# Patient Record
Sex: Male | Born: 1935 | Race: White | Hispanic: No | Smoking: Former smoker
Health system: Southern US, Community
[De-identification: ages and names within clinical notes are randomized; demographics above are authoritative.]

## PROBLEM LIST (undated history)

## (undated) DIAGNOSIS — G4733 Obstructive sleep apnea (adult) (pediatric): Secondary | ICD-10-CM

## (undated) DIAGNOSIS — I350 Nonrheumatic aortic (valve) stenosis: Secondary | ICD-10-CM

## (undated) DIAGNOSIS — R42 Dizziness and giddiness: Secondary | ICD-10-CM

## (undated) DIAGNOSIS — R5383 Other fatigue: Secondary | ICD-10-CM

## (undated) DIAGNOSIS — J309 Allergic rhinitis, unspecified: Secondary | ICD-10-CM

## (undated) DIAGNOSIS — Z9981 Dependence on supplemental oxygen: Secondary | ICD-10-CM

## (undated) DIAGNOSIS — K625 Hemorrhage of anus and rectum: Secondary | ICD-10-CM

## (undated) DIAGNOSIS — J449 Chronic obstructive pulmonary disease, unspecified: Secondary | ICD-10-CM

## (undated) DIAGNOSIS — E119 Type 2 diabetes mellitus without complications: Secondary | ICD-10-CM

## (undated) DIAGNOSIS — E1140 Type 2 diabetes mellitus with diabetic neuropathy, unspecified: Secondary | ICD-10-CM

## (undated) DIAGNOSIS — F419 Anxiety disorder, unspecified: Secondary | ICD-10-CM

## (undated) DIAGNOSIS — I509 Heart failure, unspecified: Secondary | ICD-10-CM

## (undated) DIAGNOSIS — C61 Malignant neoplasm of prostate: Secondary | ICD-10-CM

## (undated) DIAGNOSIS — I10 Essential (primary) hypertension: Secondary | ICD-10-CM

## (undated) DIAGNOSIS — G4700 Insomnia, unspecified: Secondary | ICD-10-CM

## (undated) DIAGNOSIS — E785 Hyperlipidemia, unspecified: Secondary | ICD-10-CM

## (undated) DIAGNOSIS — I4891 Unspecified atrial fibrillation: Secondary | ICD-10-CM

## (undated) DIAGNOSIS — F319 Bipolar disorder, unspecified: Secondary | ICD-10-CM

## (undated) DIAGNOSIS — M109 Gout, unspecified: Secondary | ICD-10-CM

## (undated) DIAGNOSIS — D649 Anemia, unspecified: Secondary | ICD-10-CM

## (undated) DIAGNOSIS — E669 Obesity, unspecified: Secondary | ICD-10-CM

## (undated) DIAGNOSIS — C182 Malignant neoplasm of ascending colon: Secondary | ICD-10-CM

## (undated) HISTORY — DX: Obesity, unspecified: E66.9

## (undated) HISTORY — DX: Dizziness and giddiness: R42

## (undated) HISTORY — PX: PROSTATE BIOPSY: SHX241

## (undated) HISTORY — PX: KNEE SURGERY: SHX244

## (undated) HISTORY — DX: Nonrheumatic aortic (valve) stenosis: I35.0

## (undated) HISTORY — DX: Type 2 diabetes mellitus without complications: E11.9

## (undated) HISTORY — DX: Essential (primary) hypertension: I10

## (undated) HISTORY — DX: Hyperlipidemia, unspecified: E78.5

## (undated) HISTORY — PX: CARDIOVERSION: SHX1299

## (undated) HISTORY — DX: Chronic obstructive pulmonary disease, unspecified: J44.9

## (undated) HISTORY — DX: Bipolar disorder, unspecified: F31.9

## (undated) HISTORY — DX: Obstructive sleep apnea (adult) (pediatric): G47.33

## (undated) HISTORY — DX: Anxiety disorder, unspecified: F41.9

## (undated) HISTORY — DX: Insomnia, unspecified: G47.00

## (undated) HISTORY — DX: Type 2 diabetes mellitus with diabetic neuropathy, unspecified: E11.40

## (undated) HISTORY — DX: Unspecified atrial fibrillation: I48.91

## (undated) HISTORY — DX: Allergic rhinitis, unspecified: J30.9

## (undated) HISTORY — PX: SHOULDER SURGERY: SHX246

---

## 1967-02-02 HISTORY — PX: OTHER SURGICAL HISTORY: SHX169

## 1989-02-01 DIAGNOSIS — I4891 Unspecified atrial fibrillation: Secondary | ICD-10-CM

## 1989-02-01 HISTORY — DX: Unspecified atrial fibrillation: I48.91

## 1996-02-02 HISTORY — PX: MAZE: SHX5063

## 1999-08-14 ENCOUNTER — Encounter: Payer: Self-pay | Admitting: Specialist

## 1999-08-17 ENCOUNTER — Encounter (INDEPENDENT_AMBULATORY_CARE_PROVIDER_SITE_OTHER): Payer: Self-pay | Admitting: Specialist

## 1999-08-17 ENCOUNTER — Observation Stay (HOSPITAL_COMMUNITY): Admission: RE | Admit: 1999-08-17 | Discharge: 1999-08-18 | Payer: Self-pay | Admitting: Specialist

## 2003-05-22 ENCOUNTER — Encounter: Admission: RE | Admit: 2003-05-22 | Discharge: 2003-05-22 | Payer: Self-pay | Admitting: Internal Medicine

## 2003-08-28 ENCOUNTER — Ambulatory Visit (HOSPITAL_BASED_OUTPATIENT_CLINIC_OR_DEPARTMENT_OTHER): Admission: RE | Admit: 2003-08-28 | Discharge: 2003-08-28 | Payer: Self-pay | Admitting: Internal Medicine

## 2005-07-21 ENCOUNTER — Encounter: Admission: RE | Admit: 2005-07-21 | Discharge: 2005-07-21 | Payer: Self-pay | Admitting: Internal Medicine

## 2006-02-18 ENCOUNTER — Ambulatory Visit: Payer: Self-pay | Admitting: Internal Medicine

## 2006-09-20 ENCOUNTER — Ambulatory Visit: Payer: Self-pay | Admitting: Internal Medicine

## 2006-10-07 ENCOUNTER — Ambulatory Visit: Payer: Self-pay | Admitting: Internal Medicine

## 2007-03-13 DIAGNOSIS — I482 Chronic atrial fibrillation, unspecified: Secondary | ICD-10-CM | POA: Insufficient documentation

## 2007-03-13 DIAGNOSIS — G4733 Obstructive sleep apnea (adult) (pediatric): Secondary | ICD-10-CM

## 2007-03-13 DIAGNOSIS — J449 Chronic obstructive pulmonary disease, unspecified: Secondary | ICD-10-CM

## 2007-03-13 DIAGNOSIS — E119 Type 2 diabetes mellitus without complications: Secondary | ICD-10-CM | POA: Insufficient documentation

## 2007-03-15 ENCOUNTER — Encounter: Payer: Self-pay | Admitting: Internal Medicine

## 2007-09-14 ENCOUNTER — Encounter: Payer: Self-pay | Admitting: Internal Medicine

## 2009-01-02 ENCOUNTER — Telehealth (INDEPENDENT_AMBULATORY_CARE_PROVIDER_SITE_OTHER): Payer: Self-pay | Admitting: *Deleted

## 2009-01-08 ENCOUNTER — Ambulatory Visit: Payer: Self-pay | Admitting: Internal Medicine

## 2009-01-08 DIAGNOSIS — G4730 Sleep apnea, unspecified: Secondary | ICD-10-CM | POA: Insufficient documentation

## 2009-01-16 ENCOUNTER — Encounter: Payer: Self-pay | Admitting: Internal Medicine

## 2009-01-21 ENCOUNTER — Encounter: Payer: Self-pay | Admitting: Internal Medicine

## 2009-01-21 ENCOUNTER — Ambulatory Visit (HOSPITAL_BASED_OUTPATIENT_CLINIC_OR_DEPARTMENT_OTHER): Admission: RE | Admit: 2009-01-21 | Discharge: 2009-01-21 | Payer: Self-pay | Admitting: Internal Medicine

## 2009-02-01 ENCOUNTER — Ambulatory Visit: Payer: Self-pay | Admitting: Internal Medicine

## 2009-06-23 ENCOUNTER — Telehealth (INDEPENDENT_AMBULATORY_CARE_PROVIDER_SITE_OTHER): Payer: Self-pay | Admitting: *Deleted

## 2009-06-24 ENCOUNTER — Ambulatory Visit: Payer: Self-pay | Admitting: Internal Medicine

## 2009-06-24 DIAGNOSIS — J309 Allergic rhinitis, unspecified: Secondary | ICD-10-CM | POA: Insufficient documentation

## 2009-08-12 ENCOUNTER — Telehealth: Payer: Self-pay | Admitting: Internal Medicine

## 2009-11-07 ENCOUNTER — Encounter: Admission: RE | Admit: 2009-11-07 | Discharge: 2009-11-07 | Payer: Self-pay | Admitting: Internal Medicine

## 2010-02-01 DIAGNOSIS — I350 Nonrheumatic aortic (valve) stenosis: Secondary | ICD-10-CM

## 2010-02-01 HISTORY — DX: Nonrheumatic aortic (valve) stenosis: I35.0

## 2010-02-22 ENCOUNTER — Encounter: Payer: Self-pay | Admitting: Internal Medicine

## 2010-03-03 NOTE — Progress Notes (Signed)
Summary: can't wait for appt  Phone Note Call from Patient Call back at [phone]   Caller: Spouse- Lerry Liner Reason for Call: Talk to Nurse Summary of Call: pt dizzy, complains alot about the pollen, nausea.  Has had complete heart w/u.  Pt's wife doesn't feel he can wait until appt tomorrow. Initial call taken by: Eugene Gavia,  Jun 23, 2009 12:03 PM  Follow-up for Phone Call        [facility] x 1 Zackery B[facility]  Jun 23, 2009 12:19 PM   Pt seen today.Reynaldo Minium CMA  Jun 24, 2009 2:42 PM

## 2010-03-03 NOTE — Progress Notes (Signed)
Summary: Please order new BiPAP machine  Phone Note Other Incoming   Summary of Call: Need relacement script BIPAP 18/12, which is where he is still comfortable. Initial call taken by: Waymon Budge MD,  August 12, 2009 7:25 PM  Follow-up for Phone Call        Order p/u by Mayra Reel for [facility] for replacement bipap 18/12. Pt is aware. Rhonda Cobb  August 15, 2009 5:23 PM     New/Updated Medications: * BIPAP 18 / 12 ADVANCED

## 2010-03-03 NOTE — Assessment & Plan Note (Signed)
Summary: ALLERGY PROBLEM/ MBW   CC:  Allergies-cough-brown in color; sneezing, wheezing, SOB, nausea, and ? vertigo(dizzy)..  History of Present Illness: January 08, 2009- OSA, Chronic bronchitis, rhinitis He gets chest congestion- worse in Spring. This Fall he has been congested, coughing. No relief from zpak then amoxacillin. Yellow brown mucus. Hard to cough it out. Using mucinex DM. Prednisone overstimulates. Not using any inhalers. Long talk about Wife's illness. OSA- BiPAP18/14. Advanced. he needs a new sleep study because of weight gain and to document diagnosis and pressure recommendation. Had flu vax. Had a single pneumovax years ago  Jun 24, 2009- OSA, Chronic bronchitis, rhinitis..............................Marland Kitchenwife here 1 month head congestion, nasal congestion, wheeze and sneeze, brownish sputum and some episodic vertigo. Sore throat without fever. Anterior chest pain- he saw Dr Ty Hilts for cardiology with negative workup.-> "chest wall pain". We reiewed his sleep study from last December- confirmed severe OSA 102/hr, with titration to I21/ E 12. He is comfortable still with BiPAP I 18/ E 12. He uses it every night and sleeps 10 hours. His wife confirms no snore through.  Skin itches without rash.  Preventive Screening-Counseling & Management  Alcohol-Tobacco     Smoking Status: quit > 6 months  Current Medications (verified): 1)  Metformin Hcl 500 Mg  Tabs (Metformin Hcl) 2)  Glyburide 5 Mg  Tabs (Glyburide) .... Ck Dosing With Pt 3)  Diovan 160 Mg  Tabs (Valsartan) .... Take 1 By Mouth Once Daily 4)  Lasix 40 Mg  Tabs (Furosemide) .... Take 1 By Mouth Once Daily 5)  K-Dur 6)  Adult Aspirin Low Strength 81 Mg  Tbdp (Aspirin) .... Take 1 By Mouth Once Daily 7)  Multivitamins   Tabs (Multiple Vitamin) .... Take 1 By Mouth Once Daily 8)  Qvar 80 Mcg/act  Aers (Beclomethasone Dipropionate) .Marland Kitchen.. 1 Puff Two Times A Day 9)  Bi Pap [place] .... Set On 18/ 14 10)  Ambien .... As  Needed Ck Dosing With Pt 11)  Lexapro 10 Mg Tabs (Escitalopram Oxalate) .... Take 1 By Mouth Once Daily 12)  Lovastatin 20 Mg Tabs (Lovastatin) .... 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  Risk Factors: Smoking Status: quit > 6 months (06/24/2009)  Past Medical History: COPD- FEV1/FVC 0.68 08/15/96 Obstructive Sleep Apnea- Severe- 01/22/09-AHI 102.8/hr Hx A Fib/ multiple cardioversions, Maze procedure Diabetes, Type 2 Hx Bipolar/ Hosp/ ECT  Past Surgical History: Dental extraction Maze procedure for control of atrial fib Right knee surgery Right shoulder  Social History: Married AA x 33 years 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  The patient denies shortness of breath with activity, shortness of breath at rest, productive cough, non-productive cough, coughing up blood, chest pain, irregular heartbeats, acid heartburn, indigestion, loss of appetite, weight change, abdominal pain, difficulty swallowing, sore throat, tooth/dental problems, and headaches.    Vital Signs:  Patient profile:   77 year old male Weight:      275 pounds O2 Sat:      96 % on Room air Pulse rate:   74 / minute BP sitting:   144 / 82  (left arm) Cuff  size:   large  Vitals Entered By: Reynaldo Minium CMA (Jun 24, 2009 1:57 PM)  O2 Flow:  Room air  Physical Exam  Additional Exam:  General: A/Ox3; pleasant and cooperative, NAD, obese  SKIN: no rash, lesions NODES: no lymphadenopathy HEENT: [place]/AT, EOM- WNL, Conjuctivae- clear, PERRLA, TM-WNL, Nose- clear, Throat- clear and wnl, Mallampati IV, dentures NECK: Supple w/ fair ROM, JVD- none, normal carotid  impulses w/o bruits Thyroid- normal to palpation CHEST: No wheeze HEART: RRR, no m/g/r heard ABDOMEN: Soft and nl; ZOX:WRUE, nl pulses, no edema  NEURO: Grossly intact to observation, fidgety      Impression & Recommendations:  Problem # 1:  ALLERGIC RHINITIS (ICD-477.9)  We discusseed his diabetes and decided to accept the sugar surge of a depo shot to get good control of rhinitis now. We also discussed available antihistamines.  Problem # 2:  SLEEP APNEA (ICD-780.57) We reviewed his last sleep study demonstrating his severe sleep apnea. He is very compliant and well controlled with bipap. we will leave the pressure where it is.  Other Orders: Est. Patient Level IV (45409) Admin of Therapeutic Inj  intramuscular or subcutaneous (81191) Depo- Medrol 80mg  (J1040) Nebulizer Tx (47829)  Patient Instructions: 1)  Please schedule a follow-up appointment in 1 year. 2)  neb neo nasal 3)  depo 80 4)  continue BiPAP at 18/14 5)  Consider otc antihistamines 6)  Claritin/ loratadine 7)  Allegra/ fexofenadine      Medication Administration  Injection # 1:    Medication: Depo- Medrol 80mg     Diagnosis: ALLERGIC RHINITIS (ICD-477.9)    Route: SQ    Site: LUOQ gluteus    Exp Date: 12/2011    Lot #: [id number]    Mfr: Pharmacia    Patient tolerated injection without complications    Given by: Reynaldo Minium CMA (Jun 24, 2009 2:42 PM)  Medication # 1:    Medication: EMR miscellaneous medications    Diagnosis: ALLERGIC RHINITIS (ICD-477.9)    Dose: 3drops    Route: intranasal    Exp Date: 11/2010    Lot #: [id number]    Mfr: bayer    Comments: neo-synephrine    Patient tolerated medication without complications    Given by: Reynaldo Minium CMA (Jun 24, 2009 2:43 PM)  Orders Added: 1)  Est. Patient Level IV [08657] 2)  Admin of Therapeutic Inj  intramuscular or subcutaneous [96372] 3)  Depo- Medrol 80mg  [J1040] 4)  Nebulizer Tx [84696]

## 2010-06-16 NOTE — Assessment & Plan Note (Signed)
[facility]                             PULMONARY OFFICE NOTE   CORNELLIUS, Joel [name]                       MRN:          [id number]  DATE:09/20/2006                            DOB:          1932/10/07    PROBLEM:  1. Obstructive sleep apnea.  2. Chronic bronchitis.  3. Diabetes.   HISTORY:  He feels he and his wife have been trading a congestion cough  back and forth. He had been treated by Dr.  Donette Larry. Sputum was scant  yellow with no fever. Nebulized albuterol helps. Mucinex maybe does not.  He quit walking and complains today mainly of nasal congestion without  pain, blood or purulent discharge currently. He says Dr.  Donette Larry did a  chest x-ray-fine. He denies any sense of heartburn or reflux.   MEDICATIONS:  1. Metformin 500 mg.  2. Glyburide.  3. Diovan 160.  4. Lasix 40 mg.  5. Potassium.  6. Aspirin 81 mg.  7. He uses Bi-PAP inspiratory 18 and expiratory 14.   DRUG INTOLERANCES:  No medication allergy.   OBJECTIVE:  Weight 282 pounds, blood pressure 128/58, pulse 103.  Room  air saturation 94%. This is an obese man with regular heart sounds and  raspy cough, but no wheeze and no edema. No adenopathy.   IMPRESSION:  Rhinitis and tracheal bronchitis with history of  gastroesophageal reflux disease.   PLAN:  1. Reflux precautions.  2. Sample of Veramyst one or two sprays each nostril once daily.  3. Qvar 80 two puffs b.i.d. rinse mouth.  4. Prilosec over-the-counter.  5. Schedule return in three weeks or one year. Come earlier p.r.n.    Joel D. Maple Hudson, MD, Tonny Bollman, FACP  Electronically Signed   CDY/MedQ  DD: 09/21/2006  DT: 09/22/2006  Job #: [id number]   cc:   Georgann Housekeeper, MD

## 2010-06-16 NOTE — Assessment & Plan Note (Signed)
[facility]                             PULMONARY OFFICE NOTE   NAME:STUTTSNihal, Marzella                       MRN:          [id number]  DATE:10/07/2006                            DOB:          [birth year]    PROBLEMS:  1. Obstructive sleep apnea.  2. Chronic bronchitis.  3. Diabetes.  4. History of atrial fibrillation.   HISTORY:  He says that he is depressed, is tired, and has no energy, but  he insists that he is comfortable with BiPAP at 18/14 CWP. He had a  cough bronchitis syndrome which he says is slowly breaking up, and his  nebulized albuterol does help. Bedtime is around 11. He is up around  5:30am. Ambien helps. It worries him some that he feels he needs it  every night giving a past history of ethanol and diazepam dependency,  and a past history of bipolar disorder. We discussed this and we  discussed alternatives. He says that he wakes tired with no energy. Naps  are not especially refreshing.   MEDICATIONS:  1. Metformin 500 mg.  2. Glyburide.  3. Diovan 160 mg.  4. Lasix 40 mg.  5. Potassium.  6. Multivitamin.  7. Aspirin 81 mg.  8. BiPAP 18/14.  9. Home nebulizer with albuterol.  10.Generic Ambien.  11.Veramyst once each nostril daily.  12.Qvar 80 1 puff b.i.d.   Some days he will use his albuterol as much as three times daily. He  wants a rescue inhaler and we discussed this.   OBJECTIVE:  Weight 283 pounds, pulse 70, room air saturation 95%. He is  quite obese. Loose cough without dullness or rhonchi. Heart sounds are  regular without murmur. There is no edema. No tremor. No pressure marks  on his face from the mask.   IMPRESSION:  Depression/fatigue. Otherwise, I do not get clues that his  sleep apnea control is inadequate. This may be residual sleepiness or  all related to depression. We have discussed sleep hygiene again and  realistic expectations emphasizing the importance of follow up with Dr.  Donette Larry.   PLAN:  1. He will continue his respiratory medications.  2. He is given albuterol rescue inhaler 2 puffs q.i.d. p.r.n.  3. Try Provigil 200 mg samples and prescription 1 daily as discussed      very carefully with him, p.r.n.  4. Schedule return 3 months, earlier p.r.n.     Clinton D. Maple Hudson, MD, Tonny Bollman, FACP  Electronically Signed    CDY/MedQ  DD: 10/08/2006  DT: 10/09/2006  Job #: [id number]   cc:   Georgann Housekeeper, MD

## 2010-06-17 ENCOUNTER — Encounter: Payer: Self-pay | Admitting: Internal Medicine

## 2010-06-19 NOTE — H&P (Signed)
[facility]  Patient:    Joel [name], Joel [name]                         MRN: [id number] Adm. Date:  08/17/99 Attending:  Philips J. Montez Morita, M.D. CC:         Lum Babe, M.D.                         History and Physical  DATE OF BIRTH:  10/25/1932  CHIEF COMPLAINT:  "Pain in my right shoulder."  HISTORY OF PRESENT ILLNESS:  This 77 year old white male is seen by [place] for continuing problems concerning his right shoulder.  The patient was to have had  surgery done in June of this year; however, he had to be cancelled, due to the act that he had atrial fibrillation.  A [place] procedure was done at [facility] for the fibrillation, and he has done quite well since then.  He is now scheduled for a distal clavicle resection of the right shoulder.  The patient has failed ith conservative measures, including injections to this area.  He has nearly constant persistent pain.  Dr. Lum Babe has cleared him for surgery.  PAST MEDICAL HISTORY:  Other than the thoracotomy, with myocardial ablation for  atrial fibrillation, the patient has had no previous surgeries.  Sleep apnea.  CURRENT MEDICATIONS: 1. Celebrex. 2. He does have sleep apnea and uses a CPAP machine.  ALLERGIES:  No known drug allergies.  SOCIAL HISTORY:  He neither smokes nor drinks.  FAMILY HISTORY:  Noncontributory.  REVIEW OF SYSTEMS:  CNS:  No seizure disorder, paralysis, numbness, or double vision.  RESPIRATORY:  No productive cough, no hemoptysis, no shortness of breath. CARDIOVASCULAR:  No chest pain, no angina, no orthopnea.  He does have sleep apnea. GASTROINTESTINAL:  No nausea, vomiting, melena, or bloody stools. GENITOURINARY: No discharge, no dysuria, no hematuria.  MUSCULOSKELETAL:  Primarily in the history of present illness, in the right shoulder, with pain in range of motion, specifically on extension.  PHYSICAL EXAMINATION:  GENERAL:  Alert, cooperative,  friendly, obese 77 year old white male.  VITAL SIGNS:  Blood pressure 140/80, pulse 72, respirations 12.  HEENT:  Normocephalic.  Pupils equal, round, reactive to light and accommodation. Oropharynx clear.  CHEST:  Clear to auscultation.  No rhonchi or rales.  HEART:  A regular rate and rhythm.  No murmurs are heard.  ABDOMEN:  Soft, nontender, obese.  Liver and spleen not felt.  GENITOURINARY/RECTAL:  Not done, not pertinent to the present illness.  EXTREMITIES:  The patient has good neurovascular function of the right upper extremity.  Tenderness to palpation over the [facility] joint.  He constantly moves the  right shoulder, to relieve the [facility] arthrosis discomfort.  ADMISSION DIAGNOSES: 1. Right shoulder acromioclavicular joint arthritis. 2. Sleep apnea. 3. History of a thoracotomy for atrial fibrillation ablation.  PLAN:  The patient will undergo a right distal clavicle resection.  We decided o do him as an inpatient, due to his sleep apnea and his cardiac history.  He has  been cleared by Dr. Demetrius Revel. DD:  08/14/99 TD:  08/14/99 Job: [id number]

## 2010-06-19 NOTE — Assessment & Plan Note (Signed)
[facility]                             PULMONARY OFFICE NOTE   KRISTINA, BERTONE                       MRN:          [id number]  DATE:02/18/2006                            DOB:          [birth year]    PULMONARY/SLEEP MEDICINE FOLLOWUP   PROBLEMS:  1. Obstructive sleep apnea.  2. Chronic bronchitis.  3. Diabetes.   HISTORY:  Mr. Abruzzo is a patient from the [facility] practice, coming now to establish here for continuity followup  of his sleep apnea.  He was originally diagnosed in 1993 with an apnea  hypopnea index of 28 per hour and for most of the years since has been  dependent on BiPAP, which has been at an inspiratory pressure of 18,  expiratory pressure of 14.  Home care company is [facility].  He says that this  pressure works well and he uses it every night.  He had not had adequate  control with CPAP.  In the past month, he has had congestion of nose and  chest with watering eyes.  He took some leftover amoxicillin, then took  10 days of Avelox for a diagnosis of bronchitis and pneumonia.  Antibiotics ended 5 days ago.  He was given Phenergan with codeine, but  he states he is a drug addict and does not want to take anything with  codeine, so he is using Delsym.  He denies fever.  His wife has not been  acutely ill.  He thinks the trigger was exposure on a sustained basis to  second hand smoke just before Christmas.  He describes pushing himself  hard at a gymnasium where he lifts weights, and he got too tired out  after that exposure.  He also walks at 1 to 2 miles a day.  He has not  had chest pain or purulent sputum, and what he coughs up is white.   MEDICATIONS:  1. Metformin 500 mg.  2. Glyburide 1/2 tablet.  3. Diovan 160 mg.  4. Lasix 40 mg.  5. Potassium.  6. Multivitamins.  7. Aspirin 81 mg.  8. BiPAP 18/14.   No medication allergy.   OBJECTIVE:  Weight 264 pounds, BP 148/74, pulse regular 79, room  air  saturation 96%.  HEART:  Sounds are regular.  There is no murmur.  No neck vein  distension and no edema.  He has a congested wheezy cough without dullness.   IMPRESSION:  1. Stable control of sleep apnea with BiPAP at 18/14 ([facility]).  2. Acute bronchitis.   PLAN:  1. He is given a nebulizer treatment here, Xopenex 1.25 mg.  2. Depo-Medrol 80 mg IM.  3. Prednisone 8 day taper from 40 mg.  4. A careful discussion of steroid side effects especially in the      context of his diabetes.  5. Chest x-ray.  6. Schedule return in 3 weeks, earlier p.r.n.     Clinton D. Maple Hudson, MD, Tonny Bollman, FACP  Electronically Signed    CDY/MedQ  DD: 02/20/2006  DT: 02/20/2006  Job #: [id number]   cc:  Georgann Housekeeper, MD

## 2010-06-19 NOTE — Procedures (Signed)
NAME:  Joel [name], Joel [name]              ACCOUNT NO.:  [id number]   MEDICAL RECORD NO.:  [id number]          PATIENT TYPE:  OUT   LOCATION:  SLEEP CENTER                 FACILITY:  [facility]   PHYSICIAN:  Joel [name], M.D. DATE OF BIRTH:  [birth year]   DATE OF ADMISSION:  08/28/2003  DATE OF DISCHARGE:  08/28/2003                              NOCTURNAL POLYSOMNOGRAM   REFERRING PHYSICIAN:  Clinton D. Maple [name], M.D.   INDICATION FOR STUDY/HISTORY:  Hypersomnia with sleep apnea.  BiPAP  titration requested to confirm best setting.  He has been using inspiratory  pressure of 7, expiratory pressure of 5 following initial diagnostic NPSG on  March 12, 1991 when RDI was 28/hr.   EPWORTH SLEEPINESS SCORE:  10/24   BODY MASS INDEX:  32   WEIGHT:  260 pounds   MEDICATIONS INCLUDE:  Diovan, K-Dur, glucosamine, furosemide, Ambien, and  multivitamins.   SLEEP ARCHITECTURE:  Sleep onset was at 10:10 p.m.  Total sleep time 437  minutes for a sleep efficiency of 93%.  Stage I was 5%, Stage II 80%, Stages  III and IV were absent, REM was 15% of total sleep time.  Latency to sleep  onset 8 minutes, latency to REM 86 minutes, awake after sleep onset 24  minutes, arousal index 10.6/hr.   RESPIRATORY DATA:  Titration was started with inspiratory pressure of 7,  expiratory pressure of 5 obtaining an RDI at 4.3/hr with maximum RDI during  the study of 75.8/hr and eventual control at pressure of 18/14 with an RDI  of 5.7/hr which was said to give good toleration and control using a large  full face ResMed mask with heated humidifier.   OXYGEN DATA:  Baseline room air oxygen saturation while awake was 94%.  Oxygen saturation was well controlled with a nadir of 80% before full BiPAP  control and a mean oxygen saturation through the study between 93 and 94%.  Snoring was eliminated.   CARDIAC DATA:  Normal sinus rhythm with occasional PVC.   MOVEMENT/PARASOMNIA:  Very frequent body jerks, 644 recorded,  but only 1 of  these was associated with sleep disturbance.   IMPRESSION/RECOMMENDATION:  Obstructive sleep apnea/hypopnea syndrome with  control using bilevel positive airway pressure inspiratory pressure  18/expiratory pressure 14, Respiratory Disturbance Index 5.7/hr which  eliminated snoring, controlled oxygenation and was well tolerated.                                   ______________________________                                Joel [name], M.D.                                Diplomate, American Board of Sleep Medicine    CDY/MEDQ  D:  09/01/2003 13:16:09  T:  09/02/2003 08:22:45  Job:  [id number]

## 2010-06-19 NOTE — Op Note (Signed)
[facility]  Patient:    KHYRIE, MASI                         MRN: [id number] Proc. Date: 08/17/99 Attending:  Philips J. Montez Morita, M.D.                           Operative Report  PREOPERATIVE DIAGNOSIS:  Right distal clavicle arthritis.  POSTOPERATIVE DIAGNOSIS:  Right distal clavicle arthritis.  OPERATIVE PROCEDURE:  Distal clavicle excision.  SURGEON:  Philips J. Montez Morita, M.D.  PROCEDURE:  After suitable general anesthesia, he was positioned in the shoulder frame and prepped and draped routine.  A linear incision was made over the distal end of the clavicle and the soft tissue was freed up anteriorly and posteriorly into the [facility] joint and using a saw, a half inch of the distal clavicle was excised.  Bone wax was used on the ends and a Gelfoam pack soaked in 0.5% Marcaine was placed in the interval and then the sutures reapproximating the periosteum and muscle of #1 PCV, ______ material in the subcu and a Vicryl 4-0 subcuticular closure with Steri-Strips.  Wound was quite dry at the end.  A Bovie was used throughout.  He was put in a sling and goes to recovery in good condition. DD:  08/17/99 TD:  08/17/99 Job: [id number]

## 2010-06-23 ENCOUNTER — Ambulatory Visit: Payer: Self-pay | Admitting: Internal Medicine

## 2010-07-10 ENCOUNTER — Telehealth: Payer: Self-pay | Admitting: Internal Medicine

## 2010-07-10 NOTE — Telephone Encounter (Signed)
Called spoke with patient who states that bipap had stopped working last night and when he went to [facility] today and got a new bipap but was told he needs an order for this.  Pt is also overdue for follow up with CDY - appt scheduled 6.11.12 @ 1430.  Pt will discuss this further with CDY at appt.

## 2010-07-13 ENCOUNTER — Encounter: Payer: Self-pay | Admitting: Internal Medicine

## 2010-07-13 ENCOUNTER — Ambulatory Visit (INDEPENDENT_AMBULATORY_CARE_PROVIDER_SITE_OTHER): Payer: Medicare Other | Admitting: Internal Medicine

## 2010-07-13 VITALS — BP 144/82 | HR 95 | Ht 73.0 in | Wt 269.8 lb

## 2010-07-13 DIAGNOSIS — G4733 Obstructive sleep apnea (adult) (pediatric): Secondary | ICD-10-CM

## 2010-07-13 DIAGNOSIS — J42 Unspecified chronic bronchitis: Secondary | ICD-10-CM

## 2010-07-13 NOTE — Patient Instructions (Signed)
Continue CPAP at 18/14  Consider trying Biotene - otc- as a way to keep your mouth less dry

## 2010-07-13 NOTE — Progress Notes (Signed)
  Subjective:    Patient ID: MAALIK PINN, male    DOB: 1932-05-22, 77 y.o.   MRN: [id number]  HPI 07/13/10- OSA Last here 06/24/09 His BiPAP machine had done well till it failed. Advanced BIPAP 18/14. We had gotten an update sleep study in 2010 to qualfy for new machine then. Now needs to document face- to - face. He is tolerating new machine well. Feels well rested. Not snoring through now.  Very dry mouth. Uses nasal saline with eucalyptus drops for dry nose.  Review of Systems Constitutional:   No weight loss, night sweats,  Fevers, chills, fatigue, lassitude. HEENT:   No headaches,  Difficulty swallowing,  Tooth/dental problems,  Sore throat,                No sneezing, itching, ear ache, nasal congestion, post nasal drip,   CV:  No chest pain,  Orthopnea, PND, swelling in lower extremities, anasarca, dizziness, palpitations  GI  No heartburn, indigestion, abdominal pain, nausea, vomiting, diarrhea, change in bowel habits, loss of appetite  Resp: No shortness of breath with exertion or at rest.  No excess mucus, no productive cough,  No non-productive cough,  No coughing up of blood.  No change in color of mucus.  No wheezing.   Skin: no rash or lesions.  GU: no dysuria, change in color of urine, no urgency or frequency.  No flank pain.  MS:  No joint pain or swelling.  No decreased range of motion.  No back pain.  Psych:  No change in mood or affect. No depression or anxiety.  No memory loss.      Objective:   Physical Exam General- Alert, Oriented, Affect-appropriate, Distress- none acute   obese  Skin- rash-none, lesions- none, excoriation- none  Lymphadenopathy- none  Head- atraumatic  Eyes- Gross vision intact, PERRLA, conjunctivae clear, secretions  Ears- Hearing, canals, Tm - normal  Nose- Clear, No- Septal dev, mucus, polyps, erosion, perforation   Throat- Mallampati III,        mucosa extremely dry,        drainage- none, tonsils- atrophic   dentures  Neck-  flexible , trachea midline, no stridor , thyroid nl, carotid no bruit  Chest - symmetrical excursion , unlabored     Heart/CV- RRR , no murmur , no gallop  , no rub, nl s1 s2                     - JVD- none , edema- none, stasis changes- none, varices- none     Lung- clear to P&A, wheeze- none, cough- none , dullness-none, rub- none     Chest wall-  Abd- tender-no, distended-no, bowel sounds-present, HSM- no  Br/ Gen/ Rectal- Not done, not indicated  Extrem- cyanosis- none, clubbing, none, atrophy- none, strength- nl  Neuro- grossly intact to observation         Assessment & Plan:

## 2010-07-13 NOTE — Assessment & Plan Note (Signed)
Good compliance and control with BiPAP 18/14 He didn't tolerate CPAP with high pressures

## 2010-07-17 ENCOUNTER — Encounter: Payer: Self-pay | Admitting: Internal Medicine

## 2010-07-17 NOTE — Assessment & Plan Note (Signed)
Well controlled at present

## 2010-09-14 ENCOUNTER — Inpatient Hospital Stay (HOSPITAL_COMMUNITY)
Admission: EM | Admit: 2010-09-14 | Discharge: 2010-09-16 | DRG: 313 | Disposition: A | Discharge status: Home / Self Care | Payer: Medicare Other | Attending: Internal Medicine | Admitting: Internal Medicine

## 2010-09-14 ENCOUNTER — Emergency Department (HOSPITAL_COMMUNITY): Payer: Medicare Other

## 2010-09-14 DIAGNOSIS — E7800 Pure hypercholesterolemia, unspecified: Secondary | ICD-10-CM | POA: Diagnosis present

## 2010-09-14 DIAGNOSIS — I4891 Unspecified atrial fibrillation: Secondary | ICD-10-CM | POA: Diagnosis present

## 2010-09-14 DIAGNOSIS — R0789 Other chest pain: Principal | ICD-10-CM | POA: Diagnosis present

## 2010-09-14 DIAGNOSIS — Z7901 Long term (current) use of anticoagulants: Secondary | ICD-10-CM

## 2010-09-14 DIAGNOSIS — J449 Chronic obstructive pulmonary disease, unspecified: Secondary | ICD-10-CM | POA: Diagnosis present

## 2010-09-14 DIAGNOSIS — I10 Essential (primary) hypertension: Secondary | ICD-10-CM | POA: Diagnosis present

## 2010-09-14 DIAGNOSIS — I70219 Atherosclerosis of native arteries of extremities with intermittent claudication, unspecified extremity: Secondary | ICD-10-CM | POA: Diagnosis present

## 2010-09-14 DIAGNOSIS — Z7982 Long term (current) use of aspirin: Secondary | ICD-10-CM

## 2010-09-14 DIAGNOSIS — Z96659 Presence of unspecified artificial knee joint: Secondary | ICD-10-CM

## 2010-09-14 DIAGNOSIS — F319 Bipolar disorder, unspecified: Secondary | ICD-10-CM | POA: Diagnosis present

## 2010-09-14 DIAGNOSIS — Z87891 Personal history of nicotine dependence: Secondary | ICD-10-CM

## 2010-09-14 DIAGNOSIS — E119 Type 2 diabetes mellitus without complications: Secondary | ICD-10-CM | POA: Diagnosis present

## 2010-09-14 DIAGNOSIS — J4489 Other specified chronic obstructive pulmonary disease: Secondary | ICD-10-CM | POA: Diagnosis present

## 2010-09-14 DIAGNOSIS — G4733 Obstructive sleep apnea (adult) (pediatric): Secondary | ICD-10-CM | POA: Diagnosis present

## 2010-09-14 LAB — CBC
HCT: 39.2 % (ref 39.0–52.0)
MCH: 30.4 pg (ref 26.0–34.0)
MCV: 87.5 fL (ref 78.0–100.0)
RBC: 4.48 MIL/uL (ref 4.22–5.81)
RDW: 13.3 % (ref 11.5–15.5)
WBC: 7.3 10*3/uL (ref 4.0–10.5)

## 2010-09-14 LAB — DIFFERENTIAL
Eosinophils Absolute: 0.2 10*3/uL (ref 0.0–0.7)
Eosinophils Relative: 3 % (ref 0–5)
Lymphocytes Relative: 30 % (ref 12–46)
Lymphs Abs: 2.2 10*3/uL (ref 0.7–4.0)
Monocytes Relative: 11 % (ref 3–12)

## 2010-09-14 LAB — GLUCOSE, CAPILLARY
Glucose-Capillary: 75 mg/dL (ref 70–99)
Glucose-Capillary: 84 mg/dL (ref 70–99)

## 2010-09-14 LAB — BASIC METABOLIC PANEL
BUN: 20 mg/dL (ref 6–23)
CO2: 26 mEq/L (ref 19–32)
Calcium: 9.3 mg/dL (ref 8.4–10.5)
Chloride: 105 mEq/L (ref 96–112)
Creatinine, Ser: 0.94 mg/dL (ref 0.50–1.35)

## 2010-09-14 LAB — TROPONIN I: Troponin I: <0.3 ng/mL (ref <0.30)

## 2010-09-15 ENCOUNTER — Inpatient Hospital Stay (HOSPITAL_COMMUNITY): Payer: Medicare Other

## 2010-09-15 LAB — GLUCOSE, CAPILLARY
Glucose-Capillary: 201 mg/dL — ABNORMAL HIGH (ref 70–99)
Glucose-Capillary: 120 mg/dL — ABNORMAL HIGH (ref 70–99)

## 2010-09-15 LAB — TSH: TSH: 3.299 u[IU]/mL (ref 0.350–4.500)

## 2010-09-15 LAB — COMPREHENSIVE METABOLIC PANEL
ALT: 21 U/L (ref 0–53)
Albumin: 3.6 g/dL (ref 3.5–5.2)
Alkaline Phosphatase: 60 U/L (ref 39–117)
Chloride: 104 mEq/L (ref 96–112)
Glucose, Bld: 155 mg/dL — ABNORMAL HIGH (ref 70–99)
Potassium: 3.5 mEq/L (ref 3.5–5.1)
Sodium: 138 mEq/L (ref 135–145)
Total Bilirubin: 0.5 mg/dL (ref 0.3–1.2)
Total Protein: 7.1 g/dL (ref 6.0–8.3)

## 2010-09-15 LAB — HEMOGLOBIN A1C
Hgb A1c MFr Bld: 8.4 % — ABNORMAL HIGH (ref <5.7)
Mean Plasma Glucose: 194 mg/dL — ABNORMAL HIGH (ref <117)

## 2010-09-15 LAB — CBC
HCT: 37.9 % — ABNORMAL LOW (ref 39.0–52.0)
Hemoglobin: 12.9 g/dL — ABNORMAL LOW (ref 13.0–17.0)
MCHC: 34 g/dL (ref 30.0–36.0)
RDW: 13.3 % (ref 11.5–15.5)
WBC: 7.1 10*3/uL (ref 4.0–10.5)

## 2010-09-15 LAB — LIPID PANEL
HDL: 38 mg/dL — ABNORMAL LOW (ref >39)
LDL Cholesterol: 67 mg/dL (ref 0–99)

## 2010-09-15 LAB — APTT: aPTT: 34 seconds (ref 24–37)

## 2010-09-15 LAB — CARDIAC PANEL(CRET KIN+CKTOT+MB+TROPI): Relative Index: INVALID (ref 0.0–2.5)

## 2010-09-15 LAB — PROTIME-INR: Prothrombin Time: 14.3 seconds (ref 11.6–15.2)

## 2010-09-15 LAB — PRO B NATRIURETIC PEPTIDE: Pro B Natriuretic peptide (BNP): 589.1 pg/mL — ABNORMAL HIGH (ref 0–450)

## 2010-09-15 MED ORDER — TECHNETIUM TC 99M TETROFOSMIN IV KIT
10.0000 | PACK | Freq: Once | INTRAVENOUS | Status: AC | PRN
  Administered 2010-09-15: 10 via INTRAVENOUS

## 2010-09-15 MED ORDER — TECHNETIUM TC 99M TETROFOSMIN IV KIT
30.0000 | PACK | Freq: Once | INTRAVENOUS | Status: AC | PRN
  Administered 2010-09-15: 30 via INTRAVENOUS

## 2010-09-16 LAB — PROTIME-INR: INR: 1.16 (ref 0.00–1.49)

## 2010-09-20 NOTE — Discharge Summary (Signed)
NAMESAJAD, GLANDER                ACCOUNT NO.:  [id number]  MEDICAL RECORD NO.:  [id number]  LOCATION:  2004                         FACILITY:  [facility]  PHYSICIAN:  Georgann Housekeeper, MD      DATE OF BIRTH:  21-Feb-1932  DATE OF ADMISSION:  09/14/2010 DATE OF DISCHARGE:  09/16/2010                              DISCHARGE SUMMARY   DISCHARGE DIAGNOSES: 1. Chest pain, rule out myocardial infarction. 2. New onset atrial fibrillation. 3. Previous history of atrial fibrillation with maze procedures many     years ago. 4. History of hypertension. 5. History of diabetes. 6. History of allergies and reactive airway. 7. History of depression.  MEDICATIONS ON DISCHARGE:  Metoprolol 12.5 mg b.i.d. (half of 25 mg tablet), Coumadin 5 mg used as directed with adjustments with INR checks, allopurinol 300 mg daily, Ambien 10 mg half tablet at bedtime, aspirin 81 mg daily, Diovan 160 mg daily, furosemide 40 mg 1 daily, glyburide half tablet 5 mg twice a day, Lexapro 10 mg daily, lovastatin 20 mg daily, metformin 500 mg 1 tablet twice a day, potassium 20 mEq daily Qvar 80 mcg 2 puffs twice a day, stool softener once daily.  FOLLOWUP:  Instructions for PT/INR checked on September 18, 2010, at the [facility] Coumadin clinic and dose adjusted.  After that Coumadin teaching done at the hospital.  The followup appointments with Coumadin Clinic at [facility] on September 18, 2010.  Follow with Dr. Verdis Prime, Cardiology. Followup with me at the office in 1 week.  LABORATORY DATA:  As far as laboratory data is, hemoglobin 12.9, white count 7.1, and platelet count of 158,000.  Chemistries sodium 138, potassium 3.5, and creatinine 1.0.  LFTs normal.  Cardiac markers x3 negative.  Cholesterol 137, LDL of 67.  Thyroid was 3.2.  A1c was 8.4. BNP was 589, mildly elevated.  2D echocardiogram done, results pending. Chest x-ray negative.  Nuclear cardiac stress test negative.  EF of 47%. INR 1.16.  HOSPITAL COURSE:  This is a  77 year old male admitted with atypical chest discomfort symptoms without shortness of breath. 1. Cardiac:  The patient was admitted to telemetry, ruled out for MI     based on the cardiac markers negative.  On telemetry, he was found     to be in atrial fibrillation rate controlled.  The patient had     prior history of maze procedure many years ago for correction of     atrial fibrillation, has been a normal sinus.  He is thought to be     again recurrence and new-onset.  Cardiology was consulted.  The     patient had a nuclear stress test which was negative.  He had a 2D     echocardiogram.  The patient was started on Lovenox subcutaneously     and Coumadin for anticoagulation.  He will switch to Coumadin and     follow up with the Coumadin Clinic to be maintained INR between 2     and 3.  The patient remained in atrial fibrillation rate controlled     without any problems.  Beta-blocker low dose was added.  He will     follow  up with Cardiology as an outpatient.  2.  Hypertension.     Continue on his blood pressure medications.  Diovan, furosemide,     and potassium. 2. Diabetes.  The patient initially had his medication held and the     sliding scale.  The patient will resume his oral glycemic     medications upon discharge including glyburide and metformin.  A1c     was mildly elevated.  We will followup as an outpatient. 3. Depression.  Continue on Lexapro. 4. History of sleep apnea.  He does wear CPAP machine, which we will     continue.  DISCHARGE INSTRUCTIONS:  The patient will followup as mentioned above. I will see him back in 1 week and also will be followed up at the Coumadin Clinic with [facility] for INR check and to get in the range with Coumadin.  Discharge planning time taken over 30 minutes.     Georgann Housekeeper, MD     KH/MEDQ  D:  09/16/2010  T:  09/16/2010  Job:  [id number]  Electronically Signed by Georgann Housekeeper MD on 09/20/2010 11:03:43 PM

## 2010-10-02 NOTE — Consult Note (Signed)
Joel [name], Joel [name]                ACCOUNT NO.:  [id number]  MEDICAL RECORD NO.:  [id number]  LOCATION:  2016                         FACILITY:  [facility]  PHYSICIAN:  Lyn Records, M.D.   DATE OF BIRTH:  1932/03/14  DATE OF CONSULTATION:  09/15/2010 DATE OF DISCHARGE:                                CONSULTATION   REASON FOR CONSULTATION:  Atypical chest pain.  CONCLUSIONS: 1. Atypical chest pain, not felt to represent myocardial ischemia.     Prior heart catheterization in 1997 negative for significant     obstructive disease. 2. History of atrial fibrillation and currently in atrial fibrillation     of unknown duration.     a.     The patient underwent a transthoracic surgical maze      procedure in 2001 at [facility].     b.     Last EKG documented normal sinus rhythm greater than a year      ago. 3. Hypertension. 4. Diabetes mellitus. 5. Bipolar disorder. 6. Chronic obstructive pulmonary disease. 7. Obstructive sleep apnea.     a.     Continuous positive airway pressure.  RECOMMENDATIONS: 1. Lexiscan nuclear perfusion study to rule out evidence of myocardial     ischemia. 2. Two-D Doppler echocardiogram to assess LV function and left atrial     size. 3. IV heparin or subcu Lovenox and transition to Coumadin, Xarelto or     Pradaxa as the patient has a CHADS score greater than 2 based on     hypertension, diabetes, and his age. 4. Serial cardiac markers to rule out myocardial infarction.  COMMENTS:  The patient awakened this morning with relatively local left parasternal discomfort.  It persisted most of the day.  He became concerned and called his primary physician at around 3 p.m.  He could not be worked in and was asked to go to the [employer].  From there, he was sent to the [facility] Emergency Room.  The discomfort is not associated with shortness of breath.  There is no pleuritic component.  The patient denies palpitations and has not had  syncope.  He believes he has maintained normal sinus rhythm since the surgical maze procedure in 2001.  He has been previously seen by Dr. Corliss Marcus, but has not been seen by Cardiology in greater than 9 months.  He denies syncope.  He denies palpitations.  ALLERGIES:  None.  MEDICATIONS:  Metformin, glyburide, Diovan, Lasix, K-Dur, aspirin, multiple vitamins, BiPAP, QVAR, Ambien, Lexapro, and lovastatin.  SOCIAL HISTORY:  Former smoker.  Quit in 1989.  PREVIOUS SURGERIES: 1. Maze procedure in 2001 for atrial fibrillation. 2. Right knee surgery, total replacement. 3. Right shoulder surgery.  OBJECTIVE:  GENERAL:  On exam, the patient is in no acute distress.  He is obese. VITAL SIGNS:  Blood pressure is 150/60, heart rate is 90.  Monitor reveals atrial fibrillation with controlled rate. LUNGS:  Clear. CARDIAC:  No obvious murmur, rub, or click. EXTREMITIES:  No edema.  There is no cyanosis. ABDOMEN:  Soft, although somewhat protuberant. NECK:  No significant JVD and there are no carotid bruits. NEURO:  Grossly intact.  EKG demonstrates atrial fibrillation with controlled ventricular rate at 88 beats per minute with nonspecific ST abnormality noted.  Chest x-ray reveals cardiomegaly and minimal scarring in the right lung periphery.  LABORATORY DATA:  Hemoglobin of 13.6, INR of 1.09, potassium of 3.6.  CK- MB and troponin are normal.  BNP is 589.  LDL cholesterol 67.  DISCUSSION:  The patient presents with atypical chest pain.  He is diabetic and hypertensive.  Cardiac catheterization greater than 15 years ago did not demonstrate any evidence of significant obstructive coronary artery disease prior to his maze procedure.  Despite that information and his atypical presentation, coronary artery disease needs to be excluded due to his risk factors.  This will be done with a pharmacologic nuclear study.  Additionally, the patient is in atrial fibrillation of unknown duration.   His CHADS score is at least 3.  He should be on antithrombotic therapy.  We will initiate Coumadin therapy per pharmacy protocol.     Lyn Records, M.D.     HWS/MEDQ  D:  09/15/2010  T:  09/15/2010  Job:  [id number]  Electronically Signed by Verdis Prime M.D. on 10/02/2010 04:21:54 PM

## 2010-10-08 NOTE — H&P (Signed)
NAMEDEMITRI, Joel [name]                ACCOUNT NO.:  [id number]  MEDICAL RECORD NO.:  [id number]  LOCATION:  [facility]                         FACILITY:  [facility]  PHYSICIAN:  Richarda Overlie, MD       DATE OF BIRTH:  [birth year]  DATE OF ADMISSION:  09/14/2010 DATE OF DISCHARGE:                             HISTORY & PHYSICAL   PRIMARY CARE PHYSICIAN:  Dr. Mikle Bosworth.  SUBJECTIVE:  This is a 77 year old male who presents to the ER with a chief complaint of chest discomfort.  The patient states he has not been feeling well for the last 2 weeks.  He has had intermittent chest pain today.  He started having left arm pain with radiation into his chest. The patient's pain lasted at least a couple of hours.  He could not identify any exacerbating or relieving factors.  He also complained of some dizziness associated with the symptoms.  He denies any episodes of syncope or near syncope.  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  He denies any fever, chills, rigors, or cough.  He denies any history of heartburn, dyspepsia.  Denies any history of nausea, vomiting, abdominal pain, blood in stool, black tarry stool.  The patient has otherwise been feeling well.  He denies any recent evaluation for coronary artery disease.  He states that he does have severe peripheral vascular disease and has claudication in his left lower extremity.  PAST MEDICAL HISTORY:  History of chronic bronchitis, chronic rhinitis, obstructive sleep apnea, diabetes type 2, bipolar disorder, COPD, history of atrial fibrillation, multiple cardioversions with maze procedure.  PAST SURGICAL HISTORY:  History of dental extraction, maze procedure for control of atrial fibrillation, right knee surgery, right shoulder surgery.  SOCIAL HISTORY:  Married.  The patient is a former smoker, quit in 1980s.  REVIEW OF SYSTEMS:  A 14-point  review of systems was done as documented in the HPI.  ALLERGIES:  No known drug allergies.  HOME MEDICATIONS:  Metformin, glyburide, Diovan, Lasix, K-Dur, aspirin, multivitamin, QVAR, BiPAP, Ambien, Lexapro, lovastatin.  FAMILY HISTORY:  Negative for diabetes or hypertension.  PHYSICAL EXAMINATION:  VITAL SIGNS: Blood pressure 162/61, pulse of 87, respirations 16, temperature 97.8. GENERAL:  Comfortable, currently in no acute cardiopulmonary distress. HEENT:  Pupils equal and reactive.  Extraocular movements intact. NECK:  Supple.  No JVD. LUNGS:  Clear to auscultation bilaterally.  No wheezes, crackles, or rhonchi. CARDIOVASCULAR:  Regular rate and rhythm.  No murmurs, rubs, or gallops. ABDOMEN:  Obese, soft, nontender, nondistended. EXTREMITIES:  Without cyanosis, clubbing, or edema. NEUROLOGIC:  Cranial nerves II-XII grossly intact. PSYCHIATRIC:  Appropriate mood and affect.  LABS:  CBC; WBC 7.3, hemoglobin 13.6, hematocrit 39.2, and a platelet count of 154.  BMET; sodium 141, potassium 3.6, chloride 105, bicarb 26, glucose 77, BUN 20, creatinine 0.94, calcium 9.3, troponin less than 0.3.  Chest x-ray shows cardiomegaly, minimal scarring at the right peripheral lung base.  ASSESSMENT AND PLAN: 1. Chest pain, likely reflecting unstable angina given multiple risk     factors with typical symptoms such as  increasing in intensity and     duration of the course of the last 2 weeks. 2. History of severe obstructive sleep apnea, currently on BiPAP. 3. Hypertension. 4. Type 2 diabetes. 5. Morbid obesity. 6. Prior history of nicotine dependence. 7. History of atrial fibrillation, currently not on anticoagulation. 8. History of bipolar disorder.  PLAN:  The patient will be admitted to the telemetry floor.  We will cycle cardiac enzymes to rule out an acute coronary syndrome given his multiple risk factors and history of atrial fibrillation.  We will start him on Lovenox  weight-based.  We will continue him on beta-blocker as well as full-dose aspirin, continue him on statin.  Dr. Verdis Prime has already been notified from [facility] and he will evaluate the patient to see if the patient needs any significant intervention such as a cardiac cath.  During this hospitalization, we will obtain an EKG in the morning, a 2D echo, and also assessing for fasting lipid panel, hemoglobin A1c and a BNP.  The patient does have history of peripheral vascular disease and therefore, we will continue him on a full-dose aspirin.  He does have history of claudication and therefore an ABI has been ordered to reassess his circulation in the left lower extremity. He is a full code.     Richarda Overlie, MD     NA/MEDQ  D:  09/14/2010  T:  09/15/2010  Job:  [id number]  cc:   Dr. Mikle Bosworth  Electronically Signed by Richarda Overlie MD on 10/08/2010 07:07:38 AM

## 2011-02-05 ENCOUNTER — Telehealth: Payer: Self-pay | Admitting: Internal Medicine

## 2011-02-05 MED ORDER — AZITHROMYCIN 250 MG PO TABS: ORAL_TABLET | ORAL | Status: AC

## 2011-02-05 MED ORDER — OSELTAMIVIR PHOSPHATE 75 MG PO CAPS: 75.0000 mg | ORAL_CAPSULE | Freq: Two times a day (BID) | ORAL | Status: AC

## 2011-02-05 NOTE — Telephone Encounter (Signed)
Per Cy-okay to give Tamiflu 75mg  #10 take 1 po bid no refills and Zpak #1 take as directed no refills. I left a message that we have sent things to his pharmacy on file. If any questions or concerns then call the office on Monday. Sent to pharmacy.

## 2011-02-05 NOTE — Telephone Encounter (Signed)
I spoke with pt and he c/o cough w/ green to brown phlem, chest congestion, nasal congestion, PND, chills, body aches, chest tihgtness x couple days. Pt denies any fever, nausea, vomiting, sore throat. He has been taking mucinex and tylenol. He wants something called in for him. Please advise Dr. Maple Hudson, thanks  No Known Allergies   [facility]

## 2011-02-12 DIAGNOSIS — Z7901 Long term (current) use of anticoagulants: Secondary | ICD-10-CM | POA: Diagnosis not present

## 2011-02-12 DIAGNOSIS — I4891 Unspecified atrial fibrillation: Secondary | ICD-10-CM | POA: Diagnosis not present

## 2011-02-23 ENCOUNTER — Telehealth: Payer: Self-pay | Admitting: Internal Medicine

## 2011-02-23 MED ORDER — AMOXICILLIN-POT CLAVULANATE 875-125 MG PO TABS: 1.0000 | ORAL_TABLET | Freq: Two times a day (BID) | ORAL | Status: AC

## 2011-02-23 NOTE — Telephone Encounter (Signed)
I spoke with pt and he c/o sinus congestion, sinus pressure, terrible dry cough, wheezing, low grade fever 99.5-99.9, vertigo, body aches, PND x Sunday night. Denies any chills, sweats, nausea, vomiting. Pt states he took a zpak and tamiflu 02/05/11 and was feeling better. Pt states he is taking mucinex OTC. Pt is requesting an apt or have something called in. Please advise Dr. Maple Hudson, thanks  No Known Allergies

## 2011-02-23 NOTE — Telephone Encounter (Signed)
Pt returned call from triage. Joel [name] °

## 2011-02-23 NOTE — Telephone Encounter (Signed)
I spoke with pt and is aware of cdy recs. He voiced his understanding of the directions and rx has been sent. Nothing further was needed

## 2011-02-23 NOTE — Telephone Encounter (Signed)
lmomtcb x1 for pt 

## 2011-02-23 NOTE — Telephone Encounter (Signed)
Per CY-okay to give Augmentin  875 mg #14 take 1 po bid with 1 refill and Mucinex OTC.

## 2011-03-10 DIAGNOSIS — Z7901 Long term (current) use of anticoagulants: Secondary | ICD-10-CM | POA: Diagnosis not present

## 2011-03-10 DIAGNOSIS — I4891 Unspecified atrial fibrillation: Secondary | ICD-10-CM | POA: Diagnosis not present

## 2011-04-21 DIAGNOSIS — Z7901 Long term (current) use of anticoagulants: Secondary | ICD-10-CM | POA: Diagnosis not present

## 2011-04-21 DIAGNOSIS — I4891 Unspecified atrial fibrillation: Secondary | ICD-10-CM | POA: Diagnosis not present

## 2011-05-13 DIAGNOSIS — E782 Mixed hyperlipidemia: Secondary | ICD-10-CM | POA: Diagnosis not present

## 2011-05-13 DIAGNOSIS — G609 Hereditary and idiopathic neuropathy, unspecified: Secondary | ICD-10-CM | POA: Diagnosis not present

## 2011-05-13 DIAGNOSIS — N182 Chronic kidney disease, stage 2 (mild): Secondary | ICD-10-CM | POA: Diagnosis not present

## 2011-05-13 DIAGNOSIS — I10 Essential (primary) hypertension: Secondary | ICD-10-CM | POA: Diagnosis not present

## 2011-05-13 DIAGNOSIS — R42 Dizziness and giddiness: Secondary | ICD-10-CM | POA: Diagnosis not present

## 2011-05-13 DIAGNOSIS — E1149 Type 2 diabetes mellitus with other diabetic neurological complication: Secondary | ICD-10-CM | POA: Diagnosis not present

## 2011-05-20 ENCOUNTER — Encounter: Payer: Medicare Other | Admitting: Physical Therapy

## 2011-05-21 ENCOUNTER — Encounter: Payer: Medicare Other | Admitting: Rehabilitative and Restorative Service Providers"

## 2011-06-09 DIAGNOSIS — Z7901 Long term (current) use of anticoagulants: Secondary | ICD-10-CM | POA: Diagnosis not present

## 2011-06-09 DIAGNOSIS — I4891 Unspecified atrial fibrillation: Secondary | ICD-10-CM | POA: Diagnosis not present

## 2011-07-22 DIAGNOSIS — Z7901 Long term (current) use of anticoagulants: Secondary | ICD-10-CM | POA: Diagnosis not present

## 2011-07-22 DIAGNOSIS — I4891 Unspecified atrial fibrillation: Secondary | ICD-10-CM | POA: Diagnosis not present

## 2011-08-18 DIAGNOSIS — I4891 Unspecified atrial fibrillation: Secondary | ICD-10-CM | POA: Diagnosis not present

## 2011-08-18 DIAGNOSIS — Z7901 Long term (current) use of anticoagulants: Secondary | ICD-10-CM | POA: Diagnosis not present

## 2011-09-30 DIAGNOSIS — Z7901 Long term (current) use of anticoagulants: Secondary | ICD-10-CM | POA: Diagnosis not present

## 2011-09-30 DIAGNOSIS — I4891 Unspecified atrial fibrillation: Secondary | ICD-10-CM | POA: Diagnosis not present

## 2011-10-12 DIAGNOSIS — E785 Hyperlipidemia, unspecified: Secondary | ICD-10-CM | POA: Diagnosis not present

## 2011-10-12 DIAGNOSIS — Z7901 Long term (current) use of anticoagulants: Secondary | ICD-10-CM | POA: Diagnosis not present

## 2011-10-12 DIAGNOSIS — I10 Essential (primary) hypertension: Secondary | ICD-10-CM | POA: Diagnosis not present

## 2011-10-12 DIAGNOSIS — I4891 Unspecified atrial fibrillation: Secondary | ICD-10-CM | POA: Diagnosis not present

## 2011-10-12 DIAGNOSIS — I359 Nonrheumatic aortic valve disorder, unspecified: Secondary | ICD-10-CM | POA: Diagnosis not present

## 2011-11-15 DIAGNOSIS — Z23 Encounter for immunization: Secondary | ICD-10-CM | POA: Diagnosis not present

## 2011-11-15 DIAGNOSIS — I10 Essential (primary) hypertension: Secondary | ICD-10-CM | POA: Diagnosis not present

## 2011-11-15 DIAGNOSIS — N529 Male erectile dysfunction, unspecified: Secondary | ICD-10-CM | POA: Diagnosis not present

## 2011-11-15 DIAGNOSIS — E782 Mixed hyperlipidemia: Secondary | ICD-10-CM | POA: Diagnosis not present

## 2011-11-15 DIAGNOSIS — E1149 Type 2 diabetes mellitus with other diabetic neurological complication: Secondary | ICD-10-CM | POA: Diagnosis not present

## 2011-11-15 DIAGNOSIS — E669 Obesity, unspecified: Secondary | ICD-10-CM | POA: Diagnosis not present

## 2011-11-15 DIAGNOSIS — G609 Hereditary and idiopathic neuropathy, unspecified: Secondary | ICD-10-CM | POA: Diagnosis not present

## 2011-11-15 DIAGNOSIS — I4891 Unspecified atrial fibrillation: Secondary | ICD-10-CM | POA: Diagnosis not present

## 2011-11-15 DIAGNOSIS — G4730 Sleep apnea, unspecified: Secondary | ICD-10-CM | POA: Diagnosis not present

## 2011-11-16 DIAGNOSIS — Z7901 Long term (current) use of anticoagulants: Secondary | ICD-10-CM | POA: Diagnosis not present

## 2011-11-16 DIAGNOSIS — I4891 Unspecified atrial fibrillation: Secondary | ICD-10-CM | POA: Diagnosis not present

## 2011-11-19 DIAGNOSIS — E291 Testicular hypofunction: Secondary | ICD-10-CM | POA: Diagnosis not present

## 2011-11-26 DIAGNOSIS — N529 Male erectile dysfunction, unspecified: Secondary | ICD-10-CM | POA: Diagnosis not present

## 2011-12-03 DIAGNOSIS — E291 Testicular hypofunction: Secondary | ICD-10-CM | POA: Diagnosis not present

## 2011-12-06 DIAGNOSIS — Z961 Presence of intraocular lens: Secondary | ICD-10-CM | POA: Diagnosis not present

## 2011-12-06 DIAGNOSIS — E119 Type 2 diabetes mellitus without complications: Secondary | ICD-10-CM | POA: Diagnosis not present

## 2011-12-24 DIAGNOSIS — E291 Testicular hypofunction: Secondary | ICD-10-CM | POA: Diagnosis not present

## 2012-02-18 DIAGNOSIS — Z7901 Long term (current) use of anticoagulants: Secondary | ICD-10-CM | POA: Diagnosis not present

## 2012-02-18 DIAGNOSIS — I4891 Unspecified atrial fibrillation: Secondary | ICD-10-CM | POA: Diagnosis not present

## 2012-04-04 DIAGNOSIS — Z7901 Long term (current) use of anticoagulants: Secondary | ICD-10-CM | POA: Diagnosis not present

## 2012-04-04 DIAGNOSIS — I4891 Unspecified atrial fibrillation: Secondary | ICD-10-CM | POA: Diagnosis not present

## 2012-05-15 DIAGNOSIS — G609 Hereditary and idiopathic neuropathy, unspecified: Secondary | ICD-10-CM | POA: Diagnosis not present

## 2012-05-15 DIAGNOSIS — F329 Major depressive disorder, single episode, unspecified: Secondary | ICD-10-CM | POA: Diagnosis not present

## 2012-05-15 DIAGNOSIS — I10 Essential (primary) hypertension: Secondary | ICD-10-CM | POA: Diagnosis not present

## 2012-05-15 DIAGNOSIS — Z1331 Encounter for screening for depression: Secondary | ICD-10-CM | POA: Diagnosis not present

## 2012-05-15 DIAGNOSIS — I4891 Unspecified atrial fibrillation: Secondary | ICD-10-CM | POA: Diagnosis not present

## 2012-05-15 DIAGNOSIS — E291 Testicular hypofunction: Secondary | ICD-10-CM | POA: Diagnosis not present

## 2012-05-15 DIAGNOSIS — E782 Mixed hyperlipidemia: Secondary | ICD-10-CM | POA: Diagnosis not present

## 2012-05-15 DIAGNOSIS — E1149 Type 2 diabetes mellitus with other diabetic neurological complication: Secondary | ICD-10-CM | POA: Diagnosis not present

## 2012-05-22 DIAGNOSIS — Z7901 Long term (current) use of anticoagulants: Secondary | ICD-10-CM | POA: Diagnosis not present

## 2012-05-22 DIAGNOSIS — I4891 Unspecified atrial fibrillation: Secondary | ICD-10-CM | POA: Diagnosis not present

## 2012-06-28 DIAGNOSIS — E291 Testicular hypofunction: Secondary | ICD-10-CM | POA: Diagnosis not present

## 2012-06-28 DIAGNOSIS — M109 Gout, unspecified: Secondary | ICD-10-CM | POA: Diagnosis not present

## 2012-07-12 DIAGNOSIS — E291 Testicular hypofunction: Secondary | ICD-10-CM | POA: Diagnosis not present

## 2012-07-26 DIAGNOSIS — E291 Testicular hypofunction: Secondary | ICD-10-CM | POA: Diagnosis not present

## 2012-08-09 DIAGNOSIS — Z7901 Long term (current) use of anticoagulants: Secondary | ICD-10-CM | POA: Diagnosis not present

## 2012-08-09 DIAGNOSIS — I4891 Unspecified atrial fibrillation: Secondary | ICD-10-CM | POA: Diagnosis not present

## 2012-08-09 DIAGNOSIS — E291 Testicular hypofunction: Secondary | ICD-10-CM | POA: Diagnosis not present

## 2012-08-24 DIAGNOSIS — N529 Male erectile dysfunction, unspecified: Secondary | ICD-10-CM | POA: Diagnosis not present

## 2012-09-04 ENCOUNTER — Telehealth: Payer: Self-pay | Admitting: Internal Medicine

## 2012-09-04 NOTE — Telephone Encounter (Signed)
Pt aware that he needs to make an appt to come in and discuss changes in CPAP/BiPAP Pt would like to discuss making changes to machine and possibly adding O2. Not seen since 2012 and feels he needs either additional pressure or O2 at night. Pt can be reached [phone]. Pt states we can leave a detailed message on machine---pt states that appt can be made and message can be left on machine telling him when to come in.   There are a lot of blocked spots on CY schedule--need to know where patient can be fit in. Please advise Dr Maple Hudson, thanks.

## 2012-09-04 NOTE — Telephone Encounter (Signed)
LMOMTCBX 1 --pt has not been seen since 2012 and told to f/u in 6 months. No pending appt either

## 2012-09-04 NOTE — Telephone Encounter (Signed)
Returning call can be reached at [phone].Joel [name]

## 2012-09-05 NOTE — Telephone Encounter (Signed)
ATC patient, no answer LMOMTCB 

## 2012-09-05 NOTE — Telephone Encounter (Signed)
Per CY-have patient make appointment in next open slot.

## 2012-09-06 NOTE — Telephone Encounter (Signed)
Called, spoke with pt.  We have scheduled him for a f/u on Sep 07, 2012 at 1:45 pm.  Pt aware.

## 2012-09-07 ENCOUNTER — Encounter: Payer: Self-pay | Admitting: Internal Medicine

## 2012-09-07 ENCOUNTER — Ambulatory Visit (INDEPENDENT_AMBULATORY_CARE_PROVIDER_SITE_OTHER): Payer: Medicare Other | Admitting: Internal Medicine

## 2012-09-07 VITALS — BP 124/76 | HR 77 | Ht 73.0 in | Wt 276.0 lb

## 2012-09-07 DIAGNOSIS — G4733 Obstructive sleep apnea (adult) (pediatric): Secondary | ICD-10-CM

## 2012-09-07 DIAGNOSIS — E662 Morbid (severe) obesity with alveolar hypoventilation: Secondary | ICD-10-CM

## 2012-09-07 DIAGNOSIS — N529 Male erectile dysfunction, unspecified: Secondary | ICD-10-CM | POA: Diagnosis not present

## 2012-09-07 NOTE — Progress Notes (Signed)
  Subjective:    Patient ID: Joel [name], male    DOB: [birth year], 79 y.o.   MRN: [id number]  HPI 07/13/10- OSA Last here 06/24/09 His BiPAP machine had done well till it failed. Advanced BIPAP 18/14. We had gotten an update sleep study in 2010 to qualfy for new machine then. Now needs to document face- to - face. He is tolerating new machine well. Feels well rested. Not snoring through now.  Very dry mouth. Uses nasal saline with eucalyptus drops for dry nose.   09/07/12- 79 yoM former smoker followed for OSA, chronic bronchitis, allergic rhinitis, complicated by DM, AFib Advanced BIPAP 18/14.    Friend here NPSG 01/21/09- Severe OSA- AHI 102.8/ hr  Weight 280 lbs.. Sleeping 10-12 hours a day and waking unrefreshed. Felt better wearing oxygen, last time he was in hospital. He asks that while home oxygen. Easier dyspnea on exertion carrying groceries.  ROS-see HPI Constitutional:   No-   weight loss, night sweats, fevers, chills, fatigue, lassitude. HEENT:   No-  headaches, difficulty swallowing, tooth/dental problems, sore throat,       No-  sneezing, itching, ear ache, nasal congestion, post nasal drip,  CV:  No-   chest pain, orthopnea, PND, swelling in lower extremities, anasarca,  dizziness, palpitations Resp: +  shortness of breath with exertion or at rest.              No-   productive cough,  No non-productive cough,  No- coughing up of blood.              No-   change in color of mucus.  No- wheezing.   Skin: No-   rash or lesions. GI:  No-   heartburn, indigestion, abdominal pain, nausea, vomiting,  GU: . MS:  No-   joint pain or swelling.  . Neuro-     nothing unusual Psych:  No- change in mood or affect. No depression or anxiety.  No memory loss.    Objective:  OBJ- Physical Exam General- Alert, Oriented, Affect-appropriate, Distress- none acute Skin- rash-none, lesions- none, excoriation- none Lymphadenopathy- none Head- atraumatic            Eyes- Gross vision intact,  PERRLA, conjunctivae and secretions clear            Ears- Hearing, canals-normal            Nose- Clear, no-Septal dev, mucus, polyps, erosion, perforation             Throat- Mallampati II , mucosa clear , drainage- none, tonsils- atrophic Neck- flexible , trachea midline, no stridor , thyroid nl, carotid no bruit Chest - symmetrical excursion , unlabored           Heart/CV- RRR , 2/6 AS murmur , no gallop  , no rub, nl s1 s2                           - JVD- none , edema+1 L>R, stasis changes- none, varices- none           Lung- clear to P&A, wheeze- none, cough- none , dullness-none, rub- none           Chest wall-  Abd- + market abdominal obesity Br/ Gen/ Rectal- Not done, not indicated Extrem- cyanosis- none, clubbing, none, atrophy- none, strength- nl Neuro- grossly intact to observation  Assessment & Plan:

## 2012-09-07 NOTE — Patient Instructions (Addendum)
Order- Advanced ONOX while wearing BIPAP on room air      Dx OSA, obesity-hypoventilation   Be sure to keep your appointment with Dr Katrinka Blazing to check your heart

## 2012-09-16 DIAGNOSIS — G4733 Obstructive sleep apnea (adult) (pediatric): Secondary | ICD-10-CM | POA: Diagnosis not present

## 2012-09-19 DIAGNOSIS — E662 Morbid (severe) obesity with alveolar hypoventilation: Secondary | ICD-10-CM | POA: Insufficient documentation

## 2012-09-19 NOTE — Assessment & Plan Note (Signed)
Not told that he is snoring, but waking unrefreshed.  Plan- overnight oximetry on BiPAP with room air

## 2012-09-19 NOTE — Assessment & Plan Note (Signed)
Obesity hypoventilation would explain hypoxia during sleep

## 2012-09-21 DIAGNOSIS — E291 Testicular hypofunction: Secondary | ICD-10-CM | POA: Diagnosis not present

## 2012-09-26 ENCOUNTER — Other Ambulatory Visit: Payer: Self-pay | Admitting: Internal Medicine

## 2012-09-26 DIAGNOSIS — E662 Morbid (severe) obesity with alveolar hypoventilation: Secondary | ICD-10-CM

## 2012-09-28 DIAGNOSIS — E291 Testicular hypofunction: Secondary | ICD-10-CM | POA: Diagnosis not present

## 2012-10-11 DIAGNOSIS — I4891 Unspecified atrial fibrillation: Secondary | ICD-10-CM | POA: Diagnosis not present

## 2012-10-11 DIAGNOSIS — R0609 Other forms of dyspnea: Secondary | ICD-10-CM | POA: Diagnosis not present

## 2012-10-11 DIAGNOSIS — Z7901 Long term (current) use of anticoagulants: Secondary | ICD-10-CM | POA: Diagnosis not present

## 2012-10-11 DIAGNOSIS — I10 Essential (primary) hypertension: Secondary | ICD-10-CM | POA: Diagnosis not present

## 2012-10-11 DIAGNOSIS — F319 Bipolar disorder, unspecified: Secondary | ICD-10-CM | POA: Diagnosis not present

## 2012-10-11 DIAGNOSIS — I359 Nonrheumatic aortic valve disorder, unspecified: Secondary | ICD-10-CM | POA: Diagnosis not present

## 2012-10-27 ENCOUNTER — Encounter: Payer: Self-pay | Admitting: Internal Medicine

## 2012-11-07 ENCOUNTER — Ambulatory Visit (INDEPENDENT_AMBULATORY_CARE_PROVIDER_SITE_OTHER): Payer: Medicare Other | Admitting: Internal Medicine

## 2012-11-07 ENCOUNTER — Ambulatory Visit: Payer: Medicare Other | Admitting: Internal Medicine

## 2012-11-07 ENCOUNTER — Encounter: Payer: Self-pay | Admitting: Internal Medicine

## 2012-11-07 VITALS — BP 122/76 | HR 78 | Ht 73.0 in | Wt 271.6 lb

## 2012-11-07 DIAGNOSIS — R0609 Other forms of dyspnea: Secondary | ICD-10-CM | POA: Diagnosis not present

## 2012-11-07 DIAGNOSIS — Z23 Encounter for immunization: Secondary | ICD-10-CM | POA: Diagnosis not present

## 2012-11-07 DIAGNOSIS — E662 Morbid (severe) obesity with alveolar hypoventilation: Secondary | ICD-10-CM

## 2012-11-07 DIAGNOSIS — I4891 Unspecified atrial fibrillation: Secondary | ICD-10-CM | POA: Diagnosis not present

## 2012-11-07 DIAGNOSIS — R0600 Dyspnea, unspecified: Secondary | ICD-10-CM

## 2012-11-07 DIAGNOSIS — G4733 Obstructive sleep apnea (adult) (pediatric): Secondary | ICD-10-CM

## 2012-11-07 DIAGNOSIS — E291 Testicular hypofunction: Secondary | ICD-10-CM | POA: Diagnosis not present

## 2012-11-07 NOTE — Progress Notes (Signed)
Subjective:    Patient ID: Joel [name], male    DOB: 04-Jun-1932, 79 y.o.   MRN: [id number]  HPI 07/13/10- OSA Last here 06/24/09 His BiPAP machine had done well till it failed. Advanced BIPAP 18/14. We had gotten an update sleep study in 2010 to qualfy for new machine then. Now needs to document face- to - face. He is tolerating new machine well. Feels well rested. Not snoring through now.  Very dry mouth. Uses nasal saline with eucalyptus drops for dry nose.   09/07/12- 79 yoM former smoker followed for OSA, chronic bronchitis, allergic rhinitis, complicated by DM, AFib Advanced BIPAP 18/14.    Friend here NPSG 01/21/09- Severe OSA- AHI 102.8/ hr  Weight 280 lbs.. Sleeping 10-12 hours a day and waking unrefreshed. Felt better wearing oxygen, last time he was in hospital. He asks that while home oxygen. Easier dyspnea on exertion carrying groceries.  11/07/12- 79 yoM former smoker followed for OSA, chronic bronchitis, allergic rhinitis, complicated by DM, AFib Advanced BIPAP 18/14.    Friend here FOLLOWS FOR: Wears BiPAP 18/14 O2 2L/ Advanced every night for about 10-11 hours; pressure working well for patient;  Pt states he still wakes up tired(has gotten worse since 09-2012 visit) Have a cardiology workup by Dr. Katrinka Blazing, pending echo. Had chest x-ray and EKG at [facility], never told he was anemic. Dyspnea and some nausea lasted 2 or 3 minutes after walking test that office which really stressed in. Resolved with rest. No pain or palpitation. Oxygen saturation was 95% at heart rate 99 at the end of the walk. Saturations 95-96% range at home using his wife's oximeter.  ROS-see HPI Constitutional:   No-   weight loss, night sweats, fevers, chills, fatigue, lassitude. HEENT:   No-  headaches, difficulty swallowing, tooth/dental problems, sore throat,       No-  sneezing, itching, ear ache, nasal congestion, post nasal drip,  CV:  No-   chest pain, orthopnea, PND, swelling in lower extremities,  anasarca,  dizziness, palpitations Resp: +  shortness of breath with exertion or at rest.              No-   productive cough,  No non-productive cough,  No- coughing up of blood.              No-   change in color of mucus.  No- wheezing.   Skin: No-   rash or lesions. GI:  No-   heartburn, indigestion, abdominal pain, nausea, vomiting,  GU: . MS:  No-   joint pain or swelling.  . Neuro-     nothing unusual Psych:  No- change in mood or affect. No depression or anxiety.  No memory loss.    Objective:  OBJ- Physical Exam General- Alert, Oriented, Affect-appropriate, Distress- none acute Skin- rash-none, lesions- none, excoriation- none Lymphadenopathy- none Head- atraumatic            Eyes- Gross vision intact, PERRLA, conjunctivae and secretions clear            Ears- Hearing, canals-normal            Nose- Clear, no-Septal dev, mucus, polyps, erosion, perforation             Throat- Mallampati II , mucosa clear , drainage- none, tonsils- atrophic, +dentures Neck- flexible , trachea midline, no stridor , thyroid nl, carotid no bruit Chest - symmetrical excursion , unlabored           Heart/CV- RRR ,  2/6 AS murmur , no gallop  , no rub, nl s1 s2                           - JVD- none , edema+trace L>R, stasis changes- none, varices- none           Lung- clear to P&A, wheeze- none, cough- none , dullness-none, rub- none           Chest wall-  Abd- + market abdominal obesity Br/ Gen/ Rectal- Not done, not indicated Extrem- cyanosis- none, clubbing, none, atrophy- none, strength- nl Neuro- grossly intact to observation  Assessment & Plan:

## 2012-11-07 NOTE — Patient Instructions (Addendum)
Flu vax  Schedule PFT      Dx Dyspnea  Ask Dr Donette Larry if you are anemic- low red blood count could add to shortness of breath

## 2012-11-17 ENCOUNTER — Other Ambulatory Visit (HOSPITAL_COMMUNITY): Payer: Self-pay | Admitting: Interventional Cardiology

## 2012-11-17 DIAGNOSIS — I4891 Unspecified atrial fibrillation: Secondary | ICD-10-CM

## 2012-11-18 NOTE — Assessment & Plan Note (Signed)
He states he is unable to lose weight. Significant obesity with hypoventilation and deconditioning are substantial parts of his dyspnea with exertion

## 2012-11-18 NOTE — Assessment & Plan Note (Signed)
Good compliance and control 

## 2012-11-18 NOTE — Assessment & Plan Note (Signed)
Rhythm very regular on exam today

## 2012-11-21 ENCOUNTER — Ambulatory Visit (INDEPENDENT_AMBULATORY_CARE_PROVIDER_SITE_OTHER): Payer: Medicare Other | Admitting: Pharmacist

## 2012-11-21 ENCOUNTER — Ambulatory Visit (HOSPITAL_COMMUNITY): Payer: Medicare Other | Attending: Cardiology

## 2012-11-21 DIAGNOSIS — E669 Obesity, unspecified: Secondary | ICD-10-CM | POA: Diagnosis not present

## 2012-11-21 DIAGNOSIS — I079 Rheumatic tricuspid valve disease, unspecified: Secondary | ICD-10-CM | POA: Insufficient documentation

## 2012-11-21 DIAGNOSIS — Z8673 Personal history of transient ischemic attack (TIA), and cerebral infarction without residual deficits: Secondary | ICD-10-CM | POA: Diagnosis not present

## 2012-11-21 DIAGNOSIS — I080 Rheumatic disorders of both mitral and aortic valves: Secondary | ICD-10-CM | POA: Diagnosis not present

## 2012-11-21 DIAGNOSIS — R0609 Other forms of dyspnea: Secondary | ICD-10-CM | POA: Insufficient documentation

## 2012-11-21 DIAGNOSIS — I4891 Unspecified atrial fibrillation: Secondary | ICD-10-CM

## 2012-11-21 DIAGNOSIS — I10 Essential (primary) hypertension: Secondary | ICD-10-CM | POA: Insufficient documentation

## 2012-11-21 DIAGNOSIS — E119 Type 2 diabetes mellitus without complications: Secondary | ICD-10-CM | POA: Insufficient documentation

## 2012-11-21 DIAGNOSIS — R0989 Other specified symptoms and signs involving the circulatory and respiratory systems: Secondary | ICD-10-CM | POA: Insufficient documentation

## 2012-11-21 DIAGNOSIS — E785 Hyperlipidemia, unspecified: Secondary | ICD-10-CM | POA: Insufficient documentation

## 2012-11-21 LAB — POCT INR: INR: 3

## 2012-11-21 NOTE — Progress Notes (Signed)
Echocardiogram performed.  

## 2012-11-24 ENCOUNTER — Telehealth: Payer: Self-pay

## 2012-11-24 NOTE — Telephone Encounter (Signed)
Message copied by Jarvis Newcomer on Fri Nov 24, 2012  3:12 PM ------      Message from: Verdis Prime      Created: Wed Nov 22, 2012  8:13 AM       Will discuss at the next OV. When is it? If none, please schedule. ------

## 2012-11-28 DIAGNOSIS — G4733 Obstructive sleep apnea (adult) (pediatric): Secondary | ICD-10-CM | POA: Diagnosis not present

## 2012-11-28 DIAGNOSIS — G609 Hereditary and idiopathic neuropathy, unspecified: Secondary | ICD-10-CM | POA: Diagnosis not present

## 2012-11-28 DIAGNOSIS — I10 Essential (primary) hypertension: Secondary | ICD-10-CM | POA: Diagnosis not present

## 2012-11-28 DIAGNOSIS — I359 Nonrheumatic aortic valve disorder, unspecified: Secondary | ICD-10-CM | POA: Diagnosis not present

## 2012-11-28 DIAGNOSIS — E291 Testicular hypofunction: Secondary | ICD-10-CM | POA: Diagnosis not present

## 2012-11-28 DIAGNOSIS — F329 Major depressive disorder, single episode, unspecified: Secondary | ICD-10-CM | POA: Diagnosis not present

## 2012-11-28 DIAGNOSIS — E1149 Type 2 diabetes mellitus with other diabetic neurological complication: Secondary | ICD-10-CM | POA: Diagnosis not present

## 2012-11-28 DIAGNOSIS — E782 Mixed hyperlipidemia: Secondary | ICD-10-CM | POA: Diagnosis not present

## 2012-11-29 ENCOUNTER — Encounter: Payer: Self-pay | Admitting: Interventional Cardiology

## 2012-11-29 ENCOUNTER — Ambulatory Visit (INDEPENDENT_AMBULATORY_CARE_PROVIDER_SITE_OTHER): Payer: Medicare Other | Admitting: Internal Medicine

## 2012-11-29 DIAGNOSIS — R0609 Other forms of dyspnea: Secondary | ICD-10-CM

## 2012-11-29 DIAGNOSIS — R0600 Dyspnea, unspecified: Secondary | ICD-10-CM

## 2012-11-29 NOTE — Progress Notes (Signed)
PFT done today. 

## 2012-11-30 ENCOUNTER — Encounter: Payer: Self-pay | Admitting: Interventional Cardiology

## 2012-12-01 ENCOUNTER — Telehealth: Payer: Self-pay | Admitting: Interventional Cardiology

## 2012-12-01 ENCOUNTER — Ambulatory Visit: Payer: Medicare Other | Admitting: Interventional Cardiology

## 2012-12-01 DIAGNOSIS — I4891 Unspecified atrial fibrillation: Secondary | ICD-10-CM

## 2012-12-01 NOTE — Telephone Encounter (Signed)
The aortic valve is moderately stenotic(obstructed). Requires no current action. We should repeat in 1 year. Will see hime at the next OV

## 2012-12-01 NOTE — Telephone Encounter (Signed)
pt aware of echo results.The aortic valve is moderately stenotic(obstructed). Requires no current action. We should repeat in 1 year. Will see hime at the next OV 04/2013.pt verbalized understanding

## 2012-12-01 NOTE — Telephone Encounter (Signed)
New Problem:  Pt states he overslept and will not be able to make his appt that was scheduled for this morning at 11:30.... Pt states he would like the nurse to give him a call back with his recent test results. Please advise

## 2012-12-20 DIAGNOSIS — E291 Testicular hypofunction: Secondary | ICD-10-CM | POA: Diagnosis not present

## 2013-01-12 DIAGNOSIS — E291 Testicular hypofunction: Secondary | ICD-10-CM | POA: Diagnosis not present

## 2013-01-16 ENCOUNTER — Other Ambulatory Visit: Payer: Self-pay | Admitting: Gastroenterology

## 2013-01-16 DIAGNOSIS — E291 Testicular hypofunction: Secondary | ICD-10-CM | POA: Diagnosis not present

## 2013-01-18 ENCOUNTER — Encounter (HOSPITAL_COMMUNITY): Payer: Self-pay | Admitting: Pharmacy Technician

## 2013-01-22 ENCOUNTER — Encounter (HOSPITAL_COMMUNITY): Payer: Self-pay | Admitting: *Deleted

## 2013-01-23 ENCOUNTER — Other Ambulatory Visit: Payer: Self-pay

## 2013-01-23 ENCOUNTER — Encounter (HOSPITAL_COMMUNITY): Payer: Self-pay | Admitting: *Deleted

## 2013-01-23 ENCOUNTER — Ambulatory Visit (HOSPITAL_COMMUNITY)
Admission: RE | Admit: 2013-01-23 | Discharge: 2013-01-23 | Disposition: A | Discharge status: Home / Self Care | Payer: Medicare Other | Source: Ambulatory Visit | Attending: Gastroenterology | Admitting: Gastroenterology

## 2013-01-23 ENCOUNTER — Encounter (HOSPITAL_COMMUNITY): Payer: Medicare Other | Admitting: Anesthesiology

## 2013-01-23 ENCOUNTER — Encounter (HOSPITAL_COMMUNITY)
Admission: RE | Disposition: A | Discharge status: Home / Self Care | Payer: Self-pay | Source: Ambulatory Visit | Attending: Gastroenterology

## 2013-01-23 ENCOUNTER — Ambulatory Visit (HOSPITAL_COMMUNITY): Payer: Medicare Other | Admitting: Anesthesiology

## 2013-01-23 DIAGNOSIS — Z860100 Personal history of colon polyps, unspecified: Secondary | ICD-10-CM | POA: Insufficient documentation

## 2013-01-23 DIAGNOSIS — K5730 Diverticulosis of large intestine without perforation or abscess without bleeding: Secondary | ICD-10-CM | POA: Diagnosis not present

## 2013-01-23 DIAGNOSIS — E1142 Type 2 diabetes mellitus with diabetic polyneuropathy: Secondary | ICD-10-CM | POA: Diagnosis not present

## 2013-01-23 DIAGNOSIS — I4891 Unspecified atrial fibrillation: Secondary | ICD-10-CM | POA: Insufficient documentation

## 2013-01-23 DIAGNOSIS — E1149 Type 2 diabetes mellitus with other diabetic neurological complication: Secondary | ICD-10-CM | POA: Insufficient documentation

## 2013-01-23 DIAGNOSIS — D649 Anemia, unspecified: Secondary | ICD-10-CM | POA: Diagnosis not present

## 2013-01-23 DIAGNOSIS — G4733 Obstructive sleep apnea (adult) (pediatric): Secondary | ICD-10-CM | POA: Insufficient documentation

## 2013-01-23 DIAGNOSIS — I10 Essential (primary) hypertension: Secondary | ICD-10-CM | POA: Diagnosis not present

## 2013-01-23 DIAGNOSIS — E7800 Pure hypercholesterolemia, unspecified: Secondary | ICD-10-CM | POA: Insufficient documentation

## 2013-01-23 DIAGNOSIS — Z7901 Long term (current) use of anticoagulants: Secondary | ICD-10-CM | POA: Insufficient documentation

## 2013-01-23 DIAGNOSIS — D509 Iron deficiency anemia, unspecified: Secondary | ICD-10-CM | POA: Insufficient documentation

## 2013-01-23 DIAGNOSIS — E669 Obesity, unspecified: Secondary | ICD-10-CM | POA: Diagnosis not present

## 2013-01-23 DIAGNOSIS — D126 Benign neoplasm of colon, unspecified: Secondary | ICD-10-CM | POA: Diagnosis not present

## 2013-01-23 HISTORY — PX: ESOPHAGOGASTRODUODENOSCOPY (EGD) WITH PROPOFOL: SHX5813

## 2013-01-23 HISTORY — DX: Anemia, unspecified: D64.9

## 2013-01-23 HISTORY — DX: Gout, unspecified: M10.9

## 2013-01-23 HISTORY — PX: COLONOSCOPY WITH PROPOFOL: SHX5780

## 2013-01-23 HISTORY — DX: Dependence on supplemental oxygen: Z99.81

## 2013-01-23 HISTORY — DX: Other fatigue: R53.83

## 2013-01-23 LAB — POCT I-STAT 4, (NA,K, GLUC, HGB,HCT)
Glucose, Bld: 93 mg/dL (ref 70–99)
HCT: 32 % — ABNORMAL LOW (ref 39.0–52.0)

## 2013-01-23 SURGERY — COLONOSCOPY WITH PROPOFOL
Anesthesia: Monitor Anesthesia Care

## 2013-01-23 MED ORDER — ONDANSETRON HCL 4 MG/2ML IJ SOLN
INTRAMUSCULAR | Status: AC
  Filled 2013-01-23: qty 2

## 2013-01-23 MED ORDER — ONDANSETRON HCL 4 MG/2ML IJ SOLN
INTRAMUSCULAR | Status: DC | PRN
  Administered 2013-01-23: 4 mg via INTRAVENOUS

## 2013-01-23 MED ORDER — PROMETHAZINE HCL 25 MG/ML IJ SOLN: 6.2500 mg | INTRAMUSCULAR | Status: DC | PRN

## 2013-01-23 MED ORDER — LIDOCAINE HCL (CARDIAC) 20 MG/ML IV SOLN
INTRAVENOUS | Status: AC
  Filled 2013-01-23: qty 5

## 2013-01-23 MED ORDER — PROPOFOL INFUSION 10 MG/ML OPTIME
INTRAVENOUS | Status: DC | PRN
  Administered 2013-01-23: 160 ug/kg/min via INTRAVENOUS

## 2013-01-23 MED ORDER — LIDOCAINE HCL (CARDIAC) 20 MG/ML IV SOLN
INTRAVENOUS | Status: DC | PRN
  Administered 2013-01-23: 100 mg via INTRAVENOUS

## 2013-01-23 MED ORDER — SODIUM CHLORIDE 0.9 % IV SOLN: INTRAVENOUS | Status: DC

## 2013-01-23 MED ORDER — LACTATED RINGERS IV SOLN
INTRAVENOUS | Status: DC
  Administered 2013-01-23: 1000 mL via INTRAVENOUS

## 2013-01-23 MED ORDER — PROPOFOL 10 MG/ML IV BOLUS
INTRAVENOUS | Status: AC
  Filled 2013-01-23: qty 20

## 2013-01-23 MED ORDER — KETAMINE HCL 10 MG/ML IJ SOLN
INTRAMUSCULAR | Status: DC | PRN
  Administered 2013-01-23 (×2): 10 mg via INTRAVENOUS

## 2013-01-23 MED ORDER — BUTAMBEN-TETRACAINE-BENZOCAINE 2-2-14 % EX AERO
INHALATION_SPRAY | CUTANEOUS | Status: DC | PRN
  Administered 2013-01-23: 2 via TOPICAL

## 2013-01-23 SURGICAL SUPPLY — 25 items

## 2013-01-23 NOTE — H&P (Signed)
  Problem: Unexplained microcytic anemia (hemoglobin 9.7 g)  History: The patient is a 79 year old male born 11/29/1932. The patient has chronic atrial fibrillation and takes Coumadin on a daily basis.  The patient has developed unexplained microcytic anemia associated with frequent nosebleeds but unassociated with gastrointestinal bleeding, hematuria, or hemoptysis. On 09/27/2005, the patient underwent a colonoscopy with removal of a diminutive adenomatous colon polyp.  The patient is scheduled to undergo a diagnostic esophagogastroduodenoscopy and colonoscopy today. He stopped taking Coumadin 5 days prior to today's exam.  Past medical history: Chronic atrial fibrillation. Chronic Coumadin anticoagulation. Hypertension. Type 2 diabetes mellitus. Hypercholesterolemia. Obstructive sleep apnea syndrome. Chronic anxiety. Chronic insomnia. Allergic rhinitis. Osteoarthritis. Obesity. Gout. Diabetic peripheral neuropathy. Personal history of adenomatous colon polyp removed colonoscopically. Bipolar disorder. Mild-moderate aortic stenosis. MAZE procedure. Right shoulder surgery. Bilateral cataract surgery. Knee surgery.  Allergies: Novocaine cause depression. Antihistamines caused depression. Beta blockers caused depression  Exam: The patient is alert and lying comfortably on the endoscopy stretcher. Cardiac exam reveals a regular rhythm. Lungs are clear to auscultation. Abdomen is quite protuberant but nontender to palpation.  Plan: Proceed with diagnostic esophagogastroduodenoscopy and colonoscopy to evaluate unexplained microcytic anemia.

## 2013-01-23 NOTE — Anesthesia Postprocedure Evaluation (Signed)
Anesthesia Post Note  Patient: Joel [name]  Procedure(s) Performed: Procedure(s) (LRB): COLONOSCOPY WITH PROPOFOL (N/A) ESOPHAGOGASTRODUODENOSCOPY (EGD) WITH PROPOFOL (N/A)  Anesthesia type: General  Patient location: PACU  Post pain: Pain level controlled  Post assessment: Post-op Vital signs reviewed  Last Vitals:  Filed Vitals:   01/23/13 1238  BP: 149/99  Pulse: 64  Temp: 36.8 C  Resp: 21    Post vital signs: Reviewed  Level of consciousness: sedated  Complications: No apparent anesthesia complications

## 2013-01-23 NOTE — Op Note (Signed)
Problems: Unexplained microcytic anemia on Coumadin. Frequent nosebleeds.  Endoscopist: Danise Edge  Premedication: Propofol administered by anesthesia  Procedure: Diagnostic esophagogastroduodenoscopy The patient was placed in the left lateral decubitus position. The Pentax gastroscope was passed through the posterior hypopharynx into the proximal esophagus without difficulty. The hypopharynx, larynx, and vocal cords appeared normal.  Esophagoscopy: The proximal, mid, and lower segments of the esophageal mucosa appear normal. The squamocolumnar junction is slightly irregular and noted at 45 cm from the incisor teeth.  Gastroscopy: Retroflex view of the gastric cardia and fundus was normal. The gastric body, antrum, and pylorus appeared normal.  Duodenoscopy: The duodenal bulb and descending duodenum appeared normal.  Assessment: Normal esophagogastroduodenoscopy  Procedure: Diagnostic colonoscopy Anal inspection and digital rectal exam were normal. The Pentax pediatric colonoscope was introduced into the rectum and easily advanced to the cecum. A normal-appearing appendiceal orifice and ileocecal valve were identified. Colonic preparation for the exam today was good.  Rectum. Normal. Retroflexed view of the distal rectum normal.  Sigmoid colon and descending colon. Extensive left colonic diverticulosis  Splenic flexure. Normal  Transverse colon. Normal  Hepatic flexure. Normal  Ascending colon. Normal  Cecum and ileocecal valve. From the distal cecum a 6 mm sessile polyp was removed with the hot snare. The post-polypectomy mucosal defect was closed with an Endo Clip  Assessment:  #1. Extensive left colonic diverticulosis  #2. From the distal cecum a 6 mm sessile polyp was removed with the hot snare; the post-polypectomy mucosal defect was closed with an Endo Clip  #3. No signs of chronic gastrointestinal bleeding

## 2013-01-23 NOTE — Anesthesia Preprocedure Evaluation (Signed)
Anesthesia Evaluation  Patient identified by MRN, date of birth, ID band Patient awake    Reviewed: Allergy & Precautions, H&P , NPO status , Patient's Chart, lab work & pertinent test results  Airway       Dental   Pulmonary shortness of breath and Long-Term Oxygen Therapy, sleep apnea and Continuous Positive Airway Pressure Ventilation , COPDformer smoker,          Cardiovascular + dysrhythmias Atrial Fibrillation     Neuro/Psych Bipolar Disorder negative neurological ROS     GI/Hepatic negative GI ROS, Neg liver ROS,   Endo/Other  diabetes, Type 2, Oral Hypoglycemic AgentsMorbid obesity  Renal/GU negative Renal ROS  negative genitourinary   Musculoskeletal negative musculoskeletal ROS (+)   Abdominal   Peds  Hematology  (+) anemia ,   Anesthesia Other Findings   Reproductive/Obstetrics                           Anesthesia Physical Anesthesia Plan  ASA: III  Anesthesia Plan: MAC   Post-op Pain Management:    Induction: Intravenous  Airway Management Planned: Nasal Cannula  Additional Equipment:   Intra-op Plan:   Post-operative Plan:   Informed Consent: I have reviewed the patients History and Physical, chart, labs and discussed the procedure including the risks, benefits and alternatives for the proposed anesthesia with the patient or authorized representative who has indicated his/her understanding and acceptance.   Dental advisory given  Plan Discussed with: CRNA  Anesthesia Plan Comments:         Anesthesia Quick Evaluation

## 2013-01-23 NOTE — Transfer of Care (Signed)
Immediate Anesthesia Transfer of Care Note  Patient: Joel [name]  Procedure(s) Performed: Procedure(s) (LRB): COLONOSCOPY WITH PROPOFOL (N/A) ESOPHAGOGASTRODUODENOSCOPY (EGD) WITH PROPOFOL (N/A)  Patient Location: PACU  Anesthesia Type: MAC  Level of Consciousness: sedated, patient cooperative and responds to stimulation  Airway & Oxygen Therapy: Patient Spontanous Breathing and Patient connected to face mask oxgen  Post-op Assessment: Report given to PACU RN and Post -op Vital signs reviewed and stable  Post vital signs: Reviewed and stable  Complications: No apparent anesthesia complications

## 2013-01-24 ENCOUNTER — Encounter (HOSPITAL_COMMUNITY): Payer: Self-pay | Admitting: Gastroenterology

## 2013-03-06 ENCOUNTER — Ambulatory Visit (INDEPENDENT_AMBULATORY_CARE_PROVIDER_SITE_OTHER): Payer: Medicare Other | Admitting: Pharmacist

## 2013-03-06 DIAGNOSIS — I4891 Unspecified atrial fibrillation: Secondary | ICD-10-CM | POA: Diagnosis not present

## 2013-03-06 LAB — POCT INR: INR: 2.1

## 2013-03-13 ENCOUNTER — Ambulatory Visit (INDEPENDENT_AMBULATORY_CARE_PROVIDER_SITE_OTHER): Payer: Medicare Other | Admitting: Internal Medicine

## 2013-03-13 ENCOUNTER — Encounter: Payer: Self-pay | Admitting: Internal Medicine

## 2013-03-13 ENCOUNTER — Encounter (INDEPENDENT_AMBULATORY_CARE_PROVIDER_SITE_OTHER): Payer: Self-pay

## 2013-03-13 VITALS — BP 130/72 | HR 66 | Ht 73.0 in | Wt 265.8 lb

## 2013-03-13 DIAGNOSIS — G4733 Obstructive sleep apnea (adult) (pediatric): Secondary | ICD-10-CM

## 2013-03-13 DIAGNOSIS — D509 Iron deficiency anemia, unspecified: Secondary | ICD-10-CM | POA: Diagnosis not present

## 2013-03-13 DIAGNOSIS — R0609 Other forms of dyspnea: Secondary | ICD-10-CM | POA: Diagnosis not present

## 2013-03-13 DIAGNOSIS — R0989 Other specified symptoms and signs involving the circulatory and respiratory systems: Secondary | ICD-10-CM

## 2013-03-13 DIAGNOSIS — E662 Morbid (severe) obesity with alveolar hypoventilation: Secondary | ICD-10-CM | POA: Diagnosis not present

## 2013-03-13 NOTE — Patient Instructions (Signed)
Weight, anemia, mild obstructive airways disease and the tight aortic heart valve all contribute to your shortness of breath with exertion.  We can continue BIPAP with oxygen as before  Sample Anoro inhaler 1 puff, once daily

## 2013-03-13 NOTE — Progress Notes (Signed)
Subjective:    Patient ID: Joel [name], male    DOB: Sep 28, 1932, 80 y.o.   MRN: [id number]  HPI 07/13/10- OSA Last here 06/24/09 His BiPAP machine had done well till it failed. Advanced BIPAP 18/14. We had gotten an update sleep study in 2010 to qualfy for new machine then. Now needs to document face- to - face. He is tolerating new machine well. Feels well rested. Not snoring through now.  Very dry mouth. Uses nasal saline with eucalyptus drops for dry nose.   09/07/12- 79 yoM former smoker followed for OSA, chronic bronchitis, allergic rhinitis, complicated by DM, AFib Advanced BIPAP 18/14.    Friend here NPSG 01/21/09- Severe OSA- AHI 102.8/ hr  Weight 280 lbs.. Sleeping 10-12 hours a day and waking unrefreshed. Felt better wearing oxygen, last time he was in hospital. He asks that while home oxygen. Easier dyspnea on exertion carrying groceries.  11/07/12- 79 yoM former smoker followed for OSA, chronic bronchitis, allergic rhinitis, complicated by DM, AFib Advanced BIPAP 18/14.    Friend here FOLLOWS FOR: Wears BiPAP 18/14 O2 2L/ Advanced every night for about 10-11 hours; pressure working well for patient;  Pt states he still wakes up tired(has gotten worse since 09-2012 visit) Have a cardiology workup by Dr. Tamala Julian, pending echo. Had chest x-[facility] and E[facility] at [facility], never told he was anemic. Dyspnea and some nausea lasted 2 or 3 minutes after walking test that office which really stressed him. Resolved with rest. No pain or palpitation. Oxygen saturation was 95% at heart rate 99 at the end of the walk. Saturations 95-96% range at home using his wife's oximeter.  03/13/13- 80 yoM former smoker followed for OSA, chronic bronchitis, allergic rhinitis, complicated by DM, AFib, anemia, heart murmur FOLLOWS FOR: Wears BiPAP 18/14 w O2 2L/ Advanced every night for about 8-9 hours; pressure works well for patient. Says these really help.   Dr. Tamala Julian has seen for heart murmur. Dr Deforest Hoyles has seen for  anemia treated with iron. Endoscopy negative. Echo 11/21/12- mild PHTN, Mild -mod AS, nl EF PFT 11/29/2012-mild obstructive airways disease with response to bronchodilator, FVC 3.45/77%, FEV1 2.43/75%, FEV1/FVC 0.70, FEF 25-75% 1.98/87%. TLC 81%, DLCO 61%.  ROS-see HPI Constitutional:   No-   weight loss, night sweats, fevers, chills, fatigue, lassitude. HEENT:   No-  headaches, difficulty swallowing, tooth/dental problems, sore throat,       No-  sneezing, itching, ear ache, nasal congestion, post nasal drip,  CV:  No-   chest pain, orthopnea, PND, swelling in lower extremities, anasarca,  dizziness, palpitations Resp: +  shortness of breath with exertion or at rest.              No-   productive cough,  No non-productive cough,  No- coughing up of blood.              No-   change in color of mucus.  No- wheezing.   Skin: No-   rash or lesions. GI:  No-   heartburn, indigestion, abdominal pain, nausea, vomiting,  GU: . MS:  No-   joint pain or swelling.  . Neuro-     nothing unusual Psych:  No- change in mood or affect. No depression or anxiety.  No memory loss.    Objective:  OBJ- Physical Exam General- Alert, Oriented, Affect-appropriate, Distress- none acute Skin- rash-none, lesions- none, excoriation- none Lymphadenopathy- none Head- atraumatic            Eyes- Gross  vision intact, PERRLA, conjunctivae and secretions clear            Ears- Hearing, canals-normal            Nose- Clear, no-Septal dev, mucus, polyps, erosion, perforation             Throat- Mallampati II , mucosa clear , drainage- none, tonsils- atrophic, +dentures Neck- flexible , trachea midline, no stridor , thyroid nl, carotid no bruit Chest - symmetrical excursion , unlabored           Heart/CV- RRR , 2/6 AS murmur , no gallop  , no rub, nl s1 s2                           - JVD- none , edema+trace L>R, stasis changes- none, varices- none           Lung- clear to P&A, wheeze- none, cough- none , dullness-none,  rub- none           Chest wall-  Abd- + market abdominal obesity Br/ Gen/ Rectal- Not done, not indicated Extrem- cyanosis- none, clubbing, none, atrophy- none, strength- nl Neuro- grossly intact to observation  Assessment & Plan:

## 2013-03-14 ENCOUNTER — Encounter: Payer: Self-pay | Admitting: Internal Medicine

## 2013-03-15 DIAGNOSIS — D649 Anemia, unspecified: Secondary | ICD-10-CM | POA: Diagnosis not present

## 2013-03-15 DIAGNOSIS — N529 Male erectile dysfunction, unspecified: Secondary | ICD-10-CM | POA: Diagnosis not present

## 2013-04-04 DIAGNOSIS — D509 Iron deficiency anemia, unspecified: Secondary | ICD-10-CM | POA: Insufficient documentation

## 2013-04-04 NOTE — Assessment & Plan Note (Signed)
BiPAP plus oxygen significantly helps. Surprisingly normal lung volumes when he makes the effort, despite his abdominal obesity.

## 2013-04-04 NOTE — Assessment & Plan Note (Signed)
Good compliance and control combining BiPAP with oxygen

## 2013-04-04 NOTE — Assessment & Plan Note (Signed)
Dyspnea reflects mild obstructive airways disease, mild valvular heart disease, significant obesity with deconditioning, mild anemia. There is a small reactive component Plan-try sample of Anoro

## 2013-04-17 ENCOUNTER — Encounter: Payer: Self-pay | Admitting: Interventional Cardiology

## 2013-04-17 ENCOUNTER — Ambulatory Visit (INDEPENDENT_AMBULATORY_CARE_PROVIDER_SITE_OTHER): Payer: Medicare Other | Admitting: *Deleted

## 2013-04-17 ENCOUNTER — Ambulatory Visit (INDEPENDENT_AMBULATORY_CARE_PROVIDER_SITE_OTHER): Payer: Medicare Other | Admitting: Interventional Cardiology

## 2013-04-17 VITALS — BP 152/71 | HR 61 | Ht 72.0 in | Wt 269.0 lb

## 2013-04-17 DIAGNOSIS — I359 Nonrheumatic aortic valve disorder, unspecified: Secondary | ICD-10-CM | POA: Diagnosis not present

## 2013-04-17 DIAGNOSIS — Z7901 Long term (current) use of anticoagulants: Secondary | ICD-10-CM

## 2013-04-17 DIAGNOSIS — Z5181 Encounter for therapeutic drug level monitoring: Secondary | ICD-10-CM | POA: Diagnosis not present

## 2013-04-17 DIAGNOSIS — I350 Nonrheumatic aortic (valve) stenosis: Secondary | ICD-10-CM | POA: Insufficient documentation

## 2013-04-17 DIAGNOSIS — I4891 Unspecified atrial fibrillation: Secondary | ICD-10-CM | POA: Diagnosis not present

## 2013-04-17 LAB — POCT INR: INR: 3.2

## 2013-04-17 NOTE — Patient Instructions (Signed)
Your physician recommends that you continue on your current medications as directed. Please refer to the Current Medication list given to you today.  Your physician wants you to follow-up in: 1 year. You will receive a reminder letter in the mail two months in advance. If you don't receive a letter, please call our office to schedule the follow-up appointment.  

## 2013-04-17 NOTE — Progress Notes (Signed)
Patient ID: Joel [name], male   DOB: 11-08-1932, 80 y.o.   MRN: [id number]    [address], [place]  [address] Phone: [phone] Fax:  [phone]  Date:  04/17/2013   ID:  Joel [name], DOB September 23, 1932, MRN [id number]  PCP:  Wenda Low, MD   ASSESSMENT:  1. Chronic atrial fibrillation, with controlled rate 2. Chronic Coumadin therapy 3. Moderate aortic stenosis  PLAN:  1. No change in current therapy 2. Clinical followup in one year 3. Continue anticoagulation 4. Call if lower extremity edema, orthopnea, or chest pain.   SUBJECTIVE: Joel [name] is a 80 y.o. male who is now wearing chronic O2 therapy. He has been evaluated and found to have a significant lung problem. His been minimal lower extremity edema. He denies chest pain. He has not had syncope. No blood is been noted in his urine although he was severely anemic a while back and Coumadin had to be held for a while. He is now back on Coumadin therapy to   Wt Readings from Last 3 Encounters:  04/17/13 269 lb (122.018 kg)  03/13/13 265 lb 12.8 oz (120.566 kg)  01/23/13 263 lb (119.296 kg)     Past Medical History  Diagnosis Date  . COPD (chronic obstructive pulmonary disease)   . Atrial fibrillation   . Type 2 diabetes mellitus   . Bipolar affective disorder   . Vertigo   . Anemia   . Gout   . Fatigue     last year or so  . OSA (obstructive sleep apnea)     severe, uses bipap 16 1/2 by 12 or 13 setting  . On home oxygen therapy 2 1/2 liters with bipap at night    Current Outpatient Prescriptions  Medication Sig Dispense Refill  . allopurinol (ZYLOPRIM) 100 MG tablet Take 100 mg by mouth daily.       . citalopram (CELEXA) 40 MG tablet Take 40 mg by mouth every morning.      . diltiazem (DILACOR XR) 120 MG 24 hr capsule Take 120 mg by mouth at bedtime.      . furosemide (LASIX) 40 MG tablet Take 20 mg by mouth daily.       Marland Kitchen glyBURIDE (DIABETA) 5 MG tablet Take 10 mg by mouth  2 (two) times daily with a meal.       . iron polysaccharides (NIFEREX) 150 MG capsule Take 150 mg by mouth 2 (two) times daily.      Marland Kitchen lovastatin (MEVACOR) 20 MG tablet Take 20 mg by mouth every morning.       . metFORMIN (GLUCOPHAGE) 500 MG tablet Take 1,000 mg by mouth 2 (two) times daily with a meal.       . potassium chloride SA (K-DUR,KLOR-CON) 20 MEQ tablet Take 20 mEq by mouth daily.      Marland Kitchen testosterone cypionate (DEPOTESTOTERONE CYPIONATE) 100 MG/ML injection Inject 100 mg into the muscle every 8 (eight) weeks. For IM use only      . valsartan (DIOVAN) 160 MG tablet Take 160 mg by mouth every morning.       . warfarin (COUMADIN) 5 MG tablet Take 5 mg by mouth every evening. every day except Wednesday 7.5 mg      . zolpidem (AMBIEN) 10 MG tablet Take 5 mg by mouth at bedtime as needed for sleep.        No current facility-administered medications for this visit.  Allergies:    Allergies  Allergen Reactions  . Antihistamines, Loratadine-Type     depression  . Meclizine     depression  . Other     Beta blockers-Novacaine depression    Social History:  The patient  reports that he quit smoking about 36 years ago. His smoking use included Cigarettes. He has a 50 pack-year smoking history. He has never used smokeless tobacco. He reports that he does not drink alcohol or use illicit drugs.   ROS:  Please see the history of present illness.   Appetite is good   All other systems reviewed and negative.   OBJECTIVE: VS:  BP 152/71  Pulse 61  Ht 6' (1.829 m)  Wt 269 lb (122.018 kg)  BMI 36.48 kg/m2 Well nourished, well developed, in no acute distress, elderly and appears older than stated age 29: normal Neck: JVD flat. Carotid bruit 2+  Cardiac:  normal S1, S2; RRR; no murmur Lungs:  clear to auscultation bilaterally, no wheezing, rhonchi or rales Abd: soft, nontender, no hepatomegaly Ext: Edema trace bilateral ankle. Pulses 2+ and symmetric Skin: warm and dry Neuro:  CNs  2-12 intact, no focal abnormalities noted  EKG:  Sinus bradycardia, left anterior hemiblock, no change from prior     Past Medical History  h/o Afib onset 1991, with multiple DCCV, then s/p Maze Procedure, 1998, no symptoms;recurrent Afib, hosp in 7/12. on coumadin. echo, cardiac sytress test in hosp ok.repeat Atrial fibrillaiton 09/14/10   Hypertension   Diabetes type 2   Dyslipidemia   OSA; BIPAP at night, Dr Huel Cote   Insomnia   ED   Allergic Rhinitis   OA   Obesity   Gout   Diabetic neuropathy of the feet   Colon polyp, colon 2007   Depression, remote history bipolar disorder   BPV- MRI- 2011 vertebral insuff   remote h/o of ETOH   peripheral neuropathy- combination of diabetes, and ETOH in past   Aortic stenosis, mild to mod 2012   Elevated PSA- seen urology    Signed, Illene Labrador III, MD 04/17/2013 4:06 PM

## 2013-04-19 ENCOUNTER — Encounter: Payer: Self-pay | Admitting: Interventional Cardiology

## 2013-05-08 ENCOUNTER — Ambulatory Visit (INDEPENDENT_AMBULATORY_CARE_PROVIDER_SITE_OTHER): Payer: Medicare Other | Admitting: Pharmacist Clinician (PhC)/ Clinical Pharmacy Specialist

## 2013-05-08 DIAGNOSIS — I4891 Unspecified atrial fibrillation: Secondary | ICD-10-CM | POA: Diagnosis not present

## 2013-05-08 LAB — POCT INR: INR: 3.2

## 2013-05-15 ENCOUNTER — Telehealth: Payer: Self-pay | Admitting: Internal Medicine

## 2013-05-15 MED ORDER — PREDNISONE 20 MG PO TABS: 20.0000 mg | ORAL_TABLET | Freq: Every day | ORAL | Status: DC

## 2013-05-15 NOTE — Telephone Encounter (Signed)
Offer prednisone 20 mg, # 5, 1 daily. Ok to use antihistamine also.

## 2013-05-15 NOTE — Telephone Encounter (Signed)
Spoke with pt and he c/o: low grade fever, sob,cough with green mucus, wheezing and tightness in chest since Thursday. Pt has upcoming appointment this Thursday 05/17/13. He is requesting that send rx to pharm to help with symptoms.  CY please advise, thanks

## 2013-05-15 NOTE — Telephone Encounter (Signed)
Pt is aware of CY's recs. Rx has been sent in. Nothing further was needed. 

## 2013-05-17 ENCOUNTER — Ambulatory Visit: Payer: Medicare Other | Admitting: Internal Medicine

## 2013-05-22 ENCOUNTER — Telehealth: Payer: Self-pay | Admitting: Internal Medicine

## 2013-05-22 MED ORDER — PREDNISONE 10 MG PO TABS: ORAL_TABLET | ORAL | Status: DC

## 2013-05-22 NOTE — Telephone Encounter (Signed)
Spoke with pt. Reports he is still coughing. When he gets mucus up it is clear, wheezing, chest tx. He is still taking mucinex but no cough syrup. Please advise Dr. Annamaria Boots thanks  Allergies  Allergen Reactions  . Antihistamines, Loratadine-Type     depression  . Meclizine     depression  . Other     Beta blockers-Novacaine depression     Current Outpatient Prescriptions on File Prior to Visit  Medication Sig Dispense Refill  . allopurinol (ZYLOPRIM) 100 MG tablet Take 100 mg by mouth daily.       . citalopram (CELEXA) 40 MG tablet Take 40 mg by mouth every morning.      . diltiazem (DILACOR XR) 120 MG 24 hr capsule Take 120 mg by mouth at bedtime.      . furosemide (LASIX) 40 MG tablet Take 20 mg by mouth daily.       Marland Kitchen glyBURIDE (DIABETA) 5 MG tablet Take 10 mg by mouth 2 (two) times daily with a meal.       . iron polysaccharides (NIFEREX) 150 MG capsule Take 150 mg by mouth 2 (two) times daily.      Marland Kitchen lovastatin (MEVACOR) 20 MG tablet Take 20 mg by mouth every morning.       . metFORMIN (GLUCOPHAGE) 500 MG tablet Take 1,000 mg by mouth 2 (two) times daily with a meal.       . potassium chloride SA (K-DUR,KLOR-CON) 20 MEQ tablet Take 20 mEq by mouth daily.      . predniSONE (DELTASONE) 20 MG tablet Take 1 tablet (20 mg total) by mouth daily with breakfast.  5 tablet  0  . testosterone cypionate (DEPOTESTOTERONE CYPIONATE) 100 MG/ML injection Inject 100 mg into the muscle every 8 (eight) weeks. For IM use only      . valsartan (DIOVAN) 160 MG tablet Take 160 mg by mouth every morning.       . warfarin (COUMADIN) 5 MG tablet Take 5 mg by mouth every evening. every day except Wednesday 7.5 mg      . zolpidem (AMBIEN) 10 MG tablet Take 5 mg by mouth at bedtime as needed for sleep.        No current facility-administered medications on file prior to visit.

## 2013-05-22 NOTE — Telephone Encounter (Signed)
Offer prometh codeine cough syrup, 200 ml, 1 tsp every 8 hours as needed for cough           Prednisone 10 mg. # 20, 4 X 2 DAYS, 3 X 2 DAYS, 2 X 2 DAYS, 1 X 2 DAYS

## 2013-05-22 NOTE — Telephone Encounter (Signed)
Called spoke with pt. He would like the prednisone called in. If he is not feeling better then he will call back cough syrup. Nothing further needed and RX called in.

## 2013-05-23 ENCOUNTER — Telehealth: Payer: Self-pay | Admitting: Internal Medicine

## 2013-05-23 DIAGNOSIS — J449 Chronic obstructive pulmonary disease, unspecified: Secondary | ICD-10-CM

## 2013-05-23 NOTE — Telephone Encounter (Signed)
Called and spoke with pt and he stated that he has been using the oxygen with his cpap at night only.  He stated that this spring has been really bad.  He stated that last week he was given pred 20 mg  1 daily x 5 days and he stated this helped.  Yesterday we sent in a pred taper in and he stated that this has helped more.  He stated that he is coughing ---when this started he was coughing up green sputum but now this has cleared to all white sputum.  Pt stated that when he has these coughing spells he takes an oxygen tank to the truck and puts this on 1.5 liters and it seems to help him.  He wanted to know if this was ok to do or does CY have any further recs for the pt.  He is aware that we will call him tomorrow.  CY please advise. Thanks  Last ov--03/13/13 Next ov--07/10/13  Allergies  Allergen Reactions  . Antihistamines, Loratadine-Type     depression  . Meclizine     depression  . Other     Beta blockers-Novacaine depression     Current Outpatient Prescriptions on File Prior to Visit  Medication Sig Dispense Refill  . allopurinol (ZYLOPRIM) 100 MG tablet Take 100 mg by mouth daily.       . citalopram (CELEXA) 40 MG tablet Take 40 mg by mouth every morning.      . diltiazem (DILACOR XR) 120 MG 24 hr capsule Take 120 mg by mouth at bedtime.      . furosemide (LASIX) 40 MG tablet Take 20 mg by mouth daily.       Marland Kitchen glyBURIDE (DIABETA) 5 MG tablet Take 10 mg by mouth 2 (two) times daily with a meal.       . iron polysaccharides (NIFEREX) 150 MG capsule Take 150 mg by mouth 2 (two) times daily.      Marland Kitchen lovastatin (MEVACOR) 20 MG tablet Take 20 mg by mouth every morning.       . metFORMIN (GLUCOPHAGE) 500 MG tablet Take 1,000 mg by mouth 2 (two) times daily with a meal.       . potassium chloride SA (K-DUR,KLOR-CON) 20 MEQ tablet Take 20 mEq by mouth daily.      . predniSONE (DELTASONE) 10 MG tablet Take 4 tabs daily x 2 days, 3 tabs daily x 2 days, 2 tabs daily x 2 days, 1 tab daily x 2 days  then stop  20 tablet  0  . predniSONE (DELTASONE) 20 MG tablet Take 1 tablet (20 mg total) by mouth daily with breakfast.  5 tablet  0  . testosterone cypionate (DEPOTESTOTERONE CYPIONATE) 100 MG/ML injection Inject 100 mg into the muscle every 8 (eight) weeks. For IM use only      . valsartan (DIOVAN) 160 MG tablet Take 160 mg by mouth every morning.       . warfarin (COUMADIN) 5 MG tablet Take 5 mg by mouth every evening. every day except Wednesday 7.5 mg      . zolpidem (AMBIEN) 10 MG tablet Take 5 mg by mouth at bedtime as needed for sleep.        No current facility-administered medications on file prior to visit.

## 2013-05-24 NOTE — Telephone Encounter (Signed)
He has O2 for sleep, and can use his standby tank occasionally, but I don't think we have qualified him for portable O2, which would be much easier for him.  Please see if we can get him in for a 6 minute walk    Dx COPD

## 2013-05-24 NOTE — Telephone Encounter (Signed)
lmomtcb x1 for pt 

## 2013-05-25 NOTE — Telephone Encounter (Signed)
Pt aware of recs per CDY Pt scheduled for 6MWT 05-29-13 at 3pm Order placed.

## 2013-05-29 ENCOUNTER — Ambulatory Visit (INDEPENDENT_AMBULATORY_CARE_PROVIDER_SITE_OTHER): Payer: Medicare Other | Admitting: Internal Medicine

## 2013-05-29 DIAGNOSIS — J449 Chronic obstructive pulmonary disease, unspecified: Secondary | ICD-10-CM

## 2013-05-29 DIAGNOSIS — R0609 Other forms of dyspnea: Principal | ICD-10-CM

## 2013-05-30 DIAGNOSIS — Z125 Encounter for screening for malignant neoplasm of prostate: Secondary | ICD-10-CM | POA: Diagnosis not present

## 2013-05-30 DIAGNOSIS — E782 Mixed hyperlipidemia: Secondary | ICD-10-CM | POA: Diagnosis not present

## 2013-05-30 DIAGNOSIS — N182 Chronic kidney disease, stage 2 (mild): Secondary | ICD-10-CM | POA: Diagnosis not present

## 2013-05-30 DIAGNOSIS — G609 Hereditary and idiopathic neuropathy, unspecified: Secondary | ICD-10-CM | POA: Diagnosis not present

## 2013-05-30 DIAGNOSIS — I4891 Unspecified atrial fibrillation: Secondary | ICD-10-CM | POA: Diagnosis not present

## 2013-05-30 DIAGNOSIS — Z1331 Encounter for screening for depression: Secondary | ICD-10-CM | POA: Diagnosis not present

## 2013-05-30 DIAGNOSIS — E1149 Type 2 diabetes mellitus with other diabetic neurological complication: Secondary | ICD-10-CM | POA: Diagnosis not present

## 2013-05-30 DIAGNOSIS — Z0000 Encounter for general adult medical examination without abnormal findings: Secondary | ICD-10-CM | POA: Diagnosis not present

## 2013-05-30 DIAGNOSIS — I10 Essential (primary) hypertension: Secondary | ICD-10-CM | POA: Diagnosis not present

## 2013-06-04 DIAGNOSIS — E291 Testicular hypofunction: Secondary | ICD-10-CM | POA: Diagnosis not present

## 2013-06-04 DIAGNOSIS — R972 Elevated prostate specific antigen [PSA]: Secondary | ICD-10-CM | POA: Diagnosis not present

## 2013-06-04 DIAGNOSIS — N400 Enlarged prostate without lower urinary tract symptoms: Secondary | ICD-10-CM | POA: Diagnosis not present

## 2013-06-08 ENCOUNTER — Encounter: Payer: Self-pay | Admitting: *Deleted

## 2013-06-11 ENCOUNTER — Ambulatory Visit: Payer: No Typology Code available for payment source | Admitting: Neurology

## 2013-06-25 NOTE — Progress Notes (Signed)
Documentation for 6 minute walk test 

## 2013-07-09 ENCOUNTER — Ambulatory Visit (INDEPENDENT_AMBULATORY_CARE_PROVIDER_SITE_OTHER): Payer: Medicare Other | Admitting: Pharmacist

## 2013-07-09 DIAGNOSIS — I4891 Unspecified atrial fibrillation: Secondary | ICD-10-CM

## 2013-07-09 LAB — POCT INR: INR: 2.3

## 2013-07-10 ENCOUNTER — Ambulatory Visit (INDEPENDENT_AMBULATORY_CARE_PROVIDER_SITE_OTHER): Payer: Medicare Other | Admitting: Internal Medicine

## 2013-07-10 ENCOUNTER — Encounter: Payer: Self-pay | Admitting: Internal Medicine

## 2013-07-10 VITALS — BP 136/58 | HR 64 | Ht 72.5 in | Wt 265.0 lb

## 2013-07-10 DIAGNOSIS — G4733 Obstructive sleep apnea (adult) (pediatric): Secondary | ICD-10-CM | POA: Diagnosis not present

## 2013-07-10 DIAGNOSIS — E662 Morbid (severe) obesity with alveolar hypoventilation: Secondary | ICD-10-CM

## 2013-07-10 MED ORDER — TEMAZEPAM 15 MG PO CAPS: ORAL_CAPSULE | ORAL | Status: DC

## 2013-07-10 NOTE — Progress Notes (Signed)
Subjective:    Patient ID: Joel [name], male    DOB: 1932-03-07, 80 y.o.   MRN: [id number]  HPI 07/13/10- OSA Last here 06/24/09 His BiPAP machine had done well till it failed. Advanced BIPAP 18/14. We had gotten an update sleep study in 2010 to qualfy for new machine then. Now needs to document face- to - face. He is tolerating new machine well. Feels well rested. Not snoring through now.  Very dry mouth. Uses nasal saline with eucalyptus drops for dry nose.   09/07/12- 79 yoM former smoker followed for OSA, chronic bronchitis, allergic rhinitis, complicated by DM, AFib Advanced BIPAP 18/14.    Friend here NPSG 01/21/09- Severe OSA- AHI 102.8/ hr  Weight 280 lbs.. Sleeping 10-12 hours a day and waking unrefreshed. Felt better wearing oxygen, last time he was in hospital. He asks that while home oxygen. Easier dyspnea on exertion carrying groceries.  11/07/12- 79 yoM former smoker followed for OSA, chronic bronchitis, allergic rhinitis, complicated by DM, AFib Advanced BIPAP 18/14.    Friend here FOLLOWS FOR: Wears BiPAP 18/14 O2 2L/ Advanced every night for about 10-11 hours; pressure working well for patient;  Pt states he still wakes up tired(has gotten worse since 09-2012 visit) Have a cardiology workup by Dr. Tamala Julian, pending echo. Had chest x-ray and EKG at [facility], never told he was anemic. Dyspnea and some nausea lasted 2 or 3 minutes after walking test that office which really stressed him. Resolved with rest. No pain or palpitation. Oxygen saturation was 95% at heart rate 99 at the end of the walk. Saturations 95-96% range at home using his wife's oximeter.  03/13/13- 80 yoM former smoker followed for OSA, chronic bronchitis, allergic rhinitis, complicated by DM, AFib, anemia, heart murmur FOLLOWS FOR: Wears BiPAP 18/14 w O2 2L/ Advanced every night for about 8-9 hours; pressure works well for patient. Says these really help.   Dr. Tamala Julian has seen for heart murmur. Dr Deforest Hoyles has seen for  anemia treated with iron. Endoscopy negative. Echo 11/21/12- mild PHTN, Mild -mod AS, nl EF PFT 11/29/2012-mild obstructive airways disease with response to bronchodilator, FVC 3.45/77%, FEV1 2.43/75%, FEV1/FVC 0.70, FEF 25-75% 1.98/87%. TLC 81%, DLCO 61%.  07/10/13- 32 yoM former smoker followed for OSA, chronic bronchitis, allergic rhinitis, complicated by DM, AFib, anemia, heart murmur FOLLOWS FOR: Pt states he is wearing BiPAP 18/14 w O2 2L/ Advanced nightly for 8-9 hours/night.  Pt states he is not sleeping well d/t wife being in nursing facility and stress, c/o daytime fatigue. Pt denies issues with machine, pressure and mask. Pt fell asleep during work-up.  Using one half zolpidem tablet which isn't quite enough, but a whole tablet causes nightmares  ROS-see HPI Constitutional:   No-   weight loss, night sweats, fevers, chills, fatigue, lassitude. HEENT:   No-  headaches, difficulty swallowing, tooth/dental problems, sore throat,       No-  sneezing, itching, ear ache, nasal congestion, post nasal drip,  CV:  No-   chest pain, orthopnea, PND, swelling in lower extremities, anasarca,  dizziness, palpitations Resp: +  shortness of breath with exertion or at rest.              No-   productive cough,  No non-productive cough,  No- coughing up of blood.              No-   change in color of mucus.  No- wheezing.   Skin: No-   rash or lesions. GI:  No-   heartburn, indigestion, abdominal pain, nausea, vomiting,  GU: . MS:  No-   joint pain or swelling.  . Neuro-     nothing unusual Psych:  No- change in mood or affect. No depression or anxiety.  No memory loss.    Objective:  OBJ- Physical Exam General- Alert, Oriented, Affect-appropriate, Distress- none acute, obese Skin- rash-none, lesions- none, excoriation- none Lymphadenopathy- none Head- atraumatic            Eyes- Gross vision intact, PERRLA, conjunctivae and secretions clear            Ears- Hearing, canals-normal             Nose- Clear, no-Septal dev, mucus, polyps, erosion, perforation             Throat- Mallampati II , mucosa clear , drainage- none, tonsils- atrophic, +dentures Neck- flexible , trachea midline, no stridor , thyroid nl, carotid no bruit Chest - symmetrical excursion , unlabored           Heart/CV- RRR , 2/6 AS murmur , no gallop  , no rub, nl s1 s2                           - JVD- none , edema+1 L>R, stasis changes- none, varices- none           Lung- clear to P&A, wheeze- none, cough- none , dullness-none, rub- none           Chest wall-  Abd- + marked abdominal obesity Br/ Gen/ Rectal- Not done, not indicated Extrem- cyanosis- none, clubbing, none, atrophy- none, strength- nl Neuro- grossly intact to observation  Assessment & Plan:

## 2013-07-10 NOTE — Patient Instructions (Signed)
Stop zolpidem/ [employer] to try temazepam for sleep printed  I hope for the best for you and your wife. Please call if we can help

## 2013-07-24 DIAGNOSIS — H01009 Unspecified blepharitis unspecified eye, unspecified eyelid: Secondary | ICD-10-CM | POA: Diagnosis not present

## 2013-07-24 DIAGNOSIS — H43819 Vitreous degeneration, unspecified eye: Secondary | ICD-10-CM | POA: Diagnosis not present

## 2013-07-24 DIAGNOSIS — E11319 Type 2 diabetes mellitus with unspecified diabetic retinopathy without macular edema: Secondary | ICD-10-CM | POA: Diagnosis not present

## 2013-07-24 DIAGNOSIS — Z961 Presence of intraocular lens: Secondary | ICD-10-CM | POA: Diagnosis not present

## 2013-08-04 ENCOUNTER — Emergency Department (HOSPITAL_COMMUNITY): Payer: Medicare Other

## 2013-08-04 ENCOUNTER — Encounter (HOSPITAL_COMMUNITY): Payer: Self-pay | Admitting: Emergency Medicine

## 2013-08-04 ENCOUNTER — Observation Stay (HOSPITAL_COMMUNITY)
Admission: EM | Admit: 2013-08-04 | Discharge: 2013-08-05 | Disposition: A | Discharge status: Home / Self Care | Payer: Medicare Other | Attending: Internal Medicine | Admitting: Internal Medicine

## 2013-08-04 DIAGNOSIS — J449 Chronic obstructive pulmonary disease, unspecified: Secondary | ICD-10-CM | POA: Diagnosis not present

## 2013-08-04 DIAGNOSIS — R0609 Other forms of dyspnea: Secondary | ICD-10-CM

## 2013-08-04 DIAGNOSIS — F411 Generalized anxiety disorder: Secondary | ICD-10-CM | POA: Diagnosis not present

## 2013-08-04 DIAGNOSIS — M109 Gout, unspecified: Secondary | ICD-10-CM | POA: Diagnosis not present

## 2013-08-04 DIAGNOSIS — Z888 Allergy status to other drugs, medicaments and biological substances status: Secondary | ICD-10-CM | POA: Insufficient documentation

## 2013-08-04 DIAGNOSIS — Z79899 Other long term (current) drug therapy: Secondary | ICD-10-CM | POA: Diagnosis not present

## 2013-08-04 DIAGNOSIS — I10 Essential (primary) hypertension: Secondary | ICD-10-CM | POA: Diagnosis not present

## 2013-08-04 DIAGNOSIS — J984 Other disorders of lung: Secondary | ICD-10-CM | POA: Diagnosis not present

## 2013-08-04 DIAGNOSIS — Z87891 Personal history of nicotine dependence: Secondary | ICD-10-CM | POA: Diagnosis not present

## 2013-08-04 DIAGNOSIS — G4733 Obstructive sleep apnea (adult) (pediatric): Secondary | ICD-10-CM | POA: Diagnosis not present

## 2013-08-04 DIAGNOSIS — F319 Bipolar disorder, unspecified: Secondary | ICD-10-CM | POA: Insufficient documentation

## 2013-08-04 DIAGNOSIS — IMO0002 Reserved for concepts with insufficient information to code with codable children: Secondary | ICD-10-CM | POA: Insufficient documentation

## 2013-08-04 DIAGNOSIS — E785 Hyperlipidemia, unspecified: Secondary | ICD-10-CM | POA: Insufficient documentation

## 2013-08-04 DIAGNOSIS — I480 Paroxysmal atrial fibrillation: Secondary | ICD-10-CM

## 2013-08-04 DIAGNOSIS — E662 Morbid (severe) obesity with alveolar hypoventilation: Secondary | ICD-10-CM | POA: Diagnosis not present

## 2013-08-04 DIAGNOSIS — R42 Dizziness and giddiness: Secondary | ICD-10-CM | POA: Diagnosis not present

## 2013-08-04 DIAGNOSIS — Z862 Personal history of diseases of the blood and blood-forming organs and certain disorders involving the immune mechanism: Secondary | ICD-10-CM | POA: Diagnosis not present

## 2013-08-04 DIAGNOSIS — Z7901 Long term (current) use of anticoagulants: Secondary | ICD-10-CM | POA: Insufficient documentation

## 2013-08-04 DIAGNOSIS — E669 Obesity, unspecified: Secondary | ICD-10-CM | POA: Insufficient documentation

## 2013-08-04 DIAGNOSIS — D509 Iron deficiency anemia, unspecified: Secondary | ICD-10-CM | POA: Diagnosis present

## 2013-08-04 DIAGNOSIS — R110 Nausea: Secondary | ICD-10-CM | POA: Diagnosis not present

## 2013-08-04 DIAGNOSIS — E1149 Type 2 diabetes mellitus with other diabetic neurological complication: Secondary | ICD-10-CM | POA: Diagnosis not present

## 2013-08-04 DIAGNOSIS — Z5181 Encounter for therapeutic drug level monitoring: Secondary | ICD-10-CM

## 2013-08-04 DIAGNOSIS — I359 Nonrheumatic aortic valve disorder, unspecified: Secondary | ICD-10-CM | POA: Insufficient documentation

## 2013-08-04 DIAGNOSIS — I4891 Unspecified atrial fibrillation: Secondary | ICD-10-CM

## 2013-08-04 DIAGNOSIS — R079 Chest pain, unspecified: Secondary | ICD-10-CM | POA: Diagnosis not present

## 2013-08-04 DIAGNOSIS — Z9981 Dependence on supplemental oxygen: Secondary | ICD-10-CM | POA: Diagnosis not present

## 2013-08-04 DIAGNOSIS — E1142 Type 2 diabetes mellitus with diabetic polyneuropathy: Secondary | ICD-10-CM | POA: Diagnosis not present

## 2013-08-04 DIAGNOSIS — I350 Nonrheumatic aortic (valve) stenosis: Secondary | ICD-10-CM

## 2013-08-04 DIAGNOSIS — I209 Angina pectoris, unspecified: Secondary | ICD-10-CM

## 2013-08-04 DIAGNOSIS — E119 Type 2 diabetes mellitus without complications: Secondary | ICD-10-CM

## 2013-08-04 DIAGNOSIS — I482 Chronic atrial fibrillation, unspecified: Secondary | ICD-10-CM | POA: Diagnosis present

## 2013-08-04 DIAGNOSIS — J4489 Other specified chronic obstructive pulmonary disease: Secondary | ICD-10-CM | POA: Insufficient documentation

## 2013-08-04 DIAGNOSIS — R0989 Other specified symptoms and signs involving the circulatory and respiratory systems: Secondary | ICD-10-CM | POA: Insufficient documentation

## 2013-08-04 LAB — CBC
HCT: 33.6 % — ABNORMAL LOW (ref 39.0–52.0)
HEMOGLOBIN: 10.5 g/dL — AB (ref 13.0–17.0)
MCH: 27.1 pg (ref 26.0–34.0)
MCHC: 31.3 g/dL (ref 30.0–36.0)
MCV: 86.8 fL (ref 78.0–100.0)
Platelets: 171 10*3/uL (ref 150–400)
RBC: 3.87 MIL/uL — ABNORMAL LOW (ref 4.22–5.81)
RDW: 15.4 % (ref 11.5–15.5)
WBC: 5.7 10*3/uL (ref 4.0–10.5)

## 2013-08-04 LAB — BASIC METABOLIC PANEL
Anion gap: 15 (ref 5–15)
BUN: 16 mg/dL (ref 6–23)
CO2: 24 mEq/L (ref 19–32)
Calcium: 9 mg/dL (ref 8.4–10.5)
Chloride: 101 mEq/L (ref 96–112)
Creatinine, Ser: 0.91 mg/dL (ref 0.50–1.35)
GFR calc Af Amer: >90 mL/min (ref >90)
GFR calc non Af Amer: 79 mL/min — ABNORMAL LOW (ref >90)
GLUCOSE: 201 mg/dL — AB (ref 70–99)
Potassium: 4 mEq/L (ref 3.7–5.3)
SODIUM: 140 meq/L (ref 137–147)

## 2013-08-04 LAB — PROTIME-INR
INR: 2.33 — AB (ref 0.00–1.49)
Prothrombin Time: 25.6 seconds — ABNORMAL HIGH (ref 11.6–15.2)

## 2013-08-04 LAB — TROPONIN I: Troponin I: <0.3 ng/mL (ref <0.30)

## 2013-08-04 LAB — I-STAT TROPONIN, ED: Troponin i, poc: 0.01 ng/mL (ref 0.00–0.08)

## 2013-08-04 LAB — PRO B NATRIURETIC PEPTIDE: Pro B Natriuretic peptide (BNP): 386.4 pg/mL (ref 0–450)

## 2013-08-04 LAB — GLUCOSE, CAPILLARY: Glucose-Capillary: 87 mg/dL (ref 70–99)

## 2013-08-04 MED ORDER — IRBESARTAN 75 MG PO TABS
75.0000 mg | ORAL_TABLET | Freq: Every day | ORAL | Status: DC
Start: 2013-08-05 — End: 2013-08-06
  Administered 2013-08-05: 75 mg via ORAL
  Filled 2013-08-04: qty 1

## 2013-08-04 MED ORDER — WARFARIN SODIUM 5 MG PO TABS
5.0000 mg | ORAL_TABLET | Freq: Every day | ORAL | Status: AC
  Administered 2013-08-04: 5 mg via ORAL
  Filled 2013-08-04 (×2): qty 1

## 2013-08-04 MED ORDER — DILTIAZEM HCL ER 120 MG PO CP24
120.0000 mg | ORAL_CAPSULE | Freq: Every day | ORAL | Status: DC
  Administered 2013-08-04: 120 mg via ORAL
  Filled 2013-08-04 (×2): qty 1

## 2013-08-04 MED ORDER — ASPIRIN 81 MG PO CHEW
81.0000 mg | CHEWABLE_TABLET | Freq: Every day | ORAL | Status: DC
  Administered 2013-08-04 – 2013-08-05 (×2): 81 mg via ORAL
  Filled 2013-08-04 (×2): qty 1

## 2013-08-04 MED ORDER — DOCUSATE SODIUM 100 MG PO CAPS
200.0000 mg | ORAL_CAPSULE | Freq: Two times a day (BID) | ORAL | Status: DC
  Administered 2013-08-04 – 2013-08-05 (×2): 200 mg via ORAL
  Filled 2013-08-04 (×4): qty 2

## 2013-08-04 MED ORDER — GLYBURIDE 5 MG PO TABS
5.0000 mg | ORAL_TABLET | Freq: Two times a day (BID) | ORAL | Status: DC
  Administered 2013-08-04 – 2013-08-05 (×2): 5 mg via ORAL
  Filled 2013-08-04 (×4): qty 1

## 2013-08-04 MED ORDER — ZOLPIDEM TARTRATE 5 MG PO TABS
5.0000 mg | ORAL_TABLET | Freq: Every evening | ORAL | Status: DC | PRN
  Administered 2013-08-04: 5 mg via ORAL
  Filled 2013-08-04: qty 1

## 2013-08-04 MED ORDER — ACETAMINOPHEN 325 MG PO TABS: 650.0000 mg | ORAL_TABLET | ORAL | Status: DC | PRN

## 2013-08-04 MED ORDER — NITROGLYCERIN 0.4 MG SL SUBL: 0.4000 mg | SUBLINGUAL_TABLET | SUBLINGUAL | Status: DC | PRN

## 2013-08-04 MED ORDER — INSULIN ASPART 100 UNIT/ML ~~LOC~~ SOLN: 0.0000 [IU] | Freq: Every day | SUBCUTANEOUS | Status: DC

## 2013-08-04 MED ORDER — ALLOPURINOL 100 MG PO TABS
100.0000 mg | ORAL_TABLET | Freq: Every day | ORAL | Status: DC
  Administered 2013-08-05: 100 mg via ORAL
  Filled 2013-08-04 (×2): qty 1

## 2013-08-04 MED ORDER — CITALOPRAM HYDROBROMIDE 40 MG PO TABS
40.0000 mg | ORAL_TABLET | Freq: Every morning | ORAL | Status: DC
  Administered 2013-08-05: 40 mg via ORAL
  Filled 2013-08-04: qty 1

## 2013-08-04 MED ORDER — SIMVASTATIN 20 MG PO TABS
20.0000 mg | ORAL_TABLET | Freq: Every day | ORAL | Status: DC
  Filled 2013-08-04 (×2): qty 1

## 2013-08-04 MED ORDER — FUROSEMIDE 20 MG PO TABS
20.0000 mg | ORAL_TABLET | Freq: Every day | ORAL | Status: DC
  Administered 2013-08-05: 20 mg via ORAL
  Filled 2013-08-04: qty 1

## 2013-08-04 MED ORDER — NITROGLYCERIN 2 % TD OINT
0.5000 [in_us] | TOPICAL_OINTMENT | Freq: Four times a day (QID) | TRANSDERMAL | Status: DC
  Administered 2013-08-04: 0.5 [in_us] via TOPICAL
  Filled 2013-08-04: qty 1
  Filled 2013-08-04: qty 30

## 2013-08-04 MED ORDER — GI COCKTAIL ~~LOC~~: 30.0000 mL | Freq: Four times a day (QID) | ORAL | Status: DC | PRN

## 2013-08-04 MED ORDER — WARFARIN - PHARMACIST DOSING INPATIENT: Freq: Every day | Status: DC

## 2013-08-04 MED ORDER — ONDANSETRON HCL 4 MG/2ML IJ SOLN: 4.0000 mg | Freq: Four times a day (QID) | INTRAMUSCULAR | Status: DC | PRN

## 2013-08-04 MED ORDER — INSULIN ASPART 100 UNIT/ML ~~LOC~~ SOLN
0.0000 [IU] | Freq: Three times a day (TID) | SUBCUTANEOUS | Status: DC
  Administered 2013-08-05: 2 [IU] via SUBCUTANEOUS

## 2013-08-04 MED ORDER — NITROGLYCERIN 0.4 MG SL SUBL
0.4000 mg | SUBLINGUAL_TABLET | Freq: Once | SUBLINGUAL | Status: AC
  Administered 2013-08-04: 0.4 mg via SUBLINGUAL
  Filled 2013-08-04: qty 1

## 2013-08-04 NOTE — Consult Note (Signed)
Reason for Consult:chest pressure  Referring Physician: Dr. [name] is an 80 y.o. male.   HPI: The patient is a very pleasant 80 yo man who is followed by Dr. Tamala [name]. He has a long h/o atrial fibrillation and underwent a [facility] MAZE procedure in 1998. He has chronic COPD and wears oxygen and has sleep apnea and wears a CPAP mask at night. His wife died less than 2 weeks ago. He has had substernal chest pain. His symptoms have come on and off over the last 3 days. Symptoms are typical. He has associated sob.  PMH: Past Medical History  Diagnosis Date  . COPD (chronic obstructive pulmonary disease)   . Atrial fibrillation 1991    with multiple DCCV  . Type 2 diabetes mellitus   . Bipolar affective disorder   . Vertigo   . Anemia   . Gout   . Fatigue     last year or so  . OSA (obstructive sleep apnea)     severe, uses bipap 16 1/2 by 12 or 13 setting  . On home oxygen therapy 2 1/2 liters with bipap at night  . Hypertension   . Dyslipidemia   . Anxiety   . Insomnia   . Allergic rhinitis   . Obesity   . Diabetic neuropathy     in feet  . Aortic stenosis 2012    mild to mod     PSHX: Past Surgical History  Procedure Laterality Date  . Maze  1998  . Knee surgery  1970's    rt  . Shoulder surgery  7-8 yrs ago    rt  . Cardioversion  yrs ago  . Electro shock  1969    for depression  . Colonoscopy with propofol N/A 01/23/2013    Procedure: COLONOSCOPY WITH PROPOFOL;  Surgeon: Joel Fair, MD;  Location: [facility];  Service: Endoscopy;  Laterality: N/A;  . Esophagogastroduodenoscopy (egd) with propofol N/A 01/23/2013    Procedure: ESOPHAGOGASTRODUODENOSCOPY (EGD) WITH PROPOFOL;  Surgeon: Joel Fair, MD;  Location: [facility];  Service: Endoscopy;  Laterality: N/A;    FAMHX: Family History  Problem Relation Age of Onset  . Tuberculosis Maternal Grandmother     Social History:  reports that he quit smoking about 37 years ago. His smoking use  included Cigarettes. He has a 50 pack-year smoking history. He has never used smokeless tobacco. He reports that he does not drink alcohol or use illicit drugs.  Allergies:  Allergies  Allergen Reactions  . Antihistamines, Loratadine-Type     depression  . Meclizine     depression  . Other     Beta blockers-Novacaine depression    Medications: reviewed  Dg Chest 2 View  08/04/2013   CLINICAL DATA:  Chest pressure. Dizziness. Nausea. Current history of diabetes, hypertension and COPD.  EXAM: CHEST  2 VIEW  COMPARISON:  Portable chest x-ray 09/14/2010. Two-view chest x-ray 05/29/2009, 07/21/2005.  FINDINGS: AP semi-erect and lateral images were obtained. Prior sternotomy. Cardiac silhouette mildly to moderately enlarged but stable, allowing for differences in technique. Thoracic aorta atherosclerotic, unchanged. Hilar and mediastinal contours otherwise unremarkable. Stable hyperinflation. Prominent bronchovascular markings diffusely and mild central peribronchial thickening, unchanged. Stable linear scarring in the lower lobes. Lungs otherwise clear. No localized airspace consolidation. No pleural effusions. No pneumothorax. Normal pulmonary vascularity. Degenerative changes involving the thoracic spine. No significant interval change.  IMPRESSION: Stable cardiomegaly. COPD/emphysema. Stable scarring in the lower lobes. No acute cardiopulmonary disease.  Electronically Signed   By: Joel [name] M.D.   On: 08/04/2013 13:42    ROS  As stated in the HPI and negative for all other systems.  Physical Exam  Vitals:Blood pressure 126/73, pulse 69, temperature 98 F (36.7 C), temperature source Oral, resp. rate 20, height 6\' 1"  (1.854 m), weight 255 lb (115.667 kg), SpO2 95.00%.  Well appearing morbidly obese, NAD HEENT: Unremarkable Neck:  No JVD, no thyromegally Back:  No CVA tenderness Lungs:  Clear with no Wheezes and reduced breath sounds. HEART:  Regular rate rhythm, no murmurs, no  rubs, no clicks Abd:  obese, positive bowel sounds, no organomegally, no rebound, no guarding Ext:  2 plus pulses, no edema, no cyanosis, no clubbing Skin:  No rashes no nodules Neuro:  CN II through XII intact, motor grossly intact  ECG - probably fine atrial fib with a CVR, no acute STT changes.  Assessment/Plan: 1. Chest pain concerning for [place] 2. Atrial fib 3. Obesity. 4. HTN 5. COPD Rec: will admit for observation. Will plan a stress test to rule out ischemia.  Joel Overlie TaylorMD 08/04/2013, 4:38 PM

## 2013-08-04 NOTE — ED Notes (Signed)
Pt. Stated, I noticed a little bit of pain yesterday and it just keeps getting worse.  I've also had some dizziness and nausea.

## 2013-08-04 NOTE — Progress Notes (Signed)
Patient placed on Bipap for HS using patients settings from home.  16/12, 2 L 02 bleed in with FFM.  Patient is familiar with equipment and procedure.

## 2013-08-04 NOTE — ED Notes (Signed)
Md Zammit at bedside.

## 2013-08-04 NOTE — ED Provider Notes (Signed)
CSN: [id number]     Arrival date & time 08/04/13  1234 History   First MD Initiated Contact with Patient 08/04/13 1254     Chief Complaint  Patient presents with  . Chest Pain  . Dizziness  . Nausea     (Consider location/radiation/quality/duration/timing/severity/associated sxs/prior Treatment) Patient is a 80 y.o. male presenting with chest pain and dizziness. The history is provided by the patient (the pt complains of chest pain today).  Chest Pain Pain location:  L chest Pain quality: aching   Pain radiates to:  Does not radiate Pain radiates to the back: no   Pain severity:  Mild Onset quality:  Sudden Timing:  Intermittent Progression:  Waxing and waning Chronicity:  New Context: not breathing   Associated symptoms: dizziness   Associated symptoms: no abdominal pain, no back pain, no cough, no fatigue and no headache   Dizziness Associated symptoms: chest pain   Associated symptoms: no diarrhea and no headaches     Past Medical History  Diagnosis Date  . COPD (chronic obstructive pulmonary disease)   . Atrial fibrillation 1991    with multiple DCCV  . Type 2 diabetes mellitus   . Bipolar affective disorder   . Vertigo   . Anemia   . Gout   . Fatigue     last year or so  . OSA (obstructive sleep apnea)     severe, uses bipap 16 1/2 by 12 or 13 setting  . On home oxygen therapy 2 1/2 liters with bipap at night  . Hypertension   . Dyslipidemia   . Anxiety   . Insomnia   . Allergic rhinitis   . Obesity   . Diabetic neuropathy     in feet  . Aortic stenosis 2012    mild to mod    Past Surgical History  Procedure Laterality Date  . Maze  1998  . Knee surgery  1970's    rt  . Shoulder surgery  7-8 yrs ago    rt  . Cardioversion  yrs ago  . Electro shock  1969    for depression  . Colonoscopy with propofol N/A 01/23/2013    Procedure: COLONOSCOPY WITH PROPOFOL;  Surgeon: Garlan Fair, MD;  Location: [facility];  Service: Endoscopy;  Laterality:  N/A;  . Esophagogastroduodenoscopy (egd) with propofol N/A 01/23/2013    Procedure: ESOPHAGOGASTRODUODENOSCOPY (EGD) WITH PROPOFOL;  Surgeon: Garlan Fair, MD;  Location: [facility];  Service: Endoscopy;  Laterality: N/A;   Family History  Problem Relation Age of Onset  . Tuberculosis Maternal Grandmother    History  Substance Use Topics  . Smoking status: Former Smoker -- 2.00 packs/day for 25 years    Types: Cigarettes    Quit date: 07/08/1976  . Smokeless tobacco: Never Used  . Alcohol Use: No     Comment: in AA since 1977, no alcohol since 1977    Review of Systems  Constitutional: Negative for appetite change and fatigue.  HENT: Negative for congestion, ear discharge and sinus pressure.   Eyes: Negative for discharge.  Respiratory: Negative for cough.   Cardiovascular: Positive for chest pain.  Gastrointestinal: Negative for abdominal pain and diarrhea.  Genitourinary: Negative for frequency and hematuria.  Musculoskeletal: Negative for back pain.  Skin: Negative for rash.  Neurological: Positive for dizziness. Negative for seizures and headaches.  Psychiatric/Behavioral: Negative for hallucinations.      Allergies  Antihistamines, loratadine-type; Meclizine; and Other  Home Medications   Prior to Admission  medications   Medication Sig Start Date End Date Taking? Authorizing Provider  Acetaminophen (ARTHRITIS PAIN RELIEF PO) Take 2 tablets by mouth daily.   Yes Historical Provider, MD  allopurinol (ZYLOPRIM) 100 MG tablet Take 100 mg by mouth daily.    Yes Historical Provider, MD  citalopram (CELEXA) 40 MG tablet Take 40 mg by mouth every morning.   Yes Historical Provider, MD  Dextromethorphan-Guaifenesin (MUCINEX DM) 30-600 MG TB12 Take 1 tablet by mouth 2 (two) times daily.   Yes Historical Provider, MD  diltiazem (DILACOR XR) 120 MG 24 hr capsule Take 120 mg by mouth at bedtime.   Yes Historical Provider, MD  fluticasone (FLONASE) 50 MCG/ACT nasal spray  Place 1 spray into both nostrils 2 (two) times daily.   Yes Historical Provider, MD  furosemide (LASIX) 40 MG tablet Take 20 mg by mouth daily.    Yes Historical Provider, MD  glyBURIDE (DIABETA) 5 MG tablet Take 10 mg by mouth 2 (two) times daily with a meal.    Yes Historical Provider, MD  iron polysaccharides (NIFEREX) 150 MG capsule Take 150 mg by mouth 2 (two) times daily.   Yes Historical Provider, MD  lovastatin (MEVACOR) 20 MG tablet Take 20 mg by mouth every morning.    Yes Historical Provider, MD  metFORMIN (GLUCOPHAGE) 500 MG tablet Take 1,000 mg by mouth 2 (two) times daily with a meal.    Yes Historical Provider, MD  OVER THE COUNTER MEDICATION Take 1 tablet by mouth 2 (two) times daily. "Eye Vitamin"   Yes Historical Provider, MD  OVER THE COUNTER MEDICATION Place 1 drop into both eyes 3 (three) times daily. For dry eyes   Yes Historical Provider, MD  potassium chloride SA (K-DUR,KLOR-CON) 20 MEQ tablet Take 20 mEq by mouth daily.   Yes Historical Provider, MD  valsartan (DIOVAN) 160 MG tablet Take 160 mg by mouth every morning.    Yes Historical Provider, MD  warfarin (COUMADIN) 5 MG tablet Take 5 mg by mouth every evening.    Yes Historical Provider, MD  zolpidem (AMBIEN) 10 MG tablet Take 10 mg by mouth at bedtime as needed for sleep.   Yes Historical Provider, MD   BP 124/62  Pulse 66  Temp(Src) 98 F (36.7 C) (Oral)  Resp 20  Ht 6\' 1"  (1.854 m)  Wt 255 lb (115.667 kg)  BMI 33.65 kg/m2  SpO2 97% Physical Exam  Constitutional: He is oriented to person, place, and time. He appears well-developed.  HENT:  Head: Normocephalic.  Eyes: Conjunctivae and EOM are normal. No scleral icterus.  Neck: Neck supple. No thyromegaly present.  Cardiovascular: Normal rate and regular rhythm.  Exam reveals no gallop and no friction rub.   No murmur heard. Pulmonary/Chest: No stridor. He has no wheezes. He has no rales. He exhibits no tenderness.  Abdominal: He exhibits no distension.  There is no tenderness. There is no rebound.  Musculoskeletal: Normal range of motion. He exhibits no edema.  Lymphadenopathy:    He has no cervical adenopathy.  Neurological: He is oriented to person, place, and time. He exhibits normal muscle tone. Coordination normal.  Skin: No rash noted. No erythema.  Psychiatric: He has a normal mood and affect. His behavior is normal.    ED Course  Procedures (including critical care time) Labs Review Labs Reviewed  CBC - Abnormal; Notable for the following:    RBC 3.87 (*)    Hemoglobin 10.5 (*)    HCT 33.6 (*)  All other components within normal limits  BASIC METABOLIC PANEL - Abnormal; Notable for the following:    Glucose, Bld 201 (*)    GFR calc non Af Amer 79 (*)    All other components within normal limits  PROTIME-INR - Abnormal; Notable for the following:    Prothrombin Time 25.6 (*)    INR 2.33 (*)    All other components within normal limits  PRO B NATRIURETIC PEPTIDE  TROPONIN I  TROPONIN I  TROPONIN I  I-STAT TROPOININ, ED    Imaging Review Dg Chest 2 View  08/04/2013   CLINICAL DATA:  Chest pressure. Dizziness. Nausea. Current history of diabetes, hypertension and COPD.  EXAM: CHEST  2 VIEW  COMPARISON:  Portable chest x-ray 09/14/2010. Two-view chest x-ray 05/29/2009, 07/21/2005.  FINDINGS: AP semi-erect and lateral images were obtained. Prior sternotomy. Cardiac silhouette mildly to moderately enlarged but stable, allowing for differences in technique. Thoracic aorta atherosclerotic, unchanged. Hilar and mediastinal contours otherwise unremarkable. Stable hyperinflation. Prominent bronchovascular markings diffusely and mild central peribronchial thickening, unchanged. Stable linear scarring in the lower lobes. Lungs otherwise clear. No localized airspace consolidation. No pleural effusions. No pneumothorax. Normal pulmonary vascularity. Degenerative changes involving the thoracic spine. No significant interval change.   IMPRESSION: Stable cardiomegaly. COPD/emphysema. Stable scarring in the lower lobes. No acute cardiopulmonary disease.   Electronically Signed   By: Evangeline Dakin M.D.   On: 08/04/2013 13:42     EKG Interpretation None      MDM   Final diagnoses:  Chest pain, unspecified chest pain type  Atrial fibrillation, unspecified  Moderate aortic stenosis  Obesity hypoventilation syndrome  Obstructive sleep apnea (adult) (pediatric)  Type II or unspecified type diabetes mellitus without mention of complication, not stated as uncontrolled    Admit to triad,  Card to consult    Maudry Diego, MD 08/04/13 1615

## 2013-08-04 NOTE — ED Notes (Signed)
Contacted lab about wait time on BNP

## 2013-08-04 NOTE — H&P (Addendum)
Patient Demographics  Joel [name], is a 80 y.o. male  MRN: [id number]   DOB - 03-Mar-1932  Admit Date - 08/04/2013  Outpatient Primary MD for the patient is Wenda Low, MD  Cardiologist Dr. Tamala Julian   With History of -  Past Medical History  Diagnosis Date  . COPD (chronic obstructive pulmonary disease)   . Atrial fibrillation 1991    with multiple DCCV  . Type 2 diabetes mellitus   . Bipolar affective disorder   . Vertigo   . Anemia   . Gout   . Fatigue     last year or so  . OSA (obstructive sleep apnea)     severe, uses bipap 16 1/2 by 12 or 13 setting  . On home oxygen therapy 2 1/2 liters with bipap at night  . Hypertension   . Dyslipidemia   . Anxiety   . Insomnia   . Allergic rhinitis   . Obesity   . Diabetic neuropathy     in feet  . Aortic stenosis 2012    mild to mod       Past Surgical History  Procedure Laterality Date  . Maze  1998  . Knee surgery  1970's    rt  . Shoulder surgery  7-8 yrs ago    rt  . Cardioversion  yrs ago  . Electro shock  1969    for depression  . Colonoscopy with propofol N/A 01/23/2013    Procedure: COLONOSCOPY WITH PROPOFOL;  Surgeon: Garlan Fair, MD;  Location: [facility];  Service: Endoscopy;  Laterality: N/A;  . Esophagogastroduodenoscopy (egd) with propofol N/A 01/23/2013    Procedure: ESOPHAGOGASTRODUODENOSCOPY (EGD) WITH PROPOFOL;  Surgeon: Garlan Fair, MD;  Location: [facility];  Service: Endoscopy;  Laterality: N/A;    in for   Chief Complaint  Patient presents with  . Chest Pain  . Dizziness  . Nausea     HPI  Joel [name]  is a 80 y.o. male,  history of CAD status post CABG several years ago, history of paroxysmal atrial fibrillation status post failed Maze procedure on Coumadin, type 2 diabetes mellitus, hypertension,  dyslipidemia, moderate aortic stenosis, gout, bipolar disorder, obstructive sleep apnea uses BiPAP at night, chronic diastolic CHF on home oxygen, who presents to the hospital with one day history of intermittent substernal pressure-like sensation which at times radiates to his left arm, associated with nausea-shortness of breath and sweating episodes, pain started at yesterday, the interval between each episode has been decreasing gradually, intensity of pain is increasing, patient got worried that this could be another heart attack and presented to the ER.     In the ER initial EKG, blood work, chest x-ray and troponin were nonacute. He became chest pain-free after receiving sublingual nitroglycerin. Cardiology was consulted and hospitalist team was requested to admit the patient.    Review of Systems    In addition to the HPI above,   No  Fever-chills, No Headache, No changes with Vision or hearing, No problems swallowing food or Liquids, No Chest pain currently chest pain-free, Cough or Shortness of Breath, No Abdominal pain, No Nausea or Vommitting, Bowel movements are regular, No Blood in stool or Urine, No dysuria, No new skin rashes or bruises, No new joints pains-aches,  No new weakness, tingling, numbness in any extremity, No recent weight gain or loss, No polyuria, polydypsia or polyphagia, No significant Mental Stressors however says his wife passed away a few days ago   A full 10 point Review of Systems was done, except as stated above, all other Review of Systems were negative.   Social History History  Substance Use Topics  . Smoking status: Former Smoker -- 2.00 packs/day for 25 years    Types: Cigarettes    Quit date: 07/08/1976  . Smokeless tobacco: Never Used  . Alcohol Use: No     Comment: in AA since 1977, no alcohol since 1977      Family History Family History  Problem Relation Age of Onset  . Tuberculosis Maternal Grandmother      Prior to  Admission medications   Medication Sig Start Date End Date Taking? Authorizing Provider  Acetaminophen (ARTHRITIS PAIN RELIEF PO) Take 2 tablets by mouth daily.   Yes Historical Provider, MD  allopurinol (ZYLOPRIM) 100 MG tablet Take 100 mg by mouth daily.    Yes Historical Provider, MD  citalopram (CELEXA) 40 MG tablet Take 40 mg by mouth every morning.   Yes Historical Provider, MD  Dextromethorphan-Guaifenesin (MUCINEX DM) 30-600 MG TB12 Take 1 tablet by mouth 2 (two) times daily.   Yes Historical Provider, MD  diltiazem (DILACOR XR) 120 MG 24 hr capsule Take 120 mg by mouth at bedtime.   Yes Historical Provider, MD  fluticasone (FLONASE) 50 MCG/ACT nasal spray Place 1 spray into both nostrils 2 (two) times daily.   Yes Historical Provider, MD  furosemide (LASIX) 40 MG tablet Take 20 mg by mouth daily.    Yes Historical Provider, MD  glyBURIDE (DIABETA) 5 MG tablet Take 10 mg by mouth 2 (two) times daily with a meal.    Yes Historical Provider, MD  iron polysaccharides (NIFEREX) 150 MG capsule Take 150 mg by mouth 2 (two) times daily.   Yes Historical Provider, MD  lovastatin (MEVACOR) 20 MG tablet Take 20 mg by mouth every morning.    Yes Historical Provider, MD  metFORMIN (GLUCOPHAGE) 500 MG tablet Take 1,000 mg by mouth 2 (two) times daily with a meal.    Yes Historical Provider, MD  OVER THE COUNTER MEDICATION Take 1 tablet by mouth 2 (two) times daily. "Eye Vitamin"   Yes Historical Provider, MD  OVER THE COUNTER MEDICATION Place 1 drop into both eyes 3 (three) times daily. For dry eyes   Yes Historical Provider, MD  potassium chloride SA (K-DUR,KLOR-CON) 20 MEQ tablet Take 20 mEq by mouth daily.   Yes Historical Provider, MD  valsartan (DIOVAN) 160 MG tablet Take 160 mg by mouth every morning.    Yes Historical Provider, MD  warfarin (COUMADIN) 5 MG tablet Take 5 mg by mouth every evening.    Yes Historical Provider, MD  zolpidem (AMBIEN) 10 MG tablet Take 10 mg by mouth at bedtime as  needed for sleep.   Yes Historical Provider, MD    Allergies  Allergen Reactions  . Antihistamines, Loratadine-Type     depression  . Meclizine     depression  . Other  Beta blockers-Novacaine depression    Physical Exam  Vitals  Blood pressure 124/62, pulse 66, temperature 98 F (36.7 C), temperature source Oral, resp. rate 20, height 6\' 1"  (1.854 m), weight 115.667 kg (255 lb), SpO2 97.00%.   1. General elderly obese white male lying in bed in NAD,     2. Normal affect and insight, Not Suicidal or Homicidal, Awake Alert, Oriented X 3.  3. No F.N deficits, ALL C.Nerves Intact, Strength 5/5 all 4 extremities, Sensation intact all 4 extremities, Plantars down going.  4. Ears and Eyes appear Normal, Conjunctivae clear, PERRLA. Moist Oral Mucosa.  5. Supple Neck, No JVD, No cervical lymphadenopathy appriciated, No Carotid Bruits.  6. Symmetrical Chest wall movement, Good air movement bilaterally, CTAB.  7. RRR, No Gallops, Rubs or Murmurs, No Parasternal Heave.  8. Positive Bowel Sounds, Abdomen Soft, No tenderness, No organomegaly appriciated,No rebound -guarding or rigidity.  9.  No Cyanosis, Normal Skin Turgor, No Skin Rash or Bruise. Trace bipedal edema, left leg slightly more swollen than right  10. Good muscle tone,  joints appear normal , no effusions, Normal ROM.  11. No Palpable Lymph Nodes in Neck or Axillae     Data Review  CBC  Recent Labs Lab 08/04/13 1254  WBC 5.7  HGB 10.5*  HCT 33.6*  PLT 171  MCV 86.8  MCH 27.1  MCHC 31.3  RDW 15.4   ------------------------------------------------------------------------------------------------------------------  Chemistries   Recent Labs Lab 08/04/13 1254  NA 140  K 4.0  CL 101  CO2 24  GLUCOSE 201*  BUN 16  CREATININE 0.91  CALCIUM 9.0   ------------------------------------------------------------------------------------------------------------------ estimated creatinine clearance is  89.1 ml/min (by C-G formula based on Cr of 0.91). ------------------------------------------------------------------------------------------------------------------ No results found for this basename: TSH, T4TOTAL, FREET3, T3FREE, THYROIDAB,  in the last 72 hours   Coagulation profile  Recent Labs Lab 08/04/13 1322  INR 2.33*   ------------------------------------------------------------------------------------------------------------------- No results found for this basename: DDIMER,  in the last 72 hours -------------------------------------------------------------------------------------------------------------------  Cardiac Enzymes No results found for this basename: CK, CKMB, TROPONINI, MYOGLOBIN,  in the last 168 hours ------------------------------------------------------------------------------------------------------------------ No components found with this basename: POCBNP,    ---------------------------------------------------------------------------------------------------------------  Urinalysis No results found for this basename: colorurine, appearanceur, labspec, phurine, glucoseu, hgbur, bilirubinur, ketonesur, proteinur, urobilinogen, nitrite, leukocytesur    ----------------------------------------------------------------------------------------------------------------  Imaging results:   Dg Chest 2 View  08/04/2013   CLINICAL DATA:  Chest pressure. Dizziness. Nausea. Current history of diabetes, hypertension and COPD.  EXAM: CHEST  2 VIEW  COMPARISON:  Portable chest x-ray 09/14/2010. Two-view chest x-ray 05/29/2009, 07/21/2005.  FINDINGS: AP semi-erect and lateral images were obtained. Prior sternotomy. Cardiac silhouette mildly to moderately enlarged but stable, allowing for differences in technique. Thoracic aorta atherosclerotic, unchanged. Hilar and mediastinal contours otherwise unremarkable. Stable hyperinflation. Prominent bronchovascular markings  diffusely and mild central peribronchial thickening, unchanged. Stable linear scarring in the lower lobes. Lungs otherwise clear. No localized airspace consolidation. No pleural effusions. No pneumothorax. Normal pulmonary vascularity. Degenerative changes involving the thoracic spine. No significant interval change.  IMPRESSION: Stable cardiomegaly. COPD/emphysema. Stable scarring in the lower lobes. No acute cardiopulmonary disease.   Electronically Signed   By: Evangeline Dakin M.D.   On: 08/04/2013 13:42    My personal review of EKG: Rhythm ? junctional,  no Acute ST changes    Assessment & Plan   1. Chest pain. Concerning for unstable angina - history of CAD, chest pain resolved after sublingual nitroglycerin, he will be kept in 23 hour  observation on telemetry bed, will cycle troponins to rule out MI, we'll apply nitro paste, baby aspirin, sublingual nitroglycerin for breakthrough pain, continue Cardizem, cardiology has been consulted to see the patient shortly. Further testing per cardiology.   2. DM type II. Check A1c, hold Glucophage, got home dose Glucotrol to half dose as he will be n.p.o. after midnight, and sliding scale insulin. Monitor CBGs.   3. Obstructive sleep apnea. Home dose BiPAP to be continued.   4. Hypertension. No acute issues, continue home dose ARB and Cardizem combination. Monitor blood pressure and adjust medications as needed.   5. Dyslipidemia. Home dose statin to be continued.   6. Paroxysmal atrial fibrillation. Currently in sinus on tele, EKG ? junctional, telemetry monitor, Cardizem and Coumadin to be continued. Pharmacy will monitor in our and adjust Coumadin dose.   7. Left more than right leg swelling. Could be disproportionate edema due to mild acute on chronic diastolic CHF, however will check venous duplex to rule out DVT, off note he is already on Coumadin and anticoagulated.   8. Mild acute on chronic diastolic CHF. EF on last echo noted to  be preserved but not mentioned. Fluid and salt restriction, continue home dose Lasix.      DVT Prophylaxis Coumadin  AM Labs Ordered, also please review Full Orders  Family Communication: Admission, patients condition and plan of care including tests being ordered have been discussed with the  who indicates understanding and agree with the plan and Code Status.  Code Status DNR  Likely DC to  Home  Condition Fair  Time spent in minutes : 35    SINGH,PRASHANT K M.D on 08/04/2013 at 4:20 PM  Between 7am to 7pm - Pager - [phone]  After 7pm go to [email or web address] - password [email or web address]  And look for the night coverage person covering me after hours  [facility] Office  [phone]   **Disclaimer: This note may have been dictated with voice recognition software. Similar sounding words can inadvertently be transcribed and this note may contain transcription errors which may not have been corrected upon publication of note.**

## 2013-08-04 NOTE — ED Notes (Signed)
Cardiology at bedside.

## 2013-08-04 NOTE — ED Notes (Signed)
Attempted report x1. 

## 2013-08-04 NOTE — Progress Notes (Signed)
ANTICOAGULATION CONSULT NOTE - Initial Consult  Pharmacy Consult for coumadin  Indication: atrial fibrillation  Allergies  Allergen Reactions  . Antihistamines, Loratadine-Type     depression  . Meclizine     depression  . Other     Beta blockers-Novacaine depression    Patient Measurements: Height: 6\' 1"  (185.4 cm) Weight: 255 lb (115.667 kg) IBW/kg (Calculated) : 79.9  Vital Signs: Temp: 98 F (36.7 C) (07/04 1605) Temp src: Oral (07/04 1605) BP: 124/62 mmHg (07/04 1605) Pulse Rate: 66 (07/04 1605)  Labs:  Recent Labs  08/04/13 1254 08/04/13 1322  HGB 10.5*  --   HCT 33.6*  --   PLT 171  --   LABPROT  --  25.6*  INR  --  2.33*  CREATININE 0.91  --     Estimated Creatinine Clearance: 89.1 ml/min (by C-G formula based on Cr of 0.91).   Medical History: Past Medical History  Diagnosis Date  . COPD (chronic obstructive pulmonary disease)   . Atrial fibrillation 1991    with multiple DCCV  . Type 2 diabetes mellitus   . Bipolar affective disorder   . Vertigo   . Anemia   . Gout   . Fatigue     last year or so  . OSA (obstructive sleep apnea)     severe, uses bipap 16 1/2 by 12 or 13 setting  . On home oxygen therapy 2 1/2 liters with bipap at night  . Hypertension   . Dyslipidemia   . Anxiety   . Insomnia   . Allergic rhinitis   . Obesity   . Diabetic neuropathy     in feet  . Aortic stenosis 2012    mild to mod      Assessment: 80 y.o. male presenting with chest pain and dizziness. Patient is on coumadin at home for afib and pharmacy has been consulted to dose. INR today= 2.33  Home coumadin regimen:  5mg /day (last taken 7/3)   Goal of Therapy:  INR 2-3 Monitor platelets by anticoagulation protocol: Yes   Plan:  -Coumadin 5mg  po daily -Daily PT/INR for now  Hildred Laser, Pharm D 08/04/2013 4:25 PM

## 2013-08-05 ENCOUNTER — Observation Stay (HOSPITAL_COMMUNITY): Payer: Medicare Other

## 2013-08-05 DIAGNOSIS — E662 Morbid (severe) obesity with alveolar hypoventilation: Secondary | ICD-10-CM

## 2013-08-05 DIAGNOSIS — G4733 Obstructive sleep apnea (adult) (pediatric): Secondary | ICD-10-CM

## 2013-08-05 DIAGNOSIS — I359 Nonrheumatic aortic valve disorder, unspecified: Secondary | ICD-10-CM | POA: Diagnosis not present

## 2013-08-05 DIAGNOSIS — M7989 Other specified soft tissue disorders: Secondary | ICD-10-CM | POA: Diagnosis not present

## 2013-08-05 DIAGNOSIS — R079 Chest pain, unspecified: Secondary | ICD-10-CM | POA: Diagnosis not present

## 2013-08-05 DIAGNOSIS — I4891 Unspecified atrial fibrillation: Secondary | ICD-10-CM

## 2013-08-05 DIAGNOSIS — R0602 Shortness of breath: Secondary | ICD-10-CM | POA: Diagnosis not present

## 2013-08-05 DIAGNOSIS — I10 Essential (primary) hypertension: Secondary | ICD-10-CM | POA: Diagnosis not present

## 2013-08-05 DIAGNOSIS — E119 Type 2 diabetes mellitus without complications: Secondary | ICD-10-CM | POA: Diagnosis not present

## 2013-08-05 LAB — GLUCOSE, CAPILLARY
Glucose-Capillary: 178 mg/dL — ABNORMAL HIGH (ref 70–99)
Glucose-Capillary: 52 mg/dL — ABNORMAL LOW (ref 70–99)
Glucose-Capillary: 105 mg/dL — ABNORMAL HIGH (ref 70–99)
Glucose-Capillary: 112 mg/dL — ABNORMAL HIGH (ref 70–99)
Glucose-Capillary: 185 mg/dL — ABNORMAL HIGH (ref 70–99)

## 2013-08-05 LAB — HEMOGLOBIN A1C
HEMOGLOBIN A1C: 6.6 % — AB (ref <5.7)
MEAN PLASMA GLUCOSE: 143 mg/dL — AB (ref <117)

## 2013-08-05 LAB — TROPONIN I: Troponin I: <0.3 ng/mL (ref <0.30)

## 2013-08-05 MED ORDER — DEXTROSE 50 % IV SOLN
25.0000 mL | Freq: Once | INTRAVENOUS | Status: AC | PRN
  Administered 2013-08-05: 25 mL via INTRAVENOUS
  Filled 2013-08-05: qty 50

## 2013-08-05 MED ORDER — REGADENOSON 0.4 MG/5ML IV SOLN
0.4000 mg | Freq: Once | INTRAVENOUS | Status: AC
  Administered 2013-08-05: 0.4 mg via INTRAVENOUS

## 2013-08-05 MED ORDER — TECHNETIUM TC 99M SESTAMIBI - CARDIOLITE
30.0000 | Freq: Once | INTRAVENOUS | Status: AC | PRN
  Administered 2013-08-05: 12:00:00 30 via INTRAVENOUS

## 2013-08-05 MED ORDER — TECHNETIUM TC 99M SESTAMIBI - CARDIOLITE
10.0000 | Freq: Once | INTRAVENOUS | Status: AC | PRN
  Administered 2013-08-05: 10 via INTRAVENOUS

## 2013-08-05 MED ORDER — ATORVASTATIN CALCIUM 10 MG PO TABS
10.0000 mg | ORAL_TABLET | Freq: Every day | ORAL | Status: DC
  Administered 2013-08-05: 10 mg via ORAL
  Filled 2013-08-05: qty 1

## 2013-08-05 MED ORDER — REGADENOSON 0.4 MG/5ML IV SOLN
INTRAVENOUS | Status: AC
  Administered 2013-08-05: 0.4 mg
  Filled 2013-08-05: qty 5

## 2013-08-05 MED ORDER — WARFARIN SODIUM 5 MG PO TABS
5.0000 mg | ORAL_TABLET | Freq: Every day | ORAL | Status: DC
  Filled 2013-08-05: qty 1

## 2013-08-05 MED ORDER — WARFARIN SODIUM 5 MG PO TABS
5.0000 mg | ORAL_TABLET | Freq: Every day | ORAL | Status: DC
  Administered 2013-08-05: 5 mg via ORAL
  Filled 2013-08-05: qty 1

## 2013-08-05 NOTE — Discharge Summary (Signed)
Physician Discharge Summary  NAME:Joel [name]  XTK:[id number]  DOB: 1932/02/23   Admit date: 08/04/2013 Discharge date: 08/05/2013  Discharge Diagnoses:  Principal Problem:   Chest pain - low risk nuclear stress test. Probable atypical chest pain. Recommend continued medical management and discharge home with close follow up with Dr. Tamala Julian or APP in clinic in the next 2 weeks. Dr. Marlou Porch to set up appointment.   Active Problems:   DM w/o Complication Type II   OBSTRUCTIVE SLEEP APNEA   Atrial fibrillation   BRONCHITIS, CHRONIC   Obesity hypoventilation syndrome   Anemia, iron deficiency   Moderate aortic stenosis   Encounter for monitoring coumadin therapy   Discharge Physical Exam:  General Appearance: Alert, cooperative, no distress, appears stated age  Weight change:   Intake/Output Summary (Last 24 hours) at 08/05/13 1945 Last data filed at 08/05/13 1400  Gross per 24 hour  Intake    480 ml  Output      0 ml  Net    480 ml   Filed Vitals:   08/05/13 1141 08/05/13 1143 08/05/13 1145 08/05/13 1430  BP: 161/106 155/84 155/92 137/116  Pulse:    79  Temp:    97.5 F (36.4 C)  TempSrc:    Oral  Resp:    18  Height:      Weight:      SpO2:    99%    Discharge Condition: Improved  Hospital Course: Very pleasant 80 yo man who is followed by Dr. Tamala Julian. He has a long h/o atrial fibrillation and underwent a [facility] MAZE procedure in 1998. He has chronic COPD and wears oxygen and has sleep apnea and wears a CPAP mask at night. His wife died less than 2 weeks ago. He has had substernal chest pain on and off over the last 3 days. Symptoms are typical. He had associated sob. Pt was admitted and cardiac enzymes have been negative. ch theest pain has resolved. Today he had a low risk nuclear stress test. Probable atypical chest pain. Recommend continued medical management and discharge home with close follow up with Dr. Tamala Julian or APP in clinic in the next 2 weeks.   Things to follow  up in the outpatient setting: Call MD for recurrent chest pain or shortness of breath  Consults: Treatment Team:  Wenda Low, MD  Dr. Daneen Schick and Dr. Candee Furbish - [facility], MD  Disposition: 01-Home or Self Care     Medication List    ASK your doctor about these medications       allopurinol 100 MG tablet  Commonly known as:  ZYLOPRIM  Take 100 mg by mouth daily.     ARTHRITIS PAIN RELIEF PO  Take 2 tablets by mouth daily.     citalopram 40 MG tablet  Commonly known as:  CELEXA  Take 40 mg by mouth every morning.     diltiazem 120 MG 24 hr capsule  Commonly known as:  DILACOR XR  Take 120 mg by mouth at bedtime.     fluticasone 50 MCG/ACT nasal spray  Commonly known as:  FLONASE  Place 1 spray into both nostrils 2 (two) times daily.     furosemide 40 MG tablet  Commonly known as:  LASIX  Take 20 mg by mouth daily.     glyBURIDE 5 MG tablet  Commonly known as:  DIABETA  Take 10 mg by mouth 2 (two) times daily with a meal.     iron  polysaccharides 150 MG capsule  Commonly known as:  NIFEREX  Take 150 mg by mouth 2 (two) times daily.     lovastatin 20 MG tablet  Commonly known as:  MEVACOR  Take 20 mg by mouth every morning.     metFORMIN 500 MG tablet  Commonly known as:  GLUCOPHAGE  Take 1,000 mg by mouth 2 (two) times daily with a meal.     MUCINEX DM 30-600 MG Tb12  Take 1 tablet by mouth 2 (two) times daily.     OVER THE COUNTER MEDICATION  Take 1 tablet by mouth 2 (two) times daily. "Eye Vitamin"     OVER THE COUNTER MEDICATION  Place 1 drop into both eyes 3 (three) times daily. For dry eyes     potassium chloride SA 20 MEQ tablet  Commonly known as:  K-DUR,KLOR-CON  Take 20 mEq by mouth daily.     valsartan 160 MG tablet  Commonly known as:  DIOVAN  Take 160 mg by mouth every morning.     warfarin 5 MG tablet  Commonly known as:  COUMADIN  Take 5 mg by mouth every evening.     zolpidem 10 MG tablet  Commonly known as:   AMBIEN  Take 10 mg by mouth at bedtime as needed for sleep.         The results of significant diagnostics from this hospitalization (including imaging, microbiology, ancillary and laboratory) are listed below for reference.    Significant Diagnostic Studies: Dg Chest 2 View  08/04/2013   CLINICAL DATA:  Chest pressure. Dizziness. Nausea. Current history of diabetes, hypertension and COPD.  EXAM: CHEST  2 VIEW  COMPARISON:  Portable chest x-ray 09/14/2010. Two-view chest x-ray 05/29/2009, 07/21/2005.  FINDINGS: AP semi-erect and lateral images were obtained. Prior sternotomy. Cardiac silhouette mildly to moderately enlarged but stable, allowing for differences in technique. Thoracic aorta atherosclerotic, unchanged. Hilar and mediastinal contours otherwise unremarkable. Stable hyperinflation. Prominent bronchovascular markings diffusely and mild central peribronchial thickening, unchanged. Stable linear scarring in the lower lobes. Lungs otherwise clear. No localized airspace consolidation. No pleural effusions. No pneumothorax. Normal pulmonary vascularity. Degenerative changes involving the thoracic spine. No significant interval change.  IMPRESSION: Stable cardiomegaly. COPD/emphysema. Stable scarring in the lower lobes. No acute cardiopulmonary disease.   Electronically Signed   By: Evangeline Dakin M.D.   On: 08/04/2013 13:42   Nm Myocar Multi W/spect W/wall Motion / Ef  08/05/2013   CLINICAL DATA:  80 year old with current history of diabetes, hypertension, COPD and sleep apnea, moderate aortic stenosis by echocardiography, presenting this admission with chest pain.  EXAM: MYOCARDIAL IMAGING WITH SPECT (REST AND PHARMACOLOGIC-STRESS)  GATED LEFT VENTRICULAR WALL MOTION STUDY  LEFT VENTRICULAR EJECTION FRACTION  TECHNIQUE: Standard myocardial SPECT imaging was performed after resting intravenous injection of 10 mCi Tc-73m sestamibi. Subsequently, intravenous infusion of Lexiscan was performed under  the supervision of the Cardiology staff. At peak effect of the drug, 30 mCi Tc-20m sestamibi was injected intravenously and standard myocardial SPECT imaging was performed. Quantitative gated imaging was also performed to evaluate left ventricular wall motion, and estimate left ventricular ejection fraction.  COMPARISON:  None.  FINDINGS: Immediate post regadenoson images demonstrate diminished activity in the apex, inferior wall, and the mid and distal lateral wall. Initial resting images demonstrate reversibility to the apex and the lateral wall. These findings are not confirmed by the computer generated polar map, however. Reversibility was not present on the prior examination.  Gated images demonstrate mild global hypokinesis  without focal wall motion abnormality.  Estimated QGS left ventricular ejection fraction measured 48%, with an end-diastolic volume of 993 ml and an end systolic volume of 72 ml. The estimated QGS ejection fraction measured 47% on the prior examination.  IMPRESSION: 1. Discordant findings, with images demonstrating apical and lateral reversibility, not identified on the computer generated polar map. 2. Mild global hypokinesis without focal wall motion abnormality. 3. Estimated QGS ejection fraction 48%, unchanged since the prior examination in 2012.   Electronically Signed   By: Evangeline Dakin M.D.   On: 08/05/2013 14:54    Microbiology: No results found for this or any previous visit (from the past 240 hour(s)).   Labs: Results for orders placed during the hospital encounter of 08/04/13  CBC      Result Value Ref Range   WBC 5.7  4.0 - 10.5 K/uL   RBC 3.87 (*) 4.22 - 5.81 MIL/uL   Hemoglobin 10.5 (*) 13.0 - 17.0 g/dL   HCT 33.6 (*) 39.0 - 52.0 %   MCV 86.8  78.0 - 100.0 fL   MCH 27.1  26.0 - 34.0 pg   MCHC 31.3  30.0 - 36.0 g/dL   RDW 15.4  11.5 - 15.5 %   Platelets 171  150 - 400 K/uL  BASIC METABOLIC PANEL      Result Value Ref Range   Sodium 140  137 - 147 mEq/L    Potassium 4.0  3.7 - 5.3 mEq/L   Chloride 101  96 - 112 mEq/L   CO2 24  19 - 32 mEq/L   Glucose, Bld 201 (*) 70 - 99 mg/dL   BUN 16  6 - 23 mg/dL   Creatinine, Ser 0.91  0.50 - 1.35 mg/dL   Calcium 9.0  8.4 - 10.5 mg/dL   GFR calc non Af Amer 79 (*) >90 mL/min   GFR calc Af Amer >90  >90 mL/min   Anion gap 15  5 - 15  PRO B NATRIURETIC PEPTIDE      Result Value Ref Range   Pro B Natriuretic peptide (BNP) 386.4  0 - 450 pg/mL  PROTIME-INR      Result Value Ref Range   Prothrombin Time 25.6 (*) 11.6 - 15.2 seconds   INR 2.33 (*) 0.00 - 1.49  TROPONIN I      Result Value Ref Range   Troponin I <0.30  <0.30 ng/mL  TROPONIN I      Result Value Ref Range   Troponin I <0.30  <0.30 ng/mL  TROPONIN I      Result Value Ref Range   Troponin I <0.30  <0.30 ng/mL  GLUCOSE, CAPILLARY      Result Value Ref Range   Glucose-Capillary 87  70 - 99 mg/dL  HEMOGLOBIN A1C      Result Value Ref Range   Hemoglobin A1C 6.6 (*) <5.7 %   Mean Plasma Glucose 143 (*) <117 mg/dL  GLUCOSE, CAPILLARY      Result Value Ref Range   Glucose-Capillary 178 (*) 70 - 99 mg/dL  GLUCOSE, CAPILLARY      Result Value Ref Range   Glucose-Capillary 52 (*) 70 - 99 mg/dL  GLUCOSE, CAPILLARY      Result Value Ref Range   Glucose-Capillary 105 (*) 70 - 99 mg/dL  GLUCOSE, CAPILLARY      Result Value Ref Range   Glucose-Capillary 112 (*) 70 - 99 mg/dL  GLUCOSE, CAPILLARY      Result Value Ref  Range   Glucose-Capillary 185 (*) 70 - 99 mg/dL  I-STAT TROPOININ, ED      Result Value Ref Range   Troponin i, poc 0.01  0.00 - 0.08 ng/mL   Comment 3             Time coordinating discharge:33 minutes  Signed: Henrine Screws, MD 08/05/2013, 7:45 PM

## 2013-08-05 NOTE — Progress Notes (Signed)
VASCULAR LAB PRELIMINARY  PRELIMINARY  PRELIMINARY  PRELIMINARY  Left lower extremity venous Doppler completed.    Preliminary report:  There is no DVT or SVT noted in the left lower extremity.  Terie Lear, RVT 08/05/2013, 8:40 AM

## 2013-08-05 NOTE — Progress Notes (Signed)
Subjective: As per cardiology.  Down getting nuclear stress test currently.  We'll defer to cardiology for further workup  Objective: Weight change:   Intake/Output Summary (Last 24 hours) at 08/05/13 1046 Last data filed at 08/04/13 2100  Gross per 24 hour  Intake    240 ml  Output      0 ml  Net    240 ml   Filed Vitals:   08/04/13 1729 08/04/13 2100 08/04/13 2159 08/05/13 0500  BP: 133/69 130/67  109/45  Pulse: 70 66 72 57  Temp: 98 F (36.7 C) 97.7 F (36.5 C)  98 F (36.7 C)  TempSrc: Oral     Resp: _0 Height: _1  (1.854 m)     Weight: 260 lb 8 oz (118.162 kg)   261 lb (118.389 kg)  SpO2: 97% 98% 96% 99%    Lab Results: Results for orders placed during the hospital encounter of 08/04/13 (from the past 48 hour(s))  CBC     Status: Abnormal   Collection Time    08/04/13 12:54 PM      Result Value Ref Range   WBC 5.7  4.0 - 10.5 K/uL   RBC 3.87 (*) 4.22 - 5.81 MIL/uL   Hemoglobin 10.5 (*) 13.0 - 17.0 g/dL   HCT 33.6 (*) 39.0 - 52.0 %   MCV 86.8  78.0 - 100.0 fL   MCH 27.1  26.0 - 34.0 pg   MCHC 31.3  30.0 - 36.0 g/dL   RDW 15.4  11.5 - 15.5 %   Platelets 171  150 - 400 K/uL  BASIC METABOLIC PANEL     Status: Abnormal   Collection Time    08/04/13 12:54 PM      Result Value Ref Range   Sodium 140  137 - 147 mEq/L   Potassium 4.0  3.7 - 5.3 mEq/L   Chloride 101  96 - 112 mEq/L   CO2 24  19 - 32 mEq/L   Glucose, Bld 201 (*) 70 - 99 mg/dL   BUN 16  6 - 23 mg/dL   Creatinine, Ser 0.91  0.50 - 1.35 mg/dL   Calcium 9.0  8.4 - 10.5 mg/dL   GFR calc non Af Amer 79 (*) >90 mL/min   GFR calc Af Amer >90  >90 mL/min   Comment: (NOTE)     The eGFR has been calculated using the CKD EPI equation.     This calculation has not been validated in all clinical situations.     eGFR's persistently <90 mL/min signify possible Chronic Kidney     Disease.   Anion gap 15  5 - 15  PRO B NATRIURETIC PEPTIDE     Status: None   Collection Time    08/04/13 12:54 PM     Result Value Ref Range   Pro B Natriuretic peptide (BNP) 386.4  0 - 450 pg/mL  PROTIME-INR     Status: Abnormal   Collection Time    08/04/13  1:22 PM      Result Value Ref Range   Prothrombin Time 25.6 (*) 11.6 - 15.2 seconds   INR 2.33 (*) 0.00 - 1.49  I-STAT TROPOININ, ED     Status: None   Collection Time    08/04/13  1:42 PM      Result Value Ref Range   Troponin i, poc 0.01  0.00 - 0.08 ng/mL   Comment 3  Comment: Due to the release kinetics of cTnI,     a negative result within the first hours     of the onset of symptoms does not rule out     myocardial infarction with certainty.     If myocardial infarction is still suspected,     repeat the test at appropriate intervals.  TROPONIN I     Status: None   Collection Time    08/04/13  4:15 PM      Result Value Ref Range   Troponin I <0.30  <0.30 ng/mL   Comment:            Due to the release kinetics of cTnI,     a negative result within the first hours     of the onset of symptoms does not rule out     myocardial infarction with certainty.     If myocardial infarction is still suspected,     repeat the test at appropriate intervals.  GLUCOSE, CAPILLARY     Status: None   Collection Time    08/04/13  5:38 PM      Result Value Ref Range   Glucose-Capillary 87  70 - 99 mg/dL  GLUCOSE, CAPILLARY     Status: Abnormal   Collection Time    08/04/13  8:54 PM      Result Value Ref Range   Glucose-Capillary 178 (*) 70 - 99 mg/dL  TROPONIN I     Status: None   Collection Time    08/04/13 10:00 PM      Result Value Ref Range   Troponin I <0.30  <0.30 ng/mL   Comment:            Due to the release kinetics of cTnI,     a negative result within the first hours     of the onset of symptoms does not rule out     myocardial infarction with certainty.     If myocardial infarction is still suspected,     repeat the test at appropriate intervals.  TROPONIN I     Status: None   Collection Time    08/05/13  3:47 AM       Result Value Ref Range   Troponin I <0.30  <0.30 ng/mL   Comment:            Due to the release kinetics of cTnI,     a negative result within the first hours     of the onset of symptoms does not rule out     myocardial infarction with certainty.     If myocardial infarction is still suspected,     repeat the test at appropriate intervals.  GLUCOSE, CAPILLARY     Status: Abnormal   Collection Time    08/05/13  7:29 AM      Result Value Ref Range   Glucose-Capillary 52 (*) 70 - 99 mg/dL  GLUCOSE, CAPILLARY     Status: Abnormal   Collection Time    08/05/13  8:17 AM      Result Value Ref Range   Glucose-Capillary 105 (*) 70 - 99 mg/dL    Studies/Results: Dg Chest 2 View  08/04/2013   CLINICAL DATA:  Chest pressure. Dizziness. Nausea. Current history of diabetes, hypertension and COPD.  EXAM: CHEST  2 VIEW  COMPARISON:  Portable chest x-ray 09/14/2010. Two-view chest x-ray 05/29/2009, 07/21/2005.  FINDINGS: AP semi-erect and lateral images were obtained. Prior sternotomy. Cardiac silhouette mildly  to moderately enlarged but stable, allowing for differences in technique. Thoracic aorta atherosclerotic, unchanged. Hilar and mediastinal contours otherwise unremarkable. Stable hyperinflation. Prominent bronchovascular markings diffusely and mild central peribronchial thickening, unchanged. Stable linear scarring in the lower lobes. Lungs otherwise clear. No localized airspace consolidation. No pleural effusions. No pneumothorax. Normal pulmonary vascularity. Degenerative changes involving the thoracic spine. No significant interval change.  IMPRESSION: Stable cardiomegaly. COPD/emphysema. Stable scarring in the lower lobes. No acute cardiopulmonary disease.   Electronically Signed   By: Evangeline Dakin M.D.   On: 08/04/2013 13:42   Medications: Scheduled Meds: . allopurinol  100 mg Oral Daily  . aspirin  81 mg Oral Daily  . citalopram  40 mg Oral q morning - 10a  . diltiazem  120 mg  Oral QHS  . docusate sodium  200 mg Oral BID  . furosemide  20 mg Oral Daily  . glyBURIDE  5 mg Oral BID WC  . insulin aspart  0-5 Units Subcutaneous QHS  . insulin aspart  0-9 Units Subcutaneous TID WC  . irbesartan  75 mg Oral Daily  . nitroGLYCERIN  0.5 inch Topical 4 times per day  . simvastatin  20 mg Oral q1800  . warfarin  5 mg Oral q1800  . Warfarin - Pharmacist Dosing Inpatient   Does not apply q1800   Continuous Infusions:  PRN Meds:.acetaminophen, gi cocktail, nitroGLYCERIN, ondansetron (ZOFRAN) IV, technetium sestamibi, zolpidem  Assessment/Plan:  Principal Problem:  Chest pain  Active Problems:  DM w/o Complication Type II  OBSTRUCTIVE SLEEP APNEA  Atrial fibrillation  BRONCHITIS, CHRONIC  Obesity hypoventilation syndrome  Anemia, iron deficiency  Moderate aortic stenosis  Encounter for monitoring coumadin therapy   1. Chest pain-ordering nuclear stress test according to previous plan. Discussed with him this morning. N.p.o.  2. Obesity-encourage weight loss  3. Obstructive sleep apnea-continue with CPAP  4. Moderate aortic stenosis-last echocardiogram 2014 October. Murmur appreciated on exam.  5. Paroxysmal atrial fibrillation-Cox maze procedure 1998  6. Grieving-wife died 2 weeks ago. 7. Type 2 Diabetes Mellitus - blood sugars controlled 8. History of iron deficiency anemia - indices normal and hemoglobin greater than 10 9. anticoagulation therapy - INR therapeutic at 2.33   As per cardiology, disposition pending nuclear stress test.  If low-risk, potential discharge later today. He may need to be a 2 day study however.    LOS: 1 day   Henrine Screws, MD 08/05/2013, 10:46 AM

## 2013-08-05 NOTE — Progress Notes (Signed)
Nuclear Stress personally reviewed and overall felt to be low risk. There is possible inferior/ apical reversible changes but there is certainly diaphragmatic attenuation artifact. Based upon these finding, I would be comfortable with continued medical management and discharge home with close follow up with Dr. Tamala Julian or APP in clinic in the next 2 weeks. I will set up appointment.   I discussed the findings with Mr. Gonzaga and he is amenable to plan.   Also, if chest pain returns or becomes more worrisome, Dr. Tamala Julian can plan on diagnostic cath and hold his coumadin appropriately. Currently he is therapeutic on coumadin.   Discussed with nursing as well. She will contact Dr. Inda Merlin covering Dr. Lysle Rubens if Mr. Sistrunk desires to be discharged tonight.   Please let us know if we can be of further assistance.   Candee Furbish, MD

## 2013-08-05 NOTE — Progress Notes (Signed)
Cardiologist: Dr. Daneen Schick  Subjective:  Improved this AM, no active CP. His chest felt raw yesterday like when you have a cold he stated. He thought that when he had his BiPAP machine on with oxygen he felt better. When he took it off and turned to the side his chest pain returned he stated. No significant shortness of breath.  Objective:  Vital Signs in the last 24 hours: Temp:  [97.6 F (36.4 C)-98 F (36.7 C)] 98 F (36.7 C) (07/05 0500) Pulse Rate:  [57-92] 57 (07/05 0500) Resp:  [16-24] 18 (07/05 0500) BP: (109-140)/(45-82) 109/45 mmHg (07/05 0500) SpO2:  [94 %-99 %] 99 % (07/05 0500) Weight:  [255 lb (115.667 kg)-261 lb (118.389 kg)] 261 lb (118.389 kg) (07/05 0500)  Intake/Output from previous day: 07/04 0701 - 07/05 0700 In: 240 [P.O.:240] Out: -    Physical Exam: General: Well developed, well nourished, in no acute distress. Head:  Normocephalic and atraumatic. Lungs: Clear to auscultation and percussion. Heart: Normal S1 and S2.  2/6 systolic left upper sternal border murmur, rubs or gallops.  Abdomen: soft, non-tender, positive bowel sounds. Obese Extremities: No clubbing or cyanosis. No edema. Neurologic: Alert and oriented x 3.    Lab Results:  Recent Labs  08/04/13 1254  WBC 5.7  HGB 10.5*  PLT 171    Recent Labs  08/04/13 1254  NA 140  K 4.0  CL 101  CO2 24  GLUCOSE 201*  BUN 16  CREATININE 0.91    Recent Labs  08/04/13 2200 08/05/13 0347  TROPONINI <0.30 <0.30   Imaging: Dg Chest 2 View  08/04/2013   CLINICAL DATA:  Chest pressure. Dizziness. Nausea. Current history of diabetes, hypertension and COPD.  EXAM: CHEST  2 VIEW  COMPARISON:  Portable chest x-ray 09/14/2010. Two-view chest x-ray 05/29/2009, 07/21/2005.  FINDINGS: AP semi-erect and lateral images were obtained. Prior sternotomy. Cardiac silhouette mildly to moderately enlarged but stable, allowing for differences in technique. Thoracic aorta atherosclerotic, unchanged. Hilar  and mediastinal contours otherwise unremarkable. Stable hyperinflation. Prominent bronchovascular markings diffusely and mild central peribronchial thickening, unchanged. Stable linear scarring in the lower lobes. Lungs otherwise clear. No localized airspace consolidation. No pleural effusions. No pneumothorax. Normal pulmonary vascularity. Degenerative changes involving the thoracic spine. No significant interval change.  IMPRESSION: Stable cardiomegaly. COPD/emphysema. Stable scarring in the lower lobes. No acute cardiopulmonary disease.   Electronically Signed   By: Evangeline Dakin M.D.   On: 08/04/2013 13:42   Personally viewed.   Telemetry: No adverse arrhythmias Personally viewed.   EKG:  Sinus rhythm with nonspecific ST changes   Cardiac Studies:  11/21/12-normal ejection fraction, moderate LVH, moderate aortic stenosis  Assessment/Plan:  Principal Problem:   Chest pain Active Problems:   DM w/o Complication Type II   OBSTRUCTIVE SLEEP APNEA   Atrial fibrillation   BRONCHITIS, CHRONIC   Obesity hypoventilation syndrome   Anemia, iron deficiency   Moderate aortic stenosis   Encounter for monitoring coumadin therapy  1. Chest pain-ordering nuclear stress test according to previous plan. Discussed with him this morning. N.p.o.  2. Obesity-encourage weight loss  3. Obstructive sleep apnea-continue with CPAP  4. Moderate aortic stenosis-last echocardiogram 2014 October. Murmur appreciated on exam.  5. Paroxysmal atrial fibrillation-Cox maze procedure 1998  6. Grieving-wife died 2 weeks ago.  Disposition pending nuclear stress test. If low-risk, potential discharge later today. He may need to be a 2 day study however.   Jovaun Levene, [place] 08/05/2013, 7:56 AM

## 2013-08-16 ENCOUNTER — Encounter: Payer: Medicare Other | Admitting: Physician Assistant

## 2013-09-03 DIAGNOSIS — M25569 Pain in unspecified knee: Secondary | ICD-10-CM | POA: Diagnosis not present

## 2013-09-03 DIAGNOSIS — R079 Chest pain, unspecified: Secondary | ICD-10-CM | POA: Diagnosis not present

## 2013-09-03 DIAGNOSIS — M549 Dorsalgia, unspecified: Secondary | ICD-10-CM | POA: Diagnosis not present

## 2013-09-07 ENCOUNTER — Encounter: Payer: Medicare Other | Admitting: Physician Assistant

## 2013-09-09 NOTE — Assessment & Plan Note (Signed)
I think that control is good. He is waking up at night because of stress induced insomnia Plan-try temazepam 15 mg, one or 2 at night instead of zolpidem

## 2013-09-09 NOTE — Assessment & Plan Note (Signed)
Based on clinical impression noting his body habitus

## 2013-10-01 ENCOUNTER — Encounter: Payer: Self-pay | Admitting: Physician Assistant

## 2013-10-01 ENCOUNTER — Ambulatory Visit (INDEPENDENT_AMBULATORY_CARE_PROVIDER_SITE_OTHER): Payer: Medicare Other | Admitting: *Deleted

## 2013-10-01 ENCOUNTER — Ambulatory Visit (INDEPENDENT_AMBULATORY_CARE_PROVIDER_SITE_OTHER): Payer: Medicare Other | Admitting: Physician Assistant

## 2013-10-01 VITALS — BP 138/70 | HR 72 | Ht 73.0 in | Wt 268.0 lb

## 2013-10-01 DIAGNOSIS — R079 Chest pain, unspecified: Secondary | ICD-10-CM | POA: Diagnosis not present

## 2013-10-01 DIAGNOSIS — R0602 Shortness of breath: Secondary | ICD-10-CM

## 2013-10-01 DIAGNOSIS — I10 Essential (primary) hypertension: Secondary | ICD-10-CM | POA: Diagnosis not present

## 2013-10-01 DIAGNOSIS — I4891 Unspecified atrial fibrillation: Secondary | ICD-10-CM | POA: Diagnosis not present

## 2013-10-01 DIAGNOSIS — I482 Chronic atrial fibrillation, unspecified: Secondary | ICD-10-CM

## 2013-10-01 DIAGNOSIS — F3289 Other specified depressive episodes: Secondary | ICD-10-CM

## 2013-10-01 DIAGNOSIS — I350 Nonrheumatic aortic (valve) stenosis: Secondary | ICD-10-CM

## 2013-10-01 DIAGNOSIS — I359 Nonrheumatic aortic valve disorder, unspecified: Secondary | ICD-10-CM

## 2013-10-01 DIAGNOSIS — F329 Major depressive disorder, single episode, unspecified: Secondary | ICD-10-CM

## 2013-10-01 DIAGNOSIS — F32A Depression, unspecified: Secondary | ICD-10-CM

## 2013-10-01 LAB — BASIC METABOLIC PANEL
BUN: 14 mg/dL (ref 6–23)
CO2: 28 meq/L (ref 19–32)
CREATININE: 1.1 mg/dL (ref 0.4–1.5)
Calcium: 8.9 mg/dL (ref 8.4–10.5)
Chloride: 103 mEq/L (ref 96–112)
GFR: 66.55 mL/min (ref >60.00)
GLUCOSE: 181 mg/dL — AB (ref 70–99)
Potassium: 3.9 mEq/L (ref 3.5–5.1)
Sodium: 138 mEq/L (ref 135–145)

## 2013-10-01 LAB — POCT INR: INR: 2.2

## 2013-10-01 LAB — BRAIN NATRIURETIC PEPTIDE: Pro B Natriuretic peptide (BNP): 104 pg/mL — ABNORMAL HIGH (ref 0.0–100.0)

## 2013-10-01 NOTE — Progress Notes (Signed)
Cardiology Office Note    Date:  10/01/2013   ID:  Joel [name], DOB 03/19/1932, MRN [id number]  PCP:  Wenda Low, MD  Cardiologist:  Dr. Daneen Schick      History of Present Illness: Joel [name] is a 80 y.o. male chronic AFib (s/p Cox Maze in 1998), COPD, aortic stenosis, OSA, DM2, HTN, HL.  He was admited 08/2013 with chest pain.  CEs remained normal.  Inpatient myoview demonstrated apical and lateral reversibility and EF 48%.  This was reviewed by Dr. Candee Furbish who felt the study was low risk.  There was diaphragmatic attenuation noted as well.  Med Rx was recommended.  He returns for FU.    The patient has significant depression after the death of his wife last month. He has gone to counseling. He starts Hospice counseling later this week. He continues to struggle with dyspnea. He's had significant shortness of breath over the last 1-2 years. It seems to have worsened over the last few months. At first, he told me that he had not had further chest pain since his discharge. He later told me that he has been having chest discomfort since discharge. He notes that the chest discomfort is improved. It is not necessarily brought on by exertion. It is present all the time. He denies orthopnea. He sleeps with BiPAP. His LE edema is somewhat worse. He denies syncope.   Studies:   - Echo (10/14):  Normal LV function, no RWMA, mod LVH, mild to mod AS (mean 15 mmHg), mild MR, mod LAE, PASP 36 mmHg.    Recent Labs/Images: 08/04/2013: Creatinine 0.91; Hemoglobin 10.5*; Potassium 4.0; Pro B Natriuretic peptide (BNP) 386.4   Nm Myocar Multi W/spect W/wall Motion / Ef  08/05/2013    IMPRESSION: 1. Discordant findings, with images demonstrating apical and lateral reversibility, not identified on the computer generated polar map. 2. Mild global hypokinesis without focal wall motion abnormality. 3. Estimated QGS ejection fraction 48%, unchanged since the prior examination in 2012.   Electronically  Signed   By: Evangeline Dakin M.D.   On: 08/05/2013 14:54     Wt Readings from Last 3 Encounters:  08/05/13 261 lb (118.389 kg)  07/10/13 265 lb (120.203 kg)  04/17/13 269 lb (122.018 kg)     Past Medical History  Diagnosis Date  . COPD (chronic obstructive pulmonary disease)   . Atrial fibrillation 1991    with multiple DCCV  . Type 2 diabetes mellitus   . Bipolar affective disorder   . Vertigo   . Anemia   . Gout   . Fatigue     last year or so  . OSA (obstructive sleep apnea)     severe, uses bipap 16 1/2 by 12 or 13 setting  . On home oxygen therapy 2 1/2 liters with bipap at night  . Hypertension   . Dyslipidemia   . Anxiety   . Insomnia   . Allergic rhinitis   . Obesity   . Diabetic neuropathy     in feet  . Aortic stenosis 2012    mild to mod     Current Outpatient Prescriptions  Medication Sig Dispense Refill  . Acetaminophen (ARTHRITIS PAIN RELIEF PO) Take 2 tablets by mouth daily.      Marland Kitchen allopurinol (ZYLOPRIM) 100 MG tablet Take 100 mg by mouth daily.       . citalopram (CELEXA) 40 MG tablet Take 40 mg by mouth every morning.      Marland Kitchen  Dextromethorphan-Guaifenesin (MUCINEX DM) 30-600 MG TB12 Take 1 tablet by mouth 2 (two) times daily.      Marland Kitchen diltiazem (DILACOR XR) 120 MG 24 hr capsule Take 120 mg by mouth at bedtime.      . fluticasone (FLONASE) 50 MCG/ACT nasal spray Place 1 spray into both nostrils 2 (two) times daily.      . furosemide (LASIX) 40 MG tablet Take 20 mg by mouth daily.       Marland Kitchen glyBURIDE (DIABETA) 5 MG tablet Take 10 mg by mouth 2 (two) times daily with a meal.       . iron polysaccharides (NIFEREX) 150 MG capsule Take 150 mg by mouth 2 (two) times daily.      Marland Kitchen lovastatin (MEVACOR) 20 MG tablet Take 20 mg by mouth every morning.       . metFORMIN (GLUCOPHAGE) 500 MG tablet Take 1,000 mg by mouth 2 (two) times daily with a meal.       . OVER THE COUNTER MEDICATION Take 1 tablet by mouth 2 (two) times daily. "Eye Vitamin"      . OVER THE COUNTER  MEDICATION Place 1 drop into both eyes 3 (three) times daily. For dry eyes      . potassium chloride SA (K-DUR,KLOR-CON) 20 MEQ tablet Take 20 mEq by mouth daily.      . valsartan (DIOVAN) 160 MG tablet Take 160 mg by mouth every morning.       . warfarin (COUMADIN) 5 MG tablet Take 5 mg by mouth every evening.       . zolpidem (AMBIEN) 10 MG tablet Take 10 mg by mouth at bedtime as needed for sleep.       No current facility-administered medications for this visit.     Allergies:   Antihistamines, loratadine-type; Meclizine; and Other   Social History:  The patient  reports that he quit smoking about 37 years ago. His smoking use included Cigarettes. He has a 50 pack-year smoking history. He has never used smokeless tobacco. He reports that he does not drink alcohol or use illicit drugs.   Family History:  The patient's family history includes Tuberculosis in his maternal grandmother.   ROS:  Please see the history of present illness.   He denies suicidal ideations.   All other systems reviewed and negative.   PHYSICAL EXAM: VS:  BP 138/70  Pulse 72  Ht 6\' 1"  (1.854 m)  Wt 268 lb (121.564 kg)  BMI 35.37 kg/m2 Well nourished, well developed, in no acute distress HEENT: normal Neck: + JVD Cardiac:  normal S1, S2; RRR; no murmur Lungs:  Decreased breath sounds bilaterally, no wheezing, rhonchi or rales Abd: soft, nontender, no hepatomegaly Ext: 1-2+ tight bilateral LE edema Skin: warm and dry Neuro:  CNs 2-12 intact, no focal abnormalities noted  EKG:  Junctional rhythm with PACs vs AFib, HR 72     ASSESSMENT AND PLAN:  Shortness of breath:  His dyspnea is multifactorial and related to OSA, OHS, COPD.  I suspect he also has diastolic CHF contributing.  He has evidence of volume excess on exam.  I will increase his Lasix for several days.  I will check BMET, BNP today.  If his BNP is significantly elevated, I will continue his increased dose of Lasix longer.    Chest pain,  unspecified chest pain type:  Atypical.  Recent myoview low risk. I suspect his CP is related to volume overload.  Increase Lasix as above.  If chest  symptoms continue, we may need to consider cardiac cath.  I will bring him back on a day Dr. Daneen Schick is here to discuss.   Moderate aortic stenosis:  Consider repeat Echo 11/2013.  Chronic atrial fibrillation:  Continue coumadin.  EKG today suspicious for junctional rhythm with PACs vs AFib.  This is similar to prior tracings.   Unspecified essential hypertension:  Controlled.   Depression:  He noted significant depression to me today.  He stated at one point "I don't care anymore."  He assured me that he does not have suicidal ideations.  I encouraged him to FU with Hospice counseling this week as planned.   Disposition:  FU with Dr. Daneen Schick or me in 2 weeks.    Signed, Versie Starks, MHS 10/01/2013 1:40 PM    [facility] [address], [place], [place]  [address] Phone: [phone]; Fax: [phone]

## 2013-10-01 NOTE — Patient Instructions (Signed)
LAB WORK TODAY; BMET, BNP  REPEAT LAB WORK IN 1 WEEK; BMET  1. TOMORROW 10/02/13 TAKE LASIX 60 MG  2. THEN TAKE LASIX 40 MG FOR 2 DAYS  3. THEN GO BACK TO LASIX 20 MG DAILY  DURING THE INCREASE OF YOUR LASIX YOU WILL NEED TO INCREASE POTASSIUM TO 40 MEQ DAILY FOR 3 DAYS AFTER THE 3 DAYS YOU WILL GO BACK TO POTASSIUM 20 MEQ DAILY  Your physician recommends that you schedule a follow-up appointment in: 2-3 [place], PAC SAME DAY DR. Tamala Julian IS IN THE OFFICE.

## 2013-10-02 ENCOUNTER — Telehealth: Payer: Self-pay | Admitting: *Deleted

## 2013-10-02 DIAGNOSIS — R0609 Other forms of dyspnea: Principal | ICD-10-CM

## 2013-10-02 DIAGNOSIS — I482 Chronic atrial fibrillation, unspecified: Secondary | ICD-10-CM

## 2013-10-02 NOTE — Telephone Encounter (Signed)
pt notified about lab results with verbal understanding. Repeat bmet, bnp 9/9, pt aware.

## 2013-10-10 ENCOUNTER — Other Ambulatory Visit: Payer: Medicare Other

## 2013-10-16 ENCOUNTER — Ambulatory Visit: Payer: Medicare Other | Admitting: Physician Assistant

## 2013-11-09 ENCOUNTER — Ambulatory Visit
Admission: RE | Admit: 2013-11-09 | Discharge: 2013-11-09 | Disposition: A | Discharge status: Home / Self Care | Payer: Medicare Other | Source: Ambulatory Visit | Attending: Nurse Practitioner | Admitting: Nurse Practitioner

## 2013-11-09 ENCOUNTER — Other Ambulatory Visit: Payer: Self-pay | Admitting: Nurse Practitioner

## 2013-11-09 DIAGNOSIS — R600 Localized edema: Secondary | ICD-10-CM | POA: Diagnosis not present

## 2013-11-09 DIAGNOSIS — M1612 Unilateral primary osteoarthritis, left hip: Secondary | ICD-10-CM | POA: Diagnosis not present

## 2013-11-09 DIAGNOSIS — W19XXXA Unspecified fall, initial encounter: Secondary | ICD-10-CM | POA: Diagnosis not present

## 2013-11-09 DIAGNOSIS — M25552 Pain in left hip: Secondary | ICD-10-CM

## 2013-11-09 DIAGNOSIS — S79912A Unspecified injury of left hip, initial encounter: Secondary | ICD-10-CM | POA: Diagnosis not present

## 2013-11-20 DIAGNOSIS — E161 Other hypoglycemia: Secondary | ICD-10-CM | POA: Diagnosis not present

## 2013-11-20 DIAGNOSIS — E11649 Type 2 diabetes mellitus with hypoglycemia without coma: Secondary | ICD-10-CM | POA: Diagnosis not present

## 2013-11-20 DIAGNOSIS — I10 Essential (primary) hypertension: Secondary | ICD-10-CM | POA: Diagnosis not present

## 2013-11-20 DIAGNOSIS — I482 Chronic atrial fibrillation: Secondary | ICD-10-CM | POA: Diagnosis not present

## 2013-11-20 DIAGNOSIS — Z7901 Long term (current) use of anticoagulants: Secondary | ICD-10-CM | POA: Diagnosis not present

## 2013-11-20 DIAGNOSIS — M170 Bilateral primary osteoarthritis of knee: Secondary | ICD-10-CM | POA: Diagnosis not present

## 2013-11-27 DIAGNOSIS — J069 Acute upper respiratory infection, unspecified: Secondary | ICD-10-CM | POA: Diagnosis not present

## 2013-11-27 DIAGNOSIS — D509 Iron deficiency anemia, unspecified: Secondary | ICD-10-CM | POA: Diagnosis not present

## 2013-11-27 DIAGNOSIS — E1142 Type 2 diabetes mellitus with diabetic polyneuropathy: Secondary | ICD-10-CM | POA: Diagnosis not present

## 2013-11-29 DIAGNOSIS — M1711 Unilateral primary osteoarthritis, right knee: Secondary | ICD-10-CM | POA: Diagnosis not present

## 2013-11-29 DIAGNOSIS — M1712 Unilateral primary osteoarthritis, left knee: Secondary | ICD-10-CM | POA: Diagnosis not present

## 2014-01-01 DIAGNOSIS — I482 Chronic atrial fibrillation: Secondary | ICD-10-CM | POA: Diagnosis not present

## 2014-01-01 DIAGNOSIS — M1711 Unilateral primary osteoarthritis, right knee: Secondary | ICD-10-CM | POA: Diagnosis not present

## 2014-01-01 DIAGNOSIS — D509 Iron deficiency anemia, unspecified: Secondary | ICD-10-CM | POA: Diagnosis not present

## 2014-01-01 DIAGNOSIS — M1712 Unilateral primary osteoarthritis, left knee: Secondary | ICD-10-CM | POA: Diagnosis not present

## 2014-01-01 DIAGNOSIS — Z7901 Long term (current) use of anticoagulants: Secondary | ICD-10-CM | POA: Diagnosis not present

## 2014-01-03 DIAGNOSIS — H4311 Vitreous hemorrhage, right eye: Secondary | ICD-10-CM | POA: Diagnosis not present

## 2014-01-04 DIAGNOSIS — H3530 Unspecified macular degeneration: Secondary | ICD-10-CM | POA: Diagnosis not present

## 2014-01-04 DIAGNOSIS — H35362 Drusen (degenerative) of macula, left eye: Secondary | ICD-10-CM | POA: Diagnosis not present

## 2014-01-04 DIAGNOSIS — H4311 Vitreous hemorrhage, right eye: Secondary | ICD-10-CM | POA: Diagnosis not present

## 2014-01-16 DIAGNOSIS — M1711 Unilateral primary osteoarthritis, right knee: Secondary | ICD-10-CM | POA: Diagnosis not present

## 2014-01-16 DIAGNOSIS — M1712 Unilateral primary osteoarthritis, left knee: Secondary | ICD-10-CM | POA: Diagnosis not present

## 2014-01-18 DIAGNOSIS — H4311 Vitreous hemorrhage, right eye: Secondary | ICD-10-CM | POA: Diagnosis not present

## 2014-01-18 DIAGNOSIS — H35362 Drusen (degenerative) of macula, left eye: Secondary | ICD-10-CM | POA: Diagnosis not present

## 2014-01-21 DIAGNOSIS — Z7901 Long term (current) use of anticoagulants: Secondary | ICD-10-CM | POA: Diagnosis not present

## 2014-01-23 DIAGNOSIS — M1711 Unilateral primary osteoarthritis, right knee: Secondary | ICD-10-CM | POA: Diagnosis not present

## 2014-01-23 DIAGNOSIS — M1712 Unilateral primary osteoarthritis, left knee: Secondary | ICD-10-CM | POA: Diagnosis not present

## 2014-01-30 DIAGNOSIS — M1711 Unilateral primary osteoarthritis, right knee: Secondary | ICD-10-CM | POA: Diagnosis not present

## 2014-01-30 DIAGNOSIS — M1712 Unilateral primary osteoarthritis, left knee: Secondary | ICD-10-CM | POA: Diagnosis not present

## 2014-03-29 ENCOUNTER — Other Ambulatory Visit (HOSPITAL_COMMUNITY): Payer: Self-pay | Admitting: Ophthalmology

## 2014-03-29 DIAGNOSIS — H3582 Retinal ischemia: Secondary | ICD-10-CM

## 2014-04-02 ENCOUNTER — Ambulatory Visit (HOSPITAL_COMMUNITY): Payer: PPO | Attending: Internal Medicine

## 2014-04-09 ENCOUNTER — Ambulatory Visit (HOSPITAL_COMMUNITY)
Admission: RE | Admit: 2014-04-09 | Discharge: 2014-04-09 | Disposition: A | Discharge status: Home / Self Care | Payer: PPO | Source: Ambulatory Visit | Attending: Internal Medicine | Admitting: Internal Medicine

## 2014-04-09 DIAGNOSIS — I6523 Occlusion and stenosis of bilateral carotid arteries: Secondary | ICD-10-CM | POA: Insufficient documentation

## 2014-04-09 DIAGNOSIS — H3582 Retinal ischemia: Secondary | ICD-10-CM | POA: Insufficient documentation

## 2014-04-09 NOTE — Progress Notes (Signed)
VASCULAR LAB PRELIMINARY  PRELIMINARY  PRELIMINARY  PRELIMINARY  Doppler Carotid completed.    Preliminary report:  1-39% Carotid stenosis bilaterally.                                  >50% RT ECA stenosis.                                 Antegrade vertebral flow bilaterally.  August Albino, RVT 04/09/2014, 4:37 PM

## 2014-04-15 ENCOUNTER — Telehealth: Payer: Self-pay | Admitting: Interventional Cardiology

## 2014-04-15 ENCOUNTER — Telehealth: Payer: Self-pay | Admitting: Internal Medicine

## 2014-04-15 NOTE — Telephone Encounter (Signed)
New message      Pt sees Dr Tamala Julian.  He want [place] to recommend another cardiologist.  He cannot park as far as he has to park and walk to the building because of bad knees.  He want a referral to someone in another building.  Please call

## 2014-04-15 NOTE — Telephone Encounter (Signed)
lmomtcb x1 

## 2014-04-15 NOTE — Telephone Encounter (Signed)
Pt aware of recs.  Nothing further needed. 

## 2014-04-15 NOTE — Telephone Encounter (Signed)
lmtcb X1 for pt  

## 2014-04-15 NOTE — Telephone Encounter (Signed)
Ok to use the oxygen as needed until office visi. Not more than 2 L/Min

## 2014-04-15 NOTE — Telephone Encounter (Signed)
Patient having breathing problems. Patient uses Oxygen for night only, he has been using it during the day as well.  His oxygen level is 91% at RA.  He says he runs out of breath just walking to the mailbox.  Patient is scheduled to see Dr. Annamaria Boots on Friday, 04/19/14, would like to know if Dr. Annamaria Boots recommends anything to hold him over until he comes in on Friday.  Current Outpatient Prescriptions on File Prior to Visit  Medication Sig Dispense Refill  . Acetaminophen (ARTHRITIS PAIN RELIEF PO) Take 2 tablets by mouth daily.    Marland Kitchen allopurinol (ZYLOPRIM) 100 MG tablet Take 100 mg by mouth daily.     . citalopram (CELEXA) 40 MG tablet Take 40 mg by mouth every morning.    Marland Kitchen Dextromethorphan-Guaifenesin (MUCINEX DM) 30-600 MG TB12 Take 1 tablet by mouth 2 (two) times daily.    Marland Kitchen diltiazem (DILACOR XR) 120 MG 24 hr capsule Take 120 mg by mouth at bedtime.    . fluticasone (FLONASE) 50 MCG/ACT nasal spray Place 1 spray into both nostrils 2 (two) times daily.    . furosemide (LASIX) 40 MG tablet Take 20 mg by mouth daily.     Marland Kitchen glyBURIDE (DIABETA) 5 MG tablet Take 10 mg by mouth 2 (two) times daily with a meal.     . lovastatin (MEVACOR) 20 MG tablet Take 20 mg by mouth every morning.     . metFORMIN (GLUCOPHAGE) 500 MG tablet Take 1,000 mg by mouth 2 (two) times daily with a meal.     . OVER THE COUNTER MEDICATION Take 1 tablet by mouth 2 (two) times daily. "Eye Vitamin"    . OVER THE COUNTER MEDICATION Place 1 drop into both eyes 3 (three) times daily. For dry eyes    . potassium chloride SA (K-DUR,KLOR-CON) 20 MEQ tablet Take 20 mEq by mouth daily.    . valsartan (DIOVAN) 160 MG tablet Take 160 mg by mouth every morning.     . warfarin (COUMADIN) 5 MG tablet Take 5 mg by mouth every evening.     . zolpidem (AMBIEN) 10 MG tablet Take 10 mg by mouth at bedtime as needed for sleep.     No current facility-administered medications on file prior to visit.    Allergies  Allergen Reactions  .  Antihistamines, Loratadine-Type     depression  . Meclizine     depression  . Other     Beta blockers-Novacaine depression

## 2014-04-17 NOTE — Telephone Encounter (Signed)
Returned pt call Pt would prefer to be seen at one of our other locations for convenience Pt would prefer to be switched to the [place] office. Adv him that I will fwd our scheduler a message to call him and set an appointment  To establish with a Cardiologist in that office. Pt thanked me for my assistance FYI sent Dr.Smith

## 2014-04-19 ENCOUNTER — Encounter: Payer: Self-pay | Admitting: Internal Medicine

## 2014-04-19 ENCOUNTER — Ambulatory Visit (INDEPENDENT_AMBULATORY_CARE_PROVIDER_SITE_OTHER)
Admission: RE | Admit: 2014-04-19 | Discharge: 2014-04-19 | Disposition: A | Discharge status: Home / Self Care | Payer: PPO | Source: Ambulatory Visit | Attending: Internal Medicine | Admitting: Internal Medicine

## 2014-04-19 ENCOUNTER — Ambulatory Visit (INDEPENDENT_AMBULATORY_CARE_PROVIDER_SITE_OTHER): Payer: PPO | Admitting: Internal Medicine

## 2014-04-19 VITALS — BP 118/64 | HR 85 | Ht 72.5 in | Wt 268.8 lb

## 2014-04-19 DIAGNOSIS — J410 Simple chronic bronchitis: Secondary | ICD-10-CM

## 2014-04-19 DIAGNOSIS — J42 Unspecified chronic bronchitis: Secondary | ICD-10-CM

## 2014-04-19 DIAGNOSIS — R0609 Other forms of dyspnea: Secondary | ICD-10-CM | POA: Diagnosis not present

## 2014-04-19 DIAGNOSIS — G4733 Obstructive sleep apnea (adult) (pediatric): Secondary | ICD-10-CM

## 2014-04-19 MED ORDER — TIOTROPIUM BROMIDE-OLODATEROL 2.5-2.5 MCG/ACT IN AERS: 2.0000 | INHALATION_SPRAY | Freq: Every day | RESPIRATORY_TRACT | Status: DC

## 2014-04-19 NOTE — Patient Instructions (Addendum)
Order- Walk test room air for oximetry(pt unable to perform test today-will contact Katie to set up date and time once feeling better)             CXR-  Chronic bronchitis, dyspnea on exertion             Office spirometry(pt unable to perform test today-will contact Katie to set up date and time once feeling better)  Sample Stiolto Respimat    2.5       2 puffs, once daily   See if it helps your breathing

## 2014-04-19 NOTE — Progress Notes (Signed)
Subjective:    Patient ID: Joel [name], male    DOB: [birth year], 81 y.o.   MRN: [id number]  HPI 07/13/10- OSA Last here 06/24/09 His BiPAP machine had done well till it failed. Advanced BIPAP 18/14. We had gotten an update sleep study in 2010 to qualfy for new machine then. Now needs to document face- to - face. He is tolerating new machine well. Feels well rested. Not snoring through now.  Very dry mouth. Uses nasal saline with eucalyptus drops for dry nose.   09/07/12- 81 yoM former smoker followed for OSA, chronic bronchitis, allergic rhinitis, complicated by DM, AFib Advanced BIPAP 18/14.    Friend here NPSG 01/21/09- Severe OSA- AHI 102.8/ hr  Weight 280 lbs.. Sleeping 10-12 hours a day and waking unrefreshed. Felt better wearing oxygen, last time he was in hospital. He asks that while home oxygen. Easier dyspnea on exertion carrying groceries.  11/07/12- 81 yoM former smoker followed for OSA, chronic bronchitis, allergic rhinitis, complicated by DM, AFib Advanced BIPAP 18/14.    Friend here FOLLOWS FOR: Wears BiPAP 18/14 O2 2L/ Advanced every night for about 10-11 hours; pressure working well for patient;  Pt states he still wakes up tired(has gotten worse since 09-2012 visit) Have a cardiology workup by Dr. Tamala Julian, pending echo. Had chest x-ray and EKG at [facility], never told he was anemic. Dyspnea and some nausea lasted 2 or 3 minutes after walking test that office which really stressed him. Resolved with rest. No pain or palpitation. Oxygen saturation was 95% at heart rate 99 at the end of the walk. Saturations 95-96% range at home using his wife's oximeter.  03/13/13- 81 yoM former smoker followed for OSA, chronic bronchitis, allergic rhinitis, complicated by DM, AFib, anemia, heart murmur FOLLOWS FOR: Wears BiPAP 18/14 w O2 2L/ Advanced every night for about 8-9 hours; pressure works well for patient. Says these really help.   Dr. Tamala Julian has seen for heart murmur. Dr Deforest Hoyles has seen for  anemia treated with iron. Endoscopy negative. Echo 11/21/12- mild PHTN, Mild -mod AS, nl EF PFT 11/29/2012-mild obstructive airways disease with response to bronchodilator, FVC 3.45/77%, FEV1 2.43/75%, FEV1/FVC 0.70, FEF 25-75% 1.98/87%. TLC 81%, DLCO 61%.  07/10/13- 81 yoM former smoker followed for OSA, chronic bronchitis, allergic rhinitis, complicated by DM, AFib, anemia, heart murmur FOLLOWS FOR: Pt states he is wearing BiPAP 18/14 w O2 2L/ Advanced nightly for 8-9 hours/night.  Pt states he is not sleeping well d/t wife being in nursing facility and stress, c/o daytime fatigue. Pt denies issues with machine, pressure and mask. Pt fell asleep during work-up.  Using one half zolpidem tablet which isn't quite enough, but a whole tablet causes nightmares  04/19/14- 81 yoM former smoker followed for OSA, chronic bronchitis, allergic rhinitis, complicated by DM, AFib, anemia, heart murmur ACUTE VISIT: Increased SOB x several months; getting worse with activity. He is wearing BiPAP 18/14 w O2 2L/ Advanced nightly for 8-9 hours/night He reports very limited walking capability due to arthritis in his knees. His home oximeter recorded saturation 92% or better even when he is short of breath. NM heart study 08/2013-- EF 48% w wall motion abnormalities CXR 08/04/13 IMPRESSION: Stable cardiomegaly. COPD/emphysema. Stable scarring in the lower lobes. No acute cardiopulmonary disease. Electronically Signed  By: Evangeline Dakin M.D.  On: 08/04/2013 13:42  ROS-see HPI Constitutional:   No-   weight loss, night sweats, fevers, chills, fatigue, lassitude. HEENT:   No-  headaches, difficulty swallowing, tooth/dental problems, sore throat,  No-  sneezing, itching, ear ache, nasal congestion, post nasal drip,  CV:  No-   chest pain, orthopnea, PND, swelling in lower extremities, anasarca,  dizziness, palpitations Resp: +  shortness of breath with exertion or at rest.              No-   productive cough,   No non-productive cough,  No- coughing up of blood.              No-   change in color of mucus.  No- wheezing.   Skin: No-   rash or lesions. GI:  No-   heartburn, indigestion, abdominal pain, nausea, vomiting,  GU: . MS:  No-   joint pain or swelling.  . Neuro-     nothing unusual Psych:  No- change in mood or affect. No depression or anxiety.  No memory loss.    Objective:  OBJ- Physical Exam General- Alert, Oriented, Affect-appropriate, Distress- none acute,             +obese Skin- rash-none, lesions- none, excoriation- none Lymphadenopathy- none Head- atraumatic            Eyes- Gross vision intact, PERRLA, conjunctivae and secretions clear            Ears- Hearing, canals-normal            Nose- Clear, no-Septal dev, mucus, polyps, erosion, perforation             Throat- Mallampati II , mucosa clear , drainage- none, tonsils- atrophic, +dentures Neck- flexible , trachea midline, no stridor , thyroid nl, carotid no bruit Chest - symmetrical excursion , unlabored           Heart/CV- RRR , +2/6 AS murmur , no gallop  , no rub, nl s1 s2                           - JVD- none , edema-none, stasis changes- none, varices- none           Lung- clear to P&A, wheeze- none, cough- none , dullness-none, rub- none           Chest wall-  Abd- + marked abdominal obesity Br/ Gen/ Rectal- Not done, not indicated Extrem- cyanosis- none, clubbing, none, atrophy- none, strength- nl Neuro- grossly intact to observation  Assessment & Plan:

## 2014-04-21 NOTE — Assessment & Plan Note (Signed)
We need to reestablish his pulmonary status. Most of his dyspnea seems to be cardiogenic, and  related to deconditioning and also to obesity with hypoventilation Plan-sample Stiolto Respimat inhaler for trial, walk test on room air when he is able, chest x-ray, office spirometry. He will return for these when he can since he doesn't feel that he can manage them today.

## 2014-04-21 NOTE — Assessment & Plan Note (Signed)
Good control and compliance. He continues to use BiPAP with oxygen every night for sleep.

## 2014-04-22 NOTE — Telephone Encounter (Signed)
This would be fine with me. Thanks for the in follow.

## 2014-04-30 NOTE — Progress Notes (Signed)
Quick Note:  LMTCB on mobile number.  ATC home phone. Line rang several times without VM. ______

## 2014-05-01 NOTE — Progress Notes (Signed)
Quick Note:  Spoke with pt and notified of results per Dr. Wert. Pt verbalized understanding and denied any questions.  ______ 

## 2014-05-07 ENCOUNTER — Telehealth: Payer: Self-pay | Admitting: *Deleted

## 2014-05-07 NOTE — Telephone Encounter (Signed)
Left message for pt to return call to coumadin clinic [phone] as he has not been seen since August 31st 2015

## 2014-05-21 ENCOUNTER — Ambulatory Visit (INDEPENDENT_AMBULATORY_CARE_PROVIDER_SITE_OTHER): Payer: PPO | Admitting: Cardiology

## 2014-05-21 ENCOUNTER — Encounter: Payer: Self-pay | Admitting: Cardiology

## 2014-05-21 VITALS — BP 114/68 | HR 75 | Ht 72.0 in | Wt 260.0 lb

## 2014-05-21 DIAGNOSIS — I350 Nonrheumatic aortic (valve) stenosis: Secondary | ICD-10-CM | POA: Diagnosis not present

## 2014-05-21 DIAGNOSIS — I4891 Unspecified atrial fibrillation: Secondary | ICD-10-CM

## 2014-05-21 NOTE — Progress Notes (Signed)
Cardiology Office Note   Date:  05/21/2014   ID:  Joel [name], DOB [birth year], MRN [id number]  PCP:  Wenda Low, MD  Cardiologist:   Minus Breeding, MD   Chief Complaint  Patient presents with  . Atrial Fibrillation      History of Present Illness: Joel [name] is a 81 y.o. male who presents for evaluation of atrial fibrillation and aortic stenosis. He wants to move to this office for convenience. He has long-standing shortness of breath but he also has chronic lung disease. He did have an evaluation in the past 12 months that included a low risk perfusion study with probable artifact. He also had a BNP level was only slightly over 100. It was felt that his breathing difficulties were most likely pulmonary. He's had no acute exacerbation of this but he is chronically dyspneic. He wears oxygen Sedapap at night but he doesn't carry this with him usually. He's not describing PND or orthopnea. He's not describing palpitations, presyncope or syncope. He's actually had a weight loss since his wife died last year. He does have some lower extremity edema. He is very limited by his breathing but mostly by chronic joint pains. Of note he's had fibrillation with a Cox-Maze procedure cardioversions but has been intermittently in atrial fibrillation. Today he appears to be in a junctional rhythm. His last echocardiogram did demonstrate a well preserved ejection fraction with mild to moderate aortic stenosis. This was in October 2014. Stress perfusion study demonstrating apical thinning last year.   Past Medical History  Diagnosis Date  . COPD (chronic obstructive pulmonary disease)   . Atrial fibrillation 1991    with multiple DCCV  . Type 2 diabetes mellitus   . Bipolar affective disorder   . Vertigo   . Anemia   . Gout   . Fatigue     last year or so  . OSA (obstructive sleep apnea)     severe, uses bipap 16 1/2 by 12 or 13 setting  . On home oxygen therapy 2 1/2 liters with bipap  at night  . Hypertension   . Dyslipidemia   . Anxiety   . Insomnia   . Allergic rhinitis   . Obesity   . Diabetic neuropathy     in feet  . Aortic stenosis 2012    mild to mod     Past Surgical History  Procedure Laterality Date  . Maze  1998  . Knee surgery  1970's    rt  . Shoulder surgery  7-8 yrs ago    rt  . Cardioversion  yrs ago  . Electro shock  1969    for depression  . Colonoscopy with propofol N/A 01/23/2013    Procedure: COLONOSCOPY WITH PROPOFOL;  Surgeon: Garlan Fair, MD;  Location: [facility];  Service: Endoscopy;  Laterality: N/A;  . Esophagogastroduodenoscopy (egd) with propofol N/A 01/23/2013    Procedure: ESOPHAGOGASTRODUODENOSCOPY (EGD) WITH PROPOFOL;  Surgeon: Garlan Fair, MD;  Location: [facility];  Service: Endoscopy;  Laterality: N/A;     Current Outpatient Prescriptions  Medication Sig Dispense Refill  . Acetaminophen (ARTHRITIS PAIN RELIEF PO) Take 2 tablets by mouth daily.    Marland Kitchen allopurinol (ZYLOPRIM) 100 MG tablet Take 100 mg by mouth daily.     . citalopram (CELEXA) 40 MG tablet Take 40 mg by mouth every morning.    . Diclofenac Sodium 1.5 % SOLN Apply 1 application topically 2 (two) times daily.  0  .  diltiazem (DILACOR XR) 120 MG 24 hr capsule Take 120 mg by mouth at bedtime.    . fluticasone (FLONASE) 50 MCG/ACT nasal spray Place 1 spray into both nostrils 2 (two) times daily.    . furosemide (LASIX) 40 MG tablet Take 20 mg by mouth daily.     Marland Kitchen glyBURIDE (DIABETA) 5 MG tablet Take 10 mg by mouth 2 (two) times daily with a meal.     . lovastatin (MEVACOR) 20 MG tablet Take 20 mg by mouth every morning.     . metFORMIN (GLUCOPHAGE) 1000 MG tablet Take 1 tablet by mouth daily.  0  . OVER THE COUNTER MEDICATION Take 1 tablet by mouth 2 (two) times daily. "Eye Vitamin"    . OVER THE COUNTER MEDICATION Place 1 drop into both eyes 3 (three) times daily. For dry eyes    . temazepam (RESTORIL) 15 MG capsule Take 1 capsule by mouth at  bedtime as needed.  0  . Tiotropium Bromide-Olodaterol (STIOLTO RESPIMAT) 2.5-2.5 MCG/ACT AERS Inhale 2 puffs into the lungs daily. 1 Inhaler 0  . valsartan (DIOVAN) 160 MG tablet Take 160 mg by mouth every morning.     . warfarin (COUMADIN) 5 MG tablet Take 5 mg by mouth every evening.      No current facility-administered medications for this visit.    Allergies:   Antihistamines, loratadine-type; Meclizine; and Other    ROS:  Please see the history of present illness.   Otherwise, review of systems are positive for none.   All other systems are reviewed and negative.    PHYSICAL EXAM: VS:  BP 114/68 mmHg  Pulse 75  Ht 6' (1.829 m)  Wt 260 lb (117.935 kg)  BMI 35.25 kg/m2 , BMI Body mass index is 35.25 kg/(m^2). GENERAL:   Chronically ill appearing HEENT:  Pupils equal round and reactive, fundi not visualized, oral mucosa unremarkable NECK:  No jugular venous distention, waveform within normal limits, carotid upstroke brisk and symmetric, no bruits, no thyromegaly LYMPHATICS:  No cervical, inguinal adenopathy LUNGS:  Clear to auscultation bilaterally BACK:  No CVA tenderness CHEST:  Unremarkable HEART:  PMI not displaced or sustained,S1 and S2 within normal limits, no S3, no S4, no clicks, no rubs, 3/6 apical systolic murmur radiating out the outflow tract, no diastolic murmurs ABD:  Flat, positive bowel sounds normal in frequency in pitch, no bruits, no rebound, no guarding, no midline pulsatile mass, no hepatomegaly, no splenomegaly, obese EXT:  2 plus pulses upper, diminished DP/PT lower, moderate to severe edema, no cyanosis no clubbing SKIN:  No rashes no nodules, chronic venous stasis changes.  NEURO:  Cranial nerves II through XII grossly intact, motor grossly intact throughout PSYCH:  Cognitively intact, oriented to person place and time    EKG:  EKG is ordered today. The ekg ordered today demonstrates junctional rhythm, no P waves seen, premature ectopic complex, no  acute ST-T wave changes.   Recent Labs: 08/04/2013: Hemoglobin 10.5*; Platelets 171 10/01/2013: BUN 14; Creatinine 1.1; Potassium 3.9; Pro B Natriuretic peptide (BNP) 104.0*; Sodium 138     Wt Readings from Last 3 Encounters:  05/21/14 260 lb (117.935 kg)  04/19/14 268 lb 12.8 oz (121.927 kg)  10/01/13 268 lb (121.564 kg)      Other studies Reviewed: Additional studies/ records that were reviewed today include: previous records and echo, stress test report. Review of the above records demonstrates:  Please see elsewhere in the note.     ASSESSMENT AND PLAN:  SOB:  I think this is multifactorial and related to his obesity and deconditioning and his lung disease. He may have some element of diastolic dysfunction. He does have volume overload by exam. At this point I don't change the meds but have discussed conservative therapy. If this doesn't work he might need higher dose diuretic. We talked about salt and fluid restriction. I gave him instructions on wearing stockings and keeping his feet elevated.  MODERATE AS:  I will repeat this echo in October.  ATRIAL FIB:  I did check a INR today and he'll start getting this checked here. He was given instructions to take 7-1/2 mg of Coumadin for the next 3 days and then go back to his 5 mg per day dose as his INR was slightly low at 1.6. He otherwise tolerates rate control and anticoagulation.  Joel [name] has a CHA2DS2 - VASc score of 4 with a risk of stroke of 4%.  Current medicines are reviewed at length with the patient today.  The patient does not have concerns regarding medicines.  The following changes have been made:  no change  Labs/ tests ordered today include: Echo   Orders Placed This Encounter  Procedures  . EKG 12-Lead  . 2D Echocardiogram without contrast     Disposition:   FU with me in six months.      Signed, Minus Breeding, MD  05/21/2014 5:47 PM    [facility]

## 2014-05-21 NOTE — Patient Instructions (Addendum)
Your physician has requested that you have an echocardiogram. Echocardiography is a painless test that uses sound waves to create images of your heart. It provides your doctor with information about the size and shape of your heart and how well your heart's chambers and valves are working. This procedure takes approximately one hour. There are no restrictions for this procedure. This should be done in October.  Your physician recommends that you schedule a follow-up appointment in October after your echo.   We have given you some information about Elastic Therapy. This should help with your leg swelling along with keeping them elevated.    TAKE 7.5mg  (1.5 tablets) for three days then continue 5mg  (1 tablet) daily. We will need to follow up with a check in 2 weeks.   Low-Sodium Eating Plan Sodium raises blood pressure and causes water to be held in the body. Getting less sodium from food will help lower your blood pressure, reduce any swelling, and protect your heart, liver, and kidneys. We get sodium by adding salt (sodium chloride) to food. Most of our sodium comes from canned, boxed, and frozen foods. Restaurant foods, fast foods, and pizza are also very high in sodium. Even if you take medicine to lower your blood pressure or to reduce fluid in your body, getting less sodium from your food is important. WHAT IS MY PLAN? Most people should limit their sodium intake to 2,300 mg a day. Your health care provider recommends that you limit your sodium intake to __________ a day.  WHAT DO I NEED TO KNOW ABOUT THIS EATING PLAN? For the low-sodium eating plan, you will follow these general guidelines:  Choose foods with a % Daily Value for sodium of less than 5% (as listed on the food label).   Use salt-free seasonings or herbs instead of table salt or sea salt.   Check with your health care provider or pharmacist before using salt substitutes.   Eat fresh foods.  Eat more vegetables and  fruits.  Limit canned vegetables. If you do use them, rinse them well to decrease the sodium.   Limit cheese to 1 oz (28 g) per day.   Eat lower-sodium products, often labeled as "lower sodium" or "no salt added."  Avoid foods that contain monosodium glutamate (MSG). MSG is sometimes added to [place] food and some canned foods.  Check food labels (Nutrition Facts labels) on foods to learn how much sodium is in one serving.  Eat more home-cooked food and less restaurant, buffet, and fast food.  When eating at a restaurant, ask that your food be prepared with less salt or none, if possible.  HOW DO I READ FOOD LABELS FOR SODIUM INFORMATION? The Nutrition Facts label lists the amount of sodium in one serving of the food. If you eat more than one serving, you must multiply the listed amount of sodium by the number of servings. Food labels may also identify foods as:  Sodium free--Less than 5 mg in a serving.  Very low sodium--35 mg or less in a serving.  Low sodium--140 mg or less in a serving.  Light in sodium--50% less sodium in a serving. For example, if a food that usually has 300 mg of sodium is changed to become light in sodium, it will have 150 mg of sodium.  Reduced sodium--25% less sodium in a serving. For example, if a food that usually has 400 mg of sodium is changed to reduced sodium, it will have 300 mg of sodium. WHAT  FOODS CAN I EAT? Grains Low-sodium cereals, including oats, puffed wheat and rice, and shredded wheat cereals. Low-sodium crackers. Unsalted rice and pasta. Lower-sodium bread.  Vegetables Frozen or fresh vegetables. Low-sodium or reduced-sodium canned vegetables. Low-sodium or reduced-sodium tomato sauce and paste. Low-sodium or reduced-sodium tomato and vegetable juices.  Fruits Fresh, frozen, and canned fruit. Fruit juice.  Meat and Other Protein Products Low-sodium canned tuna and salmon. Fresh or frozen meat, poultry, seafood, and fish.  Lamb. Unsalted nuts. Dried beans, peas, and lentils without added salt. Unsalted canned beans. Homemade soups without salt. Eggs.  Dairy Milk. Soy milk. Ricotta cheese. Low-sodium or reduced-sodium cheeses. Yogurt.  Condiments Fresh and dried herbs and spices. Salt-free seasonings. Onion and garlic powders. Low-sodium varieties of mustard and ketchup. Lemon juice.  Fats and Oils Reduced-sodium salad dressings. Unsalted butter.  Other Unsalted popcorn and pretzels.  The items listed above may not be a complete list of recommended foods or beverages. Contact your dietitian for more options. WHAT FOODS ARE NOT RECOMMENDED? Grains Instant hot cereals. Bread stuffing, pancake, and biscuit mixes. Croutons. Seasoned rice or pasta mixes. Noodle soup cups. Boxed or frozen macaroni and cheese. Self-rising flour. Regular salted crackers. Vegetables Regular canned vegetables. Regular canned tomato sauce and paste. Regular tomato and vegetable juices. Frozen vegetables in sauces. Salted french fries. Olives. Angie Fava. Relishes. Sauerkraut. Salsa. Meat and Other Protein Products Salted, canned, smoked, spiced, or pickled meats, seafood, or fish. Bacon, ham, sausage, hot dogs, corned beef, chipped beef, and packaged luncheon meats. Salt pork. Jerky. Pickled herring. Anchovies, regular canned tuna, and sardines. Salted nuts. Dairy Processed cheese and cheese spreads. Cheese curds. Blue cheese and cottage cheese. Buttermilk.  Condiments Onion and garlic salt, seasoned salt, table salt, and sea salt. Canned and packaged gravies. Worcestershire sauce. Tartar sauce. Barbecue sauce. Teriyaki sauce. Soy sauce, including reduced sodium. Steak sauce. Fish sauce. Oyster sauce. Cocktail sauce. Horseradish. Regular ketchup and mustard. Meat flavorings and tenderizers. Bouillon cubes. Hot sauce. Tabasco sauce. Marinades. Taco seasonings. Relishes. Fats and Oils Regular salad dressings. Salted butter.  Margarine. Ghee. Bacon fat.  Other Potato and tortilla chips. Corn chips and puffs. Salted popcorn and pretzels. Canned or dried soups. Pizza. Frozen entrees and pot pies.  The items listed above may not be a complete list of foods and beverages to avoid. Contact your dietitian for more information. Document Released: 07/10/2001 Document Revised: 01/23/2013 Document Reviewed: 11/22/2012 [facility] Patient Information 2015 [place], [place]. This information is not intended to replace advice given to you by your health care provider. Make sure you discuss any questions you have with your health care provider.

## 2014-05-23 ENCOUNTER — Telehealth: Payer: Self-pay | Admitting: Cardiology

## 2014-05-23 NOTE — Telephone Encounter (Signed)
Says she need to give you the dose of Metformin he takes.

## 2014-05-23 NOTE — Telephone Encounter (Signed)
Metformin dose updated on med list based on patient report.

## 2014-05-27 ENCOUNTER — Telehealth (HOSPITAL_COMMUNITY): Payer: Self-pay | Admitting: *Deleted

## 2014-05-31 ENCOUNTER — Other Ambulatory Visit: Payer: Self-pay | Admitting: Cardiovascular Disease

## 2014-06-05 ENCOUNTER — Ambulatory Visit (INDEPENDENT_AMBULATORY_CARE_PROVIDER_SITE_OTHER): Payer: PPO | Admitting: Pharmacist Clinician (PhC)/ Clinical Pharmacy Specialist

## 2014-06-05 DIAGNOSIS — I482 Chronic atrial fibrillation, unspecified: Secondary | ICD-10-CM

## 2014-06-05 DIAGNOSIS — Z7901 Long term (current) use of anticoagulants: Secondary | ICD-10-CM | POA: Diagnosis not present

## 2014-06-05 LAB — POCT INR: INR: 1.9

## 2014-06-06 ENCOUNTER — Other Ambulatory Visit (HOSPITAL_COMMUNITY): Payer: PPO

## 2014-07-03 ENCOUNTER — Ambulatory Visit: Payer: PPO | Admitting: Pharmacist Clinician (PhC)/ Clinical Pharmacy Specialist

## 2014-07-04 ENCOUNTER — Ambulatory Visit: Payer: PPO | Admitting: Pharmacist Clinician (PhC)/ Clinical Pharmacy Specialist

## 2014-07-05 ENCOUNTER — Ambulatory Visit: Payer: PPO | Admitting: Pharmacist Clinician (PhC)/ Clinical Pharmacy Specialist

## 2014-07-09 ENCOUNTER — Other Ambulatory Visit (HOSPITAL_COMMUNITY): Payer: Self-pay | Admitting: *Deleted

## 2014-07-10 ENCOUNTER — Encounter (HOSPITAL_COMMUNITY)
Admission: RE | Admit: 2014-07-10 | Discharge: 2014-07-10 | Disposition: A | Discharge status: Home / Self Care | Payer: PPO | Source: Ambulatory Visit | Attending: Internal Medicine | Admitting: Internal Medicine

## 2014-07-10 DIAGNOSIS — D509 Iron deficiency anemia, unspecified: Secondary | ICD-10-CM | POA: Diagnosis not present

## 2014-07-10 MED ORDER — IRON DEXTRAN 50 MG/ML IJ SOLN
1000.0000 mg | Freq: Once | INTRAMUSCULAR | Status: AC
  Administered 2014-07-10: 1000 mg via INTRAVENOUS
  Filled 2014-07-10: qty 20

## 2014-07-10 MED ORDER — SODIUM CHLORIDE 0.9 % IV SOLN
25.0000 mg | Freq: Once | INTRAVENOUS | Status: AC
  Administered 2014-07-10: 25 mg via INTRAVENOUS
  Filled 2014-07-10: qty 0.5

## 2014-07-10 MED ORDER — ACETAMINOPHEN 500 MG PO TABS: 500.0000 mg | ORAL_TABLET | Freq: Four times a day (QID) | ORAL | Status: DC | PRN

## 2014-07-29 ENCOUNTER — Other Ambulatory Visit: Payer: Self-pay

## 2014-08-19 ENCOUNTER — Ambulatory Visit: Payer: PPO | Admitting: Internal Medicine

## 2014-10-22 ENCOUNTER — Observation Stay (HOSPITAL_COMMUNITY)
Admission: EM | Admit: 2014-10-22 | Discharge: 2014-10-23 | Disposition: A | Discharge status: Home / Self Care | Payer: PPO | Attending: Internal Medicine | Admitting: Internal Medicine

## 2014-10-22 ENCOUNTER — Other Ambulatory Visit: Payer: Self-pay | Admitting: Internal Medicine

## 2014-10-22 ENCOUNTER — Emergency Department (HOSPITAL_COMMUNITY): Payer: PPO

## 2014-10-22 ENCOUNTER — Encounter (HOSPITAL_COMMUNITY): Payer: Self-pay | Admitting: *Deleted

## 2014-10-22 ENCOUNTER — Ambulatory Visit
Admission: RE | Admit: 2014-10-22 | Discharge: 2014-10-22 | Disposition: A | Discharge status: Home / Self Care | Payer: PPO | Source: Ambulatory Visit | Attending: Internal Medicine | Admitting: Internal Medicine

## 2014-10-22 DIAGNOSIS — E669 Obesity, unspecified: Secondary | ICD-10-CM | POA: Insufficient documentation

## 2014-10-22 DIAGNOSIS — G4733 Obstructive sleep apnea (adult) (pediatric): Secondary | ICD-10-CM | POA: Insufficient documentation

## 2014-10-22 DIAGNOSIS — R0602 Shortness of breath: Secondary | ICD-10-CM

## 2014-10-22 DIAGNOSIS — E119 Type 2 diabetes mellitus without complications: Secondary | ICD-10-CM

## 2014-10-22 DIAGNOSIS — I4891 Unspecified atrial fibrillation: Secondary | ICD-10-CM | POA: Diagnosis not present

## 2014-10-22 DIAGNOSIS — I350 Nonrheumatic aortic (valve) stenosis: Secondary | ICD-10-CM | POA: Diagnosis not present

## 2014-10-22 DIAGNOSIS — Z9981 Dependence on supplemental oxygen: Secondary | ICD-10-CM | POA: Insufficient documentation

## 2014-10-22 DIAGNOSIS — E1140 Type 2 diabetes mellitus with diabetic neuropathy, unspecified: Secondary | ICD-10-CM | POA: Insufficient documentation

## 2014-10-22 DIAGNOSIS — I482 Chronic atrial fibrillation, unspecified: Secondary | ICD-10-CM | POA: Diagnosis present

## 2014-10-22 DIAGNOSIS — F419 Anxiety disorder, unspecified: Secondary | ICD-10-CM | POA: Insufficient documentation

## 2014-10-22 DIAGNOSIS — Z7901 Long term (current) use of anticoagulants: Secondary | ICD-10-CM | POA: Diagnosis not present

## 2014-10-22 DIAGNOSIS — R5383 Other fatigue: Secondary | ICD-10-CM | POA: Diagnosis present

## 2014-10-22 DIAGNOSIS — M109 Gout, unspecified: Secondary | ICD-10-CM | POA: Insufficient documentation

## 2014-10-22 DIAGNOSIS — Z87891 Personal history of nicotine dependence: Secondary | ICD-10-CM | POA: Diagnosis not present

## 2014-10-22 DIAGNOSIS — I10 Essential (primary) hypertension: Secondary | ICD-10-CM | POA: Insufficient documentation

## 2014-10-22 DIAGNOSIS — Z6833 Body mass index (BMI) 33.0-33.9, adult: Secondary | ICD-10-CM | POA: Diagnosis not present

## 2014-10-22 DIAGNOSIS — D509 Iron deficiency anemia, unspecified: Secondary | ICD-10-CM | POA: Diagnosis not present

## 2014-10-22 DIAGNOSIS — I517 Cardiomegaly: Secondary | ICD-10-CM | POA: Diagnosis not present

## 2014-10-22 DIAGNOSIS — I2510 Atherosclerotic heart disease of native coronary artery without angina pectoris: Secondary | ICD-10-CM | POA: Diagnosis not present

## 2014-10-22 DIAGNOSIS — D649 Anemia, unspecified: Secondary | ICD-10-CM | POA: Diagnosis present

## 2014-10-22 DIAGNOSIS — E785 Hyperlipidemia, unspecified: Secondary | ICD-10-CM | POA: Diagnosis not present

## 2014-10-22 DIAGNOSIS — J449 Chronic obstructive pulmonary disease, unspecified: Secondary | ICD-10-CM | POA: Diagnosis not present

## 2014-10-22 DIAGNOSIS — Z7951 Long term (current) use of inhaled steroids: Secondary | ICD-10-CM | POA: Insufficient documentation

## 2014-10-22 DIAGNOSIS — Z79899 Other long term (current) drug therapy: Secondary | ICD-10-CM | POA: Insufficient documentation

## 2014-10-22 LAB — URINALYSIS, ROUTINE W REFLEX MICROSCOPIC
Bilirubin Urine: NEGATIVE
GLUCOSE, UA: NEGATIVE mg/dL
HGB URINE DIPSTICK: NEGATIVE
Ketones, ur: NEGATIVE mg/dL
Leukocytes, UA: NEGATIVE
Nitrite: NEGATIVE
PH: 5.5 (ref 5.0–8.0)
PROTEIN: NEGATIVE mg/dL
Specific Gravity, Urine: 1.023 (ref 1.005–1.030)
Urobilinogen, UA: 0.2 mg/dL (ref 0.0–1.0)

## 2014-10-22 LAB — BASIC METABOLIC PANEL
ANION GAP: 7 (ref 5–15)
BUN: 17 mg/dL (ref 6–20)
CALCIUM: 9 mg/dL (ref 8.9–10.3)
CHLORIDE: 105 mmol/L (ref 101–111)
CO2: 27 mmol/L (ref 22–32)
CREATININE: 1.27 mg/dL — AB (ref 0.61–1.24)
GFR calc Af Amer: >60 mL/min (ref >60)
GFR calc non Af Amer: 52 mL/min — ABNORMAL LOW (ref >60)
GLUCOSE: 174 mg/dL — AB (ref 65–99)
Potassium: 4.3 mmol/L (ref 3.5–5.1)
Sodium: 139 mmol/L (ref 135–145)

## 2014-10-22 LAB — CBC
HCT: 26.1 % — ABNORMAL LOW (ref 39.0–52.0)
HEMOGLOBIN: 7.4 g/dL — AB (ref 13.0–17.0)
MCH: 21.7 pg — AB (ref 26.0–34.0)
MCHC: 28.4 g/dL — AB (ref 30.0–36.0)
MCV: 76.5 fL — AB (ref 78.0–100.0)
Platelets: 221 10*3/uL (ref 150–400)
RBC: 3.41 MIL/uL — ABNORMAL LOW (ref 4.22–5.81)
RDW: 17.4 % — ABNORMAL HIGH (ref 11.5–15.5)
WBC: 5.1 10*3/uL (ref 4.0–10.5)

## 2014-10-22 LAB — PROTIME-INR
INR: 1.73 — AB (ref 0.00–1.49)
PROTHROMBIN TIME: 20.2 s — AB (ref 11.6–15.2)

## 2014-10-22 LAB — POC OCCULT BLOOD, ED: Fecal Occult Bld: NEGATIVE

## 2014-10-22 LAB — GLUCOSE, CAPILLARY: Glucose-Capillary: 179 mg/dL — ABNORMAL HIGH (ref 65–99)

## 2014-10-22 LAB — PREPARE RBC (CROSSMATCH)

## 2014-10-22 LAB — ABO/RH: ABO/RH(D): O POS

## 2014-10-22 MED ORDER — TEMAZEPAM 15 MG PO CAPS
15.0000 mg | ORAL_CAPSULE | Freq: Every day | ORAL | Status: DC
  Administered 2014-10-22: 15 mg via ORAL
  Filled 2014-10-22: qty 1

## 2014-10-22 MED ORDER — PRAVASTATIN SODIUM 20 MG PO TABS
20.0000 mg | ORAL_TABLET | Freq: Every day | ORAL | Status: DC
  Administered 2014-10-23: 20 mg via ORAL
  Filled 2014-10-22: qty 1

## 2014-10-22 MED ORDER — FLUTICASONE PROPIONATE 50 MCG/ACT NA SUSP
1.0000 | Freq: Two times a day (BID) | NASAL | Status: DC
  Administered 2014-10-23: 1 via NASAL
  Filled 2014-10-22: qty 16

## 2014-10-22 MED ORDER — DILTIAZEM HCL ER 120 MG PO CP24
120.0000 mg | ORAL_CAPSULE | Freq: Every day | ORAL | Status: DC
  Filled 2014-10-22: qty 1

## 2014-10-22 MED ORDER — POTASSIUM CHLORIDE CRYS ER 20 MEQ PO TBCR
20.0000 meq | EXTENDED_RELEASE_TABLET | Freq: Every day | ORAL | Status: DC
  Administered 2014-10-22 – 2014-10-23 (×2): 20 meq via ORAL
  Filled 2014-10-22 (×2): qty 1

## 2014-10-22 MED ORDER — SODIUM CHLORIDE 0.9 % IV SOLN
Freq: Once | INTRAVENOUS | Status: AC
  Administered 2014-10-23: 12:00:00 via INTRAVENOUS

## 2014-10-22 MED ORDER — FUROSEMIDE 40 MG PO TABS
40.0000 mg | ORAL_TABLET | Freq: Every day | ORAL | Status: DC
  Administered 2014-10-23: 40 mg via ORAL
  Filled 2014-10-22 (×2): qty 1

## 2014-10-22 MED ORDER — ARFORMOTEROL TARTRATE 15 MCG/2ML IN NEBU
15.0000 ug | INHALATION_SOLUTION | Freq: Two times a day (BID) | RESPIRATORY_TRACT | Status: DC
  Administered 2014-10-23: 15 ug via RESPIRATORY_TRACT
  Filled 2014-10-22 (×4): qty 2

## 2014-10-22 MED ORDER — IRBESARTAN 150 MG PO TABS
150.0000 mg | ORAL_TABLET | Freq: Every day | ORAL | Status: DC
  Administered 2014-10-23: 150 mg via ORAL
  Filled 2014-10-22: qty 1

## 2014-10-22 MED ORDER — TIOTROPIUM BROMIDE-OLODATEROL 2.5-2.5 MCG/ACT IN AERS: 2.0000 | INHALATION_SPRAY | Freq: Every day | RESPIRATORY_TRACT | Status: DC

## 2014-10-22 MED ORDER — WARFARIN SODIUM 7.5 MG PO TABS
7.5000 mg | ORAL_TABLET | Freq: Once | ORAL | Status: AC
  Administered 2014-10-22: 7.5 mg via ORAL
  Filled 2014-10-22: qty 1

## 2014-10-22 MED ORDER — WARFARIN - PHARMACIST DOSING INPATIENT: Freq: Every day | Status: DC

## 2014-10-22 MED ORDER — CITALOPRAM HYDROBROMIDE 40 MG PO TABS
40.0000 mg | ORAL_TABLET | Freq: Every morning | ORAL | Status: DC
Start: 2014-10-23 — End: 2014-10-23
  Administered 2014-10-23: 40 mg via ORAL
  Filled 2014-10-22: qty 1

## 2014-10-22 MED ORDER — ALLOPURINOL 100 MG PO TABS
100.0000 mg | ORAL_TABLET | Freq: Every day | ORAL | Status: DC
  Administered 2014-10-23: 100 mg via ORAL
  Filled 2014-10-22: qty 1

## 2014-10-22 MED ORDER — TIOTROPIUM BROMIDE MONOHYDRATE 18 MCG IN CAPS
18.0000 ug | ORAL_CAPSULE | Freq: Every day | RESPIRATORY_TRACT | Status: DC
  Administered 2014-10-23: 18 ug via RESPIRATORY_TRACT
  Filled 2014-10-22: qty 5

## 2014-10-22 MED ORDER — METFORMIN HCL 500 MG PO TABS
1000.0000 mg | ORAL_TABLET | Freq: Two times a day (BID) | ORAL | Status: DC
Start: 2014-10-23 — End: 2014-10-23
  Administered 2014-10-23 (×2): 1000 mg via ORAL
  Filled 2014-10-22 (×3): qty 2

## 2014-10-22 NOTE — ED Notes (Signed)
One unsuccessful iv attempt by this Probation officer.

## 2014-10-22 NOTE — ED Notes (Addendum)
Patient went to see his primary care physician this afternoon due to increased fatigue and shortness of breath. Patient states that after he arrived back home he got a call from his doctor's office stating that his hgb is 7. Patient states that he had to have iron infusions 2 months ago. Patient's caregiver brought him over to emergency per doctor recommendation. Patient also endorses soreness/pressure over his sternum.

## 2014-10-22 NOTE — Progress Notes (Signed)
ANTICOAGULATION CONSULT NOTE - Initial Consult  Pharmacy Consult for Warfarin Indication: atrial fibrillation  Allergies  Allergen Reactions  . Antihistamines, Loratadine-Type     depression  . Meclizine     depression  . Other     Beta blockers-Novacaine depression    Patient Measurements:    Vital Signs: Temp: 97.5 F (36.4 C) (09/20 1833) Temp Source: Oral (09/20 1833) BP: 142/97 mmHg (09/20 2137) Pulse Rate: 72 (09/20 2137)  Labs:  Recent Labs  10/22/14 1902 10/22/14 2051  HGB 7.4*  --   HCT 26.1*  --   PLT 221  --   LABPROT  --  20.2*  INR  --  1.73*  CREATININE 1.27*  --     CrCl cannot be calculated (Unknown ideal weight.).   Medical History: Past Medical History  Diagnosis Date  . COPD (chronic obstructive pulmonary disease)   . Atrial fibrillation 1991    with multiple DCCV  . Type 2 diabetes mellitus   . Bipolar affective disorder   . Vertigo   . Anemia   . Gout   . Fatigue     last year or so  . OSA (obstructive sleep apnea)     severe, uses bipap 16 1/2 by 12 or 13 setting  . On home oxygen therapy 2 1/2 liters with bipap at night  . Hypertension   . Dyslipidemia   . Anxiety   . Insomnia   . Allergic rhinitis   . Obesity   . Diabetic neuropathy     in feet  . Aortic stenosis 2012    mild to mod     Medications:  Scheduled:  . [START ON 10/23/2014] allopurinol  100 mg Oral Daily  . [START ON 10/23/2014] citalopram  40 mg Oral q morning - 10a  . [START ON 10/23/2014] diltiazem  120 mg Oral QHS  . fluticasone  1 spray Each Nare BID  . furosemide  40 mg Oral Daily  . [START ON 10/23/2014] irbesartan  150 mg Oral Daily  . [START ON 10/23/2014] metFORMIN  1,000 mg Oral BID WC  . potassium chloride SA  20 mEq Oral Daily  . [START ON 10/23/2014] pravastatin  20 mg Oral q1800  . temazepam  15 mg Oral QHS  . Tiotropium Bromide-Olodaterol  2 puff Inhalation Daily   Infusions:  . sodium chloride      Assessment:  81 yr male on  warfarin PTA for h/o AFib, and h/o chronic iron deficiency anemia  PTA warfarin regimen = 5mg  daily with last dose taken on 9/19 @ 21:00  INR = 1.73 (9/20)  Patient admitted for anemia  Pharmacy consulted to continue dosing of warfarin upon admission  INR subtherapeutic on home regimen  Goal of Therapy:  INR 2-3   Plan:   Warfarin 7.5mg  po x 1 tonight (1.5 x home dose due to subtherapeutic INR)  Check daily PT/INR  Poindexter, Toribio Harbour, PharmD 10/22/2014,10:08 PM

## 2014-10-22 NOTE — ED Provider Notes (Signed)
The patient is a 81 year old male who has a history of anemia, has had iron transfusions in the past, reports recent dark stools, lightheadedness and significant dyspnea on exertion and is now anemic according to blood work at his office today. Sent here for further evaluation admission and transfusion. On exam the patient is obese, he is in no distress, his lungs are clear except for a mild wheeze occasionally, speaks in full sentences, no significant peripheral edema, no abdominal tenderness. He appears slightly pale and the conjunctiva, his labs are reviewed and show anemia requiring transfusion.  Medical screening examination/treatment/procedure(s) were conducted as a shared visit with non-physician practitioner(s) and myself.  I personally evaluated the patient during the encounter.  Clinical Impression:   Final diagnoses:  Anemia, unspecified anemia type         Noemi Chapel, MD 10/25/14 [phone]

## 2014-10-22 NOTE — ED Provider Notes (Signed)
CSN: [id number]     Arrival date & time 10/22/14  1802 History   First MD Initiated Contact with Patient 10/22/14 1922     Chief Complaint  Patient presents with  . Abnormal Lab  . Shortness of Breath     (Consider location/radiation/quality/duration/timing/severity/associated sxs/prior Treatment) HPI Joel [name] is a 81 y.o. male who comes in for evaluation of fatigue and shortness of breath. Patient states he has had increased fatigue and shortness of breath with minimal exertion over the past week. He reports being seen by his PCP yesterday and was called today about a hemoglobin of 7 and to come to the ED for reevaluation. Patient reports he had to have "to iron infusions 2 months ago for anemia". He reports this has been an ongoing problem for years, but he reports he quit taking his iron supplements because it constipated him. He reports intermittent black stools without any overt bloody stool. Reports central chest pressure onset earlier this afternoon. Son at bedside reports patient was more active today than usual. Patient reports deep respiration will exacerbate his chest discomfort. Denies any headache, vision changes, diaphoresis, nausea or vomiting, urinary symptoms, numbness. No other modifying factors.  Past Medical History  Diagnosis Date  . COPD (chronic obstructive pulmonary disease)   . Atrial fibrillation 1991    with multiple DCCV  . Type 2 diabetes mellitus   . Bipolar affective disorder   . Vertigo   . Anemia   . Gout   . Fatigue     last year or so  . OSA (obstructive sleep apnea)     severe, uses bipap 16 1/2 by 12 or 13 setting  . On home oxygen therapy 2 1/2 liters with bipap at night  . Hypertension   . Dyslipidemia   . Anxiety   . Insomnia   . Allergic rhinitis   . Obesity   . Diabetic neuropathy     in feet  . Aortic stenosis 2012    mild to mod    Past Surgical History  Procedure Laterality Date  . Maze  1998  . Knee surgery  1970's    rt   . Shoulder surgery  7-8 yrs ago    rt  . Cardioversion  yrs ago  . Electro shock  1969    for depression  . Colonoscopy with propofol N/A 01/23/2013    Procedure: COLONOSCOPY WITH PROPOFOL;  Surgeon: Garlan Fair, MD;  Location: [facility];  Service: Endoscopy;  Laterality: N/A;  . Esophagogastroduodenoscopy (egd) with propofol N/A 01/23/2013    Procedure: ESOPHAGOGASTRODUODENOSCOPY (EGD) WITH PROPOFOL;  Surgeon: Garlan Fair, MD;  Location: [facility];  Service: Endoscopy;  Laterality: N/A;   Family History  Problem Relation Age of Onset  . Tuberculosis Maternal Grandmother   . Heart attack Neg Hx    Social History  Substance Use Topics  . Smoking status: Former Smoker -- 2.00 packs/day for 25 years    Types: Cigarettes    Quit date: 07/08/1976  . Smokeless tobacco: Never Used  . Alcohol Use: No     Comment: in AA since 1977, no alcohol since 1977    Review of Systems A 10 point review of systems was completed and was negative except for pertinent positives and negatives as mentioned in the history of present illness     Allergies  Antihistamines, loratadine-type; Meclizine; and Other  Home Medications   Prior to Admission medications   Medication Sig Start Date End Date  Taking? Authorizing Provider  allopurinol (ZYLOPRIM) 100 MG tablet Take 100 mg by mouth daily.    Yes Historical Provider, MD  citalopram (CELEXA) 40 MG tablet Take 40 mg by mouth every morning.   Yes Historical Provider, MD  diltiazem (DILACOR XR) 120 MG 24 hr capsule Take 120 mg by mouth at bedtime.   Yes Historical Provider, MD  fluticasone (FLONASE) 50 MCG/ACT nasal spray Place 1 spray into both nostrils 2 (two) times daily.   Yes Historical Provider, MD  furosemide (LASIX) 40 MG tablet Take 40 mg by mouth daily.   Yes Historical Provider, MD  lovastatin (MEVACOR) 20 MG tablet Take 20 mg by mouth every morning.    Yes Historical Provider, MD  metFORMIN (GLUCOPHAGE) 1000 MG tablet Take  1,000 mg by mouth 2 (two) times daily.  05/17/14  Yes Historical Provider, MD  OVER THE COUNTER MEDICATION Place 1 drop into both eyes 3 (three) times daily. For dry eyes   Yes Historical Provider, MD  potassium chloride SA (K-DUR,KLOR-CON) 20 MEQ tablet Take 1 tablet by mouth daily. 09/10/14  Yes Historical Provider, MD  temazepam (RESTORIL) 15 MG capsule Take 1 capsule by mouth at bedtime.  04/07/14  Yes Historical Provider, MD  Tiotropium Bromide-Olodaterol (STIOLTO RESPIMAT) 2.5-2.5 MCG/ACT AERS Inhale 2 puffs into the lungs daily. 04/19/14  Yes Deneise Lever, MD  valsartan (DIOVAN) 160 MG tablet Take 160 mg by mouth every morning.    Yes Historical Provider, MD  warfarin (COUMADIN) 5 MG tablet Take 5 mg by mouth every evening.    Yes Historical Provider, MD   BP 109/56 mmHg  Pulse 79  Temp(Src) 97.5 F (36.4 C) (Oral)  Resp 23  SpO2 97% Physical Exam  Constitutional: He is oriented to person, place, and time. He appears well-developed and well-nourished.  HENT:  Head: Normocephalic and atraumatic.  Mouth/Throat: Oropharynx is clear and moist.  Eyes: Conjunctivae are normal. Pupils are equal, round, and reactive to light. Right eye exhibits no discharge. Left eye exhibits no discharge. No scleral icterus.  Neck: Neck supple.  Cardiovascular: Normal rate, regular rhythm and normal heart sounds.   Pulmonary/Chest: Effort normal and breath sounds normal. No respiratory distress. He has no wheezes. He has no rales.  Abdominal: Soft. There is no tenderness.  Genitourinary:  Chaperone present for entirety of rectal exam. Soft brown stool on exam glove, no overt or frank blood. No hemorrhoids or fissures.  Musculoskeletal: Normal range of motion. He exhibits no edema or tenderness.  Neurological: He is alert and oriented to person, place, and time.  Cranial Nerves II-XII grossly intact  Skin: Skin is warm and dry. No rash noted.  Psychiatric: He has a normal mood and affect.  Nursing note and  vitals reviewed.   ED Course  Procedures (including critical care time) Labs Review Labs Reviewed  BASIC METABOLIC PANEL - Abnormal; Notable for the following:    Glucose, Bld 174 (*)    Creatinine, Ser 1.27 (*)    GFR calc non Af Amer 52 (*)    All other components within normal limits  CBC - Abnormal; Notable for the following:    RBC 3.41 (*)    Hemoglobin 7.4 (*)    HCT 26.1 (*)    MCV 76.5 (*)    MCH 21.7 (*)    MCHC 28.4 (*)    RDW 17.4 (*)    All other components within normal limits  URINALYSIS, ROUTINE W REFLEX MICROSCOPIC (NOT AT [facility])  PROTIME-INR  POC OCCULT BLOOD, ED  CBG MONITORING, ED  TYPE AND SCREEN    Imaging Review Dg Chest 2 View  10/22/2014   CLINICAL DATA:  Shortness of breath for several days. History of atrial fibrillation.  EXAM: CHEST  2 VIEW  COMPARISON:  04/19/2014.  FINDINGS: Stable enlarged cardiac silhouette and median sternotomy wires. Poor inspiration with mild bibasilar atelectasis and scarring. The pulmonary vasculature is mildly prominent. No pleural fluid. Thoracic spine degenerative changes.  IMPRESSION: 1. Stable cardiomegaly with interval mild pulmonary vascular congestion. 2. Mild bibasilar atelectasis and scarring.   Electronically Signed   By: Claudie Revering M.D.   On: 10/22/2014 15:13   I have personally reviewed and evaluated these images and lab results as part of my medical decision-making.   EKG Interpretation   Date/Time:  Tuesday October 22 2014 18:48:12 EDT Ventricular Rate:  70 PR Interval:    QRS Duration: 103 QT Interval:  430 QTC Calculation: 464 R Axis:   69 Text Interpretation:  Atrial fibrillation Probable anteroseptal infarct,  old Minimal ST depression, anterolateral leads Since last tracing rate  slower Confirmed by MILLER  MD, BRIAN ([id number]) on 10/22/2014 7:28:40 PM     Meds given in ED:  Medications  0.9 %  sodium chloride infusion (not administered)    New Prescriptions   No medications on file    Filed Vitals:   10/22/14 1833  BP: 109/56  Pulse: 79  Temp: 97.5 F (36.4 C)  TempSrc: Oral  Resp: 23  SpO2: 97%   CRITICAL CARE Performed by: Verl Dicker   Total critical care time: 35  Critical care time was exclusive of separately billable procedures and treating other patients.  Critical care was necessary to treat or prevent imminent or life-threatening deterioration.  Critical care was time spent personally by me on the following activities: development of treatment plan with patient and/or surrogate as well as nursing, discussions with consultants, evaluation of patient's response to treatment, examination of patient, obtaining history from patient or surrogate, ordering and performing treatments and interventions, ordering and review of laboratory studies, ordering and review of radiographic studies, pulse oximetry and re-evaluation of patient's condition.  MDM  Patient here with a history of COPD, CAD, diabetes, comes in for evaluation of fatigue and shortness of breath. Patient is referred here by his PCP for recent hemoglobin of 7. Patient had been receiving iron infusions in the past for his anemia, but discontinued his home iron supplement medication himself. Reports He has not taken in over a year due to side effect profile. On arrival, patient is hemodynamically stable, however reports he has been increasingly short of breath over the past week. Patient does have a pale appearance. No evidence of fluid overload. EKG shows A. fib which is known. Chest x-ray shows stable cardiomegaly with mild interstitial vascular congestion. Labs are significant for a hemoglobin of 7.4, negative fecal occult, INR 1.73. Patient will be transfused 2 units PRBC in the ED. Will need admission for further evaluation of anemia. Patient admitted Prior to patient admission, I discussed and reviewed this case with my attending, Dr. Sabra Heck who also saw and evaluated the patient and  agrees with plan for admission. Final diagnoses:  Anemia, unspecified anemia type        Comer Locket, PA-C 10/22/14 [id number]  Noemi Chapel, MD 10/25/14 [phone]

## 2014-10-22 NOTE — H&P (Signed)
[facility] History and Physical  RAYHAN GROLEAU AOZ:[id number] DOB: 1933-01-25 DOA: 10/22/2014  Referring physician: EDP PCP: Wenda Low, MD   Chief Complaint: Fatigue   HPI: Joel [name] is a 81 y.o. male with h/o chronic iron deficiency anemia, stopped taking iron due to constipation.  Patient has increasing fatigue and SOB with minimal exertion over the past 2 weeks.  Seen by PCP yesterday and noted to have HGB of 7 and told to come to ED.  Does report intermittent black stools most recently over the past 2 days without any overt bloody stool.  Review of Systems: Systems reviewed.  As above, otherwise negative  Past Medical History  Diagnosis Date  . COPD (chronic obstructive pulmonary disease)   . Atrial fibrillation 1991    with multiple DCCV  . Type 2 diabetes mellitus   . Bipolar affective disorder   . Vertigo   . Anemia   . Gout   . Fatigue     last year or so  . OSA (obstructive sleep apnea)     severe, uses bipap 16 1/2 by 12 or 13 setting  . On home oxygen therapy 2 1/2 liters with bipap at night  . Hypertension   . Dyslipidemia   . Anxiety   . Insomnia   . Allergic rhinitis   . Obesity   . Diabetic neuropathy     in feet  . Aortic stenosis 2012    mild to mod    Past Surgical History  Procedure Laterality Date  . Maze  1998  . Knee surgery  1970's    rt  . Shoulder surgery  7-8 yrs ago    rt  . Cardioversion  yrs ago  . Electro shock  1969    for depression  . Colonoscopy with propofol N/A 01/23/2013    Procedure: COLONOSCOPY WITH PROPOFOL;  Surgeon: Garlan Fair, MD;  Location: [facility];  Service: Endoscopy;  Laterality: N/A;  . Esophagogastroduodenoscopy (egd) with propofol N/A 01/23/2013    Procedure: ESOPHAGOGASTRODUODENOSCOPY (EGD) WITH PROPOFOL;  Surgeon: Garlan Fair, MD;  Location: [facility];  Service: Endoscopy;  Laterality: N/A;   Social History:  reports that he quit smoking about 38 years ago. His smoking use  included Cigarettes. He has a 50 pack-year smoking history. He has never used smokeless tobacco. He reports that he does not drink alcohol or use illicit drugs.  Allergies  Allergen Reactions  . Antihistamines, Loratadine-Type     depression  . Meclizine     depression  . Other     Beta blockers-Novacaine depression    Family History  Problem Relation Age of Onset  . Tuberculosis Maternal Grandmother   . Heart attack Neg Hx      Prior to Admission medications   Medication Sig Start Date End Date Taking? Authorizing Provider  allopurinol (ZYLOPRIM) 100 MG tablet Take 100 mg by mouth daily.    Yes Historical Provider, MD  citalopram (CELEXA) 40 MG tablet Take 40 mg by mouth every morning.   Yes Historical Provider, MD  diltiazem (DILACOR XR) 120 MG 24 hr capsule Take 120 mg by mouth at bedtime.   Yes Historical Provider, MD  fluticasone (FLONASE) 50 MCG/ACT nasal spray Place 1 spray into both nostrils 2 (two) times daily.   Yes Historical Provider, MD  furosemide (LASIX) 40 MG tablet Take 40 mg by mouth daily.   Yes Historical Provider, MD  lovastatin (MEVACOR) 20 MG tablet Take 20 mg by mouth  every morning.    Yes Historical Provider, MD  metFORMIN (GLUCOPHAGE) 1000 MG tablet Take 1,000 mg by mouth 2 (two) times daily.  05/17/14  Yes Historical Provider, MD  OVER THE COUNTER MEDICATION Place 1 drop into both eyes 3 (three) times daily. For dry eyes   Yes Historical Provider, MD  potassium chloride SA (K-DUR,KLOR-CON) 20 MEQ tablet Take 1 tablet by mouth daily. 09/10/14  Yes Historical Provider, MD  temazepam (RESTORIL) 15 MG capsule Take 1 capsule by mouth at bedtime.  04/07/14  Yes Historical Provider, MD  Tiotropium Bromide-Olodaterol (STIOLTO RESPIMAT) 2.5-2.5 MCG/ACT AERS Inhale 2 puffs into the lungs daily. 04/19/14  Yes Deneise Lever, MD  valsartan (DIOVAN) 160 MG tablet Take 160 mg by mouth every morning.    Yes Historical Provider, MD  warfarin (COUMADIN) 5 MG tablet Take 5 mg by  mouth every evening.    Yes Historical Provider, MD   Physical Exam: Filed Vitals:   10/22/14 2137  BP: 142/97  Pulse: 72  Temp:   Resp: 21    BP 142/97 mmHg  Pulse 72  Temp(Src) 97.5 F (36.4 C) (Oral)  Resp 21  SpO2 96%  General Appearance:    Alert, oriented, no distress, appears stated age  Head:    Normocephalic, atraumatic  Eyes:    PERRL, EOMI, sclera non-icteric        Nose:   Nares without drainage or epistaxis. Mucosa, turbinates normal  Throat:   Moist mucous membranes. Oropharynx without erythema or exudate.  Neck:   Supple. No carotid bruits.  No thyromegaly.  No lymphadenopathy.   Back:     No CVA tenderness, no spinal tenderness  Lungs:     Clear to auscultation bilaterally, without wheezes, rhonchi or rales  Chest wall:    No tenderness to palpitation  Heart:    Regular rate and rhythm without murmurs, gallops, rubs  Abdomen:     Soft, non-tender, nondistended, normal bowel sounds, no organomegaly  Genitalia:    deferred  Rectal:    deferred  Extremities:   No clubbing, cyanosis or edema.  Pulses:   2+ and symmetric all extremities  Skin:   Skin color, texture, turgor normal, no rashes or lesions  Lymph nodes:   Cervical, supraclavicular, and axillary nodes normal  Neurologic:   CNII-XII intact. Normal strength, sensation and reflexes      throughout    Labs on Admission:  Basic Metabolic Panel:  Recent Labs Lab 10/22/14 1902  NA 139  K 4.3  CL 105  CO2 27  GLUCOSE 174*  BUN 17  CREATININE 1.27*  CALCIUM 9.0   Liver Function Tests: No results for input(s): AST, ALT, ALKPHOS, BILITOT, PROT, ALBUMIN in the last 168 hours. No results for input(s): LIPASE, AMYLASE in the last 168 hours. No results for input(s): AMMONIA in the last 168 hours. CBC:  Recent Labs Lab 10/22/14 1902  WBC 5.1  HGB 7.4*  HCT 26.1*  MCV 76.5*  PLT 221   Cardiac Enzymes: No results for input(s): CKTOTAL, CKMB, CKMBINDEX, TROPONINI in the last 168 hours.  BNP  (last 3 results) No results for input(s): PROBNP in the last 8760 hours. CBG: No results for input(s): GLUCAP in the last 168 hours.  Radiological Exams on Admission: Dg Chest 2 View  10/22/2014   CLINICAL DATA:  Shortness of breath for several days. History of atrial fibrillation.  EXAM: CHEST  2 VIEW  COMPARISON:  04/19/2014.  FINDINGS: Stable enlarged cardiac silhouette  and median sternotomy wires. Poor inspiration with mild bibasilar atelectasis and scarring. The pulmonary vasculature is mildly prominent. No pleural fluid. Thoracic spine degenerative changes.  IMPRESSION: 1. Stable cardiomegaly with interval mild pulmonary vascular congestion. 2. Mild bibasilar atelectasis and scarring.   Electronically Signed   By: Claudie Revering M.D.   On: 10/22/2014 15:13    EKG: Independently reviewed.  Assessment/Plan Principal Problem:   Anemia Active Problems:   DM2 (diabetes mellitus, type 2)   Atrial fibrillation   Moderate aortic stenosis   1. Anemia - 1. Treating symptomatic anemia with PRBC transfusion x2 units 2. Repeat CBC in AM 3. Will need to follow up with PCP, needs chronic iron supplementation somehow (either IV or PO with laxatives). 4. guiac negative on exam today. 2. A.Fib - continue rate control and home coumadin 3. DM2 - continue metformin    Code Status: Full Code  Family Communication: Family at bedside Disposition Plan: Admit to inpatient   Time spent: 101 min  GARDNER, JARED M. [facility] Pager [phone]  If 7AM-7PM, please contact the day team taking care of the patient [email or web address] Password [email or web address] 10/22/2014, 9:57 PM

## 2014-10-23 DIAGNOSIS — I480 Paroxysmal atrial fibrillation: Secondary | ICD-10-CM

## 2014-10-23 DIAGNOSIS — D509 Iron deficiency anemia, unspecified: Secondary | ICD-10-CM

## 2014-10-23 DIAGNOSIS — E119 Type 2 diabetes mellitus without complications: Secondary | ICD-10-CM

## 2014-10-23 DIAGNOSIS — I350 Nonrheumatic aortic (valve) stenosis: Secondary | ICD-10-CM

## 2014-10-23 LAB — CBC
HEMATOCRIT: 29.4 % — AB (ref 39.0–52.0)
HEMOGLOBIN: 8.6 g/dL — AB (ref 13.0–17.0)
MCH: 22.6 pg — ABNORMAL LOW (ref 26.0–34.0)
MCHC: 29.3 g/dL — ABNORMAL LOW (ref 30.0–36.0)
MCV: 77.2 fL — AB (ref 78.0–100.0)
PLATELETS: 188 10*3/uL (ref 150–400)
RBC: 3.81 MIL/uL — AB (ref 4.22–5.81)
RDW: 17.8 % — ABNORMAL HIGH (ref 11.5–15.5)
WBC: 5 10*3/uL (ref 4.0–10.5)

## 2014-10-23 LAB — PROTIME-INR
INR: 1.7 — ABNORMAL HIGH (ref 0.00–1.49)
Prothrombin Time: 19.9 seconds — ABNORMAL HIGH (ref 11.6–15.2)

## 2014-10-23 MED ORDER — POLYETHYLENE GLYCOL 3350 17 G PO PACK: 17.0000 g | PACK | Freq: Two times a day (BID) | ORAL | Status: DC

## 2014-10-23 MED ORDER — SODIUM CHLORIDE 0.9 % IV SOLN
250.0000 mg | INTRAVENOUS | Status: DC
  Administered 2014-10-23: 250 mg via INTRAVENOUS
  Filled 2014-10-23: qty 20

## 2014-10-23 MED ORDER — CITALOPRAM HYDROBROMIDE 20 MG PO TABS: 20.0000 mg | ORAL_TABLET | Freq: Every morning | ORAL | Status: DC

## 2014-10-23 MED ORDER — SENNOSIDES-DOCUSATE SODIUM 8.6-50 MG PO TABS: 1.0000 | ORAL_TABLET | Freq: Every day | ORAL | Status: DC

## 2014-10-23 MED ORDER — INFLUENZA VAC SPLIT QUAD 0.5 ML IM SUSY: 0.5000 mL | PREFILLED_SYRINGE | INTRAMUSCULAR | Status: DC

## 2014-10-23 MED ORDER — POLYSACCHARIDE IRON COMPLEX 150 MG PO CAPS: 150.0000 mg | ORAL_CAPSULE | Freq: Two times a day (BID) | ORAL | Status: DC

## 2014-10-23 MED ORDER — WARFARIN SODIUM 7.5 MG PO TABS
7.5000 mg | ORAL_TABLET | Freq: Once | ORAL | Status: AC
  Administered 2014-10-23: 7.5 mg via ORAL
  Filled 2014-10-23: qty 1

## 2014-10-23 NOTE — Discharge Summary (Addendum)
Physician Discharge [place] [id number] DOB: 1932/09/24 DOA: 10/22/2014  PCP: Joel Low, MD  Admit date: 10/22/2014 Discharge date: 10/23/2014  Time spent: 60 minutes  Recommendations for Outpatient Follow-up:  1. Follow-up at Coumadin clinic on Friday, 10/25/2014 for PT/INR check. 2. Follow-up with Joel Low, MD in 1 week. All follow-up patient need a CBC done to follow-up on his H&H.  Discharge Diagnoses:  Principal Problem:   Anemia Active Problems:   DM2 (diabetes mellitus, type 2)   Atrial fibrillation   Moderate aortic stenosis   Discharge Condition: Stable and improved  Diet recommendation: Heart healthy  Filed Weights   10/23/14 0005  Weight: 112.8 kg (248 lb 10.9 oz)    History of present illness:  Per Dr. Ethelene Browns [name] is a 81 y.o. male with h/o chronic iron deficiency anemia, stopped taking iron due to constipation. Patient has had increasing fatigue and SOB with minimal exertion over the past 2 weeks. Seen by PCP one day prior to admission and noted to have HGB of 7 and told to come to ED. Did report intermittent black stools most recently over the past 2 days without any overt bloody stool.   Hospital Course:  #1 symptomatic iron deficiency anemia Patient had presented with fatigue increasing shortness of breath on minimal exertion for 2 weeks prior to admission with some intermittent black stools. Patient was seen in PCPs office hemoglobin noted to be 7 and sent to the ED. Patient was seen in the ED hemoglobin noted to be at 7.4. FOBT was done which was negative. Patient was transfused 1 unit packed red blood cells with appropriate response with hemoglobin coming up to 8.6. Patient was also given some IV iron. Patient improved clinically and was back to baseline by day of discharge. Patient be discharged home on Nu Iron150 mg twice daily in combination with a bowel regimen. Patient is to follow-up with PCP as outpatient. On  follow-up patient need a repeat CBC done.  #2 atrial fibrillation Remained rate controlled during the hospitalization. Patient was maintained on his home regimen of diltiazem. Coumadin was managed by pharmacy during the hospitalization. Patient's INR was subtherapeutic on admission at 1.73 and on day of discharge was 1.70. Patient was given increased doses of Coumadin at 7.5 mg daily on the day of admission and also on day of discharge. Patient be discharged home back on his home regimen. Patient need to follow-up at his Coumadin clinic on Friday, 10/25/2014 for PT/INR check.  #3 diabetes mellitus Remained stable throughout the hospitalization. Patient was maintained on his home regimen of metformin.  The rest of patient's chronic medical issues remained stable throughout the hospitalization and patient be discharged in stable and improved condition.  Procedures: Chest x-ray 10/22/2014 1 unit packed red blood cells 10/23/2014  Consultations:  None  Discharge Exam: Filed Vitals:   10/23/14 1416  BP: 135/61  Pulse: 74  Temp: 98.1 F (36.7 C)  Resp: 18    General: NAD Cardiovascular: RRR Respiratory: CTAB  Discharge Instructions   Discharge Instructions    Diet - [name] sodium heart healthy    Complete by:  As directed      Discharge instructions    Complete by:  As directed   Get your coumadin checked on Friday 10/25/2014. Follow up with Joel Low, MD in 1 week.     Increase activity slowly    Complete by:  As directed           Current Discharge Medication  List    START taking these medications   Details  iron polysaccharides (NIFEREX) 150 MG capsule Take 1 capsule (150 mg total) by mouth 2 (two) times daily. Qty: 60 capsule, Refills: 0    polyethylene glycol (MIRALAX / GLYCOLAX) packet Take 17 g by mouth 2 (two) times daily. Qty: 14 each, Refills: 0    senna-docusate (SENOKOT-S) 8.6-50 MG per tablet Take 1 tablet by mouth at bedtime.      CONTINUE these  medications which have CHANGED   Details  citalopram (CELEXA) 20 MG tablet Take 1 tablet (20 mg total) by mouth every morning. Qty: 30 tablet, Refills: 0      CONTINUE these medications which have NOT CHANGED   Details  allopurinol (ZYLOPRIM) 100 MG tablet Take 100 mg by mouth daily.     diltiazem (DILACOR XR) 120 MG 24 hr capsule Take 120 mg by mouth at bedtime.    fluticasone (FLONASE) 50 MCG/ACT nasal spray Place 1 spray into both nostrils 2 (two) times daily.    furosemide (LASIX) 40 MG tablet Take 40 mg by mouth daily.    lovastatin (MEVACOR) 20 MG tablet Take 20 mg by mouth every morning.     metFORMIN (GLUCOPHAGE) 1000 MG tablet Take 1,000 mg by mouth 2 (two) times daily.  Refills: 0    OVER THE COUNTER MEDICATION Place 1 drop into both eyes 3 (three) times daily. For dry eyes    potassium chloride SA (K-DUR,KLOR-CON) 20 MEQ tablet Take 1 tablet by mouth daily. Refills: 0    temazepam (RESTORIL) 15 MG capsule Take 1 capsule by mouth at bedtime.  Refills: 0    Tiotropium Bromide-Olodaterol (STIOLTO RESPIMAT) 2.5-2.5 MCG/ACT AERS Inhale 2 puffs into the lungs daily. Qty: 1 Inhaler, Refills: 0    valsartan (DIOVAN) 160 MG tablet Take 160 mg by mouth every morning.     warfarin (COUMADIN) 5 MG tablet Take 5 mg by mouth every evening.        Allergies  Allergen Reactions  . Antihistamines, Loratadine-Type     depression  . Meclizine     depression  . Other     Beta blockers-Novacaine depression   Follow-up Information    Follow up with [name],[name], MD. Schedule an appointment as soon as possible for a visit in 1 week.   Specialty:  Internal Medicine   Contact information:   [address] [place] [address] [phone]       Follow up On 10/25/2014.   Why:  coumadin clinic       The results of significant diagnostics from this hospitalization (including imaging, microbiology, ancillary and laboratory) are listed below for reference.     Significant Diagnostic Studies: Dg Chest 2 View  10/22/2014   CLINICAL DATA:  Shortness of breath for several days. History of atrial fibrillation.  EXAM: CHEST  2 VIEW  COMPARISON:  04/19/2014.  FINDINGS: Stable enlarged cardiac silhouette and median sternotomy wires. Poor inspiration with mild bibasilar atelectasis and scarring. The pulmonary vasculature is mildly prominent. No pleural fluid. Thoracic spine degenerative changes.  IMPRESSION: 1. Stable cardiomegaly with interval mild pulmonary vascular congestion. 2. Mild bibasilar atelectasis and scarring.   Electronically Signed   By: Claudie Revering M.D.   On: 10/22/2014 15:13    Microbiology: No results found for this or any previous visit (from the past 240 hour(s)).   Labs: Basic Metabolic Panel:  Recent Labs Lab 10/22/14 1902  NA 139  K 4.3  CL  105  CO2 27  GLUCOSE 174*  BUN 17  CREATININE 1.27*  CALCIUM 9.0   Liver Function Tests: No results for input(s): AST, ALT, ALKPHOS, BILITOT, PROT, ALBUMIN in the last 168 hours. No results for input(s): LIPASE, AMYLASE in the last 168 hours. No results for input(s): AMMONIA in the last 168 hours. CBC:  Recent Labs Lab 10/22/14 1902 10/23/14 0842  WBC 5.1 5.0  HGB 7.4* 8.6*  HCT 26.1* 29.4*  MCV 76.5* 77.2*  PLT 221 188   Cardiac Enzymes: No results for input(s): CKTOTAL, CKMB, CKMBINDEX, TROPONINI in the last 168 hours. BNP: BNP (last 3 results) No results for input(s): BNP in the last 8760 hours.  ProBNP (last 3 results) No results for input(s): PROBNP in the last 8760 hours.  CBG:  Recent Labs Lab 10/22/14 2014  GLUCAP 179*       SignedIrine Seal MD [facility] 10/23/2014, 3:46 PM

## 2014-10-23 NOTE — Progress Notes (Addendum)
ANTICOAGULATION CONSULT NOTE - Follow Up Consult  Pharmacy Consult for Warfarin Indication: atrial fibrillation  Allergies  Allergen Reactions  . Antihistamines, Loratadine-Type     depression  . Meclizine     depression  . Other     Beta blockers-Novacaine depression    Patient Measurements: Height: 6' (182.9 cm) Weight: 248 lb 10.9 oz (112.8 kg) IBW/kg (Calculated) : 77.6   Vital Signs: Temp: 98.1 F (36.7 C) (09/21 0637) Temp Source: Oral (09/21 0637) BP: 132/69 mmHg (09/21 0637) Pulse Rate: 74 (09/21 0637)  Labs:  Recent Labs  10/22/14 1902 10/22/14 2051 10/23/14 0842  HGB 7.4*  --  8.6*  HCT 26.1*  --  29.4*  PLT 221  --  188  LABPROT  --  20.2* 19.9*  INR  --  1.73* 1.70*  CREATININE 1.27*  --   --     Estimated Creatinine Clearance: 61.2 mL/min (by C-G formula based on Cr of 1.27).   Assessment: 29 y/oM with PMH of a-fib and chronic iron deficiency anemia who presented to [facility] ED on 9/20 with increasing fatigue and SOB with minimal exertion over the past 2 weeks. Patient found to have Hgb of 7 at PCP's office day prior to admission and told to come to ED. Patient also reported intermittent black stools most recently over past 2 days without any overt bloody stool. INR subtherapeutic on admission at 1.73. FOBT negative. Pharmacy consulted to manage warfarin therapy while patient in the hospital.  Home warfarin regimen: 5 mg PO daily  Today, 10/23/2014:  Hgb improved today to 8.6 s/p 2 units PRBCs, Pltc WNL  Receiving IV iron  INR decreased to 1.7 today despite boosted dose yesterday  No bleeding issue reported per nursing  Regular diet  Drug interactions: Celexa, Allopurinol (both chronic medications)  Goal of Therapy:  INR 2-3 Monitor platelets by anticoagulation protocol: Yes   Plan:   Repeat Warfarin 7.5 mg PO x 1 today at 1800.  Daily PT/INR.  CBC in AM.  Monitor closely for s/s of bleeding.   Lindell Spar, PharmD, BCPS Pager:  [phone] 10/23/2014 1:28 PM

## 2014-10-23 NOTE — Progress Notes (Signed)
Paged [facility] admissions to notify patient arrived to 1525

## 2014-10-23 NOTE — Progress Notes (Addendum)
MEDICATION RELATED CONSULT NOTE - INITIAL   Pharmacy Consult for IV iron Indication: iron deficiency anemia  Allergies  Allergen Reactions  . Antihistamines, Loratadine-Type     depression  . Meclizine     depression  . Other     Beta blockers-Novacaine depression    Patient Measurements: Height: 6' (182.9 cm) Weight: 248 lb 10.9 oz (112.8 kg) IBW/kg (Calculated) : 77.6  Vital Signs: Temp: 98.1 F (36.7 C) (09/21 0637) Temp Source: Oral (09/21 5621) BP: 132/69 mmHg (09/21 3086) Pulse Rate: 74 (09/21 0637) Intake/Output from previous day: 09/20 0701 - 09/21 0700 In: 940 [P.O.:200; I.V.:60; Blood:680] Out: 600 [Urine:600] Intake/Output from this shift:    Labs:  Recent Labs  10/22/14 1902 10/23/14 0842  WBC 5.1 5.0  HGB 7.4* 8.6*  HCT 26.1* 29.4*  PLT 221 188  CREATININE 1.27*  --    Estimated Creatinine Clearance: 61.2 mL/min (by C-G formula based on Cr of 1.27).   Microbiology: No results found for this or any previous visit (from the past 720 hour(s)).  Medical History: Past Medical History  Diagnosis Date  . COPD (chronic obstructive pulmonary disease)   . Atrial fibrillation 1991    with multiple DCCV  . Type 2 diabetes mellitus   . Bipolar affective disorder   . Vertigo   . Anemia   . Gout   . Fatigue     last year or so  . OSA (obstructive sleep apnea)     severe, uses bipap 16 1/2 by 12 or 13 setting  . On home oxygen therapy 2 1/2 liters with bipap at night  . Hypertension   . Dyslipidemia   . Anxiety   . Insomnia   . Allergic rhinitis   . Obesity   . Diabetic neuropathy     in feet  . Aortic stenosis 2012    mild to mod     Assessment: 81 y/oM with PMH of a-fib and chronic iron deficiency anemia who presented to [facility] ED on 9/20 with increasing fatigue and SOB with minimal exertion over the past 2 weeks. Patient found to have Hgb of 7 at PCP's office day prior to admission and told to come to ED. Patient also reported intermittent  black stools most recently over past 2 days without any overt bloody stool. Patient reports having stopped taking iron supplementation due to constipation. Hgb improved today to 8.6 s/p 2 units PRBCs. Pharmacy consulted to assist with dosing of IV iron for this patient.   Goal of Therapy:  Improvement in iron stores and Hgb  Plan:   Ferric gluconate 250mg  IV every 48 hours up to 4 doses for total of 1000 mg.  Monitor Hgb, clinical course.   Lindell Spar, PharmD, BCPS Pager: [phone] 10/23/2014 1:23 PM

## 2014-10-23 NOTE — Progress Notes (Signed)
Patient's vital signs stable, tolerating regular diet, no signs of active bleeding present.  Patient received blood and iron infusions and tolerated infusions well.  Discharge instructions given to patient.  Patient discharged to home.

## 2014-10-24 LAB — TYPE AND SCREEN
ABO/RH(D): O POS
Antibody Screen: NEGATIVE
Unit division: 0
Unit division: 0

## 2015-02-04 ENCOUNTER — Telehealth: Payer: Self-pay | Admitting: Cardiology

## 2015-02-04 NOTE — Telephone Encounter (Signed)
Received records from [place] for appointment on 03/10/15 with Dr Percival Spanish.  Records given to [facility] (medical records) for Dr Hochrein's schedule on 03/10/15. lp

## 2015-02-26 ENCOUNTER — Encounter: Payer: Self-pay | Admitting: *Deleted

## 2015-02-26 DIAGNOSIS — E291 Testicular hypofunction: Secondary | ICD-10-CM | POA: Diagnosis not present

## 2015-02-26 DIAGNOSIS — Z7901 Long term (current) use of anticoagulants: Secondary | ICD-10-CM | POA: Diagnosis not present

## 2015-03-02 DIAGNOSIS — R0689 Other abnormalities of breathing: Secondary | ICD-10-CM | POA: Diagnosis not present

## 2015-03-02 DIAGNOSIS — G4733 Obstructive sleep apnea (adult) (pediatric): Secondary | ICD-10-CM | POA: Diagnosis not present

## 2015-03-10 ENCOUNTER — Encounter: Payer: PPO | Admitting: Cardiology

## 2015-03-10 NOTE — Progress Notes (Signed)
EError

## 2015-03-11 ENCOUNTER — Encounter: Payer: Self-pay | Admitting: *Deleted

## 2015-03-26 DIAGNOSIS — E291 Testicular hypofunction: Secondary | ICD-10-CM | POA: Diagnosis not present

## 2015-03-26 DIAGNOSIS — Z7901 Long term (current) use of anticoagulants: Secondary | ICD-10-CM | POA: Diagnosis not present

## 2015-04-01 DIAGNOSIS — G4733 Obstructive sleep apnea (adult) (pediatric): Secondary | ICD-10-CM | POA: Diagnosis not present

## 2015-04-01 DIAGNOSIS — R0689 Other abnormalities of breathing: Secondary | ICD-10-CM | POA: Diagnosis not present

## 2015-04-02 DIAGNOSIS — E291 Testicular hypofunction: Secondary | ICD-10-CM | POA: Diagnosis not present

## 2015-04-02 DIAGNOSIS — Z7984 Long term (current) use of oral hypoglycemic drugs: Secondary | ICD-10-CM | POA: Diagnosis not present

## 2015-04-02 DIAGNOSIS — E1165 Type 2 diabetes mellitus with hyperglycemia: Secondary | ICD-10-CM | POA: Diagnosis not present

## 2015-04-07 DIAGNOSIS — G4733 Obstructive sleep apnea (adult) (pediatric): Secondary | ICD-10-CM | POA: Diagnosis not present

## 2015-04-08 DIAGNOSIS — Z0000 Encounter for general adult medical examination without abnormal findings: Secondary | ICD-10-CM | POA: Diagnosis not present

## 2015-04-08 DIAGNOSIS — N5201 Erectile dysfunction due to arterial insufficiency: Secondary | ICD-10-CM | POA: Diagnosis not present

## 2015-04-08 DIAGNOSIS — R972 Elevated prostate specific antigen [PSA]: Secondary | ICD-10-CM | POA: Diagnosis not present

## 2015-04-24 DIAGNOSIS — E291 Testicular hypofunction: Secondary | ICD-10-CM | POA: Diagnosis not present

## 2015-04-24 DIAGNOSIS — Z7901 Long term (current) use of anticoagulants: Secondary | ICD-10-CM | POA: Diagnosis not present

## 2015-05-22 DIAGNOSIS — E291 Testicular hypofunction: Secondary | ICD-10-CM | POA: Diagnosis not present

## 2015-05-22 DIAGNOSIS — Z7901 Long term (current) use of anticoagulants: Secondary | ICD-10-CM | POA: Diagnosis not present

## 2015-06-19 DIAGNOSIS — Z7901 Long term (current) use of anticoagulants: Secondary | ICD-10-CM | POA: Diagnosis not present

## 2015-06-19 DIAGNOSIS — E291 Testicular hypofunction: Secondary | ICD-10-CM | POA: Diagnosis not present

## 2015-06-28 ENCOUNTER — Emergency Department (HOSPITAL_COMMUNITY): Payer: PPO

## 2015-06-28 ENCOUNTER — Encounter (HOSPITAL_COMMUNITY): Payer: Self-pay | Admitting: Emergency Medicine

## 2015-06-28 ENCOUNTER — Inpatient Hospital Stay (HOSPITAL_COMMUNITY)
Admission: EM | Admit: 2015-06-28 | Discharge: 2015-06-30 | DRG: 190 | Disposition: A | Discharge status: Home Health | Payer: PPO | Attending: Internal Medicine | Admitting: Internal Medicine

## 2015-06-28 DIAGNOSIS — E119 Type 2 diabetes mellitus without complications: Secondary | ICD-10-CM

## 2015-06-28 DIAGNOSIS — R0602 Shortness of breath: Secondary | ICD-10-CM | POA: Diagnosis not present

## 2015-06-28 DIAGNOSIS — J9621 Acute and chronic respiratory failure with hypoxia: Secondary | ICD-10-CM | POA: Diagnosis not present

## 2015-06-28 DIAGNOSIS — F419 Anxiety disorder, unspecified: Secondary | ICD-10-CM | POA: Diagnosis present

## 2015-06-28 DIAGNOSIS — D509 Iron deficiency anemia, unspecified: Secondary | ICD-10-CM | POA: Diagnosis not present

## 2015-06-28 DIAGNOSIS — I110 Hypertensive heart disease with heart failure: Secondary | ICD-10-CM | POA: Diagnosis not present

## 2015-06-28 DIAGNOSIS — E662 Morbid (severe) obesity with alveolar hypoventilation: Secondary | ICD-10-CM | POA: Diagnosis present

## 2015-06-28 DIAGNOSIS — I482 Chronic atrial fibrillation, unspecified: Secondary | ICD-10-CM | POA: Diagnosis present

## 2015-06-28 DIAGNOSIS — J449 Chronic obstructive pulmonary disease, unspecified: Secondary | ICD-10-CM | POA: Diagnosis not present

## 2015-06-28 DIAGNOSIS — J441 Chronic obstructive pulmonary disease with (acute) exacerbation: Principal | ICD-10-CM | POA: Diagnosis present

## 2015-06-28 DIAGNOSIS — Z7901 Long term (current) use of anticoagulants: Secondary | ICD-10-CM | POA: Diagnosis not present

## 2015-06-28 DIAGNOSIS — I5033 Acute on chronic diastolic (congestive) heart failure: Secondary | ICD-10-CM | POA: Diagnosis not present

## 2015-06-28 DIAGNOSIS — K7460 Unspecified cirrhosis of liver: Secondary | ICD-10-CM | POA: Diagnosis not present

## 2015-06-28 DIAGNOSIS — Z6832 Body mass index (BMI) 32.0-32.9, adult: Secondary | ICD-10-CM | POA: Diagnosis not present

## 2015-06-28 DIAGNOSIS — F319 Bipolar disorder, unspecified: Secondary | ICD-10-CM | POA: Diagnosis not present

## 2015-06-28 DIAGNOSIS — Z9981 Dependence on supplemental oxygen: Secondary | ICD-10-CM | POA: Diagnosis not present

## 2015-06-28 DIAGNOSIS — E8770 Fluid overload, unspecified: Secondary | ICD-10-CM | POA: Diagnosis not present

## 2015-06-28 DIAGNOSIS — Z7984 Long term (current) use of oral hypoglycemic drugs: Secondary | ICD-10-CM | POA: Diagnosis not present

## 2015-06-28 DIAGNOSIS — R0600 Dyspnea, unspecified: Secondary | ICD-10-CM

## 2015-06-28 DIAGNOSIS — J209 Acute bronchitis, unspecified: Secondary | ICD-10-CM | POA: Diagnosis present

## 2015-06-28 DIAGNOSIS — Z87891 Personal history of nicotine dependence: Secondary | ICD-10-CM

## 2015-06-28 DIAGNOSIS — I350 Nonrheumatic aortic (valve) stenosis: Secondary | ICD-10-CM | POA: Diagnosis not present

## 2015-06-28 DIAGNOSIS — Z888 Allergy status to other drugs, medicaments and biological substances status: Secondary | ICD-10-CM

## 2015-06-28 DIAGNOSIS — E785 Hyperlipidemia, unspecified: Secondary | ICD-10-CM | POA: Diagnosis not present

## 2015-06-28 DIAGNOSIS — I272 Other secondary pulmonary hypertension: Secondary | ICD-10-CM | POA: Diagnosis present

## 2015-06-28 DIAGNOSIS — Z7951 Long term (current) use of inhaled steroids: Secondary | ICD-10-CM | POA: Diagnosis not present

## 2015-06-28 DIAGNOSIS — I5031 Acute diastolic (congestive) heart failure: Secondary | ICD-10-CM | POA: Diagnosis not present

## 2015-06-28 DIAGNOSIS — I509 Heart failure, unspecified: Secondary | ICD-10-CM

## 2015-06-28 DIAGNOSIS — E1140 Type 2 diabetes mellitus with diabetic neuropathy, unspecified: Secondary | ICD-10-CM | POA: Diagnosis not present

## 2015-06-28 DIAGNOSIS — J90 Pleural effusion, not elsewhere classified: Secondary | ICD-10-CM | POA: Diagnosis not present

## 2015-06-28 DIAGNOSIS — J440 Chronic obstructive pulmonary disease with acute lower respiratory infection: Secondary | ICD-10-CM | POA: Diagnosis not present

## 2015-06-28 DIAGNOSIS — R079 Chest pain, unspecified: Secondary | ICD-10-CM | POA: Diagnosis not present

## 2015-06-28 DIAGNOSIS — Z79899 Other long term (current) drug therapy: Secondary | ICD-10-CM | POA: Diagnosis not present

## 2015-06-28 DIAGNOSIS — N179 Acute kidney failure, unspecified: Secondary | ICD-10-CM | POA: Diagnosis not present

## 2015-06-28 DIAGNOSIS — Z66 Do not resuscitate: Secondary | ICD-10-CM | POA: Diagnosis present

## 2015-06-28 DIAGNOSIS — M109 Gout, unspecified: Secondary | ICD-10-CM | POA: Diagnosis present

## 2015-06-28 LAB — BASIC METABOLIC PANEL
ANION GAP: 8 (ref 5–15)
BUN: 21 mg/dL — AB (ref 6–20)
CHLORIDE: 101 mmol/L (ref 101–111)
CO2: 28 mmol/L (ref 22–32)
Calcium: 9.1 mg/dL (ref 8.9–10.3)
Creatinine, Ser: 1.61 mg/dL — ABNORMAL HIGH (ref 0.61–1.24)
GFR calc Af Amer: 45 mL/min — ABNORMAL LOW (ref >60)
GFR calc non Af Amer: 39 mL/min — ABNORMAL LOW (ref >60)
Glucose, Bld: 120 mg/dL — ABNORMAL HIGH (ref 65–99)
POTASSIUM: 4 mmol/L (ref 3.5–5.1)
SODIUM: 137 mmol/L (ref 135–145)

## 2015-06-28 LAB — HEPATIC FUNCTION PANEL
ALBUMIN: 4.2 g/dL (ref 3.5–5.0)
ALK PHOS: 71 U/L (ref 38–126)
ALT: 11 U/L — ABNORMAL LOW (ref 17–63)
AST: 19 U/L (ref 15–41)
Bilirubin, Direct: <0.1 mg/dL — ABNORMAL LOW (ref 0.1–0.5)
TOTAL PROTEIN: 7.8 g/dL (ref 6.5–8.1)
Total Bilirubin: 0.6 mg/dL (ref 0.3–1.2)

## 2015-06-28 LAB — CBC
HEMATOCRIT: 31.4 % — AB (ref 39.0–52.0)
HEMOGLOBIN: 9.9 g/dL — AB (ref 13.0–17.0)
MCH: 28.2 pg (ref 26.0–34.0)
MCHC: 31.5 g/dL (ref 30.0–36.0)
MCV: 89.5 fL (ref 78.0–100.0)
Platelets: 204 10*3/uL (ref 150–400)
RBC: 3.51 MIL/uL — ABNORMAL LOW (ref 4.22–5.81)
RDW: 14.3 % (ref 11.5–15.5)
WBC: 6.1 10*3/uL (ref 4.0–10.5)

## 2015-06-28 LAB — PROTIME-INR
INR: 2.03 — AB (ref 0.00–1.49)
PROTHROMBIN TIME: 22.9 s — AB (ref 11.6–15.2)

## 2015-06-28 LAB — I-STAT TROPONIN, ED: Troponin i, poc: 0.02 ng/mL (ref 0.00–0.08)

## 2015-06-28 LAB — BRAIN NATRIURETIC PEPTIDE: B NATRIURETIC PEPTIDE 5: 57 pg/mL (ref 0.0–100.0)

## 2015-06-28 MED ORDER — LEVOFLOXACIN IN D5W 750 MG/150ML IV SOLN
750.0000 mg | Freq: Once | INTRAVENOUS | Status: AC
  Administered 2015-06-29: 750 mg via INTRAVENOUS
  Filled 2015-06-28: qty 150

## 2015-06-28 MED ORDER — FUROSEMIDE 10 MG/ML IJ SOLN
80.0000 mg | Freq: Once | INTRAMUSCULAR | Status: AC
  Administered 2015-06-28: 80 mg via INTRAVENOUS
  Filled 2015-06-28: qty 8

## 2015-06-28 MED ORDER — IPRATROPIUM-ALBUTEROL 0.5-2.5 (3) MG/3ML IN SOLN
3.0000 mL | Freq: Once | RESPIRATORY_TRACT | Status: AC
  Administered 2015-06-29: 3 mL via RESPIRATORY_TRACT
  Filled 2015-06-28: qty 3

## 2015-06-28 NOTE — ED Provider Notes (Signed)
CSN: [id number]     Arrival date & time 06/28/15  2208 History  By signing my name below, I, [name], attest that this documentation has been prepared under the direction and in the presence of Leo Grosser, MD. Electronically Signed: [name], ED Scribe. 06/28/2015. 11:12 PM.   Chief Complaint  Patient presents with  . Shortness of Breath  . Chest Pain  . Dizziness     The history is provided by the patient. No language interpreter was used.    Joel [name] is a 82 y.o. male with a medical hx of COPD, A-fib, aortic stenosis, who presents to the Emergency Department complaining of SOB onset 3-4 days worsening this afternoon. Pt reports that tonight he began to have nausea and cough. Pt is having associated symptoms of chest soreness due to cough and mild abdominal distention. Pt reports that he had liver issues and informed that he had cirrhosis in 1992 and he stopped drinking following the diagnosis. Pt states that he has been evaluated for his symptoms. He notes that he has tried old Rx cough syrup with codeine with no relief of his symptoms. He denies weight gain, vomiting, and any other symptoms. Pt takes 80 mg lasix once daily.    Past Medical History  Diagnosis Date  . COPD (chronic obstructive pulmonary disease) ([place])   . Atrial fibrillation ([place]) 1991    with multiple DCCV  . Type 2 diabetes mellitus ([place])   . Bipolar affective disorder ([place])   . Vertigo   . Anemia   . Gout   . Fatigue     last year or so  . OSA (obstructive sleep apnea)     severe, uses bipap 16 1/2 by 12 or 13 setting  . On home oxygen therapy 2 1/2 liters with bipap at night  . Hypertension   . Dyslipidemia   . Anxiety   . Insomnia   . Allergic rhinitis   . Obesity   . Diabetic neuropathy ([place])     in feet  . Aortic stenosis 2012    mild to mod    Past Surgical History  Procedure Laterality Date  . Maze  1998  . Knee surgery  1970's    rt  . Shoulder surgery  7-8 yrs ago    rt   . Cardioversion  yrs ago  . Electro shock  1969    for depression  . Colonoscopy with propofol N/A 01/23/2013    Procedure: COLONOSCOPY WITH PROPOFOL;  Surgeon: Joel Fair, MD;  Location: [facility] ENDOSCOPY;  Service: Endoscopy;  Laterality: N/A;  . Esophagogastroduodenoscopy (egd) with propofol N/A 01/23/2013    Procedure: ESOPHAGOGASTRODUODENOSCOPY (EGD) WITH PROPOFOL;  Surgeon: Joel Fair, MD;  Location: [facility] ENDOSCOPY;  Service: Endoscopy;  Laterality: N/A;   Family History  Problem Relation Age of Onset  . Tuberculosis Maternal Grandmother   . Heart attack Neg Hx    Social History  Substance Use Topics  . Smoking status: Former Smoker -- 2.00 packs/day for 25 years    Types: Cigarettes    Quit date: 07/08/1976  . Smokeless tobacco: Never Used  . Alcohol Use: No     Comment: in AA since 1977, no alcohol since 1977    Review of Systems  Constitutional: Negative for unexpected weight change.  Respiratory: Positive for cough and shortness of breath.   Cardiovascular: Positive for chest pain (soreness due to cough).  Gastrointestinal: Positive for nausea and abdominal distention. Negative for vomiting.  Neurological: Positive for dizziness.  All other systems reviewed and are negative.     Allergies  Antihistamines, loratadine-type; Meclizine; and Other  Home Medications   Prior to Admission medications   Medication Sig Start Date End Date Taking? Authorizing Provider  allopurinol (ZYLOPRIM) 100 MG tablet Take 100 mg by mouth daily.     Historical Provider, MD  citalopram (CELEXA) 20 MG tablet Take 1 tablet (20 mg total) by mouth every morning. 10/23/14   Eugenie Filler, MD  diltiazem (DILACOR XR) 120 MG 24 hr capsule Take 120 mg by mouth at bedtime.    Historical Provider, MD  fluticasone (FLONASE) 50 MCG/ACT nasal spray Place 1 spray into both nostrils 2 (two) times daily.    Historical Provider, MD  furosemide (LASIX) 40 MG tablet Take 40 mg by mouth daily.     Historical Provider, MD  iron polysaccharides (NIFEREX) 150 MG capsule Take 1 capsule (150 mg total) by mouth 2 (two) times daily. 10/23/14   Eugenie Filler, MD  lovastatin (MEVACOR) 20 MG tablet Take 20 mg by mouth every morning.     Historical Provider, MD  metFORMIN (GLUCOPHAGE) 1000 MG tablet Take 1,000 mg by mouth 2 (two) times daily.  05/17/14   Historical Provider, MD  OVER THE COUNTER MEDICATION Place 1 drop into both eyes 3 (three) times daily. For dry eyes    Historical Provider, MD  polyethylene glycol (MIRALAX / GLYCOLAX) packet Take 17 g by mouth 2 (two) times daily. 10/23/14   Eugenie Filler, MD  potassium chloride SA (K-DUR,KLOR-CON) 20 MEQ tablet Take 1 tablet by mouth daily. 09/10/14   Historical Provider, MD  senna-docusate (SENOKOT-S) 8.6-50 MG per tablet Take 1 tablet by mouth at bedtime. 10/23/14   Eugenie Filler, MD  temazepam (RESTORIL) 15 MG capsule Take 1 capsule by mouth at bedtime.  04/07/14   Historical Provider, MD  Tiotropium Bromide-Olodaterol (STIOLTO RESPIMAT) 2.5-2.5 MCG/ACT AERS Inhale 2 puffs into the lungs daily. 04/19/14   Deneise Lever, MD  valsartan (DIOVAN) 160 MG tablet Take 160 mg by mouth every morning.     Historical Provider, MD  warfarin (COUMADIN) 5 MG tablet Take 5 mg by mouth every evening.     Historical Provider, MD   BP 145/65 mmHg  Pulse 76  Temp(Src) 98.1 F (36.7 C) (Oral)  Resp 18  SpO2 99% Physical Exam  Constitutional: He is oriented to person, place, and time. He appears well-developed and well-nourished. No distress.  Pt is in good spirits and speaking in complete sentences.   HENT:  Head: Normocephalic and atraumatic.  Eyes: EOM are normal.  Neck: Neck supple.  Cardiovascular: Normal rate, regular rhythm and normal heart sounds.   Pulmonary/Chest: Effort normal. No respiratory distress. He has wheezes. He has rales.  Rales and expiratory wheezing to the mid lung fields.   Abdominal: Soft. He exhibits distension. There is no  tenderness.  Marked abdominal distension.   Musculoskeletal: Normal range of motion.  Neurological: He is alert and oriented to person, place, and time.  Skin: Skin is warm and dry.  Psychiatric: He has a normal mood and affect. His behavior is normal.  Nursing note and vitals reviewed.   ED Course  Procedures (including critical care time)  Emergency Focused Ultrasound Exam Limited Abdominal Ultrasound for Evaluation of Free Fluid  Performed and interpreted by Dr. Laneta Simmers Indication: abdominal pain Multiple views of the abdomen are obtained with a multi frequency probe.  Findings: Free fluid is  not present.  Interpretation: no evidence of free fluid consistent with ascites CPT Code: [phone]  Emergency Focused Ultrasound Exam Limited Ultrasound of the Heart and Pericardium  Performed and interpreted by Dr. Laneta Simmers Indication: shortness of breath Multiple views of the heart, pericardium, and IVC are obtained with a multi frequency probe.  Findings: difficult windows with grossly normal contractility, no anechoic fluid, variable IVC collapse Interpretation: difficult to estimate ejection fraction with habitus and poor windows, no pericardial effusion, no elevation of CVP Images archived electronically.  CPT Code: O9751839   DIAGNOSTIC STUDIES: Oxygen Saturation is 99% on RA, nl by my interpretation.    COORDINATION OF CARE: 11:03 PM Discussed treatment plan with pt at bedside which includes labs, CXR, EKG, bladder scan, and pt agreed to plan.    Labs Review Labs Reviewed  BASIC METABOLIC PANEL - Abnormal; Notable for the following:    Glucose, Bld 120 (*)    BUN 21 (*)    Creatinine, Ser 1.61 (*)    GFR calc non Af Amer 39 (*)    GFR calc Af Amer 45 (*)    All other components within normal limits  CBC - Abnormal; Notable for the following:    RBC 3.51 (*)    Hemoglobin 9.9 (*)    HCT 31.4 (*)    All other components within normal limits  PROTIME-INR - Abnormal; Notable  for the following:    Prothrombin Time 22.9 (*)    INR 2.03 (*)    All other components within normal limits  HEPATIC FUNCTION PANEL - Abnormal; Notable for the following:    ALT 11 (*)    Bilirubin, Direct <0.1 (*)    All other components within normal limits  BRAIN NATRIURETIC PEPTIDE  PROCALCITONIN  I-STAT TROPOININ, ED  I-STAT CG4 LACTIC ACID, ED    Imaging Review Dg Chest 2 View  06/28/2015  CLINICAL DATA:  Chest pain and shortness of breath EXAM: CHEST  2 VIEW COMPARISON:  10/22/2014 chest radiograph. FINDINGS: Sternotomy wires appear aligned and intact. Stable cardiomediastinal silhouette with mild cardiomegaly. No pneumothorax. Trace bilateral pleural effusions. Mild pulmonary edema. IMPRESSION: 1. Mild congestive heart failure. 2. Trace bilateral pleural effusions. Electronically Signed   By: Ilona Sorrel M.D.   On: 06/28/2015 23:20   I have personally reviewed and evaluated these images and lab results as part of my medical decision-making.   EKG Interpretation   Date/Time:  Saturday Jun 28 2015 22:17:40 EDT Ventricular Rate:  80 PR Interval:    QRS Duration: 109 QT Interval:  399 QTC Calculation: 460 R Axis:   61 Text Interpretation:  Atrial fibrillation Minimal ST depression, diffuse  leads No significant change since last tracing Confirmed by Franciso Dierks MD,  Quillian Quince [id number]) on 06/28/2015 10:23:03 PM      MDM   Final diagnoses:  Acute on chronic congestive heart failure, unspecified congestive heart failure type (HCC)  Bilateral pleural effusion  Shortness of breath  Chronic obstructive pulmonary disease with acute exacerbation ([place])    82 y.o. male presents with Some chest tightness, shortness of breath, dizziness and cough which has worsened over the last 3 days. He has noted some weight loss and abdominal distention that has progressively worsened. He appears grossly fluid overloaded and his chest x-ray shows worsening cardiomegaly, bilateral effusions, and  increasing pulmonary edema from prior available exam. He lives alone and has been taking his 80 mg of Lasix daily every morning. He is not hypoxemic but he does have ongoing  progressive symptoms and no recent available echocardiography for comparison to baseline and no obvious underlying cause of heart failure exacerbation. Coumadin is therapeutic and his troponin is negative so I doubt ACS or PE as the cause. He does have some wheezing which is difficult to determine if this is related to heart failure or COPD so he was covered for atypicals with levaquin and provided breathing treatments. His creatinine is slightly higher than normal and in the context of apparent worsening heart failure and declining renal function he will be diuresed and require some monitoring for response to therapy. Hospitalist was consulted for admission and will see the patient in the emergency department.   I personally performed the services described in this documentation, which was scribed in my presence. The recorded information has been reviewed and is accurate.     Leo Grosser, MD 06/29/15 [phone]

## 2015-06-28 NOTE — ED Notes (Signed)
Pt states about 2 days ago he started having CP, SOB, and dizziness. Pt states he has also not been sleeping was given pills by PCP to help sleep but has not worked. Pt c/o nausea.

## 2015-06-29 ENCOUNTER — Inpatient Hospital Stay (HOSPITAL_COMMUNITY): Payer: PPO

## 2015-06-29 ENCOUNTER — Encounter (HOSPITAL_COMMUNITY): Payer: Self-pay | Admitting: Internal Medicine

## 2015-06-29 DIAGNOSIS — J440 Chronic obstructive pulmonary disease with acute lower respiratory infection: Secondary | ICD-10-CM

## 2015-06-29 DIAGNOSIS — I509 Heart failure, unspecified: Secondary | ICD-10-CM | POA: Diagnosis not present

## 2015-06-29 DIAGNOSIS — J209 Acute bronchitis, unspecified: Secondary | ICD-10-CM | POA: Diagnosis present

## 2015-06-29 DIAGNOSIS — I5031 Acute diastolic (congestive) heart failure: Secondary | ICD-10-CM | POA: Diagnosis not present

## 2015-06-29 DIAGNOSIS — E662 Morbid (severe) obesity with alveolar hypoventilation: Secondary | ICD-10-CM | POA: Diagnosis not present

## 2015-06-29 DIAGNOSIS — R0600 Dyspnea, unspecified: Secondary | ICD-10-CM | POA: Diagnosis not present

## 2015-06-29 DIAGNOSIS — Z9981 Dependence on supplemental oxygen: Secondary | ICD-10-CM | POA: Diagnosis not present

## 2015-06-29 DIAGNOSIS — J9621 Acute and chronic respiratory failure with hypoxia: Secondary | ICD-10-CM | POA: Diagnosis not present

## 2015-06-29 DIAGNOSIS — D509 Iron deficiency anemia, unspecified: Secondary | ICD-10-CM | POA: Diagnosis not present

## 2015-06-29 DIAGNOSIS — J441 Chronic obstructive pulmonary disease with (acute) exacerbation: Secondary | ICD-10-CM | POA: Diagnosis not present

## 2015-06-29 DIAGNOSIS — Z87891 Personal history of nicotine dependence: Secondary | ICD-10-CM | POA: Diagnosis not present

## 2015-06-29 DIAGNOSIS — I482 Chronic atrial fibrillation: Secondary | ICD-10-CM

## 2015-06-29 DIAGNOSIS — E785 Hyperlipidemia, unspecified: Secondary | ICD-10-CM | POA: Diagnosis not present

## 2015-06-29 DIAGNOSIS — I110 Hypertensive heart disease with heart failure: Secondary | ICD-10-CM | POA: Diagnosis not present

## 2015-06-29 DIAGNOSIS — Z7984 Long term (current) use of oral hypoglycemic drugs: Secondary | ICD-10-CM | POA: Diagnosis not present

## 2015-06-29 DIAGNOSIS — M109 Gout, unspecified: Secondary | ICD-10-CM | POA: Diagnosis not present

## 2015-06-29 DIAGNOSIS — E8770 Fluid overload, unspecified: Secondary | ICD-10-CM

## 2015-06-29 DIAGNOSIS — F419 Anxiety disorder, unspecified: Secondary | ICD-10-CM | POA: Diagnosis not present

## 2015-06-29 DIAGNOSIS — J90 Pleural effusion, not elsewhere classified: Secondary | ICD-10-CM | POA: Diagnosis not present

## 2015-06-29 DIAGNOSIS — J449 Chronic obstructive pulmonary disease, unspecified: Secondary | ICD-10-CM | POA: Diagnosis not present

## 2015-06-29 DIAGNOSIS — R079 Chest pain, unspecified: Secondary | ICD-10-CM | POA: Diagnosis not present

## 2015-06-29 DIAGNOSIS — I272 Other secondary pulmonary hypertension: Secondary | ICD-10-CM | POA: Diagnosis not present

## 2015-06-29 DIAGNOSIS — K7460 Unspecified cirrhosis of liver: Secondary | ICD-10-CM | POA: Diagnosis not present

## 2015-06-29 DIAGNOSIS — E119 Type 2 diabetes mellitus without complications: Secondary | ICD-10-CM

## 2015-06-29 DIAGNOSIS — N179 Acute kidney failure, unspecified: Secondary | ICD-10-CM | POA: Diagnosis not present

## 2015-06-29 DIAGNOSIS — I5033 Acute on chronic diastolic (congestive) heart failure: Secondary | ICD-10-CM | POA: Diagnosis not present

## 2015-06-29 DIAGNOSIS — Z888 Allergy status to other drugs, medicaments and biological substances status: Secondary | ICD-10-CM | POA: Diagnosis not present

## 2015-06-29 DIAGNOSIS — Z7901 Long term (current) use of anticoagulants: Secondary | ICD-10-CM | POA: Diagnosis not present

## 2015-06-29 DIAGNOSIS — R0602 Shortness of breath: Secondary | ICD-10-CM | POA: Diagnosis not present

## 2015-06-29 DIAGNOSIS — Z6832 Body mass index (BMI) 32.0-32.9, adult: Secondary | ICD-10-CM | POA: Diagnosis not present

## 2015-06-29 DIAGNOSIS — I350 Nonrheumatic aortic (valve) stenosis: Secondary | ICD-10-CM | POA: Diagnosis not present

## 2015-06-29 DIAGNOSIS — E1140 Type 2 diabetes mellitus with diabetic neuropathy, unspecified: Secondary | ICD-10-CM | POA: Diagnosis not present

## 2015-06-29 DIAGNOSIS — Z7951 Long term (current) use of inhaled steroids: Secondary | ICD-10-CM | POA: Diagnosis not present

## 2015-06-29 DIAGNOSIS — F319 Bipolar disorder, unspecified: Secondary | ICD-10-CM | POA: Diagnosis not present

## 2015-06-29 DIAGNOSIS — Z66 Do not resuscitate: Secondary | ICD-10-CM | POA: Diagnosis not present

## 2015-06-29 DIAGNOSIS — Z79899 Other long term (current) drug therapy: Secondary | ICD-10-CM | POA: Diagnosis not present

## 2015-06-29 LAB — ECHOCARDIOGRAM COMPLETE
Height: 73 in
WEIGHTICAEL: 3843.2 [oz_av]

## 2015-06-29 LAB — PROCALCITONIN

## 2015-06-29 LAB — TROPONIN I
TROPONIN I: 0.03 ng/mL (ref <0.031)
Troponin I: 0.04 ng/mL — ABNORMAL HIGH (ref <0.031)
Troponin I: <0.03 ng/mL (ref <0.031)

## 2015-06-29 LAB — LIPID PANEL
Cholesterol: 102 mg/dL (ref 0–200)
HDL: 31 mg/dL — ABNORMAL LOW (ref >40)
LDL CALC: 49 mg/dL (ref 0–99)
Total CHOL/HDL Ratio: 3.3 RATIO
Triglycerides: 108 mg/dL (ref <150)
VLDL: 22 mg/dL (ref 0–40)

## 2015-06-29 LAB — GLUCOSE, CAPILLARY
GLUCOSE-CAPILLARY: 395 mg/dL — AB (ref 65–99)
Glucose-Capillary: 183 mg/dL — ABNORMAL HIGH (ref 65–99)
Glucose-Capillary: 302 mg/dL — ABNORMAL HIGH (ref 65–99)
Glucose-Capillary: 366 mg/dL — ABNORMAL HIGH (ref 65–99)
Glucose-Capillary: 458 mg/dL — ABNORMAL HIGH (ref 65–99)

## 2015-06-29 LAB — I-STAT CG4 LACTIC ACID, ED: Lactic Acid, Venous: 1.23 mmol/L (ref 0.5–2.0)

## 2015-06-29 LAB — IRON AND TIBC
Iron: 25 ug/dL — ABNORMAL LOW (ref 45–182)
SATURATION RATIOS: 6 % — AB (ref 17.9–39.5)
TIBC: 417 ug/dL (ref 250–450)
UIBC: 392 ug/dL

## 2015-06-29 LAB — CREATININE, URINE, RANDOM: CREATININE, URINE: 37.02 mg/dL

## 2015-06-29 LAB — OCCULT BLOOD X 1 CARD TO LAB, STOOL: FECAL OCCULT BLD: POSITIVE — AB

## 2015-06-29 LAB — PROTIME-INR
INR: 2.06 — ABNORMAL HIGH (ref 0.00–1.49)
PROTHROMBIN TIME: 23.1 s — AB (ref 11.6–15.2)

## 2015-06-29 LAB — FERRITIN: Ferritin: 10 ng/mL — ABNORMAL LOW (ref 24–336)

## 2015-06-29 LAB — RETICULOCYTES
RBC.: 3.51 MIL/uL — AB (ref 4.22–5.81)
Retic Count, Absolute: 52.7 10*3/uL (ref 19.0–186.0)
Retic Ct Pct: 1.5 % (ref 0.4–3.1)

## 2015-06-29 LAB — FOLATE: FOLATE: 25 ng/mL (ref >5.9)

## 2015-06-29 LAB — VITAMIN B12: VITAMIN B 12: 125 pg/mL — AB (ref 180–914)

## 2015-06-29 LAB — SODIUM, URINE, RANDOM: SODIUM UR: 89 mmol/L

## 2015-06-29 MED ORDER — GUAIFENESIN ER 600 MG PO TB12
600.0000 mg | ORAL_TABLET | Freq: Two times a day (BID) | ORAL | Status: DC
  Administered 2015-06-29 – 2015-06-30 (×3): 600 mg via ORAL
  Filled 2015-06-29 (×3): qty 1

## 2015-06-29 MED ORDER — LEVALBUTEROL HCL 0.63 MG/3ML IN NEBU
0.6300 mg | INHALATION_SOLUTION | Freq: Four times a day (QID) | RESPIRATORY_TRACT | Status: DC | PRN
Start: 2015-06-29 — End: 2015-06-30

## 2015-06-29 MED ORDER — DOXYCYCLINE HYCLATE 100 MG PO TABS
100.0000 mg | ORAL_TABLET | Freq: Two times a day (BID) | ORAL | Status: DC
  Administered 2015-06-29 (×2): 100 mg via ORAL
  Filled 2015-06-29: qty 1

## 2015-06-29 MED ORDER — INSULIN ASPART 100 UNIT/ML ~~LOC~~ SOLN
20.0000 [IU] | Freq: Once | SUBCUTANEOUS | Status: AC
  Administered 2015-06-29: 20 [IU] via SUBCUTANEOUS

## 2015-06-29 MED ORDER — FUROSEMIDE 10 MG/ML IJ SOLN: 40.0000 mg | Freq: Two times a day (BID) | INTRAMUSCULAR | Status: DC

## 2015-06-29 MED ORDER — TEMAZEPAM 15 MG PO CAPS
15.0000 mg | ORAL_CAPSULE | Freq: Every day | ORAL | Status: DC
  Administered 2015-06-29: 15 mg via ORAL
  Filled 2015-06-29: qty 1

## 2015-06-29 MED ORDER — SODIUM CHLORIDE 0.9 % IV SOLN: 250.0000 mL | INTRAVENOUS | Status: DC | PRN

## 2015-06-29 MED ORDER — INSULIN ASPART 100 UNIT/ML ~~LOC~~ SOLN
0.0000 [IU] | Freq: Three times a day (TID) | SUBCUTANEOUS | Status: DC
  Administered 2015-06-29: 15 [IU] via SUBCUTANEOUS
  Administered 2015-06-29: 3 [IU] via SUBCUTANEOUS

## 2015-06-29 MED ORDER — ZOLPIDEM TARTRATE 5 MG PO TABS: 5.0000 mg | ORAL_TABLET | Freq: Every evening | ORAL | Status: DC | PRN

## 2015-06-29 MED ORDER — ACETAMINOPHEN 325 MG PO TABS: 650.0000 mg | ORAL_TABLET | ORAL | Status: DC | PRN

## 2015-06-29 MED ORDER — INSULIN ASPART 100 UNIT/ML ~~LOC~~ SOLN: 0.0000 [IU] | Freq: Every day | SUBCUTANEOUS | Status: DC

## 2015-06-29 MED ORDER — FLUTICASONE PROPIONATE 50 MCG/ACT NA SUSP
1.0000 | Freq: Two times a day (BID) | NASAL | Status: DC
  Administered 2015-06-29 – 2015-06-30 (×3): 1 via NASAL
  Filled 2015-06-29: qty 16

## 2015-06-29 MED ORDER — IPRATROPIUM BROMIDE 0.02 % IN SOLN: 0.5000 mg | Freq: Four times a day (QID) | RESPIRATORY_TRACT | Status: DC

## 2015-06-29 MED ORDER — SODIUM CHLORIDE 0.9% FLUSH: 3.0000 mL | Freq: Two times a day (BID) | INTRAVENOUS | Status: DC

## 2015-06-29 MED ORDER — SODIUM CHLORIDE 0.9% FLUSH: 3.0000 mL | INTRAVENOUS | Status: DC | PRN

## 2015-06-29 MED ORDER — ALLOPURINOL 100 MG PO TABS
100.0000 mg | ORAL_TABLET | Freq: Every day | ORAL | Status: DC
  Administered 2015-06-29 – 2015-06-30 (×2): 100 mg via ORAL
  Filled 2015-06-29 (×2): qty 1

## 2015-06-29 MED ORDER — DILTIAZEM HCL ER 120 MG PO CP24
120.0000 mg | ORAL_CAPSULE | Freq: Every day | ORAL | Status: DC
  Administered 2015-06-29: 120 mg via ORAL
  Filled 2015-06-29 (×3): qty 1

## 2015-06-29 MED ORDER — INSULIN ASPART 100 UNIT/ML ~~LOC~~ SOLN
0.0000 [IU] | SUBCUTANEOUS | Status: DC
  Administered 2015-06-29: 9 [IU] via SUBCUTANEOUS
  Administered 2015-06-30: 2 [IU] via SUBCUTANEOUS
  Administered 2015-06-30: 3 [IU] via SUBCUTANEOUS
  Administered 2015-06-30: 7 [IU] via SUBCUTANEOUS

## 2015-06-29 MED ORDER — FUROSEMIDE 40 MG PO TABS
80.0000 mg | ORAL_TABLET | Freq: Every day | ORAL | Status: DC
  Administered 2015-06-29: 80 mg via ORAL
  Filled 2015-06-29: qty 2

## 2015-06-29 MED ORDER — METHYLPREDNISOLONE SODIUM SUCC 125 MG IJ SOLR
60.0000 mg | Freq: Every day | INTRAMUSCULAR | Status: DC
  Administered 2015-06-29: 60 mg via INTRAVENOUS
  Filled 2015-06-29: qty 2

## 2015-06-29 MED ORDER — LEVALBUTEROL HCL 0.63 MG/3ML IN NEBU: 0.6300 mg | INHALATION_SOLUTION | RESPIRATORY_TRACT | Status: DC | PRN

## 2015-06-29 MED ORDER — CITALOPRAM HYDROBROMIDE 40 MG PO TABS
40.0000 mg | ORAL_TABLET | Freq: Every morning | ORAL | Status: DC
  Administered 2015-06-29 – 2015-06-30 (×2): 40 mg via ORAL
  Filled 2015-06-29 (×2): qty 1

## 2015-06-29 MED ORDER — MOMETASONE FURO-FORMOTEROL FUM 200-5 MCG/ACT IN AERO
2.0000 | INHALATION_SPRAY | Freq: Two times a day (BID) | RESPIRATORY_TRACT | Status: DC
Start: 2015-06-29 — End: 2015-06-30
  Administered 2015-06-29 – 2015-06-30 (×3): 2 via RESPIRATORY_TRACT
  Filled 2015-06-29: qty 8.8

## 2015-06-29 MED ORDER — ONDANSETRON HCL 4 MG/2ML IJ SOLN: 4.0000 mg | Freq: Four times a day (QID) | INTRAMUSCULAR | Status: DC | PRN

## 2015-06-29 MED ORDER — POLYSACCHARIDE IRON COMPLEX 150 MG PO CAPS
150.0000 mg | ORAL_CAPSULE | Freq: Two times a day (BID) | ORAL | Status: DC
  Administered 2015-06-29 – 2015-06-30 (×3): 150 mg via ORAL
  Filled 2015-06-29 (×4): qty 1

## 2015-06-29 MED ORDER — SENNOSIDES-DOCUSATE SODIUM 8.6-50 MG PO TABS
1.0000 | ORAL_TABLET | Freq: Every day | ORAL | Status: DC
  Administered 2015-06-29: 1 via ORAL
  Filled 2015-06-29 (×2): qty 1

## 2015-06-29 MED ORDER — WARFARIN SODIUM 5 MG PO TABS
5.0000 mg | ORAL_TABLET | Freq: Once | ORAL | Status: AC
  Administered 2015-06-29: 5 mg via ORAL
  Filled 2015-06-29: qty 1

## 2015-06-29 MED ORDER — PRAVASTATIN SODIUM 20 MG PO TABS
20.0000 mg | ORAL_TABLET | Freq: Every day | ORAL | Status: DC
  Administered 2015-06-29: 20 mg via ORAL
  Filled 2015-06-29: qty 1

## 2015-06-29 MED ORDER — WARFARIN - PHARMACIST DOSING INPATIENT: Freq: Every day | Status: DC

## 2015-06-29 MED ORDER — ALPRAZOLAM 0.25 MG PO TABS
0.2500 mg | ORAL_TABLET | Freq: Two times a day (BID) | ORAL | Status: DC | PRN
  Administered 2015-06-29: 0.25 mg via ORAL
  Filled 2015-06-29: qty 1

## 2015-06-29 MED ORDER — IPRATROPIUM BROMIDE 0.02 % IN SOLN
0.5000 mg | Freq: Three times a day (TID) | RESPIRATORY_TRACT | Status: DC
  Administered 2015-06-29 – 2015-06-30 (×3): 0.5 mg via RESPIRATORY_TRACT
  Filled 2015-06-29 (×4): qty 2.5

## 2015-06-29 MED ORDER — LEVALBUTEROL HCL 0.63 MG/3ML IN NEBU
0.6300 mg | INHALATION_SOLUTION | Freq: Three times a day (TID) | RESPIRATORY_TRACT | Status: DC
  Administered 2015-06-29 – 2015-06-30 (×3): 0.63 mg via RESPIRATORY_TRACT
  Filled 2015-06-29 (×4): qty 3

## 2015-06-29 MED ORDER — POLYETHYLENE GLYCOL 3350 17 G PO PACK
17.0000 g | PACK | Freq: Two times a day (BID) | ORAL | Status: DC
  Administered 2015-06-29 – 2015-06-30 (×3): 17 g via ORAL
  Filled 2015-06-29 (×3): qty 1

## 2015-06-29 MED ORDER — POTASSIUM CHLORIDE CRYS ER 20 MEQ PO TBCR: 20.0000 meq | EXTENDED_RELEASE_TABLET | Freq: Every day | ORAL | Status: DC

## 2015-06-29 NOTE — Progress Notes (Signed)
CBG to 458 at 1730. MD paged, ordered 20 u insulin, re-check after 2 hrs. Then, check CBG and give sliding scale q 4 Hr until CBG < 250; then, resume achs orders

## 2015-06-29 NOTE — H&P (Signed)
Joel [name] [id number] DOB: 1932-11-06 DOA: 06/28/2015     PCP: Wenda Low, MD   Outpatient Specialists: Cardiology Hochrein, Pulmonology Young   Patient coming from:   home Lives alone,      Chief Complaint: Dyspnea  HPI: Joel [name] is a 82 y.o. male with medical history significant of DM2, atrial fibrillation status post Maze procedure on chronic anticoagulation, aortic stenosis, mild primary hypertension, chronic bronchitis/COPD,  Obesity hypoventilation syndrome on BiPAP at night  Presented with 3-4day history of shortness of breath lightheadedness and chest pains. He's been having worsening insomnia and was given prescription by his PCP for sleeping pills a haven't helped his been having a lot of nausea. Patient has chronic shortness of breath has been getting worse lately. Today he has been coughing Some and  he has been getting more and more nauseous. Ports chest pain but is more of a soreness after he's been coughing too much. reports his abdomen is distended. He has had increased cough and wheezing, denies sick contacts, no fever.  Reports thick sputum.  Of note patient reported that he used to have history of alcohol abuse but stopped after he was diagnosed with cirrhosis in 1992   Regarding pertinent Chronic problems: Patient has known history of chronic lung disease COPD Has history of atrial fibrillation followed by cardiology put multiple DCCV which has failed and he continued to be intermittently atrial fibrillation he is currently on Coumadin CHA2DS2 - VASc score of 4  Last echogram in 2014 showed pulmonary hypertension but preserved EF as well as aortic stenosis mild to moderate Hx of OSA on BIPAP at night with 2 L of oxygen  IN ER: Afebrile heart rate 71 blood pressure  124/ 53 satting 93% room air Creatinine was noted to be slightly up from baseline of 1.4 up to 1.61 lactic acid 1.2 hemoglobin 9.9 itches stable Chest x-ray was worrisome for mild  congestive heart failure is trace bilateral pleural effusions BNP 57 below prior which was 100, Pro calcitonin level less than 0.1 Given unclear picture patient was given Lasix 80 mg IV doing that and Levaquin in emergency department  Hospitalist was called for admission for dyspnea secondary to CHF versus atypical pneumonia, vs mild COPD exacerbation  Review of Systems:    Pertinent positives include:  shortness of breath at rest, dyspnea on exertion, productive cough, nausea,  Constitutional:  No weight loss, night sweats, Fevers, chills, fatigue, weight loss  HEENT:  No headaches, Difficulty swallowing,Tooth/dental problems,Sore throat,  No sneezing, itching, ear ache, nasal congestion, post nasal drip,  Cardio-vascular:  No chest pain, Orthopnea, PND, anasarca, dizziness, palpitations.no Bilateral lower extremity swelling  GI:  No heartburn, indigestion, abdominal pain,  vomiting, diarrhea, change in bowel habits, loss of appetite, melena, blood in stool, hematemesis Resp:    No excess mucus, no  No non-productive cough, No coughing up of blood.No change in color of mucus.No wheezing. Skin:  no rash or lesions. No jaundice GU:  no dysuria, change in color of urine, no urgency or frequency. No straining to urinate.  No flank pain.  Musculoskeletal:  No joint pain or no joint swelling. No decreased range of motion. No back pain.  Psych:  No change in mood or affect. No depression or anxiety. No memory loss.  Neuro: no localizing neurological complaints, no tingling, no weakness, no double vision, no gait abnormality, no slurred speech, no confusion  As per HPI otherwise 10 point review of systems negative.  Past Medical History: Past Medical History  Diagnosis Date  . COPD (chronic obstructive pulmonary disease) ([place])   . Atrial fibrillation ([place]) 1991    with multiple DCCV  . Type 2 diabetes mellitus ([place])   . Bipolar affective disorder ([place])   . Vertigo   . Anemia   .  Gout   . Fatigue     last year or so  . OSA (obstructive sleep apnea)     severe, uses bipap 16 1/2 by 12 or 13 setting  . On home oxygen therapy 2 1/2 liters with bipap at night  . Hypertension   . Dyslipidemia   . Anxiety   . Insomnia   . Allergic rhinitis   . Obesity   . Diabetic neuropathy ([place])     in feet  . Aortic stenosis 2012    mild to mod    Past Surgical History  Procedure Laterality Date  . Maze  1998  . Knee surgery  1970's    rt  . Shoulder surgery  7-8 yrs ago    rt  . Cardioversion  yrs ago  . Electro shock  1969    for depression  . Colonoscopy with propofol N/A 01/23/2013    Procedure: COLONOSCOPY WITH PROPOFOL;  Surgeon: Garlan Fair, MD;  Location: [facility];  Service: Endoscopy;  Laterality: N/A;  . Esophagogastroduodenoscopy (egd) with propofol N/A 01/23/2013    Procedure: ESOPHAGOGASTRODUODENOSCOPY (EGD) WITH PROPOFOL;  Surgeon: Garlan Fair, MD;  Location: [facility];  Service: Endoscopy;  Laterality: N/A;     Social History:  Ambulatory  independently      reports that he quit smoking about 39 years ago. His smoking use included Cigarettes. He has a 50 pack-year smoking history. He has never used smokeless tobacco. He reports that he does not drink alcohol or use illicit drugs.  Allergies:   Allergies  Allergen Reactions  . Antihistamines, Loratadine-Type     depression  . Meclizine     depression  . Other     Beta blockers-Novacaine depression       Family History:  Family History  Problem Relation Age of Onset  . Tuberculosis Maternal Grandmother   . Heart attack Neg Hx     Medications: Prior to Admission medications   Medication Sig Start Date End Date Taking? Authorizing Provider  allopurinol (ZYLOPRIM) 100 MG tablet Take 100 mg by mouth daily.    Yes Historical Provider, MD  citalopram (CELEXA) 20 MG tablet Take 1 tablet (20 mg total) by mouth every morning. Patient taking differently: Take 40 mg by mouth  every morning.  10/23/14  Yes Eugenie Filler, MD  diltiazem (DILACOR XR) 120 MG 24 hr capsule Take 120 mg by mouth at bedtime.   Yes Historical Provider, MD  fluticasone (FLONASE) 50 MCG/ACT nasal spray Place 1 spray into both nostrils 2 (two) times daily.   Yes Historical Provider, MD  furosemide (LASIX) 40 MG tablet Take 80 mg by mouth daily.    Yes Historical Provider, MD  iron polysaccharides (NIFEREX) 150 MG capsule Take 1 capsule (150 mg total) by mouth 2 (two) times daily. 10/23/14  Yes Eugenie Filler, MD  JANUVIA 100 MG tablet Take 1 tablet by mouth daily. 06/15/15  Yes Historical Provider, MD  lovastatin (MEVACOR) 20 MG tablet Take 20 mg by mouth every morning.    Yes Historical Provider, MD  metFORMIN (GLUCOPHAGE) 1000 MG tablet Take 1,000 mg by mouth 2 (two) times daily.  05/17/14  Yes Historical Provider, MD  polyethylene glycol (MIRALAX / GLYCOLAX) packet Take 17 g by mouth 2 (two) times daily. 10/23/14  Yes Eugenie Filler, MD  potassium chloride SA (K-DUR,KLOR-CON) 20 MEQ tablet Take 1 tablet by mouth daily. 09/10/14  Yes Historical Provider, MD  senna-docusate (SENOKOT-S) 8.6-50 MG per tablet Take 1 tablet by mouth at bedtime. 10/23/14  Yes Eugenie Filler, MD  temazepam (RESTORIL) 15 MG capsule Take 1 capsule by mouth at bedtime.  04/07/14  Yes Historical Provider, MD  valsartan (DIOVAN) 160 MG tablet Take 160 mg by mouth every morning.    Yes Historical Provider, MD  warfarin (COUMADIN) 5 MG tablet Take 5 mg by mouth every evening.    Yes Historical Provider, MD  Tiotropium Bromide-Olodaterol (STIOLTO RESPIMAT) 2.5-2.5 MCG/ACT AERS Inhale 2 puffs into the lungs daily. 04/19/14   Deneise Lever, MD    Physical Exam: Patient Vitals for the past 24 hrs:  BP Temp Temp src Pulse Resp SpO2  06/29/15 0030 (!) 124/53 mmHg - - 71 18 100 %  06/29/15 0025 - - - - - 96 %  06/29/15 0000 122/72 mmHg - - 79 19 93 %  06/28/15 2347 126/66 mmHg - - 76 18 95 %  06/28/15 2230 121/70 mmHg - - 79  24 98 %  06/28/15 2219 145/65 mmHg 98.1 F (36.7 C) Oral 76 18 99 %    1. General:  in No Acute distress 2. Psychological: Alert and   Oriented 3. Head/ENT:   Moist  Mucous Membranes                          Head Non traumatic, neck supple                          Normal  Dentition 4. SKIN:   decreased Skin turgor,  Skin clean Dry and intact no rash 5. Heart: Regular rate and rhythm   Murmur, Rub or gallop 6. Lungs:  some wheezes or crackles   7. Abdomen: Soft, non-tender, Non distended 8. Lower extremities: no clubbing, cyanosis, or edema 9. Neurologically Grossly intact, moving all 4 extremities equally 10. MSK: Normal range of motion   body mass index is unknown because there is no weight on file.  Labs on Admission:   Labs on Admission: I have personally reviewed following labs and imaging studies  CBC:  Recent Labs Lab 06/28/15 2227  WBC 6.1  HGB 9.9*  HCT 31.4*  MCV 89.5  PLT 0000000   Basic Metabolic Panel:  Recent Labs Lab 06/28/15 2227  NA 137  K 4.0  CL 101  CO2 28  GLUCOSE 120*  BUN 21*  CREATININE 1.61*  CALCIUM 9.1   GFR: CrCl cannot be calculated (Unknown ideal weight.). Liver Function Tests:  Recent Labs Lab 06/28/15 2227  AST 19  ALT 11*  ALKPHOS 71  BILITOT 0.6  PROT 7.8  ALBUMIN 4.2   No results for input(s): LIPASE, AMYLASE in the last 168 hours. No results for input(s): AMMONIA in the last 168 hours. Coagulation Profile:  Recent Labs Lab 06/28/15 2227  INR 2.03*   Cardiac Enzymes: No results for input(s): CKTOTAL, CKMB, CKMBINDEX, TROPONINI in the last 168 hours. BNP (last 3 results) No results for input(s): PROBNP in the last 8760 hours. HbA1C: No results for input(s): HGBA1C in the last 72 hours. CBG: No results for input(s): GLUCAP in  the last 168 hours. Lipid Profile: No results for input(s): CHOL, HDL, LDLCALC, TRIG, CHOLHDL, LDLDIRECT in the last 72 hours. Thyroid Function Tests: No results for input(s): TSH,  T4TOTAL, FREET4, T3FREE, THYROIDAB in the last 72 hours. Anemia Panel: No results for input(s): VITAMINB12, FOLATE, FERRITIN, TIBC, IRON, RETICCTPCT in the last 72 hours. Urine analysis:    Component Value Date/Time   COLORURINE YELLOW 10/22/2014 2136   APPEARANCEUR CLEAR 10/22/2014 2136   LABSPEC 1.023 10/22/2014 2136   PHURINE 5.5 10/22/2014 2136   GLUCOSEU NEGATIVE 10/22/2014 2136   HGBUR NEGATIVE 10/22/2014 2136   BILIRUBINUR NEGATIVE 10/22/2014 2136   KETONESUR NEGATIVE 10/22/2014 2136   PROTEINUR NEGATIVE 10/22/2014 2136   UROBILINOGEN 0.2 10/22/2014 2136   NITRITE NEGATIVE 10/22/2014 2136   LEUKOCYTESUR NEGATIVE 10/22/2014 2136   Sepsis Labs: @LABRCNTIP (procalcitonin:4,lacticidven:4) )No results found for this or any previous visit (from the past 240 hour(s)).       UA not obtained  Lab Results  Component Value Date   HGBA1C 6.6* 08/04/2013    CrCl cannot be calculated (Unknown ideal weight.).  BNP (last 3 results) No results for input(s): PROBNP in the last 8760 hours.   ECG REPORT  Independently reviewed Rate: 80  Rhythm: a.fib ST&T Change: No acute ischemic changes   QTC 460  There were no vitals filed for this visit.   Cultures: No results found for: SDES, SPECREQUEST, CULT, REPTSTATUS   Radiological Exams on Admission: Dg Chest 2 View  06/28/2015  CLINICAL DATA:  Chest pain and shortness of breath EXAM: CHEST  2 VIEW COMPARISON:  10/22/2014 chest radiograph. FINDINGS: Sternotomy wires appear aligned and intact. Stable cardiomediastinal silhouette with mild cardiomegaly. No pneumothorax. Trace bilateral pleural effusions. Mild pulmonary edema. IMPRESSION: 1. Mild congestive heart failure. 2. Trace bilateral pleural effusions. Electronically Signed   By: Ilona Sorrel M.D.   On: 06/28/2015 23:20    Chart has been reviewed    Assessment/Plan  82 y.o. male with medical history significant of DM2, atrial fibrillation status post Maze procedure on  chronic anticoagulation, aortic stenosis, mild primary hypertension, chronic bronchitis/COPD,  Obesity hypoventilation syndrome on BiPAP at night being admitted for  dyspnea secondary to CHF versus atypical pneumonia, vs mild COPD exacerbation    Present on Admission: Dyspnea most likely multifactorial combination of COPD exacerbation and some fluid overload atypical infectious process could not be ruled out at this time patient on Coumadin making PE less likely, Given left lower extremity edema is symmetric obtain Dopplers  . COPD (chronic obstructive pulmonary disease) with acute bronchitis ([place]) - - Will initiate Steroid taper, antibiotics, Albuterol PRN, scheduled Atrovent, Dulera and Mucinex. Titrate O2 to saturation >90%. Follow patients respiratory status.  . Pulmonary hypertension ([place]) repeat echogram  . Fluid overload - BUN within normal limits abnormal chest x-ray could be also explained by atypical infection. Will obtain echogram cycle cardiac enzymes, monitor on telemetry pending results may need cardiology consult. We will email and to make sure they know he is here . Anemia, iron deficiency - obtain anemia morning's Hemoccult stool  . Atrial fibrillation ([facility]) CHA2DS2 - VASc score of 4 continue Coumadin and rate control  . Obesity hypoventilation syndrome ([facility]) continue BiPAP   Other plan as per orders. DVT prophylaxis:  coumadin  Code Status:  DNR/DNI   as per patient    Family Communication:   Family not  at  Bedside    Disposition Plan:    To home once workup is complete and patient is  stable   Consults called: emailed cardiology  Admission status:   inpatient        Level of care    tele         I have spent a total of 54 min on this admission    Marlene Beidler 06/29/2015, 2:51 AM    [facility]  Pager [phone]   after 2 AM please page floor coverage PA If 7AM-7PM, please contact the day team taking care of the patient  [email or web address]  Password  [email or web address]

## 2015-06-29 NOTE — Evaluation (Signed)
Physical Therapy Evaluation Patient Details Name: Joel [name] MRN: [id number] DOB: [birth year] Today's Date: 06/29/2015   History of Present Illness  This is a pleasant 82 year old Caucasian male with history of COPD uses home oxygen as needed, moderate AS, obstructive sleep apnea uses nighttime oxygen, iron deficiency anemia, DM type II, A. fib -on Coumadin, who was admitted to the hospital on 06/29/15 for shortness of breath and orthopnea consistent with acute on chronic diastolic CHF, also  Seen by cardiology d/t elevated troponin  Clinical Impression  Pt admitted with above diagnosis. Pt currently with functional limitations due to the deficits listed below (see PT Problem List).  Pt will benefit from skilled PT to increase their independence and safety with mobility to allow discharge to the venue listed below.   Will continue to follow, recommend HHPT vs no f/u PT depending on progress    Follow Up Recommendations Home health PT    Equipment Recommendations  None recommended by PT    Recommendations for Other Services       Precautions / Restrictions Precautions Precautions: Fall Restrictions Weight Bearing Restrictions: No      Mobility  Bed Mobility Overal bed mobility: Needs Assistance Bed Mobility: Supine to Sit     Supine to sit: Min assist     General bed mobility comments: to bring trunk to full upright, incr time, pt states he uses bed post at home to self assist;   Transfers Overall transfer level: Needs assistance Equipment used: Rolling walker (2 wheeled) Transfers: Sit to/from Stand Sit to Stand: Min guard         General transfer comment: cues for safety and hand placement;    Ambulation/Gait Ambulation/Gait assistance: Min guard Ambulation Distance (Feet): 50 Feet Assistive device: Rolling walker (2 wheeled) Gait Pattern/deviations: Step-through pattern;Decreased stride length     General Gait Details: cues for RW distance from self,  posture, breathing; pt mildly dizzy and fatigued after above distance--VSS-O2 sats 100% on RA  Stairs            Wheelchair Mobility    Modified Rankin (Stroke Patients Only)       Balance Overall balance assessment: Needs assistance (pt denies any recent falls)   Sitting balance-Leahy Scale: Good       Standing balance-Leahy Scale: Fair Standing balance comment: briefly able to maintain static standing without UE support                             Pertinent Vitals/Pain Pain Assessment: Faces Faces Pain Scale: Hurts a little bit Pain Location: knees "hurt all the time" Pain Intervention(s): Limited activity within patient's tolerance;Monitored during session    [place] expects to be discharged to:: Private residence Living Arrangements: Alone   Type of Home: House Home Access: Stairs to enter   [employer] of Steps: 1 Home Layout: Multi-level;Able to live on main level with bedroom/bathroom Home Equipment: Gilford Rile - 2 wheels;Cane - single point      Prior Function Level of Independence: Independent;Independent with assistive device(s)         Comments: pt reports he uses canes sometimes (he has them 'everywhere") and also furniture walks     Hand Dominance        Extremity/Trunk Assessment   Upper Extremity Assessment: Defer to OT evaluation           Lower Extremity Assessment: Overall WFL for tasks assessed  Communication   Communication: No difficulties  Cognition Arousal/Alertness: Awake/alert Behavior During Therapy: WFL for tasks assessed/performed Overall Cognitive Status: Within Functional Limits for tasks assessed                      General Comments      Exercises        Assessment/Plan    PT Assessment Patient needs continued PT services  PT Diagnosis Difficulty walking   PT Problem List Decreased mobility;Decreased balance;Decreased activity tolerance  PT  Treatment Interventions DME instruction;Gait training;Functional mobility training;Therapeutic activities;Therapeutic exercise;Patient/family education   PT Goals (Current goals can be found in the Care Plan section) Acute Rehab PT Goals Patient Stated Goal: to get better PT Goal Formulation: With patient Time For Goal Achievement: 07/06/15 Potential to Achieve Goals: Good    Frequency     Barriers to discharge        Co-evaluation               End of Session Equipment Utilized During Treatment: Gait belt Activity Tolerance: Patient tolerated treatment well Patient left: in chair;with call bell/phone within reach;with chair alarm set           Time: 1134-1150 PT Time Calculation (min) (ACUTE ONLY): 16 min   Charges:   PT Evaluation $PT Eval Low Complexity: 1 Procedure     PT G Codes:        Jimma Ortman 22-Jul-2015, 12:22 PM

## 2015-06-29 NOTE — Progress Notes (Signed)
RT placed patient on BIPAP. Patient home setting is an epap of 12 and IPAP of 16.5 cmH2O. Sterile water was added to water chamber for humidification. 2 liters oxygen bleed into tubing. Patient is tolerating well. RT will continue to monitor.

## 2015-06-29 NOTE — Consult Note (Signed)
CONSULTATION NOTE  Reason for Consult: Dyspnea  Requesting Physician: Dr. Candiss Norse  Cardiologist: Dr. Percival Spanish  HPI: This is a 82 y.o. male with a past medical history significant for chronic atrial fibrillation status post Cox maze procedure and the cardioversions in the past. He also has mild to moderate aortic stenosis and chronic lung disease on 2-1/2 L of oxygen with BiPAP at night which she apparently is inconsistently compliant with. His CHADSVASC score is 4 and he is anticoagulated on Coumadin. Last office weight from June 2016 was 260 pounds. He now presents with progressive dyspnea for the past 3-4 days along with lightheadedness and chest pains. ER workup included a BNP of 57 and borderline troponin of 0.02 and 0.04. Chest x-ray shows mild congestive heart failure and trace bilateral pleural effusions. He was given 80 mg of IV Lasix diuresis 1.7 L negative overnight. Creatinine on admission was 1.61. INR was therapeutic at 2.06.  PMHx:  Past Medical History  Diagnosis Date  . COPD (chronic obstructive pulmonary disease) ([place])   . Atrial fibrillation ([place]) 1991    with multiple DCCV  . Type 2 diabetes mellitus ([place])   . Bipolar affective disorder ([place])   . Vertigo   . Anemia   . Gout   . Fatigue     last year or so  . OSA (obstructive sleep apnea)     severe, uses bipap 16 1/2 by 12 or 13 setting  . On home oxygen therapy 2 1/2 liters with bipap at night  . Hypertension   . Dyslipidemia   . Anxiety   . Insomnia   . Allergic rhinitis   . Obesity   . Diabetic neuropathy ([place])     in feet  . Aortic stenosis 2012    mild to mod    Past Surgical History  Procedure Laterality Date  . Maze  1998  . Knee surgery  1970's    rt  . Shoulder surgery  7-8 yrs ago    rt  . Cardioversion  yrs ago  . Electro shock  1969    for depression  . Colonoscopy with propofol N/A 01/23/2013    Procedure: COLONOSCOPY WITH PROPOFOL;  Surgeon: Garlan Fair, MD;  Location: [facility]  ENDOSCOPY;  Service: Endoscopy;  Laterality: N/A;  . Esophagogastroduodenoscopy (egd) with propofol N/A 01/23/2013    Procedure: ESOPHAGOGASTRODUODENOSCOPY (EGD) WITH PROPOFOL;  Surgeon: Garlan Fair, MD;  Location: [facility] ENDOSCOPY;  Service: Endoscopy;  Laterality: N/A;    FAMHx: Family History  Problem Relation Age of Onset  . Tuberculosis Maternal Grandmother   . Heart attack Neg Hx   . Diabetes Neg Hx   . CAD Neg Hx   . Cancer Neg Hx     SOCHx:  reports that he quit smoking about 39 years ago. His smoking use included Cigarettes. He has a 50 pack-year smoking history. He has never used smokeless tobacco. He reports that he does not drink alcohol or use illicit drugs.  ALLERGIES: Allergies  Allergen Reactions  . Antihistamines, Loratadine-Type     depression  . Meclizine     depression  . Other     Beta blockers-Novacaine depression    ROS: Pertinent items noted in HPI and remainder of comprehensive ROS otherwise negative.  HOME MEDICATIONS:   Medication List    ASK your doctor about these medications        allopurinol 100 MG tablet  Commonly known as:  ZYLOPRIM  Take 100 mg by  mouth daily.     citalopram 20 MG tablet  Commonly known as:  CELEXA  Take 1 tablet (20 mg total) by mouth every morning.     diltiazem 120 MG 24 hr capsule  Commonly known as:  DILACOR XR  Take 120 mg by mouth at bedtime.     fluticasone 50 MCG/ACT nasal spray  Commonly known as:  FLONASE  Place 1 spray into both nostrils 2 (two) times daily.     furosemide 40 MG tablet  Commonly known as:  LASIX  Take 80 mg by mouth daily.     iron polysaccharides 150 MG capsule  Commonly known as:  NIFEREX  Take 1 capsule (150 mg total) by mouth 2 (two) times daily.     JANUVIA 100 MG tablet  Generic drug:  sitaGLIPtin  Take 1 tablet by mouth daily.     lovastatin 20 MG tablet  Commonly known as:  MEVACOR  Take 20 mg by mouth every morning.     metFORMIN 1000 MG tablet  Commonly  known as:  GLUCOPHAGE  Take 1,000 mg by mouth 2 (two) times daily.     polyethylene glycol packet  Commonly known as:  MIRALAX / GLYCOLAX  Take 17 g by mouth 2 (two) times daily.     potassium chloride SA 20 MEQ tablet  Commonly known as:  K-DUR,KLOR-CON  Take 1 tablet by mouth daily.     senna-docusate 8.6-50 MG tablet  Commonly known as:  Senokot-S  Take 1 tablet by mouth at bedtime.     temazepam 15 MG capsule  Commonly known as:  RESTORIL  Take 1 capsule by mouth at bedtime.     Tiotropium Bromide-Olodaterol 2.5-2.5 MCG/ACT Aers  Commonly known as:  STIOLTO RESPIMAT  Inhale 2 puffs into the lungs daily.     valsartan 160 MG tablet  Commonly known as:  DIOVAN  Take 160 mg by mouth every morning.     warfarin 5 MG tablet  Commonly known as:  COUMADIN  Take 5 mg by mouth every evening.        HOSPITAL MEDICATIONS: Prior to Admission:  Prescriptions prior to admission  Medication Sig Dispense Refill Last Dose  . allopurinol (ZYLOPRIM) 100 MG tablet Take 100 mg by mouth daily.    06/29/2015 at Unknown time  . citalopram (CELEXA) 20 MG tablet Take 1 tablet (20 mg total) by mouth every morning. (Patient taking differently: Take 40 mg by mouth every morning. ) 30 tablet 0 06/29/2015 at Unknown time  . diltiazem (DILACOR XR) 120 MG 24 hr capsule Take 120 mg by mouth at bedtime.   06/28/2015 at Unknown time  . fluticasone (FLONASE) 50 MCG/ACT nasal spray Place 1 spray into both nostrils 2 (two) times daily.   06/29/2015 at Unknown time  . furosemide (LASIX) 40 MG tablet Take 80 mg by mouth daily.    06/29/2015 at Unknown time  . iron polysaccharides (NIFEREX) 150 MG capsule Take 1 capsule (150 mg total) by mouth 2 (two) times daily. 60 capsule 0 06/29/2015 at Unknown time  . JANUVIA 100 MG tablet Take 1 tablet by mouth daily.  1 06/29/2015 at Unknown time  . lovastatin (MEVACOR) 20 MG tablet Take 20 mg by mouth every morning.    06/28/2015 at Unknown time  . metFORMIN (GLUCOPHAGE)  1000 MG tablet Take 1,000 mg by mouth 2 (two) times daily.   0 06/29/2015 at Unknown time  . polyethylene glycol (MIRALAX / GLYCOLAX) packet Take 17  g by mouth 2 (two) times daily. 14 each 0 06/29/2015 at Unknown time  . potassium chloride SA (K-DUR,KLOR-CON) 20 MEQ tablet Take 1 tablet by mouth daily.  0 06/29/2015 at Unknown time  . senna-docusate (SENOKOT-S) 8.6-50 MG per tablet Take 1 tablet by mouth at bedtime.   06/29/2015 at Unknown time  . temazepam (RESTORIL) 15 MG capsule Take 1 capsule by mouth at bedtime.   0 06/28/2015 at Unknown time  . valsartan (DIOVAN) 160 MG tablet Take 160 mg by mouth every morning.    06/29/2015 at Unknown time  . warfarin (COUMADIN) 5 MG tablet Take 5 mg by mouth every evening.    06/28/2015 at Unknown time  . Tiotropium Bromide-Olodaterol (STIOLTO RESPIMAT) 2.5-2.5 MCG/ACT AERS Inhale 2 puffs into the lungs daily. 1 Inhaler 0 10/21/2014 at Unknown time    VITALS: Blood pressure 137/80, pulse 73, temperature 98.7 F (37.1 C), temperature source Oral, resp. rate 22, height 6' 1"  (1.854 m), weight 240 lb 3.2 oz (108.954 kg), SpO2 100 %.  PHYSICAL EXAM: General appearance: alert and no distress Neck: no carotid bruit and no JVD Lungs: clear to auscultation bilaterally Heart: irregularly irregular rhythm, S1, S2 normal and systolic murmur: late systolic 3/6, crescendo at 2nd left intercostal space Abdomen: soft, non-tender; bowel sounds normal; no masses,  no organomegaly Extremities: extremities normal, atraumatic, no cyanosis or edema Pulses: 2+ and symmetric Skin: Skin color, texture, turgor normal. No rashes or lesions Neurologic: Grossly normal Psych: Pleasant  LABS: Results for orders placed or performed during the hospital encounter of 06/28/15 (from the past 48 hour(s))  Basic metabolic panel     Status: Abnormal   Collection Time: 06/28/15 10:27 PM  Result Value Ref Range   Sodium 137 135 - 145 mmol/L   Potassium 4.0 3.5 - 5.1 mmol/L   Chloride 101  101 - 111 mmol/L   CO2 28 22 - 32 mmol/L   Glucose, Bld 120 (H) 65 - 99 mg/dL   BUN 21 (H) 6 - 20 mg/dL   Creatinine, Ser 1.61 (H) 0.61 - 1.24 mg/dL   Calcium 9.1 8.9 - 10.3 mg/dL   GFR calc non Af Amer 39 (L) >60 mL/min   GFR calc Af Amer 45 (L) >60 mL/min    Comment: (NOTE) The eGFR has been calculated using the CKD EPI equation. This calculation has not been validated in all clinical situations. eGFR's persistently <60 mL/min signify possible Chronic Kidney Disease.    Anion gap 8 5 - 15  CBC     Status: Abnormal   Collection Time: 06/28/15 10:27 PM  Result Value Ref Range   WBC 6.1 4.0 - 10.5 K/uL   RBC 3.51 (L) 4.22 - 5.81 MIL/uL   Hemoglobin 9.9 (L) 13.0 - 17.0 g/dL   HCT 31.4 (L) 39.0 - 52.0 %   MCV 89.5 78.0 - 100.0 fL   MCH 28.2 26.0 - 34.0 pg   MCHC 31.5 30.0 - 36.0 g/dL   RDW 14.3 11.5 - 15.5 %   Platelets 204 150 - 400 K/uL  Protime-INR (order if Patient is taking Coumadin / Warfarin)     Status: Abnormal   Collection Time: 06/28/15 10:27 PM  Result Value Ref Range   Prothrombin Time 22.9 (H) 11.6 - 15.2 seconds   INR 2.03 (H) 0.00 - 1.49  Brain natriuretic peptide     Status: None   Collection Time: 06/28/15 10:27 PM  Result Value Ref Range   B Natriuretic Peptide 57.0 0.0 -  100.0 pg/mL  Hepatic function panel     Status: Abnormal   Collection Time: 06/28/15 10:27 PM  Result Value Ref Range   Total Protein 7.8 6.5 - 8.1 g/dL   Albumin 4.2 3.5 - 5.0 g/dL   AST 19 15 - 41 U/L   ALT 11 (L) 17 - 63 U/L   Alkaline Phosphatase 71 38 - 126 U/L   Total Bilirubin 0.6 0.3 - 1.2 mg/dL   Bilirubin, Direct <0.1 (L) 0.1 - 0.5 mg/dL   Indirect Bilirubin NOT CALCULATED 0.3 - 0.9 mg/dL  I-stat troponin, ED     Status: None   Collection Time: 06/28/15 10:31 PM  Result Value Ref Range   Troponin i, poc 0.02 0.00 - 0.08 ng/mL   Comment 3            Comment: Due to the release kinetics of cTnI, a negative result within the first hours of the onset of symptoms does not  rule out myocardial infarction with certainty. If myocardial infarction is still suspected, repeat the test at appropriate intervals.   Procalcitonin - Baseline     Status: None   Collection Time: 06/29/15 12:48 AM  Result Value Ref Range   Procalcitonin <0.10 ng/mL    Comment:        Interpretation: PCT (Procalcitonin) <= 0.5 ng/mL: Systemic infection (sepsis) is not likely. Local bacterial infection is possible. (NOTE)         ICU PCT Algorithm               Non ICU PCT Algorithm    ----------------------------     ------------------------------         PCT < 0.25 ng/mL                 PCT < 0.1 ng/mL     Stopping of antibiotics            Stopping of antibiotics       strongly encouraged.               strongly encouraged.    ----------------------------     ------------------------------       PCT level decrease by               PCT < 0.25 ng/mL       >= 80% from peak PCT       OR PCT 0.25 - 0.5 ng/mL          Stopping of antibiotics                                             encouraged.     Stopping of antibiotics           encouraged.    ----------------------------     ------------------------------       PCT level decrease by              PCT >= 0.25 ng/mL       < 80% from peak PCT        AND PCT >= 0.5 ng/mL            Continuin g antibiotics  encouraged.       Continuing antibiotics            encouraged.    ----------------------------     ------------------------------     PCT level increase compared          PCT > 0.5 ng/mL         with peak PCT AND          PCT >= 0.5 ng/mL             Escalation of antibiotics                                          strongly encouraged.      Escalation of antibiotics        strongly encouraged.   I-Stat CG4 Lactic Acid, ED     Status: None   Collection Time: 06/29/15 12:57 AM  Result Value Ref Range   Lactic Acid, Venous 1.23 0.5 - 2.0 mmol/L  Troponin I (q 6hr x 3)      Status: Abnormal   Collection Time: 06/29/15 12:57 AM  Result Value Ref Range   Troponin I 0.04 (H) <0.031 ng/mL    Comment:        PERSISTENTLY INCREASED TROPONIN VALUES IN THE RANGE OF 0.04-0.49 ng/mL CAN BE SEEN IN:       -UNSTABLE ANGINA       -CONGESTIVE HEART FAILURE       -MYOCARDITIS       -CHEST TRAUMA       -ARRYHTHMIAS       -LATE PRESENTING MYOCARDIAL INFARCTION       -COPD   CLINICAL FOLLOW-UP RECOMMENDED.   Reticulocytes     Status: Abnormal   Collection Time: 06/29/15  5:34 AM  Result Value Ref Range   Retic Ct Pct 1.5 0.4 - 3.1 %   RBC. 3.51 (L) 4.22 - 5.81 MIL/uL   Retic Count, Manual 52.7 19.0 - 186.0 K/uL  Protime-INR     Status: Abnormal   Collection Time: 06/29/15  5:34 AM  Result Value Ref Range   Prothrombin Time 23.1 (H) 11.6 - 15.2 seconds   INR 2.06 (H) 0.00 - 1.49  Glucose, capillary     Status: Abnormal   Collection Time: 06/29/15  7:39 AM  Result Value Ref Range   Glucose-Capillary 183 (H) 65 - 99 mg/dL    IMAGING: Dg Chest 2 View  06/28/2015  CLINICAL DATA:  Chest pain and shortness of breath EXAM: CHEST  2 VIEW COMPARISON:  10/22/2014 chest radiograph. FINDINGS: Sternotomy wires appear aligned and intact. Stable cardiomediastinal silhouette with mild cardiomegaly. No pneumothorax. Trace bilateral pleural effusions. Mild pulmonary edema. IMPRESSION: 1. Mild congestive heart failure. 2. Trace bilateral pleural effusions. Electronically Signed   By: Ilona Sorrel M.D.   On: 06/28/2015 23:20    HOSPITAL DIAGNOSES: Active Problems:   DM2 (diabetes mellitus, type 2) ([place])   Atrial fibrillation (HCC)   Obesity hypoventilation syndrome (HCC)   Anemia, iron deficiency   COPD (chronic obstructive pulmonary disease) with acute bronchitis (HCC)   Pulmonary hypertension (HCC)   Fluid overload   Acute CHF (HCC)   COPD exacerbation (H[facility])   IMPRESSION: 1. Acute congestive heart failure 2. Probable COPD exacerbation 3. At least moderate to severe  aortic stenosis by exam 4. Rate controlled atrial fibrillation 5. Therapeutic INR on warfarin  RECOMMENDATION:  1. Mr. Abbey presented with progressive dyspnea on exertion. BNP is very low at 57 but could be inaccurate. This is lower than it had been previously. There is a very mild elevation in troponin at 0.04. I would check an additional troponin to rule out acute MI as a cause of his dyspnea. We will get an echocardiogram today. To my ear his aortic stenosis is moderate to possibly severe and seems to progress since his last echocardiogram. He diuresed well with IV Lasix overnight. Breathing is better today. I would restart his home Lasix 80 mg oral today.  Thanks for the consultation.  Time Spent Directly with Patient: 30 minutes  Pixie Casino, MD, [facility] Attending Cardiologist [place] 06/29/2015, 7:59 AM

## 2015-06-29 NOTE — ED Notes (Signed)
Doutova,MD.,Hospitalist at bedside.

## 2015-06-29 NOTE — Progress Notes (Signed)
*  PRELIMINARY RESULTS* Vascular Ultrasound Left lower extremity venous duplex has been completed.  Preliminary findings: No evidence of DVT or baker's cyst.   Landry Mellow, RDMS, RVT  06/29/2015, 2:31 PM

## 2015-06-29 NOTE — Progress Notes (Signed)
*  PRELIMINARY RESULTS* Echocardiogram 2D Echocardiogram has been performed.  [name] 06/29/2015, 9:34 AM

## 2015-06-29 NOTE — Progress Notes (Signed)
ANTICOAGULATION CONSULT NOTE - Initial Consult  Pharmacy Consult for warfarin Indication: atrial fibrillation  Allergies  Allergen Reactions  . Antihistamines, Loratadine-Type     depression  . Meclizine     depression  . Other     Beta blockers-Novacaine depression    Patient Measurements: Height: 6\' 1"  (185.4 cm) Weight: 240 lb 3.2 oz (108.954 kg) IBW/kg (Calculated) : 79.9 Heparin Dosing Weight:   Vital Signs: Temp: 98.7 F (37.1 C) (05/28 0336) Temp Source: Oral (05/28 0336) BP: 137/80 mmHg (05/28 0336) Pulse Rate: 73 (05/28 0336)  Labs:  Recent Labs  06/28/15 2227 06/29/15 0057 06/29/15 0534  HGB 9.9*  --   --   HCT 31.4*  --   --   PLT 204  --   --   LABPROT 22.9*  --  23.1*  INR 2.03*  --  2.06*  CREATININE 1.61*  --   --   TROPONINI  --  0.04*  --     Estimated Creatinine Clearance: 47.4 mL/min (by C-G formula based on Cr of 1.61).   Medical History: Past Medical History  Diagnosis Date  . COPD (chronic obstructive pulmonary disease) ([place])   . Atrial fibrillation ([place]) 1991    with multiple DCCV  . Type 2 diabetes mellitus ([place])   . Bipolar affective disorder ([place])   . Vertigo   . Anemia   . Gout   . Fatigue     last year or so  . OSA (obstructive sleep apnea)     severe, uses bipap 16 1/2 by 12 or 13 setting  . On home oxygen therapy 2 1/2 liters with bipap at night  . Hypertension   . Dyslipidemia   . Anxiety   . Insomnia   . Allergic rhinitis   . Obesity   . Diabetic neuropathy ([place])     in feet  . Aortic stenosis 2012    mild to mod     Medications:  Prescriptions prior to admission  Medication Sig Dispense Refill Last Dose  . allopurinol (ZYLOPRIM) 100 MG tablet Take 100 mg by mouth daily.    06/29/2015 at Unknown time  . citalopram (CELEXA) 20 MG tablet Take 1 tablet (20 mg total) by mouth every morning. (Patient taking differently: Take 40 mg by mouth every morning. ) 30 tablet 0 06/29/2015 at Unknown time  . diltiazem  (DILACOR XR) 120 MG 24 hr capsule Take 120 mg by mouth at bedtime.   06/28/2015 at Unknown time  . fluticasone (FLONASE) 50 MCG/ACT nasal spray Place 1 spray into both nostrils 2 (two) times daily.   06/29/2015 at Unknown time  . furosemide (LASIX) 40 MG tablet Take 80 mg by mouth daily.    06/29/2015 at Unknown time  . iron polysaccharides (NIFEREX) 150 MG capsule Take 1 capsule (150 mg total) by mouth 2 (two) times daily. 60 capsule 0 06/29/2015 at Unknown time  . JANUVIA 100 MG tablet Take 1 tablet by mouth daily.  1 06/29/2015 at Unknown time  . lovastatin (MEVACOR) 20 MG tablet Take 20 mg by mouth every morning.    06/28/2015 at Unknown time  . metFORMIN (GLUCOPHAGE) 1000 MG tablet Take 1,000 mg by mouth 2 (two) times daily.   0 06/29/2015 at Unknown time  . polyethylene glycol (MIRALAX / GLYCOLAX) packet Take 17 g by mouth 2 (two) times daily. 14 each 0 06/29/2015 at Unknown time  . potassium chloride SA (K-DUR,KLOR-CON) 20 MEQ tablet Take 1 tablet by mouth daily.  0 06/29/2015 at Unknown time  . senna-docusate (SENOKOT-S) 8.6-50 MG per tablet Take 1 tablet by mouth at bedtime.   06/29/2015 at Unknown time  . temazepam (RESTORIL) 15 MG capsule Take 1 capsule by mouth at bedtime.   0 06/28/2015 at Unknown time  . valsartan (DIOVAN) 160 MG tablet Take 160 mg by mouth every morning.    06/29/2015 at Unknown time  . warfarin (COUMADIN) 5 MG tablet Take 5 mg by mouth every evening.    06/28/2015 at Unknown time  . Tiotropium Bromide-Olodaterol (STIOLTO RESPIMAT) 2.5-2.5 MCG/ACT AERS Inhale 2 puffs into the lungs daily. 1 Inhaler 0 10/21/2014 at Unknown time   Scheduled:  . allopurinol  100 mg Oral Daily  . citalopram  40 mg Oral q morning - 10a  . diltiazem  120 mg Oral QHS  . doxycycline  100 mg Oral Q12H  . fluticasone  1 spray Each Nare BID  . furosemide  40 mg Intravenous BID  . guaiFENesin  600 mg Oral BID  . insulin aspart  0-15 Units Subcutaneous TID WC  . insulin aspart  0-5 Units Subcutaneous QHS   . ipratropium  0.5 mg Nebulization TID  . iron polysaccharides  150 mg Oral BID  . levalbuterol  0.63 mg Nebulization TID  . methylPREDNISolone (SOLU-MEDROL) injection  60 mg Intravenous Daily  . mometasone-formoterol  2 puff Inhalation BID  . polyethylene glycol  17 g Oral BID  . potassium chloride SA  20 mEq Oral Daily  . pravastatin  20 mg Oral q1800  . senna-docusate  1 tablet Oral QHS  . sodium chloride flush  3 mL Intravenous Q12H  . temazepam  15 mg Oral QHS  . Warfarin - Pharmacist Dosing Inpatient   Does not apply q1800    Assessment: Patient on chronic warfarin for afib.  INR on admit and 5/28 AM at goal (2-3)  Last dose warfarin noted on 5/27.  Goal of Therapy:  INR 2-3    Plan:  Warfarin 5mg  po x1 at 1800 Daily INR  Tyler Deis, Shea Stakes [place] 06/29/2015,6:10 AM

## 2015-06-29 NOTE — Progress Notes (Addendum)
PROGRESS NOTE                                                                                                                                                                                                             Patient Demographics:    Joel [name], is a 82 y.o. male, DOB - 30-Mar-1932, [id number]  Admit date - 06/28/2015   Admitting Physician Toy Baker, MD  Outpatient Primary MD for the patient is Wenda Low, MD  LOS - 0  Chief Complaint  Patient presents with  . Shortness of Breath  . Chest Pain  . Dizziness       Brief Narrative  This is a pleasant 82 year old Caucasian male with history of COPD uses home oxygen as needed, moderate AS, obstructive sleep apnea uses nighttime oxygen, iron deficiency anemia, DM type II, A. fib status post Maze procedure on Coumadin, who was admitted to the hospital on 06/29/15 for shortness of breath and orthopnea consistent with acute on chronic diastolic CHF. Seen by cardiology. Much improved after diuresis with Lasix IV.   Subjective:    [name] today has,  No headache, No chest pain, No abdominal pain - No Nausea, No new weakness tingling or numbness, No Cough - Which improved shortness of breath.   Assessment  & Plan :     1.Acute on chronic hypoxic respiratory failure due to acute on chronic diastolic CHF. Repeat echo pending, on previous echo EF not mentioned.  He definitely had orthopnea, this morning he is much improved after 2 L diuresis from IV Lasix, he is back to baseline and is not requiring oxygen, do not think this is COPD exacerbation. Stop IV steroids and doxycycline. He has no wheezing on exam. Increase activity. Replaced on oral Lasix. Cardiology following. Continue salt and fluid restriction along with daily weights.   2. COPD on home oxygen as needed. At baseline. Continue supportive care. Stop steroids and doxycycline.  3. Chronic  atrial fibrillation status post Maze procedure, [place] vasc 2 score of greater than 4. Rate control will be the goal, continue Cardizem and Coumadin. Cardiology following.  4. Dyslipidemia. Continue home dose statin.  5. Iron deficiency anemia. Continue oral iron supplementation.  6. OSA. Oxygen at night. Home BiPAP if he agrees to use.  7. Gout. Continue allopurinol.   8. History of anxiety. Home medications  continued.   9. Mod AS - cards following, TTE pending.  10. ARF - due to CHF, diuresed, lower lasix to PO per Cards and monitor.  11. DM type II. Sliding scale insulin for now.  Lab Results  Component Value Date   HGBA1C 6.6* 08/04/2013   CBG (last 3)   Recent Labs  06/29/15 0739  GLUCAP 183*       Code Status : DO NOT RESUSCITATE  Family Communication  :  None present  Disposition Plan  :  Stay inpatient  Consults  :  Cards  Procedures  :    TTE  DVT Prophylaxis  :  Coumadin  Lab Results  Component Value Date   INR 2.06* 06/29/2015   INR 2.03* 06/28/2015   INR 1.70* 10/23/2014     Lab Results  Component Value Date   PLT 204 06/28/2015    Inpatient Medications  Scheduled Meds: . allopurinol  100 mg Oral Daily  . citalopram  40 mg Oral q morning - 10a  . diltiazem  120 mg Oral QHS  . doxycycline  100 mg Oral Q12H  . fluticasone  1 spray Each Nare BID  . furosemide  80 mg Oral Daily  . guaiFENesin  600 mg Oral BID  . insulin aspart  0-15 Units Subcutaneous TID WC  . insulin aspart  0-5 Units Subcutaneous QHS  . ipratropium  0.5 mg Nebulization TID  . iron polysaccharides  150 mg Oral BID  . levalbuterol  0.63 mg Nebulization TID  . methylPREDNISolone (SOLU-MEDROL) injection  60 mg Intravenous Daily  . mometasone-formoterol  2 puff Inhalation BID  . polyethylene glycol  17 g Oral BID  . pravastatin  20 mg Oral q1800  . senna-docusate  1 tablet Oral QHS  . sodium chloride flush  3 mL Intravenous Q12H  . temazepam  15 mg Oral QHS  .  warfarin  5 mg Oral ONCE-1800  . Warfarin - Pharmacist Dosing Inpatient   Does not apply q1800   Continuous Infusions:  PRN Meds:.sodium chloride, acetaminophen, ALPRAZolam, levalbuterol, ondansetron (ZOFRAN) IV, sodium chloride flush, zolpidem  Antibiotics  :    Anti-infectives    Start     Dose/Rate Route Frequency Ordered Stop   06/29/15 0315  doxycycline (VIBRA-TABS) tablet 100 mg     100 mg Oral Every 12 hours 06/29/15 0305     06/29/15 0000  levofloxacin (LEVAQUIN) IVPB 750 mg     750 mg 100 mL/hr over 90 Minutes Intravenous  Once 06/28/15 2354 06/29/15 0225         Objective:   Filed Vitals:   06/29/15 0212 06/29/15 0336 06/29/15 0500 06/29/15 0739  BP: 123/57 137/80    Pulse: 78 73    Temp: 98.9 F (37.2 C) 98.7 F (37.1 C)    TempSrc: Oral Oral    Resp: 17 22    Height:  6\' 1"  (1.854 m)    Weight:  108.954 kg (240 lb 3.2 oz)    SpO2: 98% 96% 96% 100%    Wt Readings from Last 3 Encounters:  06/29/15 108.954 kg (240 lb 3.2 oz)  10/23/14 112.8 kg (248 lb 10.9 oz)  05/21/14 117.935 kg (260 lb)     Intake/Output Summary (Last 24 hours) at 06/29/15 1012 Last data filed at 06/29/15 0540  Gross per 24 hour  Intake    630 ml  Output   2350 ml  Net  -1720 ml     Physical Exam  Awake  Alert, Oriented X 3, No new F.N deficits, Normal affect [place].AT,PERRAL Supple Neck,No JVD, No cervical lymphadenopathy appriciated.  Symmetrical Chest wall movement, Good air movement bilaterally, CTAB RRR,No Gallops,Rubs , Aortic systolic murmur, No Parasternal Heave +ve B.Sounds, Abd Soft, No tenderness, No organomegaly appriciated, No rebound - guarding or rigidity. No Cyanosis, Clubbing or edema, No new Rash or bruise      Data Review:    CBC  Recent Labs Lab 06/28/15 2227  WBC 6.1  HGB 9.9*  HCT 31.4*  PLT 204  MCV 89.5  MCH 28.2  MCHC 31.5  RDW 14.3    Chemistries   Recent Labs Lab 06/28/15 2227  NA 137  K 4.0  CL 101  CO2 28  GLUCOSE 120*  BUN  21*  CREATININE 1.61*  CALCIUM 9.1  AST 19  ALT 11*  ALKPHOS 71  BILITOT 0.6   ------------------------------------------------------------------------------------------------------------------ No results for input(s): CHOL, HDL, LDLCALC, TRIG, CHOLHDL, LDLDIRECT in the last 72 hours.  Lab Results  Component Value Date   HGBA1C 6.6* 08/04/2013   ------------------------------------------------------------------------------------------------------------------ No results for input(s): TSH, T4TOTAL, T3FREE, THYROIDAB in the last 72 hours.  Invalid input(s): FREET3 ------------------------------------------------------------------------------------------------------------------  Recent Labs  06/29/15 0534  RETICCTPCT 1.5    Coagulation profile  Recent Labs Lab 06/28/15 2227 06/29/15 0534  INR 2.03* 2.06*    No results for input(s): DDIMER in the last 72 hours.  Cardiac Enzymes  Recent Labs Lab 06/29/15 0057 06/29/15 0740  TROPONINI 0.04* 0.03   ------------------------------------------------------------------------------------------------------------------    Component Value Date/Time   BNP 57.0 06/28/2015 2227    Micro Results No results found for this or any previous visit (from the past 240 hour(s)).  Radiology Reports Dg Chest 2 View  06/28/2015  CLINICAL DATA:  Chest pain and shortness of breath EXAM: CHEST  2 VIEW COMPARISON:  10/22/2014 chest radiograph. FINDINGS: Sternotomy wires appear aligned and intact. Stable cardiomediastinal silhouette with mild cardiomegaly. No pneumothorax. Trace bilateral pleural effusions. Mild pulmonary edema. IMPRESSION: 1. Mild congestive heart failure. 2. Trace bilateral pleural effusions. Electronically Signed   By: Ilona Sorrel M.D.   On: 06/28/2015 23:20    Time Spent in minutes  30   Fayette Gasner K M.D on 06/29/2015 at 10:12 AM  Between 7am to 7pm - Pager - [phone]  After 7pm go to [email or web address] -  password [facility]  [facility] -  Office  [phone]

## 2015-06-30 ENCOUNTER — Inpatient Hospital Stay (HOSPITAL_COMMUNITY): Payer: PPO

## 2015-06-30 DIAGNOSIS — I5031 Acute diastolic (congestive) heart failure: Secondary | ICD-10-CM

## 2015-06-30 DIAGNOSIS — I272 Other secondary pulmonary hypertension: Secondary | ICD-10-CM

## 2015-06-30 DIAGNOSIS — G4733 Obstructive sleep apnea (adult) (pediatric): Secondary | ICD-10-CM

## 2015-06-30 LAB — BASIC METABOLIC PANEL
ANION GAP: 8 (ref 5–15)
BUN: 24 mg/dL — ABNORMAL HIGH (ref 6–20)
CHLORIDE: 103 mmol/L (ref 101–111)
CO2: 28 mmol/L (ref 22–32)
Calcium: 9.1 mg/dL (ref 8.9–10.3)
Creatinine, Ser: 1.29 mg/dL — ABNORMAL HIGH (ref 0.61–1.24)
GFR calc Af Amer: 59 mL/min — ABNORMAL LOW (ref >60)
GFR, EST NON AFRICAN AMERICAN: 51 mL/min — AB (ref >60)
Glucose, Bld: 215 mg/dL — ABNORMAL HIGH (ref 65–99)
POTASSIUM: 4.3 mmol/L (ref 3.5–5.1)
SODIUM: 139 mmol/L (ref 135–145)

## 2015-06-30 LAB — GLUCOSE, CAPILLARY
GLUCOSE-CAPILLARY: 204 mg/dL — AB (ref 65–99)
GLUCOSE-CAPILLARY: 183 mg/dL — AB (ref 65–99)

## 2015-06-30 LAB — PROTIME-INR
INR: 1.72 — AB (ref 0.00–1.49)
PROTHROMBIN TIME: 20.1 s — AB (ref 11.6–15.2)

## 2015-06-30 MED ORDER — FUROSEMIDE 40 MG PO TABS: ORAL_TABLET | ORAL | Status: DC

## 2015-06-30 MED ORDER — FUROSEMIDE 40 MG PO TABS: 80.0000 mg | ORAL_TABLET | Freq: Every day | ORAL | Status: DC

## 2015-06-30 MED ORDER — FUROSEMIDE 10 MG/ML IJ SOLN
80.0000 mg | Freq: Once | INTRAMUSCULAR | Status: AC
  Administered 2015-06-30: 80 mg via INTRAVENOUS
  Filled 2015-06-30: qty 8

## 2015-06-30 NOTE — Discharge Instructions (Signed)
Follow with Primary MD Wenda Low, MD in 7 days   Get CBC, CMP, INR, 2 view Chest X ray checked  by Primary MD next visit.    Activity: As tolerated with Full fall precautions use walker/cane & assistance as needed   Disposition Home    Diet:   Heart Healthy Low Carb. Check your Weight same time everyday, if you gain over 2 pounds, or you develop in leg swelling, experience more shortness of breath or chest pain, call your Primary MD immediately. Follow Cardiac Low Salt Diet and 1.5 lit/day fluid restriction.   On your next visit with your primary care physician please Get Medicines reviewed and adjusted.   Please request your Prim.MD to go over all Hospital Tests and Procedure/Radiological results at the follow up, please get all Hospital records sent to your Prim MD by signing hospital release before you go home.   If you experience worsening of your admission symptoms, develop shortness of breath, life threatening emergency, suicidal or homicidal thoughts you must seek medical attention immediately by calling 911 or calling your MD immediately  if symptoms less severe.  You Must read complete instructions/literature along with all the possible adverse reactions/side effects for all the Medicines you take and that have been prescribed to you. Take any new Medicines after you have completely understood and accpet all the possible adverse reactions/side effects.   Do not drive, operate heavy machinery, perform activities at heights, swimming or participation in water activities or provide baby sitting services if your were admitted for syncope or siezures until you have seen by Primary MD or a Neurologist and advised to do so again.  Do not drive when taking Pain medications.    Do not take more than prescribed Pain, Sleep and Anxiety Medications  Special Instructions: If you have smoked or chewed Tobacco  in the last 2 yrs please stop smoking, stop any regular Alcohol  and or any  Recreational drug use.  Wear Seat belts while driving.   Please note  You were cared for by a hospitalist during your hospital stay. If you have any questions about your discharge medications or the care you received while you were in the hospital after you are discharged, you can call the unit and asked to speak with the hospitalist on call if the hospitalist that took care of you is not available. Once you are discharged, your primary care physician will handle any further medical issues. Please note that NO REFILLS for any discharge medications will be authorized once you are discharged, as it is imperative that you return to your primary care physician (or establish a relationship with a primary care physician if you do not have one) for your aftercare needs so that they can reassess your need for medications and monitor your lab values.

## 2015-06-30 NOTE — Care Management Note (Signed)
Case Management Note  Patient Details  Name: NAZAIRE SLAYBAUGH MRN: [id number] Date of Birth: 07-14-1932  Subjective/Objective:  Attending has put in HHRN/HHPT orders after d/c. Have attempted to contact patient on both tel #, left message on tel#'s, await call back to offer choice of [place] agencies.              Action/Plan:d/c home await call back for choice of [place] agencies.   Expected Discharge Date:                Expected Discharge Plan:  [place]  In-House Referral:     Discharge planning Services  CM Consult  Post Acute Care Choice:    Choice offered to:  Patient  DME Arranged:    DME Agency:     HH Arranged:  RN, PT [place] Agency:     Status of Service:  Completed, signed off  Medicare Important Message Given:    Date Medicare IM Given:    Medicare IM give by:    Date Additional Medicare IM Given:    Additional Medicare Important Message give by:     If discussed at [place] of Stay Meetings, dates discussed:    Additional Comments:  Dessa Phi, RN 06/30/2015, 11:17 AM

## 2015-06-30 NOTE — Progress Notes (Signed)
DAILY PROGRESS NOTE  Subjective:  Breathing better. Diuresed total of 1.5L negative. Creatinine improved from 1.69 to 1.29. Troponins are negative (one enzyme was 0.04, suspect related to CHF). Repeat echo shows normal systolic function with LVEF 55-60% and moderate aortic stenosis. INR low today at 1.72. Remains in rate-controlled a-fib.  Objective:  Temp:  [97.2 F (36.2 C)-97.3 F (36.3 C)] 97.2 F (36.2 C) (05/29 0422) Pulse Rate:  [69-81] 69 (05/29 0422) Resp:  [20-21] 20 (05/29 0422) BP: (111-123)/(48-69) 111/69 mmHg (05/29 0422) SpO2:  [94 %-100 %] 94 % (05/29 0422) Weight:  [242 lb 8.1 oz (110 kg)] 242 lb 8.1 oz (110 kg) (05/29 0422) Weight change: 2 lb 4.9 oz (1.046 kg)  Intake/Output from previous day: 05/28 0701 - 05/29 0700 In: 600 [P.O.:600] Out: 350 [Urine:350]  Intake/Output from this shift:    Medications: Current Facility-Administered Medications  Medication Dose Route Frequency Provider Last Rate Last Dose  . acetaminophen (TYLENOL) tablet 650 mg  650 mg Oral Q4H PRN Toy Baker, MD      . allopurinol (ZYLOPRIM) tablet 100 mg  100 mg Oral Daily Toy Baker, MD   100 mg at 06/29/15 1046  . ALPRAZolam Duanne Moron) tablet 0.25 mg  0.25 mg Oral BID PRN Toy Baker, MD   0.25 mg at 06/29/15 0412  . citalopram (CELEXA) tablet 40 mg  40 mg Oral q morning - 10a Toy Baker, MD   40 mg at 06/29/15 1046  . diltiazem (DILACOR XR) 24 hr capsule 120 mg  120 mg Oral QHS Toy Baker, MD   120 mg at 06/29/15 2233  . fluticasone (FLONASE) 50 MCG/ACT nasal spray 1 spray  1 spray Each Nare BID Toy Baker, MD   1 spray at 06/29/15 2235  . furosemide (LASIX) tablet 80 mg  80 mg Oral Daily Pixie Casino, MD   80 mg at 06/29/15 1045  . guaiFENesin (MUCINEX) 12 hr tablet 600 mg  600 mg Oral BID Toy Baker, MD   600 mg at 06/29/15 2233  . insulin aspart (novoLOG) injection 0-9 Units  0-9 Units Subcutaneous Q4H Hewitt Shorts [place], PA-C    3 Units at 06/30/15 4193  . ipratropium (ATROVENT) nebulizer solution 0.5 mg  0.5 mg Nebulization TID Thurnell Lose, MD   0.5 mg at 06/29/15 1950  . iron polysaccharides (NIFEREX) capsule 150 mg  150 mg Oral BID Toy Baker, MD   150 mg at 06/29/15 2234  . levalbuterol (XOPENEX) nebulizer solution 0.63 mg  0.63 mg Nebulization TID Thurnell Lose, MD   0.63 mg at 06/29/15 1949  . levalbuterol (XOPENEX) nebulizer solution 0.63 mg  0.63 mg Nebulization Q6H PRN Thurnell Lose, MD      . mometasone-formoterol [name]) 200-5 MCG/ACT inhaler 2 puff  2 puff Inhalation BID Toy Baker, MD   2 puff at 06/29/15 1950  . ondansetron (ZOFRAN) injection 4 mg  4 mg Intravenous Q6H PRN Toy Baker, MD      . polyethylene glycol (MIRALAX / GLYCOLAX) packet 17 g  17 g Oral BID Toy Baker, MD   17 g at 06/29/15 2235  . pravastatin (PRAVACHOL) tablet 20 mg  20 mg Oral q1800 Toy Baker, MD   20 mg at 06/29/15 1819  . senna-docusate (Senokot-S) tablet 1 tablet  1 tablet Oral QHS Toy Baker, MD   1 tablet at 06/29/15 0412  . temazepam (RESTORIL) capsule 15 mg  15 mg Oral QHS Toy Baker, MD   15 mg  at 06/29/15 2234  . Warfarin - Pharmacist Dosing Inpatient   Does not apply q1800 Toy Baker, MD      . zolpidem (AMBIEN) tablet 5 mg  5 mg Oral QHS PRN Toy Baker, MD        Physical Exam: General appearance: alert and no distress Lungs: clear to auscultation bilaterally Heart: irregularly irregular rhythm, S1, S2 normal and systolic murmur: late systolic 3/6, crescendo at 2nd left intercostal space Extremities: extremities normal, atraumatic, no cyanosis or edema  Lab Results: Results for orders placed or performed during the hospital encounter of 06/28/15 (from the past 48 hour(s))  Basic metabolic panel     Status: Abnormal   Collection Time: 06/28/15 10:27 PM  Result Value Ref Range   Sodium 137 135 - 145 mmol/L   Potassium 4.0 3.5 - 5.1  mmol/L   Chloride 101 101 - 111 mmol/L   CO2 28 22 - 32 mmol/L   Glucose, Bld 120 (H) 65 - 99 mg/dL   BUN 21 (H) 6 - 20 mg/dL   Creatinine, Ser 1.61 (H) 0.61 - 1.24 mg/dL   Calcium 9.1 8.9 - 10.3 mg/dL   GFR calc non Af Amer 39 (L) >60 mL/min   GFR calc Af Amer 45 (L) >60 mL/min    Comment: (NOTE) The eGFR has been calculated using the CKD EPI equation. This calculation has not been validated in all clinical situations. eGFR's persistently <60 mL/min signify possible Chronic Kidney Disease.    Anion gap 8 5 - 15  CBC     Status: Abnormal   Collection Time: 06/28/15 10:27 PM  Result Value Ref Range   WBC 6.1 4.0 - 10.5 K/uL   RBC 3.51 (L) 4.22 - 5.81 MIL/uL   Hemoglobin 9.9 (L) 13.0 - 17.0 g/dL   HCT 31.4 (L) 39.0 - 52.0 %   MCV 89.5 78.0 - 100.0 fL   MCH 28.2 26.0 - 34.0 pg   MCHC 31.5 30.0 - 36.0 g/dL   RDW 14.3 11.5 - 15.5 %   Platelets 204 150 - 400 K/uL  Protime-INR (order if Patient is taking Coumadin / Warfarin)     Status: Abnormal   Collection Time: 06/28/15 10:27 PM  Result Value Ref Range   Prothrombin Time 22.9 (H) 11.6 - 15.2 seconds   INR 2.03 (H) 0.00 - 1.49  Brain natriuretic peptide     Status: None   Collection Time: 06/28/15 10:27 PM  Result Value Ref Range   B Natriuretic Peptide 57.0 0.0 - 100.0 pg/mL  Hepatic function panel     Status: Abnormal   Collection Time: 06/28/15 10:27 PM  Result Value Ref Range   Total Protein 7.8 6.5 - 8.1 g/dL   Albumin 4.2 3.5 - 5.0 g/dL   AST 19 15 - 41 U/L   ALT 11 (L) 17 - 63 U/L   Alkaline Phosphatase 71 38 - 126 U/L   Total Bilirubin 0.6 0.3 - 1.2 mg/dL   Bilirubin, Direct <0.1 (L) 0.1 - 0.5 mg/dL   Indirect Bilirubin NOT CALCULATED 0.3 - 0.9 mg/dL  I-stat troponin, ED     Status: None   Collection Time: 06/28/15 10:31 PM  Result Value Ref Range   Troponin i, poc 0.02 0.00 - 0.08 ng/mL   Comment 3            Comment: Due to the release kinetics of cTnI, a negative result within the first hours of the onset  of symptoms does not rule  out myocardial infarction with certainty. If myocardial infarction is still suspected, repeat the test at appropriate intervals.   Procalcitonin - Baseline     Status: None   Collection Time: 06/29/15 12:48 AM  Result Value Ref Range   Procalcitonin <0.10 ng/mL    Comment:        Interpretation: PCT (Procalcitonin) <= 0.5 ng/mL: Systemic infection (sepsis) is not likely. Local bacterial infection is possible. (NOTE)         ICU PCT Algorithm               Non ICU PCT Algorithm    ----------------------------     ------------------------------         PCT < 0.25 ng/mL                 PCT < 0.1 ng/mL     Stopping of antibiotics            Stopping of antibiotics       strongly encouraged.               strongly encouraged.    ----------------------------     ------------------------------       PCT level decrease by               PCT < 0.25 ng/mL       >= 80% from peak PCT       OR PCT 0.25 - 0.5 ng/mL          Stopping of antibiotics                                             encouraged.     Stopping of antibiotics           encouraged.    ----------------------------     ------------------------------       PCT level decrease by              PCT >= 0.25 ng/mL       < 80% from peak PCT        AND PCT >= 0.5 ng/mL            Continuin g antibiotics                                              encouraged.       Continuing antibiotics            encouraged.    ----------------------------     ------------------------------     PCT level increase compared          PCT > 0.5 ng/mL         with peak PCT AND          PCT >= 0.5 ng/mL             Escalation of antibiotics                                          strongly encouraged.      Escalation of antibiotics        strongly encouraged.   I-Stat CG4 Lactic Acid, ED  Status: None   Collection Time: 06/29/15 12:57 AM  Result Value Ref Range   Lactic Acid, Venous 1.23 0.5 - 2.0 mmol/L  Troponin I  (q 6hr x 3)     Status: Abnormal   Collection Time: 06/29/15 12:57 AM  Result Value Ref Range   Troponin I 0.04 (H) <0.031 ng/mL    Comment:        PERSISTENTLY INCREASED TROPONIN VALUES IN THE RANGE OF 0.04-0.49 ng/mL CAN BE SEEN IN:       -UNSTABLE ANGINA       -CONGESTIVE HEART FAILURE       -MYOCARDITIS       -CHEST TRAUMA       -ARRYHTHMIAS       -LATE PRESENTING MYOCARDIAL INFARCTION       -COPD   CLINICAL FOLLOW-UP RECOMMENDED.   Creatinine, urine, random     Status: None   Collection Time: 06/29/15  4:11 AM  Result Value Ref Range   Creatinine, Urine 37.02 mg/dL    Comment: Performed at [facility]  Sodium, urine, random     Status: None   Collection Time: 06/29/15  4:11 AM  Result Value Ref Range   Sodium, Ur 89 mmol/L    Comment: Performed at [facility]  Lipid panel     Status: Abnormal   Collection Time: 06/29/15  5:34 AM  Result Value Ref Range   Cholesterol 102 0 - 200 mg/dL   Triglycerides 108 <150 mg/dL   HDL 31 (L) >40 mg/dL   Total CHOL/HDL Ratio 3.3 RATIO   VLDL 22 0 - 40 mg/dL   LDL Cholesterol 49 0 - 99 mg/dL    Comment:        Total Cholesterol/HDL:CHD Risk Coronary Heart Disease Risk Table                     Men   Women  1/2 Average Risk   3.4   3.3  Average Risk       5.0   4.4  2 X Average Risk   9.6   7.1  3 X Average Risk  23.4   11.0        Use the calculated Patient Ratio above and the CHD Risk Table to determine the patient's CHD Risk.        ATP III CLASSIFICATION (LDL):  <100     mg/dL   Optimal  100-129  mg/dL   Near or Above                    Optimal  130-159  mg/dL   Borderline  160-189  mg/dL   High  >190     mg/dL   Very High Performed at [facility]   Vitamin B12     Status: Abnormal   Collection Time: 06/29/15  5:34 AM  Result Value Ref Range   Vitamin B-12 125 (L) 180 - 914 pg/mL    Comment: (NOTE) This assay is not validated for testing neonatal or myeloproliferative syndrome specimens  for Vitamin B12 levels. Performed at [facility]   Folate     Status: None   Collection Time: 06/29/15  5:34 AM  Result Value Ref Range   Folate 25.0 >5.9 ng/mL    Comment: Performed at [facility]  Iron and TIBC     Status: Abnormal   Collection Time: 06/29/15  5:34 AM  Result Value Ref Range  Iron 25 (L) 45 - 182 ug/dL   TIBC 417 250 - 450 ug/dL   Saturation Ratios 6 (L) 17.9 - 39.5 %   UIBC 392 ug/dL    Comment: Performed at [facility]  Ferritin     Status: Abnormal   Collection Time: 06/29/15  5:34 AM  Result Value Ref Range   Ferritin 10 (L) 24 - 336 ng/mL    Comment: Performed at [facility]  Reticulocytes     Status: Abnormal   Collection Time: 06/29/15  5:34 AM  Result Value Ref Range   Retic Ct Pct 1.5 0.4 - 3.1 %   RBC. 3.51 (L) 4.22 - 5.81 MIL/uL   Retic Count, Manual 52.7 19.0 - 186.0 K/uL  Protime-INR     Status: Abnormal   Collection Time: 06/29/15  5:34 AM  Result Value Ref Range   Prothrombin Time 23.1 (H) 11.6 - 15.2 seconds   INR 2.06 (H) 0.00 - 1.49  Glucose, capillary     Status: Abnormal   Collection Time: 06/29/15  7:39 AM  Result Value Ref Range   Glucose-Capillary 183 (H) 65 - 99 mg/dL  Troponin I (q 6hr x 3)     Status: None   Collection Time: 06/29/15  7:40 AM  Result Value Ref Range   Troponin I 0.03 <0.031 ng/mL    Comment:        NO INDICATION OF MYOCARDIAL INJURY.   Occult blood card to lab, stool RN will collect     Status: Abnormal   Collection Time: 06/29/15 11:40 AM  Result Value Ref Range   Fecal Occult Bld POSITIVE (A) NEGATIVE  Glucose, capillary     Status: Abnormal   Collection Time: 06/29/15 12:12 PM  Result Value Ref Range   Glucose-Capillary 395 (H) 65 - 99 mg/dL  Troponin I (q 6hr x 3)     Status: None   Collection Time: 06/29/15  2:00 PM  Result Value Ref Range   Troponin I <0.03 <0.031 ng/mL    Comment:        NO INDICATION OF MYOCARDIAL INJURY.   Glucose, capillary     Status:  Abnormal   Collection Time: 06/29/15  5:28 PM  Result Value Ref Range   Glucose-Capillary 458 (H) 65 - 99 mg/dL  Glucose, capillary     Status: Abnormal   Collection Time: 06/29/15  7:34 PM  Result Value Ref Range   Glucose-Capillary 366 (H) 65 - 99 mg/dL  Glucose, capillary     Status: Abnormal   Collection Time: 06/29/15 11:55 PM  Result Value Ref Range   Glucose-Capillary 302 (H) 65 - 99 mg/dL  Glucose, capillary     Status: Abnormal   Collection Time: 06/30/15  4:17 AM  Result Value Ref Range   Glucose-Capillary 204 (H) 65 - 99 mg/dL  Basic metabolic panel     Status: Abnormal   Collection Time: 06/30/15  4:32 AM  Result Value Ref Range   Sodium 139 135 - 145 mmol/L   Potassium 4.3 3.5 - 5.1 mmol/L   Chloride 103 101 - 111 mmol/L   CO2 28 22 - 32 mmol/L   Glucose, Bld 215 (H) 65 - 99 mg/dL   BUN 24 (H) 6 - 20 mg/dL   Creatinine, Ser 1.29 (H) 0.61 - 1.24 mg/dL   Calcium 9.1 8.9 - 10.3 mg/dL   GFR calc non Af Amer 51 (L) >60 mL/min   GFR calc Af Amer 59 (L) >60  mL/min    Comment: (NOTE) The eGFR has been calculated using the CKD EPI equation. This calculation has not been validated in all clinical situations. eGFR's persistently <60 mL/min signify possible Chronic Kidney Disease.    Anion gap 8 5 - 15  Protime-INR     Status: Abnormal   Collection Time: 06/30/15  4:32 AM  Result Value Ref Range   Prothrombin Time 20.1 (H) 11.6 - 15.2 seconds   INR 1.72 (H) 0.00 - 1.49    Imaging: Dg Chest 2 View  06/28/2015  CLINICAL DATA:  Chest pain and shortness of breath EXAM: CHEST  2 VIEW COMPARISON:  10/22/2014 chest radiograph. FINDINGS: Sternotomy wires appear aligned and intact. Stable cardiomediastinal silhouette with mild cardiomegaly. No pneumothorax. Trace bilateral pleural effusions. Mild pulmonary edema. IMPRESSION: 1. Mild congestive heart failure. 2. Trace bilateral pleural effusions. Electronically Signed   By: Ilona Sorrel M.D.   On: 06/28/2015 23:20     Assessment:  1. Active Problems: 2.   DM2 (diabetes mellitus, type 2) ([place]) 3.   Atrial fibrillation ([place]) 4.   Obesity hypoventilation syndrome ([place]) 5.   Anemia, iron deficiency 6.   COPD (chronic obstructive pulmonary disease) with acute bronchitis ([place]) 7.   Pulmonary hypertension ([place]) 8.   Fluid overload 9.   Acute CHF ([place]) 10.   COPD exacerbation ([place]) 11.   Plan:  1. Appears adequately diuresed on current regimen. Continue PO lasix. Moderate aortic stenosis with preserved systolic function. Remains in rate-controlled a-fib. INR subtherapeutic, will need to increase warfarin dose tonight. No further suggestions at this point. Can follow-up with Dr. Percival Spanish after discharge.  Cardiology will sign-off. Call with questions.  Time Spent Directly with Patient:  15 minutes  Length of Stay:  LOS: 1 day   Pixie Casino, MD, [facility] Attending Cardiologist [place] 06/30/2015, 7:17 AM

## 2015-06-30 NOTE — Discharge Summary (Signed)
Joel [name], is a 82 y.o. male  DOB 1932-12-17  MRN [id number].  Admission date:  06/28/2015  Admitting Physician  Toy Baker, MD  Discharge Date:  06/30/2015   Primary MD  Wenda Low, MD  Recommendations for primary care physician for things to follow:   Monitor weight, BMP, INR and diuretic dose closely.   Admission Diagnosis  Shortness of breath [R06.02] Dyspnea [R06.00] Bilateral pleural effusion [J90] Chronic obstructive pulmonary disease with acute exacerbation (HCC) [J44.1] Acute on chronic congestive heart failure, unspecified congestive heart failure type (HCC) [I50.9]   Discharge Diagnosis  Shortness of breath [R06.02] Dyspnea [R06.00] Bilateral pleural effusion [J90] Chronic obstructive pulmonary disease with acute exacerbation (HCC) [J44.1] Acute on chronic congestive heart failure, unspecified congestive heart failure type (HCC) [I50.9]     Active Problems:   DM2 (diabetes mellitus, type 2) (HCC)   Atrial fibrillation (HCC)   Obesity hypoventilation syndrome (HCC)   Anemia, iron deficiency   COPD (chronic obstructive pulmonary disease) with acute bronchitis (HCC)   Pulmonary hypertension (HCC)   Fluid overload   Acute CHF ([place])   COPD exacerbation (HCC)      Past Medical History  Diagnosis Date  . COPD (chronic obstructive pulmonary disease) ([place])   . Atrial fibrillation ([place]) 1991    with multiple DCCV  . Type 2 diabetes mellitus ([place])   . Bipolar affective disorder ([place])   . Vertigo   . Anemia   . Gout   . Fatigue     last year or so  . OSA (obstructive sleep apnea)     severe, uses bipap 16 1/2 by 12 or 13 setting  . On home oxygen therapy 2 1/2 liters with bipap at night  . Hypertension   . Dyslipidemia   . Anxiety   . Insomnia   . Allergic rhinitis   .  Obesity   . Diabetic neuropathy ([place])     in feet  . Aortic stenosis 2012    mild to mod     Past Surgical History  Procedure Laterality Date  . Maze  1998  . Knee surgery  1970's    rt  . Shoulder surgery  7-8 yrs ago    rt  . Cardioversion  yrs ago  . Electro shock  1969    for depression  . Colonoscopy with propofol N/A 01/23/2013    Procedure: COLONOSCOPY WITH PROPOFOL;  Surgeon: Garlan Fair, MD;  Location: [facility];  Service: Endoscopy;  Laterality: N/A;  . Esophagogastroduodenoscopy (egd) with propofol N/A 01/23/2013    Procedure: ESOPHAGOGASTRODUODENOSCOPY (EGD) WITH PROPOFOL;  Surgeon: Garlan Fair, MD;  Location: [facility];  Service: Endoscopy;  Laterality: N/A;       HPI  from the history and physical done on the day of admission:    This is a pleasant 82 year old Caucasian male with history of COPD uses home oxygen as needed, moderate AS, obstructive sleep apnea uses nighttime oxygen, iron deficiency anemia, DM type II, A. fib status post Maze procedure  on Coumadin, who was admitted to the hospital on 06/29/15 for shortness of breath and orthopnea consistent with acute on chronic diastolic CHF. Seen by cardiology. Much improved after diuresis with Lasix IV.     Hospital Course:    1. Acute on chronic hypoxic respiratory failure due to acute on chronic diastolic CHF. EF 55% to 60% with moderate left ear. With falsely low BNP levels. He was treated with IV Lasix with good diuresis and resolution of his symptoms, he is completely symptom free now, seen by cardiologists morning and cleared for home discharge, he was taking 80 mg of Lasix once a day and will be switched to 80 in the morning and 40 at night. We'll request PCP to monitor his weight, BMP and diuretic dose closely. Outpatient cardiology follow-up in one week is also recommended post discharge.   2. COPD on home oxygen as needed. At baseline. Continue supportive care. Stopped steroids and  doxycycline yesterday.  3. Chronic atrial fibrillation status post Maze procedure, [place] vasc 2 score of greater than 4. Rate control will be the goal, continue Cardizem and Coumadin. Cardiology saw the patient here, continue follow-up with PCP and cardiologist postdischarge.  4. Dyslipidemia. Continue home dose statin.  5. Iron deficiency anemia. Continue oral iron supplementation.  6. OSA. Oxygen at night. Home BiPAP if he agrees to use.  7. Gout. Continue allopurinol.   8. History of anxiety. Home medications continued.   9. Mod AS - cards following, TTE stable.  10. ARF - due to CHF, diuresed, care of resolved, PCP to monitor BMP.  11. DM type II. Resume home regimen upon discharge.    Follow UP  Follow-up Information    Follow up with [name],[name], MD. Schedule an appointment as soon as possible for a visit in 1 week.   Specialty:  Internal Medicine   Why:  and your Heart doctor in 1 week   Contact information:   [address] [place] [address] [phone]        Consults obtained - Cards  Discharge Condition: Stable  Diet and Activity recommendation: See Discharge Instructions below  Discharge Instructions           Discharge Instructions    Discharge instructions    Complete by:  As directed   Follow with Primary MD Wenda Low, MD in 7 days   Get CBC, CMP, INR, 2 view Chest X ray checked  by Primary MD next visit.    Activity: As tolerated with Full fall precautions use walker/cane & assistance as needed   Disposition Home    Diet:   Heart Healthy Low Carb. Check your Weight same time everyday, if you gain over 2 pounds, or you develop in leg swelling, experience more shortness of breath or chest pain, call your Primary MD immediately. Follow Cardiac Low Salt Diet and 1.5 lit/day fluid restriction.   On your next visit with your primary care physician please Get Medicines reviewed and adjusted.   Please request your  Prim.MD to go over all Hospital Tests and Procedure/Radiological results at the follow up, please get all Hospital records sent to your Prim MD by signing hospital release before you go home.   If you experience worsening of your admission symptoms, develop shortness of breath, life threatening emergency, suicidal or homicidal thoughts you must seek medical attention immediately by calling 911 or calling your MD immediately  if symptoms less severe.  You Must read complete instructions/literature along with all the  possible adverse reactions/side effects for all the Medicines you take and that have been prescribed to you. Take any new Medicines after you have completely understood and accpet all the possible adverse reactions/side effects.   Do not drive, operate heavy machinery, perform activities at heights, swimming or participation in water activities or provide baby sitting services if your were admitted for syncope or siezures until you have seen by Primary MD or a Neurologist and advised to do so again.  Do not drive when taking Pain medications.    Do not take more than prescribed Pain, Sleep and Anxiety Medications  Special Instructions: If you have smoked or chewed Tobacco  in the last 2 yrs please stop smoking, stop any regular Alcohol  and or any Recreational drug use.  Wear Seat belts while driving.   Please note  You were cared for by a hospitalist during your hospital stay. If you have any questions about your discharge medications or the care you received while you were in the hospital after you are discharged, you can call the unit and asked to speak with the hospitalist on call if the hospitalist that took care of you is not available. Once you are discharged, your primary care physician will handle any further medical issues. Please note that NO REFILLS for any discharge medications will be authorized once you are discharged, as it is imperative that you return to your primary  care physician (or establish a relationship with a primary care physician if you do not have one) for your aftercare needs so that they can reassess your need for medications and monitor your lab values.     Increase activity slowly    Complete by:  As directed              Discharge Medications       Medication List    TAKE these medications        allopurinol 100 MG tablet  Commonly known as:  ZYLOPRIM  Take 100 mg by mouth daily.     citalopram 20 MG tablet  Commonly known as:  CELEXA  Take 1 tablet (20 mg total) by mouth every morning.     diltiazem 120 MG 24 hr capsule  Commonly known as:  DILACOR XR  Take 120 mg by mouth at bedtime.     fluticasone 50 MCG/ACT nasal spray  Commonly known as:  FLONASE  Place 1 spray into both nostrils 2 (two) times daily.     furosemide 40 MG tablet  Commonly known as:  LASIX  Take 2 pills in the morning and one at night     iron polysaccharides 150 MG capsule  Commonly known as:  NIFEREX  Take 1 capsule (150 mg total) by mouth 2 (two) times daily.     JANUVIA 100 MG tablet  Generic drug:  sitaGLIPtin  Take 1 tablet by mouth daily.     lovastatin 20 MG tablet  Commonly known as:  MEVACOR  Take 20 mg by mouth every morning.     metFORMIN 1000 MG tablet  Commonly known as:  GLUCOPHAGE  Take 1,000 mg by mouth 2 (two) times daily.     polyethylene glycol packet  Commonly known as:  MIRALAX / GLYCOLAX  Take 17 g by mouth 2 (two) times daily.     potassium chloride SA 20 MEQ tablet  Commonly known as:  K-DUR,KLOR-CON  Take 1 tablet by mouth daily.     senna-docusate 8.6-50 MG tablet  Commonly known as:  Senokot-S  Take 1 tablet by mouth at bedtime.     temazepam 15 MG capsule  Commonly known as:  RESTORIL  Take 1 capsule by mouth at bedtime.     Tiotropium Bromide-Olodaterol 2.5-2.5 MCG/ACT Aers  Commonly known as:  STIOLTO RESPIMAT  Inhale 2 puffs into the lungs daily.     valsartan 160 MG tablet  Commonly  known as:  DIOVAN  Take 160 mg by mouth every morning.     warfarin 5 MG tablet  Commonly known as:  COUMADIN  Take 5 mg by mouth every evening.        Major procedures and Radiology Reports - PLEASE review detailed and final reports for all details, in brief -   TTE  Mild to moderate LVH with LVEF 55-60%. Indeterminate diastolic function in the setting of atrial fibrillation. Moderate MAC with mild mitral regurgitation. Moderate calcific aortic stenosis as outlined above. Mildly dilated RV. Trivial tricuspid regurgitation.   Dg Chest 2 View  06/30/2015  CLINICAL DATA:  CT may be helpful for further evaluation. EXAM: CHEST  2 VIEW COMPARISON:  06/28/2015 FINDINGS: Cardiac shadow is mildly enlarged but stable. Postsurgical changes are again seen. The lungs are hyper aerated bilaterally. No sizable effusion is seen. No focal infiltrate is noted. Somewhat rounded density is noted on the lateral projection only. This may represent a small nodule. IMPRESSION: COPD without acute infiltrate. Questionable nodular density on the lateral projection. Electronically Signed   By: Inez Catalina M.D.   On: 06/30/2015 08:21   Dg Chest 2 View  06/28/2015  CLINICAL DATA:  Chest pain and shortness of breath EXAM: CHEST  2 VIEW COMPARISON:  10/22/2014 chest radiograph. FINDINGS: Sternotomy wires appear aligned and intact. Stable cardiomediastinal silhouette with mild cardiomegaly. No pneumothorax. Trace bilateral pleural effusions. Mild pulmonary edema. IMPRESSION: 1. Mild congestive heart failure. 2. Trace bilateral pleural effusions. Electronically Signed   By: Ilona Sorrel M.D.   On: 06/28/2015 23:20    Micro Results      No results found for this or any previous visit (from the past 240 hour(s)).     Today   Subjective    Fabien Renda today has no headache,no chest abdominal pain,no new weakness tingling or numbness, feels much better wants to go home today.    Objective   Blood pressure  111/69, pulse 69, temperature 97.2 F (36.2 C), temperature source Oral, resp. rate 20, height 6\' 1"  (1.854 m), weight 110 kg (242 lb 8.1 oz), SpO2 94 %.   Intake/Output Summary (Last 24 hours) at 06/30/15 0934 Last data filed at 06/30/15 0700  Gross per 24 hour  Intake    600 ml  Output    350 ml  Net    250 ml    Exam Awake Alert, Oriented x 3, No new F.N deficits, Normal affect [place].AT,PERRAL Supple Neck,No JVD, No cervical lymphadenopathy appriciated.  Symmetrical Chest wall movement, Good air movement bilaterally, CTAB RRR,No Gallops,Rubs or new Murmurs, No Parasternal Heave +ve B.Sounds, Abd Soft, Non tender, No organomegaly appriciated, No rebound -guarding or rigidity. No Cyanosis, Clubbing or edema, No new Rash or bruise   Data Review   CBC w Diff:  Lab Results  Component Value Date   WBC 6.1 06/28/2015   HGB 9.9* 06/28/2015   HCT 31.4* 06/28/2015   PLT 204 06/28/2015   LYMPHOPCT 30 09/14/2010   MONOPCT 11 09/14/2010   EOSPCT 3 09/14/2010   BASOPCT 0 09/14/2010  CMP:  Lab Results  Component Value Date   NA 139 06/30/2015   K 4.3 06/30/2015   CL 103 06/30/2015   CO2 28 06/30/2015   BUN 24* 06/30/2015   CREATININE 1.29* 06/30/2015   PROT 7.8 06/28/2015   ALBUMIN 4.2 06/28/2015   BILITOT 0.6 06/28/2015   ALKPHOS 71 06/28/2015   AST 19 06/28/2015   ALT 11* 06/28/2015  .   Total Time in preparing paper work, data evaluation and todays exam - 35 minutes  Thurnell Lose M.D on 06/30/2015 at 9:34 AM  [facility]   Office  [phone]

## 2015-06-30 NOTE — Care Management Note (Signed)
Case Management Note  Patient Details  Name: Joel [name] MRN: [id number] Date of Birth: 1932/03/22  Subjective/Objective:  59 y/ m admitted w/anemia. From home. PT recc HHPT. Already informed nurse of recc. Noted patient was d/c without [place] orders. Checked w/Nurse again-said patient was ready to leave, had called for ride,she states left stable.Contacted attending-he said patient stable to leave.                 Action/Plan:d/c home without [place] orders.   Expected Discharge Date:                Expected Discharge Plan:  [place]  In-House Referral:     Discharge planning Services  CM Consult  Post Acute Care Choice:    Choice offered to:     DME Arranged:    DME Agency:     HH Arranged:    [place] Agency:     Status of Service:  Completed, signed off  Medicare Important Message Given:    Date Medicare IM Given:    Medicare IM give by:    Date Additional Medicare IM Given:    Additional Medicare Important Message give by:     If discussed at [place] of Stay Meetings, dates discussed:    Additional Comments:  Dessa Phi, RN 06/30/2015, 11:07 AM

## 2015-06-30 NOTE — Progress Notes (Signed)
CSW received consult for COPD Gold Protocol, though patient does not meet qualifications (only 1 admission within the past 6 months).   CSW signing off.   Hanford Lust, LCSW [facility] Clinical Social Worker cell #: [phone]    

## 2015-06-30 NOTE — Care Management Note (Signed)
Case Management Note  Patient Details  Name: Joel [name] MRN: [id number] Date of Birth: 1932-08-07  Subjective/Objective:  Reached patient's dtr [name] c#[id number] chosne for [facility] rep aware of HHPT/RN orders, & d/c.                  Action/Plan:d/c home w/HHC   Expected Discharge Date:                 Expected Discharge Plan:  [place]  In-House Referral:     Discharge planning Services  CM Consult  Post Acute Care Choice:    Choice offered to:  Adult Children  DME Arranged:    DME Agency:     HH Arranged:  RN, PT [place] Agency:  [place]  Status of Service:  Completed, signed off  Medicare Important Message Given:    Date Medicare IM Given:    Medicare IM give by:    Date Additional Medicare IM Given:    Additional Medicare Important Message give by:     If discussed at [place] of Stay Meetings, dates discussed:    Additional Comments:  Dessa Phi, RN 06/30/2015, 11:36 AM

## 2015-06-30 NOTE — Progress Notes (Signed)
Patient given discharge, medication, and follow up instructions, verbalized understanding, IV and telemetry box removed, family to transport home

## 2015-07-01 DIAGNOSIS — D509 Iron deficiency anemia, unspecified: Secondary | ICD-10-CM | POA: Diagnosis not present

## 2015-07-01 DIAGNOSIS — E785 Hyperlipidemia, unspecified: Secondary | ICD-10-CM | POA: Diagnosis not present

## 2015-07-01 DIAGNOSIS — I5033 Acute on chronic diastolic (congestive) heart failure: Secondary | ICD-10-CM | POA: Diagnosis not present

## 2015-07-01 DIAGNOSIS — I4891 Unspecified atrial fibrillation: Secondary | ICD-10-CM | POA: Diagnosis not present

## 2015-07-01 DIAGNOSIS — Z7984 Long term (current) use of oral hypoglycemic drugs: Secondary | ICD-10-CM | POA: Diagnosis not present

## 2015-07-01 DIAGNOSIS — G4733 Obstructive sleep apnea (adult) (pediatric): Secondary | ICD-10-CM | POA: Diagnosis not present

## 2015-07-01 DIAGNOSIS — J441 Chronic obstructive pulmonary disease with (acute) exacerbation: Secondary | ICD-10-CM | POA: Diagnosis not present

## 2015-07-01 DIAGNOSIS — E662 Morbid (severe) obesity with alveolar hypoventilation: Secondary | ICD-10-CM | POA: Diagnosis not present

## 2015-07-01 DIAGNOSIS — E1140 Type 2 diabetes mellitus with diabetic neuropathy, unspecified: Secondary | ICD-10-CM | POA: Diagnosis not present

## 2015-07-01 DIAGNOSIS — Z9981 Dependence on supplemental oxygen: Secondary | ICD-10-CM | POA: Diagnosis not present

## 2015-07-01 DIAGNOSIS — Z7951 Long term (current) use of inhaled steroids: Secondary | ICD-10-CM | POA: Diagnosis not present

## 2015-07-01 DIAGNOSIS — F319 Bipolar disorder, unspecified: Secondary | ICD-10-CM | POA: Diagnosis not present

## 2015-07-01 DIAGNOSIS — I272 Other secondary pulmonary hypertension: Secondary | ICD-10-CM | POA: Diagnosis not present

## 2015-07-01 DIAGNOSIS — I110 Hypertensive heart disease with heart failure: Secondary | ICD-10-CM | POA: Diagnosis not present

## 2015-07-01 LAB — HEMOGLOBIN A1C
HEMOGLOBIN A1C: 7.2 % — AB (ref 4.8–5.6)
MEAN PLASMA GLUCOSE: 160 mg/dL

## 2015-07-01 LAB — HEPATITIS PANEL, ACUTE
HCV Ab: <0.1 s/co ratio (ref 0.0–0.9)
HEP A IGM: NEGATIVE
HEP B C IGM: NEGATIVE
Hepatitis B Surface Ag: NEGATIVE

## 2015-07-02 DIAGNOSIS — J189 Pneumonia, unspecified organism: Secondary | ICD-10-CM | POA: Diagnosis not present

## 2015-07-02 DIAGNOSIS — N179 Acute kidney failure, unspecified: Secondary | ICD-10-CM | POA: Diagnosis not present

## 2015-07-02 DIAGNOSIS — I509 Heart failure, unspecified: Secondary | ICD-10-CM | POA: Diagnosis not present

## 2015-07-02 DIAGNOSIS — I482 Chronic atrial fibrillation: Secondary | ICD-10-CM | POA: Diagnosis not present

## 2015-07-02 DIAGNOSIS — J449 Chronic obstructive pulmonary disease, unspecified: Secondary | ICD-10-CM | POA: Diagnosis not present

## 2015-07-09 DIAGNOSIS — E291 Testicular hypofunction: Secondary | ICD-10-CM | POA: Diagnosis not present

## 2015-07-09 DIAGNOSIS — Z7901 Long term (current) use of anticoagulants: Secondary | ICD-10-CM | POA: Diagnosis not present

## 2015-07-14 DIAGNOSIS — G4733 Obstructive sleep apnea (adult) (pediatric): Secondary | ICD-10-CM | POA: Diagnosis not present

## 2015-07-15 DIAGNOSIS — E1140 Type 2 diabetes mellitus with diabetic neuropathy, unspecified: Secondary | ICD-10-CM | POA: Diagnosis not present

## 2015-07-15 DIAGNOSIS — F319 Bipolar disorder, unspecified: Secondary | ICD-10-CM | POA: Diagnosis not present

## 2015-07-15 DIAGNOSIS — J441 Chronic obstructive pulmonary disease with (acute) exacerbation: Secondary | ICD-10-CM | POA: Diagnosis not present

## 2015-07-15 DIAGNOSIS — E785 Hyperlipidemia, unspecified: Secondary | ICD-10-CM | POA: Diagnosis not present

## 2015-07-15 DIAGNOSIS — E662 Morbid (severe) obesity with alveolar hypoventilation: Secondary | ICD-10-CM | POA: Diagnosis not present

## 2015-07-15 DIAGNOSIS — Z7984 Long term (current) use of oral hypoglycemic drugs: Secondary | ICD-10-CM | POA: Diagnosis not present

## 2015-07-15 DIAGNOSIS — I5033 Acute on chronic diastolic (congestive) heart failure: Secondary | ICD-10-CM | POA: Diagnosis not present

## 2015-07-15 DIAGNOSIS — G4733 Obstructive sleep apnea (adult) (pediatric): Secondary | ICD-10-CM | POA: Diagnosis not present

## 2015-07-15 DIAGNOSIS — I110 Hypertensive heart disease with heart failure: Secondary | ICD-10-CM | POA: Diagnosis not present

## 2015-07-15 DIAGNOSIS — Z9981 Dependence on supplemental oxygen: Secondary | ICD-10-CM | POA: Diagnosis not present

## 2015-07-15 DIAGNOSIS — I4891 Unspecified atrial fibrillation: Secondary | ICD-10-CM | POA: Diagnosis not present

## 2015-07-15 DIAGNOSIS — D509 Iron deficiency anemia, unspecified: Secondary | ICD-10-CM | POA: Diagnosis not present

## 2015-07-15 DIAGNOSIS — I272 Other secondary pulmonary hypertension: Secondary | ICD-10-CM | POA: Diagnosis not present

## 2015-07-15 DIAGNOSIS — Z7951 Long term (current) use of inhaled steroids: Secondary | ICD-10-CM | POA: Diagnosis not present

## 2015-07-21 DIAGNOSIS — E662 Morbid (severe) obesity with alveolar hypoventilation: Secondary | ICD-10-CM | POA: Diagnosis not present

## 2015-07-21 DIAGNOSIS — J441 Chronic obstructive pulmonary disease with (acute) exacerbation: Secondary | ICD-10-CM | POA: Diagnosis not present

## 2015-07-21 DIAGNOSIS — E785 Hyperlipidemia, unspecified: Secondary | ICD-10-CM | POA: Diagnosis not present

## 2015-07-21 DIAGNOSIS — I5033 Acute on chronic diastolic (congestive) heart failure: Secondary | ICD-10-CM | POA: Diagnosis not present

## 2015-07-21 DIAGNOSIS — I110 Hypertensive heart disease with heart failure: Secondary | ICD-10-CM | POA: Diagnosis not present

## 2015-07-21 DIAGNOSIS — D509 Iron deficiency anemia, unspecified: Secondary | ICD-10-CM | POA: Diagnosis not present

## 2015-07-21 DIAGNOSIS — F319 Bipolar disorder, unspecified: Secondary | ICD-10-CM | POA: Diagnosis not present

## 2015-07-21 DIAGNOSIS — E1140 Type 2 diabetes mellitus with diabetic neuropathy, unspecified: Secondary | ICD-10-CM | POA: Diagnosis not present

## 2015-07-21 DIAGNOSIS — Z7984 Long term (current) use of oral hypoglycemic drugs: Secondary | ICD-10-CM | POA: Diagnosis not present

## 2015-07-21 DIAGNOSIS — G4733 Obstructive sleep apnea (adult) (pediatric): Secondary | ICD-10-CM | POA: Diagnosis not present

## 2015-07-21 DIAGNOSIS — Z7951 Long term (current) use of inhaled steroids: Secondary | ICD-10-CM | POA: Diagnosis not present

## 2015-07-21 DIAGNOSIS — Z9981 Dependence on supplemental oxygen: Secondary | ICD-10-CM | POA: Diagnosis not present

## 2015-07-21 DIAGNOSIS — I272 Other secondary pulmonary hypertension: Secondary | ICD-10-CM | POA: Diagnosis not present

## 2015-07-21 DIAGNOSIS — I4891 Unspecified atrial fibrillation: Secondary | ICD-10-CM | POA: Diagnosis not present

## 2015-07-22 ENCOUNTER — Ambulatory Visit (INDEPENDENT_AMBULATORY_CARE_PROVIDER_SITE_OTHER): Payer: PPO | Admitting: Physician Assistant

## 2015-07-22 ENCOUNTER — Encounter: Payer: Self-pay | Admitting: Physician Assistant

## 2015-07-22 VITALS — BP 122/64 | HR 71 | Ht 73.0 in | Wt 236.2 lb

## 2015-07-22 DIAGNOSIS — I5032 Chronic diastolic (congestive) heart failure: Secondary | ICD-10-CM

## 2015-07-22 DIAGNOSIS — Z7951 Long term (current) use of inhaled steroids: Secondary | ICD-10-CM | POA: Diagnosis not present

## 2015-07-22 DIAGNOSIS — I481 Persistent atrial fibrillation: Secondary | ICD-10-CM | POA: Diagnosis not present

## 2015-07-22 DIAGNOSIS — Z7901 Long term (current) use of anticoagulants: Secondary | ICD-10-CM

## 2015-07-22 DIAGNOSIS — J441 Chronic obstructive pulmonary disease with (acute) exacerbation: Secondary | ICD-10-CM | POA: Diagnosis not present

## 2015-07-22 DIAGNOSIS — E662 Morbid (severe) obesity with alveolar hypoventilation: Secondary | ICD-10-CM | POA: Diagnosis not present

## 2015-07-22 DIAGNOSIS — G4733 Obstructive sleep apnea (adult) (pediatric): Secondary | ICD-10-CM | POA: Diagnosis not present

## 2015-07-22 DIAGNOSIS — F319 Bipolar disorder, unspecified: Secondary | ICD-10-CM | POA: Diagnosis not present

## 2015-07-22 DIAGNOSIS — Z9981 Dependence on supplemental oxygen: Secondary | ICD-10-CM | POA: Diagnosis not present

## 2015-07-22 DIAGNOSIS — I110 Hypertensive heart disease with heart failure: Secondary | ICD-10-CM | POA: Diagnosis not present

## 2015-07-22 DIAGNOSIS — E1140 Type 2 diabetes mellitus with diabetic neuropathy, unspecified: Secondary | ICD-10-CM | POA: Diagnosis not present

## 2015-07-22 DIAGNOSIS — E785 Hyperlipidemia, unspecified: Secondary | ICD-10-CM | POA: Diagnosis not present

## 2015-07-22 DIAGNOSIS — D509 Iron deficiency anemia, unspecified: Secondary | ICD-10-CM | POA: Diagnosis not present

## 2015-07-22 DIAGNOSIS — I272 Other secondary pulmonary hypertension: Secondary | ICD-10-CM | POA: Diagnosis not present

## 2015-07-22 DIAGNOSIS — I5033 Acute on chronic diastolic (congestive) heart failure: Secondary | ICD-10-CM | POA: Diagnosis not present

## 2015-07-22 DIAGNOSIS — I4891 Unspecified atrial fibrillation: Secondary | ICD-10-CM | POA: Diagnosis not present

## 2015-07-22 DIAGNOSIS — Z7984 Long term (current) use of oral hypoglycemic drugs: Secondary | ICD-10-CM | POA: Diagnosis not present

## 2015-07-22 DIAGNOSIS — I4819 Other persistent atrial fibrillation: Secondary | ICD-10-CM

## 2015-07-22 NOTE — Progress Notes (Signed)
Cardiology Office Note   Date:  07/22/2015   ID:  Joel [name], DOB [birth year], MRN [id number]  PCP:  Wenda Low, MD  Cardiologist:  Dr Mardene Celeste, PA-C   Chief Complaint  Patient presents with  . CHF    f/u ED visit    History of Present Illness: Joel [name] is a 82 y.o. male with a history of chronic afib s/p Cox-Maze proc, DCCV, mod AS, ILD w/ home O2 and qhs BiPAP, CHADS2VASC=4 on coumadin, DM, HTN, bipolar, D-CHF  D/c 05/29 after admission for CHF, d/c weight 242 lbs  [place] presents for Follow-up of his CHF.  This discharge from the hospital, Joel [name] feels he has done pretty well. His shortness of breath has improved. He does not feel that his weight has gone up at all. He is compliant with his medications. He is concerned that he is in atrial fibrillation. However, he does not get palpitations.  He is not having chest pain. He is increasing his activity some and doing well with this. His Coumadin is checked at Dr. Sherilyn Cooter office, he is therapeutic.  He is also concerned about the aortic stenosis, what it is and what it means.   Past Medical History  Diagnosis Date  . COPD (chronic obstructive pulmonary disease) ([place])   . Atrial fibrillation ([place]) 1991    with multiple DCCV  . Type 2 diabetes mellitus ([place])   . Bipolar affective disorder ([place])   . Vertigo   . Anemia   . Gout   . Fatigue     last year or so  . OSA (obstructive sleep apnea)     severe, uses bipap 16 1/2 by 12 or 13 setting  . On home oxygen therapy 2 1/2 liters with bipap at night  . Hypertension   . Dyslipidemia   . Anxiety   . Insomnia   . Allergic rhinitis   . Obesity   . Diabetic neuropathy ([place])     in feet  . Aortic stenosis 2012    mild to mod     Past Surgical History  Procedure Laterality Date  . Maze  1998  . Knee surgery  1970's    rt  . Shoulder surgery  7-8 yrs ago    rt  . Cardioversion  yrs ago  . Electro shock  1969    for  depression  . Colonoscopy with propofol N/A 01/23/2013    Procedure: COLONOSCOPY WITH PROPOFOL;  Surgeon: Garlan Fair, MD;  [facility];  Service: Endoscopy;  Laterality: N/A;  . Esophagogastroduodenoscopy (egd) with propofol N/A 01/23/2013    Procedure: ESOPHAGOGASTRODUODENOSCOPY (EGD) WITH PROPOFOL;  Surgeon: Garlan Fair, MD;  [facility];  Service: Endoscopy;  Laterality: N/A;    Current Outpatient Prescriptions  Medication Sig Dispense Refill  . allopurinol (ZYLOPRIM) 100 MG tablet Take 100 mg by mouth daily.     . citalopram (CELEXA) 40 MG tablet Take 40 mg by mouth every morning.  0  . diltiazem (DILACOR XR) 120 MG 24 hr capsule Take 120 mg by mouth at bedtime.    . fluticasone (FLONASE) 50 MCG/ACT nasal spray Place 1 spray into both nostrils 2 (two) times daily.    . furosemide (LASIX) 40 MG tablet Take 40 mg by mouth every morning.  0  . iron polysaccharides (NIFEREX) 150 MG capsule Take 1 capsule (150 mg total) by mouth 2 (two) times daily. 60 capsule 0  .  JANUVIA 100 MG tablet Take 1 tablet by mouth daily.  1  . lovastatin (MEVACOR) 20 MG tablet Take 20 mg by mouth every morning.     . metFORMIN (GLUCOPHAGE) 1000 MG tablet Take 1,000 mg by mouth 2 (two) times daily.   0  . polyethylene glycol (MIRALAX / GLYCOLAX) packet Take 17 g by mouth 2 (two) times daily. 14 each 0  . potassium chloride SA (K-DUR,KLOR-CON) 20 MEQ tablet Take 1 tablet by mouth daily.  0  . senna-docusate (SENOKOT-S) 8.6-50 MG per tablet Take 1 tablet by mouth at bedtime.    . SYMBICORT 160-4.5 MCG/ACT inhaler Inhale 2 puffs into the lungs 2 (two) times daily.  0  . temazepam (RESTORIL) 15 MG capsule Take 1 capsule by mouth at bedtime.   0  . Tiotropium Bromide-Olodaterol (STIOLTO RESPIMAT) 2.5-2.5 MCG/ACT AERS Inhale 2 puffs into the lungs daily. 1 Inhaler 0  . valsartan (DIOVAN) 160 MG tablet Take 160 mg by mouth every morning.     . warfarin (COUMADIN) 5 MG tablet Take 5 mg by  mouth every evening.      No current facility-administered medications for this visit.    Allergies:   Antihistamines, loratadine-type; Meclizine; and Other    Social History:  The patient  reports that he quit smoking about 39 years ago. His smoking use included Cigarettes. He has a 50 pack-year smoking history. He has never used smokeless tobacco. He reports that he does not drink alcohol or use illicit drugs.   Family History:  The patient's family history includes Tuberculosis in his maternal grandmother. There is no history of Heart attack, Diabetes, CAD, or Cancer.    ROS:  Please see the history of present illness. All other systems are reviewed and negative.    PHYSICAL EXAM: VS:  BP 122/64 mmHg  Pulse 71  Ht 6\' 1"  (1.854 m)  Wt 236 lb 3.2 oz (107.14 kg)  BMI 31.17 kg/m2  SpO2 98% , BMI Body mass index is 31.17 kg/(m^2). GEN: Well nourished, well developed, male in no acute distress HEENT: normal for age  Neck: Minimal JVD, no carotid bruit, no masses Cardiac: Slightly irregular; 2/6 aortic stenosis murmur, no rubs, or gallops Respiratory:  clear to auscultation bilaterally, normal work of breathing GI: soft, nontender, nondistended, + BS MS: no deformity or atrophy; trace lower extremity edema; distal pulses are 2+ in all 4 extremities  Skin: warm and dry, no rash Neuro:  Strength and sensation are intact Psych: euthymic mood, full affect   EKG:  EKG is ordered today. The ekg ordered today demonstrates atrial fibrillation, prolonged QT at 444 ms (previous QT was 436 ms), no acute ischemic changes   Recent Labs: 06/28/2015: ALT 11*; B Natriuretic Peptide 57.0; Hemoglobin 9.9*; Platelets 204 06/30/2015: BUN 24*; Creatinine, Ser 1.29*; Potassium 4.3; Sodium 139    Lipid Panel    Component Value Date/Time   CHOL 102 06/29/2015 0534   TRIG 108 06/29/2015 0534   HDL 31* 06/29/2015 0534   CHOLHDL 3.3 06/29/2015 0534   VLDL 22 06/29/2015 0534   LDLCALC 49 06/29/2015  0534     Wt Readings from Last 3 Encounters:  07/22/15 236 lb 3.2 oz (107.14 kg)  06/30/15 242 lb 8.1 oz (110 kg)  10/23/14 248 lb 10.9 oz (112.8 kg)     Other studies Reviewed: Additional studies/ records that were reviewed today include: Hospital records, office notes and testing.  ASSESSMENT AND PLAN:  1.  Chronic diastolic CHF: His  volume status appears stable by exam. No change in his Lasix dose at this time. He is to clarify the dose of Lasix that he is taking and call us back with that. His volume status is good, we will continue the current Lasix dose with no changes. He is encouraged to continue a low-sodium diet and daily weights.  2. Atrial fibrillation: I reassured him that the atrial fibrillation was not a problem, his rate is controlled and he is not having any symptoms attributable to this. He was advised that if he begins to get any problems with symptoms that he feels are coming from the atrial fibrillation, he is to call us. Continue diltiazem at the current dose.   He has had recurrence of atrial fibrillation after Cox-Maze procedure and cardioversion. M.D. to reviewed data and decide if we should try to maintain sinus rhythm. Left atrium was normal in size per recent echo.  3. Chronic anticoagulation: His Coumadin is followed by Dr. Deforest Hoyles. He knows it is supposed to be between 2 and 3 and says it is there. Call us if he has any questions or concerns.   Current medicines are reviewed at length with the patient today.  The patient does not have concerns regarding medicines.  The following changes have been made:  no change  Labs/ tests ordered today include:   ECG   Disposition:   FU with Dr. Percival Spanish   Signed, Rosaria Ferries, PA-C  07/22/2015 5:48 PM    [place] Phone: [phone]; Fax: [phone]  This note was written with the assistance of speech recognition software. Please excuse any transcriptional errors.

## 2015-07-22 NOTE — Patient Instructions (Signed)
Your physician recommends that you continue on your current medications as directed. Please refer to the Current Medication list given to you today.  Please call our office at [phone] with your LASIX dose so this can be updated in our records.   Your physician wants you to follow-up in: Dr. Percival Spanish in [place]. You will receive a reminder letter in the mail two months in advance. If you don't receive a letter, please call our office to schedule the follow-up appointment.

## 2015-07-23 DIAGNOSIS — D509 Iron deficiency anemia, unspecified: Secondary | ICD-10-CM | POA: Diagnosis not present

## 2015-07-23 DIAGNOSIS — I110 Hypertensive heart disease with heart failure: Secondary | ICD-10-CM | POA: Diagnosis not present

## 2015-07-23 DIAGNOSIS — G4733 Obstructive sleep apnea (adult) (pediatric): Secondary | ICD-10-CM | POA: Diagnosis not present

## 2015-07-23 DIAGNOSIS — Z7984 Long term (current) use of oral hypoglycemic drugs: Secondary | ICD-10-CM | POA: Diagnosis not present

## 2015-07-23 DIAGNOSIS — J441 Chronic obstructive pulmonary disease with (acute) exacerbation: Secondary | ICD-10-CM | POA: Diagnosis not present

## 2015-07-23 DIAGNOSIS — I4891 Unspecified atrial fibrillation: Secondary | ICD-10-CM | POA: Diagnosis not present

## 2015-07-23 DIAGNOSIS — F319 Bipolar disorder, unspecified: Secondary | ICD-10-CM | POA: Diagnosis not present

## 2015-07-23 DIAGNOSIS — Z9981 Dependence on supplemental oxygen: Secondary | ICD-10-CM | POA: Diagnosis not present

## 2015-07-23 DIAGNOSIS — E1140 Type 2 diabetes mellitus with diabetic neuropathy, unspecified: Secondary | ICD-10-CM | POA: Diagnosis not present

## 2015-07-23 DIAGNOSIS — E785 Hyperlipidemia, unspecified: Secondary | ICD-10-CM | POA: Diagnosis not present

## 2015-07-23 DIAGNOSIS — I272 Other secondary pulmonary hypertension: Secondary | ICD-10-CM | POA: Diagnosis not present

## 2015-07-23 DIAGNOSIS — Z7951 Long term (current) use of inhaled steroids: Secondary | ICD-10-CM | POA: Diagnosis not present

## 2015-07-23 DIAGNOSIS — E662 Morbid (severe) obesity with alveolar hypoventilation: Secondary | ICD-10-CM | POA: Diagnosis not present

## 2015-07-23 DIAGNOSIS — I5033 Acute on chronic diastolic (congestive) heart failure: Secondary | ICD-10-CM | POA: Diagnosis not present

## 2015-07-25 DIAGNOSIS — E1140 Type 2 diabetes mellitus with diabetic neuropathy, unspecified: Secondary | ICD-10-CM | POA: Diagnosis not present

## 2015-07-25 DIAGNOSIS — Z7984 Long term (current) use of oral hypoglycemic drugs: Secondary | ICD-10-CM | POA: Diagnosis not present

## 2015-07-25 DIAGNOSIS — I5033 Acute on chronic diastolic (congestive) heart failure: Secondary | ICD-10-CM | POA: Diagnosis not present

## 2015-07-25 DIAGNOSIS — I4891 Unspecified atrial fibrillation: Secondary | ICD-10-CM | POA: Diagnosis not present

## 2015-07-25 DIAGNOSIS — Z9981 Dependence on supplemental oxygen: Secondary | ICD-10-CM | POA: Diagnosis not present

## 2015-07-25 DIAGNOSIS — D509 Iron deficiency anemia, unspecified: Secondary | ICD-10-CM | POA: Diagnosis not present

## 2015-07-25 DIAGNOSIS — F319 Bipolar disorder, unspecified: Secondary | ICD-10-CM | POA: Diagnosis not present

## 2015-07-25 DIAGNOSIS — G4733 Obstructive sleep apnea (adult) (pediatric): Secondary | ICD-10-CM | POA: Diagnosis not present

## 2015-07-25 DIAGNOSIS — E662 Morbid (severe) obesity with alveolar hypoventilation: Secondary | ICD-10-CM | POA: Diagnosis not present

## 2015-07-25 DIAGNOSIS — I110 Hypertensive heart disease with heart failure: Secondary | ICD-10-CM | POA: Diagnosis not present

## 2015-07-25 DIAGNOSIS — E785 Hyperlipidemia, unspecified: Secondary | ICD-10-CM | POA: Diagnosis not present

## 2015-07-25 DIAGNOSIS — Z7951 Long term (current) use of inhaled steroids: Secondary | ICD-10-CM | POA: Diagnosis not present

## 2015-07-25 DIAGNOSIS — J441 Chronic obstructive pulmonary disease with (acute) exacerbation: Secondary | ICD-10-CM | POA: Diagnosis not present

## 2015-07-25 DIAGNOSIS — I272 Other secondary pulmonary hypertension: Secondary | ICD-10-CM | POA: Diagnosis not present

## 2015-07-29 DIAGNOSIS — F319 Bipolar disorder, unspecified: Secondary | ICD-10-CM | POA: Diagnosis not present

## 2015-07-29 DIAGNOSIS — I5033 Acute on chronic diastolic (congestive) heart failure: Secondary | ICD-10-CM | POA: Diagnosis not present

## 2015-07-29 DIAGNOSIS — I4891 Unspecified atrial fibrillation: Secondary | ICD-10-CM | POA: Diagnosis not present

## 2015-07-29 DIAGNOSIS — J441 Chronic obstructive pulmonary disease with (acute) exacerbation: Secondary | ICD-10-CM | POA: Diagnosis not present

## 2015-07-29 DIAGNOSIS — Z7984 Long term (current) use of oral hypoglycemic drugs: Secondary | ICD-10-CM | POA: Diagnosis not present

## 2015-07-29 DIAGNOSIS — E785 Hyperlipidemia, unspecified: Secondary | ICD-10-CM | POA: Diagnosis not present

## 2015-07-29 DIAGNOSIS — D509 Iron deficiency anemia, unspecified: Secondary | ICD-10-CM | POA: Diagnosis not present

## 2015-07-29 DIAGNOSIS — E662 Morbid (severe) obesity with alveolar hypoventilation: Secondary | ICD-10-CM | POA: Diagnosis not present

## 2015-07-29 DIAGNOSIS — E1140 Type 2 diabetes mellitus with diabetic neuropathy, unspecified: Secondary | ICD-10-CM | POA: Diagnosis not present

## 2015-07-29 DIAGNOSIS — I110 Hypertensive heart disease with heart failure: Secondary | ICD-10-CM | POA: Diagnosis not present

## 2015-07-29 DIAGNOSIS — G4733 Obstructive sleep apnea (adult) (pediatric): Secondary | ICD-10-CM | POA: Diagnosis not present

## 2015-07-29 DIAGNOSIS — Z7951 Long term (current) use of inhaled steroids: Secondary | ICD-10-CM | POA: Diagnosis not present

## 2015-07-29 DIAGNOSIS — I272 Other secondary pulmonary hypertension: Secondary | ICD-10-CM | POA: Diagnosis not present

## 2015-07-29 DIAGNOSIS — Z9981 Dependence on supplemental oxygen: Secondary | ICD-10-CM | POA: Diagnosis not present

## 2015-08-01 DIAGNOSIS — E662 Morbid (severe) obesity with alveolar hypoventilation: Secondary | ICD-10-CM | POA: Diagnosis not present

## 2015-08-01 DIAGNOSIS — I5033 Acute on chronic diastolic (congestive) heart failure: Secondary | ICD-10-CM | POA: Diagnosis not present

## 2015-08-01 DIAGNOSIS — Z9981 Dependence on supplemental oxygen: Secondary | ICD-10-CM | POA: Diagnosis not present

## 2015-08-01 DIAGNOSIS — Z7951 Long term (current) use of inhaled steroids: Secondary | ICD-10-CM | POA: Diagnosis not present

## 2015-08-01 DIAGNOSIS — G4733 Obstructive sleep apnea (adult) (pediatric): Secondary | ICD-10-CM | POA: Diagnosis not present

## 2015-08-01 DIAGNOSIS — Z7984 Long term (current) use of oral hypoglycemic drugs: Secondary | ICD-10-CM | POA: Diagnosis not present

## 2015-08-01 DIAGNOSIS — I110 Hypertensive heart disease with heart failure: Secondary | ICD-10-CM | POA: Diagnosis not present

## 2015-08-01 DIAGNOSIS — E1140 Type 2 diabetes mellitus with diabetic neuropathy, unspecified: Secondary | ICD-10-CM | POA: Diagnosis not present

## 2015-08-01 DIAGNOSIS — D509 Iron deficiency anemia, unspecified: Secondary | ICD-10-CM | POA: Diagnosis not present

## 2015-08-01 DIAGNOSIS — I272 Other secondary pulmonary hypertension: Secondary | ICD-10-CM | POA: Diagnosis not present

## 2015-08-01 DIAGNOSIS — I4891 Unspecified atrial fibrillation: Secondary | ICD-10-CM | POA: Diagnosis not present

## 2015-08-01 DIAGNOSIS — E785 Hyperlipidemia, unspecified: Secondary | ICD-10-CM | POA: Diagnosis not present

## 2015-08-01 DIAGNOSIS — J441 Chronic obstructive pulmonary disease with (acute) exacerbation: Secondary | ICD-10-CM | POA: Diagnosis not present

## 2015-08-01 DIAGNOSIS — F319 Bipolar disorder, unspecified: Secondary | ICD-10-CM | POA: Diagnosis not present

## 2015-08-14 ENCOUNTER — Emergency Department (HOSPITAL_COMMUNITY): Payer: PPO

## 2015-08-14 ENCOUNTER — Emergency Department (HOSPITAL_COMMUNITY)
Admission: EM | Admit: 2015-08-14 | Discharge: 2015-08-14 | Disposition: A | Discharge status: Home / Self Care | Payer: PPO | Attending: Emergency Medicine | Admitting: Emergency Medicine

## 2015-08-14 ENCOUNTER — Encounter (HOSPITAL_COMMUNITY): Payer: Self-pay

## 2015-08-14 DIAGNOSIS — E785 Hyperlipidemia, unspecified: Secondary | ICD-10-CM | POA: Diagnosis not present

## 2015-08-14 DIAGNOSIS — I10 Essential (primary) hypertension: Secondary | ICD-10-CM | POA: Insufficient documentation

## 2015-08-14 DIAGNOSIS — E119 Type 2 diabetes mellitus without complications: Secondary | ICD-10-CM | POA: Insufficient documentation

## 2015-08-14 DIAGNOSIS — S0010XA Contusion of unspecified eyelid and periocular area, initial encounter: Secondary | ICD-10-CM | POA: Diagnosis not present

## 2015-08-14 DIAGNOSIS — W01198A Fall on same level from slipping, tripping and stumbling with subsequent striking against other object, initial encounter: Secondary | ICD-10-CM | POA: Diagnosis not present

## 2015-08-14 DIAGNOSIS — Z7901 Long term (current) use of anticoagulants: Secondary | ICD-10-CM | POA: Insufficient documentation

## 2015-08-14 DIAGNOSIS — R0789 Other chest pain: Secondary | ICD-10-CM | POA: Diagnosis not present

## 2015-08-14 DIAGNOSIS — Z79899 Other long term (current) drug therapy: Secondary | ICD-10-CM | POA: Diagnosis not present

## 2015-08-14 DIAGNOSIS — S60511A Abrasion of right hand, initial encounter: Secondary | ICD-10-CM | POA: Diagnosis not present

## 2015-08-14 DIAGNOSIS — I4891 Unspecified atrial fibrillation: Secondary | ICD-10-CM | POA: Insufficient documentation

## 2015-08-14 DIAGNOSIS — Z87891 Personal history of nicotine dependence: Secondary | ICD-10-CM | POA: Diagnosis not present

## 2015-08-14 DIAGNOSIS — S6991XA Unspecified injury of right wrist, hand and finger(s), initial encounter: Secondary | ICD-10-CM | POA: Diagnosis not present

## 2015-08-14 DIAGNOSIS — S6992XA Unspecified injury of left wrist, hand and finger(s), initial encounter: Secondary | ICD-10-CM | POA: Diagnosis not present

## 2015-08-14 DIAGNOSIS — M25511 Pain in right shoulder: Secondary | ICD-10-CM | POA: Diagnosis not present

## 2015-08-14 DIAGNOSIS — Y999 Unspecified external cause status: Secondary | ICD-10-CM | POA: Diagnosis not present

## 2015-08-14 DIAGNOSIS — S0990XA Unspecified injury of head, initial encounter: Secondary | ICD-10-CM | POA: Diagnosis not present

## 2015-08-14 DIAGNOSIS — S0083XA Contusion of other part of head, initial encounter: Secondary | ICD-10-CM | POA: Insufficient documentation

## 2015-08-14 DIAGNOSIS — Z7984 Long term (current) use of oral hypoglycemic drugs: Secondary | ICD-10-CM | POA: Insufficient documentation

## 2015-08-14 DIAGNOSIS — Z7951 Long term (current) use of inhaled steroids: Secondary | ICD-10-CM | POA: Insufficient documentation

## 2015-08-14 DIAGNOSIS — T07 Unspecified multiple injuries: Secondary | ICD-10-CM | POA: Diagnosis not present

## 2015-08-14 DIAGNOSIS — S299XXA Unspecified injury of thorax, initial encounter: Secondary | ICD-10-CM | POA: Diagnosis not present

## 2015-08-14 DIAGNOSIS — J449 Chronic obstructive pulmonary disease, unspecified: Secondary | ICD-10-CM | POA: Diagnosis not present

## 2015-08-14 DIAGNOSIS — R079 Chest pain, unspecified: Secondary | ICD-10-CM | POA: Diagnosis not present

## 2015-08-14 DIAGNOSIS — Y929 Unspecified place or not applicable: Secondary | ICD-10-CM | POA: Diagnosis not present

## 2015-08-14 DIAGNOSIS — Y9301 Activity, walking, marching and hiking: Secondary | ICD-10-CM | POA: Insufficient documentation

## 2015-08-14 DIAGNOSIS — S60512A Abrasion of left hand, initial encounter: Secondary | ICD-10-CM | POA: Insufficient documentation

## 2015-08-14 DIAGNOSIS — W19XXXA Unspecified fall, initial encounter: Secondary | ICD-10-CM

## 2015-08-14 DIAGNOSIS — S098XXA Other specified injuries of head, initial encounter: Secondary | ICD-10-CM | POA: Diagnosis not present

## 2015-08-14 DIAGNOSIS — S199XXA Unspecified injury of neck, initial encounter: Secondary | ICD-10-CM | POA: Diagnosis not present

## 2015-08-14 DIAGNOSIS — R0781 Pleurodynia: Secondary | ICD-10-CM | POA: Diagnosis not present

## 2015-08-14 DIAGNOSIS — S0081XA Abrasion of other part of head, initial encounter: Secondary | ICD-10-CM | POA: Diagnosis not present

## 2015-08-14 LAB — I-STAT CHEM 8, ED
BUN: 28 mg/dL — ABNORMAL HIGH (ref 6–20)
Calcium, Ion: 1.13 mmol/L (ref 1.12–1.23)
Chloride: 104 mmol/L (ref 101–111)
Creatinine, Ser: 1.3 mg/dL — ABNORMAL HIGH (ref 0.61–1.24)
Glucose, Bld: 201 mg/dL — ABNORMAL HIGH (ref 65–99)
HEMATOCRIT: 30 % — AB (ref 39.0–52.0)
HEMOGLOBIN: 10.2 g/dL — AB (ref 13.0–17.0)
POTASSIUM: 4.5 mmol/L (ref 3.5–5.1)
SODIUM: 140 mmol/L (ref 135–145)
TCO2: 26 mmol/L (ref 0–100)

## 2015-08-14 MED ORDER — HYDROCODONE-ACETAMINOPHEN 5-325 MG PO TABS
1.0000 | ORAL_TABLET | Freq: Once | ORAL | Status: AC
  Administered 2015-08-14: 1 via ORAL
  Filled 2015-08-14: qty 1

## 2015-08-14 MED ORDER — HYDROCODONE-ACETAMINOPHEN 5-325 MG PO TABS: 1.0000 | ORAL_TABLET | Freq: Four times a day (QID) | ORAL | Status: DC | PRN

## 2015-08-14 NOTE — ED Notes (Addendum)
Per EMS, Pt, from home, c/o R shoulder, bilateral hands, anterior R side forehead laceration, and abrasions to knees r/t trip and fall this afternoon.  Pain score 8/10.  Pt reports he was walking his dog and sts "it jumped out" causing the fall.  Pt is on blood thinners.  Bleeding controlled.  Denies LOC.  Pt at neuro baseline.

## 2015-08-14 NOTE — ED Notes (Signed)
Patient transported to X-ray 

## 2015-08-14 NOTE — ED Provider Notes (Signed)
CSN: [id number]     Arrival date & time 08/14/15  1757 History   First MD Initiated Contact with Patient 08/14/15 1817     Chief Complaint  Patient presents with  . Fall  . Head Laceration  . Shoulder Pain     (Consider location/radiation/quality/duration/timing/severity/associated sxs/prior Treatment) Patient is a 82 y.o. male presenting with fall, scalp laceration, and shoulder pain. The history is provided by the patient (The patient states that he tripped while he was walking his dog hit his head no loss consciousness also hit his chest).  Fall This is a new problem. The current episode started 1 to 2 hours ago. The problem occurs rarely. The problem has been resolved. Associated symptoms include chest pain. Pertinent negatives include no abdominal pain and no headaches. Nothing aggravates the symptoms. Nothing relieves the symptoms.  Head Laceration Associated symptoms include chest pain. Pertinent negatives include no abdominal pain and no headaches.  Shoulder Pain Associated symptoms: no back pain and no fatigue     Past Medical History  Diagnosis Date  . COPD (chronic obstructive pulmonary disease) ([place])   . Atrial fibrillation ([place]) 1991    with multiple DCCV  . Type 2 diabetes mellitus ([place])   . Bipolar affective disorder ([place])   . Vertigo   . Anemia   . Gout   . Fatigue     last year or so  . OSA (obstructive sleep apnea)     severe, uses bipap 16 1/2 by 12 or 13 setting  . On home oxygen therapy 2 1/2 liters with bipap at night  . Hypertension   . Dyslipidemia   . Anxiety   . Insomnia   . Allergic rhinitis   . Obesity   . Diabetic neuropathy ([place])     in feet  . Aortic stenosis 2012    mild to mod    Past Surgical History  Procedure Laterality Date  . [place]  1998  . Knee surgery  1970's    rt  . Shoulder surgery  7-8 yrs ago    rt  . Cardioversion  yrs ago  . Electro shock  1969    for depression  . Colonoscopy with propofol N/A 01/23/2013   Procedure: COLONOSCOPY WITH PROPOFOL;  Surgeon: Garlan Fair, MD;  Location: [facility];  Service: Endoscopy;  Laterality: N/A;  . Esophagogastroduodenoscopy (egd) with propofol N/A 01/23/2013    Procedure: ESOPHAGOGASTRODUODENOSCOPY (EGD) WITH PROPOFOL;  Surgeon: Garlan Fair, MD;  Location: [facility];  Service: Endoscopy;  Laterality: N/A;   Family History  Problem Relation Age of Onset  . Tuberculosis Maternal Grandmother   . Heart attack Neg Hx   . Diabetes Neg Hx   . CAD Neg Hx   . Cancer Neg Hx    Social History  Substance Use Topics  . Smoking status: Former Smoker -- 2.00 packs/day for 25 years    Types: Cigarettes    Quit date: 07/08/1976  . Smokeless tobacco: Never Used  . Alcohol Use: No     Comment: in AA since 1977, no alcohol since 1977    Review of Systems  Constitutional: Negative for appetite change and fatigue.  HENT: Negative for congestion, ear discharge and sinus pressure.   Eyes: Negative for discharge.  Respiratory: Negative for cough.   Cardiovascular: Positive for chest pain.  Gastrointestinal: Negative for abdominal pain and diarrhea.  Genitourinary: Negative for frequency and hematuria.  Musculoskeletal: Negative for back pain.  Patient has pain in both hands  Skin: Negative for rash.  Neurological: Negative for seizures and headaches.  Psychiatric/Behavioral: Negative for hallucinations.      Allergies  Antihistamines, loratadine-type; Meclizine; and Other  Home Medications   Prior to Admission medications   Medication Sig Start Date End Date Taking? Authorizing Provider  allopurinol (ZYLOPRIM) 100 MG tablet Take 100 mg by mouth daily.    Yes Historical Provider, MD  citalopram (CELEXA) 40 MG tablet Take 40 mg by mouth every morning. 06/19/15  Yes Historical Provider, MD  diltiazem (DILACOR XR) 120 MG 24 hr capsule Take 120 mg by mouth at bedtime.   Yes Historical Provider, MD  fluticasone (FLONASE) 50 MCG/ACT nasal spray  Place 1 spray into both nostrils 2 (two) times daily.   Yes Historical Provider, MD  furosemide (LASIX) 80 MG tablet  07/16/15  Yes Historical Provider, MD  iron polysaccharides (NIFEREX) 150 MG capsule Take 1 capsule (150 mg total) by mouth 2 (two) times daily. 10/23/14  Yes Eugenie Filler, MD  JANUVIA 100 MG tablet Take 1 tablet by mouth daily. 06/15/15  Yes Historical Provider, MD  lovastatin (MEVACOR) 20 MG tablet Take 20 mg by mouth every morning.    Yes Historical Provider, MD  metFORMIN (GLUCOPHAGE) 1000 MG tablet Take 1,000 mg by mouth 2 (two) times daily.  05/17/14  Yes Historical Provider, MD  polyethylene glycol (MIRALAX / GLYCOLAX) packet Take 17 g by mouth 2 (two) times daily. Patient taking differently: Take 17 g by mouth daily.  10/23/14  Yes Eugenie Filler, MD  potassium chloride SA (K-DUR,KLOR-CON) 20 MEQ tablet Take 1 tablet by mouth daily. 09/10/14  Yes Historical Provider, MD  SYMBICORT 160-4.5 MCG/ACT inhaler Inhale 2 puffs into the lungs 2 (two) times daily. 07/07/15  Yes Historical Provider, MD  temazepam (RESTORIL) 15 MG capsule Take 1 capsule by mouth at bedtime.  04/07/14  Yes Historical Provider, MD  Tiotropium Bromide-Olodaterol (STIOLTO RESPIMAT) 2.5-2.5 MCG/ACT AERS Inhale 2 puffs into the lungs daily. 04/19/14  Yes Deneise Lever, MD  valsartan (DIOVAN) 160 MG tablet Take 160 mg by mouth every morning.    Yes Historical Provider, MD  warfarin (COUMADIN) 5 MG tablet Take 5 mg by mouth every evening.    Yes Historical Provider, MD  HYDROcodone-acetaminophen (NORCO/VICODIN) 5-325 MG tablet Take 1 tablet by mouth every 6 (six) hours as needed for moderate pain. 08/14/15   Milton Ferguson, MD  senna-docusate (SENOKOT-S) 8.6-50 MG per tablet Take 1 tablet by mouth at bedtime. Patient not taking: Reported on 08/14/2015 10/23/14   Eugenie Filler, MD   BP 121/53 mmHg  Pulse 73  Temp(Src) 98.2 F (36.8 C) (Oral)  Resp 16  SpO2 97% Physical Exam  Constitutional: He is oriented  to person, place, and time. He appears well-developed.  HENT:  Head: Normocephalic.  Abrasion to forehead  Eyes: Conjunctivae and EOM are normal. No scleral icterus.  Neck: Neck supple. No thyromegaly present.  Cardiovascular: Normal rate and regular rhythm.  Exam reveals no gallop and no friction rub.   No murmur heard. Pulmonary/Chest: No stridor. He has no wheezes. He has no rales. He exhibits tenderness.  Tender right chest  Abdominal: He exhibits no distension. There is no tenderness. There is no rebound.  Musculoskeletal: Normal range of motion. He exhibits no edema.  Both hands with abrasions and tenderness to thumbs  Lymphadenopathy:    He has no cervical adenopathy.  Neurological: He is oriented to person, place, and  time. He exhibits normal muscle tone. Coordination normal.  Skin: No rash noted. No erythema.  Psychiatric: He has a normal mood and affect. His behavior is normal.    ED Course  Procedures (including critical care time) Labs Review Labs Reviewed  I-STAT CHEM 8, ED - Abnormal; Notable for the following:    BUN 28 (*)    Creatinine, Ser 1.30 (*)    Glucose, Bld 201 (*)    Hemoglobin 10.2 (*)    HCT 30.0 (*)    All other components within normal limits    Imaging Review Dg Ribs Unilateral W/chest Right  08/14/2015  CLINICAL DATA:  82 year old male with fall and right upper anterior rib pain. EXAM: RIGHT RIBS AND CHEST - 3+ VIEW COMPARISON:  Chest radiograph dated 06/30/2015 FINDINGS: Two views of the chest demonstrate minimal bibasilar atelectatic changes. There is no focal consolidation, pleural effusion, or pneumothorax. Stable mild cardiomegaly. Median sternotomy wires. The degenerative changes of the spine and shoulders. No acute rib fracture. IMPRESSION: No acute rib fracture.  No pneumothorax. Electronically Signed   By: Anner Crete M.D.   On: 08/14/2015 19:18   Ct Head Wo Contrast  08/14/2015  CLINICAL DATA:  Per EMS, Pt, from home, c/o R  shoulder, bilateral hands, anterior R side forehead laceration, and abrasions to knees r/t trip and fall this afternoon. Pain score 8/10. Pt reports he was walking his dog and sts "it jumped out" causing the fall. Pt is on blood thinners. Bleeding controlled. Denies LOC. Pt at neuro baseline EXAM: CT HEAD WITHOUT CONTRAST CT CERVICAL SPINE WITHOUT CONTRAST TECHNIQUE: Multidetector CT imaging of the head and cervical spine was performed following the standard protocol without intravenous contrast. Multiplanar CT image reconstructions of the cervical spine were also generated. COMPARISON:  Brain MRI, 11/07/2009 FINDINGS: CT HEAD FINDINGS The ventricles are normal configuration. There is ventricular and sulcal enlargement reflecting age-appropriate volume loss. There are no parenchymal masses or mass effect. There is no evidence of a cortical infarct. There are no extra-axial masses or abnormal fluid collections. There is no intracranial hemorrhage. The visualized sinuses and mastoid air cells are clear. CT CERVICAL SPINE FINDINGS No fracture.  No spondylolisthesis. Mild loss of disc height at C3-C4, C5-C6 and C6-C7. Endplate spurring is noted most evident at C3-C4. There are facet degenerative changes which are more prominent than it disc degenerative changes. This is noted bilaterally throughout the cervical spine. A combination of uncovertebral and facet spurring causes moderate neural foraminal narrowing on the left at C4-C5. Bones are demineralized. There are carotid vascular calcifications most evident at the bifurcations. No soft tissue mass for adenopathy. Lung apices are clear. IMPRESSION: HEAD CT:  No acute intracranial abnormalities.  No skull fracture. CERVICAL CT:  No fracture or acute finding. Electronically Signed   By: Lajean Manes M.D.   On: 08/14/2015 20:17   Ct Cervical Spine Wo Contrast  08/14/2015  CLINICAL DATA:  Per EMS, Pt, from home, c/o R shoulder, bilateral hands, anterior R side forehead  laceration, and abrasions to knees r/t trip and fall this afternoon. Pain score 8/10. Pt reports he was walking his dog and sts "it jumped out" causing the fall. Pt is on blood thinners. Bleeding controlled. Denies LOC. Pt at neuro baseline EXAM: CT HEAD WITHOUT CONTRAST CT CERVICAL SPINE WITHOUT CONTRAST TECHNIQUE: Multidetector CT imaging of the head and cervical spine was performed following the standard protocol without intravenous contrast. Multiplanar CT image reconstructions of the cervical spine were also generated.  COMPARISON:  Brain MRI, 11/07/2009 FINDINGS: CT HEAD FINDINGS The ventricles are normal configuration. There is ventricular and sulcal enlargement reflecting age-appropriate volume loss. There are no parenchymal masses or mass effect. There is no evidence of a cortical infarct. There are no extra-axial masses or abnormal fluid collections. There is no intracranial hemorrhage. The visualized sinuses and mastoid air cells are clear. CT CERVICAL SPINE FINDINGS No fracture.  No spondylolisthesis. Mild loss of disc height at C3-C4, C5-C6 and C6-C7. Endplate spurring is noted most evident at C3-C4. There are facet degenerative changes which are more prominent than it disc degenerative changes. This is noted bilaterally throughout the cervical spine. A combination of uncovertebral and facet spurring causes moderate neural foraminal narrowing on the left at C4-C5. Bones are demineralized. There are carotid vascular calcifications most evident at the bifurcations. No soft tissue mass for adenopathy. Lung apices are clear. IMPRESSION: HEAD CT:  No acute intracranial abnormalities.  No skull fracture. CERVICAL CT:  No fracture or acute finding. Electronically Signed   By: Lajean Manes M.D.   On: 08/14/2015 20:17   Dg Hand Complete Left  08/14/2015  CLINICAL DATA:  Pt states his dog pulled while walking him and caused him to fall on concrete. Pt c/o right upper anterior rib pain, BB placed. Right hand  pain along 5th finger and metacarpal. Left hand pain through thumb and 1st metacarpal. COPD. Hx A-fib. Diabetic. Hx HTN. Former smoker. Hx heart surgery. EXAM: LEFT HAND - COMPLETE 3+ VIEW COMPARISON:  None. FINDINGS: No fracture.  No dislocation. There are osteoarthritic changes at the IP joint of the thumb and DIP joints of the remaining fingers. Vascular calcifications are seen mostly along the radial artery. IMPRESSION: 1. No fracture or dislocation. Electronically Signed   By: Lajean Manes M.D.   On: 08/14/2015 19:16   Dg Hand Complete Right  08/14/2015  CLINICAL DATA:  Pt states his dog pulled while walking him and caused him to fall on concrete. Pt c/o right upper anterior rib pain, BB placed. Right hand pain along 5th finger and metacarpal. Left hand pain through thumb and 1st metacarpal. COPD. Hx A-fib. Diabetic. Hx HTN. Former smoker. Hx heart surgery. EXAM: RIGHT HAND - COMPLETE 3+ VIEW COMPARISON:  None. FINDINGS: No fracture.  No dislocation. There are osteoarthritic changes most evident of the IP joint of the thumb and DIP joints of the fingers. There are vascular calcifications along the wrist and hand arteries. IMPRESSION: No fracture or dislocation. Electronically Signed   By: Lajean Manes M.D.   On: 08/14/2015 19:18   I have personally reviewed and evaluated these images and lab results as part of my medical decision-making.   EKG Interpretation None      MDM   Final diagnoses:  Fall, initial encounter    CT the head negative chest x-ray negative and x-rays negative. Patient with a fall. Contusion to forehead both hands and right chest. Patient given some pain medicine and will follow-up with his PCP if needed    Milton Ferguson, MD 08/14/15 2104

## 2015-08-14 NOTE — ED Notes (Signed)
Bed: [id number] Expected date: 08/14/15 Expected time:  Means of arrival:  Comments: EMS- leg pain from [facility]

## 2015-08-14 NOTE — Discharge Instructions (Signed)
Follow up with your md next week for recheck °

## 2015-08-14 NOTE — ED Notes (Signed)
Bed: [id number] Expected date:  Expected time:  Means of arrival:  Comments: EMS- 82yo M, fall/multiple complaints/blood thinners

## 2015-08-14 NOTE — ED Notes (Signed)
MD at bedside. 

## 2015-08-26 DIAGNOSIS — Z7901 Long term (current) use of anticoagulants: Secondary | ICD-10-CM | POA: Diagnosis not present

## 2015-08-26 DIAGNOSIS — E291 Testicular hypofunction: Secondary | ICD-10-CM | POA: Diagnosis not present

## 2015-09-01 DIAGNOSIS — Z7901 Long term (current) use of anticoagulants: Secondary | ICD-10-CM | POA: Diagnosis not present

## 2015-09-08 DIAGNOSIS — Z7901 Long term (current) use of anticoagulants: Secondary | ICD-10-CM | POA: Diagnosis not present

## 2015-09-09 DIAGNOSIS — N529 Male erectile dysfunction, unspecified: Secondary | ICD-10-CM | POA: Diagnosis not present

## 2015-09-09 DIAGNOSIS — F3341 Major depressive disorder, recurrent, in partial remission: Secondary | ICD-10-CM | POA: Diagnosis not present

## 2015-09-09 DIAGNOSIS — E291 Testicular hypofunction: Secondary | ICD-10-CM | POA: Diagnosis not present

## 2015-09-09 DIAGNOSIS — E1142 Type 2 diabetes mellitus with diabetic polyneuropathy: Secondary | ICD-10-CM | POA: Diagnosis not present

## 2015-09-09 DIAGNOSIS — Z7984 Long term (current) use of oral hypoglycemic drugs: Secondary | ICD-10-CM | POA: Diagnosis not present

## 2015-09-15 DIAGNOSIS — Z7901 Long term (current) use of anticoagulants: Secondary | ICD-10-CM | POA: Diagnosis not present

## 2015-09-17 DIAGNOSIS — E119 Type 2 diabetes mellitus without complications: Secondary | ICD-10-CM | POA: Diagnosis not present

## 2015-09-17 DIAGNOSIS — Z961 Presence of intraocular lens: Secondary | ICD-10-CM | POA: Diagnosis not present

## 2015-09-17 DIAGNOSIS — H43813 Vitreous degeneration, bilateral: Secondary | ICD-10-CM | POA: Diagnosis not present

## 2015-09-17 DIAGNOSIS — H353132 Nonexudative age-related macular degeneration, bilateral, intermediate dry stage: Secondary | ICD-10-CM | POA: Diagnosis not present

## 2015-09-22 DIAGNOSIS — Z7901 Long term (current) use of anticoagulants: Secondary | ICD-10-CM | POA: Diagnosis not present

## 2015-09-25 DIAGNOSIS — M1711 Unilateral primary osteoarthritis, right knee: Secondary | ICD-10-CM | POA: Diagnosis not present

## 2015-09-25 DIAGNOSIS — M1712 Unilateral primary osteoarthritis, left knee: Secondary | ICD-10-CM | POA: Diagnosis not present

## 2015-10-08 DIAGNOSIS — Z7901 Long term (current) use of anticoagulants: Secondary | ICD-10-CM | POA: Diagnosis not present

## 2015-10-09 DIAGNOSIS — E291 Testicular hypofunction: Secondary | ICD-10-CM | POA: Diagnosis not present

## 2015-10-14 ENCOUNTER — Ambulatory Visit (INDEPENDENT_AMBULATORY_CARE_PROVIDER_SITE_OTHER)
Admission: RE | Admit: 2015-10-14 | Discharge: 2015-10-14 | Disposition: A | Discharge status: Home / Self Care | Payer: PPO | Source: Ambulatory Visit | Attending: Internal Medicine | Admitting: Internal Medicine

## 2015-10-14 ENCOUNTER — Ambulatory Visit (INDEPENDENT_AMBULATORY_CARE_PROVIDER_SITE_OTHER): Payer: PPO | Admitting: Internal Medicine

## 2015-10-14 ENCOUNTER — Encounter: Payer: Self-pay | Admitting: Internal Medicine

## 2015-10-14 VITALS — BP 120/78 | HR 64 | Ht 72.5 in | Wt 238.2 lb

## 2015-10-14 DIAGNOSIS — J9621 Acute and chronic respiratory failure with hypoxia: Secondary | ICD-10-CM

## 2015-10-14 DIAGNOSIS — R911 Solitary pulmonary nodule: Secondary | ICD-10-CM | POA: Diagnosis not present

## 2015-10-14 DIAGNOSIS — I482 Chronic atrial fibrillation, unspecified: Secondary | ICD-10-CM

## 2015-10-14 DIAGNOSIS — G4733 Obstructive sleep apnea (adult) (pediatric): Secondary | ICD-10-CM

## 2015-10-14 DIAGNOSIS — J449 Chronic obstructive pulmonary disease, unspecified: Secondary | ICD-10-CM

## 2015-10-14 DIAGNOSIS — R0609 Other forms of dyspnea: Secondary | ICD-10-CM

## 2015-10-14 DIAGNOSIS — R918 Other nonspecific abnormal finding of lung field: Secondary | ICD-10-CM | POA: Diagnosis not present

## 2015-10-14 NOTE — Patient Instructions (Signed)
Flu vax  Order- CXR   Dx COPD, lung nodule  Order- Office spirometry    Dx COPD  Order-  DME Advanced  Please evaluate eligibility for replacement of old BIPAP machine 18/ 39, O2 2L sleep, mask of choice, humidifier, supplies, AirView    Dx OSA, chronic hypoxic respiratory failure  Please call as needed

## 2015-10-14 NOTE — Assessment & Plan Note (Signed)
No active bronchitis symptoms currently with no recent wheeze or routine cough. Clinically improved with no acute intervention required. Plan-flu shot

## 2015-10-14 NOTE — Assessment & Plan Note (Signed)
Pulses nearly regular to palpation. Followed by cardiology

## 2015-10-14 NOTE — Progress Notes (Signed)
Subjective:    Patient ID: Joel [name], male    DOB: 08-Jul-1932, 82 y.o.   MRN: [id number]  HPI  03/13/13- 35 yoM former smoker followed for OSA, chronic bronchitis, allergic rhinitis, complicated by DM, AFib, anemia, heart murmur FOLLOWS FOR: Wears BiPAP 18/14 w O2 2L/ Advanced every night for about 8-9 hours; pressure works well for patient. Says these really help.   Dr. Tamala Julian has seen for heart murmur. Dr Deforest Hoyles has seen for anemia treated with iron. Endoscopy negative. Echo 11/21/12- mild PHTN, Mild -mod AS, nl EF PFT 11/29/2012-mild obstructive airways disease with response to bronchodilator, FVC 3.45/77%, FEV1 2.43/75%, FEV1/FVC 0.70, FEF 25-75% 1.98/87%. TLC 81%, DLCO 61%.  07/10/13- 58 yoM former smoker followed for OSA, chronic bronchitis, allergic rhinitis, complicated by DM, AFib, anemia, heart murmur FOLLOWS FOR: Pt states he is wearing BiPAP 18/14 w O2 2L/ Advanced nightly for 8-9 hours/night.  Pt states he is not sleeping well d/t wife being in nursing facility and stress, c/o daytime fatigue. Pt denies issues with machine, pressure and mask. Pt fell asleep during work-up.  Using one half zolpidem tablet which isn't quite enough, but a whole tablet causes nightmares  04/19/14- 80 yoM former smoker followed for OSA, chronic bronchitis, allergic rhinitis, complicated by DM,CHF/ PHTN AFib, anemia, heart murmur ACUTE VISIT: Increased SOB x several months; getting worse with activity. He is wearing BiPAP 18/14 w O2 2L/ Advanced nightly for 8-9 hours/night He reports very limited walking capability due to arthritis in his knees. His home oximeter recorded saturation 92% or better even when he is short of breath. NM heart study 08/2013-- EF 48% w wall motion abnormalities CXR 08/04/13 IMPRESSION: Stable cardiomegaly. COPD/emphysema. Stable scarring in the lower lobes. No acute cardiopulmonary disease. Electronically Signed  By: Evangeline Dakin M.D.  On: 08/04/2013  13:42  10/14/2015-82 year old male former smoker followed for OSA, chronic bronchitis, allergic rhinitis, complicated by DM, A. Fib, CM/CHF/PHTN, anemia, heart murmur, obesity BiPAP 18/14, O2 2 L/Advanced for sleep FOLLOWS FOR: DME:[facility]. Pt was told to have face to face today for new BiPAP machine. Pt wears BiPAP every night for about 6.5 -8 hour. No DL or AV. He has been very comfortable with BiPAP and feels dependent on using it every night. Machine now is old. He sleeps alone with no reports of snoring. Naps in chair if needed. Continues oxygen for sleep. Little cough or wheeze. Asks update about his breathing. Dyspnea on exertion with hills and stairs. Flonase continues very helpful for his allergic rhinitis. Office Spirometry 10/14/2015-mild obstructive airways disease. FVC 3.56/80%, FEV1 2.38/75%, ratio 0.67, FEF 25-75 percent 1.25/56%  ROS-see HPI Constitutional:   + deliberate weight loss, night sweats, fevers, chills, fatigue, lassitude. HEENT:   No-  headaches, difficulty swallowing, tooth/dental problems, sore throat,       No-  sneezing, itching, ear ache, nasal congestion, post nasal drip,  CV:  No-   chest pain, orthopnea, PND, swelling in lower extremities, anasarca,  dizziness, palpitations Resp: +  shortness of breath with exertion or at rest.              No-   productive cough,  No non-productive cough,  No- coughing up of blood.              No-   change in color of mucus.  No- wheezing.   Skin: No-   rash or lesions. GI:  No-   heartburn, indigestion, abdominal pain, nausea, vomiting,  GU: . MS:  No-  joint pain or swelling.  . Neuro-     nothing unusual Psych:  No- change in mood or affect. No depression or anxiety.  No memory loss.    Objective:  OBJ- Physical Exam General- Alert, Oriented, Affect-appropriate, Distress- none acute,  +obese but has lost weight Skin- rash-none, lesions- none, excoriation- none Lymphadenopathy- none Head- atraumatic            Eyes-  Gross vision intact, PERRLA, conjunctivae and secretions clear            Ears- Hearing, canals-normal            Nose- Clear, no-Septal dev, mucus, polyps, erosion, perforation             Throat- Mallampati II , mucosa clear , drainage- none, tonsils- atrophic, +dentures Neck- flexible , trachea midline, no stridor , thyroid nl, carotid no bruit Chest - symmetrical excursion , unlabored           Heart/CV- RRR , +1/6 AS murmur , no gallop  , no rub, nl s1 s2                           - JVD- none , edema-none, stasis changes- none, varices- none           Lung- clear to P&A, wheeze- none, cough- none , dullness-none, rub- none           Chest wall-  Abd- +  abdominal obesity Br/ Gen/ Rectal- Not done, not indicated Extrem- cyanosis- none, clubbing, none, atrophy- none, strength- nl Neuro- grossly intact to observation  Assessment & Plan:

## 2015-10-14 NOTE — Assessment & Plan Note (Signed)
He describes excellent compliance and control. We do need download for documentation. Pressures seem comfortable for him and he definitely sleeps better and feels better rested with BiPAP. Plan-download and evaluate eligibility for replacement machine.

## 2015-10-14 NOTE — Assessment & Plan Note (Signed)
Dyspnea on exertion is multifactorial. Role of mild lung disease. Cardiac component with CAD, aortic stenosis, controlled atrial fibrillation, pulmonary hypertension. Also obesity has improved.

## 2015-10-16 DIAGNOSIS — R972 Elevated prostate specific antigen [PSA]: Secondary | ICD-10-CM | POA: Diagnosis not present

## 2015-10-16 DIAGNOSIS — N400 Enlarged prostate without lower urinary tract symptoms: Secondary | ICD-10-CM | POA: Diagnosis not present

## 2015-10-17 ENCOUNTER — Other Ambulatory Visit: Payer: Self-pay | Admitting: Internal Medicine

## 2015-10-17 DIAGNOSIS — R911 Solitary pulmonary nodule: Secondary | ICD-10-CM

## 2015-10-17 DIAGNOSIS — G4733 Obstructive sleep apnea (adult) (pediatric): Secondary | ICD-10-CM | POA: Diagnosis not present

## 2015-10-17 DIAGNOSIS — J410 Simple chronic bronchitis: Secondary | ICD-10-CM | POA: Diagnosis not present

## 2015-10-17 DIAGNOSIS — E662 Morbid (severe) obesity with alveolar hypoventilation: Secondary | ICD-10-CM | POA: Diagnosis not present

## 2015-10-22 ENCOUNTER — Telehealth: Payer: Self-pay | Admitting: Internal Medicine

## 2015-10-22 DIAGNOSIS — G4733 Obstructive sleep apnea (adult) (pediatric): Secondary | ICD-10-CM

## 2015-10-22 NOTE — Telephone Encounter (Signed)
lmomtcb x 2  

## 2015-10-22 NOTE — Telephone Encounter (Signed)
LM x 1 

## 2015-10-22 NOTE — Telephone Encounter (Signed)
Patient returning call -he can be reached at [phone]

## 2015-10-23 ENCOUNTER — Encounter: Payer: Self-pay | Admitting: Internal Medicine

## 2015-10-23 NOTE — Telephone Encounter (Signed)
lmtcb for pt.  

## 2015-10-23 NOTE — Telephone Encounter (Signed)
Message given to Parke Poisson to address with patient.

## 2015-10-23 NOTE — Progress Notes (Signed)
Cardiology Office Note   Date:  10/24/2015   ID:  Joel [name], DOB 1933-01-01, MRN [id number]  PCP:  Wenda Low, MD  Cardiologist:   Minus Breeding, MD   Chief Complaint  Patient presents with  . Leg Swelling      History of Present Illness: Joel [name] is a 82 y.o. male who presents for evaluation of atrial fibrillation and aortic stenosis. He has long-standing shortness of breath but he also has chronic lung disease. He did have an evaluation in 2015 that included a low risk perfusion study with probable artifact. He also had a BNP level was only slightly over 100. It was felt that his breathing difficulties were most likely pulmonary. He wears oxygen at night but he doesn't carry this with him usually. He had an echo in May with moderate AS. Of note he's had fibrillation with a Cox-Maze procedure cardioversions but has been intermittently in atrial fibrillation.   He reports that his legs have been more swollen than usual even though he's lost over 30 pounds in the past year. He reports other problems such as nosebleeds and had trouble getting his regulated. He is having to have higher levels on his BiPAP changes exacerbating his nosebleeds.  He has chronic dyspnea.  He denies any new PND or orthopnea. He doesn't really feel palpitations, presyncope or syncope. He's not having any chest pressure, neck or arm discomfort.  He is limited by back pain and knee pain.   Past Medical History:  Diagnosis Date  . Allergic rhinitis   . Anemia   . Anxiety   . Aortic stenosis 2012   mild to mod   . Atrial fibrillation ([place]) 1991   with multiple DCCV  . Bipolar affective disorder ([place])   . COPD (chronic obstructive pulmonary disease) ([place])   . Diabetic neuropathy ([place])    in feet  . Dyslipidemia   . Fatigue    last year or so  . Gout   . Hypertension   . Insomnia   . Obesity   . On home oxygen therapy 2 1/2 liters with bipap at night  . OSA (obstructive sleep apnea)    severe, uses bipap 16 1/2 by 12 or 13 setting  . Type 2 diabetes mellitus ([place])   . Vertigo     Past Surgical History:  Procedure Laterality Date  . CARDIOVERSION  yrs ago  . COLONOSCOPY WITH PROPOFOL N/A 01/23/2013   Procedure: COLONOSCOPY WITH PROPOFOL;  Surgeon: Garlan Fair, MD;  Location: [facility];  Service: Endoscopy;  Laterality: N/A;  . electro shock  1969   for depression  . ESOPHAGOGASTRODUODENOSCOPY (EGD) WITH PROPOFOL N/A 01/23/2013   Procedure: ESOPHAGOGASTRODUODENOSCOPY (EGD) WITH PROPOFOL;  Surgeon: Garlan Fair, MD;  Location: [facility];  Service: Endoscopy;  Laterality: N/A;  . KNEE SURGERY  1970's   rt  . MAZE  1998  . SHOULDER SURGERY  7-8 yrs ago   rt     Current Outpatient Prescriptions  Medication Sig Dispense Refill  . allopurinol (ZYLOPRIM) 100 MG tablet Take 100 mg by mouth daily.     Marland Kitchen diltiazem (DILACOR XR) 120 MG 24 hr capsule Take 120 mg by mouth at bedtime.    . iron polysaccharides (NIFEREX) 150 MG capsule Take 1 capsule (150 mg total) by mouth 2 (two) times daily. 60 capsule 0  . JANUVIA 100 MG tablet Take 1 tablet by mouth daily.  1  . lovastatin (MEVACOR) 20 MG tablet  Take 20 mg by mouth every morning.     . metFORMIN (GLUCOPHAGE) 1000 MG tablet Take 1,000 mg by mouth 2 (two) times daily.   0  . polyethylene glycol (MIRALAX / GLYCOLAX) packet Take 17 g by mouth 2 (two) times daily. (Patient taking differently: Take 17 g by mouth daily. ) 14 each 0  . potassium chloride SA (K-DUR,KLOR-CON) 20 MEQ tablet Take 1 tablet by mouth daily.  0  . senna-docusate (SENOKOT-S) 8.6-50 MG per tablet Take 1 tablet by mouth at bedtime.    . SYMBICORT 160-4.5 MCG/ACT inhaler Inhale 2 puffs into the lungs 2 (two) times daily.  0  . temazepam (RESTORIL) 15 MG capsule Take 1 capsule by mouth at bedtime.   0  . Tiotropium Bromide-Olodaterol (STIOLTO RESPIMAT) 2.5-2.5 MCG/ACT AERS Inhale 2 puffs into the lungs daily. 1 Inhaler 0  . valsartan (DIOVAN) 160  MG tablet Take 160 mg by mouth every morning.     . warfarin (COUMADIN) 5 MG tablet Take 5 mg by mouth every evening.     . torsemide (DEMADEX) 20 MG tablet Take 2 tablets (40 mg total) by mouth daily. 60 tablet 11   No current facility-administered medications for this visit.     Allergies:   Antihistamines, loratadine-type; Meclizine; and Other    ROS:  Please see the history of present illness.   Otherwise, review of systems are positive for Nosebleeds, back pain, knee pain.   All other systems are reviewed and negative.    PHYSICAL EXAM: VS:  BP (!) 116/48   Pulse 72   Ht 6\' 1"  (1.854 m)   Wt 235 lb (106.6 kg)   BMI 31.00 kg/m  , BMI Body mass index is 31 kg/m. GENERAL:   Chronically ill appearing HEENT:  Pupils equal round and reactive, fundi not visualized, oral mucosa unremarkable, dentures NECK:  No jugular venous distention, waveform within normal limits, carotid upstroke brisk and symmetric, no bruits, no thyromegaly LUNGS:  Clear to auscultation bilaterally BACK:  No CVA tenderness CHEST:  Unremarkable HEART:  PMI not displaced or sustained,S1 and S2 within normal limits, no S3, no S4, no clicks, no rubs, 3/6 apical systolic murmur radiating out the outflow tract, no diastolic murmurs ABD:  Flat, positive bowel sounds normal in frequency in pitch, no bruits, no rebound, no guarding, no midline pulsatile mass, no hepatomegaly, no splenomegaly, obese EXT:  2 plus pulses upper, diminished DP/PT lower, moderate to severe edema left leg greater than right, no cyanosis no clubbing    EKG:  EKG is not ordered today.   Recent Labs: 06/28/2015: ALT 11; B Natriuretic Peptide 57.0; Platelets 204 08/14/2015: BUN 28; Creatinine, Ser 1.30; Hemoglobin 10.2; Potassium 4.5; Sodium 140     Wt Readings from Last 3 Encounters:  10/24/15 235 lb (106.6 kg)  10/14/15 238 lb 3.2 oz (108 kg)  07/22/15 236 lb 3.2 oz (107.1 kg)      Other studies Reviewed: Additional studies/ records  that were reviewed today include: Echo Review of the above records demonstrates:  Please see elsewhere in the note.     ASSESSMENT AND PLAN:  SOB:  I think this is multifactorial and related to his obesity and deconditioning and his lung disease. He may have some element of diastolic dysfunction.   I'm going to address this as below. Otherwise he seems to be at baseline.   MODERATE AS:  Echo in May of this year demonstrated that this was still moderate.  No change  in therapy is indicated.  ATRIAL FIB:  He tolerates rate control and anticoagulation.  Joel [name] has a CHA2DS2 - VASc score of 4 with a risk of stroke of 4%.  He will continue with warfarin. I encouraged him to see an ENT sinus nosebleeds.  EDEMA:  I'm going to switch him from Lasix to torsemide 40 mg daily. He's going to get a basic metabolic profile in 10 days.  Current medicines are reviewed at length with the patient today.  The patient does not have concerns regarding medicines.  The following changes have been made:  As above.  Labs/ tests ordered today include:   Orders Placed This Encounter  Procedures  . Basic Metabolic Panel (BMET)     Disposition:   FU with me in 4 months.      Signed, Minus Breeding, MD  10/24/2015 3:13 PM    [facility]

## 2015-10-23 NOTE — Telephone Encounter (Signed)
Called spoke with patient who was quite frustrated that he has had difficulty getting through to the office Patient became argumentative, disrespectful and unreceptive on the phone Pt advised that the numbers he attempted to call were incorrect Offered to have CY call pt - he became more agitated Informed pt I am trying to help him Finally was able to get the pressure settings that pt would like his BiPAP set at  > 16/11 Of note, there is NO documentation of this pressure setting in his chart and he was made aware of this  CY please advise if okay to change pt's BiPAP settings to 16/11 due to the symptoms he mentioned in his above e-mail (nose bleeds, unable to tolerate the high pressure) NOTE: pt DOES NOT want a call back from this office regarding this issue

## 2015-10-24 ENCOUNTER — Encounter: Payer: Self-pay | Admitting: Cardiology

## 2015-10-24 ENCOUNTER — Ambulatory Visit (INDEPENDENT_AMBULATORY_CARE_PROVIDER_SITE_OTHER): Payer: PPO | Admitting: Cardiology

## 2015-10-24 VITALS — BP 116/48 | HR 72 | Ht 73.0 in | Wt 235.0 lb

## 2015-10-24 DIAGNOSIS — I350 Nonrheumatic aortic (valve) stenosis: Secondary | ICD-10-CM | POA: Diagnosis not present

## 2015-10-24 DIAGNOSIS — I4819 Other persistent atrial fibrillation: Secondary | ICD-10-CM

## 2015-10-24 DIAGNOSIS — Z79899 Other long term (current) drug therapy: Secondary | ICD-10-CM

## 2015-10-24 DIAGNOSIS — I481 Persistent atrial fibrillation: Secondary | ICD-10-CM | POA: Diagnosis not present

## 2015-10-24 DIAGNOSIS — I5032 Chronic diastolic (congestive) heart failure: Secondary | ICD-10-CM

## 2015-10-24 DIAGNOSIS — M7989 Other specified soft tissue disorders: Secondary | ICD-10-CM

## 2015-10-24 MED ORDER — TORSEMIDE 20 MG PO TABS: 40.0000 mg | ORAL_TABLET | Freq: Every day | ORAL | 11 refills | Status: DC

## 2015-10-24 NOTE — Telephone Encounter (Signed)
LMTCB

## 2015-10-24 NOTE — Patient Instructions (Signed)
Medication Instructions:  STOP- Furosemide START- Torsemide 40 mg daily  Labwork: BMP in 10 days  Testing/Procedures: None Ordered  Follow-Up: Your physician wants you to follow-up in: 6 Months. You will receive a reminder letter in the mail two months in advance. If you don't receive a letter, please call our office to schedule the follow-up appointment.   Any Other Special Instructions Will Be Listed Below (If Applicable).     If you need a refill on your cardiac medications before your next appointment, please call your pharmacy.

## 2015-10-24 NOTE — Telephone Encounter (Signed)
Im sorry for the difficulty  Please order [facility]- At patient's request, change BIPAP to 16/ 11, PS 5, cpntinuing mask of choice, humidifier, supplies, AirView    And O2 2L for sleep    Dx OSA  Thanks CDY

## 2015-10-24 NOTE — Telephone Encounter (Signed)
Deneise Lever, MD at 10/24/2015 9:42 AM   Status: Signed    Im sorry for the difficulty  Please order DME [facility]- At patient's request, change BIPAP to 16/ 11, PS 5, cpntinuing mask of choice, humidifier, supplies, AirView    And O2 2L for sleep    Dx OSA  Thanks CDY    Rinaldo Ratel, CMA at 10/23/2015 5:43 PM   Status: Signed    Called spoke with patient who was quite frustrated that he has had difficulty getting through to the office Patient became argumentative, disrespectful and unreceptive on the phone Pt advised that the numbers he attempted to call were incorrect Offered to have CY call pt - he became more agitated Informed pt I am trying to help him Finally was able to get the pressure settings that pt would like his BiPAP set at  > 16/11 Of note, there is NO documentation of this pressure setting in his chart and he was made aware of this  CY please advise if okay to change pt's BiPAP settings to 16/11 due to the symptoms he mentioned in his above e-mail (nose bleeds, unable to tolerate the high pressure) NOTE: pt DOES NOT want a call back from this office regarding this issue

## 2015-10-27 ENCOUNTER — Telehealth: Payer: Self-pay | Admitting: Internal Medicine

## 2015-10-27 NOTE — Telephone Encounter (Signed)
Spoke with Melissa at [facility]. States that they have received this order and are processing it. States that it will take a few more days before settings can be processed.  Spoke with pt. I have made him aware of this and he was very upset. Advised him that [facility] is going to get this taken care of as soon as possible. Pt was very agitated and upset. Before I hung up with him, he apologized for his behavior and language. Advised him to call us back if he has any further problems or concerns.

## 2015-11-12 ENCOUNTER — Telehealth: Payer: Self-pay | Admitting: Cardiology

## 2015-11-12 NOTE — Telephone Encounter (Signed)
FORWARD TO DR Mazeppa

## 2015-11-12 NOTE — Telephone Encounter (Signed)
New message    Request for surgical clearance:  What type of surgery is being performed? Prostate biopsy  1. When is this surgery scheduled? 10.19.17  2. Are there any medications that need to be held prior to surgery and how long?  Warfarin  7 days prior    3. Name of physician performing surgery? Dr. Arman Bogus    4. What is your office phone and fax number? [phone] ext [phone]. fax [phone]

## 2015-11-13 DIAGNOSIS — E291 Testicular hypofunction: Secondary | ICD-10-CM | POA: Diagnosis not present

## 2015-11-13 DIAGNOSIS — Z7901 Long term (current) use of anticoagulants: Secondary | ICD-10-CM | POA: Diagnosis not present

## 2015-11-13 NOTE — Telephone Encounter (Signed)
Clearance faxed to Dr Karsten Ro office via [employer]

## 2015-11-13 NOTE — Telephone Encounter (Signed)
OK to hold warfarin for five days prior to prostate biopsy.  No bridging is necessary.

## 2015-11-16 DIAGNOSIS — J410 Simple chronic bronchitis: Secondary | ICD-10-CM | POA: Diagnosis not present

## 2015-11-16 DIAGNOSIS — G4733 Obstructive sleep apnea (adult) (pediatric): Secondary | ICD-10-CM | POA: Diagnosis not present

## 2015-11-16 DIAGNOSIS — E662 Morbid (severe) obesity with alveolar hypoventilation: Secondary | ICD-10-CM | POA: Diagnosis not present

## 2015-11-17 ENCOUNTER — Encounter: Payer: Self-pay | Admitting: Internal Medicine

## 2015-11-19 ENCOUNTER — Ambulatory Visit (INDEPENDENT_AMBULATORY_CARE_PROVIDER_SITE_OTHER): Payer: PPO | Admitting: Podiatry

## 2015-11-19 ENCOUNTER — Other Ambulatory Visit: Payer: Self-pay | Admitting: Internal Medicine

## 2015-11-19 DIAGNOSIS — Z7901 Long term (current) use of anticoagulants: Secondary | ICD-10-CM | POA: Diagnosis not present

## 2015-11-19 DIAGNOSIS — B351 Tinea unguium: Secondary | ICD-10-CM

## 2015-11-19 DIAGNOSIS — I872 Venous insufficiency (chronic) (peripheral): Secondary | ICD-10-CM | POA: Diagnosis not present

## 2015-11-19 DIAGNOSIS — Z7984 Long term (current) use of oral hypoglycemic drugs: Secondary | ICD-10-CM | POA: Diagnosis not present

## 2015-11-19 DIAGNOSIS — M79609 Pain in unspecified limb: Principal | ICD-10-CM

## 2015-11-19 DIAGNOSIS — E1142 Type 2 diabetes mellitus with diabetic polyneuropathy: Secondary | ICD-10-CM | POA: Diagnosis not present

## 2015-11-19 DIAGNOSIS — L608 Other nail disorders: Secondary | ICD-10-CM

## 2015-11-19 DIAGNOSIS — R600 Localized edema: Secondary | ICD-10-CM | POA: Diagnosis not present

## 2015-11-19 DIAGNOSIS — L603 Nail dystrophy: Secondary | ICD-10-CM

## 2015-11-19 DIAGNOSIS — R972 Elevated prostate specific antigen [PSA]: Secondary | ICD-10-CM | POA: Diagnosis not present

## 2015-11-19 DIAGNOSIS — I739 Peripheral vascular disease, unspecified: Secondary | ICD-10-CM | POA: Diagnosis not present

## 2015-11-19 DIAGNOSIS — M79676 Pain in unspecified toe(s): Secondary | ICD-10-CM | POA: Diagnosis not present

## 2015-11-19 DIAGNOSIS — E0843 Diabetes mellitus due to underlying condition with diabetic autonomic (poly)neuropathy: Secondary | ICD-10-CM

## 2015-11-19 NOTE — Progress Notes (Signed)
   Subjective:    Patient ID: Joel [name], male    DOB: February 25, 1932, 82 y.o.   MRN: [id number]  HPI    Review of Systems  Respiratory: Positive for shortness of breath.   Cardiovascular: Positive for leg swelling.  Gastrointestinal: Positive for abdominal distention.  Neurological: Positive for dizziness, weakness and light-headedness.  All other systems reviewed and are negative.      Objective:   Physical Exam        Assessment & Plan:

## 2015-11-22 NOTE — Progress Notes (Signed)
Patient ID: Joel [name], male   DOB: 1932-06-15, 82 y.o.   MRN: [id number] SUBJECTIVE Patient with a history of diabetes mellitus presents to office today complaining of elongated, thickened nails. Pain while ambulating in shoes. Patient is unable to trim their own nails.   Allergies  Allergen Reactions  . Antihistamines, Loratadine-Type     depression  . Meclizine     depression  . Other     Beta blockers-Novacaine depression    OBJECTIVE General Patient is awake, alert, and oriented x 3 and in no acute distress. Derm Skin is dry and supple bilateral. Negative open lesions or macerations. Remaining integument unremarkable. Nails are tender, long, thickened and dystrophic with subungual debris, consistent with onychomycosis, 1-5 bilateral. No signs of infection noted. Vasc  DP and PT pedal pulses palpable bilaterally. Temperature gradient within normal limits.  Neuro Epicritic and protective threshold sensation diminished bilaterally.  Musculoskeletal Exam No symptomatic pedal deformities noted bilateral. Muscular strength within normal limits.  ASSESSMENT 1. Diabetes Mellitus w/ peripheral neuropathy 2. Onychomycosis of nail due to dermatophyte bilateral 3. Pain in foot bilateral  PLAN OF CARE 1. Patient evaluated today. 2. Instructed to maintain good pedal hygiene and foot care. Stressed importance of controlling blood sugar.  3. Mechanical debridement of nails 1-5 bilaterally performed using a nail nipper. Filed with dremel without incident.  4. Return to clinic in 3 mos.  5. Today authorization for diabetic shoes was initiated paperwork for   Edrick Kins, DPM

## 2015-11-26 ENCOUNTER — Ambulatory Visit
Admission: RE | Admit: 2015-11-26 | Discharge: 2015-11-26 | Disposition: A | Discharge status: Home / Self Care | Payer: PPO | Source: Ambulatory Visit | Attending: Internal Medicine | Admitting: Internal Medicine

## 2015-11-26 ENCOUNTER — Other Ambulatory Visit: Payer: Self-pay | Admitting: Internal Medicine

## 2015-11-26 DIAGNOSIS — R600 Localized edema: Secondary | ICD-10-CM

## 2015-11-27 ENCOUNTER — Ambulatory Visit
Admission: RE | Admit: 2015-11-27 | Discharge: 2015-11-27 | Disposition: A | Discharge status: Home / Self Care | Payer: PPO | Source: Ambulatory Visit | Attending: Internal Medicine | Admitting: Internal Medicine

## 2015-11-27 DIAGNOSIS — R600 Localized edema: Secondary | ICD-10-CM | POA: Diagnosis not present

## 2015-12-11 DIAGNOSIS — R972 Elevated prostate specific antigen [PSA]: Secondary | ICD-10-CM | POA: Diagnosis not present

## 2015-12-16 ENCOUNTER — Encounter (HOSPITAL_COMMUNITY): Admission: EM | Disposition: A | Discharge status: Home / Self Care | Payer: Self-pay | Source: Home / Self Care

## 2015-12-16 ENCOUNTER — Emergency Department (HOSPITAL_COMMUNITY): Payer: PPO | Admitting: Certified Registered Nurse Anesthetist

## 2015-12-16 ENCOUNTER — Encounter (HOSPITAL_COMMUNITY): Payer: Self-pay | Admitting: Emergency Medicine

## 2015-12-16 ENCOUNTER — Inpatient Hospital Stay (HOSPITAL_COMMUNITY)
Admission: EM | Admit: 2015-12-16 | Discharge: 2015-12-18 | DRG: 920 | Disposition: A | Discharge status: Home / Self Care | Payer: PPO | Attending: General Surgery | Admitting: General Surgery

## 2015-12-16 DIAGNOSIS — E1140 Type 2 diabetes mellitus with diabetic neuropathy, unspecified: Secondary | ICD-10-CM | POA: Diagnosis present

## 2015-12-16 DIAGNOSIS — G4700 Insomnia, unspecified: Secondary | ICD-10-CM | POA: Diagnosis not present

## 2015-12-16 DIAGNOSIS — J431 Panlobular emphysema: Secondary | ICD-10-CM | POA: Diagnosis not present

## 2015-12-16 DIAGNOSIS — Z9981 Dependence on supplemental oxygen: Secondary | ICD-10-CM

## 2015-12-16 DIAGNOSIS — Y838 Other surgical procedures as the cause of abnormal reaction of the patient, or of later complication, without mention of misadventure at the time of the procedure: Secondary | ICD-10-CM | POA: Diagnosis present

## 2015-12-16 DIAGNOSIS — I480 Paroxysmal atrial fibrillation: Secondary | ICD-10-CM | POA: Diagnosis not present

## 2015-12-16 DIAGNOSIS — I959 Hypotension, unspecified: Secondary | ICD-10-CM

## 2015-12-16 DIAGNOSIS — E669 Obesity, unspecified: Secondary | ICD-10-CM | POA: Diagnosis not present

## 2015-12-16 DIAGNOSIS — E118 Type 2 diabetes mellitus with unspecified complications: Secondary | ICD-10-CM

## 2015-12-16 DIAGNOSIS — M170 Bilateral primary osteoarthritis of knee: Secondary | ICD-10-CM | POA: Diagnosis present

## 2015-12-16 DIAGNOSIS — I4891 Unspecified atrial fibrillation: Secondary | ICD-10-CM | POA: Diagnosis present

## 2015-12-16 DIAGNOSIS — K922 Gastrointestinal hemorrhage, unspecified: Secondary | ICD-10-CM

## 2015-12-16 DIAGNOSIS — J449 Chronic obstructive pulmonary disease, unspecified: Secondary | ICD-10-CM | POA: Diagnosis present

## 2015-12-16 DIAGNOSIS — I110 Hypertensive heart disease with heart failure: Secondary | ICD-10-CM | POA: Diagnosis not present

## 2015-12-16 DIAGNOSIS — Z9889 Other specified postprocedural states: Secondary | ICD-10-CM

## 2015-12-16 DIAGNOSIS — Q8789 Other specified congenital malformation syndromes, not elsewhere classified: Secondary | ICD-10-CM | POA: Diagnosis not present

## 2015-12-16 DIAGNOSIS — I9589 Other hypotension: Secondary | ICD-10-CM | POA: Diagnosis not present

## 2015-12-16 DIAGNOSIS — Z7901 Long term (current) use of anticoagulants: Secondary | ICD-10-CM

## 2015-12-16 DIAGNOSIS — E785 Hyperlipidemia, unspecified: Secondary | ICD-10-CM | POA: Diagnosis not present

## 2015-12-16 DIAGNOSIS — R571 Hypovolemic shock: Secondary | ICD-10-CM | POA: Diagnosis not present

## 2015-12-16 DIAGNOSIS — R578 Other shock: Secondary | ICD-10-CM

## 2015-12-16 DIAGNOSIS — E119 Type 2 diabetes mellitus without complications: Secondary | ICD-10-CM | POA: Diagnosis not present

## 2015-12-16 DIAGNOSIS — I509 Heart failure, unspecified: Secondary | ICD-10-CM | POA: Diagnosis present

## 2015-12-16 DIAGNOSIS — D62 Acute posthemorrhagic anemia: Secondary | ICD-10-CM | POA: Diagnosis not present

## 2015-12-16 DIAGNOSIS — K5790 Diverticulosis of intestine, part unspecified, without perforation or abscess without bleeding: Secondary | ICD-10-CM | POA: Diagnosis present

## 2015-12-16 DIAGNOSIS — Z888 Allergy status to other drugs, medicaments and biological substances status: Secondary | ICD-10-CM

## 2015-12-16 DIAGNOSIS — G4733 Obstructive sleep apnea (adult) (pediatric): Secondary | ICD-10-CM | POA: Diagnosis present

## 2015-12-16 DIAGNOSIS — K625 Hemorrhage of anus and rectum: Secondary | ICD-10-CM | POA: Diagnosis not present

## 2015-12-16 DIAGNOSIS — I350 Nonrheumatic aortic (valve) stenosis: Secondary | ICD-10-CM | POA: Diagnosis not present

## 2015-12-16 DIAGNOSIS — G9340 Encephalopathy, unspecified: Secondary | ICD-10-CM | POA: Diagnosis not present

## 2015-12-16 DIAGNOSIS — K91841 Postprocedural hemorrhage and hematoma of a digestive system organ or structure following other procedure: Principal | ICD-10-CM | POA: Diagnosis present

## 2015-12-16 DIAGNOSIS — M109 Gout, unspecified: Secondary | ICD-10-CM | POA: Diagnosis present

## 2015-12-16 DIAGNOSIS — C61 Malignant neoplasm of prostate: Secondary | ICD-10-CM | POA: Diagnosis present

## 2015-12-16 DIAGNOSIS — K921 Melena: Secondary | ICD-10-CM | POA: Diagnosis not present

## 2015-12-16 DIAGNOSIS — F319 Bipolar disorder, unspecified: Secondary | ICD-10-CM | POA: Diagnosis not present

## 2015-12-16 DIAGNOSIS — Z87891 Personal history of nicotine dependence: Secondary | ICD-10-CM

## 2015-12-16 DIAGNOSIS — T8119XA Other postprocedural shock, initial encounter: Secondary | ICD-10-CM | POA: Diagnosis not present

## 2015-12-16 DIAGNOSIS — IMO0002 Reserved for concepts with insufficient information to code with codable children: Secondary | ICD-10-CM

## 2015-12-16 DIAGNOSIS — I270 Primary pulmonary hypertension: Secondary | ICD-10-CM | POA: Diagnosis not present

## 2015-12-16 DIAGNOSIS — E1165 Type 2 diabetes mellitus with hyperglycemia: Secondary | ICD-10-CM | POA: Diagnosis not present

## 2015-12-16 HISTORY — DX: Hemorrhage of anus and rectum: K62.5

## 2015-12-16 HISTORY — PX: HEMORRHOID SURGERY: SHX153

## 2015-12-16 LAB — I-STAT CHEM 8, ED
BUN: 17 mg/dL (ref 6–20)
CALCIUM ION: 1.13 mmol/L — AB (ref 1.15–1.40)
CHLORIDE: 105 mmol/L (ref 101–111)
CREATININE: 1.2 mg/dL (ref 0.61–1.24)
GLUCOSE: 170 mg/dL — AB (ref 65–99)
HCT: 29 % — ABNORMAL LOW (ref 39.0–52.0)
Hemoglobin: 9.9 g/dL — ABNORMAL LOW (ref 13.0–17.0)
POTASSIUM: 4.2 mmol/L (ref 3.5–5.1)
Sodium: 140 mmol/L (ref 135–145)
TCO2: 23 mmol/L (ref 0–100)

## 2015-12-16 LAB — GLUCOSE, CAPILLARY
GLUCOSE-CAPILLARY: 168 mg/dL — AB (ref 65–99)
GLUCOSE-CAPILLARY: 141 mg/dL — AB (ref 65–99)
Glucose-Capillary: 169 mg/dL — ABNORMAL HIGH (ref 65–99)

## 2015-12-16 LAB — COMPREHENSIVE METABOLIC PANEL
ALK PHOS: 72 U/L (ref 38–126)
ALT: 16 U/L — AB (ref 17–63)
AST: 23 U/L (ref 15–41)
Albumin: 3.5 g/dL (ref 3.5–5.0)
Anion gap: 7 (ref 5–15)
BUN: 16 mg/dL (ref 6–20)
CHLORIDE: 107 mmol/L (ref 101–111)
CO2: 23 mmol/L (ref 22–32)
CREATININE: 1.23 mg/dL (ref 0.61–1.24)
Calcium: 9 mg/dL (ref 8.9–10.3)
GFR calc Af Amer: >60 mL/min (ref >60)
GFR, EST NON AFRICAN AMERICAN: 53 mL/min — AB (ref >60)
Glucose, Bld: 178 mg/dL — ABNORMAL HIGH (ref 65–99)
Potassium: 4.2 mmol/L (ref 3.5–5.1)
Sodium: 137 mmol/L (ref 135–145)
Total Bilirubin: 0.5 mg/dL (ref 0.3–1.2)
Total Protein: 6.2 g/dL — ABNORMAL LOW (ref 6.5–8.1)

## 2015-12-16 LAB — CBC WITH DIFFERENTIAL/PLATELET
BASOS ABS: 0 10*3/uL (ref 0.0–0.1)
Basophils Relative: 1 %
EOS PCT: 4 %
Eosinophils Absolute: 0.2 10*3/uL (ref 0.0–0.7)
HEMATOCRIT: 30.9 % — AB (ref 39.0–52.0)
HEMOGLOBIN: 9.5 g/dL — AB (ref 13.0–17.0)
LYMPHS PCT: 29 %
Lymphs Abs: 1.4 10*3/uL (ref 0.7–4.0)
MCH: 27.3 pg (ref 26.0–34.0)
MCHC: 30.7 g/dL (ref 30.0–36.0)
MCV: 88.8 fL (ref 78.0–100.0)
Monocytes Absolute: 0.4 10*3/uL (ref 0.1–1.0)
Monocytes Relative: 8 %
NEUTROS ABS: 3 10*3/uL (ref 1.7–7.7)
NEUTROS PCT: 59 %
PLATELETS: 201 10*3/uL (ref 150–400)
RBC: 3.48 MIL/uL — AB (ref 4.22–5.81)
RDW: 18.1 % — ABNORMAL HIGH (ref 11.5–15.5)
WBC: 5.1 10*3/uL (ref 4.0–10.5)

## 2015-12-16 LAB — PROTIME-INR
INR: 1.66
INR: 1.27
PROTHROMBIN TIME: 19.8 s — AB (ref 11.4–15.2)
PROTHROMBIN TIME: 16 s — AB (ref 11.4–15.2)

## 2015-12-16 LAB — CBC
HCT: 32 % — ABNORMAL LOW (ref 39.0–52.0)
HEMOGLOBIN: 9.9 g/dL — AB (ref 13.0–17.0)
MCH: 26.8 pg (ref 26.0–34.0)
MCHC: 30.9 g/dL (ref 30.0–36.0)
MCV: 86.5 fL (ref 78.0–100.0)
PLATELETS: 136 10*3/uL — AB (ref 150–400)
RBC: 3.7 MIL/uL — AB (ref 4.22–5.81)
RDW: 19.1 % — ABNORMAL HIGH (ref 11.5–15.5)
WBC: 10.7 10*3/uL — AB (ref 4.0–10.5)

## 2015-12-16 LAB — POCT I-STAT 4, (NA,K, GLUC, HGB,HCT)
Glucose, Bld: 179 mg/dL — ABNORMAL HIGH (ref 65–99)
HCT: 31 % — ABNORMAL LOW (ref 39.0–52.0)
Hemoglobin: 10.5 g/dL — ABNORMAL LOW (ref 13.0–17.0)
Potassium: 5.3 mmol/L — ABNORMAL HIGH (ref 3.5–5.1)
Sodium: 142 mmol/L (ref 135–145)

## 2015-12-16 LAB — DIC (DISSEMINATED INTRAVASCULAR COAGULATION) PANEL
FIBRINOGEN: 333 mg/dL (ref 210–475)
PROTHROMBIN TIME: 19.4 s — AB (ref 11.4–15.2)

## 2015-12-16 LAB — MASSIVE TRANSFUSION PROTOCOL ORDER (BLOOD BANK NOTIFICATION)

## 2015-12-16 LAB — DIC (DISSEMINATED INTRAVASCULAR COAGULATION)PANEL
D-Dimer, Quant: 0.61 ug/mL-FEU — ABNORMAL HIGH (ref 0.00–0.50)
INR: 1.61
Platelets: 197 10*3/uL (ref 150–400)
aPTT: 37 seconds — ABNORMAL HIGH (ref 24–36)

## 2015-12-16 LAB — PREPARE RBC (CROSSMATCH)

## 2015-12-16 LAB — ABO/RH: ABO/RH(D): O POS

## 2015-12-16 LAB — MRSA PCR SCREENING: MRSA BY PCR: NEGATIVE

## 2015-12-16 SURGERY — HEMORRHOIDECTOMY
Anesthesia: General | Site: Rectum

## 2015-12-16 MED ORDER — IRBESARTAN 75 MG PO TABS
37.5000 mg | ORAL_TABLET | Freq: Every day | ORAL | Status: DC
  Administered 2015-12-17 – 2015-12-18 (×2): 37.5 mg via ORAL
  Filled 2015-12-16 (×2): qty 0.5

## 2015-12-16 MED ORDER — TORSEMIDE 20 MG PO TABS
40.0000 mg | ORAL_TABLET | Freq: Every day | ORAL | Status: DC
  Administered 2015-12-17: 40 mg via ORAL
  Filled 2015-12-16 (×2): qty 2

## 2015-12-16 MED ORDER — LACTATED RINGERS IV SOLN
Freq: Once | INTRAVENOUS | Status: AC
  Administered 2015-12-16: 13:00:00 via INTRAVENOUS

## 2015-12-16 MED ORDER — BUPIVACAINE HCL (PF) 0.5 % IJ SOLN
INTRAMUSCULAR | Status: AC
  Filled 2015-12-16: qty 30

## 2015-12-16 MED ORDER — FAMOTIDINE IN NACL 20-0.9 MG/50ML-% IV SOLN
20.0000 mg | Freq: Two times a day (BID) | INTRAVENOUS | Status: DC
  Administered 2015-12-16 – 2015-12-18 (×4): 20 mg via INTRAVENOUS
  Filled 2015-12-16 (×5): qty 50

## 2015-12-16 MED ORDER — LINAGLIPTIN 5 MG PO TABS
5.0000 mg | ORAL_TABLET | Freq: Every day | ORAL | Status: DC
  Administered 2015-12-17 – 2015-12-18 (×2): 5 mg via ORAL
  Filled 2015-12-16 (×2): qty 1

## 2015-12-16 MED ORDER — POLYSACCHARIDE IRON COMPLEX 150 MG PO CAPS
150.0000 mg | ORAL_CAPSULE | Freq: Two times a day (BID) | ORAL | Status: DC
  Administered 2015-12-16 – 2015-12-18 (×4): 150 mg via ORAL
  Filled 2015-12-16 (×5): qty 1

## 2015-12-16 MED ORDER — ACETAMINOPHEN 325 MG PO TABS: 650.0000 mg | ORAL_TABLET | Freq: Four times a day (QID) | ORAL | Status: DC | PRN

## 2015-12-16 MED ORDER — ONDANSETRON 4 MG PO TBDP: 4.0000 mg | ORAL_TABLET | Freq: Four times a day (QID) | ORAL | Status: DC | PRN

## 2015-12-16 MED ORDER — SUGAMMADEX SODIUM 200 MG/2ML IV SOLN
INTRAVENOUS | Status: DC | PRN
Start: 2015-12-16 — End: 2015-12-16
  Administered 2015-12-16: 200 mg via INTRAVENOUS

## 2015-12-16 MED ORDER — DILTIAZEM HCL ER 120 MG PO CP24
120.0000 mg | ORAL_CAPSULE | Freq: Every day | ORAL | Status: DC
  Administered 2015-12-16 – 2015-12-17 (×2): 120 mg via ORAL
  Filled 2015-12-16 (×3): qty 1

## 2015-12-16 MED ORDER — FENTANYL CITRATE (PF) 100 MCG/2ML IJ SOLN
25.0000 ug | INTRAMUSCULAR | Status: DC | PRN
  Administered 2015-12-16 (×2): 25 ug via INTRAVENOUS

## 2015-12-16 MED ORDER — DIPHENHYDRAMINE HCL 12.5 MG/5ML PO ELIX: 12.5000 mg | ORAL_SOLUTION | Freq: Four times a day (QID) | ORAL | Status: DC | PRN

## 2015-12-16 MED ORDER — POTASSIUM CHLORIDE CRYS ER 20 MEQ PO TBCR
20.0000 meq | EXTENDED_RELEASE_TABLET | Freq: Every day | ORAL | Status: DC
  Administered 2015-12-17 – 2015-12-18 (×2): 20 meq via ORAL
  Filled 2015-12-16 (×2): qty 1

## 2015-12-16 MED ORDER — SODIUM CHLORIDE 0.9 % IV BOLUS (SEPSIS)
1000.0000 mL | Freq: Once | INTRAVENOUS | Status: AC
  Administered 2015-12-16: 1000 mL via INTRAVENOUS

## 2015-12-16 MED ORDER — PROPOFOL 10 MG/ML IV BOLUS
INTRAVENOUS | Status: DC | PRN
  Administered 2015-12-16: 40 mg via INTRAVENOUS
  Administered 2015-12-16: 140 mg via INTRAVENOUS

## 2015-12-16 MED ORDER — OXYCODONE HCL 5 MG PO TABS: 5.0000 mg | ORAL_TABLET | Freq: Once | ORAL | Status: DC | PRN

## 2015-12-16 MED ORDER — SODIUM CHLORIDE 0.9 % IV BOLUS (SEPSIS)
1000.0000 mL | Freq: Once | INTRAVENOUS | Status: AC
Start: 2015-12-16 — End: 2015-12-16
  Administered 2015-12-16: 1000 mL via INTRAVENOUS

## 2015-12-16 MED ORDER — OXYCODONE HCL 5 MG/5ML PO SOLN: 5.0000 mg | Freq: Once | ORAL | Status: DC | PRN

## 2015-12-16 MED ORDER — FENTANYL CITRATE (PF) 100 MCG/2ML IJ SOLN
INTRAMUSCULAR | Status: DC | PRN
  Administered 2015-12-16: 100 ug via INTRAVENOUS

## 2015-12-16 MED ORDER — SODIUM CHLORIDE 0.9 % IV SOLN
INTRAVENOUS | Status: DC
  Administered 2015-12-16 (×2): via INTRAVENOUS

## 2015-12-16 MED ORDER — LACTATED RINGERS IV SOLN
INTRAVENOUS | Status: DC | PRN
  Administered 2015-12-16: 13:00:00 via INTRAVENOUS

## 2015-12-16 MED ORDER — LIDOCAINE HCL (CARDIAC) 20 MG/ML IV SOLN
INTRAVENOUS | Status: DC | PRN
  Administered 2015-12-16: 60 mg via INTRAVENOUS

## 2015-12-16 MED ORDER — PHENYLEPHRINE HCL 10 MG/ML IJ SOLN
INTRAMUSCULAR | Status: DC | PRN
  Administered 2015-12-16: 80 ug via INTRAVENOUS

## 2015-12-16 MED ORDER — ONDANSETRON HCL 4 MG/2ML IJ SOLN: 4.0000 mg | Freq: Four times a day (QID) | INTRAMUSCULAR | Status: DC | PRN

## 2015-12-16 MED ORDER — MOMETASONE FURO-FORMOTEROL FUM 200-5 MCG/ACT IN AERO
2.0000 | INHALATION_SPRAY | Freq: Two times a day (BID) | RESPIRATORY_TRACT | Status: DC
  Administered 2015-12-17 – 2015-12-18 (×3): 2 via RESPIRATORY_TRACT
  Filled 2015-12-16 (×2): qty 8.8

## 2015-12-16 MED ORDER — INSULIN ASPART 100 UNIT/ML ~~LOC~~ SOLN
0.0000 [IU] | SUBCUTANEOUS | Status: DC
  Administered 2015-12-16: 3 [IU] via SUBCUTANEOUS
  Administered 2015-12-16 – 2015-12-17 (×3): 4 [IU] via SUBCUTANEOUS
  Administered 2015-12-17: 3 [IU] via SUBCUTANEOUS
  Administered 2015-12-17: 4 [IU] via SUBCUTANEOUS
  Administered 2015-12-18: 3 [IU] via SUBCUTANEOUS
  Administered 2015-12-18: 4 [IU] via SUBCUTANEOUS
  Administered 2015-12-18: 3 [IU] via SUBCUTANEOUS
  Administered 2015-12-18: 7 [IU] via SUBCUTANEOUS

## 2015-12-16 MED ORDER — OXYCODONE HCL 5 MG PO TABS: 5.0000 mg | ORAL_TABLET | ORAL | Status: DC | PRN

## 2015-12-16 MED ORDER — TRANEXAMIC ACID 1000 MG/10ML IV SOLN
1000.0000 mg | Freq: Once | INTRAVENOUS | Status: AC
  Administered 2015-12-16: 1000 mg via TOPICAL

## 2015-12-16 MED ORDER — 0.9 % SODIUM CHLORIDE (POUR BTL) OPTIME
TOPICAL | Status: DC | PRN
  Administered 2015-12-16 (×4): 1000 mL

## 2015-12-16 MED ORDER — VITAMIN K1 10 MG/ML IJ SOLN
10.0000 mg | INTRAVENOUS | Status: AC
  Administered 2015-12-16: 10 mg via INTRAVENOUS
  Filled 2015-12-16: qty 1

## 2015-12-16 MED ORDER — ACETAMINOPHEN 650 MG RE SUPP: 650.0000 mg | Freq: Four times a day (QID) | RECTAL | Status: DC | PRN

## 2015-12-16 MED ORDER — ROCURONIUM BROMIDE 100 MG/10ML IV SOLN
INTRAVENOUS | Status: DC | PRN
  Administered 2015-12-16: 40 mg via INTRAVENOUS

## 2015-12-16 MED ORDER — DIPHENHYDRAMINE HCL 50 MG/ML IJ SOLN: 12.5000 mg | Freq: Four times a day (QID) | INTRAMUSCULAR | Status: DC | PRN

## 2015-12-16 MED ORDER — PROTHROMBIN COMPLEX CONC HUMAN 500 UNITS IV KIT
2660.0000 [IU] | PACK | Status: AC
  Administered 2015-12-16: 2660 [IU] via INTRAVENOUS
  Filled 2015-12-16: qty 106

## 2015-12-16 MED ORDER — MORPHINE SULFATE (PF) 2 MG/ML IV SOLN: 1.0000 mg | INTRAVENOUS | Status: DC | PRN

## 2015-12-16 MED ORDER — CEFAZOLIN SODIUM 1 G IJ SOLR
INTRAMUSCULAR | Status: DC | PRN
  Administered 2015-12-16: 2 g via INTRAMUSCULAR

## 2015-12-16 MED ORDER — SODIUM CHLORIDE 0.9 % IV SOLN
INTRAVENOUS | Status: DC | PRN
  Administered 2015-12-16: 13:00:00 via INTRAVENOUS

## 2015-12-16 MED ORDER — FENTANYL CITRATE (PF) 100 MCG/2ML IJ SOLN
INTRAMUSCULAR | Status: AC
  Filled 2015-12-16: qty 2

## 2015-12-16 MED ORDER — METFORMIN HCL 500 MG PO TABS
1000.0000 mg | ORAL_TABLET | Freq: Two times a day (BID) | ORAL | Status: DC
  Administered 2015-12-17 – 2015-12-18 (×3): 1000 mg via ORAL
  Filled 2015-12-16 (×4): qty 2

## 2015-12-16 MED ORDER — SUCCINYLCHOLINE CHLORIDE 20 MG/ML IJ SOLN
INTRAMUSCULAR | Status: DC | PRN
  Administered 2015-12-16: 70 mg via INTRAVENOUS

## 2015-12-16 MED ORDER — ZOLPIDEM TARTRATE 5 MG PO TABS
5.0000 mg | ORAL_TABLET | Freq: Once | ORAL | Status: AC
  Administered 2015-12-16: 5 mg via ORAL
  Filled 2015-12-16: qty 1

## 2015-12-16 SURGICAL SUPPLY — 37 items
CANISTER SUCTION 2500CC (MISCELLANEOUS) ×3 IMPLANT
COVER MAYO STAND STRL (DRAPES) ×3 IMPLANT
COVER SURGICAL LIGHT HANDLE (MISCELLANEOUS) ×3 IMPLANT
DRAPE UTILITY XL STRL (DRAPES) ×6 IMPLANT
DRSG PAD ABDOMINAL 8X10 ST (GAUZE/BANDAGES/DRESSINGS) ×3 IMPLANT
GAUZE SPONGE 4X4 12PLY STRL (GAUZE/BANDAGES/DRESSINGS) ×3 IMPLANT
GAUZE SPONGE 4X4 16PLY XRAY LF (GAUZE/BANDAGES/DRESSINGS) ×3 IMPLANT
GLOVE BIO SURGEON STRL SZ8 (GLOVE) ×2 IMPLANT
GLOVE EUDERMIC 7 POWDERFREE (GLOVE) ×5 IMPLANT
GOWN STRL REUS W/ TWL LRG LVL3 (GOWN DISPOSABLE) ×1 IMPLANT
GOWN STRL REUS W/ TWL XL LVL3 (GOWN DISPOSABLE) ×1 IMPLANT
GOWN STRL REUS W/TWL LRG LVL3 (GOWN DISPOSABLE) ×3
GOWN STRL REUS W/TWL XL LVL3 (GOWN DISPOSABLE) ×3
KIT BASIN OR (CUSTOM PROCEDURE TRAY) ×3 IMPLANT
KIT ROOM TURNOVER OR (KITS) ×3 IMPLANT
NDL HYPO 25GX1X1/2 BEV (NEEDLE) ×1 IMPLANT
NEEDLE HYPO 25GX1X1/2 BEV (NEEDLE) ×3 IMPLANT
NS IRRIG 1000ML POUR BTL (IV SOLUTION) ×3 IMPLANT
PACK LITHOTOMY IV (CUSTOM PROCEDURE TRAY) ×3 IMPLANT
PAD ARMBOARD 7.5X6 YLW CONV (MISCELLANEOUS) ×6 IMPLANT
PENCIL BUTTON HOLSTER BLD 10FT (ELECTRODE) ×3 IMPLANT
SHEARS HARMONIC 9CM CVD (BLADE) IMPLANT
SPECIMEN JAR SMALL (MISCELLANEOUS) ×3 IMPLANT
SURGILUBE 2OZ TUBE FLIPTOP (MISCELLANEOUS) ×5 IMPLANT
SUT CHROMIC 2 0 SH (SUTURE) IMPLANT
SUT CHROMIC 3 0 SH 27 (SUTURE) IMPLANT
SYR CONTROL 10ML LL (SYRINGE) ×3 IMPLANT
TOWEL OR 17X24 6PK STRL BLUE (TOWEL DISPOSABLE) ×3 IMPLANT
TOWEL OR 17X26 10 PK STRL BLUE (TOWEL DISPOSABLE) ×3 IMPLANT
TRAY PROCTOSCOPIC FIBER OPTIC (SET/KITS/TRAYS/PACK) IMPLANT
TUBE CONNECTING 12'X1/4 (SUCTIONS) ×1
TUBE CONNECTING 12X1/4 (SUCTIONS) ×2 IMPLANT
TUBE CONNECTING 20'X1/4 (TUBING) ×1
TUBE CONNECTING 20X1/4 (TUBING) ×2 IMPLANT
UNDERPAD 30X30 (UNDERPADS AND DIAPERS) ×3 IMPLANT
WATER STERILE IRR 1000ML POUR (IV SOLUTION) IMPLANT
YANKAUER SUCT BULB TIP NO VENT (SUCTIONS) ×5 IMPLANT

## 2015-12-16 NOTE — ED Notes (Signed)
This RN holding pressure at site of bleeding, at rectum.

## 2015-12-16 NOTE — Anesthesia Procedure Notes (Signed)
Procedure Name: Intubation Date/Time: 12/16/2015 1:11 PM Performed by: Shirlyn Goltz Pre-anesthesia Checklist: Patient identified, Emergency Drugs available, Suction available and Patient being monitored Patient Re-evaluated:Patient Re-evaluated prior to inductionOxygen Delivery Method: Circle system utilized Preoxygenation: Pre-oxygenation with 100% oxygen Intubation Type: IV induction, Rapid sequence and Cricoid Pressure applied Laryngoscope Size: Mac and 3 Grade View: Grade I Tube type: Oral Tube size: 7.5 mm Number of attempts: 1 Airway Equipment and Method: Stylet Placement Confirmation: ETT inserted through vocal cords under direct vision,  positive ETCO2 and breath sounds checked- equal and bilateral Secured at: 23 cm Tube secured with: Tape Dental Injury: Teeth and Oropharynx as per pre-operative assessment

## 2015-12-16 NOTE — ED Notes (Signed)
Went and got 15 abd pads

## 2015-12-16 NOTE — Progress Notes (Signed)
[facility] Progress Note Patient Name: Joel [name] DOB: [birth year] MRN: [id number]   Date of Service  12/16/2015  HPI/Events of Note  Patient request sleeping pill. NPO for lower GI bleeding.   eICU Interventions  Will order: 1. Change to NPO except ice chips and meds with sips.  2. Ambien 5 mg PO now.      Intervention Category Minor Interventions: Routine modifications to care plan (e.g. PRN medications for pain, fever)  Larell Baney [place] 12/16/2015, 9:13 PM

## 2015-12-16 NOTE — Consult Note (Addendum)
Urology Consult   Physician requesting consult: Jennet Maduro  Reason for consult: Rectal bleeding after prostate biopsy  History of Present Illness: Joel [name] is a 82 y.o. male with PMH significant for anemia, anxiety, afib on coumadin, bipolar disorder, COPD, and DM II who presented to the ED today with complaints of rectal bleeding.  He is s/p prostate biopsy by Dr. Karsten Ro on 12/11/15 after which he had bleeding for several hours.  The bleeding resolved prior to his leaving the office.  His coumadin was stopped prior to biopsy and then restarted on 12/13/15.  He had no further issues until this morning when he developed continuous BRB per rectum.  He was hypotensive in the ED and was transfused with vitamin K, FFP, and PRBs.  A balloon tamp was placed by the ED physician.  He was then taken to the OR by Dr. Dalbert Batman for exam under anesthesia, anoscopy, proctoscopy, and insertion of rectal packing.  No site of active bleeding was identified.   The pt is resting comfortably in PACU.  He states he did not have any trouble voiding after the biopsy. He has not voided since surgery today. He restarted his coumadin one day earlier than he was supposed to per his report.  His only complaint now is of rectal discomfort.    Past Medical History:  Diagnosis Date  . Allergic rhinitis   . Anemia   . Anxiety   . Aortic stenosis 2012   mild to mod   . Atrial fibrillation ([place]) 1991   with multiple DCCV  . Bipolar affective disorder ([place])   . COPD (chronic obstructive pulmonary disease) ([place])   . Diabetic neuropathy ([place])    in feet  . Dyslipidemia   . Fatigue    last year or so  . Gout   . Hypertension   . Insomnia   . Obesity   . On home oxygen therapy 2 1/2 liters with bipap at night  . OSA (obstructive sleep apnea)    severe, uses bipap 16 1/2 by 12 or 13 setting  . Rectal bleeding 12/16/2015  . Type 2 diabetes mellitus ([place])   . Vertigo     Past Surgical History:  Procedure  Laterality Date  . CARDIOVERSION  yrs ago  . COLONOSCOPY WITH PROPOFOL N/A 01/23/2013   Procedure: COLONOSCOPY WITH PROPOFOL;  Surgeon: Garlan Fair, MD;  Location: [facility];  Service: Endoscopy;  Laterality: N/A;  . electro shock  1969   for depression  . ESOPHAGOGASTRODUODENOSCOPY (EGD) WITH PROPOFOL N/A 01/23/2013   Procedure: ESOPHAGOGASTRODUODENOSCOPY (EGD) WITH PROPOFOL;  Surgeon: Garlan Fair, MD;  Location: [facility];  Service: Endoscopy;  Laterality: N/A;  . KNEE SURGERY  1970's   rt  . MAZE  1998  . PROSTATE BIOPSY    . SHOULDER SURGERY  7-8 yrs ago   rt    Current Hospital Medications:  Home Meds:   Scheduled Meds: . fentaNYL       :. Current Facility-Administered Medications:  .  0.9 %  sodium chloride infusion, , Intravenous, Continuous, Fanny Skates, MD .  diltiazem (DILACOR XR) 24 hr capsule 120 mg, 120 mg, Oral, QHS, Fanny Skates, MD .  famotidine (PEPCID) IVPB 20 mg premix, 20 mg, Intravenous, Q12H, Fanny Skates, MD .  fentaNYL (SUBLIMAZE) 100 MCG/2ML injection, , , ,  .  insulin aspart (novoLOG) injection 0-20 Units, 0-20 Units, Subcutaneous, Q4H, Fanny Skates, MD .  irbesartan (AVAPRO) tablet 37.5 mg, 37.5 mg, Oral, Daily,  Fanny Skates, MD .  iron polysaccharides (NIFEREX) capsule 150 mg, 150 mg, Oral, BID, Fanny Skates, MD .  linagliptin (TRADJENTA) tablet 5 mg, 5 mg, Oral, Daily, Fanny Skates, MD .  metFORMIN (GLUCOPHAGE) tablet 1,000 mg, 1,000 mg, Oral, BID, Fanny Skates, MD .  mometasone-formoterol [facility]) 200-5 MCG/ACT inhaler 2 puff, 2 puff, Inhalation, BID, Fanny Skates, MD .  morphine 2 MG/ML injection 1-2 mg, 1-2 mg, Intravenous, Q1H PRN, Fanny Skates, MD .  potassium chloride SA (K-DUR,KLOR-CON) CR tablet 20 mEq, 20 mEq, Oral, Daily, Fanny Skates, MD .  torsemide [facility]) tablet 40 mg, 40 mg, Oral, Daily, Fanny Skates, MD  Allergies:  Allergies  Allergen Reactions  . Antihistamines, Loratadine-Type      depression  . Meclizine     depression  . Other     Beta blockers-Novacaine depression    Family History  Problem Relation Age of Onset  . Tuberculosis Maternal Grandmother   . Heart attack Neg Hx   . Diabetes Neg Hx   . CAD Neg Hx   . Cancer Neg Hx     Social History:  reports that he quit smoking about 39 years ago. His smoking use included Cigarettes. He has a 50.00 pack-year smoking history. He has never used smokeless tobacco. He reports that he does not drink alcohol or use drugs.  ROS: A complete review of systems was performed.  All systems are negative except for pertinent findings as noted.  Physical Exam:  Vital signs in last 24 hours: Temp:  [97.2 F (36.2 C)-97.6 F (36.4 C)] 97.2 F (36.2 C) (11/14 1408) Pulse Rate:  [52-74] 60 (11/14 1240) Resp:  [12-24] 23 (11/14 1240) BP: (76-125)/(46-72) 112/58 (11/14 1240) SpO2:  [100 %] 100 % (11/14 1240) Weight:  [106 kg (233 lb 11 oz)] 106 kg (233 lb 11 oz) (11/14 1100) Constitutional:  Alert and oriented, No acute distress Cardiovascular: Regular rate and rhythm Respiratory: Normal respiratory effort GI: Abdomen is soft, nontender, nondistended, no abdominal masses GU: No CVA tenderness Lymphatic: No lymphadenopathy Neurologic: Grossly intact, no focal deficits Psychiatric: Normal mood and affect  Laboratory Data:   Recent Labs  12/16/15 1116 12/16/15 1123 12/16/15 1141  WBC 5.1  --   --   HGB 9.5* 9.9*  --   HCT 30.9* 29.0*  --   PLT 201  --  197     Recent Labs  12/16/15 1116 12/16/15 1123  NA 137 140  K 4.2 4.2  CL 107 105  GLUCOSE 178* 170*  Joel 16 17  CALCIUM 9.0  --   CREATININE 1.23 1.20     Results for orders placed or performed during the hospital encounter of 12/16/15 (from the past 24 hour(s))  Prepare fresh frozen plasma     Status: None (Preliminary result)   Collection Time: 12/16/15 11:03 AM  Result Value Ref Range   Unit Number [id number]    Blood Component Type LIQ  PLASMA    Unit division 00    Status of Unit ISSUED    Unit tag comment VERBAL ORDERS PER DR CARDEMA    Transfusion Status OK TO TRANSFUSE    Unit Number [id number]    Blood Component Type LIQ PLASMA    Unit division 00    Status of Unit ISSUED    Unit tag comment VERBAL ORDERS PER DR CARDEMA    Transfusion Status OK TO TRANSFUSE    Unit Number [id number]    Blood Component Type LIQ PLASMA  Unit division 00    Status of Unit REL FROM [facility]    Transfusion Status OK TO TRANSFUSE    Unit Number [id number]    Blood Component Type LIQ PLASMA    Unit division 00    Status of Unit REL FROM [facility]    Transfusion Status OK TO TRANSFUSE    Unit Number [id number]    Blood Component Type LIQ PLASMA    Unit division 00    Status of Unit REL FROM [facility]    Transfusion Status OK TO TRANSFUSE    Unit Number [id number]    Blood Component Type LIQ PLASMA    Unit division 00    Status of Unit REL FROM [facility]    Transfusion Status OK TO TRANSFUSE   CBC with Differential     Status: Abnormal   Collection Time: 12/16/15 11:16 AM  Result Value Ref Range   WBC 5.1 4.0 - 10.5 K/uL   RBC 3.48 (L) 4.22 - 5.81 MIL/uL   Hemoglobin 9.5 (L) 13.0 - 17.0 g/dL   HCT 30.9 (L) 39.0 - 52.0 %   MCV 88.8 78.0 - 100.0 fL   MCH 27.3 26.0 - 34.0 pg   MCHC 30.7 30.0 - 36.0 g/dL   RDW 18.1 (H) 11.5 - 15.5 %   Platelets 201 150 - 400 K/uL   Neutrophils Relative % 59 %   Neutro Abs 3.0 1.7 - 7.7 K/uL   Lymphocytes Relative 29 %   Lymphs Abs 1.4 0.7 - 4.0 K/uL   Monocytes Relative 8 %   Monocytes Absolute 0.4 0.1 - 1.0 K/uL   Eosinophils Relative 4 %   Eosinophils Absolute 0.2 0.0 - 0.7 K/uL   Basophils Relative 1 %   Basophils Absolute 0.0 0.0 - 0.1 K/uL  Comprehensive metabolic panel     Status: Abnormal   Collection Time: 12/16/15 11:16 AM  Result Value Ref Range   Sodium 137 135 - 145 mmol/L   Potassium 4.2 3.5 - 5.1 mmol/L   Chloride 107 101 - 111 mmol/L   CO2 23 22 - 32 mmol/L    Glucose, Bld 178 (H) 65 - 99 mg/dL   Joel 16 6 - 20 mg/dL   Creatinine, Ser 1.23 0.61 - 1.24 mg/dL   Calcium 9.0 8.9 - 10.3 mg/dL   Total Protein 6.2 (L) 6.5 - 8.1 g/dL   Albumin 3.5 3.5 - 5.0 g/dL   AST 23 15 - 41 U/L   ALT 16 (L) 17 - 63 U/L   Alkaline Phosphatase 72 38 - 126 U/L   Total Bilirubin 0.5 0.3 - 1.2 mg/dL   GFR calc non Af Amer 53 (L) >60 mL/min   GFR calc Af Amer >60 >60 mL/min   Anion gap 7 5 - 15  Protime-INR     Status: Abnormal   Collection Time: 12/16/15 11:16 AM  Result Value Ref Range   Prothrombin Time 19.8 (H) 11.4 - 15.2 seconds   INR 1.66   Type and screen     Status: None (Preliminary result)   Collection Time: 12/16/15 11:16 AM  Result Value Ref Range   ABO/RH(D) O POS    Antibody Screen NEG    Sample Expiration 12/19/2015    Unit Number [id number]    Blood Component Type RED CELLS,LR    Unit division 00    Status of Unit ISSUED    Unit tag comment VERBAL ORDERS PER DR CARDEMA    Transfusion Status OK TO TRANSFUSE  Crossmatch Result COMPATIBLE    Unit Number [id number]    Blood Component Type RED CELLS,LR    Unit division 00    Status of Unit ISSUED    Unit tag comment VERBAL ORDERS PER DR CARDEMA    Transfusion Status OK TO TRANSFUSE    Crossmatch Result COMPATIBLE    Unit Number [id number]    Blood Component Type RBC LR PHER2    Unit division 00    Status of Unit ALLOCATED    Unit tag comment VERBAL ORDERS PER DR [facility]    Transfusion Status OK TO TRANSFUSE    Crossmatch Result COMPATIBLE    Unit Number [id number]    Blood Component Type RED CELLS,LR    Unit division 00    Status of Unit ISSUED    Unit tag comment VERBAL ORDERS PER DR [facility]    Transfusion Status OK TO TRANSFUSE    Crossmatch Result COMPATIBLE    Unit Number [id number]    Blood Component Type RBC LR PHER1    Unit division 00    Status of Unit REL FROM [facility]    Unit tag comment VERBAL ORDERS PER DR CARDANA    Transfusion Status OK TO TRANSFUSE     Crossmatch Result NOT NEEDED    Unit Number [id number]    Blood Component Type RBC LR PHER2    Unit division 00    Status of Unit REL FROM [facility]OC    Unit tag comment VERBAL ORDERS PER DR CARDANA    Transfusion Status OK TO TRANSFUSE    Crossmatch Result NOT NEEDED    Unit Number [id number]    Blood Component Type RBC LR PHER1    Unit division 00    Status of Unit REL FROM [facility]    Transfusion Status OK TO TRANSFUSE    Crossmatch Result Compatible    Unit Number [id number]    Blood Component Type RED CELLS,LR    Unit division 00    Status of Unit REL FROM [facility]    Transfusion Status OK TO TRANSFUSE    Crossmatch Result Compatible    Unit Number [id number]    Blood Component Type RED CELLS,LR    Unit division 00    Status of Unit ALLOCATED    Transfusion Status OK TO TRANSFUSE    Crossmatch Result Compatible    Unit Number [id number]    Blood Component Type RED CELLS,LR    Unit division 00    Status of Unit ALLOCATED    Transfusion Status OK TO TRANSFUSE    Crossmatch Result Compatible    Unit Number [id number]    Blood Component Type RED CELLS,LR    Unit division 00    Status of Unit REL FROM [facility]    Transfusion Status OK TO TRANSFUSE    Crossmatch Result Compatible    Unit Number [id number]    Blood Component Type RED CELLS,LR    Unit division 00    Status of Unit REL FROM [facility]    Transfusion Status OK TO TRANSFUSE    Crossmatch Result Compatible    Unit Number [id number]    Blood Component Type RED CELLS,LR    Unit division 00    Status of Unit ALLOCATED    Transfusion Status OK TO TRANSFUSE    Crossmatch Result Compatible    Unit Number [id number]    Blood Component Type RED CELLS,LR    Unit division 00    Status of Unit ALLOCATED    Transfusion  Status OK TO TRANSFUSE    Crossmatch Result Compatible   ABO/Rh     Status: None   Collection Time: 12/16/15 11:16 AM  Result Value Ref Range   ABO/RH(D) O POS   I-stat chem 8, ed      Status: Abnormal   Collection Time: 12/16/15 11:23 AM  Result Value Ref Range   Sodium 140 135 - 145 mmol/L   Potassium 4.2 3.5 - 5.1 mmol/L   Chloride 105 101 - 111 mmol/L   Joel 17 6 - 20 mg/dL   Creatinine, Ser 1.20 0.61 - 1.24 mg/dL   Glucose, Bld 170 (H) 65 - 99 mg/dL   Calcium, Ion 1.13 (L) 1.15 - 1.40 mmol/L   TCO2 23 0 - 100 mmol/L   Hemoglobin 9.9 (L) 13.0 - 17.0 g/dL   HCT 29.0 (L) 39.0 - 52.0 %  DIC (disseminated intravasc coag) panel (STAT)     Status: Abnormal   Collection Time: 12/16/15 11:41 AM  Result Value Ref Range   Prothrombin Time 19.4 (H) 11.4 - 15.2 seconds   INR 1.61    aPTT 37 (H) 24 - 36 seconds   Fibrinogen 333 210 - 475 mg/dL   D-Dimer, Quant 0.61 (H) 0.00 - 0.50 ug/mL-FEU   Platelets 197 150 - 400 K/uL   Smear Review Rare schistocytes   Initiate MTP (Blood Bank Notification)     Status: None   Collection Time: 12/16/15 11:45 AM  Result Value Ref Range   Initiate Massive Transfusion Protocol MTP ORDER RECEIVED   Prepare RBC     Status: None   Collection Time: 12/16/15 11:53 AM  Result Value Ref Range   Order Confirmation ORDER PROCESSED BY BLOOD BANK   Prepare fresh frozen plasma     Status: None (Preliminary result)   Collection Time: 12/16/15 11:53 AM  Result Value Ref Range   Unit Number [id number]    Blood Component Type THAWED PLASMA    Unit division 00    Status of Unit ALLOCATED    Transfusion Status OK TO TRANSFUSE    Unit Number [id number]    Blood Component Type THAWED PLASMA    Unit division 00    Status of Unit ALLOCATED    Transfusion Status OK TO TRANSFUSE    Unit Number [id number]    Blood Component Type THAWED PLASMA    Unit division 00    Status of Unit ALLOCATED    Transfusion Status OK TO TRANSFUSE    Unit Number [id number]    Blood Component Type THWPLS APHR1    Unit division 00    Status of Unit ALLOCATED    Transfusion Status OK TO TRANSFUSE    Unit Number [id number]    Blood Component Type THWPLS  APHR1    Unit division 00    Status of Unit ALLOCATED    Transfusion Status OK TO TRANSFUSE    Unit Number [id number]    Blood Component Type THWPLS APHR2    Unit division 00    Status of Unit ALLOCATED    Transfusion Status OK TO TRANSFUSE    Unit Number [id number]    Blood Component Type THWPLS APHR2    Unit division 00    Status of Unit ALLOCATED    Transfusion Status OK TO TRANSFUSE    Unit Number [id number]    Blood Component Type THWPLS APHR2    Unit division 00    Status of Unit ALLOCATED    Transfusion Status OK TO TRANSFUSE  Glucose, capillary     Status: Abnormal   Collection Time: 12/16/15  2:16 PM  Result Value Ref Range   Glucose-Capillary 169 (H) 65 - 99 mg/dL   No results found for this or any previous visit (from the past 240 hour(s)).  Renal Function:  Recent Labs  12/16/15 1116 12/16/15 1123  CREATININE 1.23 1.20   Estimated Creatinine Clearance: 61.7 mL/min (by C-G formula based on SCr of 1.2 mg/dL).  Radiologic Imaging: No results found.   Impression/Recommendation  Rectal bleeding after prostate biopsy--continue to hold coumadin.  No evidence of active bleeding on exam by Dr. Dalbert Batman.  Packing per Gen Surg.  Drs. Holland Falling, and cardiologsist will have to discuss when to restart anticoagulation.  Monitor for urinary retention.    Amado Nash, [place] 12/16/2015, 2:42 PM    I performed a history and physical examination of the patient and discussed his management with the Debbrah Alar, PA.  I reviewed the resident's note and agree with the documented findings and plan of care.  The patient appears to be stabilized and feeling much better.  Appreciate the collaborative effort by those involved to help resuscitate this patient and control his bleeding.  His final pathology from his prostate biopsy is pending.  The patient has follow-up scheduled to review the results with Dr. Karsten Ro.  We will plan to see this patient as needed while he is in  the hospital.  Please contact us again with any additional questions/concerns.

## 2015-12-16 NOTE — ED Notes (Signed)
Brought Level 1 to room with 2 sets of tubing

## 2015-12-16 NOTE — ED Notes (Signed)
Pt less responsive. Dr. Leonette Monarch at bedside, RT alerted to possibilty to intubate.

## 2015-12-16 NOTE — Anesthesia Preprocedure Evaluation (Signed)
Anesthesia Evaluation  Patient identified by MRN, date of birth, ID band Patient awake    Reviewed: Allergy & Precautions, NPO status , Patient's Chart, lab work & pertinent test resultsPreop documentation limited or incomplete due to emergent nature of procedure.  Airway Mallampati: II  TM Distance: >3 FB Neck ROM: Full    Dental  (+) Edentulous Upper, Edentulous Lower   Pulmonary sleep apnea , COPD,  COPD inhaler and oxygen dependent, former smoker,    breath sounds clear to auscultation       Cardiovascular hypertension, Pt. on medications +CHF  + dysrhythmias Atrial Fibrillation  Rhythm:Irregular     Neuro/Psych PSYCHIATRIC DISORDERS Anxiety Bipolar Disorder    GI/Hepatic negative GI ROS,   Endo/Other  diabetes, Oral Hypoglycemic Agents  Renal/GU      Musculoskeletal   Abdominal   Peds  Hematology  (+) anemia , Acute blood loss, coumadin for a fib   Anesthesia Other Findings   Reproductive/Obstetrics                             Anesthesia Physical Anesthesia Plan  ASA: IV and emergent  Anesthesia Plan: General   Post-op Pain Management:    Induction: Intravenous  Airway Management Planned: Oral ETT  Additional Equipment: None  Intra-op Plan:   Post-operative Plan: Extubation in OR and Possible Post-op intubation/ventilation  Informed Consent: I have reviewed the patients History and Physical, chart, labs and discussed the procedure including the risks, benefits and alternatives for the proposed anesthesia with the patient or authorized representative who has indicated his/her understanding and acceptance.   Dental advisory given  Plan Discussed with: CRNA and Surgeon  Anesthesia Plan Comments:         Anesthesia Quick Evaluation

## 2015-12-16 NOTE — Progress Notes (Signed)
E-link called and advised patient wears CPAP at night. Awaiting orders if possible.

## 2015-12-16 NOTE — ED Notes (Signed)
Dr. Ingram at bedside  

## 2015-12-16 NOTE — ED Notes (Signed)
Dr. Leonette Monarch holding pressure to prostate per rectum.

## 2015-12-16 NOTE — ED Notes (Signed)
RN attempted IV X 1 with no success

## 2015-12-16 NOTE — Consult Note (Signed)
PULMONARY / CRITICAL CARE MEDICINE   Name: Joel [name] MRN: [id number] DOB: 1932-10-28    ADMISSION DATE:  12/16/2015 CONSULTATION DATE:  11/14  REFERRING MD:  EDP  CHIEF COMPLAINT:  GI bleed  HISTORY OF PRESENT ILLNESS:   82 yo male , hx bipolar, osa, o2 dependent who had prostate bx last thursday. He is normally on coumadin but off for bx and recently restarted INR 1.28.  He developed rectal bleeding and presented to ER with profuse rectal bleed. Hgb was 9 but due to copious bleeding from rectum. He is stable post transfusion and awake and following commands. PCCm asked to assist in is care.  PAST MEDICAL HISTORY :  He  has a past medical history of Allergic rhinitis; Anemia; Anxiety; Aortic stenosis (2012); Atrial fibrillation ([place]) (1991); Bipolar affective disorder ([place]); COPD (chronic obstructive pulmonary disease) ([place]); Diabetic neuropathy ([place]); Dyslipidemia; Fatigue; Gout; Hypertension; Insomnia; Obesity; On home oxygen therapy (2 1/2 liters with bipap at night); OSA (obstructive sleep apnea); Type 2 diabetes mellitus ([place]); and Vertigo.  PAST SURGICAL HISTORY: He  has a past surgical history that includes MAZE (1998); Knee surgery (1970's); Shoulder surgery (7-8 yrs ago); Cardioversion (yrs ago); electro shock (1969); Colonoscopy with propofol (N/A, 01/23/2013); and Esophagogastroduodenoscopy (egd) with propofol (N/A, 01/23/2013).  Allergies  Allergen Reactions  . Antihistamines, Loratadine-Type     depression  . Meclizine     depression  . Other     Beta blockers-Novacaine depression    No current facility-administered medications on file prior to encounter.    Current Outpatient Prescriptions on File Prior to Encounter  Medication Sig  . allopurinol (ZYLOPRIM) 100 MG tablet Take 100 mg by mouth daily.   Marland Kitchen diltiazem (DILACOR XR) 120 MG 24 hr capsule Take 120 mg by mouth at bedtime.  . iron polysaccharides (NIFEREX) 150 MG capsule Take 1 capsule (150 mg total) by  mouth 2 (two) times daily.  Marland Kitchen JANUVIA 100 MG tablet Take 1 tablet by mouth daily.  Marland Kitchen lovastatin (MEVACOR) 20 MG tablet Take 20 mg by mouth every morning.   . metFORMIN (GLUCOPHAGE) 1000 MG tablet Take 1,000 mg by mouth 2 (two) times daily.   . ONE TOUCH ULTRA TEST test strip   . polyethylene glycol (MIRALAX / GLYCOLAX) packet Take 17 g by mouth 2 (two) times daily. (Patient taking differently: Take 17 g by mouth daily. )  . potassium chloride SA (K-DUR,KLOR-CON) 20 MEQ tablet Take 1 tablet by mouth daily.  Marland Kitchen senna-docusate (SENOKOT-S) 8.6-50 MG per tablet Take 1 tablet by mouth at bedtime.  . SYMBICORT 160-4.5 MCG/ACT inhaler Inhale 2 puffs into the lungs 2 (two) times daily.  . temazepam (RESTORIL) 15 MG capsule Take 1 capsule by mouth at bedtime.   . Tiotropium Bromide-Olodaterol (STIOLTO RESPIMAT) 2.5-2.5 MCG/ACT AERS Inhale 2 puffs into the lungs daily. (Patient not taking: Reported on 11/19/2015)  . torsemide (DEMADEX) 20 MG tablet Take 2 tablets (40 mg total) by mouth daily.  . valsartan (DIOVAN) 160 MG tablet Take 160 mg by mouth every morning.   . warfarin (COUMADIN) 5 MG tablet Take 5 mg by mouth every evening.     FAMILY HISTORY:  His indicated that his mother is deceased. He indicated that his father is deceased. He indicated that all of his three sisters are deceased. He indicated that his brother is deceased. He indicated that his maternal grandmother is deceased. He indicated that his maternal grandfather is deceased. He indicated that his paternal grandmother is deceased.  He indicated that his paternal grandfather is deceased. He indicated that the status of his neg hx is unknown.    SOCIAL HISTORY: He  reports that he quit smoking about 39 years ago. His smoking use included Cigarettes. He has a 50.00 pack-year smoking history. He has never used smokeless tobacco. He reports that he does not drink alcohol or use drugs.  REVIEW OF SYSTEMS:   10 point review of system taken,  please see HPI for positives and negatives.  SUBJECTIVE:  NAD post transfusion  VITAL SIGNS: Wt 233 lb 11 oz (106 kg)   BMI 30.83 kg/m   HEMODYNAMICS:    VENTILATOR SETTINGS:    INTAKE / OUTPUT: No intake/output data recorded.  PHYSICAL EXAMINATION: General:  Awake and alert NAD at rest Neuro:  Intact HEENT:  No JVD LAN Cardiovascular:  HSR RRR HR 60 Lungs:  Decreased bs in bases Abdomen: +bs, tender, rectal blakemore noted inflated. Musculoskeletal:  intact Skin: Cool and dry  LABS:  BMET  Recent Labs Lab 12/16/15 1116 12/16/15 1123  NA 137 140  K 4.2 4.2  CL 107 105  CO2 23  --   BUN 16 17  CREATININE 1.23 1.20  GLUCOSE 178* 170*    Electrolytes  Recent Labs Lab 12/16/15 1116  CALCIUM 9.0    CBC  Recent Labs Lab 12/16/15 1116 12/16/15 1123 12/16/15 1141  WBC 5.1  --   --   HGB 9.5* 9.9*  --   HCT 30.9* 29.0*  --   PLT 201  --  197    Coag's  Recent Labs Lab 12/16/15 1116 12/16/15 1141  APTT  --  PENDING  INR 1.66 PENDING    Sepsis Markers No results for input(s): LATICACIDVEN, PROCALCITON, O2SATVEN in the last 168 hours.  ABG No results for input(s): PHART, PCO2ART, PO2ART in the last 168 hours.  Liver Enzymes  Recent Labs Lab 12/16/15 1116  AST 23  ALT 16*  ALKPHOS 72  BILITOT 0.5  ALBUMIN 3.5    Cardiac Enzymes No results for input(s): TROPONINI, PROBNP in the last 168 hours.  Glucose No results for input(s): GLUCAP in the last 168 hours.  Imaging No results found.   STUDIES:    CULTURES:   ANTIBIOTICS:   SIGNIFICANT EVENTS:   LINES/TUBES:   DISCUSSION: 82 yo male , hx bipolar, osa, o2 dependent who had prostate bx last thursday. He is normally on coumadin but off for bx and recently restarted INR 1.28.  He developed rectal bleeding and presented to ER with profuse rectal bleed. Hgb was 9 but due to copious bleeding from rectum. He is stable post transfusion and awake and following commands.  PCCm asked to assist in is care.   ASSESSMENT / PLAN:  PULMONARY A: COPD O2 dependent but not currently not on O2 P:   May be intubated for OR Wean as tolerated  CARDIOVASCULAR A:  Hypotension from blood loss  CAF P:  Hold antihypertensives Monitor HR  RENAL Lab Results  Component Value Date   CREATININE 1.20 12/16/2015   CREATININE 1.23 12/16/2015   CREATININE 1.30 (H) 08/14/2015    Recent Labs Lab 12/16/15 1116 12/16/15 1123  K 4.2 4.2      A:   Multiple transfusion P:   Monitor for hyperkalemia    GASTROINTESTINAL A:   GIB with recent prostate bx P:   Remove rectal blakemore to avoid ischemia PPI CCS to take to OR for evaluation  HEMATOLOGIC  Recent Labs  12/16/15 1116 12/16/15 1123  HGB 9.5* 9.9*    A:   Blood loss anemia P:  Hold anticoagulants FFP Transfusion as needed to keep hgb >8 with active bleed  INFECTIOUS A:   No apparent infection P:   Follow WBC  ENDOCRINE A:   DM P:   SSI  NEUROLOGIC A:   Awake and alert Bipolar Decreased LOC when BP was low P:   RASS goal: 0 Hold all sedation   FAMILY  - Updates: Wifwe at bedside  - Inter-disciplinary family meet or Palliative Care meeting due by:    -APP CCT 54 min  Richardson Landry Minor ACNP Maryanna Shape PCCM Pager [phone] till 3 pm If no answer page [phone] 12/16/2015, 12:20 PM  Attending Note:  83 year old male s/p prostate biopsy who presents with rectal bleeding, hypotension and hemorrhagic shock.  EDP placed a blakemore tube in the patient's rectum to tamponade, transfused and reversed anti-coagulation for a-fib.  On exam, he is alert and interactive.  Chronically on cardizem explaining why he was unable to increase HR in response to bleeding, lungs are clear.  I reviewed CXR myself, no acute complications noted.  Discussed with CCS-MD, will take patient to the OR, intubate, place on stomach and deflate balloon and remove it to evaluate bleeding site.  PCCM will  follow for medical management and management of hemorrhagic shock.  Titrate O2 down since patient is an O2 dependent COPD patient.  PCCM will follow with you.  The patient is critically ill with multiple organ systems failure and requires high complexity decision making for assessment and support, frequent evaluation and titration of therapies, application of advanced monitoring technologies and extensive interpretation of multiple databases.   Critical Care Time devoted to patient care services described in this note is  35  Minutes. This time reflects time of care of this signee Dr Jennet Maduro. This critical care time does not reflect procedure time, or teaching time or supervisory time of PA/NP/Med student/Med Resident etc but could involve care discussion time.  Rush Farmer, M.D. [facility] Pulmonary/Critical Care Medicine. Pager: [phone]. After hours pager: [phone].

## 2015-12-16 NOTE — ED Notes (Signed)
Dr. Yacoub at bedside  

## 2015-12-16 NOTE — ED Triage Notes (Signed)
Pt reports prostate biopsy Thursday, heavy bleeding post procedure, denies bleeding since that time.  Pt reports restarting coumadin Friday pm.  Pt reports normal BM yesterday, reports new bleeding this am.  EMS applying pressure to rectum on arrival, pt actively bleeding on arrival.

## 2015-12-16 NOTE — Op Note (Signed)
Patient Name:           Joel [name]   Date of Surgery:        12/16/2015  Pre op Diagnosis:      Rectal bleeding, status post recent prostate biopsy, hypovolemic shock  Post op Diagnosis:    Same.  Procedure:                 Examination under anesthesia, anoscopy, proctoscopy, insertion of rectal packing  Surgeon:                     Edsel Petrin. Dalbert Batman, M.D., FACS  Assistant:                      OR staff   Indication for Assistant: N/A  Operative Indications:   This is a 82 year old Caucasian male who is on Coumadin because of atrial fibrillation.  Last colonoscopy 3 years ago showing severe left-sided diverticulosis.  No prior history of rectal bleeding.  Coumadin was stopped last week and he had a prostate biopsy on November 9.  He had a lot of bleeding in the office and had to wait about 4 hours before he went home and then did well.  Coumadin was restarted on November 11.  He did well until this morning when he developed continuous bright red rectal bleeding.  He came to the emergency room hypotensive.  A balloon tamp and not was performed by the emergency department physician.  The patient received vitamin K, fresh frozen plasma and PRBC and became somewhat more stable.  He had no abdominal pain.  He was brought to the operating room urgently.  Operative Findings:       There was a lot of dark purple blood in the rectosigmoid colon.  There was no active bleeding site seen.  After washing out the rectosigmoid colon and observing for about 30 minutes there did not seem to be any bleeding coming down from above.  There was minimal hematoma around the prostate biopsy site.  There was not an area where I could place a suture to control any bleeding since there was no active bleeding.  I chose to pack the rectum with perineal packing  Procedure in Detail:          Following the induction of general endotracheal anesthesia the patient was rolled over in a prone jackknife position and  appropriately padded.  The perianal area was prepped and draped in a sterile fashion.  Surgical timeout was performed.  Intravenous antibiotics were given.      I deflated the [place]-Blakemore tube and removed that.  There was some old blood in stool present.  Digital rectal exam revealed that I could palpate a small prostate up high.  I inserted an anus scope and saw old blood and evacuated that.  I dilated the rectum so I could insert a larger anus scope and I saw no active bleeding.  I then performed rigid proctoscopy to almost 25 cm and did a lot of irrigation and lavage until it was clear that there was no blood coming down from above.  I then removed the proctoscope and placed a large operating anoscope in place and again looked for bleeding and saw none.  I felt that the bleeding had stopped because of tamponade  and blood products.  I packed the mid and distal rectum with 2 inch per perineal packing, obvious option that the patient had bled from the  prostate biopsy, and the case was completed.  The patient tolerated procedure well and was taken to PACU in stable condition.  Estimated blood loss was about 300 mL but this seemed to be old blood.  Counts were correct.  Complications none.     Edsel Petrin. Dalbert Batman, M.D., FACS General and Minimally Invasive Surgery Breast and Colorectal Surgery  12/16/2015 1:58 PM

## 2015-12-16 NOTE — H&P (Signed)
[facility] Surgery Consult Note  Joel [name] 06-08-1932  [id number].    Requesting MD: Cardama Chief Complaint/Reason for Consult: Active bleed  HPI:  Joel [name] is an 82yo male who was brought to the ED via EMS earlier today with active bleeding from his rectum. States that he had a prostate biopsy performed 12/11/15 by Dr. Karsten Ro. Did well following the procedure until this morning when he noticed bleeding per rectum in the shower. He called EMS because the bleeding wound not stop. Reports having a BM yesterday that was nonbloody. Denies n/v or abdominal pain. Last meal was last night  ED workup: - alert and oriented on arrival - active bleeding per rectum, tamponade/Blakemore placed by ED MD - massive transfusion protocol initiated - intermittently hypotensive - patient started on IVF, Kcentra, vitamin K, 3 uPRBC, 2 FFP, and  tranexamic acid per rectum - Hg 9.5 - INR 1.66  PMH significant for DM, AS, COPD, HTN, HLD, atrial fibrillation status post Cox-Maze procedure and cardioversion on chronic anticoagulation (coumadin), CHF, ILD w/ home O2 and qhs BiPAP, bipolar Former smoker  ROS: All systems reviewed and otherwise negative except for as above  Family History  Problem Relation Age of Onset  . Tuberculosis Maternal Grandmother   . Heart attack Neg Hx   . Diabetes Neg Hx   . CAD Neg Hx   . Cancer Neg Hx     Past Medical History:  Diagnosis Date  . Allergic rhinitis   . Anemia   . Anxiety   . Aortic stenosis 2012   mild to mod   . Atrial fibrillation ([place]) 1991   with multiple DCCV  . Bipolar affective disorder ([place])   . COPD (chronic obstructive pulmonary disease) ([place])   . Diabetic neuropathy ([place])    in feet  . Dyslipidemia   . Fatigue    last year or so  . Gout   . Hypertension   . Insomnia   . Obesity   . On home oxygen therapy 2 1/2 liters with bipap at night  . OSA (obstructive sleep apnea)    severe, uses bipap 16 1/2 by 12 or 13  setting  . Type 2 diabetes mellitus ([name])   . Vertigo     Past Surgical History:  Procedure Laterality Date  . CARDIOVERSION  yrs ago  . COLONOSCOPY WITH PROPOFOL N/A 01/23/2013   Procedure: COLONOSCOPY WITH PROPOFOL;  Surgeon: Garlan Fair, MD;  Location: [facility];  Service: Endoscopy;  Laterality: N/A;  . electro shock  1969   for depression  . ESOPHAGOGASTRODUODENOSCOPY (EGD) WITH PROPOFOL N/A 01/23/2013   Procedure: ESOPHAGOGASTRODUODENOSCOPY (EGD) WITH PROPOFOL;  Surgeon: Garlan Fair, MD;  Location: [facility];  Service: Endoscopy;  Laterality: N/A;  . KNEE SURGERY  1970's   rt  . MAZE  1998  . SHOULDER SURGERY  7-8 yrs ago   rt    Social History:  reports that he quit smoking about 39 years ago. His smoking use included Cigarettes. He has a 50.00 pack-year smoking history. He has never used smokeless tobacco. He reports that he does not drink alcohol or use drugs.  Allergies:  Allergies  Allergen Reactions  . Antihistamines, Loratadine-Type     depression  . Meclizine     depression  . Other     Beta blockers-Novacaine depression     (Not in a hospital admission)  Weight 233 lb 11 oz (106 kg). Physical Exam: General: WD/WN white male who  is laying in bed in NAD HEENT: head is normocephalic, atraumatic.  Sclera are noninjected.  Ears and nose without any masses or lesions.  Mouth is pink and moist Heart: regular rhythm and rate.  AS murmur noted.  Palpable pedal, femoral, and radial pulses bilaterally Lungs: CTAB, no wheezes, rhonchi, or rales noted.  Respiratory effort nonlabored Abd: obese, soft, NT/ND, +BS, no masses, hernias, or organomegaly MS: all 4 extremities are symmetrical with no cyanosis, clubbing, or edema. Skin: warm and dry with no masses, lesions, or rashes Psych: A&Ox3 with an appropriate affect. Neuro: CM 2-12 intact, extremity CSM intact bilaterally, normal speech Rectum: Blakemore/tamponade in place  Results for orders placed  or performed during the hospital encounter of 12/16/15 (from the past 48 hour(s))  Type and screen     Status: None (Preliminary result)   Collection Time: 12/16/15 11:03 AM  Result Value Ref Range   ABO/RH(D) PENDING    Antibody Screen PENDING    Sample Expiration 12/19/2015    Unit Number [id number]    Blood Component Type RED CELLS,LR    Unit division 00    Status of Unit ISSUED    Unit tag comment VERBAL ORDERS PER DR CARDEMA    Transfusion Status OK TO TRANSFUSE    Crossmatch Result PENDING    Unit Number [id number]    Blood Component Type RED CELLS,LR    Unit division 00    Status of Unit ISSUED    Unit tag comment VERBAL ORDERS PER DR CARDEMA    Transfusion Status OK TO TRANSFUSE    Crossmatch Result PENDING   Prepare fresh frozen plasma     Status: None (Preliminary result)   Collection Time: 12/16/15 11:03 AM  Result Value Ref Range   Unit Number [id number]    Blood Component Type LIQ PLASMA    Unit division 00    Status of Unit ISSUED    Unit tag comment VERBAL ORDERS PER DR CARDEMA    Transfusion Status OK TO TRANSFUSE    Unit Number [id number]    Blood Component Type LIQ PLASMA    Unit division 00    Status of Unit ISSUED    Unit tag comment VERBAL ORDERS PER DR CARDEMA    Transfusion Status OK TO TRANSFUSE    No results found.    Assessment/Plan Active bleeding per rectum following prostate biopsy - s/p prostate biopsy 11/9 by Dr. Karsten Ro - patient is on coumadin at home for Afib - bleeding per rectum began this AM - initially unstable in the ED  - tamponade/Blakemore placed by ED MD - patient started on IVF, Kcentra, vitamin K, 3 uPRBC, 2 FFP, and  tranexamic acid per rectum - Hg 9.5 - INR 1.66  DM AS COPD HTN HLD atrial fibrillation status post Cox-Maze procedure and cardioversion on chronic anticoagulation (coumadin) CHF ILD w/ home O2 and qhs BiPAP Bipolar  Plan - admit to ICU. plan to take patient to OR today for  proctoscopy/control of bleeders. Consult medicine for assistance with multiple medical problems.  Jerrye Beavers, [facility] Surgery 12/16/2015, 11:18 AM Pager: [phone] Consults: [phone] Mon-Fri 7:00 am-4:30 pm Sat-Sun 7:00 am-11:30 am

## 2015-12-16 NOTE — Anesthesia Postprocedure Evaluation (Signed)
Anesthesia Post Note  Patient: Joel [name]  Procedure(s) Performed: Procedure(s) (LRB): PROCTOSCOPY WITH CONTROL OF BLEEDING (N/A)  Patient location during evaluation: PACU Anesthesia Type: General Level of consciousness: awake Pain management: pain level controlled Vital Signs Assessment: post-procedure vital signs reviewed and stable Respiratory status: spontaneous breathing Cardiovascular status: stable Postop Assessment: no signs of nausea or vomiting Anesthetic complications: no    Last Vitals:  Vitals:   12/16/15 1645 12/16/15 1700  BP:  118/74  Pulse: 66 74  Resp: (!) 23 (!) 21  Temp:      Last Pain:  Vitals:   12/16/15 1445  TempSrc:   PainSc: Asleep                 Casady Voshell

## 2015-12-16 NOTE — ED Notes (Signed)
This RN holding pressure to control bleeding, at rectum

## 2015-12-16 NOTE — Transfer of Care (Signed)
Immediate Anesthesia Transfer of Care Note  Patient: Joel [name]  Procedure(s) Performed: Procedure(s): PROCTOSCOPY WITH CONTROL OF BLEEDING (N/A)  Patient Location: PACU  Anesthesia Type:General  Level of Consciousness: awake, alert , oriented and patient cooperative  Airway & Oxygen Therapy: Patient Spontanous Breathing and Patient connected to nasal cannula oxygen  Post-op Assessment: Report given to RN and Post -op Vital signs reviewed and stable  Post vital signs: Reviewed and stable  Last Vitals:  Vitals:   12/16/15 1235 12/16/15 1240  BP: (!) 110/50 112/58  Pulse: (!) 59 60  Resp: 15 23  Temp:      Last Pain:  Vitals:   12/16/15 1203  TempSrc: Axillary         Complications: No apparent anesthesia complications

## 2015-12-16 NOTE — Consult Note (Signed)
Referring Provider:  Dr. Evlyn Courier ([place]) Primary Care Physician:  Wenda Low, MD Primary Gastroenterologist:  Dr. Howell Rucks  Reason for Consultation:  Hematochezia  HPI: (history from chart--pt has been taken to OR)   Joel [name] is a 82 y.o. male 5 days s/p prostate bx, on coumadin for a.fib which was restarted 2 d post bx, who today developed severe hematochezia and hypotension and was Tx'd 2 u prc's and 2 u FFP in ER, w/ INR  1.66 and hgb 9.5 (pre-transfusion).    Pt had colonoscopy 01/2013 (Dr. Howell Rucks):  Small aden p, SEVERE LEFT SIDED DIVERTICULOSIS.  Tamponade in rectum achieved w/ rectal balloon in ER, and pt taken to OR by Dr. Dalbert Batman for possible intervention on bx sites.     Past Medical History:  Diagnosis Date  . Allergic rhinitis   . Anemia   . Anxiety   . Aortic stenosis 2012   mild to mod   . Atrial fibrillation ([place]) 1991   with multiple DCCV  . Bipolar affective disorder ([place])   . COPD (chronic obstructive pulmonary disease) ([place])   . Diabetic neuropathy ([place])    in feet  . Dyslipidemia   . Fatigue    last year or so  . Gout   . Hypertension   . Insomnia   . Obesity   . On home oxygen therapy 2 1/2 liters with bipap at night  . OSA (obstructive sleep apnea)    severe, uses bipap 16 1/2 by 12 or 13 setting  . Type 2 diabetes mellitus ([place])   . Vertigo     Past Surgical History:  Procedure Laterality Date  . CARDIOVERSION  yrs ago  . COLONOSCOPY WITH PROPOFOL N/A 01/23/2013   Procedure: COLONOSCOPY WITH PROPOFOL;  Surgeon: Garlan Fair, MD;  Location: [facility];  Service: Endoscopy;  Laterality: N/A;  . electro shock  1969   for depression  . ESOPHAGOGASTRODUODENOSCOPY (EGD) WITH PROPOFOL N/A 01/23/2013   Procedure: ESOPHAGOGASTRODUODENOSCOPY (EGD) WITH PROPOFOL;  Surgeon: Garlan Fair, MD;  Location: [facility];  Service: Endoscopy;  Laterality: N/A;  . KNEE SURGERY  1970's   rt  . MAZE  1998  . PROSTATE BIOPSY     . SHOULDER SURGERY  7-8 yrs ago   rt    Prior to Admission medications   Medication Sig Start Date End Date Taking? Authorizing Provider  allopurinol (ZYLOPRIM) 100 MG tablet Take 100 mg by mouth daily.     Historical Provider, MD  diltiazem (DILACOR XR) 120 MG 24 hr capsule Take 120 mg by mouth at bedtime.    Historical Provider, MD  iron polysaccharides (NIFEREX) 150 MG capsule Take 1 capsule (150 mg total) by mouth 2 (two) times daily. 10/23/14   Eugenie Filler, MD  JANUVIA 100 MG tablet Take 1 tablet by mouth daily. 06/15/15   Historical Provider, MD  lovastatin (MEVACOR) 20 MG tablet Take 20 mg by mouth every morning.     Historical Provider, MD  metFORMIN (GLUCOPHAGE) 1000 MG tablet Take 1,000 mg by mouth 2 (two) times daily.  05/17/14   Historical Provider, MD  ONE TOUCH ULTRA TEST test strip  11/11/15   Historical Provider, MD  polyethylene glycol (MIRALAX / GLYCOLAX) packet Take 17 g by mouth 2 (two) times daily. Patient taking differently: Take 17 g by mouth daily.  10/23/14   Eugenie Filler, MD  potassium chloride SA (K-DUR,KLOR-CON) 20 MEQ tablet Take 1 tablet by mouth daily. 09/10/14  Historical Provider, MD  senna-docusate (SENOKOT-S) 8.6-50 MG per tablet Take 1 tablet by mouth at bedtime. 10/23/14   Eugenie Filler, MD  SYMBICORT 160-4.5 MCG/ACT inhaler Inhale 2 puffs into the lungs 2 (two) times daily. 07/07/15   Historical Provider, MD  temazepam (RESTORIL) 15 MG capsule Take 1 capsule by mouth at bedtime.  04/07/14   Historical Provider, MD  Tiotropium Bromide-Olodaterol (STIOLTO RESPIMAT) 2.5-2.5 MCG/ACT AERS Inhale 2 puffs into the lungs daily. Patient not taking: Reported on 11/19/2015 04/19/14   Deneise Lever, MD  torsemide (DEMADEX) 20 MG tablet Take 2 tablets (40 mg total) by mouth daily. 10/24/15 01/22/16  Minus Breeding, MD  valsartan (DIOVAN) 160 MG tablet Take 160 mg by mouth every morning.     Historical Provider, MD  warfarin (COUMADIN) 5 MG tablet Take 5 mg by  mouth every evening.     Historical Provider, MD    Current Facility-Administered Medications  Medication Dose Route Frequency Provider Last Rate Last Dose  . acetaminophen (TYLENOL) tablet 650 mg  650 mg Oral Q6H PRN Jerrye Beavers, PA-C       Or  . acetaminophen (TYLENOL) suppository 650 mg  650 mg Rectal Q6H PRN Jerrye Beavers, PA-C      . diphenhydrAMINE (BENADRYL) 12.5 MG/5ML elixir 12.5 mg  12.5 mg Oral Q6H PRN Jerrye Beavers, PA-C       Or  . diphenhydrAMINE (BENADRYL) injection 12.5 mg  12.5 mg Intravenous Q6H PRN Jerrye Beavers, PA-C      . ondansetron (ZOFRAN-ODT) disintegrating tablet 4 mg  4 mg Oral Q6H PRN Jerrye Beavers, PA-C       Or  . ondansetron (ZOFRAN) injection 4 mg  4 mg Intravenous Q6H PRN Jerrye Beavers, PA-C      . oxyCODONE (Oxy IR/ROXICODONE) immediate release tablet 5-10 mg  5-10 mg Oral Q4H PRN Jerrye Beavers, PA-C       Current Outpatient Prescriptions  Medication Sig Dispense Refill  . allopurinol (ZYLOPRIM) 100 MG tablet Take 100 mg by mouth daily.     Marland Kitchen diltiazem (DILACOR XR) 120 MG 24 hr capsule Take 120 mg by mouth at bedtime.    . iron polysaccharides (NIFEREX) 150 MG capsule Take 1 capsule (150 mg total) by mouth 2 (two) times daily. 60 capsule 0  . JANUVIA 100 MG tablet Take 1 tablet by mouth daily.  1  . lovastatin (MEVACOR) 20 MG tablet Take 20 mg by mouth every morning.     . metFORMIN (GLUCOPHAGE) 1000 MG tablet Take 1,000 mg by mouth 2 (two) times daily.   0  . ONE TOUCH ULTRA TEST test strip   1  . polyethylene glycol (MIRALAX / GLYCOLAX) packet Take 17 g by mouth 2 (two) times daily. (Patient taking differently: Take 17 g by mouth daily. ) 14 each 0  . potassium chloride SA (K-DUR,KLOR-CON) 20 MEQ tablet Take 1 tablet by mouth daily.  0  . senna-docusate (SENOKOT-S) 8.6-50 MG per tablet Take 1 tablet by mouth at bedtime.    . SYMBICORT 160-4.5 MCG/ACT inhaler Inhale 2 puffs into the lungs 2 (two) times daily.  0  . temazepam (RESTORIL)  15 MG capsule Take 1 capsule by mouth at bedtime.   0  . Tiotropium Bromide-Olodaterol (STIOLTO RESPIMAT) 2.5-2.5 MCG/ACT AERS Inhale 2 puffs into the lungs daily. (Patient not taking: Reported on 11/19/2015) 1 Inhaler 0  . torsemide (DEMADEX) 20 MG tablet Take 2 tablets (40 mg total)  by mouth daily. 60 tablet 11  . valsartan (DIOVAN) 160 MG tablet Take 160 mg by mouth every morning.     . warfarin (COUMADIN) 5 MG tablet Take 5 mg by mouth every evening.       Allergies as of 12/16/2015 - Review Complete 12/16/2015  Allergen Reaction Noted  . Antihistamines, loratadine-type  01/18/2013  . Meclizine  01/18/2013  . Other  01/18/2013    Family History  Problem Relation Age of Onset  . Tuberculosis Maternal Grandmother   . Heart attack Neg Hx   . Diabetes Neg Hx   . CAD Neg Hx   . Cancer Neg Hx     Social History   Social History  . Marital status: Married    Spouse name: N/A  . Number of children: N/A  . Years of education: N/A   Occupational History  . Surveyor    Social History Main Topics  . Smoking status: Former Smoker    Packs/day: 2.00    Years: 25.00    Types: Cigarettes    Quit date: 07/08/1976  . Smokeless tobacco: Never Used  . Alcohol use No     Comment: in AA since 1977, no alcohol since 1977  . Drug use: No     Comment: marijuana use 37 years ago  . Sexual activity: Not on file   Other Topics Concern  . Not on file   Social History Narrative  . No narrative on file    Review of Systems: unobtainable at present  Physical Exam:  Exam pending pt availability--has been taken to OR Vital signs in last 24 hours: Temp:  [97.2 F (36.2 C)-97.6 F (36.4 C)] 97.4 F (36.3 C) (11/14 1203) Pulse Rate:  [52-74] 60 (11/14 1240) Resp:  [12-24] 23 (11/14 1240) BP: (76-125)/(46-72) 112/58 (11/14 1240) SpO2:  [100 %] 100 % (11/14 1240) Weight:  [106 kg (233 lb 11 oz)] 106 kg (233 lb 11 oz) (11/14 1100)     Intake/Output from previous day: No intake/output  data recorded. Intake/Output this shift: Total I/O In: 3371 [Blood:1371; IV Piggyback:2000] Out: -   Lab Results:  Recent Labs  12/16/15 1116 12/16/15 1123 12/16/15 1141  WBC 5.1  --   --   HGB 9.5* 9.9*  --   HCT 30.9* 29.0*  --   PLT 201  --  197   BMET  Recent Labs  12/16/15 1116 12/16/15 1123  NA 137 140  K 4.2 4.2  CL 107 105  CO2 23  --   GLUCOSE 178* 170*  BUN 16 17  CREATININE 1.23 1.20  CALCIUM 9.0  --    LFT  Recent Labs  12/16/15 1116  PROT 6.2*  ALBUMIN 3.5  AST 23  ALT 16*  ALKPHOS 72  BILITOT 0.5   PT/INR  Recent Labs  12/16/15 1116 12/16/15 1141  LABPROT 19.8* 19.4*  INR 1.66 1.61    Studies/Results: No results found.  Impression: 1.   Acute LGIB--?prostate bx site vs diverticular origin; other, less likely possibilities include stercoral ulceration, ulcerated hemorrhoid, colitis, vascular ectasia, neoplasia 2.   Chronic anticoagulation 3,   OSA, O2 dependency per record  Plan: 1.  Await OR findings 2.  If bld appears to be coming from above, would consider unprepped flex sig, vs. Empiric treatment for presumed diverticular bleeding 3.  Call me at any time to discuss.   LOS: 0 days   Colinda Barth V  12/16/2015, 12:59 PM   Pager [phone] If  no answer or after 5 PM call [phone]

## 2015-12-16 NOTE — ED Notes (Signed)
Went and picked up blood for mass transfusion

## 2015-12-16 NOTE — ED Provider Notes (Signed)
[facility] DEPT Provider Note   CSN: [id number] Arrival date & time: 12/16/15  1056     History   Chief Complaint Chief Complaint  Patient presents with  . Rectal Bleeding    HPI Joel [name] is a 82 y.o. male.  HPI 82 year old male with a history of A. fib on Coumadin who recently had a prostate biopsy by urology presented to the emergency department with bright red blood per rectum. Patient reports that following the biopsy the patient had complications with bleeding however this resolved. Patient has had a preprocedure hiatus of Coumadin and resumed Coumadin 2 days after the procedure. Patient was doing well until this morning when he noted significant bleeding. Patient called EMS and was transported to the emergency department. EMS noted the patient remained hemodynamically stable in route but had brisk lower GI bleed. Upon arrival, patient was alert and oriented 4 without any other physical complaints.  Patient was unsure of the radius diagnosis of diverticulosis.  Past Medical History:  Diagnosis Date  . Allergic rhinitis   . Anemia   . Anxiety   . Aortic stenosis 2012   mild to mod   . Atrial fibrillation ([place]) 1991   with multiple DCCV  . Bipolar affective disorder ([place])   . COPD (chronic obstructive pulmonary disease) ([place])   . Diabetic neuropathy ([place])    in feet  . Dyslipidemia   . Fatigue    last year or so  . Gout   . Hypertension   . Insomnia   . Obesity   . On home oxygen therapy 2 1/2 liters with bipap at night  . OSA (obstructive sleep apnea)    severe, uses bipap 16 1/2 by 12 or 13 setting  . Rectal bleeding 12/16/2015  . Type 2 diabetes mellitus ([place])   . Vertigo     Patient Active Problem List   Diagnosis Date Noted  . Bright red rectal bleeding 12/16/2015  . Rectal bleeding 12/16/2015  . COPD (chronic obstructive pulmonary disease) with acute bronchitis ([place]) 06/29/2015  . Pulmonary hypertension 06/29/2015  . Fluid overload  06/29/2015  . Acute CHF ([place]) 06/29/2015  . COPD exacerbation ([place]) 06/29/2015  . Anemia 10/22/2014  . Chest pain 08/04/2013  . Moderate aortic stenosis 04/17/2013  . Encounter for monitoring coumadin therapy 04/17/2013  . Anemia, iron deficiency 04/04/2013  . Dyspnea on exertion 11/18/2012  . Obesity hypoventilation syndrome ([place]) 09/19/2012  . ALLERGIC RHINITIS 06/24/2009  . DM2 (diabetes mellitus, type 2) ([place]) 03/13/2007  . Obstructive sleep apnea 03/13/2007  . Atrial fibrillation ([place]) 03/13/2007  . COPD mixed type ([place]) 03/13/2007    Past Surgical History:  Procedure Laterality Date  . CARDIOVERSION  yrs ago  . COLONOSCOPY WITH PROPOFOL N/A 01/23/2013   Procedure: COLONOSCOPY WITH PROPOFOL;  Surgeon: Garlan Fair, MD;  Location: [facility];  Service: Endoscopy;  Laterality: N/A;  . electro shock  1969   for depression  . ESOPHAGOGASTRODUODENOSCOPY (EGD) WITH PROPOFOL N/A 01/23/2013   Procedure: ESOPHAGOGASTRODUODENOSCOPY (EGD) WITH PROPOFOL;  Surgeon: Garlan Fair, MD;  [facility];  Service: Endoscopy;  Laterality: N/A;  . KNEE SURGERY  1970's   rt  . MAZE  1998  . PROSTATE BIOPSY    . SHOULDER SURGERY  7-8 yrs ago   rt       Home Medications    Prior to Admission medications   Medication Sig Start Date End Date Taking? Authorizing Provider  allopurinol (ZYLOPRIM) 100 MG tablet Take 100  mg by mouth daily.     Historical Provider, MD  diltiazem (DILACOR XR) 120 MG 24 hr capsule Take 120 mg by mouth at bedtime.    Historical Provider, MD  iron polysaccharides (NIFEREX) 150 MG capsule Take 1 capsule (150 mg total) by mouth 2 (two) times daily. 10/23/14   Eugenie Filler, MD  JANUVIA 100 MG tablet Take 1 tablet by mouth daily. 06/15/15   Historical Provider, MD  lovastatin (MEVACOR) 20 MG tablet Take 20 mg by mouth every morning.     Historical Provider, MD  metFORMIN (GLUCOPHAGE) 1000 MG tablet Take 1,000 mg by mouth 2 (two) times daily.  05/17/14    Historical Provider, MD  ONE TOUCH ULTRA TEST test strip  11/11/15   Historical Provider, MD  polyethylene glycol (MIRALAX / GLYCOLAX) packet Take 17 g by mouth 2 (two) times daily. Patient taking differently: Take 17 g by mouth daily.  10/23/14   Eugenie Filler, MD  potassium chloride SA (K-DUR,KLOR-CON) 20 MEQ tablet Take 1 tablet by mouth daily. 09/10/14   Historical Provider, MD  senna-docusate (SENOKOT-S) 8.6-50 MG per tablet Take 1 tablet by mouth at bedtime. 10/23/14   Eugenie Filler, MD  SYMBICORT 160-4.5 MCG/ACT inhaler Inhale 2 puffs into the lungs 2 (two) times daily. 07/07/15   Historical Provider, MD  temazepam (RESTORIL) 15 MG capsule Take 1 capsule by mouth at bedtime.  04/07/14   Historical Provider, MD  Tiotropium Bromide-Olodaterol (STIOLTO RESPIMAT) 2.5-2.5 MCG/ACT AERS Inhale 2 puffs into the lungs daily. Patient not taking: Reported on 11/19/2015 04/19/14   Deneise Lever, MD  torsemide (DEMADEX) 20 MG tablet Take 2 tablets (40 mg total) by mouth daily. 10/24/15 01/22/16  Minus Breeding, MD  valsartan (DIOVAN) 160 MG tablet Take 160 mg by mouth every morning.     Historical Provider, MD  warfarin (COUMADIN) 5 MG tablet Take 5 mg by mouth every evening.     Historical Provider, MD    Family History Family History  Problem Relation Age of Onset  . Tuberculosis Maternal Grandmother   . Heart attack Neg Hx   . Diabetes Neg Hx   . CAD Neg Hx   . Cancer Neg Hx     Social History Social History  Substance Use Topics  . Smoking status: Former Smoker    Packs/day: 2.00    Years: 25.00    Types: Cigarettes    Quit date: 07/08/1976  . Smokeless tobacco: Never Used  . Alcohol use No     Comment: in AA since 1977, no alcohol since 1977     Allergies   Antihistamines, loratadine-type; Meclizine; and Other   Review of Systems Review of Systems Ten systems are reviewed and are negative for acute change except as noted in the HPI   Physical Exam Updated Vital  Signs BP (!) 118/57   Pulse 73   Temp 97.5 F (36.4 C)   Resp 14   Wt 233 lb 11 oz (106 kg)   SpO2 100%   BMI 30.83 kg/m   Physical Exam  Constitutional: He is oriented to person, place, and time. He appears well-developed and well-nourished. No distress.  HENT:  Head: Normocephalic and atraumatic.  Nose: Nose normal.  Eyes: Conjunctivae and EOM are normal. Pupils are equal, round, and reactive to light. Right eye exhibits no discharge. Left eye exhibits no discharge. No scleral icterus.  Neck: Normal range of motion. Neck supple.  Cardiovascular: Normal rate and regular rhythm.  Exam  reveals no gallop and no friction rub.   No murmur heard. Pulmonary/Chest: Effort normal and breath sounds normal. No stridor. No respiratory distress. He has no rales.  Abdominal: Soft. He exhibits no distension. There is no tenderness.  Genitourinary: Rectum normal. Rectal exam shows no external hemorrhoid and no tenderness. Prostate is enlarged. Prostate is not tender.  Genitourinary Comments: Brisk bleeding from the rectum.  Musculoskeletal: He exhibits no edema or tenderness.  Neurological: He is alert and oriented to person, place, and time.  Skin: Skin is warm and dry. No rash noted. He is not diaphoretic. No erythema.  Psychiatric: He has a normal mood and affect.  Vitals reviewed.    ED Treatments / Results  Labs (all labs ordered are listed, but only abnormal results are displayed) Labs Reviewed  CBC WITH DIFFERENTIAL/PLATELET - Abnormal; Notable for the following:       Result Value   RBC 3.48 (*)    Hemoglobin 9.5 (*)    HCT 30.9 (*)    RDW 18.1 (*)    All other components within normal limits  COMPREHENSIVE METABOLIC PANEL - Abnormal; Notable for the following:    Glucose, Bld 178 (*)    Total Protein 6.2 (*)    ALT 16 (*)    GFR calc non Af Amer 53 (*)    All other components within normal limits  PROTIME-INR - Abnormal; Notable for the following:    Prothrombin Time 19.8  (*)    All other components within normal limits  DIC (DISSEMINATED INTRAVASCULAR COAGULATION) PANEL - Abnormal; Notable for the following:    Prothrombin Time 19.4 (*)    aPTT 37 (*)    D-Dimer, Quant 0.61 (*)    All other components within normal limits  GLUCOSE, CAPILLARY - Abnormal; Notable for the following:    Glucose-Capillary 169 (*)    All other components within normal limits  CBC - Abnormal; Notable for the following:    WBC 10.7 (*)    RBC 3.70 (*)    Hemoglobin 9.9 (*)    HCT 32.0 (*)    RDW 19.1 (*)    Platelets 136 (*)    All other components within normal limits  GLUCOSE, CAPILLARY - Abnormal; Notable for the following:    Glucose-Capillary 168 (*)    All other components within normal limits  I-STAT CHEM 8, ED - Abnormal; Notable for the following:    Glucose, Bld 170 (*)    Calcium, Ion 1.13 (*)    Hemoglobin 9.9 (*)    HCT 29.0 (*)    All other components within normal limits  MRSA PCR SCREENING  PROTIME-INR  CBC  BASIC METABOLIC PANEL  APTT  PROTIME-INR  TYPE AND SCREEN  PREPARE FRESH FROZEN PLASMA  PREPARE RBC (CROSSMATCH)  PREPARE FRESH FROZEN PLASMA  MASSIVE TRANSFUSION PROTOCOL ORDER (BLOOD BANK NOTIFICATION)  ABO/RH    EKG  EKG Interpretation None       Radiology No results found.  Procedures Procedures (including critical care time) CRITICAL CARE Performed by: Grayce Sessions Pualani Borah Total critical care time: 75 minutes Critical care time was exclusive of separately billable procedures and treating other patients. Critical care was necessary to treat or prevent imminent or life-threatening deterioration. Critical care was time spent personally by me on the following activities: development of treatment plan with patient and/or surrogate as well as nursing, discussions with consultants, evaluation of patient's response to treatment, examination of patient, obtaining history from patient or surrogate, ordering and  performing treatments  and interventions, ordering and review of laboratory studies, ordering and review of radiographic studies, pulse oximetry and re-evaluation of patient's condition.   Medications Ordered in ED Medications  mometasone-formoterol (DULERA) 200-5 MCG/ACT inhaler 2 puff (not administered)  linagliptin (TRADJENTA) tablet 5 mg (not administered)  iron polysaccharides (NIFEREX) capsule 150 mg (not administered)  potassium chloride SA (K-DUR,KLOR-CON) CR tablet 20 mEq (not administered)  metFORMIN (GLUCOPHAGE) tablet 1,000 mg (not administered)  diltiazem (DILACOR XR) 24 hr capsule 120 mg (not administered)  irbesartan (AVAPRO) tablet 37.5 mg (not administered)  torsemide (DEMADEX) tablet 40 mg (not administered)  0.9 %  sodium chloride infusion ( Intravenous Rate/Dose Verify 12/16/15 1700)  morphine 2 MG/ML injection 1-2 mg (not administered)  famotidine (PEPCID) IVPB 20 mg premix (20 mg Intravenous Given 12/16/15 1808)  insulin aspart (novoLOG) injection 0-20 Units (4 Units Subcutaneous Given 12/16/15 1604)  fentaNYL (SUBLIMAZE) 100 MCG/2ML injection (not administered)  prothrombin complex conc human (KCENTRA) IVPB 2,660 Units (2,660 Units Intravenous Given 12/16/15 1125)  phytonadione (VITAMIN K) 10 mg in dextrose 5 % 50 mL IVPB (0 mg Intravenous Stopped 12/16/15 1229)  sodium chloride 0.9 % bolus 1,000 mL (0 mLs Intravenous Stopped 12/16/15 1212)  tranexamic acid (CYKLOKAPRON) injection 1,000 mg (1,000 mg Topical Given by Other 12/16/15 1130)  sodium chloride 0.9 % bolus 1,000 mL (0 mLs Intravenous Stopped 12/16/15 1214)  lactated ringers infusion ( Intravenous New Bag/Given 12/16/15 1301)     Initial Impression / Assessment and Plan / ED Course  I have reviewed the triage vital signs and the nursing notes.  Pertinent labs & imaging results that were available during my care of the patient were reviewed by me and considered in my medical decision making (see chart for details).  Clinical  Course     Initially the patient was hemodynamically stable. Nursing attempted pressure control of the rectum with gauze. Direct digital pressure to the prostate was applied. Eppie Gibson was ordered and given. Patient had continued bleeding and became hypotensive with systolic blood pressures in the 70s. He became transiently unresponsive; however recovered quickly. Patient placed on nonrebreather. Massive blood transfusion protocol was initiated. Patient received several units of packed red blood cells, FFP and platelets. [place] balloon was inserted into the rectum and inflated with approximately 1 75 mL of normal saline.1000 milligrams of TXA was infused through the aspiration port.  Significant improvement in patient's blood pressure.   Consulted general surgery who evaluated the patient in the emergency department. Also discussed the case with GI, and the patient's urologist who agreed that tamponading the bleed and reversing anticoagulation was warranted.  General surgery requested critical care consultation. Gen. surgery plans to take the patient to the OR to do visualization and hemostatic control as needed.   Final Clinical Impressions(s) / ED Diagnoses   Final diagnoses:  Postoperative hemorrhagic shock, initial encounter    Disposition: Admit  Condition: serious    Fatima Blank, MD 12/16/15 [phone]

## 2015-12-16 NOTE — Final Consult Note (Signed)
Consultant Final Sign-Off Note    Assessment/Final recommendations  Joel [name] is a 82 y.o. male followed by me for rectal bleeding following prostate biopsy.  The patient appears to be stabilized and feeling much better.  Appreciate the collaborative effort by those involved to help resuscitate this patient and control his bleeding.  His final pathology from his prostate biopsy is pending.  The patient has follow-up scheduled to review the results with Dr. Karsten Ro.  We will plan to see this patient as needed while he is in the hospital.  Please contact us again with any additional questions/concerns.   Wound care (if applicable):    Diet at discharge: per primary team   Activity at discharge: per primary team   Follow-up appointment:   November 27th at 1:30 with Dr. Karsten Ro to discuss pathology results.  Pending results:  Unresulted Labs    Start     Ordered   12/17/15 0500  CBC  Daily,   R     12/16/15 1519   12/17/15 [id number]  Basic metabolic panel  Daily,   R     12/16/15 1519   12/17/15 0500  APTT  Tomorrow morning,   R     12/16/15 1519   12/17/15 0500  Protime-INR  Tomorrow morning,   R     12/16/15 1519       Medication recommendations:   Other recommendations:    Thank you for allowing [place] to participate in the care of your patient!  Please consult [place] again if you have further needs for your patient.  Ardis Hughs 12/16/2015 11:14 PM    Subjective     Objective  Vital signs in last 24 hours: Temp:  [97.2 F (36.2 C)-98.5 F (36.9 C)] 98.5 F (36.9 C) (11/14 2044) Pulse Rate:  [52-122] 72 (11/14 2214) Resp:  [9-24] 18 (11/14 2214) BP: (76-125)/(46-76) 121/63 (11/14 2214) SpO2:  [97 %-100 %] 97 % (11/14 2214) Weight:  [106 kg (233 lb 11 oz)] 106 kg (233 lb 11 oz) (11/14 1100)  General: NAD    Pertinent labs and Studies:  Recent Labs  12/16/15 1116 12/16/15 1123 12/16/15 1344 12/16/15 1705  WBC 5.1  --   --  10.7*  HGB 9.5* 9.9*  10.5* 9.9*  HCT 30.9* 29.0* 31.0* 32.0*   BMET  Recent Labs  12/16/15 1116 12/16/15 1123 12/16/15 1344  NA 137 140 142  K 4.2 4.2 5.3*  CL 107 105  --   CO2 23  --   --   GLUCOSE 178* 170* 179*  BUN 16 17  --   CREATININE 1.23 1.20  --   CALCIUM 9.0  --   --    No results for input(s): LABURIN in the last 72 hours. Results for orders placed or performed during the hospital encounter of 12/16/15  MRSA PCR Screening     Status: None   Collection Time: 12/16/15  5:39 PM  Result Value Ref Range Status   MRSA by PCR NEGATIVE NEGATIVE Final    Comment:        The GeneXpert MRSA Assay (FDA approved for NASAL specimens only), is one component of a comprehensive MRSA colonization surveillance program. It is not intended to diagnose MRSA infection nor to guide or monitor treatment for MRSA infections.     Imaging: No results found.

## 2015-12-16 NOTE — ED Notes (Signed)
Dr. Leonette Monarch holding pressure of site of bleeding, at rectum

## 2015-12-16 NOTE — Progress Notes (Signed)
ADDENDUM TO PREVIOUS NOTE:  Pt is s/p transanal inspection/rectal packing in OR by Dr. Dalbert Batman.  EXAM:  NAD, awake and alert, skin warm, chest clear, heart normal, abdomen obese but nontender.  No significant lower extremity edema.  IMPR:  Probable post-biopsy hemorrhage.  Much less likely diverticular origin.  RECOMMENDATIONS:  1. Continue observation. 2. In event of significant re-bleed, consider bleeding scan to check for diverticular source. 3. In view of intra-operative findings, I will sign off since it does not appear that input or management from me will be needed--however, please call if I can be of further assistance in this patient's care.    Cleotis Nipper, M.D. Pager [phone] If no answer or after 5 PM call [phone]

## 2015-12-16 NOTE — Progress Notes (Signed)
[facility] Progress Note Patient Name: Joel [name] DOB: May 02, 1932 MRN: [id number]   Date of Service  12/16/2015  HPI/Events of Note  OSA - patient on BiPAP QHS. IPAP 16 and EPAP 12.   eICU Interventions  Will order BiPAP QHS as above.      Intervention Category Intermediate Interventions: Other:  Lysle Dingwall 12/16/2015, 7:09 PM

## 2015-12-16 NOTE — Progress Notes (Signed)
Pharmacy called because medication has not yet been verified. Patient request to not receive diuretics this evening as he will be urinating all night long.

## 2015-12-16 NOTE — ED Notes (Signed)
Code cart and airway cart brought to room

## 2015-12-17 ENCOUNTER — Encounter (HOSPITAL_COMMUNITY): Payer: Self-pay | Admitting: General Surgery

## 2015-12-17 DIAGNOSIS — E662 Morbid (severe) obesity with alveolar hypoventilation: Secondary | ICD-10-CM | POA: Diagnosis not present

## 2015-12-17 DIAGNOSIS — K625 Hemorrhage of anus and rectum: Secondary | ICD-10-CM | POA: Diagnosis not present

## 2015-12-17 DIAGNOSIS — J410 Simple chronic bronchitis: Secondary | ICD-10-CM | POA: Diagnosis not present

## 2015-12-17 DIAGNOSIS — G4733 Obstructive sleep apnea (adult) (pediatric): Secondary | ICD-10-CM | POA: Diagnosis not present

## 2015-12-17 LAB — GLUCOSE, CAPILLARY
GLUCOSE-CAPILLARY: 115 mg/dL — AB (ref 65–99)
GLUCOSE-CAPILLARY: 132 mg/dL — AB (ref 65–99)
GLUCOSE-CAPILLARY: 157 mg/dL — AB (ref 65–99)
Glucose-Capillary: 119 mg/dL — ABNORMAL HIGH (ref 65–99)
Glucose-Capillary: 159 mg/dL — ABNORMAL HIGH (ref 65–99)
Glucose-Capillary: 187 mg/dL — ABNORMAL HIGH (ref 65–99)
Glucose-Capillary: 165 mg/dL — ABNORMAL HIGH (ref 65–99)

## 2015-12-17 LAB — PREPARE FRESH FROZEN PLASMA
UNIT DIVISION: 0
UNIT DIVISION: 0
UNIT DIVISION: 0
UNIT DIVISION: 0
UNIT DIVISION: 0
UNIT DIVISION: 0
Unit division: 0
Unit division: 0
Unit division: 0
Unit division: 0
Unit division: 0
Unit division: 0
Unit division: 0
Unit division: 0

## 2015-12-17 LAB — BASIC METABOLIC PANEL
ANION GAP: 6 (ref 5–15)
BUN: 14 mg/dL (ref 6–20)
CALCIUM: 8.1 mg/dL — AB (ref 8.9–10.3)
CO2: 23 mmol/L (ref 22–32)
Chloride: 111 mmol/L (ref 101–111)
Creatinine, Ser: 1.1 mg/dL (ref 0.61–1.24)
GFR calc Af Amer: >60 mL/min (ref >60)
GFR calc non Af Amer: >60 mL/min (ref >60)
GLUCOSE: 111 mg/dL — AB (ref 65–99)
POTASSIUM: 4.3 mmol/L (ref 3.5–5.1)
Sodium: 140 mmol/L (ref 135–145)

## 2015-12-17 LAB — CBC
HEMATOCRIT: 27.4 % — AB (ref 39.0–52.0)
Hemoglobin: 8.7 g/dL — ABNORMAL LOW (ref 13.0–17.0)
MCH: 27.1 pg (ref 26.0–34.0)
MCHC: 31.8 g/dL (ref 30.0–36.0)
MCV: 85.4 fL (ref 78.0–100.0)
Platelets: 146 10*3/uL — ABNORMAL LOW (ref 150–400)
RBC: 3.21 MIL/uL — ABNORMAL LOW (ref 4.22–5.81)
RDW: 19.5 % — AB (ref 11.5–15.5)
WBC: 8.2 10*3/uL (ref 4.0–10.5)

## 2015-12-17 LAB — MAGNESIUM: MAGNESIUM: 2.1 mg/dL (ref 1.7–2.4)

## 2015-12-17 LAB — APTT: aPTT: 34 seconds (ref 24–36)

## 2015-12-17 LAB — PROTIME-INR
INR: 1.27
Prothrombin Time: 16 seconds — ABNORMAL HIGH (ref 11.4–15.2)

## 2015-12-17 LAB — BLOOD PRODUCT ORDER (VERBAL) VERIFICATION

## 2015-12-17 LAB — PHOSPHORUS: Phosphorus: 2.7 mg/dL (ref 2.5–4.6)

## 2015-12-17 MED ORDER — PSYLLIUM 95 % PO PACK
1.0000 | PACK | Freq: Two times a day (BID) | ORAL | Status: DC
  Filled 2015-12-17 (×4): qty 1

## 2015-12-17 MED ORDER — DOCUSATE SODIUM 100 MG PO CAPS
100.0000 mg | ORAL_CAPSULE | Freq: Two times a day (BID) | ORAL | Status: DC
  Administered 2015-12-17 – 2015-12-18 (×3): 100 mg via ORAL
  Filled 2015-12-17 (×4): qty 1

## 2015-12-17 MED ORDER — ZOLPIDEM TARTRATE 5 MG PO TABS
5.0000 mg | ORAL_TABLET | Freq: Once | ORAL | Status: AC
  Administered 2015-12-17: 5 mg via ORAL
  Filled 2015-12-17: qty 1

## 2015-12-17 NOTE — Progress Notes (Addendum)
Pt had bright red bloody stool this evening. Dr. Oletta Darter notified. Will continue to monitor.  Arnell Sieving, RN, BSN

## 2015-12-17 NOTE — Final Consult Note (Signed)
Assessment: Rectal bleeding secondary to prostate biopsy and anticoagulation - He has done well overnight.   He has voided without difficulty and also indicated he had a bowel movement.  Packing was left in the rectum and it sounds as if he may have passed this but he indicated there was no further bleeding.    Plan: Monitor over the next 24 hours and likely discharge in the morning.    Subjective: Patient reports having moved his bowels and urinating without difficulty.  Denies pain.  Objective: Vital signs in last 24 hours: Temp:  [97.2 F (36.2 C)-98.7 F (37.1 C)] 98.1 F (36.7 C) (11/15 0458) Pulse Rate:  [50-122] 68 (11/15 0700) Resp:  [9-25] 24 (11/15 0700) BP: (76-131)/(46-76) 122/63 (11/15 0700) SpO2:  [91 %-100 %] 100 % (11/15 0700) Weight:  [233 lb 11 oz (106 kg)] 233 lb 11 oz (106 kg) (11/14 1100)A  Intake/Output from previous day: 11/14 0701 - 11/15 0700 In: [id number] [I.V.:3170; Blood:1371; IV Piggyback:2100] Out: 675 [Urine:475; Blood:200] Intake/Output this shift: No intake/output data recorded.  Past Medical History:  Diagnosis Date  . Allergic rhinitis   . Anemia   . Anxiety   . Aortic stenosis 2012   mild to mod   . Atrial fibrillation ([place]) 1991   with multiple DCCV  . Bipolar affective disorder ([place])   . COPD (chronic obstructive pulmonary disease) ([place])   . Diabetic neuropathy ([place])    in feet  . Dyslipidemia   . Fatigue    last year or so  . Gout   . Hypertension   . Insomnia   . Obesity   . On home oxygen therapy 2 1/2 liters with bipap at night  . OSA (obstructive sleep apnea)    severe, uses bipap 16 1/2 by 12 or 13 setting  . Rectal bleeding 12/16/2015  . Type 2 diabetes mellitus ([place])   . Vertigo     Physical Exam:  Lungs - Normal respiratory effort, chest expands symmetrically.  Abdomen - Soft, non-tender & non-distended.  Lab Results:  Recent Labs  12/16/15 1116  12/16/15 1344 12/16/15 1705 12/17/15 0203  WBC 5.1  --   --   10.7* 8.2  HGB 9.5*  < > 10.5* 9.9* 8.7*  HCT 30.9*  < > 31.0* 32.0* 27.4*  < > = values in this interval not displayed. BMET  Recent Labs  12/16/15 1116 12/16/15 1123 12/16/15 1344 12/17/15 0203  NA 137 140 142 140  K 4.2 4.2 5.3* 4.3  CL 107 105  --  111  CO2 23  --   --  23  GLUCOSE 178* 170* 179* 111*  BUN 16 17  --  14  CREATININE 1.23 1.20  --  1.10  CALCIUM 9.0  --   --  8.1*   No results for input(s): LABURIN in the last 72 hours. Results for orders placed or performed during the hospital encounter of 12/16/15  MRSA PCR Screening     Status: None   Collection Time: 12/16/15  5:39 PM  Result Value Ref Range Status   MRSA by PCR NEGATIVE NEGATIVE Final    Comment:        The GeneXpert MRSA Assay (FDA approved for NASAL specimens only), is one component of a comprehensive MRSA colonization surveillance program. It is not intended to diagnose MRSA infection nor to guide or monitor treatment for MRSA infections.     Studies/Results: No results found.    Kodie Pick C 12/17/2015, 7:47 AM

## 2015-12-17 NOTE — Progress Notes (Addendum)
PULMONARY / CRITICAL CARE MEDICINE   Name: PETERSON BOGIE MRN: [id number] DOB: August 28, 1932    ADMISSION DATE:  12/16/2015 CONSULTATION DATE:  11/14  REFERRING MD:  EDP  CHIEF COMPLAINT:  GI bleed  Brief:   82 yo male , hx bipolar, osa, o2 dependent who had prostate bx last thursday. He is normally on coumadin but off for bx and recently restarted INR 1.28.  He developed rectal bleeding and presented to ER with profuse rectal bleed. Hgb was 9 but due to copious bleeding from rectum. He is stable post transfusion and awake and following commands. PCCm asked to assist in is care.  SUBJECTIVE:  No acute events overnight.   VITAL SIGNS: BP 122/63   Pulse 68   Temp 98.1 F (36.7 C) (Oral)   Resp (!) 24   Wt 106 kg (233 lb 11 oz)   SpO2 100%   BMI 30.83 kg/m   HEMODYNAMICS:    VENTILATOR SETTINGS:    INTAKE / OUTPUT: I/O last 3 completed shifts: In: [id number] [I.V.:3170; Blood:1371; IV [id number] Out: 675 [Urine:475; Blood:200]  PHYSICAL EXAMINATION: General: elderly man, resting in bed Neuro:  Alert and oriented x 4 HEENT:  No JVD LAN Cardiovascular:  No MRG, S1/S2, no JVD Lungs: 2L supplemental oxygen, clear/dimished breath sounds  Abdomen: active bowel sounds, non-tender Musculoskeletal:  No deformities  Skin: warm, dry, intact   LABS:  BMET  Recent Labs Lab 12/16/15 1116 12/16/15 1123 12/16/15 1344 12/17/15 0203  NA 137 140 142 140  K 4.2 4.2 5.3* 4.3  CL 107 105  --  111  CO2 23  --   --  23  BUN 16 17  --  14  CREATININE 1.23 1.20  --  1.10  GLUCOSE 178* 170* 179* 111*    Electrolytes  Recent Labs Lab 12/16/15 1116 12/17/15 0203  CALCIUM 9.0 8.1*    CBC  Recent Labs Lab 12/16/15 1116  12/16/15 1141 12/16/15 1344 12/16/15 1705 12/17/15 0203  WBC 5.1  --   --   --  10.7* 8.2  HGB 9.5*  < >  --  10.5* 9.9* 8.7*  HCT 30.9*  < >  --  31.0* 32.0* 27.4*  PLT 201  --  197  --  136* 146*  < > = values in this interval not  displayed.  Coag's  Recent Labs Lab 12/16/15 1141 12/16/15 1705 12/17/15 0203  APTT 37*  --  34  INR 1.61 1.27 1.27    Sepsis Markers No results for input(s): LATICACIDVEN, PROCALCITON, O2SATVEN in the last 168 hours.  ABG No results for input(s): PHART, PCO2ART, PO2ART in the last 168 hours.  Liver Enzymes  Recent Labs Lab 12/16/15 1116  AST 23  ALT 16*  ALKPHOS 72  BILITOT 0.5  ALBUMIN 3.5    Cardiac Enzymes No results for input(s): TROPONINI, PROBNP in the last 168 hours.  Glucose  Recent Labs Lab 12/16/15 1416 12/16/15 1603 12/16/15 2036 12/17/15 0057 12/17/15 0457  GLUCAP 169* 168* 141* 115* 119*    Imaging No results found.   STUDIES:    CULTURES:   ANTIBIOTICS:   SIGNIFICANT EVENTS: 11/9: Prostate biopsy  11/11: Restarted home coumadin  11/14: ED rectal bleeding balloon tamp placed > taken to OR for transanal inspection/rectal packing   LINES/TUBES:   DISCUSSION: 82 yo male , hx bipolar, osa, o2 dependent who had prostate bx last thursday. He is normally on coumadin but off for bx and recently restarted INR 1.28.  He developed rectal bleeding and presented to ER with profuse rectal bleed. Hgb was 9 but due to copious bleeding from rectum. He is stable post transfusion and awake and following commands. PCCm asked to assist in is care.   ASSESSMENT / PLAN:  PULMONARY A: COPD (O2 dependent) P:   Supplemental oxygen to maintain saturation >88 BIPAP @ HS  Continue Dulera   CARDIOVASCULAR A:  Hypotension from blood loss  CAF P:  Hold antihypertensives Monitor HR  RENAL A:   Multiple transfusion P:   Trend BMP Replace electrolytes as needed  strict I & O D/C NS   GASTROINTESTINAL A:   GIB with recent prostate bx s/p rectal packing in OR 11/14 P:   PPI CL Diet, advance as tolerated   HEMATOLOGIC A:   Blood loss anemia fib P:  Hold anticoagulants Transfusion as needed to keep hgb >8 with active bleed CBC in  am   INFECTIOUS A:   No apparent infection P:   Follow WBC and fever curve   ENDOCRINE A:   DM P:   SSI Metformin   NEUROLOGIC A:   Bipolar P:   RASS goal: 0 Hold all sedation   FAMILY  - Updates: Wife at bedside  - Inter-disciplinary family meet or Palliative Care meeting due by:  11/21  -APP CCT   Hayden Pedro, AG-ACNP [facility]  Pgr: [phone]  PCCM Pgr: [phone]  STAFF NOTE: Linwood Dibbles, MD FACP have personally reviewed patient's available data, including medical history, events of note, physical examination and test results as part of my evaluation. I have discussed with resident/NP and other care providers such as pharmacist, RN and RRT. In addition, I personally evaluated patient and elicited key findings of:  Awake, listening to banjo concert, no distress, no bleeding, HR wnl, BP wnl, no bleeding present, there is NO rush/need to restart anticoagulation for now, re assess in 48 hours, risk stroke on daily basis from fib is low, risk rebleeding higher, feed, cbc in am , to sdu, triad, home O2  Lavon Paganini. Titus Mould, MD, [place] Pgr: [facility] 12/17/2015 10:00 AM

## 2015-12-17 NOTE — Progress Notes (Signed)
[facility] Progress Note Patient Name: Joel [name] DOB: 1932/09/08 MRN: [id number]   Date of Service  12/17/2015  HPI/Events of Note  Patient requests sleeping aid.   eICU Interventions  Will order Ambien 5 mg PO X 1 now.      Intervention Category Intermediate Interventions: Other:  Lysle Dingwall 12/17/2015, 10:35 PM

## 2015-12-17 NOTE — Progress Notes (Signed)
1 Day Post-Op  Subjective: Stable and alert.  Denies pain or nausea Had a bowel movement and the packing was expelled.  Some old blood was seen No apparent active bleeding. Hemoglobin 8.7.  Was 9.9 yesterday afternoon INR 1.27.  Glucose 111. Objective: Vital signs in last 24 hours: Temp:  [97.2 F (36.2 C)-98.7 F (37.1 C)] 98.1 F (36.7 C) (11/15 0458) Pulse Rate:  [50-122] 64 (11/15 0600) Resp:  [9-25] 24 (11/15 0600) BP: (76-131)/(46-76) 131/56 (11/15 0600) SpO2:  [91 %-100 %] 100 % (11/15 0600) Weight:  [106 kg (233 lb 11 oz)] 106 kg (233 lb 11 oz) (11/14 1100) Last BM Date: 12/16/15  Intake/Output from previous day: 11/14 0701 - 11/15 0700 In: 6491 [I.V.:3020; Blood:1371; IV Piggyback:2100] Out: 675 [Urine:475; Blood:200] Intake/Output this shift: Total I/O In: 1650 [I.V.:1650] Out: 475 [Urine:475]  General appearance: Alert and pleasant Resp: clear to auscultation bilaterally GI: soft, non-tender; bowel sounds normal; no masses,  no organomegaly Male genitalia: normal, External rectal exam looks fine.  No ecchymoses.  No blood in bed.  Skin healthy.  Lab Results:  Results for orders placed or performed during the hospital encounter of 12/16/15 (from the past 24 hour(s))  Prepare fresh frozen plasma     Status: None   Collection Time: 12/16/15 11:03 AM  Result Value Ref Range   Unit Number [id number]    Blood Component Type LIQ PLASMA    Unit division 00    Status of Unit ISSUED,FINAL    Unit tag comment VERBAL ORDERS PER DR CARDEMA    Transfusion Status OK TO TRANSFUSE    Unit Number [id number]    Blood Component Type LIQ PLASMA    Unit division 00    Status of Unit ISSUED,FINAL    Unit tag comment VERBAL ORDERS PER DR CARDEMA    Transfusion Status OK TO TRANSFUSE    Unit Number [id number]    Blood Component Type LIQ PLASMA    Unit division 00    Status of Unit REL FROM [facility]    Transfusion Status OK TO TRANSFUSE    Unit Number [id number]     Blood Component Type LIQ PLASMA    Unit division 00    Status of Unit REL FROM [facility]    Transfusion Status OK TO TRANSFUSE    Unit Number [id number]    Blood Component Type LIQ PLASMA    Unit division 00    Status of Unit REL FROM [facility]    Transfusion Status OK TO TRANSFUSE    Unit Number [id number]    Blood Component Type LIQ PLASMA    Unit division 00    Status of Unit REL FROM [facility]    Transfusion Status OK TO TRANSFUSE   CBC with Differential     Status: Abnormal   Collection Time: 12/16/15 11:16 AM  Result Value Ref Range   WBC 5.1 4.0 - 10.5 K/uL   RBC 3.48 (L) 4.22 - 5.81 MIL/uL   Hemoglobin 9.5 (L) 13.0 - 17.0 g/dL   HCT 30.9 (L) 39.0 - 52.0 %   MCV 88.8 78.0 - 100.0 fL   MCH 27.3 26.0 - 34.0 pg   MCHC 30.7 30.0 - 36.0 g/dL   RDW 18.1 (H) 11.5 - 15.5 %   Platelets 201 150 - 400 K/uL   Neutrophils Relative % 59 %   Neutro Abs 3.0 1.7 - 7.7 K/uL   Lymphocytes Relative 29 %   Lymphs Abs 1.4 0.7 - 4.0 K/uL  Monocytes Relative 8 %   Monocytes Absolute 0.4 0.1 - 1.0 K/uL   Eosinophils Relative 4 %   Eosinophils Absolute 0.2 0.0 - 0.7 K/uL   Basophils Relative 1 %   Basophils Absolute 0.0 0.0 - 0.1 K/uL  Comprehensive metabolic panel     Status: Abnormal   Collection Time: 12/16/15 11:16 AM  Result Value Ref Range   Sodium 137 135 - 145 mmol/L   Potassium 4.2 3.5 - 5.1 mmol/L   Chloride 107 101 - 111 mmol/L   CO2 23 22 - 32 mmol/L   Glucose, Bld 178 (H) 65 - 99 mg/dL   BUN 16 6 - 20 mg/dL   Creatinine, Ser 1.23 0.61 - 1.24 mg/dL   Calcium 9.0 8.9 - 10.3 mg/dL   Total Protein 6.2 (L) 6.5 - 8.1 g/dL   Albumin 3.5 3.5 - 5.0 g/dL   AST 23 15 - 41 U/L   ALT 16 (L) 17 - 63 U/L   Alkaline Phosphatase 72 38 - 126 U/L   Total Bilirubin 0.5 0.3 - 1.2 mg/dL   GFR calc non Af Amer 53 (L) >60 mL/min   GFR calc Af Amer >60 >60 mL/min   Anion gap 7 5 - 15  Protime-INR     Status: Abnormal   Collection Time: 12/16/15 11:16 AM  Result Value Ref Range   Prothrombin  Time 19.8 (H) 11.4 - 15.2 seconds   INR 1.66   Type and screen     Status: None (Preliminary result)   Collection Time: 12/16/15 11:16 AM  Result Value Ref Range   ABO/RH(D) O POS    Antibody Screen NEG    Sample Expiration 12/19/2015    Unit Number [id number]    Blood Component Type RED CELLS,LR    Unit division 00    Status of Unit ISSUED,FINAL    Unit tag comment VERBAL ORDERS PER DR CARDEMA    Transfusion Status OK TO TRANSFUSE    Crossmatch Result COMPATIBLE    Unit Number [id number]    Blood Component Type RED CELLS,LR    Unit division 00    Status of Unit ISSUED,FINAL    Unit tag comment VERBAL ORDERS PER DR CARDEMA    Transfusion Status OK TO TRANSFUSE    Crossmatch Result COMPATIBLE    Unit Number [id number]    Blood Component Type RBC LR PHER2    Unit division 00    Status of Unit ALLOCATED    Unit tag comment VERBAL ORDERS PER DR [facility]    Transfusion Status OK TO TRANSFUSE    Crossmatch Result COMPATIBLE    Unit Number [id number]    Blood Component Type RED CELLS,LR    Unit division 00    Status of Unit ISSUED,FINAL    Unit tag comment VERBAL ORDERS PER DR [facility]    Transfusion Status OK TO TRANSFUSE    Crossmatch Result COMPATIBLE    Unit Number [id number]    Blood Component Type RBC LR PHER1    Unit division 00    Status of Unit REL FROM [facility]    Unit tag comment VERBAL ORDERS PER DR CARDANA    Transfusion Status OK TO TRANSFUSE    Crossmatch Result NOT NEEDED    Unit Number [id number]    Blood Component Type RBC LR PHER2    Unit division 00    Status of Unit REL FROM [facility]    Unit tag comment VERBAL ORDERS PER DR [facility]    Transfusion  Status OK TO TRANSFUSE    Crossmatch Result NOT NEEDED    Unit Number [id number]    Blood Component Type RBC LR PHER1    Unit division 00    Status of Unit REL FROM [facility]    Transfusion Status OK TO TRANSFUSE    Crossmatch Result Compatible    Unit Number [id number]    Blood Component  Type RED CELLS,LR    Unit division 00    Status of Unit REL FROM [facility]    Transfusion Status OK TO TRANSFUSE    Crossmatch Result Compatible    Unit Number [id number]    Blood Component Type RED CELLS,LR    Unit division 00    Status of Unit ALLOCATED    Transfusion Status OK TO TRANSFUSE    Crossmatch Result Compatible    Unit Number [id number]    Blood Component Type RED CELLS,LR    Unit division 00    Status of Unit ALLOCATED    Transfusion Status OK TO TRANSFUSE    Crossmatch Result Compatible    Unit Number [id number]    Blood Component Type RED CELLS,LR    Unit division 00    Status of Unit REL FROM [facility]    Transfusion Status OK TO TRANSFUSE    Crossmatch Result Compatible    Unit Number [id number]    Blood Component Type RED CELLS,LR    Unit division 00    Status of Unit REL FROM [facility]    Transfusion Status OK TO TRANSFUSE    Crossmatch Result Compatible    Unit Number [id number]    Blood Component Type RED CELLS,LR    Unit division 00    Status of Unit ALLOCATED    Transfusion Status OK TO TRANSFUSE    Crossmatch Result Compatible    Unit Number [id number]    Blood Component Type RED CELLS,LR    Unit division 00    Status of Unit ALLOCATED    Transfusion Status OK TO TRANSFUSE    Crossmatch Result Compatible   ABO/Rh     Status: None   Collection Time: 12/16/15 11:16 AM  Result Value Ref Range   ABO/RH(D) O POS   I-stat chem 8, ed     Status: Abnormal   Collection Time: 12/16/15 11:23 AM  Result Value Ref Range   Sodium 140 135 - 145 mmol/L   Potassium 4.2 3.5 - 5.1 mmol/L   Chloride 105 101 - 111 mmol/L   BUN 17 6 - 20 mg/dL   Creatinine, Ser 1.20 0.61 - 1.24 mg/dL   Glucose, Bld 170 (H) 65 - 99 mg/dL   Calcium, Ion 1.13 (L) 1.15 - 1.40 mmol/L   TCO2 23 0 - 100 mmol/L   Hemoglobin 9.9 (L) 13.0 - 17.0 g/dL   HCT 29.0 (L) 39.0 - 52.0 %  DIC (disseminated intravasc coag) panel (STAT)     Status: Abnormal   Collection Time: 12/16/15  11:41 AM  Result Value Ref Range   Prothrombin Time 19.4 (H) 11.4 - 15.2 seconds   INR 1.61    aPTT 37 (H) 24 - 36 seconds   Fibrinogen 333 210 - 475 mg/dL   D-Dimer, Quant 0.61 (H) 0.00 - 0.50 ug/mL-FEU   Platelets 197 150 - 400 K/uL   Smear Review Rare schistocytes   Initiate MTP (Blood Bank Notification)     Status: None   Collection Time: 12/16/15 11:45 AM  Result Value Ref Range   Initiate Massive Transfusion Protocol MTP  ORDER RECEIVED   Prepare RBC     Status: None   Collection Time: 12/16/15 11:53 AM  Result Value Ref Range   Order Confirmation ORDER PROCESSED BY BLOOD BANK   Prepare fresh frozen plasma     Status: None   Collection Time: 12/16/15 11:53 AM  Result Value Ref Range   Unit Number [id number]    Blood Component Type THAWED PLASMA    Unit division 00    Status of Unit REL FROM [facility]    Transfusion Status OK TO TRANSFUSE    Unit Number [id number]    Blood Component Type THAWED PLASMA    Unit division 00    Status of Unit REL FROM [facility]    Transfusion Status OK TO TRANSFUSE    Unit Number [id number]    Blood Component Type THAWED PLASMA    Unit division 00    Status of Unit REL FROM [facility]    Transfusion Status OK TO TRANSFUSE    Unit Number [id number]    Blood Component Type THWPLS APHR1    Unit division 00    Status of Unit REL FROM [facility]    Transfusion Status OK TO TRANSFUSE    Unit Number [id number]    Blood Component Type THWPLS APHR1    Unit division 00    Status of Unit REL FROM [facility]    Transfusion Status OK TO TRANSFUSE    Unit Number [id number]    Blood Component Type THWPLS APHR2    Unit division 00    Status of Unit REL FROM [facility]    Transfusion Status OK TO TRANSFUSE    Unit Number [id number]    Blood Component Type THWPLS APHR2    Unit division 00    Status of Unit REL FROM [facility]    Transfusion Status OK TO TRANSFUSE    Unit Number [id number]    Blood Component Type THWPLS APHR2    Unit division 00     Status of Unit REL FROM [facility]    Transfusion Status OK TO TRANSFUSE   I-STAT 4, (NA,K, GLUC, HGB,HCT)     Status: Abnormal   Collection Time: 12/16/15  1:44 PM  Result Value Ref Range   Sodium 142 135 - 145 mmol/L   Potassium 5.3 (H) 3.5 - 5.1 mmol/L   Glucose, Bld 179 (H) 65 - 99 mg/dL   HCT 31.0 (L) 39.0 - 52.0 %   Hemoglobin 10.5 (L) 13.0 - 17.0 g/dL  Glucose, capillary     Status: Abnormal   Collection Time: 12/16/15  2:16 PM  Result Value Ref Range   Glucose-Capillary 169 (H) 65 - 99 mg/dL  Glucose, capillary     Status: Abnormal   Collection Time: 12/16/15  4:03 PM  Result Value Ref Range   Glucose-Capillary 168 (H) 65 - 99 mg/dL   Comment 1 Notify RN   CBC     Status: Abnormal   Collection Time: 12/16/15  5:05 PM  Result Value Ref Range   WBC 10.7 (H) 4.0 - 10.5 K/uL   RBC 3.70 (L) 4.22 - 5.81 MIL/uL   Hemoglobin 9.9 (L) 13.0 - 17.0 g/dL   HCT 32.0 (L) 39.0 - 52.0 %   MCV 86.5 78.0 - 100.0 fL   MCH 26.8 26.0 - 34.0 pg   MCHC 30.9 30.0 - 36.0 g/dL   RDW 19.1 (H) 11.5 - 15.5 %   Platelets 136 (L) 150 - 400 K/uL  Protime-INR     Status: Abnormal  Collection Time: 12/16/15  5:05 PM  Result Value Ref Range   Prothrombin Time 16.0 (H) 11.4 - 15.2 seconds   INR 1.27   MRSA PCR Screening     Status: None   Collection Time: 12/16/15  5:39 PM  Result Value Ref Range   MRSA by PCR NEGATIVE NEGATIVE  Glucose, capillary     Status: Abnormal   Collection Time: 12/16/15  8:36 PM  Result Value Ref Range   Glucose-Capillary 141 (H) 65 - 99 mg/dL   Comment 1 Notify RN   Glucose, capillary     Status: Abnormal   Collection Time: 12/17/15 12:57 AM  Result Value Ref Range   Glucose-Capillary 115 (H) 65 - 99 mg/dL   Comment 1 Notify RN   CBC     Status: Abnormal   Collection Time: 12/17/15  2:03 AM  Result Value Ref Range   WBC 8.2 4.0 - 10.5 K/uL   RBC 3.21 (L) 4.22 - 5.81 MIL/uL   Hemoglobin 8.7 (L) 13.0 - 17.0 g/dL   HCT 27.4 (L) 39.0 - 52.0 %   MCV 85.4 78.0 - 100.0 fL    MCH 27.1 26.0 - 34.0 pg   MCHC 31.8 30.0 - 36.0 g/dL   RDW 19.5 (H) 11.5 - 15.5 %   Platelets 146 (L) 150 - 400 K/uL  Basic metabolic panel     Status: Abnormal   Collection Time: 12/17/15  2:03 AM  Result Value Ref Range   Sodium 140 135 - 145 mmol/L   Potassium 4.3 3.5 - 5.1 mmol/L   Chloride 111 101 - 111 mmol/L   CO2 23 22 - 32 mmol/L   Glucose, Bld 111 (H) 65 - 99 mg/dL   BUN 14 6 - 20 mg/dL   Creatinine, Ser 1.10 0.61 - 1.24 mg/dL   Calcium 8.1 (L) 8.9 - 10.3 mg/dL   GFR calc non Af Amer >60 >60 mL/min   GFR calc Af Amer >60 >60 mL/min   Anion gap 6 5 - 15  APTT     Status: None   Collection Time: 12/17/15  2:03 AM  Result Value Ref Range   aPTT 34 24 - 36 seconds  Protime-INR     Status: Abnormal   Collection Time: 12/17/15  2:03 AM  Result Value Ref Range   Prothrombin Time 16.0 (H) 11.4 - 15.2 seconds   INR 1.27   Glucose, capillary     Status: Abnormal   Collection Time: 12/17/15  4:57 AM  Result Value Ref Range   Glucose-Capillary 119 (H) 65 - 99 mg/dL   Comment 1 Notify RN   Provider-confirm verbal Blood Bank order - RBC, FFP, Type & Screen; 2 Units; Order taken: 12/16/2015; 11:06 AM; Emergency Release, STAT, Patient actively bleeding 2 units of O negative red cells and 2 units of A plasmas emergency released to the E...     Status: None   Collection Time: 12/17/15  7:30 AM  Result Value Ref Range   Blood product order confirm MD AUTHORIZATION REQUESTED      Studies/Results: No results found.  . diltiazem  120 mg Oral QHS  . docusate sodium  100 mg Oral BID  . famotidine (PEPCID) IV  20 mg Intravenous Q12H  . insulin aspart  0-20 Units Subcutaneous Q4H  . irbesartan  37.5 mg Oral Daily  . iron polysaccharides  150 mg Oral BID  . linagliptin  5 mg Oral Daily  . metFORMIN  1,000 mg Oral BID  WC  . mometasone-formoterol  2 puff Inhalation BID  . potassium chloride SA  20 mEq Oral Daily  . psyllium  1 packet Oral BID  . torsemide  40 mg Oral Daily      Assessment/Plan: s/p Procedure(s): PROCTOSCOPY WITH CONTROL OF BLEEDING  Rectal bleeding secondary to prostate biopsy and Coumadin anticoagulation,  complicated by transient hemorrhagic shock. Bleeding appears to have stopped Advance diet Transfer stepdown unit Close observation [place] when we are sure that the bleeding has stopped  Prostate cancer.  Patient not aware of his diagnosis she had.  Discussed with Dr. Karsten Ro Severe left-sided diverticulosis on colonoscopy 3 years ago.  This does not appear to be the source of bleeding Atrial fibrillation, status post Cox-Maze procedure a cardioversion CHF Hypertension COPD Aortic stenosis Diabetes Bipolar by history   @PROBHOSP @  LOS: 1 day    Joel [name] M 12/17/2015  . .prob

## 2015-12-18 DIAGNOSIS — F319 Bipolar disorder, unspecified: Secondary | ICD-10-CM

## 2015-12-18 DIAGNOSIS — J431 Panlobular emphysema: Secondary | ICD-10-CM

## 2015-12-18 DIAGNOSIS — K625 Hemorrhage of anus and rectum: Secondary | ICD-10-CM | POA: Diagnosis not present

## 2015-12-18 DIAGNOSIS — D62 Acute posthemorrhagic anemia: Secondary | ICD-10-CM

## 2015-12-18 DIAGNOSIS — IMO0002 Reserved for concepts with insufficient information to code with codable children: Secondary | ICD-10-CM

## 2015-12-18 DIAGNOSIS — E118 Type 2 diabetes mellitus with unspecified complications: Secondary | ICD-10-CM

## 2015-12-18 DIAGNOSIS — K922 Gastrointestinal hemorrhage, unspecified: Secondary | ICD-10-CM

## 2015-12-18 DIAGNOSIS — I480 Paroxysmal atrial fibrillation: Secondary | ICD-10-CM

## 2015-12-18 DIAGNOSIS — E1165 Type 2 diabetes mellitus with hyperglycemia: Secondary | ICD-10-CM

## 2015-12-18 DIAGNOSIS — I9589 Other hypotension: Secondary | ICD-10-CM | POA: Diagnosis not present

## 2015-12-18 DIAGNOSIS — I959 Hypotension, unspecified: Secondary | ICD-10-CM

## 2015-12-18 LAB — CBC
HCT: 26.6 % — ABNORMAL LOW (ref 39.0–52.0)
Hemoglobin: 8.3 g/dL — ABNORMAL LOW (ref 13.0–17.0)
MCH: 27 pg (ref 26.0–34.0)
MCHC: 31.2 g/dL (ref 30.0–36.0)
MCV: 86.6 fL (ref 78.0–100.0)
PLATELETS: 122 10*3/uL — AB (ref 150–400)
RBC: 3.07 MIL/uL — ABNORMAL LOW (ref 4.22–5.81)
RDW: 19.5 % — AB (ref 11.5–15.5)
WBC: 4.6 10*3/uL (ref 4.0–10.5)

## 2015-12-18 LAB — PROTIME-INR
INR: 1.09
Prothrombin Time: 14.2 seconds (ref 11.4–15.2)

## 2015-12-18 LAB — BASIC METABOLIC PANEL
ANION GAP: 7 (ref 5–15)
BUN: 13 mg/dL (ref 6–20)
CALCIUM: 8.2 mg/dL — AB (ref 8.9–10.3)
CO2: 24 mmol/L (ref 22–32)
Chloride: 109 mmol/L (ref 101–111)
Creatinine, Ser: 1.28 mg/dL — ABNORMAL HIGH (ref 0.61–1.24)
GFR, EST AFRICAN AMERICAN: 59 mL/min — AB (ref >60)
GFR, EST NON AFRICAN AMERICAN: 51 mL/min — AB (ref >60)
Glucose, Bld: 133 mg/dL — ABNORMAL HIGH (ref 65–99)
Potassium: 3.8 mmol/L (ref 3.5–5.1)
Sodium: 140 mmol/L (ref 135–145)

## 2015-12-18 LAB — GLUCOSE, CAPILLARY
GLUCOSE-CAPILLARY: 130 mg/dL — AB (ref 65–99)
Glucose-Capillary: 122 mg/dL — ABNORMAL HIGH (ref 65–99)
Glucose-Capillary: 202 mg/dL — ABNORMAL HIGH (ref 65–99)

## 2015-12-18 MED ORDER — BUPROPION HCL ER (XL) 150 MG PO TB24: 150.0000 mg | ORAL_TABLET | Freq: Every day | ORAL | 1 refills | Status: DC

## 2015-12-18 MED ORDER — BUPROPION HCL ER (XL) 150 MG PO TB24
150.0000 mg | ORAL_TABLET | Freq: Every day | ORAL | Status: DC
  Filled 2015-12-18: qty 1

## 2015-12-18 NOTE — Progress Notes (Signed)
Medical Consultation   QUINTAN PICARDO  [id number]  DOB: [birth year]  DOA: 12/16/2015  PCP: Wenda Low, MD    Requesting physician: [facility]  Reason for consultation: Multiple medical problems.   History of Present Illness:  Joel [name] is an 82yo WM PMHx Anxiety,Bipolar affective disorder , Atrial fibrillation S/P multiple DCCV, HTN, OSA on BiPAP, COPD on Home O2 Diabetes type 2 uncontrolled with comfort occasion,  male who was brought to the ED via EMS earlier today with active bleeding from his rectum. States that he had a prostate biopsy performed 12/11/15 by Dr. Karsten Ro. Did well following the procedure until this morning when he noticed bleeding per rectum in the shower. He called EMS because the bleeding wound not stop. Reports having a BM yesterday that was nonbloody. Denies n/v or abdominal pain. Last meal was last night  11/16 A/O 4, NAD. Patient anxious about being discharged secondary to living alone and fearful that his rectal bleeding will reoccur. States prior to this hospitalization was just barely able to summon help, felt presyncopal. In addition does not ambulate well secondary to bilateral knee OA (requires cane or walker). NOTE; was informed by nursing staff that patient had stopped his psychiatric medication secondary tosexual dysfunction (has new girlfriend).     Review of Systems:  Review of Systems  Constitutional: Negative for chills, diaphoresis, fever, malaise/fatigue and weight loss.  HENT: Negative.   Eyes: Negative.   Respiratory: Negative.   Cardiovascular: Negative.   Gastrointestinal: Negative.   Genitourinary: Negative.   Musculoskeletal: Positive for joint pain. Negative for back pain, myalgias and neck pain.  Skin: Negative.   Neurological: Positive for weakness.  Endo/Heme/Allergies: Negative.   Psychiatric/Behavioral: Positive for depression. Negative for hallucinations, memory loss, substance  abuse and suicidal ideas. The patient is not nervous/anxious and does not have insomnia.      Past Medical History: Past Medical History:  Diagnosis Date  . Allergic rhinitis   . Anemia   . Anxiety   . Aortic stenosis 2012   mild to mod   . Atrial fibrillation ([place]) 1991   with multiple DCCV  . Bipolar affective disorder ([place])   . COPD (chronic obstructive pulmonary disease) ([place])   . Diabetic neuropathy ([place])    in feet  . Dyslipidemia   . Fatigue    last year or so  . Gout   . Hypertension   . Insomnia   . Obesity   . On home oxygen therapy 2 1/2 liters with bipap at night  . OSA (obstructive sleep apnea)    severe, uses bipap 16 1/2 by 12 or 13 setting  . Rectal bleeding 12/16/2015  . Type 2 diabetes mellitus ([place])   . Vertigo     Past Surgical History: Past Surgical History:  Procedure Laterality Date  . CARDIOVERSION  yrs ago  . COLONOSCOPY WITH PROPOFOL N/A 01/23/2013   Procedure: COLONOSCOPY WITH PROPOFOL;  Surgeon: Garlan Fair, MD;  Location: [facility];  Service: Endoscopy;  Laterality: N/A;  . electro shock  1969   for depression  . ESOPHAGOGASTRODUODENOSCOPY (EGD) WITH PROPOFOL N/A 01/23/2013   Procedure: ESOPHAGOGASTRODUODENOSCOPY (EGD) WITH PROPOFOL;  Surgeon: Garlan Fair, MD;  Location: [facility];  Service: Endoscopy;  Laterality: N/A;  . HEMORRHOID SURGERY N/A 12/16/2015   Procedure: PROCTOSCOPY WITH CONTROL OF BLEEDING;  Surgeon: Fanny Skates, MD;  Location: [facility];  Service: General;  Laterality: N/A;  . KNEE SURGERY  1970's   rt  . MAZE  1998  . PROSTATE BIOPSY    . SHOULDER SURGERY  7-8 yrs ago   rt     Allergies:   Allergies  Allergen Reactions  . Penicillins Anaphylaxis  . Antihistamines, Loratadine-Type     depression  . Meclizine     depression  . Other     Beta blockers-Novacaine depression     Social History:  reports that he quit smoking about 39 years ago. His smoking use included Cigarettes. He has a  50.00 pack-year smoking history. He has never used smokeless tobacco. He reports that he does not drink alcohol or use drugs.   Family History: Family History  Problem Relation Age of Onset  . Tuberculosis Maternal Grandmother   . Heart attack Neg Hx   . Diabetes Neg Hx   . CAD Neg Hx   . Cancer Neg Hx    Consult Dr.Haywood Dalbert [facility] Dr.Robert Buccini GI Dr.Daniel Lily Kocher, PC CM    Procedures/Significant Events:  11/14 proctoscopy with insertion of rectal packing:Rectal bleeding, S/P recent prostate biopsy, hypovolemic shock  Cultures None  Antimicrobials: None   Devices None   LINES / TUBES:  None    Continuous Infusions:   Physical Exam: Vitals:   12/18/15 0400 12/18/15 0500 12/18/15 0700 12/18/15 0754  BP: 130/87 (!) 128/56 (!) 113/47   Pulse: 67 68 65   Resp: 16 (!) 25 19   Temp:    97.7 F (36.5 C)  TempSrc:    Oral  SpO2: 100% 91% 95%   Weight:        General: A/O 4, NAD, No acute respiratory distress Eyes: negative scleral hemorrhage, negative anisocoria, negative icterus ENT: Negative Runny nose, negative gingival bleeding, Neck:  Negative scars, masses, torticollis, lymphadenopathy, JVD Lungs: Clear to auscultation bilaterally without wheezes or crackles Cardiovascular: Regular rate and rhythm without murmur gallop or rub normal S1 and S2 Abdomen: negative abdominal pain, nondistended, positive soft, bowel sounds, no rebound, no ascites, no appreciable mass Extremities: No significant cyanosis, clubbing, or edema bilateral lower extremities Skin: Negative rashes, lesions, ulcers Psychiatric:  Negative depression, negative anxiety, negative fatigue, negative mania  Central nervous system:  Cranial nerves II through XII intact, tongue/uvula midline, all extremities muscle strength 5/5, sensation intact throughout, negative dysarthria, negative expressive aphasia, negative receptive aphasia.  Data  reviewed:  I have personally reviewed following labs and imaging studies Labs:  CBC:  Recent Labs Lab 12/16/15 1116 12/16/15 1123 12/16/15 1141 12/16/15 1344 12/16/15 1705 12/17/15 0203 12/18/15 0213  WBC 5.1  --   --   --  10.7* 8.2 4.6  NEUTROABS 3.0  --   --   --   --   --   --   HGB 9.5* 9.9*  --  10.5* 9.9* 8.7* 8.3*  HCT 30.9* 29.0*  --  31.0* 32.0* 27.4* 26.6*  MCV 88.8  --   --   --  86.5 85.4 86.6  PLT 201  --  197  --  136* 146* 122*    Basic Metabolic Panel:  Recent Labs Lab 12/16/15 1116 12/16/15 1123 12/16/15 1344 12/17/15 0203 12/18/15 0213  NA 137 140 142 140 140  K 4.2 4.2 5.3* 4.3 3.8  CL 107 105  --  111 109  CO2 23  --   --  23 24  GLUCOSE 178* 170* 179* 111* 133*  BUN 16 17  --  14 13  CREATININE 1.23 1.20  --  1.10 1.28*  CALCIUM 9.0  --   --  8.1* 8.2*  MG  --   --   --  2.1  --   PHOS  --   --   --  2.7  --    GFR Estimated Creatinine Clearance: 57.8 mL/min (by C-G formula based on SCr of 1.28 mg/dL (H)). Liver Function Tests:  Recent Labs Lab 12/16/15 1116  AST 23  ALT 16*  ALKPHOS 72  BILITOT 0.5  PROT 6.2*  ALBUMIN 3.5   No results for input(s): LIPASE, AMYLASE in the last 168 hours. No results for input(s): AMMONIA in the last 168 hours. Coagulation profile  Recent Labs Lab 12/16/15 1116 12/16/15 1141 12/16/15 1705 12/17/15 0203 12/18/15 0213  INR 1.66 1.61 1.27 1.27 1.09    Cardiac Enzymes: No results for input(s): CKTOTAL, CKMB, CKMBINDEX, TROPONINI in the last 168 hours. BNP: Invalid input(s): POCBNP CBG:  Recent Labs Lab 12/17/15 1510 12/17/15 1956 12/17/15 2319 12/18/15 0318 12/18/15 0747  GLUCAP 132* 157* 165* 130* 122*   D-Dimer  Recent Labs  12/16/15 1141  DDIMER 0.61*   Hgb A1c No results for input(s): HGBA1C in the last 72 hours. Lipid Profile No results for input(s): CHOL, HDL, LDLCALC, TRIG, CHOLHDL, LDLDIRECT in the last 72 hours. Thyroid function studies No results for input(s):  TSH, T4TOTAL, T3FREE, THYROIDAB in the last 72 hours.  Invalid input(s): FREET3 Anemia work up No results for input(s): VITAMINB12, FOLATE, FERRITIN, TIBC, IRON, RETICCTPCT in the last 72 hours. Urinalysis    Component Value Date/Time   COLORURINE YELLOW 10/22/2014 2136   APPEARANCEUR CLEAR 10/22/2014 2136   LABSPEC 1.023 10/22/2014 2136   PHURINE 5.5 10/22/2014 2136   GLUCOSEU NEGATIVE 10/22/2014 2136   HGBUR NEGATIVE 10/22/2014 2136   BILIRUBINUR NEGATIVE 10/22/2014 2136   KETONESUR NEGATIVE 10/22/2014 2136   PROTEINUR NEGATIVE 10/22/2014 2136   UROBILINOGEN 0.2 10/22/2014 2136   NITRITE NEGATIVE 10/22/2014 2136   LEUKOCYTESUR NEGATIVE 10/22/2014 2136     Microbiology Recent Results (from the past 240 hour(s))  MRSA PCR Screening     Status: None   Collection Time: 12/16/15  5:39 PM  Result Value Ref Range Status   MRSA by PCR NEGATIVE NEGATIVE Final    Comment:        The GeneXpert MRSA Assay (FDA approved for NASAL specimens only), is one component of a comprehensive MRSA colonization surveillance program. It is not intended to diagnose MRSA infection nor to guide or monitor treatment for MRSA infections.        Inpatient Medications:   Scheduled Meds: . diltiazem  120 mg Oral QHS  . docusate sodium  100 mg Oral BID  . famotidine (PEPCID) IV  20 mg Intravenous Q12H  . insulin aspart  0-20 Units Subcutaneous Q4H  . irbesartan  37.5 mg Oral Daily  . iron polysaccharides  150 mg Oral BID  . linagliptin  5 mg Oral Daily  . metFORMIN  1,000 mg Oral BID WC  . mometasone-formoterol  2 puff Inhalation BID  . potassium chloride SA  20 mEq Oral Daily  . psyllium  1 packet Oral BID  . torsemide  40 mg Oral Daily   Continuous Infusions:   Radiological Exams on Admission: No results found.  Impression/Recommendations Principal Problem:   Rectal bleeding Active Problems:   Bright red rectal bleeding  COPD (O2 dependent) -Titrate O2 to maintain SPO2 > 89%   -BIPAP @  HS  -Continue Dulera   Hypotension -Most likely secondary to acute blood loss anemia. -Resolved. .  Atrial fibrillation -Patient currently rate controlled on diltiazem 120 mg daily. -Currently in NSR,  Lower GI bleed/Acute Blood Loss anemia -Patient with recent history of prostate biopsy resulting in rectal bleeding. -S/P Rectal packing in OR 11/14 Recent Labs Lab 12/16/15 1123 12/16/15 1344 12/16/15 1705 12/17/15 0203 12/18/15 0213  HGB 9.9* 10.5* 9.9* 8.7* 8.3*  -11/14 transfused 3 units PRBC -11/14 transfused 2 units FFP -Coumadin on hold for at least 7 days. -Transfuse for hemoglobin<8  DM Type 2 uncontrolled with complications -[id number]  Hemoglobin A1c =8.3 - Tradjenta 5 mg daily -Metformin 1000 mg BID -Patient not controlled on 2 oral agents per ADA guidelines should be started on insulin. Appears surgery to discharge patient today recommend patient follow-up with PCP start Lantus 5 units daily  Bipolar 1 disorder/Depression -By patient's MAR does not appear to been on medication at admission: Per nursing staff stop medication secondary to sexual dysfunction (has new girlfriend) -11/16 consulted Psychiatry for medication adjustment.  Goals of care -PT/OT pending   Thank you for this consultation.  Our [facility] hospitalist team will follow the patient with you.   Time Spent: 35 minutes  Deep [place], Geraldo Docker M.D. [facility] 12/18/2015, 7:59 AM

## 2015-12-18 NOTE — Care Management Note (Signed)
Case Management Note  Patient Details  Name: EMERALD CUNNANE MRN: [id number] Date of Birth: July 08, 1932  Subjective/Objective:   Pt admitted with rectal bleeding            Action/Plan:  PTA from home independent alone.  Pt refused PT evaluation as ordered - stated "I don't want to go on an excursion here but I can walk around just fine in my house"  - son and daughter in law at bedside during assessment.  Attending has determine pt is at baseline and is safe to discharge home without  [facility] or DME.  Pt stated he already has wheelchair, walkers and cane in the home.  Pt will stay with significant other during the day and feels comfortable that he can take care of himself evening/night.  CM informed pt that if he determines that  HHPT or RN in the home would be beneficial he can ask his PCP for orders - pt has already hospital discharge follow up scheduled.  No CM needs determined prior to discharge   Expected Discharge Date:                  Expected Discharge Plan:  Home/Self Care  In-House Referral:     Discharge planning Services  CM Consult  Post Acute Care Choice:    Choice offered to:     DME Arranged:    DME Agency:     HH Arranged:    HH Agency:     Status of Service:  In process, will continue to follow  If discussed at Long Length of Stay Meetings, dates discussed:    Additional Comments:  Maryclare Labrador, RN 12/18/2015, 1:57 PM

## 2015-12-18 NOTE — Progress Notes (Signed)
2 Days Post-Op  Subjective: Awake and alert No pain Ambulated to bathroom.  He has a little bit of chronic unsteadiness at home.  He thinks he is back to baseline Tolerated regular diet No bowel movements overnight.  Bleeding seems to have stopped Hemoglobin 8.724 hours ago.  Hemoglobin 8.3 now.  Creatinine 1.28.  Potassium 3.8.  Glucose 133.  Objective: Vital signs in last 24 hours: Temp:  [97.5 F (36.4 C)-98.6 F (37 C)] 98.3 F (36.8 C) (11/16 0316) Pulse Rate:  [29-91] 67 (11/16 0400) Resp:  [11-27] 16 (11/16 0400) BP: (104-135)/(50-98) 130/87 (11/16 0400) SpO2:  [93 %-100 %] 100 % (11/16 0400) Last BM Date: 12/17/15  Intake/Output from previous day: 11/15 0701 - 11/16 0700 In: 580 [P.O.:480; IV Piggyback:100] Out: 901 [Urine:900; Stool:1] Intake/Output this shift: Total I/O In: 50 [IV Piggyback:50] Out: -   General appearance: Alert.  Cooperative.  No distress. GI: soft, non-tender; bowel sounds normal; no masses,  no organomegaly Male genitalia: normal, External anorectal exam reveals no blood or cellulitis no ecchymoses or hematoma  Lab Results:  Results for orders placed or performed during the hospital encounter of 12/16/15 (from the past 24 hour(s))  Provider-confirm verbal Blood Bank order - RBC, FFP, Type & Screen; 2 Units; Order taken: 12/16/2015; 11:06 AM; Emergency Release, STAT, Patient actively bleeding 2 units of O negative red cells and 2 units of A plasmas emergency released to the E...     Status: None   Collection Time: 12/17/15  7:30 AM  Result Value Ref Range   Blood product order confirm MD AUTHORIZATION REQUESTED   Glucose, capillary     Status: Abnormal   Collection Time: 12/17/15  8:17 AM  Result Value Ref Range   Glucose-Capillary 159 (H) 65 - 99 mg/dL   Comment 1 Notify RN    Comment 2 Document in Chart   BLOOD TRANSFUSION REPORT - SCANNED     Status: None   Collection Time: 12/17/15 10:41 AM   Narrative   Ordered by an unspecified  provider.  Glucose, capillary     Status: Abnormal   Collection Time: 12/17/15 11:45 AM  Result Value Ref Range   Glucose-Capillary 187 (H) 65 - 99 mg/dL   Comment 1 Notify RN    Comment 2 Document in Chart   Glucose, capillary     Status: Abnormal   Collection Time: 12/17/15  3:10 PM  Result Value Ref Range   Glucose-Capillary 132 (H) 65 - 99 mg/dL   Comment 1 Document in Chart   Glucose, capillary     Status: Abnormal   Collection Time: 12/17/15  7:56 PM  Result Value Ref Range   Glucose-Capillary 157 (H) 65 - 99 mg/dL   Comment 1 Notify RN    Comment 2 Document in Chart   Glucose, capillary     Status: Abnormal   Collection Time: 12/17/15 11:19 PM  Result Value Ref Range   Glucose-Capillary 165 (H) 65 - 99 mg/dL   Comment 1 Notify RN    Comment 2 Document in Chart   CBC     Status: Abnormal   Collection Time: 12/18/15  2:13 AM  Result Value Ref Range   WBC 4.6 4.0 - 10.5 K/uL   RBC 3.07 (L) 4.22 - 5.81 MIL/uL   Hemoglobin 8.3 (L) 13.0 - 17.0 g/dL   HCT 26.6 (L) 39.0 - 52.0 %   MCV 86.6 78.0 - 100.0 fL   MCH 27.0 26.0 - 34.0 pg  MCHC 31.2 30.0 - 36.0 g/dL   RDW 19.5 (H) 11.5 - 15.5 %   Platelets 122 (L) 150 - 400 K/uL  Basic metabolic panel     Status: Abnormal   Collection Time: 12/18/15  2:13 AM  Result Value Ref Range   Sodium 140 135 - 145 mmol/L   Potassium 3.8 3.5 - 5.1 mmol/L   Chloride 109 101 - 111 mmol/L   CO2 24 22 - 32 mmol/L   Glucose, Bld 133 (H) 65 - 99 mg/dL   BUN 13 6 - 20 mg/dL   Creatinine, Ser 1.28 (H) 0.61 - 1.24 mg/dL   Calcium 8.2 (L) 8.9 - 10.3 mg/dL   GFR calc non Af Amer 51 (L) >60 mL/min   GFR calc Af Amer 59 (L) >60 mL/min   Anion gap 7 5 - 15  Protime-INR     Status: None   Collection Time: 12/18/15  2:13 AM  Result Value Ref Range   Prothrombin Time 14.2 11.4 - 15.2 seconds   INR 1.09   Glucose, capillary     Status: Abnormal   Collection Time: 12/18/15  3:18 AM  Result Value Ref Range   Glucose-Capillary 130 (H) 65 - 99 mg/dL    Comment 1 Notify RN    Comment 2 Document in Chart      Studies/Results: No results found.  . diltiazem  120 mg Oral QHS  . docusate sodium  100 mg Oral BID  . famotidine (PEPCID) IV  20 mg Intravenous Q12H  . insulin aspart  0-20 Units Subcutaneous Q4H  . irbesartan  37.5 mg Oral Daily  . iron polysaccharides  150 mg Oral BID  . linagliptin  5 mg Oral Daily  . metFORMIN  1,000 mg Oral BID WC  . mometasone-formoterol  2 puff Inhalation BID  . potassium chloride SA  20 mEq Oral Daily  . psyllium  1 packet Oral BID  . torsemide  40 mg Oral Daily     Assessment/Plan: s/p Procedure(s): PROCTOSCOPY WITH CONTROL OF BLEEDING  Rectal bleeding secondary to prostate biopsy and Coumadin anticoagulation,  complicated by transient hemorrhagic shock. POD#2 procto, control, packing Bleeding appears to have stopped Discharge home today if he ambulates reasonably well this morning I told him to hold off on restarting his Coumadin. I told him to see [name] this week or early next week to decide about restarting it. I suggested holding off on Coumadin for 7 days and then restarting for his atrial fibrillation, but that decision needs to be made by his managing physician, Dr. Deforest Hoyles  Prostate cancer.  Patient not aware of his diagnosis she had.  I Discussed with Dr. Karsten Ro.  To follow-up with Dr. Oren Bracket regarding his biopsy. Severe left-sided diverticulosis on colonoscopy 3 years ago.  This does not appear to be the source of bleeding Atrial fibrillation, status post Cox-Maze procedure a cardioversion CHF Hypertension COPD Aortic stenosis Diabetes Bipolar by history  @PROBHOSP @  LOS: 2 days    Neng Albee M 12/18/2015  . .prob

## 2015-12-18 NOTE — Discharge Summary (Signed)
[facility] Discharge Summary   Patient ID: JAAZIAH PADGETT MRN: [id number] DOB/AGE: 80-22-1934 82 y.o.  Admit date: 12/16/2015 Discharge date: 12/18/2015  Admitting Diagnosis: Postoperative hemorrhagic shock  Discharge Diagnosis Patient Active Problem List   Diagnosis Date Noted  . Bright red rectal bleeding 12/16/2015  . Rectal bleeding 12/16/2015  . COPD (chronic obstructive pulmonary disease) with acute bronchitis ([place]) 06/29/2015  . Pulmonary hypertension 06/29/2015  . Fluid overload 06/29/2015  . Acute CHF ([place]) 06/29/2015  . COPD exacerbation ([place]) 06/29/2015  . Anemia 10/22/2014  . Chest pain 08/04/2013  . Moderate aortic stenosis 04/17/2013  . Encounter for monitoring coumadin therapy 04/17/2013  . Anemia, iron deficiency 04/04/2013  . Dyspnea on exertion 11/18/2012  . Obesity hypoventilation syndrome ([place]) 09/19/2012  . ALLERGIC RHINITIS 06/24/2009  . DM2 (diabetes mellitus, type 2) ([place]) 03/13/2007  . Obstructive sleep apnea 03/13/2007  . Atrial fibrillation ([place]) 03/13/2007  . COPD mixed type ([place]) 03/13/2007    Consultants Kathie Rhodes Md, urology Ronald Lobo Md, gastroenterology Jennet Maduro Md, CCM  Imaging: No results found.  Procedures Dr. Dalbert Batman (12/16/15) - Examination under anesthesia, anoscopy, proctoscopy, insertion of rectal packing  Hospital Course:  Joel [name] is an 82yo male who presented to [facility] via EMS 12/16/15 with active bleeding from his rectum following a prostate biopsy 5 days prior.  Patient was resuscitated in the ED with 3 uPRBC, 2 uFFP, Kcentra, vitamin K, IVF, and TXA per rectum. A blakemore tube was also inserted rectally to tamponade the bleeding.  Patient was then taken to the OR for the above mentioned procedure.  No active bleeding was found during surgery. He tolerated procedure well and was transferred to the ICU.  Hemoglobin remained stable for 2 days following surgery. On POD2 the patient was voiding  well, tolerating diet, ambulating at baseline, pain well controlled, vital signs stable and felt stable for discharge home. He was instructed to remain off coumadin until discussing with PCP when to restart within the next week. He will also follow-up with urology regarding prostate biopsy results.     Medication List    STOP taking these medications   warfarin 5 MG tablet Commonly known as:  COUMADIN     TAKE these medications   allopurinol 100 MG tablet Commonly known as:  ZYLOPRIM Take 100 mg by mouth daily.   buPROPion 150 MG 24 hr tablet Commonly known as:  WELLBUTRIN XL Take 1 tablet (150 mg total) by mouth daily.   diltiazem 120 MG 24 hr capsule Commonly known as:  DILACOR XR Take 120 mg by mouth at bedtime.   furosemide 40 MG tablet Commonly known as:  LASIX Take 40 mg by mouth daily.   iron polysaccharides 150 MG capsule Commonly known as:  NIFEREX Take 1 capsule (150 mg total) by mouth 2 (two) times daily.   JANUVIA 100 MG tablet Generic drug:  sitaGLIPtin Take 1 tablet by mouth daily.   lovastatin 20 MG tablet Commonly known as:  MEVACOR Take 20 mg by mouth every morning.   metFORMIN 1000 MG tablet Commonly known as:  GLUCOPHAGE Take 1,000 mg by mouth 2 (two) times daily.   ONE TOUCH ULTRA TEST test strip Generic drug:  glucose blood   polyethylene glycol packet Commonly known as:  MIRALAX / GLYCOLAX Take 17 g by mouth 2 (two) times daily. What changed:  when to take this   potassium chloride SA 20 MEQ tablet Commonly known as:  K-DUR,KLOR-CON Take 1 tablet by  mouth daily.   senna-docusate 8.6-50 MG tablet Commonly known as:  Senokot-S Take 1 tablet by mouth at bedtime.   SYMBICORT 160-4.5 MCG/ACT inhaler Generic drug:  budesonide-formoterol Inhale 2 puffs into the lungs 2 (two) times daily.   temazepam 15 MG capsule Commonly known as:  RESTORIL Take 1 capsule by mouth at bedtime.   Tiotropium Bromide-Olodaterol 2.5-2.5 MCG/ACT  Aers Commonly known as:  STIOLTO RESPIMAT Inhale 2 puffs into the lungs daily.    valsartan 160 MG tablet Commonly known as:  DIOVAN Take 160 mg by mouth every morning.        Follow-up Information    Claybon Jabs, MD Follow up.   Specialty:  Urology Why:  For your appiontment as previously scheduled to discuss prostate biopsy results Contact information: [place] [address] [phone]        [name],[name], MD. Call.   Specialty:  Internal Medicine Why:  Within 3-4 days to reassess coumadin dosing Contact information: [address] [place] [address] [phone]           Signed: Jerrye Beavers, [facility] 12/18/2015, 1:51 PM Pager: [phone] Consults: [phone] Mon-Fri 7:00 am-4:30 pm Sat-Sun 7:00 am-11:30 am

## 2015-12-18 NOTE — Consult Note (Signed)
[facility] Consult   Reason for Consult:  Medication management for depression Referring Physician:  Dr. Clydene Laming Patient Identification: Joel [name] MRN:  [id number] Principal Diagnosis: Rectal bleeding Diagnosis:   Patient Active Problem List   Diagnosis Date Noted  . Bright red rectal bleeding [K62.5] 12/16/2015  . Rectal bleeding [K62.5] 12/16/2015  . COPD (chronic obstructive pulmonary disease) with acute bronchitis ([place]) [J44.0, J20.9] 06/29/2015  . Pulmonary hypertension [I27.20] 06/29/2015  . Fluid overload [E87.70] 06/29/2015  . Acute CHF ([place]) [I50.9] 06/29/2015  . COPD exacerbation ([place]) [J44.1] 06/29/2015  . Anemia [D64.9] 10/22/2014  . Chest pain [R07.9] 08/04/2013  . Moderate aortic stenosis [I35.0] 04/17/2013  . Encounter for monitoring coumadin therapy [Z51.81, Z79.01] 04/17/2013  . Anemia, iron deficiency [D50.9] 04/04/2013  . Dyspnea on exertion [R06.09] 11/18/2012  . Obesity hypoventilation syndrome ([place]) [E66.2] 09/19/2012  . ALLERGIC RHINITIS [J30.9] 06/24/2009  . DM2 (diabetes mellitus, type 2) ([place]) [E11.9] 03/13/2007  . Obstructive sleep apnea [G47.33] 03/13/2007  . Atrial fibrillation ([place]) [I48.91] 03/13/2007  . COPD mixed type ([place]) [J44.9] 03/13/2007    Total Time spent with patient: 45 minutes  Subjective:   Joel [name] is a 83 y.o. male patient admitted with rectal bleeding.  HPI:  Joel [name] is an 82yo male admitted to [facility] hospital for rectal bleeding which was controlled at this time. Patient asked staff regarding psych consult due to stopped his medication lexapro due to sexual side effects, which he was taken over 12 years. He stated that his wife died about two years ago and now he found a lady for himself but he can not do any thing with his medication. He is seeking different antidepressant medication as he continue to have symptom of depression like irritable, not feeling good and low self esteem. He has denied suicide  or homicide ideation, intention or plans. He has no evidence of mania or psychosis. He is familiar with wellbutrin XL because his wife was on it and feels fine to start it and follow up with Dr. Deforest Hoyles at [facility]  Past Psychiatric History: Patient has been diagnosed with bipolar disorder when he was in his 53's and taken lithium and ECT, but has no manic episodes for the last 12  Years and taking Lexapro from PCP, [place] at [facility].   Risk to Self: Is patient at risk for suicide?: No Risk to Others:   Prior Inpatient Therapy:   Prior Outpatient Therapy:    Past Medical History:  Past Medical History:  Diagnosis Date  . Allergic rhinitis   . Anemia   . Anxiety   . Aortic stenosis 2012   mild to mod   . Atrial fibrillation ([place]) 1991   with multiple DCCV  . Bipolar affective disorder ([place])   . COPD (chronic obstructive pulmonary disease) ([place])   . Diabetic neuropathy ([place])    in feet  . Dyslipidemia   . Fatigue    last year or so  . Gout   . Hypertension   . Insomnia   . Obesity   . On home oxygen therapy 2 1/2 liters with bipap at night  . OSA (obstructive sleep apnea)    severe, uses bipap 16 1/2 by 12 or 13 setting  . Rectal bleeding 12/16/2015  . Type 2 diabetes mellitus ([place])   . Vertigo     Past Surgical History:  Procedure Laterality Date  . CARDIOVERSION  yrs ago  . COLONOSCOPY WITH PROPOFOL N/A  01/23/2013   Procedure: COLONOSCOPY WITH PROPOFOL;  Surgeon: Garlan Fair, MD;  Location: [facility];  Service: Endoscopy;  Laterality: N/A;  . electro shock  1969   for depression  . ESOPHAGOGASTRODUODENOSCOPY (EGD) WITH PROPOFOL N/A 01/23/2013   Procedure: ESOPHAGOGASTRODUODENOSCOPY (EGD) WITH PROPOFOL;  Surgeon: Garlan Fair, MD;  Location: [facility];  Service: Endoscopy;  Laterality: N/A;  . HEMORRHOID SURGERY N/A 12/16/2015   Procedure: PROCTOSCOPY WITH CONTROL OF BLEEDING;  Surgeon: Fanny Skates, MD;  Location: [place];   Service: General;  Laterality: N/A;  . KNEE SURGERY  1970's   rt  . MAZE  1998  . PROSTATE BIOPSY    . SHOULDER SURGERY  7-8 yrs ago   rt   Family History:  Family History  Problem Relation Age of Onset  . Tuberculosis Maternal Grandmother   . Heart attack Neg Hx   . Diabetes Neg Hx   . CAD Neg Hx   . Cancer Neg Hx    Family Psychiatric  History: Significant for depression in his wife. He has significant for mental illness in his family members.  Social History:  History  Alcohol Use No    Comment: in AA since 1977, no alcohol since 1977     History  Drug Use No    Comment: marijuana use 37 years ago    Social History   Social History  . Marital status: Married    Spouse name: N/A  . Number of children: N/A  . Years of education: N/A   Occupational History  . Surveyor    Social History Main Topics  . Smoking status: Former Smoker    Packs/day: 2.00    Years: 25.00    Types: Cigarettes    Quit date: 07/08/1976  . Smokeless tobacco: Never Used  . Alcohol use No     Comment: in AA since 1977, no alcohol since 1977  . Drug use: No     Comment: marijuana use 37 years ago  . Sexual activity: Not Asked   Other Topics Concern  . None   Social History Narrative  . None   Additional Social History:    Allergies:   Allergies  Allergen Reactions  . Penicillins Anaphylaxis  . Antihistamines, Loratadine-Type     depression  . Meclizine     depression  . Other     Beta blockers-Novacaine depression    Labs:  Results for orders placed or performed during the hospital encounter of 12/16/15 (from the past 48 hour(s))  I-STAT 4, (NA,K, GLUC, HGB,HCT)     Status: Abnormal   Collection Time: 12/16/15  1:44 PM  Result Value Ref Range   Sodium 142 135 - 145 mmol/L   Potassium 5.3 (H) 3.5 - 5.1 mmol/L   Glucose, Bld 179 (H) 65 - 99 mg/dL   HCT 31.0 (L) 39.0 - 52.0 %   Hemoglobin 10.5 (L) 13.0 - 17.0 g/dL  Glucose, capillary     Status: Abnormal   Collection  Time: 12/16/15  2:16 PM  Result Value Ref Range   Glucose-Capillary 169 (H) 65 - 99 mg/dL  Glucose, capillary     Status: Abnormal   Collection Time: 12/16/15  4:03 PM  Result Value Ref Range   Glucose-Capillary 168 (H) 65 - 99 mg/dL   Comment 1 Notify RN   CBC     Status: Abnormal   Collection Time: 12/16/15  5:05 PM  Result Value Ref Range   WBC 10.7 (H) 4.0 -  10.5 K/uL   RBC 3.70 (L) 4.22 - 5.81 MIL/uL   Hemoglobin 9.9 (L) 13.0 - 17.0 g/dL   HCT 32.0 (L) 39.0 - 52.0 %   MCV 86.5 78.0 - 100.0 fL   MCH 26.8 26.0 - 34.0 pg   MCHC 30.9 30.0 - 36.0 g/dL   RDW 19.1 (H) 11.5 - 15.5 %   Platelets 136 (L) 150 - 400 K/uL  Protime-INR     Status: Abnormal   Collection Time: 12/16/15  5:05 PM  Result Value Ref Range   Prothrombin Time 16.0 (H) 11.4 - 15.2 seconds   INR 1.27   MRSA PCR Screening     Status: None   Collection Time: 12/16/15  5:39 PM  Result Value Ref Range   MRSA by PCR NEGATIVE NEGATIVE    Comment:        The GeneXpert MRSA Assay (FDA approved for NASAL specimens only), is one component of a comprehensive MRSA colonization surveillance program. It is not intended to diagnose MRSA infection nor to guide or monitor treatment for MRSA infections.   Glucose, capillary     Status: Abnormal   Collection Time: 12/16/15  8:36 PM  Result Value Ref Range   Glucose-Capillary 141 (H) 65 - 99 mg/dL   Comment 1 Notify RN   Glucose, capillary     Status: Abnormal   Collection Time: 12/17/15 12:57 AM  Result Value Ref Range   Glucose-Capillary 115 (H) 65 - 99 mg/dL   Comment 1 Notify RN   CBC     Status: Abnormal   Collection Time: 12/17/15  2:03 AM  Result Value Ref Range   WBC 8.2 4.0 - 10.5 K/uL   RBC 3.21 (L) 4.22 - 5.81 MIL/uL   Hemoglobin 8.7 (L) 13.0 - 17.0 g/dL   HCT 27.4 (L) 39.0 - 52.0 %   MCV 85.4 78.0 - 100.0 fL   MCH 27.1 26.0 - 34.0 pg   MCHC 31.8 30.0 - 36.0 g/dL   RDW 19.5 (H) 11.5 - 15.5 %   Platelets 146 (L) 150 - 400 K/uL  Basic metabolic panel      Status: Abnormal   Collection Time: 12/17/15  2:03 AM  Result Value Ref Range   Sodium 140 135 - 145 mmol/L   Potassium 4.3 3.5 - 5.1 mmol/L    Comment: DELTA CHECK NOTED   Chloride 111 101 - 111 mmol/L   CO2 23 22 - 32 mmol/L   Glucose, Bld 111 (H) 65 - 99 mg/dL   BUN 14 6 - 20 mg/dL   Creatinine, Ser 1.10 0.61 - 1.24 mg/dL   Calcium 8.1 (L) 8.9 - 10.3 mg/dL   GFR calc non Af Amer >60 >60 mL/min   GFR calc Af Amer >60 >60 mL/min    Comment: (NOTE) The eGFR has been calculated using the CKD EPI equation. This calculation has not been validated in all clinical situations. eGFR's persistently <60 mL/min signify possible Chronic Kidney Disease.    Anion gap 6 5 - 15  APTT     Status: None   Collection Time: 12/17/15  2:03 AM  Result Value Ref Range   aPTT 34 24 - 36 seconds  Protime-INR     Status: Abnormal   Collection Time: 12/17/15  2:03 AM  Result Value Ref Range   Prothrombin Time 16.0 (H) 11.4 - 15.2 seconds   INR 1.27   Magnesium     Status: None   Collection Time: 12/17/15  2:03  AM  Result Value Ref Range   Magnesium 2.1 1.7 - 2.4 mg/dL  Phosphorus     Status: None   Collection Time: 12/17/15  2:03 AM  Result Value Ref Range   Phosphorus 2.7 2.5 - 4.6 mg/dL  Glucose, capillary     Status: Abnormal   Collection Time: 12/17/15  4:57 AM  Result Value Ref Range   Glucose-Capillary 119 (H) 65 - 99 mg/dL   Comment 1 Notify RN   Provider-confirm verbal Blood Bank order - RBC, FFP, Type & Screen; 2 Units; Order taken: 12/16/2015; 11:06 AM; Emergency Release, STAT, Patient actively bleeding 2 units of O negative red cells and 2 units of A plasmas emergency released to the E...     Status: None   Collection Time: 12/17/15  7:30 AM  Result Value Ref Range   Blood product order confirm MD AUTHORIZATION REQUESTED   Glucose, capillary     Status: Abnormal   Collection Time: 12/17/15  8:17 AM  Result Value Ref Range   Glucose-Capillary 159 (H) 65 - 99 mg/dL   Comment 1  Notify RN    Comment 2 Document in Chart   Glucose, capillary     Status: Abnormal   Collection Time: 12/17/15 11:45 AM  Result Value Ref Range   Glucose-Capillary 187 (H) 65 - 99 mg/dL   Comment 1 Notify RN    Comment 2 Document in Chart   Glucose, capillary     Status: Abnormal   Collection Time: 12/17/15  3:10 PM  Result Value Ref Range   Glucose-Capillary 132 (H) 65 - 99 mg/dL   Comment 1 Document in Chart   Glucose, capillary     Status: Abnormal   Collection Time: 12/17/15  7:56 PM  Result Value Ref Range   Glucose-Capillary 157 (H) 65 - 99 mg/dL   Comment 1 Notify RN    Comment 2 Document in Chart   Glucose, capillary     Status: Abnormal   Collection Time: 12/17/15 11:19 PM  Result Value Ref Range   Glucose-Capillary 165 (H) 65 - 99 mg/dL   Comment 1 Notify RN    Comment 2 Document in Chart   CBC     Status: Abnormal   Collection Time: 12/18/15  2:13 AM  Result Value Ref Range   WBC 4.6 4.0 - 10.5 K/uL   RBC 3.07 (L) 4.22 - 5.81 MIL/uL   Hemoglobin 8.3 (L) 13.0 - 17.0 g/dL   HCT 26.6 (L) 39.0 - 52.0 %   MCV 86.6 78.0 - 100.0 fL   MCH 27.0 26.0 - 34.0 pg   MCHC 31.2 30.0 - 36.0 g/dL   RDW 19.5 (H) 11.5 - 15.5 %   Platelets 122 (L) 150 - 400 K/uL  Basic metabolic panel     Status: Abnormal   Collection Time: 12/18/15  2:13 AM  Result Value Ref Range   Sodium 140 135 - 145 mmol/L   Potassium 3.8 3.5 - 5.1 mmol/L   Chloride 109 101 - 111 mmol/L   CO2 24 22 - 32 mmol/L   Glucose, Bld 133 (H) 65 - 99 mg/dL   BUN 13 6 - 20 mg/dL   Creatinine, Ser 1.28 (H) 0.61 - 1.24 mg/dL   Calcium 8.2 (L) 8.9 - 10.3 mg/dL   GFR calc non Af Amer 51 (L) >60 mL/min   GFR calc Af Amer 59 (L) >60 mL/min    Comment: (NOTE) The eGFR has been calculated using the CKD EPI  equation. This calculation has not been validated in all clinical situations. eGFR's persistently <60 mL/min signify possible Chronic Kidney Disease.    Anion gap 7 5 - 15  Protime-INR     Status: None    Collection Time: 12/18/15  2:13 AM  Result Value Ref Range   Prothrombin Time 14.2 11.4 - 15.2 seconds   INR 1.09   Glucose, capillary     Status: Abnormal   Collection Time: 12/18/15  3:18 AM  Result Value Ref Range   Glucose-Capillary 130 (H) 65 - 99 mg/dL   Comment 1 Notify RN    Comment 2 Document in Chart   Glucose, capillary     Status: Abnormal   Collection Time: 12/18/15  7:47 AM  Result Value Ref Range   Glucose-Capillary 122 (H) 65 - 99 mg/dL   Comment 1 Notify RN    Comment 2 Document in Chart   Glucose, capillary     Status: Abnormal   Collection Time: 12/18/15 11:22 AM  Result Value Ref Range   Glucose-Capillary 202 (H) 65 - 99 mg/dL   Comment 1 Notify RN    Comment 2 Document in Chart     Current Facility-Administered Medications  Medication Dose Route Frequency Provider Last Rate Last Dose  . diltiazem (DILACOR XR) 24 hr capsule 120 mg  120 mg Oral QHS Fanny Skates, MD   120 mg at 12/17/15 2133  . docusate sodium (COLACE) capsule 100 mg  100 mg Oral BID Fanny Skates, MD   100 mg at 12/18/15 0942  . famotidine (PEPCID) IVPB 20 mg premix  20 mg Intravenous Q12H Fanny Skates, MD   20 mg at 12/18/15 0932  . insulin aspart (novoLOG) injection 0-20 Units  0-20 Units Subcutaneous Q4H Fanny Skates, MD   7 Units at 12/18/15 1157  . irbesartan (AVAPRO) tablet 37.5 mg  37.5 mg Oral Daily Fanny Skates, MD   37.5 mg at 12/18/15 0943  . iron polysaccharides (NIFEREX) capsule 150 mg  150 mg Oral BID Fanny Skates, MD   150 mg at 12/18/15 0942  . linagliptin (TRADJENTA) tablet 5 mg  5 mg Oral Daily Fanny Skates, MD   5 mg at 12/18/15 0942  . metFORMIN (GLUCOPHAGE) tablet 1,000 mg  1,000 mg Oral BID WC Fanny Skates, MD   1,000 mg at 12/18/15 0930  . mometasone-formoterol (DULERA) 200-5 MCG/ACT inhaler 2 puff  2 puff Inhalation BID Fanny Skates, MD   2 puff at 12/18/15 [phone]  . morphine 2 MG/ML injection 1-2 mg  1-2 mg Intravenous Q1H PRN Fanny Skates, MD      .  potassium chloride SA (K-DUR,KLOR-CON) CR tablet 20 mEq  20 mEq Oral Daily Fanny Skates, MD   20 mEq at 12/18/15 0941  . psyllium (HYDROCIL/METAMUCIL) packet 1 packet  1 packet Oral BID Fanny Skates, MD      . torsemide [facility]) tablet 40 mg  40 mg Oral Daily Fanny Skates, MD   40 mg at 12/17/15 1126    Musculoskeletal: Strength & Muscle Tone: within normal limits Gait & Station: normal Patient leans: N/A  Psychiatric Specialty Exam: Physical Exam as per history and physical  ROS No complaints today except ongoing depression, irritability and low self esteem GI bleeding stopped and no nausea, vomiting, SOBand chest pain. No Fever-chills, No Headache, No changes with Vision or hearing, reports vertigo No problems swallowing food or Liquids, No Chest pain, Cough or Shortness of Breath, No Abdominal pain, No Nausea or Vommitting, Bowel  movements are regular, No Blood in stool or Urine, No dysuria, No new skin rashes or bruises, No new joints pains-aches,  No new weakness, tingling, numbness in any extremity, No recent weight gain or loss, No polyuria, polydypsia or polyphagia,  A full 10 point Review of Systems was done, except as stated above, all other Review of Systems were negative.  Blood pressure (!) 141/64, pulse 70, temperature 97.6 F (36.4 C), temperature source Oral, resp. rate (!) 26, weight 106 kg (233 lb 11 oz), SpO2 99 %.Body mass index is 30.83 kg/m.  General Appearance: Casual  Eye Contact:  Good  Speech:  Clear and Coherent  Volume:  Normal  Mood:  Depressed  Affect:  Constricted and Depressed  Thought Process:  Coherent and Goal Directed  Orientation:  Full (Time, Place, and Person)  Thought Content:  WDL  Suicidal Thoughts:  No  Homicidal Thoughts:  No  Memory:  Immediate;   Good Recent;   Fair Remote;   Fair  Judgement:  Intact  Insight:  Good  Psychomotor Activity:  Normal  Concentration:  Concentration: Good and Attention Span: Good   Recall:  Good  Fund of Knowledge:  Good  Language:  Good  Akathisia:  Negative  Handed:  Right  AIMS (if indicated):     Assets:  Communication Skills Desire for Improvement Financial Resources/Insurance Housing Intimacy Leisure Time Resilience Social Support Talents/Skills Transportation Vocational/Educational  ADL's:  Intact  Cognition:  WNL  Sleep:        Treatment Plan Summary: Daily contact with patient to assess and evaluate symptoms and progress in treatment and Medication management Start Wellbutrin XL 150 mg PO Qam after brief discussion about risks and benefit of the medication Reportedly his wife used to take this same medication   Discontinued Lexapro about few months ago due to lack of sexual arousal by patient. Appreciate psychiatric consultation and we sign off as of today Please contact [phone] or [phone] if needs further assistance   Disposition: No evidence of imminent risk to self or others at present.   Patient does not meet criteria for psychiatric inpatient admission. Supportive therapy provided about ongoing stressors.  Ambrose Finland, MD 12/18/2015 1:13 PM

## 2015-12-18 NOTE — Progress Notes (Signed)
Patient ID: Joel [name], male   DOB: 1932/12/17, 82 y.o.   MRN: [id number]  Patient feeling well this afternoon. Still weak but at his baseline. States that he wants to go home and has a friend that will help look after him. He knows that he should call PCP either today or tomorrow to arrange follow-up. He knows to hold his coumadin until Dr. Lorenda Hatchet restarts this medication.  BROOKE A MILLER

## 2015-12-20 LAB — TYPE AND SCREEN
ABO/RH(D): O POS
Antibody Screen: NEGATIVE
UNIT DIVISION: 0
UNIT DIVISION: 0
UNIT DIVISION: 0
UNIT DIVISION: 0
UNIT DIVISION: 0
UNIT DIVISION: 0
Unit division: 0
Unit division: 0
Unit division: 0
Unit division: 0
Unit division: 0
Unit division: 0
Unit division: 0
Unit division: 0

## 2015-12-24 DIAGNOSIS — E291 Testicular hypofunction: Secondary | ICD-10-CM | POA: Diagnosis not present

## 2015-12-24 DIAGNOSIS — Z7901 Long term (current) use of anticoagulants: Secondary | ICD-10-CM | POA: Diagnosis not present

## 2015-12-29 DIAGNOSIS — C61 Malignant neoplasm of prostate: Secondary | ICD-10-CM | POA: Diagnosis not present

## 2015-12-29 DIAGNOSIS — E291 Testicular hypofunction: Secondary | ICD-10-CM | POA: Diagnosis not present

## 2015-12-30 ENCOUNTER — Encounter: Payer: Self-pay | Admitting: Radiation Oncology

## 2016-01-06 ENCOUNTER — Encounter: Payer: Self-pay | Admitting: Radiation Oncology

## 2016-01-06 NOTE — Progress Notes (Signed)
GU Location of Tumor / Histology: prostatic adenocarcinoma  If Prostate Cancer, Gleason Score is (4 + 4) and PSA is (9.90) on 04/08/15. Prostate volume: 35 cc.  07/2004  PSA  6.95 09/2004  PSA 2.05 01/2010 PSA 5.29 04/2015  PSA 9.90  Biopsies of prostate (if applicable) revealed:    Past/Anticipated interventions by urology, if any: testosterone injections - last one two weeks ago understands he has to stop, biopsy, referral to Dr. Tammi Klippel  Past/Anticipated interventions by medical oncology, if any: no  Weight changes, if any: no  Bowel/Bladder complaints, if any:  IPSS 5 with frequency. Denies dysuria, hematuria, incontinence or leakage.   Nausea/Vomiting, if any: no  Pain issues, if any:  Chronic right shoulder pain  SAFETY ISSUES:  Prior radiation? No  Pacemaker/ICD? no  Possible current pregnancy? no  Is the patient on methotrexate? no  Current Complaints / other details:  82 year old male. Widowed. PCP is Dr. Lysle Rubens. Dr. Tammi Klippel tx his wife for lung cancer. Unfortunately, his wife passed 2.5 years ago from complications of COPD.

## 2016-01-07 ENCOUNTER — Encounter: Payer: Self-pay | Admitting: Radiation Oncology

## 2016-01-07 ENCOUNTER — Ambulatory Visit
Admission: RE | Admit: 2016-01-07 | Discharge: 2016-01-07 | Disposition: A | Discharge status: Home / Self Care | Payer: PPO | Source: Ambulatory Visit | Attending: Radiation Oncology | Admitting: Radiation Oncology

## 2016-01-07 VITALS — BP 133/71 | HR 65 | Resp 18 | Ht 72.0 in | Wt 240.6 lb

## 2016-01-07 DIAGNOSIS — Z9889 Other specified postprocedural states: Secondary | ICD-10-CM | POA: Insufficient documentation

## 2016-01-07 DIAGNOSIS — Z7984 Long term (current) use of oral hypoglycemic drugs: Secondary | ICD-10-CM | POA: Diagnosis not present

## 2016-01-07 DIAGNOSIS — Z923 Personal history of irradiation: Secondary | ICD-10-CM | POA: Diagnosis not present

## 2016-01-07 DIAGNOSIS — C61 Malignant neoplasm of prostate: Secondary | ICD-10-CM | POA: Insufficient documentation

## 2016-01-07 DIAGNOSIS — Z79899 Other long term (current) drug therapy: Secondary | ICD-10-CM | POA: Insufficient documentation

## 2016-01-07 DIAGNOSIS — Z87891 Personal history of nicotine dependence: Secondary | ICD-10-CM | POA: Insufficient documentation

## 2016-01-07 DIAGNOSIS — E119 Type 2 diabetes mellitus without complications: Secondary | ICD-10-CM | POA: Diagnosis not present

## 2016-01-07 DIAGNOSIS — Z880 Allergy status to penicillin: Secondary | ICD-10-CM | POA: Insufficient documentation

## 2016-01-07 DIAGNOSIS — Z7901 Long term (current) use of anticoagulants: Secondary | ICD-10-CM | POA: Insufficient documentation

## 2016-01-07 HISTORY — DX: Malignant neoplasm of prostate: C61

## 2016-01-07 NOTE — Addendum Note (Signed)
Encounter addended by: Tyler Pita, MD on: 01/07/2016  5:04 PM<BR>    Actions taken: Visit Navigator Flowsheet section accepted

## 2016-01-07 NOTE — Progress Notes (Signed)
See progress note under physician encounter. 

## 2016-01-07 NOTE — Progress Notes (Signed)
Radiation Oncology         [phone]) [id number] ________________________________  Initial outpatient Consultation  Name: Joel [name] MRN: [id number]  Date: 01/07/2016  DOB: [birth year]  [id number], MD  Kathie Rhodes, MD   REFERRING PHYSICIAN: Kathie Rhodes, MD  DIAGNOSIS: 82 y.o. gentleman with stage T2a adenocarcinoma of the prostate with a Gleason's score of 4+4 and a PSA of 9.90    ICD-9-CM ICD-10-CM   1. Malignant neoplasm of prostate ([place]) Joel [name] is a 82 y.o. gentleman.  He was noted to have an elevated PSA of 9.90 by his primary care physician, [name].  Accordingly, he was referred for evaluation in urology by Dr. Karsten Ro on 12/11/2015,  digital rectal examination was performed at that time revealing right mid gland 10 mm nodule and 2+ size.  The patient proceeded to transrectal ultrasound with 12 biopsies of the prostate on 12/11/2015.  The prostate volume measured 35.2 cc.  Out of 12 core biopsies, 4 were positive.  The maximum Gleason score was 4+4, and this was seen in the right lateral apex.  The patient reviewed the biopsy results with his urologist and he has kindly been referred today for discussion of potential radiation treatment options.        PREVIOUS RADIATION THERAPY: No  PAST MEDICAL HISTORY:  has a past medical history of Allergic rhinitis; Anemia; Anxiety; Aortic stenosis (2012); Atrial fibrillation ([place]) (1991); Bipolar affective disorder ([place]); COPD (chronic obstructive pulmonary disease) ([place]); Diabetic neuropathy ([place]); Dyslipidemia; Fatigue; Gout; Hypertension; Insomnia; Obesity; On home oxygen therapy (2 1/2 liters with bipap at night); OSA (obstructive sleep apnea); Prostate cancer ([place]); Rectal bleeding (12/16/2015); Type 2 diabetes mellitus ([place]); and Vertigo.    PAST SURGICAL HISTORY: Past Surgical History:  Procedure Laterality Date  . CARDIOVERSION  yrs ago  . COLONOSCOPY WITH PROPOFOL  N/A 01/23/2013   Procedure: COLONOSCOPY WITH PROPOFOL;  Surgeon: Garlan Fair, MD;  Location: [facility];  Service: Endoscopy;  Laterality: N/A;  . electro shock  1969   for depression  . ESOPHAGOGASTRODUODENOSCOPY (EGD) WITH PROPOFOL N/A 01/23/2013   Procedure: ESOPHAGOGASTRODUODENOSCOPY (EGD) WITH PROPOFOL;  Surgeon: Garlan Fair, MD;  Location: [facility];  Service: Endoscopy;  Laterality: N/A;  . HEMORRHOID SURGERY N/A 12/16/2015   Procedure: PROCTOSCOPY WITH CONTROL OF BLEEDING;  Surgeon: Fanny Skates, MD;  Location: [facility];  Service: General;  Laterality: N/A;  . KNEE SURGERY  1970's   rt  . MAZE  1998  . PROSTATE BIOPSY    . SHOULDER SURGERY  7-8 yrs ago   rt    FAMILY HISTORY: family history includes Tuberculosis in his maternal grandmother.  SOCIAL HISTORY:  reports that he quit smoking about 39 years ago. His smoking use included Cigarettes. He has a 50.00 pack-year smoking history. He has never used smokeless tobacco. He reports that he does not drink alcohol or use drugs.  ALLERGIES: Penicillins; Antihistamines, loratadine-type; Meclizine; and Other  MEDICATIONS:  Current Outpatient Prescriptions  Medication Sig Dispense Refill  . allopurinol (ZYLOPRIM) 100 MG tablet Take 100 mg by mouth daily.     Marland Kitchen buPROPion (WELLBUTRIN XL) 150 MG 24 hr tablet Take 1 tablet (150 mg total) by mouth daily. 30 tablet 1  . diltiazem (DILACOR XR) 120 MG 24 hr capsule Take 120 mg by mouth at bedtime.    . fluticasone (FLONASE) 50 MCG/ACT nasal spray     . furosemide (LASIX) 80 MG tablet     .  iron polysaccharides (NIFEREX) 150 MG capsule Take 1 capsule (150 mg total) by mouth 2 (two) times daily. 60 capsule 0  . JANUVIA 100 MG tablet Take 1 tablet by mouth daily.  1  . lovastatin (MEVACOR) 20 MG tablet Take 20 mg by mouth every morning.     . metFORMIN (GLUCOPHAGE) 1000 MG tablet Take 1,000 mg by mouth 2 (two) times daily.   0  . ONE TOUCH ULTRA TEST test strip   1  .  polyethylene glycol (MIRALAX / GLYCOLAX) packet Take 17 g by mouth 2 (two) times daily. (Patient taking differently: Take 17 g by mouth daily. ) 14 each 0  . potassium chloride SA (K-DUR,KLOR-CON) 20 MEQ tablet Take 1 tablet by mouth daily.  0  . SYMBICORT 160-4.5 MCG/ACT inhaler Inhale 2 puffs into the lungs 2 (two) times daily.  0  . temazepam (RESTORIL) 15 MG capsule Take 1 capsule by mouth at bedtime.   0  . valsartan (DIOVAN) 160 MG tablet Take 160 mg by mouth every morning.     . warfarin (COUMADIN) 5 MG tablet     . senna-docusate (SENOKOT-S) 8.6-50 MG per tablet Take 1 tablet by mouth at bedtime. (Patient not taking: Reported on 01/07/2016)    . Tiotropium Bromide-Olodaterol (STIOLTO RESPIMAT) 2.5-2.5 MCG/ACT AERS Inhale 2 puffs into the lungs daily. (Patient not taking: Reported on 01/07/2016) 1 Inhaler 0   No current facility-administered medications for this encounter.     REVIEW OF SYSTEMS:  A 15 point review of systems is documented in the electronic medical record. This was obtained by the nursing staff. However, I reviewed this with the patient to discuss relevant findings and make appropriate changes. The patient completed an IPSS and IIEF questionnaire.  His IPSS score was 5 indicating mild urinary outflow obstructive symptoms including, frequency. He denies dysuria, hematuria, incontinence, or leakage.  He indicated that his erectile function is not able to complete sexual activity. He received his last testosterone injection two weeks ago.   On review of systems, the patient reports that he is doing well overall. He denies any chest pain, shortness of breath, cough, fevers, chills, night sweats, unintended weight changes. He denies any bowel disturbances, and denies abdominal pain, nausea or vomiting. He denies any new musculoskeletal or joint aches or pains. He reports chronic right shoulder pain. A complete review of systems is obtained and is otherwise negative.   PHYSICAL EXAM:    height is 6' (1.829 m) and weight is 240 lb 9.6 oz (109.1 kg). His blood pressure is 133/71 and his pulse is 65. His respiration is 18 and oxygen saturation is 100%.    Please note the digital rectal exam findings described above.  In general this is a well appearing Caucasian male in no acute distress. He's alert and oriented x4 and appropriate throughout the examination. Cardiopulmonary assessment is negative for acute distress and he exhibits normal effort.    KPS = 90  100 - Normal; no complaints; no evidence of disease. 90   - Able to carry on normal activity; minor signs or symptoms of disease. 80   - Normal activity with effort; some signs or symptoms of disease. 81   - Cares for self; unable to carry on normal activity or to do active work. 60   - Requires occasional assistance, but is able to care for most of his personal needs. 50   - Requires considerable assistance and frequent medical care. 42   - Disabled;  requires special care and assistance. 75   - Severely disabled; hospital admission is indicated although death not imminent. 75   - Very sick; hospital admission necessary; active supportive treatment necessary. 10   - Moribund; fatal processes progressing rapidly. 0     - Dead  Karnofsky DA, Abelmann [place], Craver LS and Burchenal [facility] [phone]) The use of the nitrogen mustards in the palliative treatment of carcinoma: with particular reference to bronchogenic carcinoma Cancer 1 634-56   LABORATORY DATA:  Lab Results  Component Value Date   WBC 4.6 12/18/2015   HGB 8.3 (L) 12/18/2015   HCT 26.6 (L) 12/18/2015   MCV 86.6 12/18/2015   PLT 122 (L) 12/18/2015   Lab Results  Component Value Date   NA 140 12/18/2015   K 3.8 12/18/2015   CL 109 12/18/2015   CO2 24 12/18/2015   Lab Results  Component Value Date   ALT 16 (L) 12/16/2015   AST 23 12/16/2015   ALKPHOS 72 12/16/2015   BILITOT 0.5 12/16/2015     RADIOGRAPHY: No results found.    IMPRESSION: This gentleman  is a pleasant 82 year-old with stage T2a adenocarcinoma of the prostate with a Gleason's score of 4+4 and a PSA of 9.90.  His T-Stage, Gleason's Score, and PSA put him into the high risk group.  Accordingly he is eligible for a variety of potential treatment options including external beam radiation and 5 weeks radiation treatment with prostate seed boost.  Due to Gleason's 4+4, he would be eligible for bone scan and CT scan to rule out metastatic disease.   He experienced significant rectal bleeding after prostate biopsy so, he may not be an ideal candidate for prostate seed implant.   PLAN: Today I reviewed the findings and workup thus far.  We discussed the natural history of prostate cancer.  We reviewed the the implications of T-stage, Gleason's Score, and PSA on decision-making and outcomes in prostate cancer.  We discussed radiation treatment in the management of prostate cancer with regard to the logistics and delivery of external beam radiation treatment as well as the logistics and delivery of prostate brachytherapy.  We compared and contrasted each of these approaches and also compared these against prostatectomy.  The patient expressed interest in external beam radiotherapy.  The patient would like to proceed with prostate IMRT.  I will share my findings with Dr. Karsten Ro and move forward with scheduling placement of three gold fiducial markers into the prostate to proceed with IMRT sometime in February 2018.     I enjoyed meeting with him today, and will look forward to participating in the care of this very nice gentleman.    I spent 55 minutes face to face with the patient and more than 50% of that time was spent in counseling and/or coordination of care.   ------------------------------------------------  Sheral Apley. Tammi Klippel, M.D.  This document serves as a record of services personally performed by Tyler Pita, MD. It was created on his behalf by Arlyce Harman, a trained medical  scribe. The creation of this record is based on the scribe's personal observations and the provider's statements to them. This document has been checked and approved by the attending provider.

## 2016-01-07 NOTE — Progress Notes (Signed)
Per Caryl Pina, RN for Dr. Karsten Ro. Lupron authorization request has been started. Unfortunately, the authorization hasn't been obtained yet from [employer]). Caryl Pina, RN confirms once the authorization is obtained they will reach out to the patient to schedule.

## 2016-01-09 ENCOUNTER — Telehealth: Payer: Self-pay | Admitting: *Deleted

## 2016-01-09 NOTE — Telephone Encounter (Signed)
CALLED PATIENT TO INFORM OF LUPRON INJ. ON 01/12/16 - ARRIVAL TIME - 1:45 PM @ DR. OTTELIN'S OFFICE AND HIS GOLD SEED PLACEMENT ON 03/11/16 - ARRIVAL TIME - 12:45 PM @ DR. OTTELIN'S OFFICE AND HIS SIM ON 03-19-16 @ 1 PM @ DR. MANNING'S OFFICE, NO ANSWER WILL CALL LATER/

## 2016-01-12 ENCOUNTER — Encounter: Payer: Self-pay | Admitting: Medical Oncology

## 2016-01-12 DIAGNOSIS — Z5111 Encounter for antineoplastic chemotherapy: Secondary | ICD-10-CM | POA: Diagnosis not present

## 2016-01-12 DIAGNOSIS — C61 Malignant neoplasm of prostate: Secondary | ICD-10-CM | POA: Diagnosis not present

## 2016-01-14 DIAGNOSIS — Z7901 Long term (current) use of anticoagulants: Secondary | ICD-10-CM | POA: Diagnosis not present

## 2016-01-16 DIAGNOSIS — E662 Morbid (severe) obesity with alveolar hypoventilation: Secondary | ICD-10-CM | POA: Diagnosis not present

## 2016-01-16 DIAGNOSIS — J410 Simple chronic bronchitis: Secondary | ICD-10-CM | POA: Diagnosis not present

## 2016-01-16 DIAGNOSIS — G4733 Obstructive sleep apnea (adult) (pediatric): Secondary | ICD-10-CM | POA: Diagnosis not present

## 2016-01-21 ENCOUNTER — Other Ambulatory Visit (HOSPITAL_COMMUNITY): Payer: Self-pay | Admitting: Urology

## 2016-01-21 DIAGNOSIS — C61 Malignant neoplasm of prostate: Secondary | ICD-10-CM

## 2016-01-27 ENCOUNTER — Emergency Department (HOSPITAL_COMMUNITY): Payer: PPO

## 2016-01-27 ENCOUNTER — Observation Stay (HOSPITAL_COMMUNITY)
Admission: EM | Admit: 2016-01-27 | Discharge: 2016-01-28 | Disposition: A | Discharge status: Home / Self Care | Payer: PPO | Attending: Emergency Medicine | Admitting: Emergency Medicine

## 2016-01-27 ENCOUNTER — Encounter (HOSPITAL_COMMUNITY): Payer: Self-pay | Admitting: Emergency Medicine

## 2016-01-27 DIAGNOSIS — Z87891 Personal history of nicotine dependence: Secondary | ICD-10-CM | POA: Insufficient documentation

## 2016-01-27 DIAGNOSIS — J449 Chronic obstructive pulmonary disease, unspecified: Secondary | ICD-10-CM | POA: Diagnosis not present

## 2016-01-27 DIAGNOSIS — I509 Heart failure, unspecified: Secondary | ICD-10-CM | POA: Diagnosis not present

## 2016-01-27 DIAGNOSIS — Z7984 Long term (current) use of oral hypoglycemic drugs: Secondary | ICD-10-CM | POA: Insufficient documentation

## 2016-01-27 DIAGNOSIS — R42 Dizziness and giddiness: Secondary | ICD-10-CM

## 2016-01-27 DIAGNOSIS — I10 Essential (primary) hypertension: Secondary | ICD-10-CM | POA: Diagnosis not present

## 2016-01-27 DIAGNOSIS — Z8546 Personal history of malignant neoplasm of prostate: Secondary | ICD-10-CM | POA: Diagnosis not present

## 2016-01-27 DIAGNOSIS — E119 Type 2 diabetes mellitus without complications: Secondary | ICD-10-CM | POA: Diagnosis not present

## 2016-01-27 DIAGNOSIS — Z7901 Long term (current) use of anticoagulants: Secondary | ICD-10-CM | POA: Insufficient documentation

## 2016-01-27 DIAGNOSIS — I110 Hypertensive heart disease with heart failure: Secondary | ICD-10-CM | POA: Diagnosis not present

## 2016-01-27 DIAGNOSIS — E1140 Type 2 diabetes mellitus with diabetic neuropathy, unspecified: Secondary | ICD-10-CM | POA: Diagnosis not present

## 2016-01-27 DIAGNOSIS — H8110 Benign paroxysmal vertigo, unspecified ear: Principal | ICD-10-CM | POA: Diagnosis present

## 2016-01-27 DIAGNOSIS — J069 Acute upper respiratory infection, unspecified: Secondary | ICD-10-CM | POA: Diagnosis not present

## 2016-01-27 DIAGNOSIS — R05 Cough: Secondary | ICD-10-CM | POA: Diagnosis not present

## 2016-01-27 DIAGNOSIS — I482 Chronic atrial fibrillation, unspecified: Secondary | ICD-10-CM | POA: Diagnosis present

## 2016-01-27 DIAGNOSIS — R404 Transient alteration of awareness: Secondary | ICD-10-CM | POA: Diagnosis not present

## 2016-01-27 LAB — PROTIME-INR
INR: 2.47
Prothrombin Time: 27.2 seconds — ABNORMAL HIGH (ref 11.4–15.2)

## 2016-01-27 LAB — BASIC METABOLIC PANEL
ANION GAP: 4 — AB (ref 5–15)
BUN: 18 mg/dL (ref 6–20)
CALCIUM: 8.9 mg/dL (ref 8.9–10.3)
CO2: 25 mmol/L (ref 22–32)
Chloride: 112 mmol/L — ABNORMAL HIGH (ref 101–111)
Creatinine, Ser: 1.14 mg/dL (ref 0.61–1.24)
GFR calc Af Amer: >60 mL/min (ref >60)
GFR, EST NON AFRICAN AMERICAN: 58 mL/min — AB (ref >60)
GLUCOSE: 202 mg/dL — AB (ref 65–99)
Potassium: 4.4 mmol/L (ref 3.5–5.1)
SODIUM: 141 mmol/L (ref 135–145)

## 2016-01-27 LAB — URINALYSIS, ROUTINE W REFLEX MICROSCOPIC
Bilirubin Urine: NEGATIVE
GLUCOSE, UA: NEGATIVE mg/dL
Hgb urine dipstick: NEGATIVE
KETONES UR: 5 mg/dL — AB
LEUKOCYTES UA: NEGATIVE
NITRITE: NEGATIVE
PH: 5 (ref 5.0–8.0)
Protein, ur: NEGATIVE mg/dL
SPECIFIC GRAVITY, URINE: 1.019 (ref 1.005–1.030)

## 2016-01-27 LAB — CBC
HCT: 27.3 % — ABNORMAL LOW (ref 39.0–52.0)
HEMOGLOBIN: 8.1 g/dL — AB (ref 13.0–17.0)
MCH: 24.5 pg — ABNORMAL LOW (ref 26.0–34.0)
MCHC: 29.7 g/dL — AB (ref 30.0–36.0)
MCV: 82.5 fL (ref 78.0–100.0)
Platelets: 209 10*3/uL (ref 150–400)
RBC: 3.31 MIL/uL — ABNORMAL LOW (ref 4.22–5.81)
RDW: 16.4 % — AB (ref 11.5–15.5)
WBC: 5.3 10*3/uL (ref 4.0–10.5)

## 2016-01-27 LAB — CBG MONITORING, ED: GLUCOSE-CAPILLARY: 186 mg/dL — AB (ref 65–99)

## 2016-01-27 MED ORDER — POTASSIUM CHLORIDE CRYS ER 20 MEQ PO TBCR
20.0000 meq | EXTENDED_RELEASE_TABLET | Freq: Every day | ORAL | Status: DC
  Administered 2016-01-28: 20 meq via ORAL
  Filled 2016-01-27: qty 1

## 2016-01-27 MED ORDER — SODIUM CHLORIDE 0.9 % IV BOLUS (SEPSIS)
500.0000 mL | Freq: Once | INTRAVENOUS | Status: AC
  Administered 2016-01-27: 500 mL via INTRAVENOUS

## 2016-01-27 MED ORDER — METFORMIN HCL 500 MG PO TABS
1000.0000 mg | ORAL_TABLET | Freq: Two times a day (BID) | ORAL | Status: DC
  Administered 2016-01-28: 1000 mg via ORAL
  Filled 2016-01-27: qty 2

## 2016-01-27 MED ORDER — DM-GUAIFENESIN ER 30-600 MG PO TB12
1.0000 | ORAL_TABLET | Freq: Two times a day (BID) | ORAL | Status: DC
  Administered 2016-01-28 (×2): 1 via ORAL
  Filled 2016-01-27 (×2): qty 1

## 2016-01-27 MED ORDER — ALLOPURINOL 100 MG PO TABS
100.0000 mg | ORAL_TABLET | Freq: Every day | ORAL | Status: DC
  Administered 2016-01-28: 100 mg via ORAL
  Filled 2016-01-27: qty 1

## 2016-01-27 MED ORDER — DILTIAZEM HCL ER COATED BEADS 120 MG PO CP24
120.0000 mg | ORAL_CAPSULE | Freq: Every day | ORAL | Status: DC
  Administered 2016-01-28: 120 mg via ORAL
  Filled 2016-01-27: qty 1

## 2016-01-27 MED ORDER — IRBESARTAN 150 MG PO TABS
150.0000 mg | ORAL_TABLET | Freq: Every day | ORAL | Status: DC
  Administered 2016-01-28: 150 mg via ORAL
  Filled 2016-01-27: qty 1

## 2016-01-27 MED ORDER — WARFARIN - PHARMACIST DOSING INPATIENT: Freq: Every day | Status: DC

## 2016-01-27 MED ORDER — WARFARIN SODIUM 5 MG PO TABS
5.0000 mg | ORAL_TABLET | Freq: Once | ORAL | Status: AC
  Administered 2016-01-27: 5 mg via ORAL
  Filled 2016-01-27: qty 1

## 2016-01-27 MED ORDER — LINAGLIPTIN 5 MG PO TABS
5.0000 mg | ORAL_TABLET | Freq: Every day | ORAL | Status: DC
  Administered 2016-01-28 (×2): 5 mg via ORAL
  Filled 2016-01-27 (×2): qty 1

## 2016-01-27 MED ORDER — FUROSEMIDE 40 MG PO TABS: 80.0000 mg | ORAL_TABLET | Freq: Every day | ORAL | Status: DC

## 2016-01-27 MED ORDER — POLYSACCHARIDE IRON COMPLEX 150 MG PO CAPS
150.0000 mg | ORAL_CAPSULE | Freq: Two times a day (BID) | ORAL | Status: DC
  Administered 2016-01-28 (×2): 150 mg via ORAL
  Filled 2016-01-27 (×2): qty 1

## 2016-01-27 MED ORDER — MECLIZINE HCL 25 MG PO TABS
25.0000 mg | ORAL_TABLET | Freq: Three times a day (TID) | ORAL | Status: DC | PRN
  Filled 2016-01-27: qty 1

## 2016-01-27 MED ORDER — DIAZEPAM 2 MG PO TABS
2.0000 mg | ORAL_TABLET | Freq: Once | ORAL | Status: AC
Start: 2016-01-27 — End: 2016-01-27
  Administered 2016-01-27: 2 mg via ORAL
  Filled 2016-01-27: qty 1

## 2016-01-27 MED ORDER — PRAVASTATIN SODIUM 20 MG PO TABS: 20.0000 mg | ORAL_TABLET | Freq: Every day | ORAL | Status: DC

## 2016-01-27 MED ORDER — FUROSEMIDE 40 MG PO TABS
80.0000 mg | ORAL_TABLET | Freq: Every day | ORAL | Status: DC
  Administered 2016-01-28: 80 mg via ORAL
  Filled 2016-01-27: qty 2

## 2016-01-27 MED ORDER — MECLIZINE HCL 25 MG PO TABS
25.0000 mg | ORAL_TABLET | Freq: Once | ORAL | Status: AC
  Administered 2016-01-27: 25 mg via ORAL
  Filled 2016-01-27: qty 1

## 2016-01-27 MED ORDER — BUPROPION HCL ER (XL) 150 MG PO TB24
150.0000 mg | ORAL_TABLET | Freq: Every day | ORAL | Status: DC
  Administered 2016-01-28: 150 mg via ORAL
  Filled 2016-01-27: qty 1

## 2016-01-27 MED ORDER — PROMETHAZINE HCL 25 MG PO TABS
12.5000 mg | ORAL_TABLET | Freq: Once | ORAL | Status: AC
  Administered 2016-01-27: 25 mg via ORAL
  Filled 2016-01-27: qty 1

## 2016-01-27 MED ORDER — IRBESARTAN 150 MG PO TABS: 150.0000 mg | ORAL_TABLET | Freq: Every day | ORAL | Status: DC

## 2016-01-27 MED ORDER — DIAZEPAM 2 MG PO TABS: 2.0000 mg | ORAL_TABLET | Freq: Three times a day (TID) | ORAL | 0 refills | Status: DC | PRN

## 2016-01-27 MED ORDER — FLUTICASONE PROPIONATE 50 MCG/ACT NA SUSP: 2.0000 | Freq: Every day | NASAL | 1 refills | Status: AC

## 2016-01-27 NOTE — Progress Notes (Signed)
ANTICOAGULATION CONSULT NOTE - Initial Consult  Pharmacy Consult for Warfarin Indication: atrial fibrillation  Allergies  Allergen Reactions  . Penicillins Anaphylaxis    Has patient had a PCN reaction causing immediate rash, facial/tongue/throat swelling, SOB or lightheadedness with hypotension: unknown Has patient had a PCN reaction causing severe rash involving mucus membranes or skin necrosis: unknown Has patient had a PCN reaction that required hospitalization: unknown Has patient had a PCN reaction occurring within the last 10 years: no If all of the above answers are "NO", then may proceed with Cephalosporin use.   Marland Kitchen Antihistamines, Loratadine-Type     depression  . Meclizine     depression  . Other     Beta blockers-Novacaine depression    Patient Measurements: Height: 6' (182.9 cm) Weight: 230 lb (104.3 kg) IBW/kg (Calculated) : 77.6  Vital Signs: Temp: 97.7 F (36.5 C) (12/26 1809) Temp Source: Oral (12/26 1809) BP: 116/40 (12/26 1930) Pulse Rate: 75 (12/26 1930)  Labs:  Recent Labs  01/27/16 1454 01/27/16 1458  HGB 8.1*  --   HCT 27.3*  --   PLT 209  --   LABPROT  --  27.2*  INR  --  2.47  CREATININE 1.14  --     Estimated Creatinine Clearance: 63.5 mL/min (by C-G formula based on SCr of 1.14 mg/dL).   Medical History: Past Medical History:  Diagnosis Date  . Allergic rhinitis   . Anemia   . Anxiety   . Aortic stenosis 2012   mild to mod   . Atrial fibrillation ([place]) 1991   with multiple DCCV  . Bipolar affective disorder ([place])   . COPD (chronic obstructive pulmonary disease) ([place])   . Diabetic neuropathy ([place])    in feet  . Dyslipidemia   . Fatigue    last year or so  . Gout   . Hypertension   . Insomnia   . Obesity   . On home oxygen therapy 2 1/2 liters with bipap at night  . OSA (obstructive sleep apnea)    severe, uses bipap 16 1/2 by 12 or 13 setting  . Prostate cancer ([place])   . Rectal bleeding 12/16/2015  . Type 2 diabetes  mellitus ([place])   . Vertigo     Assessment:  82 yr male presents to ED with c/o vertigo.  PMH significant for AFib, which patient takes warfarin.    Pharmacy consulted to dose warfarin  PTA warfarin regimen:  5mg  daily except 7.5mg  on Wed & Fri  Last reported dose taken by pt on 12/25 @ 20:00  Goal of Therapy:  INR 2-3   Plan:   Warfarin 5mg  po x 1 dose tonight  Check daily INR  Lajuanda Penick, Toribio Harbour , PharmD 01/27/2016,10:14 PM

## 2016-01-27 NOTE — ED Triage Notes (Signed)
Patient from home-c/o vertigo since-0200 this morning.  States lying in bed and room was spinning.  States this has been ongoing since it began.

## 2016-01-27 NOTE — ED Notes (Signed)
Assisted patient to stand up to urinate. Pt reported his dizziness increased with movement.

## 2016-01-27 NOTE — ED Provider Notes (Signed)
Medical screening examination/treatment/procedure(s) were conducted as a shared visit with non-physician practitioner(s) and myself.  I personally evaluated the patient during the encounter.   EKG Interpretation  Date/Time:  Tuesday January 27 2016 15:52:59 EST Ventricular Rate:  78 PR Interval:    QRS Duration: 107 QT Interval:  432 QTC Calculation: 493 R Axis:   59 Text Interpretation:  Atrial fibrillation Borderline repolarization abnormality Borderline prolonged QT interval No significant change since last tracing Confirmed by Omar Gayden  MD, Adaleah Forget ([id number]) on 01/27/2016 5:31:39 PM     This is 82 year old male with history of vertigo presents with dizziness that is positional with associated mild headache. Denies any focal neurological findings. Neurological exam shows no gross deficits. Head CT was negative. Will treat for vertigo   Lacretia Leigh, MD 01/27/16 [phone]

## 2016-01-27 NOTE — ED Notes (Signed)
Patient transported to MRI 

## 2016-01-27 NOTE — ED Notes (Signed)
Pt has a urinal at bedside. Encouraged to obtain a sample.

## 2016-01-27 NOTE — H&P (Addendum)
History and Physical    Joel [name] [id number] DOB: 11/30/1932 DOA: 01/27/2016   PCP: Wenda Low, MD Chief Complaint:  Chief Complaint  Patient presents with  . Dizziness    HPI: Joel [name] is a 82 y.o. male with medical history significant of A.Fib, DM, HTN.  Patient presents to ED with c/o vertigo since last night. Patient reports he got out of bed last night and noticed room spinning dizziness and had one episode of diarrhea on the way to the bathroom. He reports his feelings of vertigo have persisted into today. He reports his symptoms are worse with movement of his head or standing. He reports receiving Zofran by EMS and his symptoms are starting to improve. He reports a history of vertigo several years ago and took meclizine. He reports the meclizine made him very depressed and he does not want to take any meclizine. He denies falling or hitting his head. He denies any headache or any complaints of pain. He does report he's been having an increasing cough for the past 3 days. Nonproductive cough. His history of COPD. He reports associated postnasal drip, sneezing and nasal congestion. He denies any shortness of breath or chest pain. He is currently on Coumadin for atrial fibrillation. He reports he's been compliant with his medication but failed to take his Coumadin this morning. Patient denies fevers, shortness of breath, chest pain, numbness, tingling, weakness, headache, neck pain, back pain, abdominal pain, vomiting, or rashes.   ED Course: Work up neg.  Has positional vertigo.  Unable to control with meds.  Review of Systems: As per HPI otherwise 10 point review of systems negative.    Past Medical History:  Diagnosis Date  . Allergic rhinitis   . Anemia   . Anxiety   . Aortic stenosis 2012   mild to mod   . Atrial fibrillation ([place]) 1991   with multiple DCCV  . Bipolar affective disorder ([place])   . COPD (chronic obstructive pulmonary disease) ([place])   . Diabetic  neuropathy ([place])    in feet  . Dyslipidemia   . Fatigue    last year or so  . Gout   . Hypertension   . Insomnia   . Obesity   . On home oxygen therapy 2 1/2 liters with bipap at night  . OSA (obstructive sleep apnea)    severe, uses bipap 16 1/2 by 12 or 13 setting  . Prostate cancer ([place])   . Rectal bleeding 12/16/2015  . Type 2 diabetes mellitus ([place])   . Vertigo     Past Surgical History:  Procedure Laterality Date  . CARDIOVERSION  yrs ago  . COLONOSCOPY WITH PROPOFOL N/A 01/23/2013   Procedure: COLONOSCOPY WITH PROPOFOL;  Surgeon: Garlan Fair, MD;  Location: [facility];  Service: Endoscopy;  Laterality: N/A;  . electro shock  1969   for depression  . ESOPHAGOGASTRODUODENOSCOPY (EGD) WITH PROPOFOL N/A 01/23/2013   Procedure: ESOPHAGOGASTRODUODENOSCOPY (EGD) WITH PROPOFOL;  Surgeon: Garlan Fair, MD;  Location: [facility];  Service: Endoscopy;  Laterality: N/A;  . HEMORRHOID SURGERY N/A 12/16/2015   Procedure: PROCTOSCOPY WITH CONTROL OF BLEEDING;  Surgeon: Fanny Skates, MD;  Location: [place] [name];  Service: General;  Laterality: N/A;  . KNEE SURGERY  1970's   rt  . MAZE  1998  . PROSTATE BIOPSY    . SHOULDER SURGERY  7-8 yrs ago   rt     reports that he quit smoking about 39 years ago. His smoking  use included Cigarettes. He has a 50.00 pack-year smoking history. He has never used smokeless tobacco. He reports that he does not drink alcohol or use drugs.  Allergies  Allergen Reactions  . Penicillins Anaphylaxis    Has patient had a PCN reaction causing immediate rash, facial/tongue/throat swelling, SOB or lightheadedness with hypotension: unknown Has patient had a PCN reaction causing severe rash involving mucus membranes or skin necrosis: unknown Has patient had a PCN reaction that required hospitalization: unknown Has patient had a PCN reaction occurring within the last 10 years: no If all of the above answers are "NO", then may proceed with Cephalosporin  use.   Marland Kitchen Antihistamines, Loratadine-Type     depression  . Meclizine     depression  . Other     Beta blockers-Novacaine depression    Family History  Problem Relation Age of Onset  . Tuberculosis Maternal Grandmother   . Heart attack Neg Hx   . Diabetes Neg Hx   . CAD Neg Hx   . Cancer Neg Hx       Prior to Admission medications   Medication Sig Start Date End Date Taking? Authorizing Provider  allopurinol (ZYLOPRIM) 100 MG tablet Take 100 mg by mouth daily.    Yes Historical Provider, MD  buPROPion (WELLBUTRIN XL) 150 MG 24 hr tablet Take 1 tablet (150 mg total) by mouth daily. 12/18/15  Yes Ambrose Finland, MD  dextromethorphan-guaiFENesin (MUCINEX DM) 30-600 MG 12hr tablet Take 1 tablet by mouth 2 (two) times daily.   Yes Historical Provider, MD  diltiazem (DILACOR XR) 120 MG 24 hr capsule Take 120 mg by mouth at bedtime.   Yes Historical Provider, MD  furosemide (LASIX) 80 MG tablet Take 80 mg by mouth daily.  10/22/15  Yes Historical Provider, MD  iron polysaccharides (NIFEREX) 150 MG capsule Take 1 capsule (150 mg total) by mouth 2 (two) times daily. 10/23/14  Yes Eugenie Filler, MD  JANUVIA 100 MG tablet Take 1 tablet by mouth daily. 06/15/15  Yes Historical Provider, MD  lovastatin (MEVACOR) 20 MG tablet Take 20 mg by mouth daily at 6 PM.    Yes Historical Provider, MD  metFORMIN (GLUCOPHAGE) 1000 MG tablet Take 1,000 mg by mouth 2 (two) times daily.  05/17/14  Yes Historical Provider, MD  OVER THE COUNTER MEDICATION Take 1 tablet by mouth 2 (two) times daily. OTC Vision supplement   Yes Historical Provider, MD  potassium chloride SA (K-DUR,KLOR-CON) 20 MEQ tablet Take 1 tablet by mouth daily. 09/10/14  Yes Historical Provider, MD  valsartan (DIOVAN) 160 MG tablet Take 160 mg by mouth every morning.    Yes Historical Provider, MD  warfarin (COUMADIN) 5 MG tablet Take 5-7.5 mg by mouth. 5 daily, wed & fri- 7.5mg  12/09/15  Yes Historical Provider, MD  diazepam (VALIUM) 2  MG tablet Take 1 tablet (2 mg total) by mouth every 8 (eight) hours as needed (vertigo). 01/27/16   Waynetta Pean, PA-C  fluticasone (FLONASE) 50 MCG/ACT nasal spray Place 2 sprays into both nostrils daily. 01/27/16   Waynetta Pean, PA-C  ONE TOUCH ULTRA TEST test strip  11/11/15   Historical Provider, MD    Physical Exam: Vitals:   01/27/16 1800 01/27/16 1809 01/27/16 1900 01/27/16 1930  BP: 137/68 137/86 (!) 121/41 (!) 116/40  Pulse: 74 77 77 75  Resp: 17 15 19 19   Temp:  97.7 F (36.5 C)    TempSrc:  Oral    SpO2: 98% 97% 98% 99%  Weight:      Height:          Constitutional: NAD, calm, comfortable Eyes: PERRL, lids and conjunctivae normal ENMT: Mucous membranes are moist. Posterior pharynx clear of any exudate or lesions.Normal dentition.  Neck: normal, supple, no masses, no thyromegaly Respiratory: clear to auscultation bilaterally, no wheezing, no crackles. Normal respiratory effort. No accessory muscle use.  Cardiovascular: Regular rate and rhythm, no murmurs / rubs / gallops. No extremity edema. 2+ pedal pulses. No carotid bruits.  Abdomen: no tenderness, no masses palpated. No hepatosplenomegaly. Bowel sounds positive.  Musculoskeletal: no clubbing / cyanosis. No joint deformity upper and lower extremities. Good ROM, no contractures. Normal muscle tone.  Skin: no rashes, lesions, ulcers. No induration Neurologic: CN 2-12 grossly intact. Sensation intact, DTR normal. Strength 5/5 in all 4.  Psychiatric: Normal judgment and insight. Alert and oriented x 3. Normal mood.    Labs on Admission: I have personally reviewed following labs and imaging studies  CBC:  Recent Labs Lab 01/27/16 1454  WBC 5.3  HGB 8.1*  HCT 27.3*  MCV 82.5  PLT [id number]   Basic Metabolic Panel:  Recent Labs Lab 01/27/16 1454  NA 141  K 4.4  CL 112*  CO2 25  GLUCOSE 202*  BUN 18  CREATININE 1.14  CALCIUM 8.9   GFR: Estimated Creatinine Clearance: 63.5 mL/min (by C-G formula based  on SCr of 1.14 mg/dL). Liver Function Tests: No results for input(s): AST, ALT, ALKPHOS, BILITOT, PROT, ALBUMIN in the last 168 hours. No results for input(s): LIPASE, AMYLASE in the last 168 hours. No results for input(s): AMMONIA in the last 168 hours. Coagulation Profile:  Recent Labs Lab 01/27/16 1458  INR 2.47   Cardiac Enzymes: No results for input(s): CKTOTAL, CKMB, CKMBINDEX, TROPONINI in the last 168 hours. BNP (last 3 results) No results for input(s): PROBNP in the last 8760 hours. HbA1C: No results for input(s): HGBA1C in the last 72 hours. CBG:  Recent Labs Lab 01/27/16 1503  GLUCAP 186*   Lipid Profile: No results for input(s): CHOL, HDL, LDLCALC, TRIG, CHOLHDL, LDLDIRECT in the last 72 hours. Thyroid Function Tests: No results for input(s): TSH, T4TOTAL, FREET4, T3FREE, THYROIDAB in the last 72 hours. Anemia Panel: No results for input(s): VITAMINB12, FOLATE, FERRITIN, TIBC, IRON, RETICCTPCT in the last 72 hours. Urine analysis:    Component Value Date/Time   COLORURINE YELLOW 01/27/2016 1730   APPEARANCEUR CLOUDY (A) 01/27/2016 1730   LABSPEC 1.019 01/27/2016 1730   PHURINE 5.0 01/27/2016 1730   GLUCOSEU NEGATIVE 01/27/2016 1730   HGBUR NEGATIVE 01/27/2016 1730   BILIRUBINUR NEGATIVE 01/27/2016 1730   KETONESUR 5 (A) 01/27/2016 1730   PROTEINUR NEGATIVE 01/27/2016 1730   UROBILINOGEN 0.2 10/22/2014 2136   NITRITE NEGATIVE 01/27/2016 1730   LEUKOCYTESUR NEGATIVE 01/27/2016 1730   Sepsis Labs: @LABRCNTIP (procalcitonin:4,lacticidven:4) )No results found for this or any previous visit (from the past 240 hour(s)).   Radiological Exams on Admission: Dg Chest 2 View  Result Date: 01/27/2016 CLINICAL DATA:  Cough for 3 days EXAM: CHEST  2 VIEW COMPARISON:  October 14, 2015 FINDINGS: There is no edema or consolidation. The heart is upper normal in size with pulmonary vascularity within normal limits. There is atherosclerotic calcification in the aorta.  No adenopathy. Patient is status post median sternotomy. There is degenerative change in the thoracic spine. There is evidence of old trauma involving the lateral right clavicle. IMPRESSION: No edema or consolidation.  Aortic atherosclerosis. Electronically Signed   By: Gwyndolyn Saxon  Jasmine December III M.D.   On: 01/27/2016 16:29   Ct Head Wo Contrast  Result Date: 01/27/2016 CLINICAL DATA:  Dizziness EXAM: CT HEAD WITHOUT CONTRAST TECHNIQUE: Contiguous axial images were obtained from the base of the skull through the vertex without intravenous contrast. COMPARISON:  None. FINDINGS: Brain: No evidence of acute infarction, hemorrhage, hydrocephalus, extra-axial collection or mass lesion/mass effect. Vascular: No hyperdense vessel or unexpected calcification. Skull: No osseous abnormality. Sinuses/Orbits: Visualized paranasal sinuses are clear. Visualized mastoid sinuses are clear. Visualized orbits demonstrate no focal abnormality. Other: None IMPRESSION: No acute intracranial pathology. Electronically Signed   By: Kathreen Devoid   On: 01/27/2016 16:48   Mr Brain Wo Contrast  Result Date: 01/27/2016 CLINICAL DATA:  Vertigo EXAM: MRI HEAD WITHOUT CONTRAST TECHNIQUE: Multiplanar, multiecho pulse sequences of the brain and surrounding structures were obtained without intravenous contrast. COMPARISON:  Head CT 01/27/2016 Brain MRI 11/07/2009 FINDINGS: Brain: No focal diffusion restriction to indicate acute infarct. No intraparenchymal hemorrhage. There is mild multifocal hyperintense T2-weighted signal within the periventricular white matter, most often seen in the setting of chronic microvascular ischemia. No mass lesion or midline shift. No hydrocephalus or extra-axial fluid collection. The midline structures are normal. No age advanced or lobar predominant atrophy. Vascular: Major intracranial arterial and venous sinus flow voids are preserved. No evidence of chronic microhemorrhage or amyloid angiopathy. Skull and upper  cervical spine: The visualized skull base, calvarium, upper cervical spine and extracranial soft tissues are normal. Sinuses/Orbits: No fluid levels or advanced mucosal thickening. No mastoid effusion. Normal orbits. IMPRESSION: Mild chronic microvascular disease. No acute intracranial abnormality. Electronically Signed   By: Ulyses Jarred M.D.   On: 01/27/2016 22:58    EKG: Independently reviewed.  Assessment/Plan Principal Problem:   BPPV (benign paroxysmal positional vertigo) Active Problems:   DM2 (diabetes mellitus, type 2) (HCC)   Atrial fibrillation (HCC)   Hypertension    1. BPPV - 1. PT vestibular eval and treat 2. PRN meclizine 3. MRI brain neg 2. HTN - continue home meds 3. DM2 - continue home meds 4. A.fib - continue cardizem, continue coumadin   DVT prophylaxis: Coumadin Code Status: Full Family Communication: No family in room Consults called: None Admission status: Place in 44, [facility] Pager [phone] from 7PM-7AM  If 7AM-7PM, please contact the day physician for the patient [email or web address] Password [email or web address]  01/27/2016, 11:02 PM

## 2016-01-27 NOTE — ED Provider Notes (Signed)
[facility] DEPT Provider Note   CSN: [id number] Arrival date & time: 01/27/16  1323     History   Chief Complaint Chief Complaint  Patient presents with  . Dizziness    HPI Joel [name] is a 82 y.o. male.  Joel [name] is a 82 y.o. Male who presents to the ED via EMS with his daughter complaining of vertigo since last night. Patient reports he got out of bed last night and noticed room spinning dizziness and had one episode of diarrhea on the way to the bathroom. He reports his feelings of vertigo have persisted into today. He reports his symptoms are worse with movement of his head or standing. He reports receiving Zofran by EMS and his symptoms are starting to improve. He reports a history of vertigo several years ago and took meclizine. He reports the meclizine made him very depressed and he does not want to take any meclizine. He denies falling or hitting his head. He denies any headache or any complaints of pain. He does report he's been having an increasing cough for the past 3 days. Nonproductive cough. His history of COPD. He reports associated postnasal drip, sneezing and nasal congestion. He denies any shortness of breath or chest pain. He is currently on Coumadin for atrial fibrillation. He reports he's been compliant with his medication but failed to take his Coumadin this morning. Patient denies fevers, shortness of breath, chest pain, numbness, tingling, weakness, headache, neck pain, back pain, abdominal pain, vomiting, or rashes.    The history is provided by the patient and a relative. No language interpreter was used.  Dizziness  Associated symptoms: nausea   Associated symptoms: no blood in stool, no chest pain, no headaches, no shortness of breath, no vomiting and no weakness     Past Medical History:  Diagnosis Date  . Allergic rhinitis   . Anemia   . Anxiety   . Aortic stenosis 2012   mild to mod   . Atrial fibrillation ([name]) 1991   with multiple DCCV   . Bipolar affective disorder ([place])   . COPD (chronic obstructive pulmonary disease) ([place])   . Diabetic neuropathy ([place])    in feet  . Dyslipidemia   . Fatigue    last year or so  . Gout   . Hypertension   . Insomnia   . Obesity   . On home oxygen therapy 2 1/2 liters with bipap at night  . OSA (obstructive sleep apnea)    severe, uses bipap 16 1/2 by 12 or 13 setting  . Prostate cancer ([place])   . Rectal bleeding 12/16/2015  . Type 2 diabetes mellitus ([place])   . Vertigo     Patient Active Problem List   Diagnosis Date Noted  . Malignant neoplasm of prostate ([place]) 01/07/2016  . Panlobular emphysema ([place])   . Arterial hypotension   . Paroxysmal atrial fibrillation ([facility])   . Lower GI bleed   . Acute blood loss anemia   . Uncontrolled type 2 diabetes mellitus with complication ([place])   . Bipolar I disorder ([place])   . Bright red rectal bleeding 12/16/2015  . Rectal bleeding 12/16/2015  . COPD (chronic obstructive pulmonary disease) with acute bronchitis ([place]) 06/29/2015  . Pulmonary hypertension 06/29/2015  . Fluid overload 06/29/2015  . Acute CHF ([place]) 06/29/2015  . COPD exacerbation ([place]) 06/29/2015  . Anemia 10/22/2014  . Chest pain 08/04/2013  . Moderate aortic stenosis 04/17/2013  . Encounter for monitoring coumadin therapy  04/17/2013  . Anemia, iron deficiency 04/04/2013  . Dyspnea on exertion 11/18/2012  . Obesity hypoventilation syndrome ([place]) 09/19/2012  . ALLERGIC RHINITIS 06/24/2009  . DM2 (diabetes mellitus, type 2) ([place]) 03/13/2007  . Obstructive sleep apnea 03/13/2007  . Atrial fibrillation ([place]) 03/13/2007  . COPD mixed type ([place]) 03/13/2007    Past Surgical History:  Procedure Laterality Date  . CARDIOVERSION  yrs ago  . COLONOSCOPY WITH PROPOFOL N/A 01/23/2013   Procedure: COLONOSCOPY WITH PROPOFOL;  Surgeon: Garlan Fair, MD;  Location: [facility];  Service: Endoscopy;  Laterality: N/A;  . electro shock  1969   for depression  .  ESOPHAGOGASTRODUODENOSCOPY (EGD) WITH PROPOFOL N/A 01/23/2013   Procedure: ESOPHAGOGASTRODUODENOSCOPY (EGD) WITH PROPOFOL;  Surgeon: Garlan Fair, MD;  Location: [facility];  Service: Endoscopy;  Laterality: N/A;  . HEMORRHOID SURGERY N/A 12/16/2015   Procedure: PROCTOSCOPY WITH CONTROL OF BLEEDING;  Surgeon: Fanny Skates, MD;  Location: [facility];  Service: General;  Laterality: N/A;  . KNEE SURGERY  1970's   rt  . MAZE  1998  . PROSTATE BIOPSY    . SHOULDER SURGERY  7-8 yrs ago   rt       Home Medications    Prior to Admission medications   Medication Sig Start Date End Date Taking? Authorizing Provider  allopurinol (ZYLOPRIM) 100 MG tablet Take 100 mg by mouth daily.    Yes Historical Provider, MD  buPROPion (WELLBUTRIN XL) 150 MG 24 hr tablet Take 1 tablet (150 mg total) by mouth daily. 12/18/15  Yes Ambrose Finland, MD  dextromethorphan-guaiFENesin (MUCINEX DM) 30-600 MG 12hr tablet Take 1 tablet by mouth 2 (two) times daily.   Yes Historical Provider, MD  diltiazem (DILACOR XR) 120 MG 24 hr capsule Take 120 mg by mouth at bedtime.   Yes Historical Provider, MD  furosemide (LASIX) 80 MG tablet Take 80 mg by mouth daily.  10/22/15  Yes Historical Provider, MD  iron polysaccharides (NIFEREX) 150 MG capsule Take 1 capsule (150 mg total) by mouth 2 (two) times daily. 10/23/14  Yes Eugenie Filler, MD  JANUVIA 100 MG tablet Take 1 tablet by mouth daily. 06/15/15  Yes Historical Provider, MD  lovastatin (MEVACOR) 20 MG tablet Take 20 mg by mouth daily at 6 PM.    Yes Historical Provider, MD  metFORMIN (GLUCOPHAGE) 1000 MG tablet Take 1,000 mg by mouth 2 (two) times daily.  05/17/14  Yes Historical Provider, MD  OVER THE COUNTER MEDICATION Take 1 tablet by mouth 2 (two) times daily. OTC Vision supplement   Yes Historical Provider, MD  potassium chloride SA (K-DUR,KLOR-CON) 20 MEQ tablet Take 1 tablet by mouth daily. 09/10/14  Yes Historical Provider, MD  valsartan (DIOVAN) 160 MG  tablet Take 160 mg by mouth every morning.    Yes Historical Provider, MD  warfarin (COUMADIN) 5 MG tablet Take 5-7.5 mg by mouth. 5 daily, wed & fri- 7.5mg  12/09/15  Yes Historical Provider, MD  diazepam (VALIUM) 2 MG tablet Take 1 tablet (2 mg total) by mouth every 8 (eight) hours as needed (vertigo). 01/27/16   Waynetta Pean, PA-C  fluticasone (FLONASE) 50 MCG/ACT nasal spray Place 2 sprays into both nostrils daily. 01/27/16   Waynetta Pean, PA-C  ONE TOUCH ULTRA TEST test strip  11/11/15   Historical Provider, MD  polyethylene glycol (MIRALAX / GLYCOLAX) packet Take 17 g by mouth 2 (two) times daily. Patient not taking: Reported on 01/27/2016 10/23/14   Eugenie Filler, MD  senna-docusate (SENOKOT-S)  8.6-50 MG per tablet Take 1 tablet by mouth at bedtime. Patient not taking: Reported on 01/07/2016 10/23/14   Eugenie Filler, MD  Tiotropium Bromide-Olodaterol (STIOLTO RESPIMAT) 2.5-2.5 MCG/ACT AERS Inhale 2 puffs into the lungs daily. Patient not taking: Reported on 01/07/2016 04/19/14   Deneise Lever, MD    Family History Family History  Problem Relation Age of Onset  . Tuberculosis Maternal Grandmother   . Heart attack Neg Hx   . Diabetes Neg Hx   . CAD Neg Hx   . Cancer Neg Hx     Social History Social History  Substance Use Topics  . Smoking status: Former Smoker    Packs/day: 2.00    Years: 25.00    Types: Cigarettes    Quit date: 07/08/1976  . Smokeless tobacco: Never Used  . Alcohol use No     Comment: in AA since 1977, no alcohol since 1977     Allergies   Penicillins; Antihistamines, loratadine-type; Meclizine; and Other   Review of Systems Review of Systems  Constitutional: Negative for chills and fever.  HENT: Positive for congestion, postnasal drip and rhinorrhea. Negative for nosebleeds and sore throat.   Eyes: Negative for pain and visual disturbance.  Respiratory: Positive for cough. Negative for shortness of breath and wheezing.   Cardiovascular:  Negative for chest pain.  Gastrointestinal: Positive for nausea. Negative for abdominal pain, blood in stool and vomiting.  Genitourinary: Negative for dysuria.  Musculoskeletal: Negative for back pain and neck pain.  Skin: Negative for rash.  Neurological: Positive for dizziness. Negative for syncope, weakness, light-headedness, numbness and headaches.     Physical Exam Updated Vital Signs BP (!) 121/41   Pulse 77   Temp 97.7 F (36.5 C) (Oral)   Resp 19   Ht 6' (1.829 m)   Wt 104.3 kg   SpO2 98%   BMI 31.19 kg/m   Physical Exam  Constitutional: He is oriented to person, place, and time. He appears well-developed and well-nourished. No distress.  Nontoxic appearing.  HENT:  Head: Normocephalic and atraumatic.  Right Ear: External ear normal.  Left Ear: External ear normal.  Mouth/Throat: Oropharynx is clear and moist.  Bilateral tympanic membranes are pearly-gray without erythema or loss of landmarks.   Eyes: Conjunctivae and EOM are normal. Pupils are equal, round, and reactive to light. Right eye exhibits no discharge. Left eye exhibits no discharge.  Neck: Normal range of motion. Neck supple. No JVD present. No tracheal deviation present.  Cardiovascular: Normal rate, normal heart sounds and intact distal pulses.  Exam reveals no gallop and no friction rub.   No murmur heard. A fib on the monitor. Heart rate of 88.  Pulmonary/Chest: Effort normal and breath sounds normal. No stridor. No respiratory distress. He has no rales.  Diminished lung sounds bilateral bases with slight wheezing noted bilaterally. No increased work of breathing. No rales or rhonchi.  Abdominal: Soft. There is no tenderness. There is no guarding.  Abdomen is soft nontender palpation.  Musculoskeletal: He exhibits no edema or tenderness.  No lower extremity edema or tenderness. Good strength to his bilateral upper and lower extremities.  Lymphadenopathy:    He has no cervical adenopathy.    Neurological: He is alert and oriented to person, place, and time. No cranial nerve deficit. Coordination normal.  Patient is alert and oriented 3. Cranial nerves are intact. Speech is clear and coherent. No pronator drift. Finger-to-nose is intact bilaterally. Sensation is intact his bilateral upper and lower  extremity is EOMs are intact.  Skin: Skin is warm and dry. Capillary refill takes less than 2 seconds. No rash noted. He is not diaphoretic. No erythema. No pallor.  Psychiatric: He has a normal mood and affect. His behavior is normal.  Nursing note and vitals reviewed.    ED Treatments / Results  Labs (all labs ordered are listed, but only abnormal results are displayed) Labs Reviewed  BASIC METABOLIC PANEL - Abnormal; Notable for the following:       Result Value   Chloride 112 (*)    Glucose, Bld 202 (*)    GFR calc non Af Amer 58 (*)    Anion gap 4 (*)    All other components within normal limits  CBC - Abnormal; Notable for the following:    RBC 3.31 (*)    Hemoglobin 8.1 (*)    HCT 27.3 (*)    MCH 24.5 (*)    MCHC 29.7 (*)    RDW 16.4 (*)    All other components within normal limits  PROTIME-INR - Abnormal; Notable for the following:    Prothrombin Time 27.2 (*)    All other components within normal limits  URINALYSIS, ROUTINE W REFLEX MICROSCOPIC - Abnormal; Notable for the following:    APPearance CLOUDY (*)    Ketones, ur 5 (*)    All other components within normal limits  CBG MONITORING, ED - Abnormal; Notable for the following:    Glucose-Capillary 186 (*)    All other components within normal limits    EKG  EKG Interpretation  Date/Time:  Tuesday January 27 2016 15:52:59 EST Ventricular Rate:  78 PR Interval:    QRS Duration: 107 QT Interval:  432 QTC Calculation: 493 R Axis:   59 Text Interpretation:  Atrial fibrillation Borderline repolarization abnormality Borderline prolonged QT interval No significant change since last tracing Confirmed by  Zenia Resides  MD, ANTHONY ([id number]) on 01/27/2016 5:31:39 PM       Radiology Dg Chest 2 View  Result Date: 01/27/2016 CLINICAL DATA:  Cough for 3 days EXAM: CHEST  2 VIEW COMPARISON:  October 14, 2015 FINDINGS: There is no edema or consolidation. The heart is upper normal in size with pulmonary vascularity within normal limits. There is atherosclerotic calcification in the aorta. No adenopathy. Patient is status post median sternotomy. There is degenerative change in the thoracic spine. There is evidence of old trauma involving the lateral right clavicle. IMPRESSION: No edema or consolidation.  Aortic atherosclerosis. Electronically Signed   By: Lowella Grip III M.D.   On: 01/27/2016 16:29   Ct Head Wo Contrast  Result Date: 01/27/2016 CLINICAL DATA:  Dizziness EXAM: CT HEAD WITHOUT CONTRAST TECHNIQUE: Contiguous axial images were obtained from the base of the skull through the vertex without intravenous contrast. COMPARISON:  None. FINDINGS: Brain: No evidence of acute infarction, hemorrhage, hydrocephalus, extra-axial collection or mass lesion/mass effect. Vascular: No hyperdense vessel or unexpected calcification. Skull: No osseous abnormality. Sinuses/Orbits: Visualized paranasal sinuses are clear. Visualized mastoid sinuses are clear. Visualized orbits demonstrate no focal abnormality. Other: None IMPRESSION: No acute intracranial pathology. Electronically Signed   By: Kathreen Devoid   On: 01/27/2016 16:48    Procedures Procedures (including critical care time)  Medications Ordered in ED Medications  sodium chloride 0.9 % bolus 500 mL (0 mLs Intravenous Stopped 01/27/16 1713)  promethazine (PHENERGAN) tablet 12.5 mg (25 mg Oral Given 01/27/16 1605)  diazepam (VALIUM) tablet 2 mg (2 mg Oral Given 01/27/16 1809)  Initial Impression / Assessment and Plan / ED Course  I have reviewed the triage vital signs and the nursing notes.  Pertinent labs & imaging results that were available  during my care of the patient were reviewed by me and considered in my medical decision making (see chart for details).  Clinical Course    This is a 82 y.o. Male who presents to the ED via EMS with his daughter complaining of vertigo since last night. Patient reports he got out of bed last night and noticed room spinning dizziness and had one episode of diarrhea on the way to the bathroom. He reports his feelings of vertigo have persisted into today. He reports his symptoms are worse with movement of his head or standing. He reports receiving Zofran by EMS and his symptoms are starting to improve. He reports a history of vertigo several years ago and took meclizine. He reports the meclizine made him very depressed and he does not want to take any meclizine. He denies falling or hitting his head. He denies any headache or any complaints of pain. He does report he's been having an increasing cough for the past 3 days. Nonproductive cough. His history of COPD. He reports associated postnasal drip, sneezing and nasal congestion. He denies any shortness of breath or chest pain. He is currently on Coumadin for atrial fibrillation. He denies fevers, or weakness.  On exam the patient is afebrile nontoxic appearing. He has no focal neurological deficits on exam.  Slightly diminished lung sounds to bilateral bases. CBC is remarkable for hemoglobin of 8.1. This is around his baseline. BMP is unremarkable. INR is therapeutic at 2.47. Urinalysis is without signs of infection. CT head without contrast is unremarkable. Chest x-ray is unremarkable. Patient was able to receive 2 more grams of Valium and a reevaluation patient reports his room spinning dizziness has completely resolved. He has eaten a [place] sandwich without nausea or vomiting. He ambulates in the room without feeling dizzy. Will discharge with a short course of Valium and have him follow-up closely with his primary care doctor. The patient I discussed  return precautions. I advised the patient to follow-up with their primary care provider this week. I advised the patient to return to the emergency department with new or worsening symptoms or new concerns. The patient and his daughter verbalized understanding and agreement with plan.    30 minutes after the patient was discharged he was still in the room. He reports he began having some more room spinning dizziness. He is provided with meclizine and reevaluation patient is still having room spinning dizziness. He tells me is not felt comfortable going home. Will admit for intractable vertigo.  This patient was discussed with and evaluated by Dr. Zenia Resides who agrees with assessment and plan.   Final Clinical Impressions(s) / ED Diagnoses   Final diagnoses:  Vertigo  Viral upper respiratory tract infection    New Prescriptions New Prescriptions   DIAZEPAM (VALIUM) 2 MG TABLET    Take 1 tablet (2 mg total) by mouth every 8 (eight) hours as needed (vertigo).   FLUTICASONE (FLONASE) 50 MCG/ACT NASAL SPRAY    Place 2 sprays into both nostrils daily.     Waynetta Pean, PA-C 01/27/16 [place], PA-C 01/27/16 2218

## 2016-01-27 NOTE — ED Notes (Signed)
Provided repeat EKG to Dr. Ascencion Dike due to the last EKG was not a precise picture of the heart rhythm.

## 2016-01-28 DIAGNOSIS — E119 Type 2 diabetes mellitus without complications: Secondary | ICD-10-CM | POA: Diagnosis not present

## 2016-01-28 DIAGNOSIS — H8110 Benign paroxysmal vertigo, unspecified ear: Secondary | ICD-10-CM

## 2016-01-28 DIAGNOSIS — I482 Chronic atrial fibrillation: Secondary | ICD-10-CM

## 2016-01-28 LAB — GLUCOSE, CAPILLARY
Glucose-Capillary: 196 mg/dL — ABNORMAL HIGH (ref 65–99)
Glucose-Capillary: 145 mg/dL — ABNORMAL HIGH (ref 65–99)

## 2016-01-28 LAB — PROTIME-INR
INR: 2.59
PROTHROMBIN TIME: 28.3 s — AB (ref 11.4–15.2)

## 2016-01-28 MED ORDER — WARFARIN SODIUM 5 MG PO TABS: 7.5000 mg | ORAL_TABLET | Freq: Once | ORAL | Status: DC

## 2016-01-28 MED ORDER — MECLIZINE HCL 25 MG PO TABS: 25.0000 mg | ORAL_TABLET | Freq: Three times a day (TID) | ORAL | 0 refills | Status: AC | PRN

## 2016-01-28 NOTE — Progress Notes (Signed)
This CM met with pt at bedside to offer choice for HHPT. Pt states he has done vestibular therapy with [facility] in the past and would like to use them again. [facility] rep contacted for referral. No other CM needs communicated. Marney Doctor RN,BSN,NCM [phone]

## 2016-01-28 NOTE — Discharge Summary (Addendum)
Physician Discharge [place] [id number] DOB: [birth year] DOA: 01/27/2016  PCP: Wenda Low, MD  Admit date: 01/27/2016 Discharge date: 01/28/2016  Admitted From: Home Disposition:  Home   Recommendations for Outpatient Follow-up:  1. Follow up with PCP in 1-2 weeks 2. Get set up with Home Health PT vestibular 3. Continue routine INR monitoring 4. Get CBC in 1 week  Home Health: Yes- PT Equipment/Devices: None   Discharge Condition:Stable CODE STATUS: Full code Diet recommendation: Carb Modified    Brief/Interim Summary: Joel [name] is a 82 y.o. male with medical history significant of A.Fib, DM, HTN.  Patient presents to ED with c/o vertigo since last night. Patient reports he got out of bed last night and noticed room spinning dizziness and had one episode of diarrhea on the way to the bathroom. He reports his feelings of vertigo have persisted into today. He reports his symptoms are worse with movement of his head or standing. He reports receiving Zofran by EMS and his symptoms are starting to improve. He reports a history of vertigo several years ago and took meclizine. He reports the meclizine made him very depressed and he does not want to take any meclizine. He denies falling or hitting his head. He denies any headache or any complaints of pain. He does report he's been having an increasing cough for the past 3 days. Nonproductive cough. His history of COPD. He reports associated postnasal drip, sneezing and nasal congestion. He denies any shortness of breath or chest pain. He is currently on Coumadin for atrial fibrillation. He reports he's been compliant with his medication but failed to take his Coumadin this morning. Patient denies fevers, shortness of breath, chest pain, numbness, tingling, weakness, headache, neck pain, back pain, abdominal pain, vomiting, or rashes.  MRI head negative.  Patient dizziness improved overnight and he was seen and evaluated by  PT on 12/27 who recommended home health PT.    Discharge Diagnoses:  Principal Problem:   BPPV (benign paroxysmal positional vertigo) Active Problems:   DM2 (diabetes mellitus, type 2) (HCC)   Atrial fibrillation (HCC)   Hypertension  BPPV - patient reports almost complete resolution of symptoms - agreeable to [facility] with PT - has meclizine at home that he uses sparingly - understands to return to ED with any new or worsening symptoms   Discharge Instructions  Discharge Instructions    Call MD for:  difficulty breathing, headache or visual disturbances    Complete by:  As directed    Call MD for:  extreme fatigue    Complete by:  As directed    Call MD for:  persistant dizziness or light-headedness    Complete by:  As directed    Call MD for:  persistant nausea and vomiting    Complete by:  As directed    Call MD for:  severe uncontrolled pain    Complete by:  As directed    Call MD for:  temperature >100.4    Complete by:  As directed    Diet - low sodium heart healthy    Complete by:  As directed    Discharge instructions    Complete by:  As directed    Please return with any increase in dizziness or any falls.   Increase activity slowly    Complete by:  As directed      Allergies as of 01/28/2016      Reactions   Penicillins Anaphylaxis   Has patient had a  PCN reaction causing immediate rash, facial/tongue/throat swelling, SOB or lightheadedness with hypotension: unknown Has patient had a PCN reaction causing severe rash involving mucus membranes or skin necrosis: unknown Has patient had a PCN reaction that required hospitalization: unknown Has patient had a PCN reaction occurring within the last 10 years: no If all of the above answers are "NO", then may proceed with Cephalosporin use.   Antihistamines, Loratadine-type    depression   Meclizine    depression   Other    Beta blockers-Novacaine depression      Medication List    TAKE these medications    allopurinol 100 MG tablet Commonly known as:  ZYLOPRIM Take 100 mg by mouth daily.   buPROPion 150 MG 24 hr tablet Commonly known as:  WELLBUTRIN XL Take 1 tablet (150 mg total) by mouth daily.   dextromethorphan-guaiFENesin 30-600 MG 12hr tablet Commonly known as:  MUCINEX DM Take 1 tablet by mouth 2 (two) times daily.   diltiazem 120 MG 24 hr capsule Commonly known as:  DILACOR XR Take 120 mg by mouth at bedtime.   fluticasone 50 MCG/ACT nasal spray Commonly known as:  FLONASE Place 2 sprays into both nostrils daily.   furosemide 80 MG tablet Commonly known as:  LASIX Take 80 mg by mouth daily.   iron polysaccharides 150 MG capsule Commonly known as:  NIFEREX Take 1 capsule (150 mg total) by mouth 2 (two) times daily.   JANUVIA 100 MG tablet Generic drug:  sitaGLIPtin Take 1 tablet by mouth daily.   lovastatin 20 MG tablet Commonly known as:  MEVACOR Take 20 mg by mouth daily at 6 PM.   meclizine 25 MG tablet Commonly known as:  ANTIVERT Take 1 tablet (25 mg total) by mouth 3 (three) times daily as needed for dizziness.   metFORMIN 1000 MG tablet Commonly known as:  GLUCOPHAGE Take 1,000 mg by mouth 2 (two) times daily.   ONE TOUCH ULTRA TEST test strip Generic drug:  glucose blood   OVER THE COUNTER MEDICATION Take 1 tablet by mouth 2 (two) times daily. OTC Vision supplement   potassium chloride SA 20 MEQ tablet Commonly known as:  K-DUR,KLOR-CON Take 1 tablet by mouth daily.   valsartan 160 MG tablet Commonly known as:  DIOVAN Take 160 mg by mouth every morning.   warfarin 5 MG tablet Commonly known as:  COUMADIN Take 5-7.5 mg by mouth. 5 daily, wed & fri- 7.5mg       Follow-up Information    [name],[name], MD. Schedule an appointment as soon as possible for a visit in 2 day(s).   Specialty:  Internal Medicine Why:  Make an appoinment for follow up with your doctor Get CBC in 1 week Contact information: [address] [place] [address] [place] DEPT Follow up.   Specialty:  Emergency Medicine Why:  Please return to the ER with new or worsening symptoms or new concerns. Contact information: [place] [id number] [place] [phone]         Allergies  Allergen Reactions  . Penicillins Anaphylaxis    Has patient had a PCN reaction causing immediate rash, facial/tongue/throat swelling, SOB or lightheadedness with hypotension: unknown Has patient had a PCN reaction causing severe rash involving mucus membranes or skin necrosis: unknown Has patient had a PCN reaction that required hospitalization: unknown Has patient had a PCN reaction occurring within the last 10  years: no If all of the above answers are "NO", then may proceed with Cephalosporin use.   Marland Kitchen Antihistamines, Loratadine-Type     depression  . Meclizine     depression  . Other     Beta blockers-Novacaine depression    Consultations:  PT   Procedures/Studies: Dg Chest 2 View  Result Date: 01/27/2016 CLINICAL DATA:  Cough for 3 days EXAM: CHEST  2 VIEW COMPARISON:  October 14, 2015 FINDINGS: There is no edema or consolidation. The heart is upper normal in size with pulmonary vascularity within normal limits. There is atherosclerotic calcification in the aorta. No adenopathy. Patient is status post median sternotomy. There is degenerative change in the thoracic spine. There is evidence of old trauma involving the lateral right clavicle. IMPRESSION: No edema or consolidation.  Aortic atherosclerosis. Electronically Signed   By: Lowella Grip III M.D.   On: 01/27/2016 16:29   Ct Head Wo Contrast  Result Date: 01/27/2016 CLINICAL DATA:  Dizziness EXAM: CT HEAD WITHOUT CONTRAST TECHNIQUE: Contiguous axial images were obtained from the base of the skull through the vertex without intravenous contrast. COMPARISON:  None. FINDINGS:  Brain: No evidence of acute infarction, hemorrhage, hydrocephalus, extra-axial collection or mass lesion/mass effect. Vascular: No hyperdense vessel or unexpected calcification. Skull: No osseous abnormality. Sinuses/Orbits: Visualized paranasal sinuses are clear. Visualized mastoid sinuses are clear. Visualized orbits demonstrate no focal abnormality. Other: None IMPRESSION: No acute intracranial pathology. Electronically Signed   By: Kathreen Devoid   On: 01/27/2016 16:48   Mr Brain Wo Contrast  Result Date: 01/27/2016 CLINICAL DATA:  Vertigo EXAM: MRI HEAD WITHOUT CONTRAST TECHNIQUE: Multiplanar, multiecho pulse sequences of the brain and surrounding structures were obtained without intravenous contrast. COMPARISON:  Head CT 01/27/2016 Brain MRI 11/07/2009 FINDINGS: Brain: No focal diffusion restriction to indicate acute infarct. No intraparenchymal hemorrhage. There is mild multifocal hyperintense T2-weighted signal within the periventricular white matter, most often seen in the setting of chronic microvascular ischemia. No mass lesion or midline shift. No hydrocephalus or extra-axial fluid collection. The midline structures are normal. No age advanced or lobar predominant atrophy. Vascular: Major intracranial arterial and venous sinus flow voids are preserved. No evidence of chronic microhemorrhage or amyloid angiopathy. Skull and upper cervical spine: The visualized skull base, calvarium, upper cervical spine and extracranial soft tissues are normal. Sinuses/Orbits: No fluid levels or advanced mucosal thickening. No mastoid effusion. Normal orbits. IMPRESSION: Mild chronic microvascular disease. No acute intracranial abnormality. Electronically Signed   By: Ulyses Jarred M.D.   On: 01/27/2016 22:58      Subjective: Patient doing well this morning.  States his dizziness is vastly improved.  Feels stable to go home.  Believes his dizziness may be attributed to Lupron injection he received 2 weeks  ago.  Discharge Exam: Vitals:   01/27/16 2355 01/28/16 0529  BP: (!) 103/53 (!) 143/75  Pulse: 79 64  Resp: 18 18  Temp: 97.7 F (36.5 C) 98 F (36.7 C)   Vitals:   01/27/16 1930 01/27/16 2311 01/27/16 2355 01/28/16 0529  BP: (!) 116/40 (!) 119/49 (!) 103/53 (!) 143/75  Pulse: 75 72 79 64  Resp: 19 18 18 18   Temp:   97.7 F (36.5 C) 98 F (36.7 C)  TempSrc:   Oral Axillary  SpO2: 99% 98% 96% 97%  Weight:   105.5 kg (232 lb 9.4 oz)   Height:   6' (1.829 m)     General: Pt is alert, awake, not in acute distress Cardiovascular:  RRR, S1/S2 +, no rubs, no gallops Respiratory: CTA bilaterally, no wheezing, no rhonchi Abdominal: Soft, NT, ND, bowel sounds + Extremities: no edema, no cyanosis    The results of significant diagnostics from this hospitalization (including imaging, microbiology, ancillary and laboratory) are listed below for reference.     Microbiology: No results found for this or any previous visit (from the past 240 hour(s)).   Labs: BNP (last 3 results)  Recent Labs  06/28/15 2227  BNP 0000000   Basic Metabolic Panel:  Recent Labs Lab 01/27/16 1454  NA 141  K 4.4  CL 112*  CO2 25  GLUCOSE 202*  BUN 18  CREATININE 1.14  CALCIUM 8.9   Liver Function Tests: No results for input(s): AST, ALT, ALKPHOS, BILITOT, PROT, ALBUMIN in the last 168 hours. No results for input(s): LIPASE, AMYLASE in the last 168 hours. No results for input(s): AMMONIA in the last 168 hours. CBC:  Recent Labs Lab 01/27/16 1454  WBC 5.3  HGB 8.1*  HCT 27.3*  MCV 82.5  PLT 209   Cardiac Enzymes: No results for input(s): CKTOTAL, CKMB, CKMBINDEX, TROPONINI in the last 168 hours. BNP: Invalid input(s): POCBNP CBG:  Recent Labs Lab 01/27/16 1503 01/28/16 0012 01/28/16 0905  GLUCAP 186* 145* 196*   D-Dimer No results for input(s): DDIMER in the last 72 hours. Hgb A1c No results for input(s): HGBA1C in the last 72 hours. Lipid Profile No results for  input(s): CHOL, HDL, LDLCALC, TRIG, CHOLHDL, LDLDIRECT in the last 72 hours. Thyroid function studies No results for input(s): TSH, T4TOTAL, T3FREE, THYROIDAB in the last 72 hours.  Invalid input(s): FREET3 Anemia work up No results for input(s): VITAMINB12, FOLATE, FERRITIN, TIBC, IRON, RETICCTPCT in the last 72 hours. Urinalysis    Component Value Date/Time   COLORURINE YELLOW 01/27/2016 1730   APPEARANCEUR CLOUDY (A) 01/27/2016 1730   LABSPEC 1.019 01/27/2016 1730   PHURINE 5.0 01/27/2016 1730   GLUCOSEU NEGATIVE 01/27/2016 1730   HGBUR NEGATIVE 01/27/2016 1730   BILIRUBINUR NEGATIVE 01/27/2016 1730   KETONESUR 5 (A) 01/27/2016 1730   PROTEINUR NEGATIVE 01/27/2016 1730   UROBILINOGEN 0.2 10/22/2014 2136   NITRITE NEGATIVE 01/27/2016 1730   LEUKOCYTESUR NEGATIVE 01/27/2016 1730   Sepsis Labs Invalid input(s): PROCALCITONIN,  WBC,  LACTICIDVEN Microbiology No results found for this or any previous visit (from the past 240 hour(s)).   Time coordinating discharge: Less than 30 minutes  SIGNED:   Loretha Stapler, MD  [facility] 01/28/2016, 3:50 PM Pager [phone] If 7PM-7AM, please contact night-coverage [email or web address] Password [email or web address]

## 2016-01-28 NOTE — Progress Notes (Signed)
[place] for Warfarin Indication: atrial fibrillation  Allergies  Allergen Reactions  . Penicillins Anaphylaxis    Has patient had a PCN reaction causing immediate rash, facial/tongue/throat swelling, SOB or lightheadedness with hypotension: unknown Has patient had a PCN reaction causing severe rash involving mucus membranes or skin necrosis: unknown Has patient had a PCN reaction that required hospitalization: unknown Has patient had a PCN reaction occurring within the last 10 years: no If all of the above answers are "NO", then may proceed with Cephalosporin use.   Marland Kitchen Antihistamines, Loratadine-Type     depression  . Meclizine     depression  . Other     Beta blockers-Novacaine depression    Patient Measurements: Height: 6' (182.9 cm) Weight: 232 lb 9.4 oz (105.5 kg) IBW/kg (Calculated) : 77.6  Vital Signs: Temp: 98 F (36.7 C) (12/27 0529) Temp Source: Axillary (12/27 0529) BP: 143/75 (12/27 0529) Pulse Rate: 64 (12/27 0529)  Labs:  Recent Labs  01/27/16 1454 01/27/16 1458 01/28/16 0424  HGB 8.1*  --   --   HCT 27.3*  --   --   PLT 209  --   --   LABPROT  --  27.2* 28.3*  INR  --  2.47 2.59  CREATININE 1.14  --   --     Estimated Creatinine Clearance: 63.8 mL/min (by C-G formula based on SCr of 1.14 mg/dL).   Medical History: Past Medical History:  Diagnosis Date  . Allergic rhinitis   . Anemia   . Anxiety   . Aortic stenosis 2012   mild to mod   . Atrial fibrillation ([place]) 1991   with multiple DCCV  . Bipolar affective disorder ([place])   . COPD (chronic obstructive pulmonary disease) ([place])   . Diabetic neuropathy ([place])    in feet  . Dyslipidemia   . Fatigue    last year or so  . Gout   . Hypertension   . Insomnia   . Obesity   . On home oxygen therapy 2 1/2 liters with bipap at night  . OSA (obstructive sleep apnea)    severe, uses bipap 16 1/2 by 12 or 13 setting  . Prostate cancer ([place])   . Rectal  bleeding 12/16/2015  . Type 2 diabetes mellitus ([place])   . Vertigo     Assessment: 82 yr male presents to ED with c/o vertigo.  PMH significant for AFib, which patient takes warfarin.  Pharmacy consulted to dose warfarin. PTA warfarin regimen:  5mg  daily except 7.5mg  on Wed & Fri  Today, 01/28/2016  INR therapeutic at 2.59  Hgb yesterday low at 8.1, previous values range from 8.3-10.5 in Nov 2017  Plts wnl, no bleeding documented  Regular diet ordered  No significant drug interactions noted  Goal of Therapy:  INR 2-3   Plan:   Warfarin 7.5mg  po x 1 dose tonight as per home regimen  Check daily INR  Peggyann Juba, PharmD, BCPS Pager: [phone] 01/28/2016,8:39 AM

## 2016-01-28 NOTE — Evaluation (Signed)
Physical Therapy Evaluation Patient Details Name: Joel [name] MRN: [id number] DOB: Oct 31, 1932 Today's Date: 01/28/2016   History of Present Illness  82 y.o. male with medical history significant of A.Fib, DM, HTN, COPD, OSA on nighttime oxygen.  Patient presents to ED with c/o vertigo since last night. Patient reports he got out of bed last night and noticed room spinning dizziness and had one episode of diarrhea on the way to the bathroom.    Clinical Impression  Pt admitted with above diagnosis. Pt currently with functional limitations due to the deficits listed below (see PT Problem List). Pt will benefit from skilled PT to increase their independence and safety with mobility to allow discharge to the venue listed below.  Pt reports recent upper respiratory infection as well as received an injection from MD (unable to recall name) before getting ready to start radiation in January for prostate cancer.  Pt states he was told this injection may cause dizziness?  Pt states he had spinning sensation which did not cease yesterday however today reports no spinning and only symptom of lightheadedness/dizziness with positional testing to right side.  Pt without any dizziness at rest as well as upon sitting however was dizzy with standing.  Pt denies tinnitus and states his hearing seems about normal (HOH at baseline, wears hearing aides).  Oculomotor exam without significant findings, pt only with difficulty performing VOR (unable to relax for thrust) however no nystagmus observed throughout exam.  Pt also without nystagmus for [facility] and horizontal testing however pt reports symptom of dizziness with right sided testing.  No symptoms with eye movement to right side or turning head to right when siting upright.  Pt reports "vertigo" diagnosis years ago and saw ENT who recommended neurologist however pt states he did not feel good rapport with neurologist and did not go back.  Difficulty with determining  if gait at baseline as pt reports poor knees (receives injections) and requiring UE support with canes and furniture walking (also hx of peripheral neuropathy per chart).  Pt would best benefit from f/u with outpatient vestibular PT however may not be able to transport so recommending initial HHPT at this time.  Vitals end of ambulation: BP: 158/70 mmHg, 100% room air SpO2, HR: 72 bpm     Follow Up Recommendations Home health PT;Supervision for mobility/OOB (PT familiar with vestibular would be best)    Equipment Recommendations  None recommended by PT    Recommendations for Other Services       Precautions / Restrictions Precautions Precautions: Fall      Mobility  Bed Mobility Overal bed mobility: Modified Independent                Transfers Overall transfer level: Needs assistance   Transfers: Sit to/from Stand Sit to Stand: Min guard         General transfer comment: min/guard for safety, requires use of UEs to self assist  Ambulation/Gait Ambulation/Gait assistance: Min guard Ambulation Distance (Feet): 100 Feet Assistive device: 1 person hand held assist Gait Pattern/deviations: Step-through pattern;Decreased stride length Gait velocity: decr   General Gait Details: pt with unsteadiness and reports mild dizziness (no spinning), pt reports he has "bad knees", typically uses canes and furniture walks at home (no room for RW per pt), utilized hand rail today as well as 1 HHA, min/guard for safety  Stairs            Wheelchair Mobility    Modified Rankin (Stroke Patients Only)  Balance                                             Pertinent Vitals/Pain Pain Assessment: No/denies pain    Home Living Family/patient expects to be discharged to:: Private residence Living Arrangements: Alone Available Help at Discharge: Family Type of Home: House       Home Layout: Multi-level;Able to live on main level with  bedroom/bathroom Home Equipment: Gilford Rile - 2 wheels;Cane - single point      Prior Function Level of Independence: Independent with assistive device(s)         Comments: pt reports he uses canes sometimes (he has them 'everywhere") and also furniture walks     Hand Dominance        Extremity/Trunk Assessment        Lower Extremity Assessment Lower Extremity Assessment: LLE deficits/detail;RLE deficits/detail RLE Deficits / Details: reports "bad knees", has been receiving injections from ortho MD RLE Sensation: history of peripheral neuropathy LLE Sensation: history of peripheral neuropathy       Communication   Communication: HOH (wears hearing aides, does not have with him)  Cognition Arousal/Alertness: Awake/alert Behavior During Therapy: WFL for tasks assessed/performed Overall Cognitive Status: Within Functional Limits for tasks assessed                      General Comments      Exercises     Assessment/Plan    PT Assessment Patient needs continued PT services  PT Problem List Decreased activity tolerance;Decreased balance;Decreased mobility;Decreased knowledge of use of DME          PT Treatment Interventions DME instruction;Gait training;Therapeutic exercise;Balance training;Functional mobility training;Therapeutic activities;Patient/family education    PT Goals (Current goals can be found in the Care Plan section)  Acute Rehab PT Goals PT Goal Formulation: With patient Time For Goal Achievement: 02/04/16 Potential to Achieve Goals: Good    Frequency Min 3X/week   Barriers to discharge        Co-evaluation               End of Session Equipment Utilized During Treatment: Gait belt Activity Tolerance: Patient tolerated treatment well Patient left: in bed;with call bell/phone within reach;with bed alarm set      Functional Assessment Tool Used: clinical judgement Functional Limitation: Mobility: Walking and moving  around Mobility: Walking and Moving Around Current Status [id number]): At least 20 percent but less than 40 percent impaired, limited or restricted Mobility: Walking and Moving Around Goal Status [phone]): At least 1 percent but less than 20 percent impaired, limited or restricted    Time: [id number] PT Time Calculation (min) (ACUTE ONLY): 33 min   Charges:   PT Evaluation $PT Eval Moderate Complexity: 1 Procedure PT Treatments $Gait Training: 8-22 mins   PT G Codes:   PT G-Codes **NOT FOR INPATIENT CLASS** Functional Assessment Tool Used: clinical judgement Functional Limitation: Mobility: Walking and moving around Mobility: Walking and Moving Around Current Status [id number]): At least 20 percent but less than 40 percent impaired, limited or restricted Mobility: Walking and Moving Around Goal Status [phone]): At least 1 percent but less than 20 percent impaired, limited or restricted    Leann Mayweather,KATHrine E 01/28/2016, 11:49 AM Carmelia Bake, PT, DPT 01/28/2016 Pager: [phone]

## 2016-01-28 NOTE — Progress Notes (Signed)
Pt discharged home in stable condition. Discharge instructions given. Pt verbalized understanding. No immediate questions or concerns. 

## 2016-02-10 ENCOUNTER — Encounter (HOSPITAL_COMMUNITY): Payer: PPO

## 2016-02-10 DIAGNOSIS — C61 Malignant neoplasm of prostate: Secondary | ICD-10-CM | POA: Diagnosis not present

## 2016-02-11 DIAGNOSIS — F419 Anxiety disorder, unspecified: Secondary | ICD-10-CM | POA: Diagnosis not present

## 2016-02-11 DIAGNOSIS — F3341 Major depressive disorder, recurrent, in partial remission: Secondary | ICD-10-CM | POA: Diagnosis not present

## 2016-02-11 DIAGNOSIS — Z7901 Long term (current) use of anticoagulants: Secondary | ICD-10-CM | POA: Diagnosis not present

## 2016-02-11 DIAGNOSIS — C61 Malignant neoplasm of prostate: Secondary | ICD-10-CM | POA: Diagnosis not present

## 2016-02-11 DIAGNOSIS — I482 Chronic atrial fibrillation: Secondary | ICD-10-CM | POA: Diagnosis not present

## 2016-02-11 DIAGNOSIS — I509 Heart failure, unspecified: Secondary | ICD-10-CM | POA: Diagnosis not present

## 2016-02-11 DIAGNOSIS — M109 Gout, unspecified: Secondary | ICD-10-CM | POA: Diagnosis not present

## 2016-02-11 DIAGNOSIS — I739 Peripheral vascular disease, unspecified: Secondary | ICD-10-CM | POA: Diagnosis not present

## 2016-02-11 DIAGNOSIS — E1142 Type 2 diabetes mellitus with diabetic polyneuropathy: Secondary | ICD-10-CM | POA: Diagnosis not present

## 2016-02-11 DIAGNOSIS — E782 Mixed hyperlipidemia: Secondary | ICD-10-CM | POA: Diagnosis not present

## 2016-02-11 DIAGNOSIS — I10 Essential (primary) hypertension: Secondary | ICD-10-CM | POA: Diagnosis not present

## 2016-02-11 DIAGNOSIS — N183 Chronic kidney disease, stage 3 (moderate): Secondary | ICD-10-CM | POA: Diagnosis not present

## 2016-02-13 ENCOUNTER — Encounter (HOSPITAL_COMMUNITY)
Admission: RE | Admit: 2016-02-13 | Discharge: 2016-02-13 | Disposition: A | Discharge status: Home / Self Care | Payer: PPO | Source: Ambulatory Visit | Attending: Urology | Admitting: Urology

## 2016-02-13 DIAGNOSIS — R972 Elevated prostate specific antigen [PSA]: Secondary | ICD-10-CM | POA: Diagnosis not present

## 2016-02-13 DIAGNOSIS — C61 Malignant neoplasm of prostate: Secondary | ICD-10-CM | POA: Diagnosis not present

## 2016-02-13 MED ORDER — TECHNETIUM TC 99M MEDRONATE IV KIT
20.9000 | PACK | Freq: Once | INTRAVENOUS | Status: AC | PRN
  Administered 2016-02-13: 20.9 via INTRAVENOUS

## 2016-02-16 DIAGNOSIS — E662 Morbid (severe) obesity with alveolar hypoventilation: Secondary | ICD-10-CM | POA: Diagnosis not present

## 2016-02-16 DIAGNOSIS — G4733 Obstructive sleep apnea (adult) (pediatric): Secondary | ICD-10-CM | POA: Diagnosis not present

## 2016-02-16 DIAGNOSIS — J410 Simple chronic bronchitis: Secondary | ICD-10-CM | POA: Diagnosis not present

## 2016-02-18 ENCOUNTER — Ambulatory Visit: Payer: PPO | Admitting: Podiatry

## 2016-02-26 ENCOUNTER — Encounter (HOSPITAL_COMMUNITY): Payer: Self-pay | Admitting: Emergency Medicine

## 2016-02-26 ENCOUNTER — Inpatient Hospital Stay (HOSPITAL_COMMUNITY)
Admission: EM | Admit: 2016-02-26 | Discharge: 2016-03-04 | DRG: 378 | Disposition: A | Discharge status: Home / Self Care | Payer: PPO | Attending: Internal Medicine | Admitting: Internal Medicine

## 2016-02-26 DIAGNOSIS — Z7984 Long term (current) use of oral hypoglycemic drugs: Secondary | ICD-10-CM

## 2016-02-26 DIAGNOSIS — Z880 Allergy status to penicillin: Secondary | ICD-10-CM

## 2016-02-26 DIAGNOSIS — I482 Chronic atrial fibrillation, unspecified: Secondary | ICD-10-CM | POA: Diagnosis present

## 2016-02-26 DIAGNOSIS — Z7901 Long term (current) use of anticoagulants: Secondary | ICD-10-CM

## 2016-02-26 DIAGNOSIS — E119 Type 2 diabetes mellitus without complications: Secondary | ICD-10-CM | POA: Diagnosis not present

## 2016-02-26 DIAGNOSIS — I350 Nonrheumatic aortic (valve) stenosis: Secondary | ICD-10-CM | POA: Diagnosis present

## 2016-02-26 DIAGNOSIS — G4733 Obstructive sleep apnea (adult) (pediatric): Secondary | ICD-10-CM | POA: Diagnosis not present

## 2016-02-26 DIAGNOSIS — Z9109 Other allergy status, other than to drugs and biological substances: Secondary | ICD-10-CM | POA: Diagnosis not present

## 2016-02-26 DIAGNOSIS — K921 Melena: Secondary | ICD-10-CM | POA: Diagnosis not present

## 2016-02-26 DIAGNOSIS — I272 Pulmonary hypertension, unspecified: Secondary | ICD-10-CM | POA: Diagnosis not present

## 2016-02-26 DIAGNOSIS — D649 Anemia, unspecified: Secondary | ICD-10-CM

## 2016-02-26 DIAGNOSIS — Z9889 Other specified postprocedural states: Secondary | ICD-10-CM

## 2016-02-26 DIAGNOSIS — Z79899 Other long term (current) drug therapy: Secondary | ICD-10-CM

## 2016-02-26 DIAGNOSIS — Z66 Do not resuscitate: Secondary | ICD-10-CM | POA: Diagnosis present

## 2016-02-26 DIAGNOSIS — I10 Essential (primary) hypertension: Secondary | ICD-10-CM | POA: Diagnosis present

## 2016-02-26 DIAGNOSIS — M109 Gout, unspecified: Secondary | ICD-10-CM | POA: Diagnosis not present

## 2016-02-26 DIAGNOSIS — Z7951 Long term (current) use of inhaled steroids: Secondary | ICD-10-CM

## 2016-02-26 DIAGNOSIS — Z9981 Dependence on supplemental oxygen: Secondary | ICD-10-CM

## 2016-02-26 DIAGNOSIS — D62 Acute posthemorrhagic anemia: Secondary | ICD-10-CM | POA: Diagnosis present

## 2016-02-26 DIAGNOSIS — E669 Obesity, unspecified: Secondary | ICD-10-CM | POA: Diagnosis not present

## 2016-02-26 DIAGNOSIS — K922 Gastrointestinal hemorrhage, unspecified: Secondary | ICD-10-CM | POA: Diagnosis present

## 2016-02-26 DIAGNOSIS — J302 Other seasonal allergic rhinitis: Secondary | ICD-10-CM | POA: Diagnosis present

## 2016-02-26 DIAGNOSIS — Z8546 Personal history of malignant neoplasm of prostate: Secondary | ICD-10-CM

## 2016-02-26 DIAGNOSIS — I509 Heart failure, unspecified: Secondary | ICD-10-CM | POA: Diagnosis not present

## 2016-02-26 DIAGNOSIS — E662 Morbid (severe) obesity with alveolar hypoventilation: Secondary | ICD-10-CM | POA: Diagnosis not present

## 2016-02-26 DIAGNOSIS — J449 Chronic obstructive pulmonary disease, unspecified: Secondary | ICD-10-CM | POA: Diagnosis not present

## 2016-02-26 DIAGNOSIS — E1140 Type 2 diabetes mellitus with diabetic neuropathy, unspecified: Secondary | ICD-10-CM | POA: Diagnosis not present

## 2016-02-26 DIAGNOSIS — D509 Iron deficiency anemia, unspecified: Secondary | ICD-10-CM | POA: Diagnosis not present

## 2016-02-26 DIAGNOSIS — N182 Chronic kidney disease, stage 2 (mild): Secondary | ICD-10-CM | POA: Diagnosis not present

## 2016-02-26 DIAGNOSIS — Z87891 Personal history of nicotine dependence: Secondary | ICD-10-CM

## 2016-02-26 LAB — CBC
HEMATOCRIT: 21.4 % — AB (ref 39.0–52.0)
HEMOGLOBIN: 6.4 g/dL — AB (ref 13.0–17.0)
MCH: 25.3 pg — ABNORMAL LOW (ref 26.0–34.0)
MCHC: 29.9 g/dL — AB (ref 30.0–36.0)
MCV: 84.6 fL (ref 78.0–100.0)
PLATELETS: 206 10*3/uL (ref 150–400)
RBC: 2.53 MIL/uL — ABNORMAL LOW (ref 4.22–5.81)
RDW: 18.2 % — AB (ref 11.5–15.5)
WBC: 5.1 10*3/uL (ref 4.0–10.5)

## 2016-02-26 LAB — COMPREHENSIVE METABOLIC PANEL
ALBUMIN: 3.9 g/dL (ref 3.5–5.0)
ALK PHOS: 72 U/L (ref 38–126)
ALT: 10 U/L — ABNORMAL LOW (ref 17–63)
ANION GAP: 6 (ref 5–15)
AST: 19 U/L (ref 15–41)
BILIRUBIN TOTAL: 0.5 mg/dL (ref 0.3–1.2)
BUN: 23 mg/dL — ABNORMAL HIGH (ref 6–20)
CALCIUM: 8.9 mg/dL (ref 8.9–10.3)
CO2: 25 mmol/L (ref 22–32)
Chloride: 107 mmol/L (ref 101–111)
Creatinine, Ser: 1.46 mg/dL — ABNORMAL HIGH (ref 0.61–1.24)
GFR calc Af Amer: 50 mL/min — ABNORMAL LOW (ref >60)
GFR, EST NON AFRICAN AMERICAN: 43 mL/min — AB (ref >60)
GLUCOSE: 134 mg/dL — AB (ref 65–99)
POTASSIUM: 4.1 mmol/L (ref 3.5–5.1)
Sodium: 138 mmol/L (ref 135–145)
TOTAL PROTEIN: 7.2 g/dL (ref 6.5–8.1)

## 2016-02-26 LAB — PROTIME-INR
INR: 2.93
PROTHROMBIN TIME: 31.2 s — AB (ref 11.4–15.2)

## 2016-02-26 LAB — POC OCCULT BLOOD, ED: FECAL OCCULT BLD: POSITIVE — AB

## 2016-02-26 MED ORDER — SODIUM CHLORIDE 0.9 % IV SOLN
Freq: Once | INTRAVENOUS | Status: AC
  Administered 2016-02-26: via INTRAVENOUS

## 2016-02-26 MED ORDER — VITAMIN K1 10 MG/ML IJ SOLN
10.0000 mg | Freq: Once | INTRAVENOUS | Status: AC
  Administered 2016-02-26: 10 mg via INTRAVENOUS
  Filled 2016-02-26: qty 1

## 2016-02-26 MED ORDER — SODIUM CHLORIDE 0.9 % IV SOLN
Freq: Once | INTRAVENOUS | Status: AC
  Administered 2016-02-27: via INTRAVENOUS

## 2016-02-26 MED ORDER — PANTOPRAZOLE SODIUM 40 MG IV SOLR
40.0000 mg | Freq: Once | INTRAVENOUS | Status: AC
  Administered 2016-02-26: 40 mg via INTRAVENOUS
  Filled 2016-02-26: qty 40

## 2016-02-26 NOTE — H&P (Signed)
History and Physical    Joel [name] [id number] DOB: 1932/02/07 DOA: 02/26/2016  PCP: Wenda Low, MD   Patient coming from: Home  Chief Complaint: Anemia.  HPI: Joel [name] is a 83 y.o. male with medical history significant of allergic rhinitis, anemia, anxiety, aortic stenosis, chronic atrial fibrillation, bipolar affective disorder, COPD on home oxygen, diabetic neuropathy, hyperlipidemia, gout, hypertension, prostate cancer, type 2 diabetes who was referred by his PCP to the emergency department after the patient has been feeling progressively worse with fatigue, dyspnea, dizziness and reports melanotic stools for the past week and a half. His hemoglobin level at his PCP office today was 8.0 g/dL. He denies chest pain, abdominal pain, nausea, emesis, diarrhea or constipation. He denies frequency or dysuria.  The patient has a history of anemia and was recently transfused after Thanksgiving. He does not recall being worked up by GI. However, he was seen by Dr. Cristina Gong due to post prostate biopsy hemorrhage in November.  ED Course: The patient will receive fresh frozen plasma, packed RBCs and vitamin K in the emergency department. WBC 5.1, hemoglobin level VI.4 g/dL, platelets 206 and INR 2.93. His CMP shows BUN 23, creatinine 1.46 and glucose of 134 mg/dL.  Review of Systems: As per HPI otherwise 10 point review of systems negative.    Past Medical History:  Diagnosis Date  . Allergic rhinitis   . Anemia   . Anxiety   . Aortic stenosis 2012   mild to mod   . Atrial fibrillation ([place]) 1991   with multiple DCCV  . Bipolar affective disorder ([place])   . COPD (chronic obstructive pulmonary disease) ([place])   . Diabetic neuropathy ([place])    in feet  . Dyslipidemia   . Fatigue    last year or so  . Gout   . Hypertension   . Insomnia   . Obesity   . On home oxygen therapy 2 1/2 liters with bipap at night  . OSA (obstructive sleep apnea)    severe, uses bipap 16 1/2 by 12 or  13 setting  . Prostate cancer ([place])   . Rectal bleeding 12/16/2015  . Type 2 diabetes mellitus ([place])   . Vertigo     Past Surgical History:  Procedure Laterality Date  . CARDIOVERSION  yrs ago  . COLONOSCOPY WITH PROPOFOL N/A 01/23/2013   Procedure: COLONOSCOPY WITH PROPOFOL;  Surgeon: Garlan Fair, MD;  Location: [facility];  Service: Endoscopy;  Laterality: N/A;  . electro shock  1969   for depression  . ESOPHAGOGASTRODUODENOSCOPY (EGD) WITH PROPOFOL N/A 01/23/2013   Procedure: ESOPHAGOGASTRODUODENOSCOPY (EGD) WITH PROPOFOL;  Surgeon: Garlan Fair, MD;  [facility];  Service: Endoscopy;  Laterality: N/A;  . HEMORRHOID SURGERY N/A 12/16/2015   Procedure: PROCTOSCOPY WITH CONTROL OF BLEEDING;  Surgeon: Fanny Skates, MD;  Location: [facility];  Service: General;  Laterality: N/A;  . KNEE SURGERY  1970's   rt  . MAZE  1998  . PROSTATE BIOPSY    . SHOULDER SURGERY  7-8 yrs ago   rt     reports that he quit smoking about 39 years ago. His smoking use included Cigarettes. He has a 50.00 pack-year smoking history. He has never used smokeless tobacco. He reports that he does not drink alcohol or use drugs.  Allergies  Allergen Reactions  . Penicillins Anaphylaxis    Has patient had a PCN reaction causing immediate rash, facial/tongue/throat swelling, SOB or lightheadedness with hypotension: unknown Has patient had a  PCN reaction causing severe rash involving mucus membranes or skin necrosis: unknown Has patient had a PCN reaction that required hospitalization: unknown Has patient had a PCN reaction occurring within the last 10 years: no If all of the above answers are "NO", then may proceed with Cephalosporin use.   Marland Kitchen Antihistamines, Loratadine-Type     depression  . Other     Beta blockers-Novacaine depression    Family History  Problem Relation Age of Onset  . Tuberculosis Maternal Grandmother   . Heart attack Neg Hx   . Diabetes Neg Hx   . CAD Neg Hx   .  Cancer Neg Hx     Prior to Admission medications   Medication Sig Start Date End Date Taking? Authorizing Provider  allopurinol (ZYLOPRIM) 100 MG tablet Take 100 mg by mouth daily.    Yes Historical Provider, MD  buPROPion (WELLBUTRIN XL) 150 MG 24 hr tablet Take 1 tablet (150 mg total) by mouth daily. 12/18/15  Yes Ambrose Finland, MD  CARTIA XT 120 MG 24 hr capsule Take 120 mg by mouth daily. 12/18/15  Yes Historical Provider, MD  citalopram (CELEXA) 40 MG tablet Take 20 mg by mouth daily.   Yes Historical Provider, MD  dextromethorphan-guaiFENesin (MUCINEX DM) 30-600 MG 12hr tablet Take 1 tablet by mouth 2 (two) times daily.   Yes Historical Provider, MD  diltiazem (DILACOR XR) 120 MG 24 hr capsule Take 120 mg by mouth at bedtime.   Yes Historical Provider, MD  fluticasone (FLONASE) 50 MCG/ACT nasal spray Place 2 sprays into both nostrils daily. 01/27/16  Yes Waynetta Pean, PA-C  furosemide (LASIX) 80 MG tablet Take 80 mg by mouth daily.  10/22/15  Yes Historical Provider, MD  iron polysaccharides (NIFEREX) 150 MG capsule Take 1 capsule (150 mg total) by mouth 2 (two) times daily. 10/23/14  Yes Eugenie Filler, MD  JANUVIA 100 MG tablet Take 1 tablet by mouth daily. 06/15/15  Yes Historical Provider, MD  lovastatin (MEVACOR) 20 MG tablet Take 20 mg by mouth daily at 6 PM.    Yes Historical Provider, MD  meclizine (ANTIVERT) 25 MG tablet Take 1 tablet (25 mg total) by mouth 3 (three) times daily as needed for dizziness. 01/28/16  Yes Eber Jones, MD  metFORMIN (GLUCOPHAGE) 1000 MG tablet Take 1,000 mg by mouth 2 (two) times daily.  05/17/14  Yes Historical Provider, MD  OVER THE COUNTER MEDICATION Take 1 tablet by mouth 2 (two) times daily. OTC Vision supplement   Yes Historical Provider, MD  potassium chloride SA (K-DUR,KLOR-CON) 20 MEQ tablet Take 1 tablet by mouth daily. 09/10/14  Yes Historical Provider, MD  temazepam (RESTORIL) 15 MG capsule Take 15 mg by mouth at bedtime.   02/04/16  Yes Historical Provider, MD  valsartan (DIOVAN) 160 MG tablet Take 160 mg by mouth every morning.    Yes Historical Provider, MD  warfarin (COUMADIN) 5 MG tablet Take 5-7.5 mg by mouth. Take 7.5mg  by mouth on Monday, Tuesday, Thursday,Saturday, and Sunday. Take 5mg  by mouth on wednesday & friday 12/09/15  Yes Historical Provider, MD    Physical Exam:  Constitutional: NAD, calm, comfortable Vitals:   02/26/16 2111 02/26/16 2130 02/26/16 2151 02/26/16 2247  BP: 129/61 113/69 113/69 129/72  Pulse: 73 77 71 76  Resp: 23 26 20 22   Temp:    97.3 F (36.3 C)  TempSrc:    Oral  SpO2: 95% 95% 96% 96%   Eyes: PERRL, lids and conjunctivae are pale. ENMT:  Mucous membranes are moist. Posterior pharynx clear of any exudate or lesions. Dentures. Neck: normal, supple, no masses, no thyromegaly Respiratory: clear to auscultation bilaterally, no wheezing, no crackles. Normal respiratory effort. No accessory muscle use.  Cardiovascular: Irregularly irregular, no murmurs / rubs / gallops. No extremity edema. 2+ pedal pulses. No carotid bruits.  Abdomen: Distended, no tenderness, no masses palpated. No hepatosplenomegaly. Bowel sounds positive.  Musculoskeletal: no clubbing / cyanosis.Good ROM, no contractures. Normal muscle tone.  Skin: no rashes, lesions, ulcers. No induration Neurologic: CN 2-12 grossly intact. Sensation intact, DTR normal. Strength 5/5 in all 4.  Psychiatric: Normal judgment and insight. Alert and oriented x 3. Normal mood.    Labs on Admission: I have personally reviewed following labs and imaging studies  CBC:  Recent Labs Lab 02/26/16 2022  WBC 5.1  HGB 6.4*  HCT 21.4*  MCV 84.6  PLT 99991111   Basic Metabolic Panel:  Recent Labs Lab 02/26/16 2022  NA 138  K 4.1  CL 107  CO2 25  GLUCOSE 134*  BUN 23*  CREATININE 1.46*  CALCIUM 8.9   GFR: CrCl cannot be calculated (Unknown ideal weight.). Liver Function Tests:  Recent Labs Lab 02/26/16 2022  AST 19    ALT 10*  ALKPHOS 72  BILITOT 0.5  PROT 7.2  ALBUMIN 3.9   No results for input(s): LIPASE, AMYLASE in the last 168 hours. No results for input(s): AMMONIA in the last 168 hours. Coagulation Profile:  Recent Labs Lab 02/26/16 2022  INR 2.93   Cardiac Enzymes: No results for input(s): CKTOTAL, CKMB, CKMBINDEX, TROPONINI in the last 168 hours. BNP (last 3 results) No results for input(s): PROBNP in the last 8760 hours. HbA1C: No results for input(s): HGBA1C in the last 72 hours. CBG: No results for input(s): GLUCAP in the last 168 hours. Lipid Profile: No results for input(s): CHOL, HDL, LDLCALC, TRIG, CHOLHDL, LDLDIRECT in the last 72 hours. Thyroid Function Tests: No results for input(s): TSH, T4TOTAL, FREET4, T3FREE, THYROIDAB in the last 72 hours. Anemia Panel: No results for input(s): VITAMINB12, FOLATE, FERRITIN, TIBC, IRON, RETICCTPCT in the last 72 hours. Urine analysis:    Component Value Date/Time   COLORURINE YELLOW 01/27/2016 1730   APPEARANCEUR CLOUDY (A) 01/27/2016 1730   LABSPEC 1.019 01/27/2016 1730   PHURINE 5.0 01/27/2016 1730   GLUCOSEU NEGATIVE 01/27/2016 1730   HGBUR NEGATIVE 01/27/2016 1730   BILIRUBINUR NEGATIVE 01/27/2016 1730   KETONESUR 5 (A) 01/27/2016 1730   PROTEINUR NEGATIVE 01/27/2016 1730   UROBILINOGEN 0.2 10/22/2014 2136   NITRITE NEGATIVE 01/27/2016 1730   LEUKOCYTESUR NEGATIVE 01/27/2016 1730    Radiological Exams on Admission: No results found.  EKG: Independently reviewed. No results found.  Assessment/Plan Principal problem:   UGI bleed Admit to telemetry a/inpatient. Continue fresh frozen plasma and packed RBC transfusion. Nothing by mouth. Pantoprazole 40 mg IVP every 12 hours. Monitor hematocrit and hemoglobin. Please consult GI in a.m.   Active Problems:   Acute blood loss anemia Continue packed RBC transfusion. Monitor H&H.    DM2 (diabetes mellitus, type 2) ([place]) Currently nothing by mouth. CBG monitoring  every 6 hours.    Atrial fibrillation (HCC) CHA2DS2-VASc Score of at least 4.    COPD mixed type ([place]) Continue supplemental oxygen. Bronchodilators as needed.         DVT prophylaxis: SCDs. Code Status: Full code. Family Communication:  Disposition Plan: Admit for blood products transfusion and GI evaluation in a.m. Consults called:  Admission status: Inpatient  Reubin Milan MD [facility] Pager [phone].  If 7PM-7AM, please contact night-coverage [email or web address] Password [facility]  02/26/2016, 11:32 PM

## 2016-02-26 NOTE — ED Provider Notes (Signed)
[facility] DEPT Provider Note   CSN: [id number] Arrival date & time: 02/26/16  1837  By signing my name below, I, [name], attest that this documentation has been prepared under the direction and in the presence of non-physician practitioner, Armstead Peaks, PA-C. Electronically Signed: [name], Scribe. 02/26/2016. 8:35 PM.  History   Chief Complaint Chief Complaint  Patient presents with  . Anemia   The history is provided by the patient. No language interpreter was used.   HPI Comments: Joel [name] is a 83 y.o. male with a PMHx of prostate cancer who presents to the Emergency Department complaining of intermittent moderate SOB and fatigue that started a few days ago. He states he has sent here at the ED from his PCP due to anemia. His SOB is exacerbated by change in position and exertion. He reports associated dizziness, generalized weakness, and fatigue. He is currently on 2L of oxygen at home (PRN), and has been using his oxygen more often in the past week. He is on warfarin for Afib. He denies any chest pain, blood in stool, hematuria, abdominal pain, or nausea.     PCP: Wenda Low, MD  Past Medical History:  Diagnosis Date  . Allergic rhinitis   . Anemia   . Anxiety   . Aortic stenosis 2012   mild to mod   . Atrial fibrillation ([place]) 1991   with multiple DCCV  . Bipolar affective disorder ([place])   . COPD (chronic obstructive pulmonary disease) ([place])   . Diabetic neuropathy ([place])    in feet  . Dyslipidemia   . Fatigue    last year or so  . Gout   . Hypertension   . Insomnia   . Obesity   . On home oxygen therapy 2 1/2 liters with bipap at night  . OSA (obstructive sleep apnea)    severe, uses bipap 16 1/2 by 12 or 13 setting  . Prostate cancer ([place])   . Rectal bleeding 12/16/2015  . Type 2 diabetes mellitus ([place])   . Vertigo     Patient Active Problem List   Diagnosis Date Noted  . UGI bleed 02/26/2016  . BPPV (benign paroxysmal positional  vertigo) 01/27/2016  . Hypertension 01/27/2016  . Malignant neoplasm of prostate ([place]) 01/07/2016  . Panlobular emphysema ([place])   . Arterial hypotension   . Paroxysmal atrial fibrillation (HCC)   . Lower GI bleed   . Acute blood loss anemia   . Uncontrolled type 2 diabetes mellitus with complication ([place])   . Bipolar I disorder ([place])   . Bright red rectal bleeding 12/16/2015  . Rectal bleeding 12/16/2015  . COPD (chronic obstructive pulmonary disease) with acute bronchitis ([place]) 06/29/2015  . Pulmonary hypertension 06/29/2015  . Fluid overload 06/29/2015  . Acute CHF ([place]) 06/29/2015  . COPD exacerbation ([place]) 06/29/2015  . Anemia 10/22/2014  . Chest pain 08/04/2013  . Moderate aortic stenosis 04/17/2013  . Encounter for monitoring coumadin therapy 04/17/2013  . Anemia, iron deficiency 04/04/2013  . Dyspnea on exertion 11/18/2012  . Obesity hypoventilation syndrome ([place]) 09/19/2012  . ALLERGIC RHINITIS 06/24/2009  . DM2 (diabetes mellitus, type 2) ([name]) 03/13/2007  . Obstructive sleep apnea 03/13/2007  . Atrial fibrillation ([place]) 03/13/2007  . COPD mixed type ([place]) 03/13/2007    Past Surgical History:  Procedure Laterality Date  . CARDIOVERSION  yrs ago  . COLONOSCOPY WITH PROPOFOL N/A 01/23/2013   Procedure: COLONOSCOPY WITH PROPOFOL;  Surgeon: Garlan Fair, MD;  [facility];  Service: Endoscopy;  Laterality: N/A;  . electro shock  1969   for depression  . ESOPHAGOGASTRODUODENOSCOPY (EGD) WITH PROPOFOL N/A 01/23/2013   Procedure: ESOPHAGOGASTRODUODENOSCOPY (EGD) WITH PROPOFOL;  Surgeon: Garlan Fair, MD;  [facility];  Service: Endoscopy;  Laterality: N/A;  . HEMORRHOID SURGERY N/A 12/16/2015   Procedure: PROCTOSCOPY WITH CONTROL OF BLEEDING;  Surgeon: Fanny Skates, MD;  Location: [facility];  Service: General;  Laterality: N/A;  . KNEE SURGERY  1970's   rt  . MAZE  1998  . PROSTATE BIOPSY    . SHOULDER SURGERY  7-8 yrs ago   rt        Home Medications    Prior to Admission medications   Medication Sig Start Date End Date Taking? Authorizing Provider  allopurinol (ZYLOPRIM) 100 MG tablet Take 100 mg by mouth daily.    Yes Historical Provider, MD  buPROPion (WELLBUTRIN XL) 150 MG 24 hr tablet Take 1 tablet (150 mg total) by mouth daily. 12/18/15  Yes Ambrose Finland, MD  CARTIA XT 120 MG 24 hr capsule Take 120 mg by mouth daily. 12/18/15  Yes Historical Provider, MD  citalopram (CELEXA) 40 MG tablet Take 20 mg by mouth daily.   Yes Historical Provider, MD  dextromethorphan-guaiFENesin (MUCINEX DM) 30-600 MG 12hr tablet Take 1 tablet by mouth 2 (two) times daily.   Yes Historical Provider, MD  diltiazem (DILACOR XR) 120 MG 24 hr capsule Take 120 mg by mouth at bedtime.   Yes Historical Provider, MD  fluticasone (FLONASE) 50 MCG/ACT nasal spray Place 2 sprays into both nostrils daily. 01/27/16  Yes Waynetta Pean, PA-C  furosemide (LASIX) 80 MG tablet Take 80 mg by mouth daily.  10/22/15  Yes Historical Provider, MD  iron polysaccharides (NIFEREX) 150 MG capsule Take 1 capsule (150 mg total) by mouth 2 (two) times daily. 10/23/14  Yes Eugenie Filler, MD  JANUVIA 100 MG tablet Take 1 tablet by mouth daily. 06/15/15  Yes Historical Provider, MD  lovastatin (MEVACOR) 20 MG tablet Take 20 mg by mouth daily at 6 PM.    Yes Historical Provider, MD  meclizine (ANTIVERT) 25 MG tablet Take 1 tablet (25 mg total) by mouth 3 (three) times daily as needed for dizziness. 01/28/16  Yes Eber Jones, MD  metFORMIN (GLUCOPHAGE) 1000 MG tablet Take 1,000 mg by mouth 2 (two) times daily.  05/17/14  Yes Historical Provider, MD  OVER THE COUNTER MEDICATION Take 1 tablet by mouth 2 (two) times daily. OTC Vision supplement   Yes Historical Provider, MD  potassium chloride SA (K-DUR,KLOR-CON) 20 MEQ tablet Take 1 tablet by mouth daily. 09/10/14  Yes Historical Provider, MD  temazepam (RESTORIL) 15 MG capsule Take 15 mg by mouth  at bedtime.  02/04/16  Yes Historical Provider, MD  valsartan (DIOVAN) 160 MG tablet Take 160 mg by mouth every morning.    Yes Historical Provider, MD  warfarin (COUMADIN) 5 MG tablet Take 5-7.5 mg by mouth. Take 7.5mg  by mouth on Monday, Tuesday, Thursday,Saturday, and Sunday. Take 5mg  by mouth on wednesday & friday 12/09/15  Yes Historical Provider, MD    Family History Family History  Problem Relation Age of Onset  . Tuberculosis Maternal Grandmother   . Heart attack Neg Hx   . Diabetes Neg Hx   . CAD Neg Hx   . Cancer Neg Hx     Social History Social History  Substance Use Topics  . Smoking status: Former Smoker    Packs/day: 2.00  Years: 25.00    Types: Cigarettes    Quit date: 07/08/1976  . Smokeless tobacco: Never Used  . Alcohol use No     Comment: in AA since 1977, no alcohol since 1977     Allergies   Penicillins; Antihistamines, loratadine-type; and Other   Review of Systems Review of Systems  Constitutional: Positive for fatigue. Negative for chills and fever.  HENT: Negative for facial swelling and sore throat.   Respiratory: Positive for shortness of breath.   Cardiovascular: Negative for chest pain.  Gastrointestinal: Negative for abdominal pain, blood in stool, nausea and vomiting.  Genitourinary: Negative for dysuria and hematuria.  Musculoskeletal: Negative for back pain.  Skin: Negative for rash and wound.  Neurological: Positive for weakness (generalized). Negative for headaches.  Hematological: Bruises/bleeds easily.  Psychiatric/Behavioral: The patient is not nervous/anxious.      Physical Exam Updated Vital Signs BP (!) 104/53 (BP Location: Left Arm)   Pulse 76   Temp 98.1 F (36.7 C) (Oral)   Resp 18   SpO2 100%   Physical Exam  Constitutional: He appears well-developed and well-nourished. No distress.  Generally pale.  HENT:  Head: Normocephalic and atraumatic.  Mouth/Throat: Oropharynx is clear and moist. No oropharyngeal exudate.   Eyes: Pupils are equal, round, and reactive to light. Right eye exhibits no discharge. Left eye exhibits no discharge. No scleral icterus.  Pale conjunctivae.   Neck: Normal range of motion. Neck supple. No thyromegaly present.  Cardiovascular: Normal rate, regular rhythm, normal heart sounds and intact distal pulses.  Exam reveals no gallop and no friction rub.   No murmur heard. Pulmonary/Chest: Effort normal and breath sounds normal. No stridor. No respiratory distress. He has no wheezes. He has no rales.  Abdominal: Soft. Bowel sounds are normal. He exhibits no distension. There is no tenderness. There is no rebound and no guarding.  Musculoskeletal: He exhibits no edema.  Lymphadenopathy:    He has no cervical adenopathy.  Neurological: He is alert. Coordination normal.  Skin: Skin is warm and dry. No rash noted. He is not diaphoretic. No pallor.  Psychiatric: He has a normal mood and affect.  Nursing note and vitals reviewed.    ED Treatments / Results  DIAGNOSTIC STUDIES: Oxygen Saturation is 100% on RA, normal by my interpretation.    COORDINATION OF CARE: 8:40 PM- Pt advised of plan for treatment and pt agrees.  Labs (all labs ordered are listed, but only abnormal results are displayed) Labs Reviewed  COMPREHENSIVE METABOLIC PANEL - Abnormal; Notable for the following:       Result Value   Glucose, Bld 134 (*)    BUN 23 (*)    Creatinine, Ser 1.46 (*)    ALT 10 (*)    GFR calc non Af Amer 43 (*)    GFR calc Af Amer 50 (*)    All other components within normal limits  CBC - Abnormal; Notable for the following:    RBC 2.53 (*)    Hemoglobin 6.4 (*)    HCT 21.4 (*)    MCH 25.3 (*)    MCHC 29.9 (*)    RDW 18.2 (*)    All other components within normal limits  PROTIME-INR - Abnormal; Notable for the following:    Prothrombin Time 31.2 (*)    All other components within normal limits  HEMOGLOBIN AND HEMATOCRIT, BLOOD - Abnormal; Notable for the following:     Hemoglobin 6.2 (*)    HCT 20.6 (*)  All other components within normal limits  POC OCCULT BLOOD, ED - Abnormal; Notable for the following:    Fecal Occult Bld POSITIVE (*)    All other components within normal limits  PROTIME-INR  COMPREHENSIVE METABOLIC PANEL  CBC  TYPE AND SCREEN  PREPARE FRESH FROZEN PLASMA  PREPARE RBC (CROSSMATCH)    EKG  EKG Interpretation None       Radiology No results found.  Procedures Procedures (including critical care time)  Medications Ordered in ED Medications  sodium chloride flush (NS) 0.9 % injection 3 mL (not administered)  ondansetron (ZOFRAN) tablet 4 mg (not administered)    Or  ondansetron (ZOFRAN) injection 4 mg (not administered)  pantoprazole (PROTONIX) injection 40 mg (not administered)  phytonadione (VITAMIN K) 10 mg in dextrose 5 % 50 mL IVPB (0 mg Intravenous Stopped 02/27/16 0040)  0.9 %  sodium chloride infusion ( Intravenous Stopped 02/27/16 0003)  0.9 %  sodium chloride infusion ( Intravenous Stopped 02/27/16 0120)  pantoprazole (PROTONIX) injection 40 mg (40 mg Intravenous Given 02/26/16 2337)     Initial Impression / Assessment and Plan / ED Course  I have reviewed the triage vital signs and the nursing notes.  Pertinent labs & imaging results that were available during my care of the patient were reviewed by me and considered in my medical decision making (see chart for details).     Patient with symptomatic anemia. CBC shows hemoglobin 6.4. CMP shows BUN 23, creatinine 1.46. Fecal occult positive. INR 2.3, PTT 31.2. Type and screen sent. Vitamin K and FFP for Warfarin reversal and 2 units of packed RBCs were ordered. I consulted Dr. Olevia Bowens with [facility] who will admit the patient for further evaluation and treatment. Patient vitals stable throughout ED course. Patient also evaluated Dr. Ellender Hose who guided the patient's management and agrees with plan.  Final Clinical Impressions(s) / ED Diagnoses   Final  diagnoses:  Anemia, unspecified type    New Prescriptions New Prescriptions   No medications on file   I personally performed the services described in this documentation, which was scribed in my presence. The recorded information has been reviewed and is accurate.     Frederica Kuster, PA-C 02/27/16 [id number]    Duffy Bruce, MD 02/28/16 1046

## 2016-02-26 NOTE — ED Triage Notes (Signed)
Pt reports he was sent here by PCP for admission for anemia. Has been feeling progressively weaker for the past 10 days. Hgb 8.0 at PCP office. Hx of anemia, last transfusion just after Thanksgiving.

## 2016-02-27 LAB — COMPREHENSIVE METABOLIC PANEL
ALT: 11 U/L — ABNORMAL LOW (ref 17–63)
ANION GAP: 7 (ref 5–15)
AST: 21 U/L (ref 15–41)
Albumin: 4 g/dL (ref 3.5–5.0)
Alkaline Phosphatase: 70 U/L (ref 38–126)
BILIRUBIN TOTAL: 1.2 mg/dL (ref 0.3–1.2)
BUN: 17 mg/dL (ref 6–20)
CALCIUM: 8.6 mg/dL — AB (ref 8.9–10.3)
CO2: 25 mmol/L (ref 22–32)
Chloride: 106 mmol/L (ref 101–111)
Creatinine, Ser: 1.24 mg/dL (ref 0.61–1.24)
GFR calc Af Amer: >60 mL/min (ref >60)
GFR calc non Af Amer: 53 mL/min — ABNORMAL LOW (ref >60)
GLUCOSE: 139 mg/dL — AB (ref 65–99)
POTASSIUM: 4.2 mmol/L (ref 3.5–5.1)
Sodium: 138 mmol/L (ref 135–145)
TOTAL PROTEIN: 6.8 g/dL (ref 6.5–8.1)

## 2016-02-27 LAB — CBC WITH DIFFERENTIAL/PLATELET
BASOS ABS: 0 10*3/uL (ref 0.0–0.1)
BASOS PCT: 0 %
EOS PCT: 3 %
Eosinophils Absolute: 0.1 10*3/uL (ref 0.0–0.7)
HCT: 26.8 % — ABNORMAL LOW (ref 39.0–52.0)
Hemoglobin: 7.8 g/dL — ABNORMAL LOW (ref 13.0–17.0)
LYMPHS PCT: 13 %
Lymphs Abs: 0.6 10*3/uL — ABNORMAL LOW (ref 0.7–4.0)
MCH: 24.2 pg — ABNORMAL LOW (ref 26.0–34.0)
MCHC: 29.1 g/dL — ABNORMAL LOW (ref 30.0–36.0)
MCV: 83.2 fL (ref 78.0–100.0)
MONO ABS: 0.5 10*3/uL (ref 0.1–1.0)
Monocytes Relative: 10 %
NEUTROS ABS: 3.6 10*3/uL (ref 1.7–7.7)
Neutrophils Relative %: 74 %
PLATELETS: 198 10*3/uL (ref 150–400)
RBC: 3.22 MIL/uL — ABNORMAL LOW (ref 4.22–5.81)
RDW: 16.9 % — AB (ref 11.5–15.5)
WBC: 4.9 10*3/uL (ref 4.0–10.5)

## 2016-02-27 LAB — PROTIME-INR
INR: 1.46
Prothrombin Time: 17.9 seconds — ABNORMAL HIGH (ref 11.4–15.2)

## 2016-02-27 LAB — GLUCOSE, CAPILLARY
GLUCOSE-CAPILLARY: 161 mg/dL — AB (ref 65–99)
GLUCOSE-CAPILLARY: 117 mg/dL — AB (ref 65–99)
Glucose-Capillary: 129 mg/dL — ABNORMAL HIGH (ref 65–99)

## 2016-02-27 LAB — PREPARE RBC (CROSSMATCH)

## 2016-02-27 LAB — HEMOGLOBIN AND HEMATOCRIT, BLOOD
HEMATOCRIT: 20.6 % — AB (ref 39.0–52.0)
HEMOGLOBIN: 6.2 g/dL — AB (ref 13.0–17.0)

## 2016-02-27 MED ORDER — INSULIN ASPART 100 UNIT/ML ~~LOC~~ SOLN
0.0000 [IU] | Freq: Four times a day (QID) | SUBCUTANEOUS | Status: DC
  Administered 2016-02-28: 2 [IU] via SUBCUTANEOUS

## 2016-02-27 MED ORDER — ONDANSETRON HCL 4 MG PO TABS: 4.0000 mg | ORAL_TABLET | Freq: Four times a day (QID) | ORAL | Status: DC | PRN

## 2016-02-27 MED ORDER — ONDANSETRON HCL 4 MG/2ML IJ SOLN
4.0000 mg | Freq: Four times a day (QID) | INTRAMUSCULAR | Status: DC | PRN
  Administered 2016-03-03: 4 mg via INTRAVENOUS

## 2016-02-27 MED ORDER — PANTOPRAZOLE SODIUM 40 MG IV SOLR
40.0000 mg | Freq: Two times a day (BID) | INTRAVENOUS | Status: DC
  Administered 2016-02-27 – 2016-03-04 (×13): 40 mg via INTRAVENOUS
  Filled 2016-02-27 (×12): qty 40

## 2016-02-27 MED ORDER — SODIUM CHLORIDE 0.9% FLUSH
3.0000 mL | Freq: Two times a day (BID) | INTRAVENOUS | Status: DC
  Administered 2016-02-27 – 2016-03-04 (×12): 3 mL via INTRAVENOUS

## 2016-02-27 NOTE — Consult Note (Signed)
[facility] Gastroenterology Consultation Note  Referring Provider: Dr. Tennis Must [facility]) Primary Care Physician:  Wenda Low, MD Primary Gastroenterologist:  Dr. Earle Gell  Reason for Consultation:  anemia  HPI: Joel [name] is a 83 y.o. male whom I've been asked to see for melena and anemia.  Patient with prostate cancer, atrial fibrillation on warfarin, oxygen-dependent COPD.  Patient denies melena; tells me he's had dark stools consistently for the past 3 years since starting oral iron.  Patient has 3+ history of recurrent microcytic anemia for which he had endoscopy (normal) and colonoscopy (left-sided diverticulosis; small cecal polyp) in March 2014 by Dr. Earle Gell, and no source of anemia seen, and started on oral iron at that time.  Had bleeding from prostate biopsy last winter, but other than that he denies overt bleeding.  Several week history of vertigo, dizziness, weakness.  No abdominal pain, hematochezia, hematemesis.  INR 2.9 upon admission.  On no PPI.   Past Medical History:  Diagnosis Date  . Allergic rhinitis   . Anemia   . Anxiety   . Aortic stenosis 2012   mild to mod   . Atrial fibrillation ([place]) 1991   with multiple DCCV  . Bipolar affective disorder ([place])   . COPD (chronic obstructive pulmonary disease) ([place])   . Diabetic neuropathy ([place])    in feet  . Dyslipidemia   . Fatigue    last year or so  . Gout   . Hypertension   . Insomnia   . Obesity   . On home oxygen therapy 2 1/2 liters with bipap at night  . OSA (obstructive sleep apnea)    severe, uses bipap 16 1/2 by 12 or 13 setting  . Prostate cancer ([place])   . Rectal bleeding 12/16/2015  . Type 2 diabetes mellitus ([place])   . Vertigo     Past Surgical History:  Procedure Laterality Date  . CARDIOVERSION  yrs ago  . COLONOSCOPY WITH PROPOFOL N/A 01/23/2013   Procedure: COLONOSCOPY WITH PROPOFOL;  Surgeon: Garlan Fair, MD;  Location: [facility];  Service: Endoscopy;  Laterality: N/A;   . electro shock  1969   for depression  . ESOPHAGOGASTRODUODENOSCOPY (EGD) WITH PROPOFOL N/A 01/23/2013   Procedure: ESOPHAGOGASTRODUODENOSCOPY (EGD) WITH PROPOFOL;  Surgeon: Garlan Fair, MD;  Location: [facility];  Service: Endoscopy;  Laterality: N/A;  . HEMORRHOID SURGERY N/A 12/16/2015   Procedure: PROCTOSCOPY WITH CONTROL OF BLEEDING;  Surgeon: Fanny Skates, MD;  Location: [place];  Service: General;  Laterality: N/A;  . KNEE SURGERY  1970's   rt  . MAZE  1998  . PROSTATE BIOPSY    . SHOULDER SURGERY  7-8 yrs ago   rt    Prior to Admission medications   Medication Sig Start Date End Date Taking? Authorizing Provider  allopurinol (ZYLOPRIM) 100 MG tablet Take 100 mg by mouth daily.    Yes Historical Provider, MD  buPROPion (WELLBUTRIN XL) 150 MG 24 hr tablet Take 1 tablet (150 mg total) by mouth daily. 12/18/15  Yes Ambrose Finland, MD  CARTIA XT 120 MG 24 hr capsule Take 120 mg by mouth daily. 12/18/15  Yes Historical Provider, MD  citalopram (CELEXA) 40 MG tablet Take 20 mg by mouth daily.   Yes Historical Provider, MD  dextromethorphan-guaiFENesin (MUCINEX DM) 30-600 MG 12hr tablet Take 1 tablet by mouth 2 (two) times daily.   Yes Historical Provider, MD  diltiazem (DILACOR XR) 120 MG 24 hr capsule Take 120 mg by mouth at  bedtime.   Yes Historical Provider, MD  fluticasone (FLONASE) 50 MCG/ACT nasal spray Place 2 sprays into both nostrils daily. 01/27/16  Yes Waynetta Pean, PA-C  furosemide (LASIX) 80 MG tablet Take 80 mg by mouth daily.  10/22/15  Yes Historical Provider, MD  iron polysaccharides (NIFEREX) 150 MG capsule Take 1 capsule (150 mg total) by mouth 2 (two) times daily. 10/23/14  Yes Eugenie Filler, MD  JANUVIA 100 MG tablet Take 1 tablet by mouth daily. 06/15/15  Yes Historical Provider, MD  lovastatin (MEVACOR) 20 MG tablet Take 20 mg by mouth daily at 6 PM.    Yes Historical Provider, MD  meclizine (ANTIVERT) 25 MG tablet Take 1 tablet (25 mg total)  by mouth 3 (three) times daily as needed for dizziness. 01/28/16  Yes Eber Jones, MD  metFORMIN (GLUCOPHAGE) 1000 MG tablet Take 1,000 mg by mouth 2 (two) times daily.  05/17/14  Yes Historical Provider, MD  OVER THE COUNTER MEDICATION Take 1 tablet by mouth 2 (two) times daily. OTC Vision supplement   Yes Historical Provider, MD  potassium chloride SA (K-DUR,KLOR-CON) 20 MEQ tablet Take 1 tablet by mouth daily. 09/10/14  Yes Historical Provider, MD  temazepam (RESTORIL) 15 MG capsule Take 15 mg by mouth at bedtime.  02/04/16  Yes Historical Provider, MD  valsartan (DIOVAN) 160 MG tablet Take 160 mg by mouth every morning.    Yes Historical Provider, MD  warfarin (COUMADIN) 5 MG tablet Take 5-7.5 mg by mouth. Take 7.5mg  by mouth on Monday, Tuesday, Thursday,Saturday, and Sunday. Take 5mg  by mouth on wednesday & friday 12/09/15  Yes Historical Provider, MD    Current Facility-Administered Medications  Medication Dose Route Frequency Provider Last Rate Last Dose  . ondansetron (ZOFRAN) tablet 4 mg  4 mg Oral Q6H PRN Reubin Milan, MD       Or  . ondansetron [facility]) injection 4 mg  4 mg Intravenous Q6H PRN Reubin Milan, MD      . pantoprazole (PROTONIX) injection 40 mg  40 mg Intravenous Q12H Reubin Milan, MD   40 mg at 02/27/16 1214  . sodium chloride flush (NS) 0.9 % injection 3 mL  3 mL Intravenous Q12H Reubin Milan, MD   3 mL at 02/27/16 1214    Allergies as of 02/26/2016 - Review Complete 02/26/2016  Allergen Reaction Noted  . Penicillins Anaphylaxis 12/16/2015  . Antihistamines, loratadine-type  01/18/2013  . Other  01/18/2013    Family History  Problem Relation Age of Onset  . Tuberculosis Maternal Grandmother   . Heart attack Neg Hx   . Diabetes Neg Hx   . CAD Neg Hx   . Cancer Neg Hx     Social History   Social History  . Marital status: Widowed    Spouse name: N/A  . Number of children: N/A  . Years of education: N/A   Occupational History   . Surveyor    Social History Main Topics  . Smoking status: Former Smoker    Packs/day: 2.00    Years: 25.00    Types: Cigarettes    Quit date: 07/08/1976  . Smokeless tobacco: Never Used  . Alcohol use No     Comment: in AA since 1977, no alcohol since 1977  . Drug use: No     Comment: marijuana use 37 years ago  . Sexual activity: Yes   Other Topics Concern  . Not on file   Social History Narrative  .  No narrative on file    Review of Systems: Positive = bold Gen: Denies any fever, chills, rigors, night sweats, anorexia, fatigue, weakness, malaise, involuntary weight loss, and sleep disorder CV: Denies chest pain, angina, palpitations, syncope, orthopnea, PND, peripheral edema, and claudication. Resp: Denies dyspnea, cough, sputum, wheezing, coughing up blood. GI: Described in detail in HPI.    GU : Denies urinary burning, blood in urine, urinary frequency, urinary hesitancy, nocturnal urination, and urinary incontinence. MS: Denies joint pain or swelling.  Denies muscle weakness, cramps, atrophy.  Derm: Denies rash, itching, oral ulcerations, hives, unhealing ulcers.  Psych: Denies depression, anxiety, memory loss, suicidal ideation, hallucinations,  and confusion. Heme: Denies bruising, bleeding, and enlarged lymph nodes. Neuro:  Denies any headaches, dizziness, paresthesias. Endo:  Denies any problems with DM, thyroid, adrenal function.  Physical Exam: Vital signs in last 24 hours: Temp:  [97.3 F (36.3 C)-98.7 F (37.1 C)] 97.6 F (36.4 C) (01/26 1207) Pulse Rate:  [65-98] 69 (01/26 1207) Resp:  [16-32] 20 (01/26 1207) BP: (90-156)/(33-89) 124/63 (01/26 1207) SpO2:  [94 %-100 %] 98 % (01/26 1207) Weight:  [110.8 kg (244 lb 4.3 oz)] 110.8 kg (244 lb 4.3 oz) (01/26 1054)   General:   Alert, overweight, chronically ill-appearing, tachypneic-appearing Head:  Normocephalic and atraumatic. Eyes:  Sclera clear, no icterus.   Conjunctiva pale Ears:  Normal auditory  acuity. Nose:  No deformity, discharge,  or lesions. Mouth:  No deformity or lesions.  Oropharynx pale and dry. Neck:  Supple; no masses or thyromegaly. Lungs:  Diminished aeration throughout, distant breath sounds, no focal wheezes, crackles, or rhonchi. No acute distress. Heart:  Regular rate and irregularly irregular rhythm; no murmurs, clicks, rubs,  or gallops. Abdomen:  Soft, protuberant, nontender and nondistended. No masses, hepatosplenomegaly or hernias noted. Normal bowel sounds, without guarding, and without rebound.     Msk:  Symmetrical without gross deformities. Normal posture. Pulses:  Normal pulses noted. Extremities:  Venous stasis changes; 1-2+ pitting edema bilateral lower extremities, Neurologic:  Alert and  oriented x4; diffusely weak, otherwise grossly normal neurologically. Skin:  Ecchymoses scattered, otherwise intact without significant lesions or rashes. Psych:  Alert and cooperative. Normal mood and affect.   Lab Results:  Recent Labs  02/26/16 2022 02/27/16 0302  WBC 5.1  --   HGB 6.4* 6.2*  HCT 21.4* 20.6*  PLT 206  --    BMET  Recent Labs  02/26/16 2022  NA 138  K 4.1  CL 107  CO2 25  GLUCOSE 134*  BUN 23*  CREATININE 1.46*  CALCIUM 8.9   LFT  Recent Labs  02/26/16 2022  PROT 7.2  ALBUMIN 3.9  AST 19  ALT 10*  ALKPHOS 72  BILITOT 0.5   PT/INR  Recent Labs  02/26/16 2022  LABPROT 31.2*  INR 2.93    Studies/Results: No results found.  Impression:  1.  Dark stools.  Not clearly melena, as he's had dark stools for 3 years since starting oral iron. 2.  Anemia, recurrent, in setting of anticoagulation.  Ongoing for 3+ years.  Has had endoscopy/colonoscopy evaluations for this. 3.  Multiple medical problems including COPD on home oxygen. 4.  Chronic anticoagulation, warfarin, for atrial fibrillation.  Plan:  1.  Hold anticoagulation. 2.  PPI. 3.  Full liquid diet ok. 4.  Serial CBCs. 5.  Transfuse to Hgb goal ~ 8. 6.   Patient's respiratory status overall a bit tenuous at this time for consideration of endoscopy; in absence of  overt destabilizing bleeding (which patient currently does not have), would likely wait a couple days before endoscopy, perhaps tentatively consider endoscopy Monday with propofol if Hgb >/= 8 and INR </= 1.6. 7.  [facility] will follow.  If no obvious source of anemia seen on endoscopy, might consider capsule endoscopy this admission.    LOS: 1 day   Riggin Cuttino Jerilynn Mages  02/27/2016, 1:22 PM  Pager [phone] If no answer or after 5 PM call [phone]

## 2016-02-27 NOTE — Progress Notes (Signed)
PROGRESS NOTE    Joel [name]  [id number] DOB: [birth year] DOA: 02/26/2016 PCP: Wenda Low, MD    Brief Narrative:  Joel [name] is a 83 y.o. male with medical history significant of allergic rhinitis, anemia, anxiety, aortic stenosis, chronic atrial fibrillation, bipolar affective disorder, COPD on home oxygen, diabetic neuropathy, hyperlipidemia, gout, hypertension, prostate cancer, type 2 diabetes who was referred by his PCP to the emergency department after the patient has been feeling progressively worse with fatigue, dyspnea, dizziness and reports melanotic stools for the past week and a half. His hemoglobin level at his PCP office today was 8.0 g/dL. He denies chest pain, abdominal pain, nausea, emesis, diarrhea or constipation. He denies frequency or dysuria.  The patient has a history of anemia and was recently transfused after Thanksgiving. He does not recall being worked up by GI. However, he was seen by Dr. Cristina Gong due to post prostate biopsy hemorrhage in November.  ED Course: The patient will receive fresh frozen plasma, packed RBCs and vitamin K in the emergency department. WBC 5.1, hemoglobin level VI.4 g/dL, platelets 206 and INR 2.93. His CMP shows BUN 23, creatinine 1.46 and glucose of 134 mg/dL.  Patient was ordered for 2 unit PRBC transfusion.  [facility] consulted.   Assessment & Plan:   Principal Problem:   UGI bleed Active Problems:   DM2 (diabetes mellitus, type 2) (HCC)   Atrial fibrillation (HCC)   COPD mixed type (HCC)   Acute blood loss anemia   UGI bleed Continue fresh frozen plasma and packed RBC transfusion. Can start clear liquid diet per gastroenterology Pantoprazole 40 mg IVP every 12 hours. Monitor hematocrit and hemoglobin. [facility] consulted Repeat H/H after transfusion pending Two large bore IVs in place Monitor for fluid overload  Acute blood loss anemia Continue packed RBC transfusion. Monitor H&H    DM2 (diabetes mellitus,  type 2) ([place]) Currently nothing by mouth. CBG monitoring every 6 hours.    Atrial fibrillation (HCC) CHA2DS2-VASc Score of at least 4 Hold anticoagulation Will restart diltiazem in am if BP and HR stable    COPD mixed type (HCC) Continue supplemental oxygen. Bronchodilators as needed.         DVT prophylaxis: SCDs. Code Status: Full code. Family Communication: patient's friend bedside Disposition Plan: Admit for blood products transfusion and GI evaluation in a.m.   Consultants:   [facility]  Procedures:   None  Antimicrobials:   None    Subjective: Patient seen and still receiving blood transfusion.  Per patient he has had melanotic stools at least the past 3 days. Feeling very weak, very tired, very dizzy.  Denies any black or tarry stools since arriving to the floor.  Objective: Vitals:   02/27/16 1054 02/27/16 1126 02/27/16 1147 02/27/16 1207  BP: (!) 145/78 132/74 132/66 124/63  Pulse: 88 73 76 69  Resp: (!) 22 20 20 20   Temp: 97.4 F (36.3 C) 97.6 F (36.4 C) 98.4 F (36.9 C) 97.6 F (36.4 C)  TempSrc:  Oral Oral Oral  SpO2: 99% 99% 99% 98%  Weight: 110.8 kg (244 lb 4.3 oz)       Intake/Output Summary (Last 24 hours) at 02/27/16 1347 Last data filed at 02/27/16 1152  Gross per 24 hour  Intake              706 ml  Output              600 ml  Net  106 ml   Filed Weights   02/27/16 1054  Weight: 110.8 kg (244 lb 4.3 oz)    Examination:  General exam: Appears calm and comfortable  Respiratory system: Clear to auscultation. Respiratory effort normal. Cardiovascular system: S1 & S2 heard, irregular rhythm, regular rate. No JVD, murmurs, rubs, gallops or clicks. No pedal edema. Gastrointestinal system: Abdomen is nondistended, soft and nontender. No organomegaly or masses felt. Normal bowel sounds heard. Central nervous system: Alert and oriented. No focal neurological deficits. Extremities: Symmetric 5 x 5 power. Skin: No  rashes, lesions or ulcers, pallor Psychiatry: Judgement and insight appear normal. Mood & affect appropriate.     Data Reviewed: I have personally reviewed following labs and imaging studies  CBC:  Recent Labs Lab 02/26/16 2022 02/27/16 0302  WBC 5.1  --   HGB 6.4* 6.2*  HCT 21.4* 20.6*  MCV 84.6  --   PLT 206  --    Basic Metabolic Panel:  Recent Labs Lab 02/26/16 2022  NA 138  K 4.1  CL 107  CO2 25  GLUCOSE 134*  BUN 23*  CREATININE 1.46*  CALCIUM 8.9   GFR: Estimated Creatinine Clearance: 51 mL/min (by C-G formula based on SCr of 1.46 mg/dL (H)). Liver Function Tests:  Recent Labs Lab 02/26/16 2022  AST 19  ALT 10*  ALKPHOS 72  BILITOT 0.5  PROT 7.2  ALBUMIN 3.9   No results for input(s): LIPASE, AMYLASE in the last 168 hours. No results for input(s): AMMONIA in the last 168 hours. Coagulation Profile:  Recent Labs Lab 02/26/16 2022  INR 2.93   Cardiac Enzymes: No results for input(s): CKTOTAL, CKMB, CKMBINDEX, TROPONINI in the last 168 hours. BNP (last 3 results) No results for input(s): PROBNP in the last 8760 hours. HbA1C: No results for input(s): HGBA1C in the last 72 hours. CBG:  Recent Labs Lab 02/27/16 1209  GLUCAP 129*   Lipid Profile: No results for input(s): CHOL, HDL, LDLCALC, TRIG, CHOLHDL, LDLDIRECT in the last 72 hours. Thyroid Function Tests: No results for input(s): TSH, T4TOTAL, FREET4, T3FREE, THYROIDAB in the last 72 hours. Anemia Panel: No results for input(s): VITAMINB12, FOLATE, FERRITIN, TIBC, IRON, RETICCTPCT in the last 72 hours. Sepsis Labs: No results for input(s): PROCALCITON, LATICACIDVEN in the last 168 hours.  No results found for this or any previous visit (from the past 240 hour(s)).       Radiology Studies: No results found.      Scheduled Meds: . pantoprazole (PROTONIX) IV  40 mg Intravenous Q12H  . sodium chloride flush  3 mL Intravenous Q12H   Continuous Infusions:   LOS: 1 day     Time spent: 30 minutes    Loretha Stapler, MD [facility] Pager [phone]  If 7PM-7AM, please contact night-coverage [email or web address] Password [email or web address] 02/27/2016, 1:47 PM

## 2016-02-27 NOTE — ED Notes (Signed)
Pt given meal tray.

## 2016-02-27 NOTE — ED Notes (Signed)
Attempted report. Floor RN will call back.  

## 2016-02-28 LAB — CBC
HCT: 24 % — ABNORMAL LOW (ref 39.0–52.0)
HEMATOCRIT: 25.1 % — AB (ref 39.0–52.0)
HEMATOCRIT: 24.9 % — AB (ref 39.0–52.0)
HEMOGLOBIN: 7.4 g/dL — AB (ref 13.0–17.0)
HEMOGLOBIN: 7.5 g/dL — AB (ref 13.0–17.0)
HEMOGLOBIN: 7.6 g/dL — AB (ref 13.0–17.0)
MCH: 25.9 pg — AB (ref 26.0–34.0)
MCH: 24.7 pg — AB (ref 26.0–34.0)
MCH: 25.5 pg — ABNORMAL LOW (ref 26.0–34.0)
MCHC: 30.8 g/dL (ref 30.0–36.0)
MCHC: 29.9 g/dL — ABNORMAL LOW (ref 30.0–36.0)
MCHC: 30.5 g/dL (ref 30.0–36.0)
MCV: 83.9 fL (ref 78.0–100.0)
MCV: 82.6 fL (ref 78.0–100.0)
MCV: 83.6 fL (ref 78.0–100.0)
Platelets: 168 10*3/uL (ref 150–400)
Platelets: 175 10*3/uL (ref 150–400)
Platelets: 182 10*3/uL (ref 150–400)
RBC: 2.86 MIL/uL — AB (ref 4.22–5.81)
RBC: 3.04 MIL/uL — AB (ref 4.22–5.81)
RBC: 2.98 MIL/uL — ABNORMAL LOW (ref 4.22–5.81)
RDW: 17.2 % — ABNORMAL HIGH (ref 11.5–15.5)
RDW: 17 % — ABNORMAL HIGH (ref 11.5–15.5)
RDW: 17.1 % — ABNORMAL HIGH (ref 11.5–15.5)
WBC: 5.6 10*3/uL (ref 4.0–10.5)
WBC: 5.6 10*3/uL (ref 4.0–10.5)
WBC: 5.8 10*3/uL (ref 4.0–10.5)

## 2016-02-28 LAB — GLUCOSE, CAPILLARY
GLUCOSE-CAPILLARY: 139 mg/dL — AB (ref 65–99)
GLUCOSE-CAPILLARY: 149 mg/dL — AB (ref 65–99)
Glucose-Capillary: 108 mg/dL — ABNORMAL HIGH (ref 65–99)
Glucose-Capillary: 135 mg/dL — ABNORMAL HIGH (ref 65–99)
Glucose-Capillary: 119 mg/dL — ABNORMAL HIGH (ref 65–99)

## 2016-02-28 LAB — BASIC METABOLIC PANEL
ANION GAP: 5 (ref 5–15)
BUN: 14 mg/dL (ref 6–20)
CALCIUM: 8.6 mg/dL — AB (ref 8.9–10.3)
CHLORIDE: 108 mmol/L (ref 101–111)
CO2: 25 mmol/L (ref 22–32)
CREATININE: 1.1 mg/dL (ref 0.61–1.24)
GFR calc Af Amer: >60 mL/min (ref >60)
GFR calc non Af Amer: >60 mL/min (ref >60)
GLUCOSE: 116 mg/dL — AB (ref 65–99)
Potassium: 4.3 mmol/L (ref 3.5–5.1)
Sodium: 138 mmol/L (ref 135–145)

## 2016-02-28 LAB — PREPARE RBC (CROSSMATCH)

## 2016-02-28 LAB — HEMOGLOBIN AND HEMATOCRIT, BLOOD
HCT: 26.8 % — ABNORMAL LOW (ref 39.0–52.0)
Hemoglobin: 8.2 g/dL — ABNORMAL LOW (ref 13.0–17.0)

## 2016-02-28 MED ORDER — INSULIN ASPART 100 UNIT/ML ~~LOC~~ SOLN
0.0000 [IU] | Freq: Every day | SUBCUTANEOUS | Status: DC
  Administered 2016-03-03: 2 [IU] via SUBCUTANEOUS

## 2016-02-28 MED ORDER — FUROSEMIDE 10 MG/ML IJ SOLN
20.0000 mg | Freq: Once | INTRAMUSCULAR | Status: AC
  Administered 2016-02-28: 20 mg via INTRAVENOUS
  Filled 2016-02-28: qty 2

## 2016-02-28 MED ORDER — INSULIN ASPART 100 UNIT/ML ~~LOC~~ SOLN
0.0000 [IU] | Freq: Three times a day (TID) | SUBCUTANEOUS | Status: DC
  Administered 2016-02-28: 2 [IU] via SUBCUTANEOUS
  Administered 2016-03-02: 3 [IU] via SUBCUTANEOUS
  Administered 2016-03-04: 1 [IU] via SUBCUTANEOUS

## 2016-02-28 MED ORDER — SODIUM CHLORIDE 0.9 % IV SOLN
Freq: Once | INTRAVENOUS | Status: AC
  Administered 2016-02-28: 17:00:00 via INTRAVENOUS

## 2016-02-28 MED ORDER — MECLIZINE HCL 25 MG PO TABS: 25.0000 mg | ORAL_TABLET | Freq: Three times a day (TID) | ORAL | Status: DC | PRN

## 2016-02-28 MED ORDER — SODIUM CHLORIDE 0.9 % IV SOLN: Freq: Once | INTRAVENOUS | Status: DC

## 2016-02-28 MED ORDER — FUROSEMIDE 10 MG/ML IJ SOLN: 20.0000 mg | Freq: Once | INTRAMUSCULAR | Status: DC

## 2016-02-28 MED ORDER — FERROUS SULFATE 325 (65 FE) MG PO TABS: 325.0000 mg | ORAL_TABLET | Freq: Two times a day (BID) | ORAL | Status: DC

## 2016-02-28 NOTE — Progress Notes (Signed)
[facility] Progress Note  Subjective: The patient feels somewhat better today but he is still short of breath.  Objective: Vital signs in last 24 hours: Temp:  [97.5 F (36.4 C)-98.6 F (37 C)] 98.3 F (36.8 C) (01/27 0527) Pulse Rate:  [69-75] 69 (01/27 0527) Resp:  [20-24] 20 (01/27 0527) BP: (129-152)/(65-85) 129/68 (01/27 0527) SpO2:  [94 %-100 %] 100 % (01/27 0527) Weight change:    PE:  No distress  Heart irregular rhythm  Lungs clear  Abdomen soft and nontender  Improvement of hemoglobin with blood transfusion noted  Lab Results: Results for orders placed or performed during the hospital encounter of 02/26/16 (from the past 24 hour(s))  Glucose, capillary     Status: Abnormal   Collection Time: 02/27/16  5:00 PM  Result Value Ref Range   Glucose-Capillary 161 (H) 65 - 99 mg/dL  Comprehensive metabolic panel     Status: Abnormal   Collection Time: 02/27/16  5:49 PM  Result Value Ref Range   Sodium 138 135 - 145 mmol/L   Potassium 4.2 3.5 - 5.1 mmol/L   Chloride 106 101 - 111 mmol/L   CO2 25 22 - 32 mmol/L   Glucose, Bld 139 (H) 65 - 99 mg/dL   BUN 17 6 - 20 mg/dL   Creatinine, Ser 1.24 0.61 - 1.24 mg/dL   Calcium 8.6 (L) 8.9 - 10.3 mg/dL   Total Protein 6.8 6.5 - 8.1 g/dL   Albumin 4.0 3.5 - 5.0 g/dL   AST 21 15 - 41 U/L   ALT 11 (L) 17 - 63 U/L   Alkaline Phosphatase 70 38 - 126 U/L   Total Bilirubin 1.2 0.3 - 1.2 mg/dL   GFR calc non Af Amer 53 (L) >60 mL/min   GFR calc Af Amer >60 >60 mL/min   Anion gap 7 5 - 15  CBC with Differential/Platelet     Status: Abnormal   Collection Time: 02/27/16  5:49 PM  Result Value Ref Range   WBC 4.9 4.0 - 10.5 K/uL   RBC 3.22 (L) 4.22 - 5.81 MIL/uL   Hemoglobin 7.8 (L) 13.0 - 17.0 g/dL   HCT 26.8 (L) 39.0 - 52.0 %   MCV 83.2 78.0 - 100.0 fL   MCH 24.2 (L) 26.0 - 34.0 pg   MCHC 29.1 (L) 30.0 - 36.0 g/dL   RDW 16.9 (H) 11.5 - 15.5 %   Platelets 198 150 - 400 K/uL   Neutrophils Relative % 74 %   Neutro Abs 3.6 1.7 - 7.7 K/uL   Lymphocytes Relative 13 %   Lymphs Abs 0.6 (L) 0.7 - 4.0 K/uL   Monocytes Relative 10 %   Monocytes Absolute 0.5 0.1 - 1.0 K/uL   Eosinophils Relative 3 %   Eosinophils Absolute 0.1 0.0 - 0.7 K/uL   Basophils Relative 0 %   Basophils Absolute 0.0 0.0 - 0.1 K/uL  Protime-INR     Status: Abnormal   Collection Time: 02/27/16  5:49 PM  Result Value Ref Range   Prothrombin Time 17.9 (H) 11.4 - 15.2 seconds   INR 1.46   Glucose, capillary     Status: Abnormal   Collection Time: 02/27/16  9:53 PM  Result Value Ref Range   Glucose-Capillary 117 (H) 65 - 99 mg/dL  Basic metabolic panel     Status: Abnormal   Collection Time: 02/28/16  5:53 AM  Result Value Ref Range   Sodium 138 135 - 145 mmol/L   Potassium 4.3  3.5 - 5.1 mmol/L   Chloride 108 101 - 111 mmol/L   CO2 25 22 - 32 mmol/L   Glucose, Bld 116 (H) 65 - 99 mg/dL   BUN 14 6 - 20 mg/dL   Creatinine, Ser 1.10 0.61 - 1.24 mg/dL   Calcium 8.6 (L) 8.9 - 10.3 mg/dL   GFR calc non Af Amer >60 >60 mL/min   GFR calc Af Amer >60 >60 mL/min   Anion gap 5 5 - 15  CBC     Status: Abnormal   Collection Time: 02/28/16  5:53 AM  Result Value Ref Range   WBC 5.6 4.0 - 10.5 K/uL   RBC 2.86 (L) 4.22 - 5.81 MIL/uL   Hemoglobin 7.4 (L) 13.0 - 17.0 g/dL   HCT 24.0 (L) 39.0 - 52.0 %   MCV 83.9 78.0 - 100.0 fL   MCH 25.9 (L) 26.0 - 34.0 pg   MCHC 30.8 30.0 - 36.0 g/dL   RDW 17.2 (H) 11.5 - 15.5 %   Platelets 168 150 - 400 K/uL  Prepare RBC     Status: None   Collection Time: 02/28/16  8:00 AM  Result Value Ref Range   Order Confirmation ORDER PROCESSED BY BLOOD BANK   Hemoglobin and Hematocrit     Status: Abnormal   Collection Time: 02/28/16  8:13 AM  Result Value Ref Range   Hemoglobin 8.2 (L) 13.0 - 17.0 g/dL   HCT 26.8 (L) 39.0 - 52.0 %  Glucose, capillary     Status: Abnormal   Collection Time: 02/28/16  9:26 AM  Result Value Ref Range   Glucose-Capillary 108 (H) 65 - 99 mg/dL  Glucose, capillary      Status: Abnormal   Collection Time: 02/28/16 11:48 AM  Result Value Ref Range   Glucose-Capillary 139 (H) 65 - 99 mg/dL    Studies/Results: No results found.    Assessment: Melena  Anemia  Plan:   We will continue to follow. Defer endoscopy until first of the week. Continue to hold Coumadin.    Cassell Clement 02/28/2016, 2:03 PM  Pager: [phone] If no answer or after 5 PM call [phone] Lab Results  Component Value Date   HGB 8.2 (L) 02/28/2016   HGB 7.4 (L) 02/28/2016   HGB 7.8 (L) 02/27/2016   HCT 26.8 (L) 02/28/2016   HCT 24.0 (L) 02/28/2016   HCT 26.8 (L) 02/27/2016   ALKPHOS 70 02/27/2016   ALKPHOS 72 02/26/2016   ALKPHOS 72 12/16/2015   AST 21 02/27/2016   AST 19 02/26/2016   AST 23 12/16/2015   ALT 11 (L) 02/27/2016   ALT 10 (L) 02/26/2016   ALT 16 (L) 12/16/2015

## 2016-02-28 NOTE — Progress Notes (Signed)
PROGRESS NOTE    [name]  [id number] DOB: 20-Jul-1932 DOA: 02/26/2016 PCP: Wenda Low, MD    Brief Narrative:  Joel [name] is a 83 y.o. male with medical history significant of allergic rhinitis, anemia, anxiety, aortic stenosis, chronic atrial fibrillation, bipolar affective disorder, COPD on home oxygen, diabetic neuropathy, hyperlipidemia, gout, hypertension, prostate cancer, type 2 diabetes who was referred by his PCP to the emergency department after the patient has been feeling progressively worse with fatigue, dyspnea, dizziness and reports melanotic stools for the past week and a half. His hemoglobin level at his PCP office today was 8.0 g/dL. He denies chest pain, abdominal pain, nausea, emesis, diarrhea or constipation. He denies frequency or dysuria.  The patient has a history of anemia and was recently transfused after Thanksgiving. He does not recall being worked up by GI. However, he was seen by Dr. Cristina Gong due to post prostate biopsy hemorrhage in November.  ED Course: The patient will receive fresh frozen plasma, packed RBCs and vitamin K in the emergency department. WBC 5.1, hemoglobin level VI.4 g/dL, platelets 206 and INR 2.93. His CMP shows BUN 23, creatinine 1.46 and glucose of 134 mg/dL.  Patient was ordered for 2 unit PRBC transfusion.  [facility] consulted.  H/H was monitored with serial blood draws.   Assessment & Plan:   Principal Problem:   UGI bleed Active Problems:   DM2 (diabetes mellitus, type 2) (HCC)   Atrial fibrillation (HCC)   COPD mixed type (HCC)   Acute blood loss anemia   UGI bleed Continue fresh frozen plasma and packed RBC transfusion. Can start clear liquid diet per gastroenterology Pantoprazole 40 mg IVP every 12 hours. Monitor hematocrit and hemoglobin. [facility] consulted Two large bore IVs in place Repeat transfusion pending repeat CBC  Acute blood loss anemia H/H slowly trending down Monitor H&H Will transfuse to  keep H/H ~8  DM2 (diabetes mellitus, type 2) (HCC) Currently nothing by mouth. SSI Clear liquid diet  Atrial fibrillation (HCC) CHA2DS2-VASc Score of at least 4 Hold anticoagulation Consider restarting diltiazen if BP and HR stable  COPD mixed type (HCC) Continue supplemental oxygen. Bronchodilators as needed.     DVT prophylaxis: SCDs. Code Status: Full code. Family Communication: patient's friend bedside Disposition Plan: Admit for blood products transfusion and GI evaluation in a.m.   Consultants:   [facility]  Procedures:   None  Antimicrobials:   None    Subjective: Patient feeling well today.  Having significant dizziness with movement which he attributes to not getting his meclizine.  Asking if he can try something to eat today.  Says that he does not feel as weak.  Planning for EGD on 03/01/16.  Objective: Vitals:   02/27/16 2221 02/28/16 0527 02/28/16 1415 02/28/16 1538  BP:  129/68 (!) 120/98 140/62  Pulse: 69 69 74   Resp: 20 20 19    Temp:  98.3 F (36.8 C) 97.7 F (36.5 C)   TempSrc:  Axillary Axillary   SpO2: 100% 100% 97%   Weight:      Height:        Intake/Output Summary (Last 24 hours) at 02/28/16 1607 Last data filed at 02/28/16 1400  Gross per 24 hour  Intake              480 ml  Output              400 ml  Net  80 ml   Filed Weights   02/27/16 1054  Weight: 110.8 kg (244 lb 4.3 oz)    Examination:  General exam: Appears calm and comfortable  Respiratory system: Clear to auscultation. Respiratory effort normal. Cardiovascular system: S1 & S2 heard, irregular rhythm, regular rate. No JVD, murmurs, rubs, gallops or clicks. No pedal edema. Gastrointestinal system: Abdomen is nondistended, soft and nontender. No organomegaly or masses felt. Normal bowel sounds heard. Central nervous system: Alert and oriented. No focal neurological deficits. Extremities: Symmetric 5 x 5 power. Skin: No rashes, lesions or  ulcers Psychiatry: Judgement and insight appear normal. Mood & affect appropriate.     Data Reviewed: I have personally reviewed following labs and imaging studies  CBC:  Recent Labs Lab 02/26/16 2022 02/27/16 0302 02/27/16 1749 02/28/16 0553 02/28/16 0813 02/28/16 1359  WBC 5.1  --  4.9 5.6  --  5.6  NEUTROABS  --   --  3.6  --   --   --   HGB 6.4* 6.2* 7.8* 7.4* 8.2* 7.5*  HCT 21.4* 20.6* 26.8* 24.0* 26.8* 25.1*  MCV 84.6  --  83.2 83.9  --  82.6  PLT 206  --  198 168  --  0000000   Basic Metabolic Panel:  Recent Labs Lab 02/26/16 2022 02/27/16 1749 02/28/16 0553  NA 138 138 138  K 4.1 4.2 4.3  CL 107 106 108  CO2 25 25 25   GLUCOSE 134* 139* 116*  BUN 23* 17 14  CREATININE 1.46* 1.24 1.10  CALCIUM 8.9 8.6* 8.6*   GFR: Estimated Creatinine Clearance: 67.7 mL/min (by C-G formula based on SCr of 1.1 mg/dL). Liver Function Tests:  Recent Labs Lab 02/26/16 2022 02/27/16 1749  AST 19 21  ALT 10* 11*  ALKPHOS 72 70  BILITOT 0.5 1.2  PROT 7.2 6.8  ALBUMIN 3.9 4.0   No results for input(s): LIPASE, AMYLASE in the last 168 hours. No results for input(s): AMMONIA in the last 168 hours. Coagulation Profile:  Recent Labs Lab 02/26/16 2022 02/27/16 1749  INR 2.93 1.46   Cardiac Enzymes: No results for input(s): CKTOTAL, CKMB, CKMBINDEX, TROPONINI in the last 168 hours. BNP (last 3 results) No results for input(s): PROBNP in the last 8760 hours. HbA1C: No results for input(s): HGBA1C in the last 72 hours. CBG:  Recent Labs Lab 02/27/16 1700 02/27/16 2153 02/28/16 0926 02/28/16 1148 02/28/16 1448  GLUCAP 161* 117* 108* 139* 149*   Lipid Profile: No results for input(s): CHOL, HDL, LDLCALC, TRIG, CHOLHDL, LDLDIRECT in the last 72 hours. Thyroid Function Tests: No results for input(s): TSH, T4TOTAL, FREET4, T3FREE, THYROIDAB in the last 72 hours. Anemia Panel: No results for input(s): VITAMINB12, FOLATE, FERRITIN, TIBC, IRON, RETICCTPCT in the last  72 hours. Sepsis Labs: No results for input(s): PROCALCITON, LATICACIDVEN in the last 168 hours.  No results found for this or any previous visit (from the past 240 hour(s)).       Radiology Studies: No results found.      Scheduled Meds: . sodium chloride   Intravenous Once  . sodium chloride   Intravenous Once  . furosemide  20 mg Intravenous Once  . insulin aspart  0-15 Units Subcutaneous TID WC  . insulin aspart  0-5 Units Subcutaneous QHS  . pantoprazole (PROTONIX) IV  40 mg Intravenous Q12H  . sodium chloride flush  3 mL Intravenous Q12H   Continuous Infusions:   LOS: 2 days    Time spent: 30 minutes  Loretha Stapler, MD [facility] Pager [phone]  If 7PM-7AM, please contact night-coverage [email or web address] Password [email or web address] 02/28/2016, 4:07 PM

## 2016-02-29 DIAGNOSIS — H8113 Benign paroxysmal vertigo, bilateral: Secondary | ICD-10-CM

## 2016-02-29 LAB — CBC
HCT: 27 % — ABNORMAL LOW (ref 39.0–52.0)
HCT: 28.3 % — ABNORMAL LOW (ref 39.0–52.0)
HEMATOCRIT: 27.3 % — AB (ref 39.0–52.0)
HEMATOCRIT: 27.2 % — AB (ref 39.0–52.0)
HEMOGLOBIN: 8.5 g/dL — AB (ref 13.0–17.0)
HEMOGLOBIN: 8.7 g/dL — AB (ref 13.0–17.0)
HEMOGLOBIN: 8.4 g/dL — AB (ref 13.0–17.0)
Hemoglobin: 8.6 g/dL — ABNORMAL LOW (ref 13.0–17.0)
MCH: 26.2 pg (ref 26.0–34.0)
MCH: 25.2 pg — ABNORMAL LOW (ref 26.0–34.0)
MCH: 26.6 pg (ref 26.0–34.0)
MCH: 25.8 pg — AB (ref 26.0–34.0)
MCHC: 31.5 g/dL (ref 30.0–36.0)
MCHC: 30.4 g/dL (ref 30.0–36.0)
MCHC: 31.9 g/dL (ref 30.0–36.0)
MCHC: 30.9 g/dL (ref 30.0–36.0)
MCV: 83.3 fL (ref 78.0–100.0)
MCV: 83 fL (ref 78.0–100.0)
MCV: 83.5 fL (ref 78.0–100.0)
MCV: 83.4 fL (ref 78.0–100.0)
PLATELETS: 174 10*3/uL (ref 150–400)
Platelets: 172 10*3/uL (ref 150–400)
Platelets: 168 10*3/uL (ref 150–400)
Platelets: 169 10*3/uL (ref 150–400)
RBC: 3.24 MIL/uL — AB (ref 4.22–5.81)
RBC: 3.41 MIL/uL — AB (ref 4.22–5.81)
RBC: 3.27 MIL/uL — AB (ref 4.22–5.81)
RBC: 3.26 MIL/uL — AB (ref 4.22–5.81)
RDW: 16.3 % — ABNORMAL HIGH (ref 11.5–15.5)
RDW: 16.5 % — ABNORMAL HIGH (ref 11.5–15.5)
RDW: 16.5 % — ABNORMAL HIGH (ref 11.5–15.5)
RDW: 16.4 % — ABNORMAL HIGH (ref 11.5–15.5)
WBC: 4.6 10*3/uL (ref 4.0–10.5)
WBC: 5.3 10*3/uL (ref 4.0–10.5)
WBC: 4.6 10*3/uL (ref 4.0–10.5)
WBC: 4.1 10*3/uL (ref 4.0–10.5)

## 2016-02-29 LAB — GLUCOSE, CAPILLARY
GLUCOSE-CAPILLARY: 96 mg/dL (ref 65–99)
GLUCOSE-CAPILLARY: 167 mg/dL — AB (ref 65–99)
Glucose-Capillary: 108 mg/dL — ABNORMAL HIGH (ref 65–99)
Glucose-Capillary: 116 mg/dL — ABNORMAL HIGH (ref 65–99)

## 2016-02-29 MED ORDER — FUROSEMIDE 20 MG PO TABS
20.0000 mg | ORAL_TABLET | Freq: Every day | ORAL | Status: DC
  Administered 2016-03-01 – 2016-03-04 (×3): 20 mg via ORAL
  Filled 2016-02-29 (×3): qty 1

## 2016-02-29 NOTE — Progress Notes (Signed)
PROGRESS NOTE    Joel [name]  [id number] DOB: 11-27-1932 DOA: 02/26/2016 PCP: Wenda Low, MD    Brief Narrative:  Joel [name] is a 83 y.o. male with medical history significant of allergic rhinitis, anemia, anxiety, aortic stenosis, chronic atrial fibrillation, bipolar affective disorder, COPD on home oxygen, diabetic neuropathy, hyperlipidemia, gout, hypertension, prostate cancer, type 2 diabetes who was referred by his PCP to the emergency department after the patient has been feeling progressively worse with fatigue, dyspnea, dizziness and reports melanotic stools for the past week and a half. His hemoglobin level at his PCP office today was 8.0 g/dL. He denies chest pain, abdominal pain, nausea, emesis, diarrhea or constipation. He denies frequency or dysuria.  The patient has a history of anemia and was recently transfused after Thanksgiving. He does not recall being worked up by GI. However, he was seen by Dr. Cristina Gong due to post prostate biopsy hemorrhage in November.  ED Course: The patient will receive fresh frozen plasma, packed RBCs and vitamin K in the emergency department. WBC 5.1, hemoglobin level VI.4 g/dL, platelets 206 and INR 2.93. His CMP shows BUN 23, creatinine 1.46 and glucose of 134 mg/dL.  Patient was ordered for 2 unit PRBC transfusion.  [facility] consulted.  H/H was monitored with serial blood draws.   Assessment & Plan:   Principal Problem:   UGI bleed Active Problems:   DM2 (diabetes mellitus, type 2) (HCC)   Atrial fibrillation (HCC)   COPD mixed type (HCC)   Acute blood loss anemia   UGI bleed Continue fresh frozen plasma and packed RBC transfusion given at time of admission. Can start clear liquid diet per gastroenterology Pantoprazole 40 mg IVP every 12 hours. Monitor hematocrit and hemoglobin. [facility] consulted Two large bore IVs in place Patient received 2 additional units on 1/27 Continue serial H/H monitoring GI planning on EGD  but would like patient's breathing status to improve- per patient GI is to reach out to his pulmonologist to see what his baseline respiratory status is  Acute blood loss anemia H/H slowly trending down Monitor H&H Will transfuse to keep H/H ~8  DM2 (diabetes mellitus, type 2) ([place]) Currently nothing by mouth. SSI Clear liquid diet  Atrial fibrillation (HCC) CHA2DS2-VASc Score of at least 4 Hold anticoagulation Consider restarting diltiazen if BP and HR stable (can start at lower dose in am)  COPD mixed type (HCC) Continue supplemental oxygen. Bronchodilators as needed.  Vertigo Meclizine restarted     DVT prophylaxis: SCDs. Code Status: Full code. Family Communication: patient's son, daughter in Sports coach and grandson bedside Disposition Plan: pending GI workup and stabilized H/H   Consultants:   [facility]  Procedures:   None  Antimicrobials:   None    Subjective: Patient reports he feels better this am and his dizziness is improved however, he still feels weak.  Mentions that he feels less tired than he did at admission and that he is feeling more and more hungry.  Slept well.  Off oxygen.  Objective: Vitals:   02/29/16 0055 02/29/16 0225 02/29/16 0618 02/29/16 1400  BP:  140/73 (!) 142/75 135/72  Pulse:  75 66 78  Resp: 18 20 20 20   Temp:  98.1 F (36.7 C) 97.9 F (36.6 C) 97.8 F (36.6 C)  TempSrc:  Axillary Oral Oral  SpO2:  97% 98% 98%  Weight:      Height:        Intake/Output Summary (Last 24 hours) at 02/29/16 1421 Last  data filed at 02/29/16 0943  Gross per 24 hour  Intake              918 ml  Output              600 ml  Net              318 ml   Filed Weights   02/27/16 1054  Weight: 110.8 kg (244 lb 4.3 oz)    Examination:  General exam: Appears calm and comfortable  Respiratory system: Clear to auscultation. Respiratory effort normal. Cardiovascular system: S1 & S2 heard, irregular rhythm, regular rate. No JVD, murmurs, rubs,  gallops or clicks. No pedal edema. Gastrointestinal system: Abdomen is nondistended, soft and nontender. No organomegaly or masses felt. Normal bowel sounds heard. Central nervous system: Alert and oriented. No focal neurological deficits. Extremities: Symmetric 5 x 5 power. Skin: No rashes, lesions or ulcers Psychiatry: Judgement and insight appear normal. Mood & affect appropriate.     Data Reviewed: I have personally reviewed following labs and imaging studies  CBC:  Recent Labs Lab 02/27/16 1749 02/28/16 0553 02/28/16 0813 02/28/16 1359 02/28/16 1641 02/29/16 0529 02/29/16 1016  WBC 4.9 5.6  --  5.6 5.8 4.6 5.3  NEUTROABS 3.6  --   --   --   --   --   --   HGB 7.8* 7.4* 8.2* 7.5* 7.6* 8.5* 8.6*  HCT 26.8* 24.0* 26.8* 25.1* 24.9* 27.0* 28.3*  MCV 83.2 83.9  --  82.6 83.6 83.3 83.0  PLT 198 168  --  175 182 172 [id number]   Basic Metabolic Panel:  Recent Labs Lab 02/26/16 2022 02/27/16 1749 02/28/16 0553  NA 138 138 138  K 4.1 4.2 4.3  CL 107 106 108  CO2 25 25 25   GLUCOSE 134* 139* 116*  BUN 23* 17 14  CREATININE 1.46* 1.24 1.10  CALCIUM 8.9 8.6* 8.6*   GFR: Estimated Creatinine Clearance: 67.7 mL/min (by C-G formula based on SCr of 1.1 mg/dL). Liver Function Tests:  Recent Labs Lab 02/26/16 2022 02/27/16 1749  AST 19 21  ALT 10* 11*  ALKPHOS 72 70  BILITOT 0.5 1.2  PROT 7.2 6.8  ALBUMIN 3.9 4.0   No results for input(s): LIPASE, AMYLASE in the last 168 hours. No results for input(s): AMMONIA in the last 168 hours. Coagulation Profile:  Recent Labs Lab 02/26/16 2022 02/27/16 1749  INR 2.93 1.46   Cardiac Enzymes: No results for input(s): CKTOTAL, CKMB, CKMBINDEX, TROPONINI in the last 168 hours. BNP (last 3 results) No results for input(s): PROBNP in the last 8760 hours. HbA1C: No results for input(s): HGBA1C in the last 72 hours. CBG:  Recent Labs Lab 02/28/16 1448 02/28/16 1734 02/28/16 2152 02/29/16 0745 02/29/16 1155  GLUCAP 149*  135* 119* 96 108*   Lipid Profile: No results for input(s): CHOL, HDL, LDLCALC, TRIG, CHOLHDL, LDLDIRECT in the last 72 hours. Thyroid Function Tests: No results for input(s): TSH, T4TOTAL, FREET4, T3FREE, THYROIDAB in the last 72 hours. Anemia Panel: No results for input(s): VITAMINB12, FOLATE, FERRITIN, TIBC, IRON, RETICCTPCT in the last 72 hours. Sepsis Labs: No results for input(s): PROCALCITON, LATICACIDVEN in the last 168 hours.  No results found for this or any previous visit (from the past 240 hour(s)).       Radiology Studies: No results found.      Scheduled Meds: . sodium chloride   Intravenous Once  . insulin aspart  0-15 Units Subcutaneous TID WC  .  insulin aspart  0-5 Units Subcutaneous QHS  . pantoprazole (PROTONIX) IV  40 mg Intravenous Q12H  . sodium chloride flush  3 mL Intravenous Q12H   Continuous Infusions:   LOS: 3 days    Time spent: 30 minutes    Loretha Stapler, MD [facility] Pager [phone]  If 7PM-7AM, please contact night-coverage [email or web address] Password [email or web address] 02/29/2016, 2:21 PM

## 2016-02-29 NOTE — Progress Notes (Signed)
[facility] Progress Note  Subjective: The patient complains of getting a little dizzy when he gets up. He complains of being short of breath and the need for wearing oxygen.    Objective: Vital signs in last 24 hours: Temp:  [97.6 F (36.4 C)-98.5 F (36.9 C)] 97.9 F (36.6 C) (01/28 0618) Pulse Rate:  [66-92] 66 (01/28 0618) Resp:  [15-20] 20 (01/28 0618) BP: (100-146)/(62-98) 142/75 (01/28 0618) SpO2:  [97 %-99 %] 98 % (01/28 0618) Weight change:    PE:  No distress  Heart irregular  Lungs clear  Abdomen soft nontender  Lab Results: Results for orders placed or performed during the hospital encounter of 02/26/16 (from the past 24 hour(s))  CBC     Status: Abnormal   Collection Time: 02/28/16  1:59 PM  Result Value Ref Range   WBC 5.6 4.0 - 10.5 K/uL   RBC 3.04 (L) 4.22 - 5.81 MIL/uL   Hemoglobin 7.5 (L) 13.0 - 17.0 g/dL   HCT 25.1 (L) 39.0 - 52.0 %   MCV 82.6 78.0 - 100.0 fL   MCH 24.7 (L) 26.0 - 34.0 pg   MCHC 29.9 (L) 30.0 - 36.0 g/dL   RDW 17.0 (H) 11.5 - 15.5 %   Platelets 175 150 - 400 K/uL  Glucose, capillary     Status: Abnormal   Collection Time: 02/28/16  2:48 PM  Result Value Ref Range   Glucose-Capillary 149 (H) 65 - 99 mg/dL  CBC     Status: Abnormal   Collection Time: 02/28/16  4:41 PM  Result Value Ref Range   WBC 5.8 4.0 - 10.5 K/uL   RBC 2.98 (L) 4.22 - 5.81 MIL/uL   Hemoglobin 7.6 (L) 13.0 - 17.0 g/dL   HCT 24.9 (L) 39.0 - 52.0 %   MCV 83.6 78.0 - 100.0 fL   MCH 25.5 (L) 26.0 - 34.0 pg   MCHC 30.5 30.0 - 36.0 g/dL   RDW 17.1 (H) 11.5 - 15.5 %   Platelets 182 150 - 400 K/uL  Prepare RBC     Status: None   Collection Time: 02/28/16  4:48 PM  Result Value Ref Range   Order Confirmation ORDER PROCESSED BY BLOOD BANK   Glucose, capillary     Status: Abnormal   Collection Time: 02/28/16  5:34 PM  Result Value Ref Range   Glucose-Capillary 135 (H) 65 - 99 mg/dL  Glucose, capillary     Status: Abnormal   Collection Time:  02/28/16  9:52 PM  Result Value Ref Range   Glucose-Capillary 119 (H) 65 - 99 mg/dL  CBC     Status: Abnormal   Collection Time: 02/29/16  5:29 AM  Result Value Ref Range   WBC 4.6 4.0 - 10.5 K/uL   RBC 3.24 (L) 4.22 - 5.81 MIL/uL   Hemoglobin 8.5 (L) 13.0 - 17.0 g/dL   HCT 27.0 (L) 39.0 - 52.0 %   MCV 83.3 78.0 - 100.0 fL   MCH 26.2 26.0 - 34.0 pg   MCHC 31.5 30.0 - 36.0 g/dL   RDW 16.3 (H) 11.5 - 15.5 %   Platelets 172 150 - 400 K/uL  Glucose, capillary     Status: None   Collection Time: 02/29/16  7:45 AM  Result Value Ref Range   Glucose-Capillary 96 65 - 99 mg/dL  CBC     Status: Abnormal   Collection Time: 02/29/16 10:16 AM  Result Value Ref Range   WBC 5.3 4.0 - 10.5 K/uL  RBC 3.41 (L) 4.22 - 5.81 MIL/uL   Hemoglobin 8.6 (L) 13.0 - 17.0 g/dL   HCT 28.3 (L) 39.0 - 52.0 %   MCV 83.0 78.0 - 100.0 fL   MCH 25.2 (L) 26.0 - 34.0 pg   MCHC 30.4 30.0 - 36.0 g/dL   RDW 16.5 (H) 11.5 - 15.5 %   Platelets 174 150 - 400 K/uL  Glucose, capillary     Status: Abnormal   Collection Time: 02/29/16 11:55 AM  Result Value Ref Range   Glucose-Capillary 108 (H) 65 - 99 mg/dL    Studies/Results: No results found.    Assessment: Gastrointestinal bleeding characterized by chronic melena  Anemia  Plan:   He is still feeling short of breath and somewhat dizzy when he gets up. At some point I think we should do an EGD, but I would like to get his pulmonary status better before sedation. We will continue to follow clinically. He does have a pulmonologist Dr. Baird Lyons who takes care of his pulmonary issues.    Cassell Clement 02/29/2016, 12:35 PM  Pager: [phone] If no answer or after 5 PM call [phone] Lab Results  Component Value Date   HGB 8.6 (L) 02/29/2016   HGB 8.5 (L) 02/29/2016   HGB 7.6 (L) 02/28/2016   HCT 28.3 (L) 02/29/2016   HCT 27.0 (L) 02/29/2016   HCT 24.9 (L) 02/28/2016   ALKPHOS 70 02/27/2016   ALKPHOS 72 02/26/2016   ALKPHOS 72 12/16/2015    AST 21 02/27/2016   AST 19 02/26/2016   AST 23 12/16/2015   ALT 11 (L) 02/27/2016   ALT 10 (L) 02/26/2016   ALT 16 (L) 12/16/2015

## 2016-02-29 NOTE — Plan of Care (Signed)
Problem: Bowel/Gastric: Goal: Will show no signs and symptoms of gastrointestinal bleeding Outcome: Progressing Patient has not had any bloody stool.

## 2016-03-01 ENCOUNTER — Ambulatory Visit: Payer: PPO | Admitting: Podiatry

## 2016-03-01 LAB — TYPE AND SCREEN
BLOOD PRODUCT EXPIRATION DATE: 201802132359
BLOOD PRODUCT EXPIRATION DATE: 201802232359
Blood Product Expiration Date: 201802142359
Blood Product Expiration Date: 201802232359
ISSUE DATE / TIME: 201801260803
ISSUE DATE / TIME: 201801271815
ISSUE DATE / TIME: 201801261137
ISSUE DATE / TIME: 201801272334
UNIT TYPE AND RH: 5100
Unit Type and Rh: 5100
Unit Type and Rh: 5100
Unit Type and Rh: 5100

## 2016-03-01 LAB — GLUCOSE, CAPILLARY
Glucose-Capillary: 117 mg/dL — ABNORMAL HIGH (ref 65–99)
Glucose-Capillary: 97 mg/dL (ref 65–99)
Glucose-Capillary: 116 mg/dL — ABNORMAL HIGH (ref 65–99)
Glucose-Capillary: 176 mg/dL — ABNORMAL HIGH (ref 65–99)

## 2016-03-01 LAB — PREPARE FRESH FROZEN PLASMA
BLOOD PRODUCT EXPIRATION DATE: 201801312359
ISSUE DATE / TIME: 201801261434
Unit Type and Rh: 5100

## 2016-03-01 LAB — CBC
HCT: 29 % — ABNORMAL LOW (ref 39.0–52.0)
HEMATOCRIT: 28.9 % — AB (ref 39.0–52.0)
HEMOGLOBIN: 8.9 g/dL — AB (ref 13.0–17.0)
Hemoglobin: 8.9 g/dL — ABNORMAL LOW (ref 13.0–17.0)
MCH: 25.9 pg — AB (ref 26.0–34.0)
MCH: 25.9 pg — AB (ref 26.0–34.0)
MCHC: 30.7 g/dL (ref 30.0–36.0)
MCHC: 30.8 g/dL (ref 30.0–36.0)
MCV: 84.3 fL (ref 78.0–100.0)
MCV: 84.3 fL (ref 78.0–100.0)
Platelets: 180 10*3/uL (ref 150–400)
Platelets: 180 10*3/uL (ref 150–400)
RBC: 3.44 MIL/uL — AB (ref 4.22–5.81)
RBC: 3.43 MIL/uL — AB (ref 4.22–5.81)
RDW: 16.5 % — ABNORMAL HIGH (ref 11.5–15.5)
RDW: 16.4 % — AB (ref 11.5–15.5)
WBC: 3.5 10*3/uL — ABNORMAL LOW (ref 4.0–10.5)
WBC: 3.6 10*3/uL — AB (ref 4.0–10.5)

## 2016-03-01 NOTE — Progress Notes (Signed)
PROGRESS NOTE    Joel [name]  [id number] DOB: 02-03-1932 DOA: 02/26/2016 PCP: Wenda Low, MD    Brief Narrative:  Joel [name] is a 83 y.o. male with medical history significant of allergic rhinitis, anemia, anxiety, aortic stenosis, chronic atrial fibrillation, bipolar affective disorder, COPD on home oxygen, diabetic neuropathy, hyperlipidemia, gout, hypertension, prostate cancer, type 2 diabetes who was referred by his PCP to the emergency department after the patient has been feeling progressively worse with fatigue, dyspnea, dizziness and reports melanotic stools for the past week and a half. His hemoglobin level at his PCP office today was 8.0 g/dL. He denies chest pain, abdominal pain, nausea, emesis, diarrhea or constipation. He denies frequency or dysuria.  The patient has a history of anemia and was recently transfused after Thanksgiving. He does not recall being worked up by GI. However, he was seen by Dr. Cristina Gong due to post prostate biopsy hemorrhage in November.  ED Course: The patient will receive fresh frozen plasma, packed RBCs and vitamin K in the emergency department. WBC 5.1, hemoglobin level VI.4 g/dL, platelets 206 and INR 2.93. His CMP shows BUN 23, creatinine 1.46 and glucose of 134 mg/dL.  Patient was ordered for 2 unit PRBC transfusion.  [facility] consulted.  H/H was monitored with serial blood draws.  Patient to get pulmonary clearance prior to EGD.   Assessment & Plan:   Principal Problem:   UGI bleed Active Problems:   DM2 (diabetes mellitus, type 2) (HCC)   Atrial fibrillation (HCC)   COPD mixed type (HCC)   Acute blood loss anemia   UGI bleed Continue fresh frozen plasma and packed RBC transfusion given at time of admission. Can start clear liquid diet per gastroenterology Pantoprazole 40 mg IVP every 12 hours. Monitor hematocrit and hemoglobin- stabilized [facility] consulted Two large bore IVs in place Patient received 2 additional units  on 1/27 Space out H/H monitoring Will consult pulmonology for clearance prior to EGD  Acute blood loss anemia H/H slowly trending down Monitor H&H Will transfuse to keep H/H ~8 Repeat CBC in am  DM2 (diabetes mellitus, type 2) (HCC) Full liquid diet SSI  Atrial fibrillation (HCC) CHA2DS2-VASc Score of at least 4 Hold anticoagulation Consider restarting diltiazen if BP and HR stable (can start at lower dose in am)  COPD mixed type (HCC) Continue supplemental oxygen. Bronchodilators as needed. Patient currently on room air  Vertigo Meclizine restarted   DVT prophylaxis: SCDs. Code Status: Full code. Family Communication: three family members present bedside Disposition Plan: pending EGD and stabilized H/H   Consultants:   [facility]  Procedures:   None  Antimicrobials:   None    Subjective: Patient sitting up talking with family.  Says he feels great.  Denies shortness of breath or increased work of breathing.  Says his dizziness is better.  Feels tired getting up to walk to the bathroom.  No melanotic stools since admission.  Objective: Vitals:   02/29/16 2054 02/29/16 2204 03/01/16 0452 03/01/16 1300  BP: 126/87  135/66 129/64  Pulse: 88  72 71  Resp: (!) 22 18 20 20   Temp: 97.8 F (36.6 C)  97.6 F (36.4 C) 97.7 F (36.5 C)  TempSrc: Oral  Oral Oral  SpO2: 96%  99% 97%  Weight:      Height:        Intake/Output Summary (Last 24 hours) at 03/01/16 1655 Last data filed at 03/01/16 1000  Gross per 24 hour  Intake  240 ml  Output                0 ml  Net              240 ml   Filed Weights   02/27/16 1054  Weight: 110.8 kg (244 lb 4.3 oz)    Examination:  General exam: Appears calm and comfortable  Respiratory system: Clear to auscultation. Respiratory effort normal. Cardiovascular system: S1 & S2 heard, irregular rhythm, regular rate. No JVD, murmurs, rubs, gallops or clicks. No pedal edema. Gastrointestinal system:  Abdomen is nondistended, soft and nontender. No organomegaly or masses felt. Normal bowel sounds heard. Central nervous system: Alert and oriented. No focal neurological deficits. Extremities: Symmetric 5 x 5 power. Skin: No rashes, lesions or ulcers Psychiatry: Judgement and insight appear normal. Mood & affect appropriate.     Data Reviewed: I have personally reviewed following labs and imaging studies  CBC:  Recent Labs Lab 02/27/16 1749  02/29/16 1016 02/29/16 1619 02/29/16 2226 03/01/16 0444 03/01/16 0941  WBC 4.9  < > 5.3 4.6 4.1 3.5* 3.6*  NEUTROABS 3.6  --   --   --   --   --   --   HGB 7.8*  < > 8.6* 8.7* 8.4* 8.9* 8.9*  HCT 26.8*  < > 28.3* 27.3* 27.2* 29.0* 28.9*  MCV 83.2  < > 83.0 83.5 83.4 84.3 84.3  PLT 198  < > 174 168 169 180 180  < > = values in this interval not displayed. Basic Metabolic Panel:  Recent Labs Lab 02/26/16 2022 02/27/16 1749 02/28/16 0553  NA 138 138 138  K 4.1 4.2 4.3  CL 107 106 108  CO2 25 25 25   GLUCOSE 134* 139* 116*  BUN 23* 17 14  CREATININE 1.46* 1.24 1.10  CALCIUM 8.9 8.6* 8.6*   GFR: Estimated Creatinine Clearance: 67.7 mL/min (by C-G formula based on SCr of 1.1 mg/dL). Liver Function Tests:  Recent Labs Lab 02/26/16 2022 02/27/16 1749  AST 19 21  ALT 10* 11*  ALKPHOS 72 70  BILITOT 0.5 1.2  PROT 7.2 6.8  ALBUMIN 3.9 4.0   No results for input(s): LIPASE, AMYLASE in the last 168 hours. No results for input(s): AMMONIA in the last 168 hours. Coagulation Profile:  Recent Labs Lab 02/26/16 2022 02/27/16 1749  INR 2.93 1.46   Cardiac Enzymes: No results for input(s): CKTOTAL, CKMB, CKMBINDEX, TROPONINI in the last 168 hours. BNP (last 3 results) No results for input(s): PROBNP in the last 8760 hours. HbA1C: No results for input(s): HGBA1C in the last 72 hours. CBG:  Recent Labs Lab 02/29/16 1155 02/29/16 1742 02/29/16 2143 03/01/16 0752 03/01/16 1141  GLUCAP 108* 116* 167* 117* 97   Lipid  Profile: No results for input(s): CHOL, HDL, LDLCALC, TRIG, CHOLHDL, LDLDIRECT in the last 72 hours. Thyroid Function Tests: No results for input(s): TSH, T4TOTAL, FREET4, T3FREE, THYROIDAB in the last 72 hours. Anemia Panel: No results for input(s): VITAMINB12, FOLATE, FERRITIN, TIBC, IRON, RETICCTPCT in the last 72 hours. Sepsis Labs: No results for input(s): PROCALCITON, LATICACIDVEN in the last 168 hours.  No results found for this or any previous visit (from the past 240 hour(s)).       Radiology Studies: No results found.      Scheduled Meds: . sodium chloride   Intravenous Once  . furosemide  20 mg Oral Daily  . insulin aspart  0-15 Units Subcutaneous TID WC  . insulin aspart  0-5 Units Subcutaneous QHS  . pantoprazole (PROTONIX) IV  40 mg Intravenous Q12H  . sodium chloride flush  3 mL Intravenous Q12H   Continuous Infusions:   LOS: 4 days    Time spent: 30 minutes    Loretha Stapler, MD [facility] Pager [phone]  If 7PM-7AM, please contact night-coverage [email or web address] Password [email or web address] 03/01/2016, 4:55 PM

## 2016-03-01 NOTE — Progress Notes (Signed)
Placed patient on CPAP per previous settings via [facility] (home mask) with 2 lpm O2 bleed in. Distilled H2O added to chamber for humidity.

## 2016-03-01 NOTE — Progress Notes (Signed)
[facility] Progress Note  Subjective: Feels okay. No stool since admission  Objective: Vital signs in last 24 hours: Temp:  [97.6 F (36.4 C)-97.8 F (36.6 C)] 97.6 F (36.4 C) (01/29 0452) Pulse Rate:  [72-88] 72 (01/29 0452) Resp:  [18-22] 20 (01/29 0452) BP: (126-135)/(66-87) 135/66 (01/29 0452) SpO2:  [96 %-99 %] 99 % (01/29 0452) Weight change:    PE: Unchanged  Lab Results: Results for orders placed or performed during the hospital encounter of 02/26/16 (from the past 24 hour(s))  Glucose, capillary     Status: None   Collection Time: 02/29/16  7:45 AM  Result Value Ref Range   Glucose-Capillary 96 65 - 99 mg/dL  CBC     Status: Abnormal   Collection Time: 02/29/16 10:16 AM  Result Value Ref Range   WBC 5.3 4.0 - 10.5 K/uL   RBC 3.41 (L) 4.22 - 5.81 MIL/uL   Hemoglobin 8.6 (L) 13.0 - 17.0 g/dL   HCT 28.3 (L) 39.0 - 52.0 %   MCV 83.0 78.0 - 100.0 fL   MCH 25.2 (L) 26.0 - 34.0 pg   MCHC 30.4 30.0 - 36.0 g/dL   RDW 16.5 (H) 11.5 - 15.5 %   Platelets 174 150 - 400 K/uL  Glucose, capillary     Status: Abnormal   Collection Time: 02/29/16 11:55 AM  Result Value Ref Range   Glucose-Capillary 108 (H) 65 - 99 mg/dL  CBC     Status: Abnormal   Collection Time: 02/29/16  4:19 PM  Result Value Ref Range   WBC 4.6 4.0 - 10.5 K/uL   RBC 3.27 (L) 4.22 - 5.81 MIL/uL   Hemoglobin 8.7 (L) 13.0 - 17.0 g/dL   HCT 27.3 (L) 39.0 - 52.0 %   MCV 83.5 78.0 - 100.0 fL   MCH 26.6 26.0 - 34.0 pg   MCHC 31.9 30.0 - 36.0 g/dL   RDW 16.5 (H) 11.5 - 15.5 %   Platelets 168 150 - 400 K/uL  Glucose, capillary     Status: Abnormal   Collection Time: 02/29/16  5:42 PM  Result Value Ref Range   Glucose-Capillary 116 (H) 65 - 99 mg/dL  Glucose, capillary     Status: Abnormal   Collection Time: 02/29/16  9:43 PM  Result Value Ref Range   Glucose-Capillary 167 (H) 65 - 99 mg/dL  CBC     Status: Abnormal   Collection Time: 02/29/16 10:26 PM  Result Value Ref Range   WBC 4.1  4.0 - 10.5 K/uL   RBC 3.26 (L) 4.22 - 5.81 MIL/uL   Hemoglobin 8.4 (L) 13.0 - 17.0 g/dL   HCT 27.2 (L) 39.0 - 52.0 %   MCV 83.4 78.0 - 100.0 fL   MCH 25.8 (L) 26.0 - 34.0 pg   MCHC 30.9 30.0 - 36.0 g/dL   RDW 16.4 (H) 11.5 - 15.5 %   Platelets 169 150 - 400 K/uL  CBC     Status: Abnormal   Collection Time: 03/01/16  4:44 AM  Result Value Ref Range   WBC 3.5 (L) 4.0 - 10.5 K/uL   RBC 3.44 (L) 4.22 - 5.81 MIL/uL   Hemoglobin 8.9 (L) 13.0 - 17.0 g/dL   HCT 29.0 (L) 39.0 - 52.0 %   MCV 84.3 78.0 - 100.0 fL   MCH 25.9 (L) 26.0 - 34.0 pg   MCHC 30.7 30.0 - 36.0 g/dL   RDW 16.5 (H) 11.5 - 15.5 %   Platelets 180 150 -  400 K/uL    Studies/Results: No results found.    Assessment: Melena with stable hemoglobin, on Coumadin, no significant further bleeding since hospitalization.  Plan: Needs EGD before restarting Coumadin, would ideally like to have pulmonary clearance before scheduling.    Joel [name] C 03/01/2016, 7:18 AM  Pager [phone] If no answer or after 5 PM call [phone]

## 2016-03-01 NOTE — Care Management Important Message (Signed)
Important Message  Patient Details  Name: Joel [name] MRN: [id number] Date of Birth: 03-18-1932   Medicare Important Message Given:  Yes    Kerin Salen 03/01/2016, 4:39 [place] Message  Patient Details  Name: Joel [name] MRN: [id number] Date of Birth: August 30, 1932   Medicare Important Message Given:  Yes    Kerin Salen 03/01/2016, 4:39 PM

## 2016-03-02 DIAGNOSIS — G4733 Obstructive sleep apnea (adult) (pediatric): Secondary | ICD-10-CM

## 2016-03-02 DIAGNOSIS — K922 Gastrointestinal hemorrhage, unspecified: Secondary | ICD-10-CM

## 2016-03-02 DIAGNOSIS — Z01811 Encounter for preprocedural respiratory examination: Secondary | ICD-10-CM

## 2016-03-02 DIAGNOSIS — G4734 Idiopathic sleep related nonobstructive alveolar hypoventilation: Secondary | ICD-10-CM

## 2016-03-02 DIAGNOSIS — J449 Chronic obstructive pulmonary disease, unspecified: Secondary | ICD-10-CM

## 2016-03-02 DIAGNOSIS — D62 Acute posthemorrhagic anemia: Secondary | ICD-10-CM

## 2016-03-02 LAB — CBC
HCT: 27.9 % — ABNORMAL LOW (ref 39.0–52.0)
Hemoglobin: 8.6 g/dL — ABNORMAL LOW (ref 13.0–17.0)
MCH: 25.7 pg — ABNORMAL LOW (ref 26.0–34.0)
MCHC: 30.8 g/dL (ref 30.0–36.0)
MCV: 83.3 fL (ref 78.0–100.0)
PLATELETS: 186 10*3/uL (ref 150–400)
RBC: 3.35 MIL/uL — ABNORMAL LOW (ref 4.22–5.81)
RDW: 16.4 % — AB (ref 11.5–15.5)
WBC: 4.5 10*3/uL (ref 4.0–10.5)

## 2016-03-02 LAB — GLUCOSE, CAPILLARY
GLUCOSE-CAPILLARY: 178 mg/dL — AB (ref 65–99)
Glucose-Capillary: 92 mg/dL (ref 65–99)
Glucose-Capillary: 103 mg/dL — ABNORMAL HIGH (ref 65–99)
Glucose-Capillary: 181 mg/dL — ABNORMAL HIGH (ref 65–99)

## 2016-03-02 NOTE — Procedures (Signed)
Placed patient on BIPAP for the night.  Patient is tolerating well at this time. 
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
## 2016-03-02 NOTE — Progress Notes (Signed)
PROGRESS NOTE    Joel [name]  [id number] DOB: 06/01/1932 DOA: 02/26/2016 PCP: Wenda Low, MD    Brief Narrative:  Joel [name] is a 84 y.o. male with medical history significant of allergic rhinitis, anemia, anxiety, aortic stenosis, chronic atrial fibrillation, bipolar affective disorder, COPD on home oxygen, diabetic neuropathy, hyperlipidemia, gout, hypertension, prostate cancer, type 2 diabetes who was referred by his PCP to the emergency department after the patient has been feeling progressively worse with fatigue, dyspnea, dizziness and reports melanotic stools for the past week and a half. His hemoglobin level at his PCP office today was 8.0 g/dL. He denies chest pain, abdominal pain, nausea, emesis, diarrhea or constipation. He denies frequency or dysuria.  The patient has a history of anemia and was recently transfused after Thanksgiving. He does not recall being worked up by GI. However, he was seen by Dr. Cristina Gong due to post prostate biopsy hemorrhage in November.  ED Course: The patient will receive fresh frozen plasma, packed RBCs and vitamin K in the emergency department. WBC 5.1, hemoglobin level VI.4 g/dL, platelets 206 and INR 2.93. His CMP shows BUN 23, creatinine 1.46 and glucose of 134 mg/dL.  Patient was ordered for 2 unit PRBC transfusion.  [facility] consulted.  H/H was monitored with serial blood draws.  Patient to get pulmonary clearance prior to EGD- discussed with Pulmonology.   Assessment & Plan:   Principal Problem:   UGI bleed Active Problems:   DM2 (diabetes mellitus, type 2) (HCC)   Atrial fibrillation (HCC)   COPD mixed type (HCC)   Acute blood loss anemia   UGI bleed Continue fresh frozen plasma and packed RBC transfusion given at time of admission. Can start clear liquid diet per gastroenterology Pantoprazole 40 mg IVP every 12 hours. Monitor hematocrit and hemoglobin- stabilized [facility] consulted Two large bore IVs in place Patient  received 2 additional units on 1/27 Space out H/H monitoring Discussed with pulmonology for clearance prior to EGD- Pulmonology to see patient  Acute blood loss anemia H/H slowly trending down Monitor H&H Will transfuse to keep H/H ~8 H/H stable EGD pending  DM2 (diabetes mellitus, type 2) (HCC) Heart healthy diet ordered SSI  Atrial fibrillation (HCC) CHA2DS2-VASc Score of at least 4 Hold anticoagulation Heart rate controlled  COPD mixed type ([place]) Continue supplemental oxygen. Bronchodilators as needed. Patient currently on room air Pulmonology consulted for clearance for EGD  Vertigo Meclizine restarted   DVT prophylaxis: SCDs. Code Status: Full code. Family Communication: three family members present bedside Disposition Plan: pending EGD and stabilized H/H   Consultants:  [facility]  Procedures:   None  Antimicrobials:   None    Subjective: Patient laying in bed.  Asking if he can eat anything today.  Discussed that he thought he was getting his procedure today.  Voices that he is hungry.  No hematochezia.  Objective: Vitals:   03/01/16 2159 03/01/16 2300 03/02/16 0558 03/02/16 0800  BP: (!) 145/69  (!) 141/68 (!) 149/74  Pulse: 74 72 70 61  Resp: 18 18 17 18   Temp: 97.8 F (36.6 C)  97.9 F (36.6 C) 97.6 F (36.4 C)  TempSrc: Oral  Oral Axillary  SpO2: 100% 99% 96% 96%  Weight:      Height:        Intake/Output Summary (Last 24 hours) at 03/02/16 1106 Last data filed at 03/01/16 1400  Gross per 24 hour  Intake  240 ml  Output                0 ml  Net              240 ml   Filed Weights   02/27/16 1054  Weight: 110.8 kg (244 lb 4.3 oz)    Examination:  General exam: Appears calm and comfortable  Respiratory system: Clear to auscultation. Respiratory effort normal. Cardiovascular system: S1 & S2 heard, irregular rhythm, regular rate. No JVD, murmurs, rubs, gallops or clicks. No pedal  edema. Gastrointestinal system: Abdomen is nondistended, soft and nontender. No organomegaly or masses felt. Normal bowel sounds heard. Central nervous system: Alert and oriented. No focal neurological deficits. Extremities: Symmetric 5 x 5 power. Skin: No rashes, lesions or ulcers Psychiatry: Judgement and insight appear normal. Mood & affect appropriate.     Data Reviewed: I have personally reviewed following labs and imaging studies  CBC:  Recent Labs Lab 02/27/16 1749  02/29/16 1619 02/29/16 2226 03/01/16 0444 03/01/16 0941 03/02/16 0511  WBC 4.9  < > 4.6 4.1 3.5* 3.6* 4.5  NEUTROABS 3.6  --   --   --   --   --   --   HGB 7.8*  < > 8.7* 8.4* 8.9* 8.9* 8.6*  HCT 26.8*  < > 27.3* 27.2* 29.0* 28.9* 27.9*  MCV 83.2  < > 83.5 83.4 84.3 84.3 83.3  PLT 198  < > 168 169 180 180 186  < > = values in this interval not displayed. Basic Metabolic Panel:  Recent Labs Lab 02/26/16 2022 02/27/16 1749 02/28/16 0553  NA 138 138 138  K 4.1 4.2 4.3  CL 107 106 108  CO2 25 25 25   GLUCOSE 134* 139* 116*  BUN 23* 17 14  CREATININE 1.46* 1.24 1.10  CALCIUM 8.9 8.6* 8.6*   GFR: Estimated Creatinine Clearance: 67.7 mL/min (by C-G formula based on SCr of 1.1 mg/dL). Liver Function Tests:  Recent Labs Lab 02/26/16 2022 02/27/16 1749  AST 19 21  ALT 10* 11*  ALKPHOS 72 70  BILITOT 0.5 1.2  PROT 7.2 6.8  ALBUMIN 3.9 4.0   No results for input(s): LIPASE, AMYLASE in the last 168 hours. No results for input(s): AMMONIA in the last 168 hours. Coagulation Profile:  Recent Labs Lab 02/26/16 2022 02/27/16 1749  INR 2.93 1.46   Cardiac Enzymes: No results for input(s): CKTOTAL, CKMB, CKMBINDEX, TROPONINI in the last 168 hours. BNP (last 3 results) No results for input(s): PROBNP in the last 8760 hours. HbA1C: No results for input(s): HGBA1C in the last 72 hours. CBG:  Recent Labs Lab 03/01/16 0752 03/01/16 1141 03/01/16 1659 03/01/16 2157 03/02/16 0752  GLUCAP 117*  97 116* 176* 92   Lipid Profile: No results for input(s): CHOL, HDL, LDLCALC, TRIG, CHOLHDL, LDLDIRECT in the last 72 hours. Thyroid Function Tests: No results for input(s): TSH, T4TOTAL, FREET4, T3FREE, THYROIDAB in the last 72 hours. Anemia Panel: No results for input(s): VITAMINB12, FOLATE, FERRITIN, TIBC, IRON, RETICCTPCT in the last 72 hours. Sepsis Labs: No results for input(s): PROCALCITON, LATICACIDVEN in the last 168 hours.  No results found for this or any previous visit (from the past 240 hour(s)).       Radiology Studies: No results found.      Scheduled Meds: . sodium chloride   Intravenous Once  . furosemide  20 mg Oral Daily  . insulin aspart  0-15 Units Subcutaneous TID WC  . insulin aspart  0-5 Units Subcutaneous QHS  . pantoprazole (PROTONIX) IV  40 mg Intravenous Q12H  . sodium chloride flush  3 mL Intravenous Q12H   Continuous Infusions:   LOS: 5 days    Time spent: 30 minutes    Loretha Stapler, MD [facility] Pager [phone]  If 7PM-7AM, please contact night-coverage [email or web address] Password [email or web address] 03/02/2016, 11:06 AM

## 2016-03-02 NOTE — Progress Notes (Signed)
[facility] Progress Note  Subjective: No complaints except hungry. No stool since admission and no solid food since admission according to the patient. Pulmonary clearance for EGD pending  Objective: Vital signs in last 24 hours: Temp:  [97.6 F (36.4 C)-97.9 F (36.6 C)] 97.6 F (36.4 C) (01/30 0800) Pulse Rate:  [61-74] 61 (01/30 0800) Resp:  [17-20] 18 (01/30 0800) BP: (129-149)/(64-74) 149/74 (01/30 0800) SpO2:  [96 %-100 %] 96 % (01/30 0800) Weight change:    PE: Unchanged  Lab Results: Results for orders placed or performed during the hospital encounter of 02/26/16 (from the past 24 hour(s))  Glucose, capillary     Status: None   Collection Time: 03/01/16 11:41 AM  Result Value Ref Range   Glucose-Capillary 97 65 - 99 mg/dL  Glucose, capillary     Status: Abnormal   Collection Time: 03/01/16  4:59 PM  Result Value Ref Range   Glucose-Capillary 116 (H) 65 - 99 mg/dL  Glucose, capillary     Status: Abnormal   Collection Time: 03/01/16  9:57 PM  Result Value Ref Range   Glucose-Capillary 176 (H) 65 - 99 mg/dL  CBC     Status: Abnormal   Collection Time: 03/02/16  5:11 AM  Result Value Ref Range   WBC 4.5 4.0 - 10.5 K/uL   RBC 3.35 (L) 4.22 - 5.81 MIL/uL   Hemoglobin 8.6 (L) 13.0 - 17.0 g/dL   HCT 27.9 (L) 39.0 - 52.0 %   MCV 83.3 78.0 - 100.0 fL   MCH 25.7 (L) 26.0 - 34.0 pg   MCHC 30.8 30.0 - 36.0 g/dL   RDW 16.4 (H) 11.5 - 15.5 %   Platelets 186 150 - 400 K/uL  Glucose, capillary     Status: None   Collection Time: 03/02/16  7:52 AM  Result Value Ref Range   Glucose-Capillary 92 65 - 99 mg/dL    Studies/Results: No results found.    Assessment: Melena and anemia Atrial fibrillation with aortic stenosis Severe COPD  Plan: Await pulmonary clearance for EGD hopefully tomorrow. We'll proceed today. Hopefully resume Coumadin if indicated after EGD.    Mary Secord C 03/02/2016, 10:39 AM  Pager [phone] If no answer or after 5 PM  call [phone]

## 2016-03-03 ENCOUNTER — Inpatient Hospital Stay (HOSPITAL_COMMUNITY): Payer: PPO | Admitting: Anesthesiology

## 2016-03-03 ENCOUNTER — Encounter (HOSPITAL_COMMUNITY): Payer: Self-pay | Admitting: Anesthesiology

## 2016-03-03 ENCOUNTER — Encounter (HOSPITAL_COMMUNITY)
Admission: EM | Disposition: A | Discharge status: Home / Self Care | Payer: Self-pay | Source: Home / Self Care | Attending: Family Medicine

## 2016-03-03 DIAGNOSIS — E662 Morbid (severe) obesity with alveolar hypoventilation: Secondary | ICD-10-CM

## 2016-03-03 DIAGNOSIS — E119 Type 2 diabetes mellitus without complications: Secondary | ICD-10-CM

## 2016-03-03 HISTORY — PX: ESOPHAGOGASTRODUODENOSCOPY: SHX5428

## 2016-03-03 HISTORY — PX: GIVENS CAPSULE STUDY: SHX5432

## 2016-03-03 LAB — TYPE AND SCREEN
ABO/RH(D): O POS
Antibody Screen: NEGATIVE

## 2016-03-03 LAB — GLUCOSE, CAPILLARY
Glucose-Capillary: 120 mg/dL — ABNORMAL HIGH (ref 65–99)
Glucose-Capillary: 96 mg/dL (ref 65–99)
Glucose-Capillary: 101 mg/dL — ABNORMAL HIGH (ref 65–99)
Glucose-Capillary: 207 mg/dL — ABNORMAL HIGH (ref 65–99)

## 2016-03-03 SURGERY — EGD (ESOPHAGOGASTRODUODENOSCOPY)
Anesthesia: Monitor Anesthesia Care

## 2016-03-03 SURGERY — IMAGING PROCEDURE, GI TRACT, INTRALUMINAL, VIA CAPSULE
Anesthesia: LOCAL

## 2016-03-03 MED ORDER — PROPOFOL 500 MG/50ML IV EMUL
INTRAVENOUS | Status: DC | PRN
  Administered 2016-03-03: 30 mg via INTRAVENOUS

## 2016-03-03 MED ORDER — SODIUM CHLORIDE 0.9 % IV SOLN: INTRAVENOUS | Status: DC

## 2016-03-03 MED ORDER — PROPOFOL 500 MG/50ML IV EMUL
INTRAVENOUS | Status: DC | PRN
  Administered 2016-03-03: 75 ug/kg/min via INTRAVENOUS

## 2016-03-03 MED ORDER — LACTATED RINGERS IV SOLN
INTRAVENOUS | Status: DC
  Administered 2016-03-03: 10:00:00 via INTRAVENOUS

## 2016-03-03 SURGICAL SUPPLY — 1 items: TOWEL COTTON PACK 4EA (MISCELLANEOUS) ×4 IMPLANT

## 2016-03-03 NOTE — Progress Notes (Addendum)
[facility] PROGRESS NOTE    Progress Note  Joel [name]  [id number] DOB: 1932/09/05 DOA: 02/26/2016 PCP: Wenda Low, MD     Brief Narrative:   Joel [name] is an 83 y.o. male with medical history significant of allergic rhinitis, anemia, anxiety, aortic stenosis, chronic atrial fibrillation, bipolar affective disorder, COPD on home oxygen, diabetic neuropathy, hyperlipidemia, gout, hypertension, prostate cancer, type 2 diabetes who was referred by his PCP to the emergency department after the patient has been feeling progressively worse with fatigue, dyspnea, dizziness and reports melanotic stools for the past week and a half. His hemoglobin level at his PCP office today was 8.0 g/dL. He denies chest pain, abdominal pain, nausea, emesis, diarrhea or constipation. He denies frequency or dysuria.  Assessment/Plan:   UGI bleed/Acute blood loss anemia Continue fresh frozen plasma and packed RBC transfusion given at time of admission. Advance diet. Pantoprazole 40 mg IVP every 12 hours.Hemoglobin has remained stable. EGD on 03/03/2016 Showed no source of bleeding, Capsule Endoscopy Today. Pulmonary was consulted, he agreed to proceed with EGD.  DM2 (diabetes mellitus, type 2) ([place]) Heart healthy diet ordered SSI  Atrial fibrillation ([place]) CHA2DS2-VASc Scoreof at least 4 Patient wishes to be off anticoagulation. HR control.  COPD mixed type ([place]) Continue supplemental oxygen. Bronchodilators as needed. Patient currently on room air Pulmonology consulted for clearance for EGD  Vertigo Meclizine restarted   DVT prophylaxis: SCD Family Communication:None Disposition Plan/Barrier to D/C: home in am Code Status:     Code Status Orders        Start     Ordered   02/27/16 0223  Full code  Continuous     02/27/16 0222    Code Status History    Date Active Date Inactive Code Status Order ID Comments User Context   01/27/2016 10:44 PM 01/28/2016  6:57  PM Full Code [id number]  Etta Quill, DO ED   12/16/2015  3:19 PM 12/18/2015  7:20 PM Full Code [id number]  Fanny Skates, MD Inpatient   12/16/2015 12:53 PM 12/16/2015  3:19 PM Full Code [id number]  Jerrye Beavers, PA-C ED   06/29/2015  3:06 AM 06/30/2015  1:34 PM DNR [id number]  Toy Baker, MD ED   10/22/2014  9:55 PM 10/23/2014  7:57 PM Full Code [id number]  Etta Quill, DO ED   08/04/2013  5:55 PM 08/06/2013 12:58 AM DNR [id number]  Thurnell Lose, MD Inpatient    Advance Directive Documentation   Flowsheet Row Most Recent Value  Type of Advance Directive  Healthcare Power of [place], Living will  Pre-existing out of facility DNR order (yellow form or pink MOST form)  No data  "MOST" Form in Place?  No data        IV Access:    Peripheral IV   Procedures and diagnostic studies:   No results found.   Medical Consultants:    None.  Anti-Infectives:   None  Subjective:    Joel [name] feels great no further GI bleed.  Objective:    Vitals:   03/02/16 1500 03/02/16 2107 03/02/16 2355 03/03/16 0621  BP: 140/74 137/76  (!) 147/66  Pulse: 70 71 63 (!) 59  Resp: 18 18 18 18   Temp: 97.9 F (36.6 C) 97.6 F (36.4 C)  97.6 F (36.4 C)  TempSrc: Oral Oral  Axillary  SpO2: 97% 100%  100%  Weight:      Height:        Intake/Output Summary (Last 24  hours) at 03/03/16 0828 Last data filed at 03/02/16 1900  Gross per 24 hour  Intake              480 ml  Output                0 ml  Net              480 ml   Filed Weights   02/27/16 1054  Weight: 110.8 kg (244 lb 4.3 oz)    Exam: General exam: In no acute distress. Respiratory system: Good air movement and clear to auscultation. Cardiovascular system: S1 & S2 heard, RRR. No JVD, murmurs, rubs, gallops or clicks.  Gastrointestinal system: Abdomen is nondistended, soft and nontender.  Central nervous system: Alert and oriented. No focal neurological deficits. Extremities: No pedal edema. Skin: No  rashes, lesions or ulcers Psychiatry: Judgement and insight appear normal. Mood & affect appropriate.    Data Reviewed:    Labs: Basic Metabolic Panel:  Recent Labs Lab 02/26/16 2022 02/27/16 1749 02/28/16 0553  NA 138 138 138  K 4.1 4.2 4.3  CL 107 106 108  CO2 25 25 25   GLUCOSE 134* 139* 116*  BUN 23* 17 14  CREATININE 1.46* 1.24 1.10  CALCIUM 8.9 8.6* 8.6*   GFR Estimated Creatinine Clearance: 67.7 mL/min (by C-G formula based on SCr of 1.1 mg/dL). Liver Function Tests:  Recent Labs Lab 02/26/16 2022 02/27/16 1749  AST 19 21  ALT 10* 11*  ALKPHOS 72 70  BILITOT 0.5 1.2  PROT 7.2 6.8  ALBUMIN 3.9 4.0   No results for input(s): LIPASE, AMYLASE in the last 168 hours. No results for input(s): AMMONIA in the last 168 hours. Coagulation profile  Recent Labs Lab 02/26/16 2022 02/27/16 1749  INR 2.93 1.46    CBC:  Recent Labs Lab 02/27/16 1749  02/29/16 1619 02/29/16 2226 03/01/16 0444 03/01/16 0941 03/02/16 0511  WBC 4.9  < > 4.6 4.1 3.5* 3.6* 4.5  NEUTROABS 3.6  --   --   --   --   --   --   HGB 7.8*  < > 8.7* 8.4* 8.9* 8.9* 8.6*  HCT 26.8*  < > 27.3* 27.2* 29.0* 28.9* 27.9*  MCV 83.2  < > 83.5 83.4 84.3 84.3 83.3  PLT 198  < > 168 169 180 180 186  < > = values in this interval not displayed. Cardiac Enzymes: No results for input(s): CKTOTAL, CKMB, CKMBINDEX, TROPONINI in the last 168 hours. BNP (last 3 results) No results for input(s): PROBNP in the last 8760 hours. CBG:  Recent Labs Lab 03/02/16 0752 03/02/16 1207 03/02/16 1706 03/02/16 2221 03/03/16 0744  GLUCAP 92 103* 178* 181* 120*   D-Dimer: No results for input(s): DDIMER in the last 72 hours. Hgb A1c: No results for input(s): HGBA1C in the last 72 hours. Lipid Profile: No results for input(s): CHOL, HDL, LDLCALC, TRIG, CHOLHDL, LDLDIRECT in the last 72 hours. Thyroid function studies: No results for input(s): TSH, T4TOTAL, T3FREE, THYROIDAB in the last 72 hours.  Invalid  input(s): FREET3 Anemia work up: No results for input(s): VITAMINB12, FOLATE, FERRITIN, TIBC, IRON, RETICCTPCT in the last 72 hours. Sepsis Labs:  Recent Labs Lab 02/29/16 2226 03/01/16 0444 03/01/16 0941 03/02/16 0511  WBC 4.1 3.5* 3.6* 4.5   Microbiology No results found for this or any previous visit (from the past 240 hour(s)).   Medications:   . sodium chloride   Intravenous Once  . furosemide  20 mg Oral Daily  . insulin aspart  0-15 Units Subcutaneous TID WC  . insulin aspart  0-5 Units Subcutaneous QHS  . pantoprazole (PROTONIX) IV  40 mg Intravenous Q12H  . sodium chloride flush  3 mL Intravenous Q12H   Continuous Infusions:  Time spent: 25 min   LOS: 6 days   Charlynne Cousins  [facility] Pager [phone]  *Please refer to [email or web address], password [email or web address] to get updated schedule on who will round on this patient, as hospitalists switch teams weekly. If 7PM-7AM, please contact night-coverage at [email or web address], password [email or web address] for any overnight needs.  03/03/2016, 8:28 AM

## 2016-03-03 NOTE — Anesthesia Preprocedure Evaluation (Signed)
Anesthesia Evaluation  Patient identified by MRN, date of birth, ID band Patient awake    Reviewed: Allergy & Precautions, NPO status , Patient's Chart, lab work & pertinent test results  Airway Mallampati: I  TM Distance: >3 FB Neck ROM: Full    Dental   Pulmonary sleep apnea , COPD, former smoker,    Pulmonary exam normal        Cardiovascular hypertension, + CAD and + CABG  Normal cardiovascular exam     Neuro/Psych    GI/Hepatic   Endo/Other  diabetes, Type 2, Oral Hypoglycemic Agents  Renal/GU      Musculoskeletal   Abdominal   Peds  Hematology   Anesthesia Other Findings   Reproductive/Obstetrics                             Anesthesia Physical Anesthesia Plan  ASA: III  Anesthesia Plan: MAC   Post-op Pain Management:    Induction: Intravenous  Airway Management Planned: Simple Face Mask  Additional Equipment:   Intra-op Plan:   Post-operative Plan:   Informed Consent: I have reviewed the patients History and Physical, chart, labs and discussed the procedure including the risks, benefits and alternatives for the proposed anesthesia with the patient or authorized representative who has indicated his/her understanding and acceptance.     Plan Discussed with: CRNA and Surgeon  Anesthesia Plan Comments:         Anesthesia Quick Evaluation

## 2016-03-03 NOTE — Progress Notes (Signed)
EGD done, no abnormalities. Patient states is his third occurrence of anemia with heme positive stool. His E had a colonoscopy 2 years ago was unrevealing. We'll proceed with capsule endoscopy at this time. I would go ahead and resume Coumadin for now

## 2016-03-03 NOTE — Anesthesia Postprocedure Evaluation (Signed)
Anesthesia Post Note  Patient: CLEAVEN DEMARIO  Procedure(s) Performed: Procedure(s) (LRB): ESOPHAGOGASTRODUODENOSCOPY (EGD) (N/A)  Patient location during evaluation: PACU Anesthesia Type: MAC Level of consciousness: awake and alert Pain management: pain level controlled Vital Signs Assessment: post-procedure vital signs reviewed and stable Respiratory status: spontaneous breathing, nonlabored ventilation, respiratory function stable and patient connected to nasal cannula oxygen Cardiovascular status: blood pressure returned to baseline and stable Postop Assessment: no signs of nausea or vomiting Anesthetic complications: no       Last Vitals:  Vitals:   03/03/16 1110 03/03/16 1346  BP: (!) 150/66 (!) 142/71  Pulse: 64 63  Resp: 20 19  Temp:  36.7 C    Last Pain:  Vitals:   03/03/16 1346  TempSrc: Oral  PainSc:                  Shawniece Oyola DAVID

## 2016-03-03 NOTE — Transfer of Care (Signed)
Immediate Anesthesia Transfer of Care Note  Patient: Joel [name]  Procedure(s) Performed: Procedure(s): ESOPHAGOGASTRODUODENOSCOPY (EGD) (N/A)  Patient Location: PACU  Anesthesia Type:MAC  Level of Consciousness:  sedated, patient cooperative and responds to stimulation  Airway & Oxygen Therapy:Patient Spontanous Breathing and Patient connected to face mask oxgen  Post-op Assessment:  Report given to PACU RN and Post -op Vital signs reviewed and stable  Post vital signs:  Reviewed and stable  Last Vitals:  Vitals:   03/03/16 0953 03/03/16 1023  BP: (!) 165/100 (!) 149/75  Pulse: 66   Resp: 18   Temp: 53.6 C     Complications: No apparent anesthesia complications

## 2016-03-03 NOTE — Op Note (Signed)
[facility] Patient Name: Joel [name] Procedure Date: 03/03/2016 MRN: [id number] Attending MD: Missy Sabins , MD Date of Birth: 03-13-1932 CSN: [id number] Age: 83 Admit Type: Inpatient Procedure:                Upper GI endoscopy Indications:              Suspected upper gastrointestinal bleeding in                            patient with unexplained iron deficiency anemia Providers:                Elyse Jarvis. Amedeo Plenty, MD, Burtis Junes, RN, Cherylynn Ridges,                            Technician Referring MD:              Medicines:                Propofol per Anesthesia Complications:            No immediate complications. Estimated Blood Loss:     Estimated blood loss: none. Procedure:                Pre-Anesthesia Assessment:                           - Prior to the procedure, a History and Physical                            was performed, and patient medications and                            allergies were reviewed. The patient's tolerance of                            previous anesthesia was also reviewed. The risks                            and benefits of the procedure and the sedation                            options and risks were discussed with the patient.                            All questions were answered, and informed consent                            was obtained. Prior Anticoagulants: The patient has                            taken no previous anticoagulant or antiplatelet                            agents. ASA Grade Assessment: III - A patient with  severe systemic disease. After reviewing the risks                            and benefits, the patient was deemed in                            satisfactory condition to undergo the procedure.                           After obtaining informed consent, the endoscope was                            passed under direct vision. Throughout the                            procedure, the  patient's blood pressure, pulse, and                            oxygen saturations were monitored continuously. The                            was introduced through the mouth, and advanced to                            the second part of duodenum. The upper GI endoscopy                            was accomplished without difficulty. The patient                            tolerated the procedure well. Scope In: Scope Out: Findings:      The examined esophagus was normal.      The entire examined stomach was normal.      The examined duodenum was normal. Impression:               - Normal esophagus.                           - Normal stomach.                           - Normal examined duodenum.                           - No specimens collected. Moderate Sedation:      no moderate sedation Recommendation:           - Resume previous diet.                           - Resume Coumadin (warfarin) at prior dose today.                           - Observe patient's clinical course.                           -  monitor stools and hemoglobin, okay to discharge                            from GI standpoint. Procedure Code(s):        --- Professional ---                           [phone], Esophagogastroduodenoscopy, flexible,                            transoral; diagnostic, including collection of                            specimen(s) by brushing or washing, when performed                            (separate procedure) Diagnosis Code(s):        --- Professional ---                           D50.9, Iron deficiency anemia, unspecified CPT copyright 2016 [employer]. All rights reserved. The codes documented in this report are preliminary and upon coder review may  be revised to meet current compliance requirements. Missy Sabins, MD 03/03/2016 11:04:21 AM This report has been signed electronically. Number of Addenda: 0

## 2016-03-03 NOTE — Progress Notes (Signed)
PCCM Consult Note  Admission date: 02/26/2016 Consult date: 03/02/2016 Referring provider: Dr. Adair Patter, [facility]  CC: Preoperative respiratory assessment  HPI: 83 yo male former smoker sent to ER by PCP for assessment of anemia associated with progressive fatigue.  He was having melanotic stools.  He has hx of A fib on coumadin.  He was given transfusions of PRBC and FFP.  GI was consulted, and he requires endoscopy for further assessment of bleeding source.  He has hx of COPD and sleep apnea.  Spirometry from 10/14/15 showed mild obstruction.  He has sleep study from 01/21/09 which showed very severe sleep apnea.  He is followed by Dr. Annamaria Boots in pulmonary office.  He uses Bipap 18/14 cm H2O with 2 liters oxygen at night.  He has occasional cough, and mostly with allergies.  He has symbicort and albuterol at home, but hardly feels the need to use these.  He uses flonase for allergies.  He does not usually have chest congestion or wheezing.  He typically only uses his oxygen at night with Bipap, but was using during the day for the past week in the setting of anemia.  He doesn't feel like he needs oxygen during the day now that his blood counts are better.   Vital signs: BP (!) 150/66   Pulse 64   Temp 98.3 F (36.8 C) (Oral)   Resp 20   Ht 6' (1.829 m)   Wt 244 lb 4.3 oz (110.8 kg)   SpO2 97%   BMI 33.13 kg/m   Intake/output: I/O last 3 completed shifts: In: 480 [P.O.:480] Out: -   General: pleasant, sitting upon side of bed in PACU Neuro: Alert, normal strength, CN intact Eyes: pupils reactive ENT: no sinus tenderness, no oral exudate, no stridor, no LAN Cardiac: regular, 2/6 SM Chest: no wheeze/rales Abd: soft, non tender Ext: ankle edema Skin: no rashes   CMP Latest Ref Rng & Units 02/28/2016 02/27/2016 02/26/2016  Glucose 65 - 99 mg/dL 116(H) 139(H) 134(H)  BUN 6 - 20 mg/dL 14 17 23(H)  Creatinine 0.61 - 1.24 mg/dL 1.10 1.24 1.46(H)  Sodium 135 - 145 mmol/L 138 138 138   Potassium 3.5 - 5.1 mmol/L 4.3 4.2 4.1  Chloride 101 - 111 mmol/L 108 106 107  CO2 22 - 32 mmol/L 25 25 25   Calcium 8.9 - 10.3 mg/dL 8.6(L) 8.6(L) 8.9  Total Protein 6.5 - 8.1 g/dL - 6.8 7.2  Total Bilirubin 0.3 - 1.2 mg/dL - 1.2 0.5  Alkaline Phos 38 - 126 U/L - 70 72  AST 15 - 41 U/L - 21 19  ALT 17 - 63 U/L - 11(L) 10(L)     CBC Latest Ref Rng & Units 03/02/2016 03/01/2016 03/01/2016  WBC 4.0 - 10.5 K/uL 4.5 3.6(L) 3.5(L)  Hemoglobin 13.0 - 17.0 g/dL 8.6(L) 8.9(L) 8.9(L)  Hematocrit 39.0 - 52.0 % 27.9(L) 28.9(L) 29.0(L)  Platelets 150 - 400 K/uL 186 180 180     ABG    Component Value Date/Time   TCO2 23 12/16/2015 1123     CBG (last 3)   Recent Labs  03/02/16 1706 03/02/16 2221 03/03/16 0744  GLUCAP 178* 181* 120*     Imaging: No results found.   Studies: PSG 01/21/09 >> AHI 102.8, SpO2 low 83% PFT 11/29/12 >> FEV1 2.43 (75%), FEV1% 70, TLC 6.11 (81%), DLCO 61%, +BD Echo 06/29/15 >> mod LVH, EF 55 to 60%, mod AS Spirometry 10/14/15 >> FEV1 2.38 (75%), FEV1% 67  Summary: 83 yo  male with symptomatic anemia likely from GI source of bleeding.  He needs endoscopy.  He has mild COPD with asthma in the setting of seasonal allergies and remote prior history of smoking.  He has severe obstructive sleep apnea on Bipap and supplemental oxygen at night.  There are no pulmonary contraindications for him having EGD.  Assessment/plan:  GI Bleeding with symptomatic anemia. - he can proceed with EGD - he will need close monitoring of airway and oxygenation during the peri-procedure period - he tolerated EDG 1/31 without any issues. Back to room awake and alert Mild COPD with asthma. - prn albuterol  Obstructive sleep apnea with sleep related hypoxia. - continue Bipap 18/14 cm H2O with 2 liters oxygen at night  Allergic rhinitis. - flonase as needed  PCCM available PRN.  Richardson Landry Minor ACNP Maryanna Shape PCCM Pager [phone] till 3 pm If no answer page [phone] 03/03/2016,  11:28 AM

## 2016-03-04 ENCOUNTER — Encounter (HOSPITAL_COMMUNITY): Payer: Self-pay | Admitting: Gastroenterology

## 2016-03-04 DIAGNOSIS — I482 Chronic atrial fibrillation: Secondary | ICD-10-CM

## 2016-03-04 LAB — GLUCOSE, CAPILLARY: Glucose-Capillary: 133 mg/dL — ABNORMAL HIGH (ref 65–99)

## 2016-03-04 MED ORDER — ASPIRIN EC 325 MG PO TBEC: 325.0000 mg | DELAYED_RELEASE_TABLET | Freq: Every day | ORAL | 0 refills | Status: DC

## 2016-03-04 MED ORDER — PANTOPRAZOLE SODIUM 40 MG PO TBEC: 40.0000 mg | DELAYED_RELEASE_TABLET | Freq: Every day | ORAL | 6 refills | Status: DC

## 2016-03-04 NOTE — Discharge Summary (Signed)
Physician Discharge [place] [id number] DOB: [birth year] DOA: 02/26/2016  PCP: Wenda Low, MD  Admit date: 02/26/2016 Discharge date: 03/04/2016  Admitted From: home Disposition:  Home  Recommendations for Outpatient Follow-up:  1. Follow up with PCP in 1-2 weeks, Will need to talk to him about the new normal index and anticoagulation. At this point on the day of discharge he wishes to be on aspirin, as he relates that oral anticoagulants makes him bleed and is making him nervous going on them. 2. Please obtain BMP/CBC in one week 3. Follow-up with GI in 2 weeks.  Home Health:No Equipment/Devices:None  Discharge Condition:stable CODE STATUS:full Diet recommendation: Heart Healthy  Brief/Interim Summary: Per MD: 83 year old with past medical history of allergic rhinitis chronic atrial fibrillation, bipolar affective disorder, COPD on home oxygen, diabetic neuropathy, hyperlipidemia, gout, hypertension, prostate cancer, type 2 diabetes who was referred by his PCP to the emergency department after the patient has been feeling progressively worse with fatigue, dyspnea, dizziness and reports melanotic stools for the past week and a half. His hemoglobin level at his PCP office today was 8.0 g/dL  Discharge Diagnoses:  Principal Problem:   UGI bleed Active Problems:   DM2 (diabetes mellitus, type 2) (HCC)   Atrial fibrillation (HCC)   COPD mixed type (HCC)   Acute blood loss anemia  UGI bleed/Acute blood loss anemia S/p fresh frozen plasma and packed RBC transfusion given at time of admission. Started on Pantoprazole 40 mg IVP every 12 hours. EGD on 03/03/2016 Showed no source of bleeding, Capsule Endoscopy done to follow up result with GI. Hemoglobin 8.6 on discharge.  DM2 (diabetes mellitus, type 2) (HCC) Heart healthy diet ordered SSI  Chronic Atrial fibrillation (HCC) CHA2DS2-VASc Scoreof at least 4, did tell him that he has greater than 6% chance of  having a stroke I had a long discussion with the patient about the risk and benefits of being on anticoagulation he relates he does not wish to be on any type of anticoagulation at that talk to him about the new oral anticoagulants have a lower risk of GI bleed he relates he wishes to think about it, he did relate that he be willing to try aspirin. We agreed to try this. We would have a further discussion with his PCP and GI as an outpatient. Patient wishes to be off anticoagulation. HR control.  COPD mixed type ([place]) Continue supplemental oxygen. Patient currently on room air  Vertigo Meclizine restarted   Discharge Instructions  Discharge Instructions    Diet - low sodium heart healthy    Complete by:  As directed    Increase activity slowly    Complete by:  As directed      Allergies as of 03/04/2016      Reactions   Penicillins Anaphylaxis   Has patient had a PCN reaction causing immediate rash, facial/tongue/throat swelling, SOB or lightheadedness with hypotension: unknown Has patient had a PCN reaction causing severe rash involving mucus membranes or skin necrosis: unknown Has patient had a PCN reaction that required hospitalization: unknown Has patient had a PCN reaction occurring within the last 10 years: no If all of the above answers are "NO", then may proceed with Cephalosporin use.   Antihistamines, Loratadine-type    depression   Other    Beta blockers-Novacaine depression      Medication List    STOP taking these medications   warfarin 5 MG tablet Commonly known as:  COUMADIN     TAKE  these medications   allopurinol 100 MG tablet Commonly known as:  ZYLOPRIM Take 100 mg by mouth daily.   aspirin EC 325 MG tablet Take 1 tablet (325 mg total) by mouth daily.   buPROPion 150 MG 24 hr tablet Commonly known as:  WELLBUTRIN XL Take 1 tablet (150 mg total) by mouth daily.   CARTIA XT 120 MG 24 hr capsule Generic drug:  diltiazem Take 120 mg by mouth  daily.   citalopram 40 MG tablet Commonly known as:  CELEXA Take 20 mg by mouth daily.   dextromethorphan-guaiFENesin 30-600 MG 12hr tablet Commonly known as:  MUCINEX DM Take 1 tablet by mouth 2 (two) times daily.   diltiazem 120 MG 24 hr capsule Commonly known as:  DILACOR XR Take 120 mg by mouth at bedtime.   fluticasone 50 MCG/ACT nasal spray Commonly known as:  FLONASE Place 2 sprays into both nostrils daily.   furosemide 80 MG tablet Commonly known as:  LASIX Take 80 mg by mouth daily.   iron polysaccharides 150 MG capsule Commonly known as:  NIFEREX Take 1 capsule (150 mg total) by mouth 2 (two) times daily.   JANUVIA 100 MG tablet Generic drug:  sitaGLIPtin Take 1 tablet by mouth daily.   lovastatin 20 MG tablet Commonly known as:  MEVACOR Take 20 mg by mouth daily at 6 PM.   meclizine 25 MG tablet Commonly known as:  ANTIVERT Take 1 tablet (25 mg total) by mouth 3 (three) times daily as needed for dizziness.   metFORMIN 1000 MG tablet Commonly known as:  GLUCOPHAGE Take 1,000 mg by mouth 2 (two) times daily.   OVER THE COUNTER MEDICATION Take 1 tablet by mouth 2 (two) times daily. OTC Vision supplement   pantoprazole 40 MG tablet Commonly known as:  PROTONIX Take 1 tablet (40 mg total) by mouth daily.   potassium chloride SA 20 MEQ tablet Commonly known as:  K-DUR,KLOR-CON Take 1 tablet by mouth daily.   temazepam 15 MG capsule Commonly known as:  RESTORIL Take 15 mg by mouth at bedtime.   valsartan 160 MG tablet Commonly known as:  DIOVAN Take 160 mg by mouth every morning.       Allergies  Allergen Reactions  . Penicillins Anaphylaxis    Has patient had a PCN reaction causing immediate rash, facial/tongue/throat swelling, SOB or lightheadedness with hypotension: unknown Has patient had a PCN reaction causing severe rash involving mucus membranes or skin necrosis: unknown Has patient had a PCN reaction that required hospitalization:  unknown Has patient had a PCN reaction occurring within the last 10 years: no If all of the above answers are "NO", then may proceed with Cephalosporin use.   Marland Kitchen Antihistamines, Loratadine-Type     depression  . Other     Beta blockers-Novacaine depression    Consultations:  GI Amedeo Plenty  PCCM D.r Halford Chessman.   Procedures/Studies: Nm Bone Scan Whole Body  Result Date: 02/13/2016 CLINICAL DATA:  Prostate carcinoma history.  PSA 9.17. EXAM: NUCLEAR MEDICINE WHOLE BODY BONE SCAN TECHNIQUE: Whole body anterior and posterior images were obtained approximately 3 hours after intravenous injection of radiopharmaceutical. RADIOPHARMACEUTICALS:  20.9 mCi Technetium-60m MDP IV COMPARISON:  Pelvis CT, 02/10/2016. FINDINGS: There are no foci of abnormal radiotracer localization to suggest metastatic disease to bone. There is mild uptake along anterior rib ends on the right from the second through sixth ribs, most likely related to inflammation or injury. Degenerative type uptake is noted involving the shoulders, sternoclavicular joints, along  the posterior thoracic spine and involving the knees. Renal uptake is relatively symmetric. IMPRESSION: 1. No evidence of metastatic disease to bone. Electronically Signed   By: Lajean Manes M.D.   On: 02/13/2016 15:46      Subjective: Feels great now complains.  Discharge Exam: Vitals:   03/03/16 2320 03/04/16 0656  BP:  (!) 145/82  Pulse:  65  Resp: 18 18  Temp:  98 F (36.7 C)   Vitals:   03/03/16 1346 03/03/16 2227 03/03/16 2320 03/04/16 0656  BP: (!) 142/71 (!) 141/70  (!) 145/82  Pulse: 63 76  65  Resp: 19 20 18 18   Temp: 98 F (36.7 C) 98 F (36.7 C)  98 F (36.7 C)  TempSrc: Oral Oral  Oral  SpO2: 100% 94%  96%  Weight:      Height:        General: Pt is alert, awake, not in acute distress Cardiovascular: RRR, S1/S2 +, no rubs, no gallops Respiratory: CTA bilaterally, no wheezing, no rhonchi Abdominal: Soft, NT, ND, bowel sounds  + Extremities: no edema, no cyanosis    The results of significant diagnostics from this hospitalization (including imaging, microbiology, ancillary and laboratory) are listed below for reference.     Microbiology: No results found for this or any previous visit (from the past 240 hour(s)).   Labs: BNP (last 3 results)  Recent Labs  06/28/15 2227  BNP 0000000   Basic Metabolic Panel:  Recent Labs Lab 02/26/16 2022 02/27/16 1749 02/28/16 0553  NA 138 138 138  K 4.1 4.2 4.3  CL 107 106 108  CO2 25 25 25   GLUCOSE 134* 139* 116*  BUN 23* 17 14  CREATININE 1.46* 1.24 1.10  CALCIUM 8.9 8.6* 8.6*   Liver Function Tests:  Recent Labs Lab 02/26/16 2022 02/27/16 1749  AST 19 21  ALT 10* 11*  ALKPHOS 72 70  BILITOT 0.5 1.2  PROT 7.2 6.8  ALBUMIN 3.9 4.0   No results for input(s): LIPASE, AMYLASE in the last 168 hours. No results for input(s): AMMONIA in the last 168 hours. CBC:  Recent Labs Lab 02/27/16 1749  02/29/16 1619 02/29/16 2226 03/01/16 0444 03/01/16 0941 03/02/16 0511  WBC 4.9  < > 4.6 4.1 3.5* 3.6* 4.5  NEUTROABS 3.6  --   --   --   --   --   --   HGB 7.8*  < > 8.7* 8.4* 8.9* 8.9* 8.6*  HCT 26.8*  < > 27.3* 27.2* 29.0* 28.9* 27.9*  MCV 83.2  < > 83.5 83.4 84.3 84.3 83.3  PLT 198  < > 168 169 180 180 186  < > = values in this interval not displayed. Cardiac Enzymes: No results for input(s): CKTOTAL, CKMB, CKMBINDEX, TROPONINI in the last 168 hours. BNP: Invalid input(s): POCBNP CBG:  Recent Labs Lab 03/03/16 0744 03/03/16 1201 03/03/16 1712 03/03/16 2327 03/04/16 0747  GLUCAP 120* 96 101* 207* 133*   D-Dimer No results for input(s): DDIMER in the last 72 hours. Hgb A1c No results for input(s): HGBA1C in the last 72 hours. Lipid Profile No results for input(s): CHOL, HDL, LDLCALC, TRIG, CHOLHDL, LDLDIRECT in the last 72 hours. Thyroid function studies No results for input(s): TSH, T4TOTAL, T3FREE, THYROIDAB in the last 72  hours.  Invalid input(s): FREET3 Anemia work up No results for input(s): VITAMINB12, FOLATE, FERRITIN, TIBC, IRON, RETICCTPCT in the last 72 hours. Urinalysis    Component Value Date/Time   COLORURINE YELLOW 01/27/2016 1730  APPEARANCEUR CLOUDY (A) 01/27/2016 1730   LABSPEC 1.019 01/27/2016 1730   PHURINE 5.0 01/27/2016 1730   GLUCOSEU NEGATIVE 01/27/2016 1730   HGBUR NEGATIVE 01/27/2016 1730   BILIRUBINUR NEGATIVE 01/27/2016 1730   KETONESUR 5 (A) 01/27/2016 1730   PROTEINUR NEGATIVE 01/27/2016 1730   UROBILINOGEN 0.2 10/22/2014 2136   NITRITE NEGATIVE 01/27/2016 1730   LEUKOCYTESUR NEGATIVE 01/27/2016 1730   Sepsis Labs Invalid input(s): PROCALCITONIN,  WBC,  LACTICIDVEN Microbiology No results found for this or any previous visit (from the past 240 hour(s)).   Time coordinating discharge: Over 30 minutes  SIGNED:   Charlynne Cousins, MD  [facility] 03/04/2016, 11:21 AM Pager   If 7PM-7AM, please contact night-coverage [email or web address] Password [email or web address]

## 2016-03-10 DIAGNOSIS — D509 Iron deficiency anemia, unspecified: Secondary | ICD-10-CM | POA: Diagnosis not present

## 2016-03-11 ENCOUNTER — Telehealth: Payer: Self-pay | Admitting: *Deleted

## 2016-03-11 DIAGNOSIS — C61 Malignant neoplasm of prostate: Secondary | ICD-10-CM | POA: Diagnosis not present

## 2016-03-11 NOTE — Telephone Encounter (Signed)
CALLED PT. TO GIVE APPT. FOR SIM ON 03-19-16 @ 1 PM, NO ANSWER WILL CALL LATER

## 2016-03-15 DIAGNOSIS — K625 Hemorrhage of anus and rectum: Secondary | ICD-10-CM | POA: Diagnosis not present

## 2016-03-18 DIAGNOSIS — E662 Morbid (severe) obesity with alveolar hypoventilation: Secondary | ICD-10-CM | POA: Diagnosis not present

## 2016-03-18 DIAGNOSIS — J410 Simple chronic bronchitis: Secondary | ICD-10-CM | POA: Diagnosis not present

## 2016-03-18 DIAGNOSIS — M1711 Unilateral primary osteoarthritis, right knee: Secondary | ICD-10-CM | POA: Diagnosis not present

## 2016-03-18 DIAGNOSIS — M170 Bilateral primary osteoarthritis of knee: Secondary | ICD-10-CM | POA: Diagnosis not present

## 2016-03-18 DIAGNOSIS — M1712 Unilateral primary osteoarthritis, left knee: Secondary | ICD-10-CM | POA: Diagnosis not present

## 2016-03-18 DIAGNOSIS — G4733 Obstructive sleep apnea (adult) (pediatric): Secondary | ICD-10-CM | POA: Diagnosis not present

## 2016-03-19 ENCOUNTER — Ambulatory Visit
Admission: RE | Admit: 2016-03-19 | Discharge: 2016-03-19 | Disposition: A | Discharge status: Home / Self Care | Payer: PPO | Source: Ambulatory Visit | Attending: Radiation Oncology | Admitting: Radiation Oncology

## 2016-03-19 ENCOUNTER — Other Ambulatory Visit: Payer: Self-pay | Admitting: Gastroenterology

## 2016-03-19 DIAGNOSIS — C61 Malignant neoplasm of prostate: Secondary | ICD-10-CM

## 2016-03-19 NOTE — Progress Notes (Signed)
  Radiation Oncology         [phone] ________________________________  Name: Joel [name] MRN: [id number]  Date: 03/19/2016  DOB: 10/16/1932  SIMULATION AND TREATMENT PLANNING NOTE    ICD-9-CM ICD-10-CM   1. Malignant neoplasm of prostate ([place]) 185 C61     DIAGNOSIS:  83 y.o. gentleman with stage T2a adenocarcinoma of the prostate with a Gleason's score of 4+4 and a PSA of 9.90  We cancelled simulation today to await upcoming upper GI intended to assess GI bleed prior to starting XRT  ________________________________  Sheral Apley Tammi Klippel, M.D.

## 2016-03-25 ENCOUNTER — Encounter (HOSPITAL_COMMUNITY): Payer: Self-pay | Admitting: *Deleted

## 2016-03-26 ENCOUNTER — Encounter (HOSPITAL_COMMUNITY): Payer: Self-pay | Admitting: Anesthesiology

## 2016-03-26 ENCOUNTER — Ambulatory Visit (HOSPITAL_COMMUNITY)
Admission: RE | Admit: 2016-03-26 | Discharge: 2016-03-26 | Disposition: A | Discharge status: Home / Self Care | Payer: PPO | Source: Ambulatory Visit | Attending: Gastroenterology | Admitting: Gastroenterology

## 2016-03-26 ENCOUNTER — Encounter (HOSPITAL_COMMUNITY)
Admission: RE | Disposition: A | Discharge status: Home / Self Care | Payer: Self-pay | Source: Ambulatory Visit | Attending: Gastroenterology

## 2016-03-26 ENCOUNTER — Ambulatory Visit (HOSPITAL_COMMUNITY): Payer: PPO | Admitting: Anesthesiology

## 2016-03-26 DIAGNOSIS — E1142 Type 2 diabetes mellitus with diabetic polyneuropathy: Secondary | ICD-10-CM | POA: Diagnosis not present

## 2016-03-26 DIAGNOSIS — Z87891 Personal history of nicotine dependence: Secondary | ICD-10-CM | POA: Insufficient documentation

## 2016-03-26 DIAGNOSIS — F329 Major depressive disorder, single episode, unspecified: Secondary | ICD-10-CM | POA: Diagnosis not present

## 2016-03-26 DIAGNOSIS — J449 Chronic obstructive pulmonary disease, unspecified: Secondary | ICD-10-CM | POA: Diagnosis not present

## 2016-03-26 DIAGNOSIS — I4891 Unspecified atrial fibrillation: Secondary | ICD-10-CM | POA: Diagnosis not present

## 2016-03-26 DIAGNOSIS — Z79899 Other long term (current) drug therapy: Secondary | ICD-10-CM | POA: Insufficient documentation

## 2016-03-26 DIAGNOSIS — Z7982 Long term (current) use of aspirin: Secondary | ICD-10-CM | POA: Diagnosis not present

## 2016-03-26 DIAGNOSIS — N182 Chronic kidney disease, stage 2 (mild): Secondary | ICD-10-CM | POA: Diagnosis not present

## 2016-03-26 DIAGNOSIS — Z9981 Dependence on supplemental oxygen: Secondary | ICD-10-CM | POA: Insufficient documentation

## 2016-03-26 DIAGNOSIS — I130 Hypertensive heart and chronic kidney disease with heart failure and stage 1 through stage 4 chronic kidney disease, or unspecified chronic kidney disease: Secondary | ICD-10-CM | POA: Insufficient documentation

## 2016-03-26 DIAGNOSIS — Z7951 Long term (current) use of inhaled steroids: Secondary | ICD-10-CM | POA: Insufficient documentation

## 2016-03-26 DIAGNOSIS — C61 Malignant neoplasm of prostate: Secondary | ICD-10-CM | POA: Diagnosis not present

## 2016-03-26 DIAGNOSIS — E669 Obesity, unspecified: Secondary | ICD-10-CM | POA: Insufficient documentation

## 2016-03-26 DIAGNOSIS — E1122 Type 2 diabetes mellitus with diabetic chronic kidney disease: Secondary | ICD-10-CM | POA: Diagnosis not present

## 2016-03-26 DIAGNOSIS — M109 Gout, unspecified: Secondary | ICD-10-CM | POA: Insufficient documentation

## 2016-03-26 DIAGNOSIS — F419 Anxiety disorder, unspecified: Secondary | ICD-10-CM | POA: Diagnosis not present

## 2016-03-26 DIAGNOSIS — K625 Hemorrhage of anus and rectum: Secondary | ICD-10-CM | POA: Diagnosis not present

## 2016-03-26 DIAGNOSIS — G4733 Obstructive sleep apnea (adult) (pediatric): Secondary | ICD-10-CM | POA: Insufficient documentation

## 2016-03-26 DIAGNOSIS — J309 Allergic rhinitis, unspecified: Secondary | ICD-10-CM | POA: Insufficient documentation

## 2016-03-26 DIAGNOSIS — D649 Anemia, unspecified: Secondary | ICD-10-CM | POA: Insufficient documentation

## 2016-03-26 DIAGNOSIS — Z6830 Body mass index (BMI) 30.0-30.9, adult: Secondary | ICD-10-CM | POA: Insufficient documentation

## 2016-03-26 DIAGNOSIS — D509 Iron deficiency anemia, unspecified: Secondary | ICD-10-CM | POA: Diagnosis not present

## 2016-03-26 DIAGNOSIS — E785 Hyperlipidemia, unspecified: Secondary | ICD-10-CM | POA: Diagnosis not present

## 2016-03-26 DIAGNOSIS — Z7984 Long term (current) use of oral hypoglycemic drugs: Secondary | ICD-10-CM | POA: Diagnosis not present

## 2016-03-26 DIAGNOSIS — I509 Heart failure, unspecified: Secondary | ICD-10-CM | POA: Insufficient documentation

## 2016-03-26 DIAGNOSIS — K922 Gastrointestinal hemorrhage, unspecified: Secondary | ICD-10-CM | POA: Diagnosis not present

## 2016-03-26 HISTORY — PX: ESOPHAGOGASTRODUODENOSCOPY: SHX5428

## 2016-03-26 LAB — GLUCOSE, CAPILLARY: Glucose-Capillary: 110 mg/dL — ABNORMAL HIGH (ref 65–99)

## 2016-03-26 SURGERY — EGD (ESOPHAGOGASTRODUODENOSCOPY)
Anesthesia: Monitor Anesthesia Care

## 2016-03-26 MED ORDER — LIDOCAINE HCL (CARDIAC) 20 MG/ML IV SOLN
INTRAVENOUS | Status: DC | PRN
  Administered 2016-03-26: 50 mg via INTRATRACHEAL

## 2016-03-26 MED ORDER — LACTATED RINGERS IV SOLN
INTRAVENOUS | Status: DC
Start: 2016-03-26 — End: 2016-03-26
  Administered 2016-03-26: 1000 mL via INTRAVENOUS

## 2016-03-26 MED ORDER — SODIUM CHLORIDE 0.9 % IV SOLN: INTRAVENOUS | Status: DC | PRN

## 2016-03-26 MED ORDER — PROPOFOL 10 MG/ML IV BOLUS
INTRAVENOUS | Status: DC | PRN
  Administered 2016-03-26: 50 mg via INTRAVENOUS
  Administered 2016-03-26: 30 mg via INTRAVENOUS
  Administered 2016-03-26: 50 mg via INTRAVENOUS

## 2016-03-26 MED ORDER — SODIUM CHLORIDE 0.9 % IV SOLN: INTRAVENOUS | Status: DC

## 2016-03-26 MED ORDER — ONDANSETRON HCL 4 MG/2ML IJ SOLN
INTRAMUSCULAR | Status: DC | PRN
  Administered 2016-03-26: 4 mg via INTRAVENOUS

## 2016-03-26 NOTE — Interval H&P Note (Signed)
History and Physical Interval Note:  03/26/2016 9:18 AM  Giles  has presented today for surgery, with the diagnosis of GI bleed  The various methods of treatment have been discussed with the patient and family. After consideration of risks, benefits and other options for treatment, the patient has consented to  Procedure(s) with comments: ESOPHAGOGASTRODUODENOSCOPY (EGD) (N/A) - would like to use ultraslim scope as a surgical intervention .  The patient's history has been reviewed, patient examined, no change in status, stable for surgery.  I have reviewed the patient's chart and labs.  Questions were answered to the patient's satisfaction.     Romayne Ticas C

## 2016-03-26 NOTE — Discharge Instructions (Signed)

## 2016-03-26 NOTE — Anesthesia Procedure Notes (Signed)
Procedure Name: MAC Date/Time: 03/26/2016 9:16 AM Performed by: Jacquiline Doe A Pre-anesthesia Checklist: Patient identified, Emergency Drugs available, Suction available and Patient being monitored Patient Re-evaluated:Patient Re-evaluated prior to inductionOxygen Delivery Method: Nasal cannula Intubation Type: IV induction Placement Confirmation: positive ETCO2 Dental Injury: Teeth and Oropharynx as per pre-operative assessment

## 2016-03-26 NOTE — Anesthesia Postprocedure Evaluation (Signed)
Anesthesia Post Note  Patient: Joel [name]  Procedure(s) Performed: Procedure(s) (LRB): ESOPHAGOGASTRODUODENOSCOPY (EGD) (N/A)  Patient location during evaluation: PACU Anesthesia Type: MAC Level of consciousness: awake Pain management: pain level controlled Vital Signs Assessment: post-procedure vital signs reviewed and stable Respiratory status: spontaneous breathing Cardiovascular status: stable Anesthetic complications: no       Last Vitals:  Vitals:   03/26/16 1020 03/26/16 1030  BP: (!) 120/53 121/65  Pulse: 62 73  Resp: 17 19  Temp:      Last Pain:  Vitals:   03/26/16 1000  TempSrc: Oral                 Magaly Pollina

## 2016-03-26 NOTE — Op Note (Signed)
[facility] Patient Name: Joel [name] Procedure Date : 03/26/2016 MRN: [id number] Attending MD: Missy Sabins , MD Date of Birth: [birth year] CSN: [id number] Age: 83 Admit Type: Outpatient Procedure:                Upper GI endoscopy Indications:              Recent gastrointestinal bleeding Providers:                Elyse Jarvis. Amedeo Plenty, MD, Cleda Daub, RN, Elspeth Cho Tech., Technician, Jacquiline Doe, CRNA Referring MD:              Medicines:                Propofol per Anesthesia Complications:            No immediate complications. Estimated Blood Loss:     Estimated blood loss: none. Procedure:                Pre-Anesthesia Assessment:                           - Prior to the procedure, a History and Physical                            was performed, and patient medications and                            allergies were reviewed. The patient's tolerance of                            previous anesthesia was also reviewed. The risks                            and benefits of the procedure and the sedation                            options and risks were discussed with the patient.                            All questions were answered, and informed consent                            was obtained. Prior Anticoagulants: The patient has                            taken no previous anticoagulant or antiplatelet                            agents. ASA Grade Assessment: III - A patient with                            severe systemic disease. After reviewing the risks  and benefits, the patient was deemed in                            satisfactory condition to undergo the procedure.                           After obtaining informed consent, the endoscope was                            passed under direct vision. Throughout the                            procedure, the patient's blood pressure, pulse, and         oxygen saturations were monitored continuously. The                            [id number] scope was introduced through                            the mouth, and advanced to the second part of                            duodenum. The upper GI endoscopy was accomplished                            without difficulty. The patient tolerated the                            procedure well. Scope In: Scope Out: Findings:      The examined esophagus was normal.      The entire examined stomach was normal.      The examined duodenum was normal.      The examined jejunum was normal.      There is no endoscopic evidence of bleeding in the jejunum. Impression:               - Normal esophagus.                           - Normal stomach.                           - Normal examined duodenum.                           - Normal examined jejunum.                           - No specimens collected. Moderate Sedation:      no moderate sedation Recommendation:           - Patient has a contact number available for                            emergencies. The signs and symptoms of potential  delayed complications were discussed with the                            patient. Return to normal activities tomorrow.                            Written discharge instructions were provided to the                            patient.                           - Resume previous diet.                           - Continue present medications.                           - Discharge patient to home.                           - Return to my office PRN. Procedure Code(s):        --- Professional ---                           [phone], Esophagogastroduodenoscopy, flexible,                            transoral; diagnostic, including collection of                            specimen(s) by brushing or washing, when performed                            (separate procedure) Diagnosis Code(s):         --- Professional ---                           K92.2, Gastrointestinal hemorrhage, unspecified CPT copyright 2016 American Medical Association. All rights reserved. The codes documented in this report are preliminary and upon coder review may  be revised to meet current compliance requirements. Missy Sabins, MD 03/26/2016 9:49:21 AM This report has been signed electronically. Number of Addenda: 0

## 2016-03-26 NOTE — Care Management Note (Signed)
Case Management Note  Patient Details  Name: Joel [name] MRN: [id number] Date of Birth: January 10, 1933  Subjective/Objective:                  83 yo male with GI bleed in Endoscopy for outpatient ESOPHAGOGASTRODUODENOSCOPY  Action/Plan: Follow for disposition needs.   Expected Discharge Date:  03/26/16               Expected Discharge Plan:  Home/Self Care  In-House Referral:     Discharge planning Services  CM Consult  Post Acute Care Choice:  NA Choice offered to:  Patient  DME Arranged:  N/A DME Agency:  NA  HH Arranged:  NA HH Agency:  NA  Status of Service:  Completed, signed off  If discussed at [place] of Stay Meetings, dates discussed:    Additional Comments: EDCM spoke to pt and friend at bedside regarding home health services and [facility] services.  Pt states he occasionally drives and does a little cooking for himself; he has a rolling walker and cane (prefers cane) to get around the house.  Pt admits that he fell recently because he tried to ambulate without DME.   Being that pt is NOT homebound- he will not likely qualify for home health services.  EDCM gave pt brochures for [facility] agencies and [facility] to contact for more information.  EDCM also gave pt Meals on Wheels number as requested by pt. No further CM needs presented at this time. Fuller Mandril, RN 03/26/2016, 10:56 AM

## 2016-03-26 NOTE — Anesthesia Preprocedure Evaluation (Addendum)
Anesthesia Evaluation  Patient identified by MRN, date of birth, ID band Patient awake    Reviewed: Allergy & Precautions, NPO status , Patient's Chart, lab work & pertinent test results  Airway Mallampati: II  TM Distance: >3 FB     Dental   Pulmonary sleep apnea , COPD, former smoker,    breath sounds clear to auscultation       Cardiovascular hypertension, +CHF   Rhythm:Regular Rate:Normal     Neuro/Psych    GI/Hepatic negative GI ROS, Neg liver ROS,   Endo/Other  diabetes  Renal/GU negative Renal ROS     Musculoskeletal   Abdominal   Peds  Hematology   Anesthesia Other Findings   Reproductive/Obstetrics                            Anesthesia Physical Anesthesia Plan  ASA: III  Anesthesia Plan: MAC   Post-op Pain Management:    Induction: Intravenous  Airway Management Planned: Simple Face Mask  Additional Equipment:   Intra-op Plan:   Post-operative Plan:   Informed Consent:   Dental advisory given  Plan Discussed with: CRNA and Anesthesiologist  Anesthesia Plan Comments:         Anesthesia Quick Evaluation

## 2016-03-26 NOTE — H&P (View-Only) (Signed)
  Radiation Oncology         [phone] ________________________________  Name: Joel [name] MRN: [id number]  Date: 03/19/2016  DOB: [birth year]  SIMULATION AND TREATMENT PLANNING NOTE    ICD-9-CM ICD-10-CM   1. Malignant neoplasm of prostate ([place]) 185 C61     DIAGNOSIS:  83 y.o. gentleman with stage T2a adenocarcinoma of the prostate with a Gleason's score of 4+4 and a PSA of 9.90  We cancelled simulation today to await upcoming upper GI intended to assess GI bleed prior to starting XRT  ________________________________  Sheral Apley Joel [name], M.D.

## 2016-03-26 NOTE — Transfer of Care (Signed)
Immediate Anesthesia Transfer of Care Note  Patient: Joel [name]  Procedure(s) Performed: Procedure(s) with comments: ESOPHAGOGASTRODUODENOSCOPY (EGD) (N/A) - would like to use ultraslim scope  Patient Location: Endoscopy Unit  Anesthesia Type:MAC  Level of Consciousness: awake, oriented, sedated, patient cooperative and responds to stimulation  Airway & Oxygen Therapy: Patient Spontanous Breathing and Patient connected to nasal cannula oxygen  Post-op Assessment: Report given to RN, Post -op Vital signs reviewed and stable, Patient moving all extremities and Patient moving all extremities X 4  Post vital signs: Reviewed and stable  Last Vitals:  Vitals:   03/26/16 0829 03/26/16 1000  BP: (!) 112/52 (!) 86/45  Pulse: 75 69  Resp: 18 (!) 21  Temp: 36.3 C 36.4 C    Last Pain:  Vitals:   03/26/16 1000  TempSrc: Oral         Complications: No apparent anesthesia complications

## 2016-03-29 ENCOUNTER — Encounter (HOSPITAL_COMMUNITY): Payer: Self-pay | Admitting: Gastroenterology

## 2016-03-29 ENCOUNTER — Other Ambulatory Visit: Payer: Self-pay | Admitting: *Deleted

## 2016-03-29 ENCOUNTER — Ambulatory Visit (INDEPENDENT_AMBULATORY_CARE_PROVIDER_SITE_OTHER): Payer: PPO | Admitting: Physician Assistant

## 2016-03-29 VITALS — BP 123/70 | HR 71 | Ht 72.0 in | Wt 226.0 lb

## 2016-03-29 DIAGNOSIS — I5032 Chronic diastolic (congestive) heart failure: Secondary | ICD-10-CM | POA: Diagnosis not present

## 2016-03-29 DIAGNOSIS — D509 Iron deficiency anemia, unspecified: Secondary | ICD-10-CM | POA: Diagnosis not present

## 2016-03-29 DIAGNOSIS — I350 Nonrheumatic aortic (valve) stenosis: Secondary | ICD-10-CM

## 2016-03-29 DIAGNOSIS — Z7901 Long term (current) use of anticoagulants: Secondary | ICD-10-CM

## 2016-03-29 DIAGNOSIS — I482 Chronic atrial fibrillation, unspecified: Secondary | ICD-10-CM

## 2016-03-29 NOTE — Patient Instructions (Signed)
Medication Instructions:   Your physician recommends that you continue on your current medications as directed. Please refer to the Current Medication list given to you today.   If you need a refill on your cardiac medications before your next appointment, please call your pharmacy.  Labwork:NONE ORDERED  TODAY    Testing/Procedures: NONE ORDERED  TODAY    Follow-Up:Your physician wants you to follow-up in:  IN  6  MONTHS WITH DR HOCHREIN You will receive a reminder letter in the mail two months in advance. If you don't receive a letter, please call our office to schedule the follow-up appointment.      Any Other Special Instructions Will Be Listed Below (If Applicable).                                                                                                                                                   

## 2016-03-29 NOTE — Progress Notes (Signed)
Cardiology Office Note   Date:  03/29/2016   ID:  Joel [name], DOB May 31, 1932, MRN [id number]  PCP:  Wenda Low, MD  Cardiologist:  Dr Percival Spanish 10/24/2015  Rosaria Ferries, PA-C 07/22/2015  Chief Complaint  Patient presents with  . Follow-up    feet swelling    History of Present Illness: Joel [name] is a 83 y.o. male with a history of allergic rhinitis, anemia, bipolar, COPD on O2, DM, HLD, HTN, gout, prostate CA s/p seed implant, chronic atrial fib, CHA2DS2VASc=4  (age x 2, DM, HTN)  Admit 1/25-02/01 with upper GI bleed (H&H 7.5/25.1), transfused, EGD with no source, capsule endoscopy with single nonbleeding AVM, anticoagulation changed to aspirin only 02/23 upper endoscopy with no abnormalities and no bleeding source found  [place] presents for cardiology follow up  He is still feeling weak from the bleeding. He is struggling some with daily tasks. He is off the testosterone, which also makes him feel weaker.   His legs have not been swelling. His breathing is ok except when he gets tired. He is using the O2 and connects it to his CPAP machine.   He does not have palpitations. He thinks he would know if he is atrial fib, but that is because he would feel tired. However, with all the reasons he has to feel tired and weak, he can't tell if any sx are due to atrial fib.   There is a woman chasing him, she is causing some stress.  The prostate CA is greatly on his mind. It is an aggressive CA. He had an injection to reverse the testosterone that will last about 6 months. He is to have XRT for the CA, is to start this when his blood counts improve.    Past Medical History:  Diagnosis Date  . Allergic rhinitis   . Anemia   . Anxiety   . Aortic stenosis 2012   mild to mod   . Atrial fibrillation ([place]) 1991   with multiple DCCV  . Bipolar affective disorder ([place])   . COPD (chronic obstructive pulmonary disease) ([place])   . Diabetic neuropathy ([place])    in  feet  . Diabetic neuropathy ([place])   . Dyslipidemia   . Fatigue    last year or so  . Gout   . Hypertension   . Insomnia   . Obesity   . On home oxygen therapy 2 1/2 liters with bipap at night  . OSA (obstructive sleep apnea)    severe, uses bipap 16 1/2 by 12 or 13 setting  . Prostate cancer ([place])   . Rectal bleeding 12/16/2015  . Type 2 diabetes mellitus ([place])   . Vertigo     Past Surgical History:  Procedure Laterality Date  . CARDIOVERSION  yrs ago   prior to 1998  . COLONOSCOPY WITH PROPOFOL N/A 01/23/2013   Procedure: COLONOSCOPY WITH PROPOFOL;  Surgeon: Garlan Fair, MD;  Location: [facility] ENDOSCOPY;  Service: Endoscopy;  Laterality: N/A;  . electro shock  1969   for depression  . ESOPHAGOGASTRODUODENOSCOPY N/A 03/03/2016   Procedure: ESOPHAGOGASTRODUODENOSCOPY (EGD);  Surgeon: Teena Irani, MD;  Location: [facility];  Service: Endoscopy;  Laterality: N/A;  . ESOPHAGOGASTRODUODENOSCOPY N/A 03/26/2016   Procedure: ESOPHAGOGASTRODUODENOSCOPY (EGD);  Surgeon: Teena Irani, MD;  Location[facility] ENDOSCOPY;  Service: Endoscopy;  Laterality: N/A;  would like to use ultraslim scope  . ESOPHAGOGASTRODUODENOSCOPY (EGD) WITH PROPOFOL N/A 01/23/2013   Procedure: ESOPHAGOGASTRODUODENOSCOPY (EGD) WITH PROPOFOL;  Surgeon:  Garlan Fair, MD;  Location: [facility];  Service: Endoscopy;  Laterality: N/A;  . GIVENS CAPSULE STUDY N/A 03/03/2016   Procedure: GIVENS CAPSULE STUDY;  Surgeon: Teena Irani, MD;  Location: [facility] ENDOSCOPY;  Service: Endoscopy;  Laterality: N/A;  . HEMORRHOID SURGERY N/A 12/16/2015   Procedure: PROCTOSCOPY WITH CONTROL OF BLEEDING;  Surgeon: Fanny Skates, MD;  Location: [facility];  Service: General;  Laterality: N/A;  . KNEE SURGERY  1970's   rt  . MAZE  1998  . PROSTATE BIOPSY    . SHOULDER SURGERY  7-8 yrs ago   rt    Current Outpatient Prescriptions  Medication Sig Dispense Refill  . allopurinol (ZYLOPRIM) 100 MG tablet Take 100 mg by mouth daily.     Marland Kitchen aspirin EC  325 MG tablet Take 1 tablet (325 mg total) by mouth daily. 30 tablet 0  . buPROPion (WELLBUTRIN XL) 150 MG 24 hr tablet Take 1 tablet (150 mg total) by mouth daily. 30 tablet 1  . CARTIA XT 120 MG 24 hr capsule Take 120 mg by mouth daily.    . citalopram (CELEXA) 40 MG tablet Take 20 mg by mouth daily.    Marland Kitchen dextromethorphan-guaiFENesin (MUCINEX DM) 30-600 MG 12hr tablet Take 1 tablet by mouth 2 (two) times daily.    . fluticasone (FLONASE) 50 MCG/ACT nasal spray Place 2 sprays into both nostrils daily. 16 g 1  . furosemide (LASIX) 80 MG tablet Take 80 mg by mouth daily.     . iron polysaccharides (NIFEREX) 150 MG capsule Take 1 capsule (150 mg total) by mouth 2 (two) times daily. 60 capsule 0  . JANUVIA 100 MG tablet Take 1 tablet by mouth daily.  1  . lovastatin (MEVACOR) 20 MG tablet Take 20 mg by mouth daily at 6 PM.     . meclizine (ANTIVERT) 25 MG tablet Take 1 tablet (25 mg total) by mouth 3 (three) times daily as needed for dizziness. 30 tablet 0  . metFORMIN (GLUCOPHAGE) 1000 MG tablet Take 1,000 mg by mouth 2 (two) times daily.   0  . OVER THE COUNTER MEDICATION Take 1 tablet by mouth 2 (two) times daily. OTC Vision supplement    . potassium chloride SA (K-DUR,KLOR-CON) 20 MEQ tablet Take 1 tablet by mouth daily.  0  . temazepam (RESTORIL) 15 MG capsule Take 15 mg by mouth at bedtime.     . valsartan (DIOVAN) 160 MG tablet Take 160 mg by mouth every morning.      No current facility-administered medications for this visit.     Allergies:   Penicillins; Antihistamines, loratadine-type; and Other    Social History:  The patient  reports that he quit smoking about 39 years ago. His smoking use included Cigarettes. He has a 50.00 pack-year smoking history. He has never used smokeless tobacco. He reports that he does not drink alcohol or use drugs.   Family History:  The patient's family history includes Tuberculosis in his maternal grandmother.    ROS:  Please see the history of  present illness. All other systems are reviewed and negative.    PHYSICAL EXAM: VS:  BP 123/70   Pulse 71   Ht 6' (1.829 m)   Wt 226 lb (102.5 kg)   BMI 30.65 kg/m  , BMI Body mass index is 30.65 kg/m. GEN: Well nourished, well developed, male in no acute distress  HEENT: normal for age  Neck: no JVD, no carotid bruit, no masses Cardiac: slightly irregular R&R;  2/6 murmur, no rubs, or gallops Respiratory:  clear to auscultation bilaterally, normal work of breathing GI: soft, nontender, nondistended, + BS MS: no deformity or atrophy; no edema; distal pulses are 2+ in all 4 extremities   Skin: warm and dry, no rash Neuro:  Strength and sensation are intact Psych: euthymic mood, full affect   EKG:  EKG is not ordered today.  Recent Labs: 06/28/2015: B Natriuretic Peptide 57.0 12/17/2015: Magnesium 2.1 02/27/2016: ALT 11 02/28/2016: BUN 14; Creatinine, Ser 1.10; Potassium 4.3; Sodium 138 03/02/2016: Hemoglobin 8.6; Platelets 186    Lipid Panel    Component Value Date/Time   CHOL 102 06/29/2015 0534   TRIG 108 06/29/2015 0534   HDL 31 (L) 06/29/2015 0534   CHOLHDL 3.3 06/29/2015 0534   VLDL 22 06/29/2015 0534   LDLCALC 49 06/29/2015 0534     Wt Readings from Last 3 Encounters:  03/29/16 226 lb (102.5 kg)  03/26/16 225 lb (102.1 kg)  02/27/16 244 lb 4.3 oz (110.8 kg)     Other studies Reviewed: Additional studies/ records that were reviewed today include: office notes, hospital records and testing.  ASSESSMENT AND PLAN:  1.  Atrial fib: he is not aware of it and his HR is good. No med changes  2. Anitcoag: At this time, pt feels the risks of oral anticoagulation outweigh the benefits. INR was 2.93 on admission for his GIB. He is to continue the ASA.  **If anticoagulation is resumed, consider coumadin with a goal INR of 1.8-2.5.   3. Mod AS: S2 is clearly heard, no change in symptoms. Follow up with Dr Percival Spanish in 6 months. No LE edema on the current Lasix dose of 80  mg qd. Renal function was checked in January and was normal. He is having no edema (wears compression socks).  4. DOE, diast CHF: Chronic and no change in sx. Weight is down quite a bit. Follow.   Current medicines are reviewed at length with the patient today.  The patient does not have concerns regarding medicines.  The following changes have been made:  no change  Labs/ tests ordered today include:  No orders of the defined types were placed in this encounter.   Disposition:   FU with Dr Percival Spanish  Signed, Rosaria Ferries, PA-C  03/29/2016 12:05 PM    [place] Phone: [phone]; Fax: [phone]  This note was written with the assistance of speech recognition software. Please excuse any transcriptional errors.

## 2016-03-29 NOTE — Patient Outreach (Signed)
[place] [facility]) Care Management  03/29/2016  Joel [name] [birth year] [id number]   Initial outreach attempt unsuccessful and RN unable to leave a HIPAA approved voice message. Will attempt once again to contact this patient for [facility] services tomorrow.   Raina Mina, RN Care Management Coordinator [place] Office [phone]

## 2016-03-30 ENCOUNTER — Encounter: Payer: Self-pay | Admitting: *Deleted

## 2016-03-30 ENCOUNTER — Other Ambulatory Visit (HOSPITAL_COMMUNITY): Payer: Self-pay | Admitting: *Deleted

## 2016-03-30 ENCOUNTER — Other Ambulatory Visit: Payer: Self-pay | Admitting: *Deleted

## 2016-03-30 DIAGNOSIS — D6489 Other specified anemias: Secondary | ICD-10-CM

## 2016-03-30 NOTE — Patient Outreach (Signed)
[facility]) Care Management  03/30/2016  Joel [name] 13-Feb-1932 [id number]  RN attempted the second outreach call and was able to leave a HIPAA approved voice message requesting a call back. Will inquire further on possible [facility] services at that time.  Will reschedule another outreach call for services.  Raina Mina, RN Care Management Coordinator [place] Office [phone]

## 2016-03-30 NOTE — Patient Outreach (Signed)
[facility]) Care Management  03/30/2016  PETERJOHN STAVIG December 25, 1932 [id number]  Pt returned a call to RN case manager and RN introduced the [facility] services and purpose for today's call. Pt receptive and explained his current issues with low hemoglobin levels that will require a visit to the hospital for blood transfusions. Pt currently awaiting instructions from his providers on the location and appointed time to arrive. Pt states he "keeps dropping" on his hemoglobin and his providers are not sure of the cause. Pt states this derived from a history of prostate cancer. This has been an ongoing process and pt states he will continue to go to all his follow up appointments for a possible solution.   Pt discussed other medical issues however states they are under control. Reported diabetes with CBG ranging from 125-130 and EPIC reports his last A1C at 7.2 on 5/28 2017 with the next scheduled lab draw for 04/02/2016 with Dr. Tammi Klippel (oncology). RN inquired further on possible assistance needed as pt reports his daily routine and resources. RN discussed mobile meals as pt receptive to this referral along with possible consult with a [facility] social worker for other needs. Pt states he has hired help for cleaning his home and a friend that walks his dog but realizes he is getting more limited in what he is able to do. States his wife passed away 2 1/2 years ago and he has been maintain his care since that time. RN was able to obtain some information from the initial assessment however was not able to finish and was not able to review pt's medication adherence. There was a discussion on other medical history the pt has which included but not limited to HTN/HF/COPD/Atria Fibrillation however RN was not able to obtain detail information due to pt's limited time on the phone. RN offered a home visit this week however pt has several provider visits and agreed to a home visit for next week. Will schedule a home  visit appointment and finish the initial assessment along with a program and list goals accordingly at this time. Note RN made a referral for social worker involvement for community resources and referrals.   Raina Mina, RN Care Management Coordinator [place] Office [phone]

## 2016-03-31 ENCOUNTER — Encounter: Payer: Self-pay | Admitting: *Deleted

## 2016-03-31 ENCOUNTER — Ambulatory Visit: Payer: Self-pay | Admitting: *Deleted

## 2016-03-31 ENCOUNTER — Ambulatory Visit (HOSPITAL_COMMUNITY)
Admission: RE | Admit: 2016-03-31 | Discharge: 2016-03-31 | Disposition: A | Discharge status: Home / Self Care | Payer: PPO | Source: Ambulatory Visit | Attending: Gastroenterology | Admitting: Gastroenterology

## 2016-03-31 DIAGNOSIS — D509 Iron deficiency anemia, unspecified: Secondary | ICD-10-CM | POA: Insufficient documentation

## 2016-03-31 MED ORDER — SODIUM CHLORIDE 0.9 % IV SOLN
Freq: Once | INTRAVENOUS | Status: DC
Start: 2016-03-31 — End: 2016-04-01

## 2016-04-01 ENCOUNTER — Ambulatory Visit: Payer: PPO | Admitting: *Deleted

## 2016-04-01 ENCOUNTER — Other Ambulatory Visit: Payer: Self-pay | Admitting: Radiation Oncology

## 2016-04-01 LAB — TYPE AND SCREEN
ABO/RH(D): O POS
Antibody Screen: NEGATIVE
UNIT DIVISION: 0
Unit division: 0

## 2016-04-01 LAB — BPAM RBC
Blood Product Expiration Date: 201803172359
Blood Product Expiration Date: 201803202359
ISSUE DATE / TIME: 201802280937
ISSUE DATE / TIME: 201802281141
UNIT TYPE AND RH: 5100
Unit Type and Rh: 5100

## 2016-04-02 ENCOUNTER — Ambulatory Visit
Admission: RE | Admit: 2016-04-02 | Discharge: 2016-04-02 | Disposition: A | Discharge status: Home / Self Care | Payer: PPO | Source: Ambulatory Visit | Attending: Radiation Oncology | Admitting: Radiation Oncology

## 2016-04-02 ENCOUNTER — Other Ambulatory Visit: Payer: Self-pay | Admitting: *Deleted

## 2016-04-02 DIAGNOSIS — Z7984 Long term (current) use of oral hypoglycemic drugs: Secondary | ICD-10-CM | POA: Diagnosis not present

## 2016-04-02 DIAGNOSIS — C61 Malignant neoplasm of prostate: Secondary | ICD-10-CM

## 2016-04-02 DIAGNOSIS — E119 Type 2 diabetes mellitus without complications: Secondary | ICD-10-CM | POA: Diagnosis not present

## 2016-04-02 DIAGNOSIS — Z923 Personal history of irradiation: Secondary | ICD-10-CM | POA: Diagnosis not present

## 2016-04-02 DIAGNOSIS — Z9889 Other specified postprocedural states: Secondary | ICD-10-CM | POA: Diagnosis not present

## 2016-04-02 DIAGNOSIS — Z79899 Other long term (current) drug therapy: Secondary | ICD-10-CM | POA: Diagnosis not present

## 2016-04-02 DIAGNOSIS — Z880 Allergy status to penicillin: Secondary | ICD-10-CM | POA: Diagnosis not present

## 2016-04-02 DIAGNOSIS — Z7901 Long term (current) use of anticoagulants: Secondary | ICD-10-CM | POA: Diagnosis not present

## 2016-04-02 DIAGNOSIS — Z87891 Personal history of nicotine dependence: Secondary | ICD-10-CM | POA: Diagnosis not present

## 2016-04-02 NOTE — Patient Outreach (Signed)
[place] [facility]) Care Management  04/02/2016  Joel [name] 04-09-1932 [id number]   CSW made an initial attempt to try and contact patient today to perform phone assessment, as well as assess and assist with social needs and services, without success.  A HIPAA complaint message was left for patient on voicemail.  CSW is currently awaiting a return call.  CSW will make a second outreach attempt in one week, if CSW does not receive a return call from patient in the meantime. Nat Christen, BSW, MSW, LCSW  Licensed Education officer, environmental Health System  Mailing [place] [address] [place], [place] [address] Physical Address-[address], [place], [place] [address] Toll Free Main # [phone] Fax # [phone] Cell # [phone]  Office # [phone] [name]

## 2016-04-02 NOTE — Progress Notes (Signed)
  Radiation Oncology         [phone] ________________________________  Name: Joel [name] MRN: [id number]  Date: 04/02/2016  DOB: 04/25/1932  SIMULATION AND TREATMENT PLANNING NOTE    ICD-9-CM ICD-10-CM   1. Malignant neoplasm of prostate ([place]) 185 C61     DIAGNOSIS:   83 y.o. gentleman with stage T2a adenocarcinoma of the prostate with a Gleason's score of 4+4 and a PSA of 9.90  NARRATIVE:  The patient was brought to the [facility].  Identity was confirmed.  All relevant records and images related to the planned course of therapy were reviewed.  The patient freely provided informed written consent to proceed with treatment after reviewing the details related to the planned course of therapy. The consent form was witnessed and verified by the simulation staff.  Then, the patient was set-up in a stable reproducible supine position for radiation therapy.  A vacuum lock pillow device was custom fabricated to position his legs in a reproducible immobilized position.  Then, I performed a urethrogram under sterile conditions to identify the prostatic apex.  CT images were obtained.  Surface markings were placed.  The CT images were loaded into the planning software.  Then the prostate target and avoidance structures including the rectum, bladder, bowel and hips were contoured.  Treatment planning then occurred.  The radiation prescription was entered and confirmed.  A total of 1 complex treatment devices were fabricated. I have requested : Intensity Modulated Radiotherapy (IMRT) is medically necessary for this case for the following reason:  Rectal sparing.Marland Kitchen  PLAN:  The patient will receive 75 Gy in 40 fractions with 45 Gy to pelvic nodes then boost.  ________________________________  Sheral Apley. Tammi Klippel, M.D.

## 2016-04-05 ENCOUNTER — Other Ambulatory Visit: Payer: Self-pay | Admitting: *Deleted

## 2016-04-05 ENCOUNTER — Ambulatory Visit: Payer: PPO | Admitting: *Deleted

## 2016-04-05 NOTE — Patient Outreach (Signed)
[place] [facility]) Care Management  04/05/2016  Joel [name] 1933/01/03 [id number]   RN attempted outreach follow up however pt not available. RN able to leave a HIPAA approved voice message requesting a call back. Will further address at that time. Note upcoming home visit scheduled for this week on 04/08/2016 as RN will completed the initial assessment and address plan of care and goals.  Raina Mina, RN Care Management Coordinator [place] Office [phone]

## 2016-04-06 DIAGNOSIS — C61 Malignant neoplasm of prostate: Secondary | ICD-10-CM | POA: Insufficient documentation

## 2016-04-06 DIAGNOSIS — Z510 Encounter for antineoplastic radiation therapy: Secondary | ICD-10-CM | POA: Insufficient documentation

## 2016-04-08 ENCOUNTER — Other Ambulatory Visit: Payer: Self-pay | Admitting: *Deleted

## 2016-04-08 DIAGNOSIS — D509 Iron deficiency anemia, unspecified: Secondary | ICD-10-CM | POA: Diagnosis not present

## 2016-04-08 NOTE — Patient Outreach (Signed)
[place]) Care Management   04/08/2016  Joel [name] May 25, 1932 [id number]  Joel [name] is an 83 y.o. male  Subjective ANEMIA: Pt discussed all his medical issues and indicated issues related to his anemia related to his H/H levels that drop and pt request transfusion on many occassions. Pt receptive to educatjon on anemia in order to further manage this medical condition. Other information pt was receptive to was atrial fibrillation. MEDICATION: Pt presences all his medications and states on occassions if not prescribed iron he takes it OTC with his providers awareness. Pt able to afford all his medications and continues to fill his pillbox however this task is getting difficult COMMUNITY RESOURCES: Pt currently awaiting for [facility] social worker to follow up with the initial referral. Pt states the social has not contacted him yet.  Pt states it is getting difficult to prepare his meals due to his lack of energy when his H/H levels are low.   Objective:   Review of Systems  Constitutional: Negative.   HENT: Negative.   Eyes: Negative.   Respiratory: Negative.   Cardiovascular: Negative.   Gastrointestinal: Negative.   Genitourinary: Negative.   Musculoskeletal: Negative.   Skin: Negative.   Neurological: Negative.   Endo/Heme/Allergies: Negative.   Psychiatric/Behavioral: Negative.     Physical Exam  Constitutional: He is oriented to person, place, and time. He appears well-developed and well-nourished.  HENT:  Right Ear: External ear normal.  Left Ear: External ear normal.  Neck: Normal range of motion.  Cardiovascular: Normal heart sounds.   Respiratory: Effort normal and breath sounds normal.  GI: Soft. Bowel sounds are normal.  Musculoskeletal: Normal range of motion.  Neurological: He is alert and oriented to person, place, and time.  Skin: Skin is warm and dry.  Psychiatric: He has a normal mood and affect. His behavior is normal. Judgment and  thought content normal.    Encounter Medications:   Outpatient Encounter Prescriptions as of 04/08/2016  Medication Sig  . allopurinol (ZYLOPRIM) 100 MG tablet Take 100 mg by mouth daily.   Marland Kitchen aspirin EC 325 MG tablet Take 1 tablet (325 mg total) by mouth daily.  Marland Kitchen buPROPion (WELLBUTRIN XL) 150 MG 24 hr tablet Take 1 tablet (150 mg total) by mouth daily.  Marland Kitchen CARTIA XT 120 MG 24 hr capsule Take 120 mg by mouth daily.  . citalopram (CELEXA) 40 MG tablet Take 20 mg by mouth daily.  Marland Kitchen dextromethorphan-guaiFENesin (MUCINEX DM) 30-600 MG 12hr tablet Take 1 tablet by mouth 2 (two) times daily.  . fluticasone (FLONASE) 50 MCG/ACT nasal spray Place 2 sprays into both nostrils daily.  . furosemide (LASIX) 80 MG tablet Take 80 mg by mouth daily.   . iron polysaccharides (NIFEREX) 150 MG capsule Take 1 capsule (150 mg total) by mouth 2 (two) times daily. (Patient taking differently: Take 150 mg by mouth daily. )  . JANUVIA 100 MG tablet Take 1 tablet by mouth daily.  Marland Kitchen lovastatin (MEVACOR) 20 MG tablet Take 20 mg by mouth daily at 6 PM.   . meclizine (ANTIVERT) 25 MG tablet Take 1 tablet (25 mg total) by mouth 3 (three) times daily as needed for dizziness.  . metFORMIN (GLUCOPHAGE) 1000 MG tablet Take 1,000 mg by mouth 2 (two) times daily.   Marland Kitchen OVER THE COUNTER MEDICATION Take 1 tablet by mouth 2 (two) times daily. OTC Vision supplement  . potassium chloride SA (K-DUR,KLOR-CON) 20 MEQ tablet Take 1 tablet by mouth daily.  Marland Kitchen  temazepam (RESTORIL) 15 MG capsule Take 15 mg by mouth at bedtime.   . valsartan (DIOVAN) 160 MG tablet Take 160 mg by mouth every morning.    No facility-administered encounter medications on file as of 04/08/2016.     Functional Status:   In your present state of health, do you have any difficulty performing the following activities: 04/08/2016 03/26/2016  Hearing? N -  Vision? N -  Difficulty concentrating or making decisions? N -  Walking or climbing stairs? N -  Dressing or  bathing? N -  Doing errands, shopping? N N  Preparing Food and eating ? N -  Using the Toilet? N -  In the past six months, have you accidently leaked urine? N -  Do you have problems with loss of bowel control? N -  Managing your Medications? N -  Managing your Finances? N -  Housekeeping or managing your Housekeeping? Y -  Some recent data might be hidden   BP (!) 104/54 (BP Location: Left Arm, Patient Position: Sitting, Cuff Size: Normal)   Pulse 76   Resp 20   Ht 1.829 m (6')   Wt 220 lb (99.8 kg)   SpO2 93%   BMI 29.84 kg/m  Fall/Depression Screening:    PHQ 2/9 Scores 03/30/2016 01/07/2016 01/07/2016  PHQ - 2 Score 0 0 0    Assessment:   Enrollment into the [facility] program Education related to anemia/atrial fibrillation Medication adherence Community resources related to referral via [facility] social worker  Plan:  Will confirm pt's enrollment with patient identifiers and obtain a sign consent for [facility] enrollment. Will completed a physical assessment and report any abnormal findings to pt's primary provider.Will discuss pt's current medical condition (anemia) and the treatment plan with his providers for ongoing adherence. Will educate pt on anemia and provided printed material via [facility] print outs for the following: Anemia caused by low iron, Anemia of chronic disease and Arailt Fibrillation.  All material reviewed and left with pt for ongoing increase in his knowledge base related to his medical conditions. Will stress the importance of safety in the home when his levels are low and verify pt is aware when to contact his providers for interventions on his hemoglobin.  Other discussions related to pt's atrial fibrillation which was discussed along with FAST related to stroke information.  Will reviewed and verify pt's understanding of all his medications and that pt has a sufficient supple for ongoing adherence. Will discussed possible needs and verify pt's has a pending appointment for the  requested meal services via [facility] social worker. Currently pending a call from the social worker.  Based upon the information gathered today RN will discuss a plan of care and goals to continue to keep pt safe in his home and ability to increase his knowledge base in managing his care as independent as he can with the available resources and assistance [facility] can provide. Will alert pt's primary caregiver of [facility] involvement.  Raina Mina, RN Care Management Coordinator [place] Office [phone]

## 2016-04-09 ENCOUNTER — Encounter: Payer: Self-pay | Admitting: *Deleted

## 2016-04-12 ENCOUNTER — Telehealth: Payer: Self-pay

## 2016-04-12 ENCOUNTER — Other Ambulatory Visit: Payer: Self-pay | Admitting: *Deleted

## 2016-04-12 ENCOUNTER — Ambulatory Visit: Payer: PPO | Admitting: *Deleted

## 2016-04-12 ENCOUNTER — Telehealth: Payer: Self-pay | Admitting: Radiation Oncology

## 2016-04-12 ENCOUNTER — Inpatient Hospital Stay (HOSPITAL_COMMUNITY)
Admission: AD | Admit: 2016-04-12 | Discharge: 2016-04-14 | DRG: 378 | Disposition: A | Discharge status: Home / Self Care | Payer: PPO | Source: Ambulatory Visit | Attending: Internal Medicine | Admitting: Internal Medicine

## 2016-04-12 DIAGNOSIS — Z9889 Other specified postprocedural states: Secondary | ICD-10-CM | POA: Diagnosis not present

## 2016-04-12 DIAGNOSIS — K625 Hemorrhage of anus and rectum: Secondary | ICD-10-CM | POA: Diagnosis present

## 2016-04-12 DIAGNOSIS — K31819 Angiodysplasia of stomach and duodenum without bleeding: Secondary | ICD-10-CM | POA: Diagnosis not present

## 2016-04-12 DIAGNOSIS — I10 Essential (primary) hypertension: Secondary | ICD-10-CM | POA: Diagnosis present

## 2016-04-12 DIAGNOSIS — E118 Type 2 diabetes mellitus with unspecified complications: Secondary | ICD-10-CM

## 2016-04-12 DIAGNOSIS — I480 Paroxysmal atrial fibrillation: Secondary | ICD-10-CM | POA: Diagnosis not present

## 2016-04-12 DIAGNOSIS — I272 Pulmonary hypertension, unspecified: Secondary | ICD-10-CM | POA: Diagnosis not present

## 2016-04-12 DIAGNOSIS — G4733 Obstructive sleep apnea (adult) (pediatric): Secondary | ICD-10-CM | POA: Diagnosis present

## 2016-04-12 DIAGNOSIS — D509 Iron deficiency anemia, unspecified: Secondary | ICD-10-CM | POA: Diagnosis not present

## 2016-04-12 DIAGNOSIS — K921 Melena: Secondary | ICD-10-CM | POA: Diagnosis not present

## 2016-04-12 DIAGNOSIS — J309 Allergic rhinitis, unspecified: Secondary | ICD-10-CM | POA: Diagnosis not present

## 2016-04-12 DIAGNOSIS — E785 Hyperlipidemia, unspecified: Secondary | ICD-10-CM | POA: Diagnosis not present

## 2016-04-12 DIAGNOSIS — Z888 Allergy status to other drugs, medicaments and biological substances status: Secondary | ICD-10-CM

## 2016-04-12 DIAGNOSIS — Z79899 Other long term (current) drug therapy: Secondary | ICD-10-CM | POA: Diagnosis not present

## 2016-04-12 DIAGNOSIS — D500 Iron deficiency anemia secondary to blood loss (chronic): Secondary | ICD-10-CM | POA: Diagnosis not present

## 2016-04-12 DIAGNOSIS — E784 Other hyperlipidemia: Secondary | ICD-10-CM

## 2016-04-12 DIAGNOSIS — Z880 Allergy status to penicillin: Secondary | ICD-10-CM | POA: Diagnosis not present

## 2016-04-12 DIAGNOSIS — Z87891 Personal history of nicotine dependence: Secondary | ICD-10-CM | POA: Diagnosis not present

## 2016-04-12 DIAGNOSIS — E1140 Type 2 diabetes mellitus with diabetic neuropathy, unspecified: Secondary | ICD-10-CM | POA: Diagnosis not present

## 2016-04-12 DIAGNOSIS — Z7951 Long term (current) use of inhaled steroids: Secondary | ICD-10-CM | POA: Diagnosis not present

## 2016-04-12 DIAGNOSIS — Z9981 Dependence on supplemental oxygen: Secondary | ICD-10-CM

## 2016-04-12 DIAGNOSIS — G4700 Insomnia, unspecified: Secondary | ICD-10-CM | POA: Diagnosis present

## 2016-04-12 DIAGNOSIS — I350 Nonrheumatic aortic (valve) stenosis: Secondary | ICD-10-CM

## 2016-04-12 DIAGNOSIS — E662 Morbid (severe) obesity with alveolar hypoventilation: Secondary | ICD-10-CM | POA: Diagnosis not present

## 2016-04-12 DIAGNOSIS — Z6829 Body mass index (BMI) 29.0-29.9, adult: Secondary | ICD-10-CM

## 2016-04-12 DIAGNOSIS — F319 Bipolar disorder, unspecified: Secondary | ICD-10-CM

## 2016-04-12 DIAGNOSIS — Z510 Encounter for antineoplastic radiation therapy: Secondary | ICD-10-CM | POA: Diagnosis not present

## 2016-04-12 DIAGNOSIS — J449 Chronic obstructive pulmonary disease, unspecified: Secondary | ICD-10-CM | POA: Diagnosis not present

## 2016-04-12 DIAGNOSIS — K922 Gastrointestinal hemorrhage, unspecified: Secondary | ICD-10-CM

## 2016-04-12 DIAGNOSIS — E119 Type 2 diabetes mellitus without complications: Secondary | ICD-10-CM

## 2016-04-12 DIAGNOSIS — K31811 Angiodysplasia of stomach and duodenum with bleeding: Secondary | ICD-10-CM | POA: Diagnosis not present

## 2016-04-12 DIAGNOSIS — J431 Panlobular emphysema: Secondary | ICD-10-CM | POA: Diagnosis present

## 2016-04-12 DIAGNOSIS — C61 Malignant neoplasm of prostate: Secondary | ICD-10-CM | POA: Diagnosis present

## 2016-04-12 DIAGNOSIS — Z7984 Long term (current) use of oral hypoglycemic drugs: Secondary | ICD-10-CM | POA: Diagnosis not present

## 2016-04-12 DIAGNOSIS — M109 Gout, unspecified: Secondary | ICD-10-CM | POA: Diagnosis not present

## 2016-04-12 DIAGNOSIS — Z8546 Personal history of malignant neoplasm of prostate: Secondary | ICD-10-CM

## 2016-04-12 LAB — GLUCOSE, CAPILLARY
GLUCOSE-CAPILLARY: 79 mg/dL (ref 65–99)
Glucose-Capillary: 147 mg/dL — ABNORMAL HIGH (ref 65–99)

## 2016-04-12 LAB — TSH: TSH: 4.539 u[IU]/mL — AB (ref 0.350–4.500)

## 2016-04-12 LAB — CBC WITH DIFFERENTIAL/PLATELET
BASOS PCT: 1 %
Basophils Absolute: 0 10*3/uL (ref 0.0–0.1)
EOS ABS: 0.2 10*3/uL (ref 0.0–0.7)
EOS PCT: 4 %
HCT: 22 % — ABNORMAL LOW (ref 39.0–52.0)
HEMOGLOBIN: 6.4 g/dL — AB (ref 13.0–17.0)
Lymphocytes Relative: 15 %
Lymphs Abs: 0.9 10*3/uL (ref 0.7–4.0)
MCH: 24.2 pg — ABNORMAL LOW (ref 26.0–34.0)
MCHC: 29.1 g/dL — AB (ref 30.0–36.0)
MCV: 83 fL (ref 78.0–100.0)
Monocytes Absolute: 0.7 10*3/uL (ref 0.1–1.0)
Monocytes Relative: 12 %
NEUTROS PCT: 68 %
Neutro Abs: 3.8 10*3/uL (ref 1.7–7.7)
PLATELETS: 210 10*3/uL (ref 150–400)
RBC: 2.65 MIL/uL — AB (ref 4.22–5.81)
RDW: 16.8 % — ABNORMAL HIGH (ref 11.5–15.5)
WBC: 5.6 10*3/uL (ref 4.0–10.5)

## 2016-04-12 LAB — FOLATE: FOLATE: 55.2 ng/mL (ref >5.9)

## 2016-04-12 LAB — IRON AND TIBC
Iron: 19 ug/dL — ABNORMAL LOW (ref 45–182)
SATURATION RATIOS: 5 % — AB (ref 17.9–39.5)
TIBC: 402 ug/dL (ref 250–450)
UIBC: 383 ug/dL

## 2016-04-12 LAB — RETICULOCYTES
RBC.: 2.65 MIL/uL — AB (ref 4.22–5.81)
RETIC CT PCT: 1.6 % (ref 0.4–3.1)
Retic Count, Absolute: 42.4 10*3/uL (ref 19.0–186.0)

## 2016-04-12 LAB — VITAMIN B12: VITAMIN B 12: 267 pg/mL (ref 180–914)

## 2016-04-12 LAB — COMPREHENSIVE METABOLIC PANEL
ALBUMIN: 3.6 g/dL (ref 3.5–5.0)
ALK PHOS: 69 U/L (ref 38–126)
ALT: 16 U/L — ABNORMAL LOW (ref 17–63)
ANION GAP: 9 (ref 5–15)
AST: 24 U/L (ref 15–41)
BUN: 23 mg/dL — ABNORMAL HIGH (ref 6–20)
CHLORIDE: 106 mmol/L (ref 101–111)
CO2: 25 mmol/L (ref 22–32)
Calcium: 8.9 mg/dL (ref 8.9–10.3)
Creatinine, Ser: 1.78 mg/dL — ABNORMAL HIGH (ref 0.61–1.24)
GFR calc Af Amer: 39 mL/min — ABNORMAL LOW (ref >60)
GFR calc non Af Amer: 34 mL/min — ABNORMAL LOW (ref >60)
GLUCOSE: 118 mg/dL — AB (ref 65–99)
Potassium: 4.3 mmol/L (ref 3.5–5.1)
SODIUM: 140 mmol/L (ref 135–145)
Total Bilirubin: 0.4 mg/dL (ref 0.3–1.2)
Total Protein: 6.5 g/dL (ref 6.5–8.1)

## 2016-04-12 LAB — FERRITIN: Ferritin: 5 ng/mL — ABNORMAL LOW (ref 24–336)

## 2016-04-12 LAB — PREPARE RBC (CROSSMATCH)

## 2016-04-12 MED ORDER — ACETAMINOPHEN 650 MG RE SUPP: 650.0000 mg | Freq: Four times a day (QID) | RECTAL | Status: DC | PRN

## 2016-04-12 MED ORDER — DOCUSATE SODIUM 100 MG PO CAPS
100.0000 mg | ORAL_CAPSULE | Freq: Every day | ORAL | Status: DC
  Administered 2016-04-13 – 2016-04-14 (×2): 100 mg via ORAL
  Filled 2016-04-12 (×2): qty 1

## 2016-04-12 MED ORDER — MECLIZINE HCL 25 MG PO TABS
25.0000 mg | ORAL_TABLET | Freq: Three times a day (TID) | ORAL | Status: DC | PRN
  Filled 2016-04-12: qty 1

## 2016-04-12 MED ORDER — ONDANSETRON HCL 4 MG PO TABS: 4.0000 mg | ORAL_TABLET | Freq: Four times a day (QID) | ORAL | Status: DC | PRN

## 2016-04-12 MED ORDER — INSULIN ASPART 100 UNIT/ML ~~LOC~~ SOLN: 0.0000 [IU] | Freq: Every day | SUBCUTANEOUS | Status: DC

## 2016-04-12 MED ORDER — TEMAZEPAM 15 MG PO CAPS
15.0000 mg | ORAL_CAPSULE | Freq: Every day | ORAL | Status: DC
  Administered 2016-04-12 – 2016-04-13 (×2): 15 mg via ORAL
  Filled 2016-04-12 (×2): qty 1

## 2016-04-12 MED ORDER — SODIUM CHLORIDE 0.9 % IV SOLN: Freq: Once | INTRAVENOUS | Status: DC

## 2016-04-12 MED ORDER — FLUTICASONE PROPIONATE 50 MCG/ACT NA SUSP
2.0000 | Freq: Every day | NASAL | Status: DC
  Filled 2016-04-12: qty 16

## 2016-04-12 MED ORDER — BISACODYL 5 MG PO TBEC
5.0000 mg | DELAYED_RELEASE_TABLET | Freq: Every day | ORAL | Status: DC
  Administered 2016-04-12 – 2016-04-14 (×3): 5 mg via ORAL
  Filled 2016-04-12 (×3): qty 1

## 2016-04-12 MED ORDER — INSULIN ASPART 100 UNIT/ML ~~LOC~~ SOLN
0.0000 [IU] | Freq: Three times a day (TID) | SUBCUTANEOUS | Status: DC
  Administered 2016-04-12 – 2016-04-13 (×2): 1 [IU] via SUBCUTANEOUS

## 2016-04-12 MED ORDER — BUPROPION HCL ER (XL) 150 MG PO TB24
150.0000 mg | ORAL_TABLET | Freq: Every day | ORAL | Status: DC
  Administered 2016-04-13 – 2016-04-14 (×2): 150 mg via ORAL
  Filled 2016-04-12 (×2): qty 1

## 2016-04-12 MED ORDER — SODIUM CHLORIDE 0.9 % IV SOLN
INTRAVENOUS | Status: DC
  Administered 2016-04-12 – 2016-04-14 (×2): via INTRAVENOUS

## 2016-04-12 MED ORDER — ACETAMINOPHEN 325 MG PO TABS
650.0000 mg | ORAL_TABLET | Freq: Four times a day (QID) | ORAL | Status: DC | PRN
Start: 2016-04-12 — End: 2016-04-14

## 2016-04-12 MED ORDER — ONDANSETRON HCL 4 MG/2ML IJ SOLN: 4.0000 mg | Freq: Four times a day (QID) | INTRAMUSCULAR | Status: DC | PRN

## 2016-04-12 MED ORDER — CITALOPRAM HYDROBROMIDE 20 MG PO TABS
20.0000 mg | ORAL_TABLET | Freq: Every day | ORAL | Status: DC
  Administered 2016-04-13 – 2016-04-14 (×2): 20 mg via ORAL
  Filled 2016-04-12 (×2): qty 1

## 2016-04-12 MED ORDER — PANTOPRAZOLE SODIUM 40 MG PO TBEC
40.0000 mg | DELAYED_RELEASE_TABLET | Freq: Every day | ORAL | Status: DC
  Administered 2016-04-13 – 2016-04-14 (×2): 40 mg via ORAL
  Filled 2016-04-12 (×2): qty 1

## 2016-04-12 NOTE — Patient Outreach (Signed)
[place] [facility]) Care Management  04/12/2016  DELOYD HANDY [birth year] [id number]   CSW made a second attempt to try and contact patient today to perform phone assessment, as well as assess and assist with social needs and services, without success.  A HIPAA complaint message was left for patient on voicemail.  CSW is currently awaiting a return call.  CSW will make a third and final attempt to outreach to patient in one week, if CSW does not receive a return call from patient in the meantime. Nat Christen, BSW, MSW, LCSW  Licensed Education officer, environmental Health System  Mailing [place] [address], [place], [place] [address] Physical Address-[address], [place], [place] [address] Toll Free Main # [phone] Fax # [phone] Cell # [phone]  Office # [phone] [email or web address]

## 2016-04-12 NOTE — Progress Notes (Signed)
Arrived to 6n21 at this time, Dr. Barbaraann Faster notified, denies nausea/pain at this time

## 2016-04-12 NOTE — Telephone Encounter (Signed)
Phoned patient. He confirms he has been admitted to [facility]. Explained Dr. Tammi Klippel has decided to push his radiation start date to Monday, March 19 at 1230. Patient committed to contacting this RN if that date will not work but, as of now plans to be present.

## 2016-04-12 NOTE — Telephone Encounter (Signed)
Pt called stating Dr Amedeo Plenty of [facility] is sending him to [facility] for blood transfusion and he does not know how long he will be in [facility]. He mentioned internal bleeding.  He is supposed to be starting XRT tomorrow.

## 2016-04-12 NOTE — Progress Notes (Signed)
Dr. Marily Memos notified of critical hgb of 6.4

## 2016-04-12 NOTE — Telephone Encounter (Signed)
Thank you. I will pass this along. Sam

## 2016-04-12 NOTE — H&P (Signed)
History and Physical    Joel [name] ZOX:[id number] DOB: October 16, 1932 DOA: 04/12/2016   PCP: Wenda Low, MD   Patient coming from/Resides with:   Admission status: Inpatient/Floor-medically necessary to stay a minimum 2 midnights to rule out impending and/or unexpected changes in physiologic status that may differ from initial evaluation performed in the ER and/or at time of admission. Patient was sent from GI office secondary to continued symptomatic anemia despite having been off anticoagulation greater than 30 days. Plan is to admit to hospital, obtain baseline labs, transfuse if necessary and subsequently undergo endoscopic procedure in a.m. on 3/13.  Chief Complaint: Symptomatic anemia  HPI: Joel [name] is a 83 y.o. male with medical history significant for atrial fibrillation currently not on anticoagulation, diabetes, pulmonary hypertension in the setting of COPD/sleep apnea and obesity hypoventilation syndrome, aortic stenosis, dyslipidemia and bipolar disorder. Since November 2017 patient has been admitted for recurrent GI bleeding symptoms (melena) with associated symptomatic anemia. The anemia has been treated with oral as well as IV iron and transfusions. His undergone multiple endoscopic procedures with the most recent being in February 2018 with the capsule endoscopy revealing a likely small bowel/jejunal source of bleeding but subsequent enteroscopy could not find source of bleeding. During the February admission the decision was made to stop Coumadin and subsequently the patient has followed up with his cardiologist and is no longer taking full dose anticoagulation but is on a full dose aspirin. He also has a diagnosis of prostate cancer which he describes as "aggressive" and has received a one-time dose of Lupron at his urologist's office. Patient's primary complaint is of generalized fatigue.   Review of Systems:  In addition to the HPI above,  No Fever-chills, myalgias or  other constitutional symptoms No Headache, changes with Vision or hearing, new weakness, tingling, numbness in any extremity, dizziness, dysarthria or word finding difficulty, gait disturbance or imbalance, tremors or seizure activity No problems swallowing food or Liquids, indigestion/reflux, choking or coughing while eating, abdominal pain with or after eating No Chest pain, Cough or Shortness of Breath, palpitations, orthopnea or DOE No Abdominal pain, N/V, melena,hematochezia, dark tarry stools, constipation No dysuria, malodorous urine, hematuria or flank pain No new skin rashes, lesions, masses or bruises, No new joint pains, aches, swelling or redness No recent unintentional weight gain or loss No polyuria, polydypsia or polyphagia   Past Medical History:  Diagnosis Date  . Allergic rhinitis   . Anemia   . Anxiety   . Aortic stenosis 2012   mild to mod   . Atrial fibrillation ([place]) 1991   with multiple DCCV  . Bipolar affective disorder ([place])   . COPD (chronic obstructive pulmonary disease) ([place])   . Diabetic neuropathy ([place])    in feet  . Diabetic neuropathy ([place])   . Dyslipidemia   . Fatigue    last year or so  . Gout   . Hypertension   . Insomnia   . Obesity   . On home oxygen therapy 2 1/2 liters with bipap at night  . OSA (obstructive sleep apnea)    severe, uses bipap 16 1/2 by 12 or 13 setting  . Prostate cancer ([place])   . Rectal bleeding 12/16/2015  . Type 2 diabetes mellitus ([place])   . Vertigo     Past Surgical History:  Procedure Laterality Date  . CARDIOVERSION  yrs ago   prior to 1998  . COLONOSCOPY WITH PROPOFOL N/A 01/23/2013   Procedure: COLONOSCOPY WITH PROPOFOL;  Surgeon: Garlan Fair, MD;  Location: [facility];  Service: Endoscopy;  Laterality: N/A;  . electro shock  1969   for depression  . ESOPHAGOGASTRODUODENOSCOPY N/A 03/03/2016   Procedure: ESOPHAGOGASTRODUODENOSCOPY (EGD);  Surgeon: Teena Irani, MD;  Location: [facility];  Service:  Endoscopy;  Laterality: N/A;  . ESOPHAGOGASTRODUODENOSCOPY N/A 03/26/2016   Procedure: ESOPHAGOGASTRODUODENOSCOPY (EGD);  Surgeon: Teena Irani, MD;  Location: [facility] ENDOSCOPY;  Service: Endoscopy;  Laterality: N/A;  would like to use ultraslim scope  . ESOPHAGOGASTRODUODENOSCOPY (EGD) WITH PROPOFOL N/A 01/23/2013   Procedure: ESOPHAGOGASTRODUODENOSCOPY (EGD) WITH PROPOFOL;  Surgeon: Garlan Fair, MD;  Location: [facility] ENDOSCOPY;  Service: Endoscopy;  Laterality: N/A;  . GIVENS CAPSULE STUDY N/A 03/03/2016   Procedure: GIVENS CAPSULE STUDY;  Surgeon: Teena Irani, MD;  Location: [facility] ENDOSCOPY;  Service: Endoscopy;  Laterality: N/A;  . HEMORRHOID SURGERY N/A 12/16/2015   Procedure: PROCTOSCOPY WITH CONTROL OF BLEEDING;  Surgeon: Fanny Skates, MD;  Location: [facility];  Service: General;  Laterality: N/A;  . KNEE SURGERY  1970's   rt  . MAZE  1998  . PROSTATE BIOPSY    . SHOULDER SURGERY  7-8 yrs ago   rt    Social History   Social History  . Marital status: Widowed    Spouse name: N/A  . Number of children: N/A  . Years of education: N/A   Occupational History  . Surveyor    Social History Main Topics  . Smoking status: Former Smoker    Packs/day: 2.00    Years: 25.00    Types: Cigarettes    Quit date: 07/08/1976  . Smokeless tobacco: Never Used  . Alcohol use No     Comment: in AA since 1977, no alcohol since 1977  . Drug use: No     Comment: marijuana use 37 years ago  . Sexual activity: Yes   Other Topics Concern  . Not on file   Social History Narrative  . No narrative on file    Mobility: [place] Work history: Not obtained   Allergies  Allergen Reactions  . Penicillins Anaphylaxis    Has patient had a PCN reaction causing immediate rash, facial/tongue/throat swelling, SOB or lightheadedness with hypotension: unknown Has patient had a PCN reaction causing severe rash involving mucus membranes or skin necrosis: unknown Has patient had a PCN reaction that required  hospitalization: unknown Has patient had a PCN reaction occurring within the last 10 years: no If all of the above answers are "NO", then may proceed with Cephalosporin use.   Marland Kitchen Antihistamines, Loratadine-Type     depression  . Other     Beta blockers-Novacaine depression    Family History  Problem Relation Age of Onset  . Tuberculosis Maternal Grandmother   . Heart attack Neg Hx   . Diabetes Neg Hx   . CAD Neg Hx   . Cancer Neg Hx      Prior to Admission medications   Medication Sig Start Date End Date Taking? Authorizing Provider  allopurinol (ZYLOPRIM) 100 MG tablet Take 100 mg by mouth daily.     Historical Provider, MD  aspirin EC 325 MG tablet Take 1 tablet (325 mg total) by mouth daily. 03/04/16   Charlynne Cousins, MD  buPROPion (WELLBUTRIN XL) 150 MG 24 hr tablet Take 1 tablet (150 mg total) by mouth daily. 12/18/15   Ambrose Finland, MD  CARTIA XT 120 MG 24 hr capsule Take 120 mg by mouth daily. 12/18/15   Historical Provider, MD  citalopram (CELEXA) 40 MG tablet Take 20 mg by mouth daily.    Historical Provider, MD  dextromethorphan-guaiFENesin (MUCINEX DM) 30-600 MG 12hr tablet Take 1 tablet by mouth 2 (two) times daily.    Historical Provider, MD  fluticasone (FLONASE) 50 MCG/ACT nasal spray Place 2 sprays into both nostrils daily. 01/27/16   Waynetta Pean, PA-C  furosemide (LASIX) 80 MG tablet Take 80 mg by mouth daily.  10/22/15   Historical Provider, MD  iron polysaccharides (NIFEREX) 150 MG capsule Take 1 capsule (150 mg total) by mouth 2 (two) times daily. Patient taking differently: Take 150 mg by mouth daily.  10/23/14   Eugenie Filler, MD  JANUVIA 100 MG tablet Take 1 tablet by mouth daily. 06/15/15   Historical Provider, MD  lovastatin (MEVACOR) 20 MG tablet Take 20 mg by mouth daily at 6 PM.     Historical Provider, MD  meclizine (ANTIVERT) 25 MG tablet Take 1 tablet (25 mg total) by mouth 3 (three) times daily as needed for dizziness. 01/28/16    Eber Jones, MD  metFORMIN (GLUCOPHAGE) 1000 MG tablet Take 1,000 mg by mouth 2 (two) times daily.  05/17/14   Historical Provider, MD  OVER THE COUNTER MEDICATION Take 1 tablet by mouth 2 (two) times daily. OTC Vision supplement    Historical Provider, MD  potassium chloride SA (K-DUR,KLOR-CON) 20 MEQ tablet Take 1 tablet by mouth daily. 09/10/14   Historical Provider, MD  temazepam (RESTORIL) 15 MG capsule Take 15 mg by mouth at bedtime.  02/04/16   Historical Provider, MD  valsartan (DIOVAN) 160 MG tablet Take 160 mg by mouth every morning.     Historical Provider, MD    Physical Exam: Vitals:   04/12/16 1506  BP: 118/87  Pulse: 86  Resp: 19  Temp: 97.5 F (36.4 C)  TempSrc: Oral  SpO2: 99%  Weight: 99.8 kg (220 lb 0.3 oz)  Height: 6' (1.829 m)      Constitutional: NAD, calm, comfortable-Appears very pale; quite talkative Eyes: PERRL, lids and sclera are normal, conjunctivae pale ENMT: Mucous membranes are moist. Posterior pharynx clear of any exudate or lesions.Normal dentition.  Neck: normal, supple, no masses, no thyromegaly Respiratory: clear to auscultation bilaterally, no wheezing, no crackles. Normal respiratory effort. No accessory muscle use.  Cardiovascular: Regular rate and rhythm, no murmurs / rubs / gallops. No extremity edema. 2+ pedal pulses. No carotid bruits.  Abdomen: no tenderness, no masses palpated. No hepatosplenomegaly. Bowel sounds positive.  Musculoskeletal: no clubbing / cyanosis. No joint deformity upper and lower extremities. Good ROM, no contractures. Normal muscle tone.  Skin: no rashes, lesions, ulcers. No induration Neurologic: CN 2-12 grossly intact. Sensation intact, DTR normal. Strength 5/5 x all 4 extremities.  Psychiatric: Normal judgment and insight. Alert and oriented x 3. Normal mood.    Labs on Admission: I have personally reviewed following labs and imaging studies  CBC: No results for input(s): WBC, NEUTROABS, HGB, HCT, MCV,  PLT in the last 168 hours. Basic Metabolic Panel: No results for input(s): NA, K, CL, CO2, GLUCOSE, BUN, CREATININE, CALCIUM, MG, PHOS in the last 168 hours. GFR: CrCl cannot be calculated (Patient's most recent lab result is older than the maximum 21 days allowed.). Liver Function Tests: No results for input(s): AST, ALT, ALKPHOS, BILITOT, PROT, ALBUMIN in the last 168 hours. No results for input(s): LIPASE, AMYLASE in the last 168 hours. No results for input(s): AMMONIA in the last 168 hours. Coagulation Profile: No results for input(s):  INR, PROTIME in the last 168 hours. Cardiac Enzymes: No results for input(s): CKTOTAL, CKMB, CKMBINDEX, TROPONINI in the last 168 hours. BNP (last 3 results) No results for input(s): PROBNP in the last 8760 hours. HbA1C: No results for input(s): HGBA1C in the last 72 hours. CBG: No results for input(s): GLUCAP in the last 168 hours. Lipid Profile: No results for input(s): CHOL, HDL, LDLCALC, TRIG, CHOLHDL, LDLDIRECT in the last 72 hours. Thyroid Function Tests: No results for input(s): TSH, T4TOTAL, FREET4, T3FREE, THYROIDAB in the last 72 hours. Anemia Panel: No results for input(s): VITAMINB12, FOLATE, FERRITIN, TIBC, IRON, RETICCTPCT in the last 72 hours. Urine analysis:    Component Value Date/Time   COLORURINE YELLOW 01/27/2016 1730   APPEARANCEUR CLOUDY (A) 01/27/2016 1730   LABSPEC 1.019 01/27/2016 1730   PHURINE 5.0 01/27/2016 1730   GLUCOSEU NEGATIVE 01/27/2016 1730   HGBUR NEGATIVE 01/27/2016 1730   BILIRUBINUR NEGATIVE 01/27/2016 1730   KETONESUR 5 (A) 01/27/2016 1730   PROTEINUR NEGATIVE 01/27/2016 1730   UROBILINOGEN 0.2 10/22/2014 2136   NITRITE NEGATIVE 01/27/2016 1730   LEUKOCYTESUR NEGATIVE 01/27/2016 1730   Sepsis Labs: @LABRCNTIP (procalcitonin:4,lacticidven:4) )No results found for this or any previous visit (from the past 240 hour(s)).   Radiological Exams on Admission: No results  found.    Assessment/Plan Principal Problem:   Chronic GI bleeding -Patient has been hospitalized "7 times" since November 2017 secondary to issues related to melena/GI bleeding -Capsule endoscopy in February revealed likely jejunal source but was unable to be located by enteroscopy -Due to recurrent anemia and concerns over occult bleeding plans are to pursue endoscopy tomorrow -Clear liquid diet then NPO after MN -Hold oral iron prior to endoscopy -Aspirin on hold  Active Problems:   Anemia due to chronic blood loss -Has required transfusions in the past -Chek CBC here; recommend transfuse for hemoglobin </= 8 -TSH -Anemia panel (history of low iron November 2017)    DM2 (diabetes mellitus, type 2)  -History of poor control in the past -Hold home metformin and Januvia in favor of SSI -HgbA1c    Moderate aortic stenosis -Patient reports generalized weakness although no syncopal spells or recurrent dizzy spells (prior vertigo secondary to Lupron) -Watch closely in setting of symptomatic anemia    Paroxysmal atrial fibrillation  -Has not been on anticoagulation since early February 2018 when Coumadin was stopped -On 03/30/16 patient did follow-up with cardiology and a long discussion was held about risks and benefits of anticoagulation with NOAC but patient declined understanding stroke risk and opted for full dose aspirin instead -Currently patient was suboptimal blood pressure readings in the setting of symptomatic anemia so we'll hold calcium channel blocker    Hypertension -Suboptimal blood pressure in setting of anemia so holding calcium channel blocker and ARB -Obtain baseline electrolyte panel    ?? Mild-to-moderate LVH -Echocardiogram 2017 revealed mild to moderate LVH but unable to valuate for LV diastolic function. Does have moderate aortic stenosis but preserved LV function -Holding preadmission Lasix    OSA (obstructive sleep apnea) -Nocturnal CPAP    COPD  mixed type/Pulmonary hypertension -Currently asymptomatic    Prostate cancer -Has received 1 dose of Lupron at urology office with doses separated by 6 month intervals -Was due to begin radiation therapy on 3/13 but because of hospitalization and symptomatic anemia this has been changed to the 19th at 12:30 PM    Bipolar I disorder  -Continue preadmission Celexa and Wellbutrin -Restoril at HS    HLD (hyperlipidemia) -Resume Mevacor  once diet advanced past clear      DVT prophylaxis: SCDs  Code Status: Full  Family Communication: No friends or family at bedside  Disposition Plan: Home Consults called: Gastroenterology/[place]    Samella Parr ANP-BC [facility] Pager [phone]   If 7PM-7AM, please contact night-coverage [email or web address] Password [facility]  04/12/2016, 4:31 PM

## 2016-04-13 ENCOUNTER — Inpatient Hospital Stay (HOSPITAL_COMMUNITY): Payer: PPO | Admitting: Anesthesiology

## 2016-04-13 ENCOUNTER — Ambulatory Visit: Payer: PPO | Admitting: Radiation Oncology

## 2016-04-13 ENCOUNTER — Encounter (HOSPITAL_COMMUNITY)
Admission: AD | Disposition: A | Discharge status: Home / Self Care | Payer: Self-pay | Source: Ambulatory Visit | Attending: Internal Medicine

## 2016-04-13 ENCOUNTER — Encounter (HOSPITAL_COMMUNITY): Payer: Self-pay

## 2016-04-13 DIAGNOSIS — I10 Essential (primary) hypertension: Secondary | ICD-10-CM

## 2016-04-13 DIAGNOSIS — J449 Chronic obstructive pulmonary disease, unspecified: Secondary | ICD-10-CM

## 2016-04-13 HISTORY — PX: ESOPHAGOGASTRODUODENOSCOPY (EGD) WITH PROPOFOL: SHX5813

## 2016-04-13 LAB — BASIC METABOLIC PANEL
ANION GAP: 7 (ref 5–15)
BUN: 22 mg/dL — ABNORMAL HIGH (ref 6–20)
CALCIUM: 8.9 mg/dL (ref 8.9–10.3)
CO2: 28 mmol/L (ref 22–32)
CREATININE: 1.48 mg/dL — AB (ref 0.61–1.24)
Chloride: 105 mmol/L (ref 101–111)
GFR calc Af Amer: 49 mL/min — ABNORMAL LOW (ref >60)
GFR, EST NON AFRICAN AMERICAN: 43 mL/min — AB (ref >60)
GLUCOSE: 102 mg/dL — AB (ref 65–99)
Potassium: 4.2 mmol/L (ref 3.5–5.1)
Sodium: 140 mmol/L (ref 135–145)

## 2016-04-13 LAB — CBC
HCT: 25.8 % — ABNORMAL LOW (ref 39.0–52.0)
Hemoglobin: 7.8 g/dL — ABNORMAL LOW (ref 13.0–17.0)
MCH: 24.8 pg — ABNORMAL LOW (ref 26.0–34.0)
MCHC: 30.2 g/dL (ref 30.0–36.0)
MCV: 81.9 fL (ref 78.0–100.0)
PLATELETS: 186 10*3/uL (ref 150–400)
RBC: 3.15 MIL/uL — ABNORMAL LOW (ref 4.22–5.81)
RDW: 16 % — AB (ref 11.5–15.5)
WBC: 4.3 10*3/uL (ref 4.0–10.5)

## 2016-04-13 LAB — HEMOGLOBIN A1C
Hgb A1c MFr Bld: 5.6 % (ref 4.8–5.6)
MEAN PLASMA GLUCOSE: 114 mg/dL

## 2016-04-13 LAB — GLUCOSE, CAPILLARY
GLUCOSE-CAPILLARY: 102 mg/dL — AB (ref 65–99)
GLUCOSE-CAPILLARY: 89 mg/dL (ref 65–99)
GLUCOSE-CAPILLARY: 130 mg/dL — AB (ref 65–99)
GLUCOSE-CAPILLARY: 148 mg/dL — AB (ref 65–99)

## 2016-04-13 SURGERY — ESOPHAGOGASTRODUODENOSCOPY (EGD) WITH PROPOFOL
Anesthesia: Monitor Anesthesia Care

## 2016-04-13 MED ORDER — VITAMIN B-12 1000 MCG PO TABS
1000.0000 ug | ORAL_TABLET | Freq: Every day | ORAL | Status: DC
  Administered 2016-04-13 – 2016-04-14 (×2): 1000 ug via ORAL
  Filled 2016-04-13 (×2): qty 1

## 2016-04-13 MED ORDER — LACTATED RINGERS IV SOLN
INTRAVENOUS | Status: DC | PRN
  Administered 2016-04-13: 11:00:00 via INTRAVENOUS

## 2016-04-13 MED ORDER — PROPOFOL 500 MG/50ML IV EMUL
INTRAVENOUS | Status: DC | PRN
  Administered 2016-04-13: 100 ug/kg/min via INTRAVENOUS

## 2016-04-13 MED ORDER — SODIUM CHLORIDE 0.9 % IV SOLN
510.0000 mg | Freq: Once | INTRAVENOUS | Status: AC
  Administered 2016-04-13: 510 mg via INTRAVENOUS
  Filled 2016-04-13: qty 17

## 2016-04-13 MED ORDER — PHENYLEPHRINE HCL 10 MG/ML IJ SOLN
INTRAVENOUS | Status: DC | PRN
  Administered 2016-04-13: 15 ug/min via INTRAVENOUS

## 2016-04-13 SURGICAL SUPPLY — 15 items

## 2016-04-13 NOTE — Brief Op Note (Signed)
04/12/2016 - 04/13/2016  12:31 PM  PATIENT:  Joel [name]  83 y.o. male  PRE-OPERATIVE DIAGNOSIS:  GI bleed  POST-OPERATIVE DIAGNOSIS:  gastric AVM - APC'd  PROCEDURE:  Procedure(s): ESOPHAGOGASTRODUODENOSCOPY (EGD) WITH PROPOFOL (N/A) with APC  SURGEON:  Surgeon(s) and Role:    * Otis Brace, MD - Primary   Recommendations ------------------------- - Patient was found to have a small AVM in gastric body - S/P treatment with APC  - Not able to find any AVM in the duodenum or proximal jejunum. - Recommend observation overnight to monitor hemoglobin. - Full liquid diet - GI will follow

## 2016-04-13 NOTE — Anesthesia Preprocedure Evaluation (Signed)
Anesthesia Evaluation  Patient identified by MRN, date of birth, ID band Patient awake    Reviewed: Allergy & Precautions, NPO status   Airway Mallampati: II  TM Distance: >3 FB     Dental   Pulmonary sleep apnea , COPD, former smoker,    breath sounds clear to auscultation       Cardiovascular hypertension,  Rhythm:Regular Rate:Normal     Neuro/Psych    GI/Hepatic negative GI ROS, Neg liver ROS,   Endo/Other  diabetes  Renal/GU negative Renal ROS     Musculoskeletal   Abdominal   Peds  Hematology   Anesthesia Other Findings   Reproductive/Obstetrics                             Anesthesia Physical Anesthesia Plan  ASA: III  Anesthesia Plan: MAC   Post-op Pain Management:    Induction: Intravenous  Airway Management Planned: Mask and Simple Face Mask  Additional Equipment:   Intra-op Plan:   Post-operative Plan: Extubation in OR  Informed Consent: I have reviewed the patients History and Physical, chart, labs and discussed the procedure including the risks, benefits and alternatives for the proposed anesthesia with the patient or authorized representative who has indicated his/her understanding and acceptance.   Dental advisory given  Plan Discussed with: CRNA and Anesthesiologist  Anesthesia Plan Comments:         Anesthesia Quick Evaluation

## 2016-04-13 NOTE — Transfer of Care (Signed)
Immediate Anesthesia Transfer of Care Note  Patient: Joel [name]  Procedure(s) Performed: Procedure(s): ESOPHAGOGASTRODUODENOSCOPY (EGD) WITH PROPOFOL (N/A)  Patient Location: Endoscopy Unit  Anesthesia Type:MAC  Level of Consciousness: awake, alert  and oriented  Airway & Oxygen Therapy: Patient Spontanous Breathing and Patient connected to nasal cannula oxygen  Post-op Assessment: Report given to RN, Post -op Vital signs reviewed and stable and Patient moving all extremities X 4  Post vital signs: Reviewed and stable  Last Vitals:  Vitals:   04/13/16 1100 04/13/16 1227  BP: 129/75 (!) 104/36  Pulse: 69 67  Resp: 20 10  Temp: 36.5 C 36.7 C    Last Pain:  Vitals:   04/13/16 1227  TempSrc: Oral         Complications: No apparent anesthesia complications

## 2016-04-13 NOTE — Anesthesia Postprocedure Evaluation (Signed)
Anesthesia Post Note  Patient: Joel [name]  Procedure(s) Performed: Procedure(s) (LRB): ESOPHAGOGASTRODUODENOSCOPY (EGD) WITH PROPOFOL (N/A)  Patient location during evaluation: PACU Anesthesia Type: MAC Pain management: pain level controlled Vital Signs Assessment: post-procedure vital signs reviewed and stable Respiratory status: spontaneous breathing Cardiovascular status: stable Anesthetic complications: no       Last Vitals:  Vitals:   04/13/16 1240 04/13/16 1332  BP: 115/62 (!) 142/84  Pulse: 65 70  Resp: 16 16  Temp:  37.1 C    Last Pain:  Vitals:   04/13/16 1332  TempSrc: Oral                 Zeppelin Beckstrand

## 2016-04-13 NOTE — Progress Notes (Signed)
[facility] PROGRESS NOTE  Joel [name] ERD:[id number] DOB: 1932/12/08 DOA: 04/12/2016  PCP: Wenda Low, MD  Brief History/Interval Summary: 83 y.o. male with medical history significant for atrial fibrillation currently not on anticoagulation, diabetes, pulmonary hypertension in the setting of COPD/sleep apnea and obesity hypoventilation syndrome, aortic stenosis, dyslipidemia and bipolar disorder. Since November 2017 patient has been admitted for recurrent GI bleeding symptoms (melena) with associated symptomatic anemia. Once again, detected to have severe anemia. Sent over for endoscopic workup.  Reason for Visit: Severe anemia. Melanotic stool.  Consultants: Gastroenterology  Procedures: Plan is for upper endoscopy today.  Antibiotics: None  Subjective/Interval History: Patient feels well. Has noticed black stools, although he takes iron tablets. Has noticed that the stools have been more black than usual. Denies any abdominal pain, nausea or vomiting.  ROS: Denies any chest pain or shortness of breath  Objective:  Vital Signs  Vitals:   04/13/16 0253 04/13/16 0311 04/13/16 0612 04/13/16 1100  BP: 119/68 124/74 125/79 129/75  Pulse: 66 72 61 69  Resp: 16 18 17 20   Temp: 97.9 F (36.6 C) 98.2 F (36.8 C) 98.1 F (36.7 C) 97.7 F (36.5 C)  TempSrc: Axillary Axillary Axillary Oral  SpO2: 100% 100% 100% 96%  Weight:      Height:        Intake/Output Summary (Last 24 hours) at 04/13/16 1222 Last data filed at 04/13/16 1220  Gross per 24 hour  Intake             1686 ml  Output                0 ml  Net             1686 ml   Filed Weights   04/12/16 1506  Weight: 99.8 kg (220 lb 0.3 oz)    General appearance: alert, cooperative, appears stated age and no distress Resp: clear to auscultation bilaterally Cardio: S1, S2 is normal, regular. No S3, S4. Systolic murmur appreciated over the precordium. No significant pedal edema. GI: soft, non-tender;  bowel sounds normal; no masses,  no organomegaly Extremities: extremities normal, atraumatic, no cyanosis or edema Neurologic: No focal deficits.  Lab Results:  Data Reviewed: I have personally reviewed following labs and imaging studies  CBC:  Recent Labs Lab 04/12/16 1710 04/13/16 0818  WBC 5.6 4.3  NEUTROABS 3.8  --   HGB 6.4* 7.8*  HCT 22.0* 25.8*  MCV 83.0 81.9  PLT 210 563    Basic Metabolic Panel:  Recent Labs Lab 04/12/16 1710 04/13/16 0818  NA 140 140  K 4.3 4.2  CL 106 105  CO2 25 28  GLUCOSE 118* 102*  BUN 23* 22*  CREATININE 1.78* 1.48*  CALCIUM 8.9 8.9    GFR: Estimated Creatinine Clearance: 47.9 mL/min (by C-G formula based on SCr of 1.48 mg/dL (H)).  Liver Function Tests:  Recent Labs Lab 04/12/16 1710  AST 24  ALT 16*  ALKPHOS 69  BILITOT 0.4  PROT 6.5  ALBUMIN 3.6    HbA1C:  Recent Labs  04/12/16 1710  HGBA1C 5.6    CBG:  Recent Labs Lab 04/12/16 1656 04/12/16 2037 04/13/16 0805  GLUCAP 147* 79 102*     Thyroid Function Tests:  Recent Labs  04/12/16 1710  TSH 4.539*    Anemia Panel:  Recent Labs  04/12/16 1710  VITAMINB12 267  FOLATE 55.2  FERRITIN 5*  TIBC 402  IRON 19*  RETICCTPCT 1.6  Radiology Studies: No results found.   Medications:  Scheduled: . [MAR Hold] sodium chloride   Intravenous Once  . [MAR Hold] bisacodyl  5 mg Oral Daily  . [MAR Hold] buPROPion  150 mg Oral Daily  . [MAR Hold] citalopram  20 mg Oral Daily  . [MAR Hold] docusate sodium  100 mg Oral Daily  . [MAR Hold] fluticasone  2 spray Each Nare Daily  . [MAR Hold] insulin aspart  0-5 Units Subcutaneous QHS  . [MAR Hold] insulin aspart  0-9 Units Subcutaneous TID WC  . [MAR Hold] pantoprazole  40 mg Oral Daily  . [MAR Hold] temazepam  15 mg Oral QHS   Continuous: . sodium chloride 75 mL/hr at 04/12/16 1635   PRN:[MAR Hold] acetaminophen **OR** [MAR Hold] acetaminophen, [MAR Hold] meclizine, [MAR Hold] ondansetron  **OR** [MAR Hold] ondansetron (ZOFRAN) IV  Assessment/Plan:  Principal Problem:   Chronic GI bleeding Active Problems:   DM2 (diabetes mellitus, type 2) (HCC)   OSA (obstructive sleep apnea)   COPD mixed type (HCC)   Moderate aortic stenosis   Pulmonary hypertension   Rectal bleeding   Paroxysmal atrial fibrillation (HCC)   Bipolar I disorder (HCC)   Hypertension   HLD (hyperlipidemia)   Anemia due to chronic blood loss   Diabetes mellitus with complication (HCC)    Chronic GI bleeding -Patient has been hospitalized "7 times" since November 2017 secondary to issues related to melena/GI bleeding -Capsule endoscopy in February revealed likely jejunal source but was unable to be located by enteroscopy -Due to recurrent anemia and concerns over occult bleeding plans are to pursue endoscopy today -Hold oral iron prior to endoscopy -Aspirin on hold  Anemia due to chronic blood loss -Has required transfusions in the past -Hemoglobin was 6.4. Patient was transfused 2 units of blood. Hemoglobin 7.8 today. Will be repeated tomorrow.  -TSH is 4.5 -Anemia panel revealed ferritin of 5. Iron 19. TIBC 402. Vitamin B-12 level 267. Will benefit from intravenous iron. And will also initiate vitamin B-12.  DM2 (diabetes mellitus, type 2)  -History of poor control in the past -Hold home metformin and Januvia in favor of SSI -HgbA1c is 5.6  Moderate aortic stenosis -Patient reports generalized weakness although no syncopal spells or recurrent dizzy spells (prior vertigo secondary to Lupron) -Watch closely in setting of symptomatic anemia  Paroxysmal atrial fibrillation  -Has not been on anticoagulation since early February 2018 when Coumadin was stopped -On 03/30/16 patient did follow-up with cardiology and a long discussion was held about risks and benefits of anticoagulation with NOAC but patient declined understanding stroke risk and opted for full dose aspirin instead -Currently  patient was suboptimal blood pressure readings in the setting of symptomatic anemia so holding calcium channel blocker  Essential Hypertension Monitor blood pressures closely. Seems to be reasonably well controlled. Holding his oral agents since it was borderline low yesterday.  Mild-to-moderate LVH -Echocardiogram 2017 revealed mild to moderate LVH but unable to valuate for LV diastolic function. Does have moderate aortic stenosis but preserved LV function -Holding preadmission Lasix  OSA (obstructive sleep apnea) Nocturnal CPAP  COPD mixed type/Pulmonary hypertension -Currently asymptomatic  Prostate cancer -Has received 1 dose of Lupron at urology office with doses separated by 6 month intervals -Was due to begin radiation therapy on 3/13 but because of hospitalization and symptomatic anemia this has been changed to the 19th at 12:30 PM  Bipolar I disorder  -Continue preadmission Celexa and Wellbutrin -Restoril at HS  HLD (hyperlipidemia) -Resume  Mevacor once diet advanced past clear  DVT Prophylaxis: SCDs    Code Status: Full code  Family Communication: Discussed with patient. No family at bedside  Disposition Plan: Management as outlined above. Await endoscopy.    LOS: 1 day   [facility] Pager [phone] 04/13/2016, 12:22 PM  If 7PM-7AM, please contact night-coverage at [email or web address], password [facility]

## 2016-04-13 NOTE — Consult Note (Signed)
Referring Provider:  Dr. Amedeo Plenty Primary Care Physician:  Wenda Low, MD Primary Gastroenterologist:  Dr. Amedeo Plenty  Reason for Consultation:  Dark stool, symptomatically anemia  HPI: Joel [name] is a 83 y.o. male with past medical history significant for obscure GI bleed with a negative workup recently was seen in the clinic by Dr. Amedeo Plenty. Patient was complaining of ongoing black stool and weakness and was admitted to the hospital for further evaluation and blood transfusion.  Patient was complaining of on and off black stool. Last bowel movement Saturday. Denied abdominal pain, nausea or vomiting. Denied fresh blood in the stool. Patient was complaining of fatigue and weakness.   Capsule endoscopy - 03/03/2016 - scattered blood in the proximal small bowel with a small nonbleeding AVM. Follow-up enteroscopy negative.  Past Medical History:  Diagnosis Date  . Allergic rhinitis   . Anemia   . Anxiety   . Aortic stenosis 2012   mild to mod   . Atrial fibrillation ([place]) 1991   with multiple DCCV  . Bipolar affective disorder ([place])   . COPD (chronic obstructive pulmonary disease) ([place])   . Diabetic neuropathy ([place])    in feet  . Diabetic neuropathy ([place])   . Dyslipidemia   . Fatigue    last year or so  . Gout   . Hypertension   . Insomnia   . Obesity   . On home oxygen therapy 2 1/2 liters with bipap at night  . OSA (obstructive sleep apnea)    severe, uses bipap 16 1/2 by 12 or 13 setting  . Prostate cancer ([place])   . Rectal bleeding 12/16/2015  . Type 2 diabetes mellitus ([place])   . Vertigo     Past Surgical History:  Procedure Laterality Date  . CARDIOVERSION  yrs ago   prior to 1998  . COLONOSCOPY WITH PROPOFOL N/A 01/23/2013   Procedure: COLONOSCOPY WITH PROPOFOL;  Surgeon: Garlan Fair, MD;  Location: [facility] ENDOSCOPY;  Service: Endoscopy;  Laterality: N/A;  . electro shock  1969   for depression  . ESOPHAGOGASTRODUODENOSCOPY N/A 03/03/2016   Procedure:  ESOPHAGOGASTRODUODENOSCOPY (EGD);  Surgeon: Teena Irani, MD;  Location: [facility];  Service: Endoscopy;  Laterality: N/A;  . ESOPHAGOGASTRODUODENOSCOPY N/A 03/26/2016   Procedure: ESOPHAGOGASTRODUODENOSCOPY (EGD);  Surgeon: Teena Irani, MD;  Location: [facility] ENDOSCOPY;  Service: Endoscopy;  Laterality: N/A;  would like to use ultraslim scope  . ESOPHAGOGASTRODUODENOSCOPY (EGD) WITH PROPOFOL N/A 01/23/2013   Procedure: ESOPHAGOGASTRODUODENOSCOPY (EGD) WITH PROPOFOL;  Surgeon: Garlan Fair, MD;  Location: [facility] ENDOSCOPY;  Service: Endoscopy;  Laterality: N/A;  . GIVENS CAPSULE STUDY N/A 03/03/2016   Procedure: GIVENS CAPSULE STUDY;  Surgeon: Teena Irani, MD;  Location: [facility] ENDOSCOPY;  Service: Endoscopy;  Laterality: N/A;  . HEMORRHOID SURGERY N/A 12/16/2015   Procedure: PROCTOSCOPY WITH CONTROL OF BLEEDING;  Surgeon: Fanny Skates, MD;  Location: [facility];  Service: General;  Laterality: N/A;  . KNEE SURGERY  1970's   rt  . MAZE  1998  . PROSTATE BIOPSY    . SHOULDER SURGERY  7-8 yrs ago   rt    Prior to Admission medications   Medication Sig Start Date End Date Taking? Authorizing Provider  allopurinol (ZYLOPRIM) 100 MG tablet Take 100 mg by mouth daily.    Yes Historical Provider, MD  aspirin EC 325 MG tablet Take 1 tablet (325 mg total) by mouth daily. 03/04/16  Yes Charlynne Cousins, MD  Bisacodyl (LAXATIVE PO) Take 1 tablet by mouth at bedtime.  Yes Historical Provider, MD  buPROPion (WELLBUTRIN XL) 150 MG 24 hr tablet Take 1 tablet (150 mg total) by mouth daily. 12/18/15  Yes Ambrose Finland, MD  citalopram (CELEXA) 40 MG tablet Take 40 mg by mouth daily.    Yes Historical Provider, MD  dextromethorphan-guaiFENesin (MUCINEX DM) 30-600 MG 12hr tablet Take 1 tablet by mouth 2 (two) times daily.   Yes Historical Provider, MD  diclofenac sodium (VOLTAREN) 1 % GEL Apply 1 application topically daily as needed (knee pain).   Yes Historical Provider, MD  diltiazem (CARTIA XT) 120 MG 24 hr  capsule Take 120 mg by mouth at bedtime.   Yes Historical Provider, MD  Docusate Calcium (STOOL SOFTENER PO) Take 1 capsule by mouth at bedtime.   Yes Historical Provider, MD  fluticasone (FLONASE) 50 MCG/ACT nasal spray Place 2 sprays into both nostrils daily. Patient taking differently: Place 2 sprays into both nostrils daily as needed for allergies or rhinitis.  01/27/16  Yes Waynetta Pean, PA-C  furosemide (LASIX) 80 MG tablet Take 80 mg by mouth daily.  10/22/15  Yes Historical Provider, MD  iron polysaccharides (NIFEREX) 150 MG capsule Take 1 capsule (150 mg total) by mouth 2 (two) times daily. Patient taking differently: Take 150 mg by mouth daily.  10/23/14  Yes Eugenie Filler, MD  lovastatin (MEVACOR) 20 MG tablet Take 20 mg by mouth at bedtime.    Yes Historical Provider, MD  meclizine (ANTIVERT) 25 MG tablet Take 1 tablet (25 mg total) by mouth 3 (three) times daily as needed for dizziness. Patient taking differently: Take 25 mg by mouth 2 (two) times daily.  01/28/16  Yes Eber Jones, MD  metFORMIN (GLUCOPHAGE) 1000 MG tablet Take 1,000 mg by mouth 2 (two) times daily.  05/17/14  Yes Historical Provider, MD  Multiple Vitamin (MULTIVITAMIN WITH MINERALS) TABS tablet Take 1 tablet by mouth 2 (two) times daily.   Yes Historical Provider, MD  Multiple Vitamins-Minerals (VISION FORMULA PO) Take 1 tablet by mouth 2 (two) times daily.   Yes Historical Provider, MD  OXYGEN Inhale 2 L into the lungs as needed (shortness of breath).    Yes Historical Provider, MD  pantoprazole (PROTONIX) 40 MG tablet Take 40 mg by mouth daily. 04/01/16  Yes Historical Provider, MD  potassium chloride SA (K-DUR,KLOR-CON) 20 MEQ tablet Take 20 mEq by mouth daily.  09/10/14  Yes Historical Provider, MD  [place] into the lungs at bedtime. BIPAP - high 16, low 12   Yes Historical Provider, MD  sitaGLIPtin (JANUVIA) 100 MG tablet Take 100 mg by mouth daily.   Yes Historical Provider, MD   temazepam (RESTORIL) 15 MG capsule Take 15 mg by mouth at bedtime.  02/04/16  Yes Historical Provider, MD  valsartan (DIOVAN) 160 MG tablet Take 160 mg by mouth daily.    Yes Historical Provider, MD    Scheduled Meds: . sodium chloride   Intravenous Once  . bisacodyl  5 mg Oral Daily  . buPROPion  150 mg Oral Daily  . citalopram  20 mg Oral Daily  . docusate sodium  100 mg Oral Daily  . fluticasone  2 spray Each Nare Daily  . insulin aspart  0-5 Units Subcutaneous QHS  . insulin aspart  0-9 Units Subcutaneous TID WC  . pantoprazole  40 mg Oral Daily  . temazepam  15 mg Oral QHS   Continuous Infusions: . sodium chloride 75 mL/hr at 04/12/16 1635   PRN Meds:.acetaminophen **OR** acetaminophen, meclizine,  ondansetron **OR** ondansetron (ZOFRAN) IV  Allergies as of 04/12/2016 - Review Complete 04/12/2016  Allergen Reaction Noted  . Penicillins Anaphylaxis 12/16/2015  . Antihistamines, loratadine-type Other (See Comments) 01/18/2013  . Other  01/18/2013    Family History  Problem Relation Age of Onset  . Tuberculosis Maternal Grandmother   . Heart attack Neg Hx   . Diabetes Neg Hx   . CAD Neg Hx   . Cancer Neg Hx     Social History   Social History  . Marital status: Widowed    Spouse name: N/A  . Number of children: N/A  . Years of education: N/A   Occupational History  . Surveyor    Social History Main Topics  . Smoking status: Former Smoker    Packs/day: 2.00    Years: 25.00    Types: Cigarettes    Quit date: 07/08/1976  . Smokeless tobacco: Never Used  . Alcohol use No     Comment: in AA since 1977, no alcohol since 1977  . Drug use: No     Comment: marijuana use 37 years ago  . Sexual activity: Yes   Other Topics Concern  . Not on file   Social History Narrative  . No narrative on file    Review of Systems: All negative except as stated above in HPI. Review of Systems  Constitutional: Positive for malaise/fatigue. Negative for chills and fever.   HENT: Negative for ear discharge, ear pain and nosebleeds.   Eyes: Negative for pain and discharge.  Respiratory: Negative for cough and hemoptysis.   Cardiovascular: Negative for chest pain and palpitations.  Gastrointestinal: Positive for constipation and melena. Negative for abdominal pain and vomiting.  Genitourinary: Negative for dysuria and urgency.  Musculoskeletal: Negative for back pain and neck pain.  Skin: Negative for itching and rash.  Neurological: Positive for weakness. Negative for focal weakness and seizures.  Endo/Heme/Allergies: Does not bruise/bleed easily.  Psychiatric/Behavioral: Negative for hallucinations and suicidal ideas.    Physical Exam: Vital signs: Vitals:   04/13/16 0311 04/13/16 0612  BP: 124/74 125/79  Pulse: 72 61  Resp: 18 17  Temp: 98.2 F (36.8 C) 98.1 F (36.7 C)   Last BM Date: 04/10/16 General:   Alert,  Well-developed, well-nourished, pleasant and cooperative in NAD HEENT : NS, AT , EOMI Neck : supple. Trachea midline  Sclera - un icteric  Lungs:  Clear throughout to auscultation.   No wheezes, crackles, or rhonchi. No acute distress. Heart:  Regular rate and rhythm; no murmurs, clicks, rubs,  or gallops. Abdomen: Soft, nontender, nondistended, bowel sounds present. No peritoneal signs. Lower extremity-no edema. Psych- mood and affect  Normal.A/O 3 Rectal:  Deferred  GI:  Lab Results:  Recent Labs  04/12/16 1710  WBC 5.6  HGB 6.4*  HCT 22.0*  PLT 210   BMET  Recent Labs  04/12/16 1710  NA 140  K 4.3  CL 106  CO2 25  GLUCOSE 118*  BUN 23*  CREATININE 1.78*  CALCIUM 8.9   LFT  Recent Labs  04/12/16 1710  PROT 6.5  ALBUMIN 3.6  AST 24  ALT 16*  ALKPHOS 69  BILITOT 0.4   PT/INR No results for input(s): LABPROT, INR in the last 72 hours.   Studies/Results: No results found.  Impression/Plan: Symptomatically anemia, hemoglobin dropped to 6.4 Melena. Last bowel movement Saturday Obscure GI bleed.  Capsule endoscopy showing blood in the proximal small bowel with possible AVM follow-up enteroscopy negative  Recommendations ------------------------- -  EGD with peds colonoscopy today. Risk benefits discussed with the patient. Verbalized understanding. - Monitor H&H. Transfuse as needed. - Further management based on endoscopic finding.    LOS: 1 day   Otis Brace  MD, FACP 04/13/2016, 7:59 AM  Pager [phone] If no answer or after 5 PM call [phone]

## 2016-04-13 NOTE — Anesthesia Procedure Notes (Signed)
Procedure Name: MAC Date/Time: 04/13/2016 11:52 AM Performed by: Kyung Rudd Pre-anesthesia Checklist: Patient identified, Emergency Drugs available, Suction available and Patient being monitored Patient Re-evaluated:Patient Re-evaluated prior to inductionOxygen Delivery Method: Nasal cannula Intubation Type: IV induction Placement Confirmation: positive ETCO2

## 2016-04-13 NOTE — Op Note (Signed)
[facility] Patient Name: Joel [name] Procedure Date : 04/13/2016 MRN: [id number] Attending MD: Otis Brace , MD Date of Birth: 1932/05/13 CSN: [id number] Age: 83 Admit Type: Outpatient Procedure:                Upper GI endoscopy Indications:              Melena, Suspected upper gastrointestinal bleeding                            in patient with chronic blood loss Providers:                Otis Brace, MD, Vista Lawman, RN, Rhae Lerner, CRNA, William Dalton, Technician Referring MD:              Medicines:                Sedation Administered by an Anesthesia Professional Complications:            No immediate complications. Estimated Blood Loss:     Estimated blood loss was minimal. Procedure:                Pre-Anesthesia Assessment:                           - Prior to the procedure, a History and Physical                            was performed, and patient medications and                            allergies were reviewed. The patient's tolerance of                            previous anesthesia was also reviewed. The risks                            and benefits of the procedure and the sedation                            options and risks were discussed with the patient.                            All questions were answered, and informed consent                            was obtained. Prior Anticoagulants: The patient has                            taken no previous anticoagulant or antiplatelet                            agents. ASA Grade Assessment: III - A patient with  severe systemic disease. After reviewing the risks                            and benefits, the patient was deemed in                            satisfactory condition to undergo the procedure.                           After obtaining informed consent, the endoscope was                            passed under direct vision.  Throughout the                            procedure, the patient's blood pressure, pulse, and                            oxygen saturations were monitored continuously. The                            EC-[id number] ([id number]) scope was introduced through                            the mouth, and advanced to the proximal jejunum.                            The upper GI endoscopy was accomplished without                            difficulty. The patient tolerated the procedure                            well. Scope In: Scope Out: Findings:      No gross lesions were noted in the entire esophagus.      One small no bleeding angioectasia was found in the gastric body. this       AVM started to ooze with contact. Coagulation for hemostasis using argon       plasma was successful.      The duodenal bulb, first portion of the duodenum, second portion of the       duodenum, third portion of the duodenum and fourth portion of the       duodenum were normal.      The examined proximal jejunum was normal without any evidence of       bleeding. Impression:               - No gross lesions in esophagus.                           - One non-bleeding angioectasia in the stomach.                            Treated with argon plasma coagulation (APC).                           -  Normal duodenal bulb, first portion of the                            duodenum, second portion of the duodenum, third                            portion of the duodenum and fourth portion of the                            duodenum.                           - Normal examined jejunum.                           - No specimens collected. Moderate Sedation:      Moderate (conscious) sedation was personally administered by an       anesthesia professional. The following parameters were monitored: oxygen       saturation, heart rate, blood pressure, and response to care. Recommendation:           - Return patient to hospital ward for  ongoing care.                           - Full liquid diet.                           - Continue present medications.                           - Return to GI clinic in 2 weeks. Procedure Code(s):        --- Professional ---                           [phone], Esophagogastroduodenoscopy, flexible,                            transoral; with control of bleeding, any method Diagnosis Code(s):        --- Professional ---                           U04.540, Angiodysplasia of stomach and duodenum                            without bleeding                           K92.1, Melena (includes Hematochezia)                           R58, Hemorrhage, not elsewhere classified CPT copyright 2016 [employer]. All rights reserved. The codes documented in this report are preliminary and upon coder review may  be revised to meet current compliance requirements. Otis Brace, MD Otis Brace, MD 04/13/2016 12:27:16 PM Number of Addenda: 0

## 2016-04-14 ENCOUNTER — Ambulatory Visit: Payer: PPO

## 2016-04-14 ENCOUNTER — Encounter (HOSPITAL_COMMUNITY): Payer: Self-pay | Admitting: Gastroenterology

## 2016-04-14 DIAGNOSIS — Z510 Encounter for antineoplastic radiation therapy: Secondary | ICD-10-CM | POA: Diagnosis not present

## 2016-04-14 DIAGNOSIS — C61 Malignant neoplasm of prostate: Secondary | ICD-10-CM | POA: Diagnosis not present

## 2016-04-14 LAB — BPAM RBC
BLOOD PRODUCT EXPIRATION DATE: 201804062359
Blood Product Expiration Date: 201804062359
ISSUE DATE / TIME: 201803130239
ISSUE DATE / TIME: 201803122258
UNIT TYPE AND RH: 5100
Unit Type and Rh: 5100

## 2016-04-14 LAB — TYPE AND SCREEN
ABO/RH(D): O POS
Antibody Screen: NEGATIVE
UNIT DIVISION: 0
Unit division: 0

## 2016-04-14 LAB — BASIC METABOLIC PANEL
Anion gap: 5 (ref 5–15)
BUN: 11 mg/dL (ref 6–20)
CALCIUM: 9.1 mg/dL (ref 8.9–10.3)
CO2: 25 mmol/L (ref 22–32)
CREATININE: 1.16 mg/dL (ref 0.61–1.24)
Chloride: 108 mmol/L (ref 101–111)
GFR calc non Af Amer: 57 mL/min — ABNORMAL LOW (ref >60)
GLUCOSE: 159 mg/dL — AB (ref 65–99)
Potassium: 3.8 mmol/L (ref 3.5–5.1)
Sodium: 138 mmol/L (ref 135–145)

## 2016-04-14 LAB — GLUCOSE, CAPILLARY: Glucose-Capillary: 111 mg/dL — ABNORMAL HIGH (ref 65–99)

## 2016-04-14 LAB — CBC
HEMATOCRIT: 27.5 % — AB (ref 39.0–52.0)
Hemoglobin: 8.2 g/dL — ABNORMAL LOW (ref 13.0–17.0)
MCH: 24.6 pg — AB (ref 26.0–34.0)
MCHC: 29.8 g/dL — AB (ref 30.0–36.0)
MCV: 82.6 fL (ref 78.0–100.0)
Platelets: 183 10*3/uL (ref 150–400)
RBC: 3.33 MIL/uL — ABNORMAL LOW (ref 4.22–5.81)
RDW: 16.1 % — AB (ref 11.5–15.5)
WBC: 4.7 10*3/uL (ref 4.0–10.5)

## 2016-04-14 NOTE — Progress Notes (Signed)
[facility] Progress Note  Joel [name] 83 y.o. 04/19/1932  CC:  GI bleed, symptomatic anemia   Subjective:   feeling better. Had 1 bowel movement after 3 days today which was dark/ black in color but formed according to patient . Denied abdominal pain. Tolerating diet.  ROS : Negative for abdominal pain, nausea and vomiting.   Objective: Vital signs in last 24 hours: Vitals:   04/13/16 2220 04/14/16 0425  BP: 128/60 (!) 123/51  Pulse: 68 61  Resp: 16 17  Temp: 97.8 F (36.6 C) 98.2 F (36.8 C)    Physical Exam:  General:  Alert, cooperative, no distress, appears stated age  Head:  Normocephalic, without obvious abnormality, atraumatic  Eyes:  , EOM's intact,   Lungs:   Clear to auscultation bilaterally, respirations unlabored  Heart:  Regular rate and rhythm, S1, S2 normal  Abdomen:   Soft, non-tender, bowel sounds active all four quadrants,  no masses,   Extremities: Extremities normal, atraumatic, no  edema  Pulses: 2+ and symmetric    Lab Results:  Recent Labs  04/12/16 1710 04/13/16 0818  NA 140 140  K 4.3 4.2  CL 106 105  CO2 25 28  GLUCOSE 118* 102*  BUN 23* 22*  CREATININE 1.78* 1.48*  CALCIUM 8.9 8.9    Recent Labs  04/12/16 1710  AST 24  ALT 16*  ALKPHOS 69  BILITOT 0.4  PROT 6.5  ALBUMIN 3.6    Recent Labs  04/12/16 1710 04/13/16 0818  WBC 5.6 4.3  NEUTROABS 3.8  --   HGB 6.4* 7.8*  HCT 22.0* 25.8*  MCV 83.0 81.9  PLT 210 186   No results for input(s): LABPROT, INR in the last 72 hours.    Assessment/Plan: Symptomatically anemia. Status post blood transfusion Melena.  Obscure GI bleed. Capsule endoscopy showing blood in the proximal small bowel with possible AVM follow-up enteroscopy negative in 03/2016.   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 - For some reason, blood work has not collected this morning. - Advance  diet. - Okay to discharge from GI standpoint if hemoglobin remains stable. - Follow-up with Dr. Amedeo Plenty in 2 weeks. - GI will sign off.  Call us back if needed   Otis Brace MD, [place] 04/14/2016, 9:13 AM  Pager [phone]  If no answer or after 5 PM call [phone]

## 2016-04-14 NOTE — Discharge Summary (Signed)
Physician Discharge [place] IHK:[id number] DOB: 1932/11/18 DOA: 04/12/2016  PCP: Wenda Low, MD  Admit date: 04/12/2016 Discharge date: 04/14/2016  Admitted From: Home Disposition:  home  Recommendations for Outpatient Follow-up:  1. Follow up with PCP in 2-3 weeks 2. Follow up with Dr. Amedeo Plenty in 2 weeks   Discharge Condition:Stable CODE STATUS:Full Diet recommendation: Heart healthy   Brief/Interim Summary: 83 y.o.malewith medical history significant for atrial fibrillation currently not on anticoagulation, diabetes, pulmonary hypertension in thesetting of COPD/sleep apnea and obesity hypoventilation syndrome, aortic stenosis, dyslipidemia and bipolar disorder. Since November 2017 patient has been admitted for recurrent GI bleeding symptoms (melena)with associated symptomatic anemia. Once again, detected to have severe anemia. Sent over for endoscopic workup.  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 To resume on discharge -Aspirin was briefly on hold, OK to resume on discharge per GI  Anemia due to chronic blood loss -Has required transfusions in the past -Hemoglobin was 6.4. Patient was transfused 2 units of blood. Hemoglobin 7.8 today. Hgb has improved to 8.2 on day of discharge -TSH is 4.5 -Anemia panel revealed ferritin of 5. Iron 19. TIBC 402. Vitamin B-12 level 267.  DM2 (diabetes mellitus, type 2)  -History of poor control in the past -Briefly held home metformin and Januvia in favor of SSI while admitted, to resume home meds on discharge -HgbA1c is 5.6  Moderate aortic  stenosis -Patient reports generalized weakness although no syncopal spells or recurrent dizzy spells (prior vertigo secondary to Lupron)  Paroxysmal atrial fibrillation  -Has not been on anticoagulation since early February 2018 when Coumadin was stopped -On 03/30/16 patient did follow-up with cardiology and a long discussion was held about risks and benefits of anticoagulation with NOACbut patient declined understanding stroke risk and opted for full dose aspirin instead -Currently patient was suboptimal blood pressure readings in the setting of symptomatic anemia so held calcium channel blocker  Essential Hypertension Monitor blood pressures closely. Seems to be reasonably well controlled.  Mild-to-moderate LVH -Echocardiogram 2017 revealed mild to moderate LVH but unable to valuate for LV diastolic function. Does have moderate aortic stenosis but preserved LV function -Held preadmission Lasix, to resume home meds on discharge  OSA (obstructive sleep apnea) Nocturnal CPAP  COPD mixed type/Pulmonary hypertension -Currently asymptomatic  Prostate cancer -Has received 1 dose of Lupron at urology office with doses separated by 6 month intervals -Was due to begin radiation therapy on 3/13 but because of hospitalization and symptomatic anemia this has been changed to the 19th at 12:30 PM  Bipolar I disorder  -Continue preadmission Celexa and Wellbutrin -Restoril at HS  HLD (hyperlipidemia) -Resume Mevacor once diet advanced past clear  Discharge Diagnoses:  Principal Problem:   Chronic GI bleeding Active Problems:   DM2 (diabetes mellitus, type 2) (HCC)   OSA (obstructive sleep apnea)   COPD mixed type (HCC)   Moderate aortic stenosis   Pulmonary hypertension   Rectal bleeding   Paroxysmal atrial fibrillation (HCC)   Bipolar I disorder (HCC)   Hypertension   HLD (hyperlipidemia)   Anemia due to chronic blood loss   Diabetes mellitus with complication  [facility])    Discharge Instructions  Allergies as of 04/14/2016      Reactions   Penicillins Anaphylaxis   Has patient had a PCN reaction causing immediate rash, facial/tongue/throat swelling, SOB or lightheadedness with hypotension: unknown Has patient had a PCN reaction causing severe rash involving mucus membranes or skin necrosis: unknown Has patient had a PCN reaction that required hospitalization: unknown Has patient had a PCN reaction occurring within the last 10 years: no If all of the above answers are "NO", then may proceed with Cephalosporin use.   Antihistamines, Loratadine-type Other (See Comments)   depression   Other    Beta blockers-Novacaine  Caused depression      Medication List    TAKE these medications   allopurinol 100 MG tablet Commonly known as:  ZYLOPRIM Take 100 mg by mouth daily.   aspirin EC 325 MG tablet Take 1 tablet (325 mg total) by mouth daily.   buPROPion 150 MG 24 hr tablet Commonly known as:  WELLBUTRIN XL Take 1 tablet (150 mg total) by mouth daily.   CARTIA XT 120 MG 24 hr capsule Generic drug:  diltiazem Take 120 mg by mouth at bedtime.   citalopram 40 MG tablet Commonly known as:  CELEXA Take 40 mg by mouth daily.   dextromethorphan-guaiFENesin 30-600 MG 12hr tablet Commonly known as:  MUCINEX DM Take 1 tablet by mouth 2 (two) times daily.   diclofenac sodium 1 % Gel Commonly known as:  VOLTAREN Apply 1 application topically daily as needed (knee pain).   fluticasone 50 MCG/ACT nasal spray Commonly known as:  FLONASE Place 2 sprays into both nostrils daily. What changed:  when to take this  reasons to take this   furosemide 80 MG tablet Commonly known as:  LASIX Take 80 mg by mouth daily.   iron polysaccharides 150 MG capsule Commonly known as:  NIFEREX Take 1 capsule (150 mg total) by mouth 2 (two) times daily. What changed:  when to take this   LAXATIVE PO Take 1 tablet by mouth at bedtime.   lovastatin 20  MG tablet Commonly known as:  MEVACOR Take 20 mg by mouth at bedtime.   meclizine 25 MG tablet Commonly known as:  ANTIVERT Take 1 tablet (25 mg total) by mouth 3 (three) times daily as needed for dizziness. What changed:  when to take this   metFORMIN 1000 MG tablet Commonly known as:  GLUCOPHAGE Take 1,000 mg by mouth 2 (two) times daily.   multivitamin with minerals Tabs tablet Take 1 tablet by mouth 2 (two) times daily.   OXYGEN Inhale 2 L into the lungs as needed (shortness of breath).   pantoprazole 40 MG tablet Commonly known as:  PROTONIX Take 40 mg by mouth daily.   potassium chloride SA 20 MEQ tablet Commonly known as:  K-DUR,KLOR-CON Take 20 mEq by mouth daily.   PRESCRIPTION MEDICATION Inhale into the lungs at bedtime. BIPAP - high 16, low 12   sitaGLIPtin 100 MG tablet Commonly known as:  JANUVIA Take 100 mg by mouth daily.   STOOL SOFTENER PO Take 1 capsule by mouth at bedtime.   temazepam 15 MG capsule Commonly known as:  RESTORIL Take 15 mg by mouth at bedtime.   valsartan 160 MG tablet Commonly known as:  DIOVAN Take 160 mg by mouth daily.   VISION FORMULA PO Take 1 tablet by mouth 2 (two) times daily.      Follow-up Information    HAYES,JOHN C, MD. Schedule an appointment as soon as possible for  a visit in 2 week(s).   Specialty:  Gastroenterology Contact information: [address] [place] [address] [phone]        Wenda Low, MD. Schedule an appointment as soon as possible for a visit in 3 week(s).   Specialty:  Internal Medicine Contact information: [address] [place] [address] [phone]          Allergies  Allergen Reactions  . Penicillins Anaphylaxis    Has patient had a PCN reaction causing immediate rash, facial/tongue/throat swelling, SOB or lightheadedness with hypotension: unknown Has patient had a PCN reaction causing severe rash involving mucus membranes or skin  necrosis: unknown Has patient had a PCN reaction that required hospitalization: unknown Has patient had a PCN reaction occurring within the last 10 years: no If all of the above answers are "NO", then may proceed with Cephalosporin use.   Marland Kitchen Antihistamines, Loratadine-Type Other (See Comments)    depression  . Other     Beta blockers-Novacaine  Caused depression    Consultations:  GI  Procedures/Studies: No results found.  Subjective: Eager to go home  Discharge Exam: Vitals:   04/13/16 2220 04/14/16 0425  BP: 128/60 (!) 123/51  Pulse: 68 61  Resp: 16 17  Temp: 97.8 F (36.6 C) 98.2 F (36.8 C)   Vitals:   04/13/16 1332 04/13/16 1427 04/13/16 2220 04/14/16 0425  BP: (!) 142/84 129/63 128/60 (!) 123/51  Pulse: 70 70 68 61  Resp: 16 16 16 17   Temp: 98.7 F (37.1 C) 98.1 F (36.7 C) 97.8 F (36.6 C) 98.2 F (36.8 C)  TempSrc: Oral Axillary Oral Oral  SpO2: 99% 99% 97% 98%  Weight:      Height:        General: Pt is alert, awake, not in acute distress Cardiovascular: RRR, S1/S2 +, no rubs, no gallops Respiratory: CTA bilaterally, no wheezing, no rhonchi Abdominal: Soft, NT, ND, bowel sounds + Extremities: no edema, no cyanosis   The results of significant diagnostics from this hospitalization (including imaging, microbiology, ancillary and laboratory) are listed below for reference.     Microbiology: No results found for this or any previous visit (from the past 240 hour(s)).   Labs: BNP (last 3 results)  Recent Labs  06/28/15 2227  BNP 10.2   Basic Metabolic Panel:  Recent Labs Lab 04/12/16 1710 04/13/16 0818 04/14/16 0909  NA 140 140 138  K 4.3 4.2 3.8  CL 106 105 108  CO2 25 28 25   GLUCOSE 118* 102* 159*  BUN 23* 22* 11  CREATININE 1.78* 1.48* 1.16  CALCIUM 8.9 8.9 9.1   Liver Function Tests:  Recent Labs Lab 04/12/16 1710  AST 24  ALT 16*  ALKPHOS 69  BILITOT 0.4  PROT 6.5  ALBUMIN 3.6   No results for input(s): LIPASE,  AMYLASE in the last 168 hours. No results for input(s): AMMONIA in the last 168 hours. CBC:  Recent Labs Lab 04/12/16 1710 04/13/16 0818 04/14/16 0909  WBC 5.6 4.3 4.7  NEUTROABS 3.8  --   --   HGB 6.4* 7.8* 8.2*  HCT 22.0* 25.8* 27.5*  MCV 83.0 81.9 82.6  PLT 210 186 183   Cardiac Enzymes: No results for input(s): CKTOTAL, CKMB, CKMBINDEX, TROPONINI in the last 168 hours. BNP: Invalid input(s): POCBNP CBG:  Recent Labs Lab 04/13/16 0805 04/13/16 1328 04/13/16 1717 04/13/16 2226 04/14/16 0826  GLUCAP 102* 89 130* 148* 111*   D-Dimer No results for input(s):  DDIMER in the last 72 hours. Hgb A1c  Recent Labs  04/12/16 1710  HGBA1C 5.6   Lipid Profile No results for input(s): CHOL, HDL, LDLCALC, TRIG, CHOLHDL, LDLDIRECT in the last 72 hours. Thyroid function studies  Recent Labs  04/12/16 1710  TSH 4.539*   Anemia work up  Recent Labs  04/12/16 1710  VITAMINB12 267  FOLATE 55.2  FERRITIN 5*  TIBC 402  IRON 19*  RETICCTPCT 1.6   Urinalysis    Component Value Date/Time   COLORURINE YELLOW 01/27/2016 1730   APPEARANCEUR CLOUDY (A) 01/27/2016 1730   LABSPEC 1.019 01/27/2016 1730   PHURINE 5.0 01/27/2016 1730   GLUCOSEU NEGATIVE 01/27/2016 1730   HGBUR NEGATIVE 01/27/2016 1730   BILIRUBINUR NEGATIVE 01/27/2016 1730   KETONESUR 5 (A) 01/27/2016 1730   PROTEINUR NEGATIVE 01/27/2016 1730   UROBILINOGEN 0.2 10/22/2014 2136   NITRITE NEGATIVE 01/27/2016 1730   LEUKOCYTESUR NEGATIVE 01/27/2016 1730   Sepsis Labs Invalid input(s): PROCALCITONIN,  WBC,  LACTICIDVEN Microbiology No results found for this or any previous visit (from the past 240 hour(s)).   SIGNED:   Donne Hazel, MD  [facility] 04/14/2016, 10:55 AM  If 7PM-7AM, please contact night-coverage [email or web address] Password [email or web address]

## 2016-04-14 NOTE — Consult Note (Signed)
   [facility] Inpatient Consult   04/14/2016  CHLOE BAIG 07-Apr-1932 [id number]    Came to visit Mr. Gill prior to hospital discharge. He is active with [place] Management program. Please see chart review then notes for additional patient outreach details. Spoke with Mr. Szeto at bedside who indicates he is feeling a lot better and is anxious to return home. Agreeable to ongoing [place] Management services. Will alert [place] team of his discharge today. Mr. Makela also reports his radiation for prostate cancer will begin on Monday. Will pass this information along to [facility] team. Provided contact information. Appreciative of visit. Will make inpatient RNCM aware.   Marthenia Rolling, MSN-Ed, RN,BSN [facility] Liaison [phone]

## 2016-04-14 NOTE — Progress Notes (Signed)
Pt discharged to home.  Discharge instructions explained to pt.  Pt has no questions at the time of discharge.  Pt states he has all belongings.  Pt removed IV.  Pt taken off unit via wheelchair by volunteer services.

## 2016-04-15 ENCOUNTER — Ambulatory Visit: Payer: PPO

## 2016-04-15 ENCOUNTER — Other Ambulatory Visit: Payer: Self-pay | Admitting: *Deleted

## 2016-04-15 DIAGNOSIS — E662 Morbid (severe) obesity with alveolar hypoventilation: Secondary | ICD-10-CM | POA: Diagnosis not present

## 2016-04-15 DIAGNOSIS — G4733 Obstructive sleep apnea (adult) (pediatric): Secondary | ICD-10-CM | POA: Diagnosis not present

## 2016-04-15 DIAGNOSIS — J410 Simple chronic bronchitis: Secondary | ICD-10-CM | POA: Diagnosis not present

## 2016-04-15 NOTE — Patient Outreach (Signed)
[place] [facility]) Care Management  04/15/2016  RYKER SUDBURY Feb 08, 1932 [id number]   RN attempted outreach attempt post hospital discharge however pt not available. RN able to leave a HIPAA approved voice message and will continue attempt to contact pt for transition of care. Will schedule ongoing transition of care calls accordingly.  Raina Mina, RN Care Management Coordinator [place] Office [phone]

## 2016-04-16 ENCOUNTER — Ambulatory Visit: Payer: PPO

## 2016-04-16 ENCOUNTER — Other Ambulatory Visit: Payer: Self-pay | Admitting: *Deleted

## 2016-04-16 NOTE — Patient Outreach (Signed)
[place] [facility]) Care Management  04/16/2016  Joel [name] 1932/07/20 [id number]  RN spoke with pt post-op hospitalization and inquired on his ongoing recovery and review his discharge orders. Also verified identifiers and if this was a good time to discussed his recent hospitalization. Confirm as pt reports he feels much better with his balance, vision and no longer confused. Pt understands his discharge orders and verified a sufficient supply of medications and reports he drives if needed but this is not something he does everyday. Transition of care template completed and pt reports his primary providers office verified his next office visit will be on 3/20 due to his recent hospitalization. No other inquires or request as pt continues to recovery with no acute issues. RN offered an earlier home visit however pt states he will continue his intensive therapy with treatments now that his H/H are improving and request to keep the 4/12 next home visit along with the weekly transition of care contacts in the afternoon. Will schedule accordingly and continue to follow up.  Patient was recently discharged from hospital and all medications have been reviewed.  Raina Mina, RN Care Management Coordinator [place] Office [phone]

## 2016-04-19 ENCOUNTER — Other Ambulatory Visit: Payer: Self-pay | Admitting: *Deleted

## 2016-04-19 ENCOUNTER — Encounter: Payer: Self-pay | Admitting: *Deleted

## 2016-04-19 ENCOUNTER — Ambulatory Visit
Admission: RE | Admit: 2016-04-19 | Discharge: 2016-04-19 | Disposition: A | Discharge status: Home / Self Care | Payer: PPO | Source: Ambulatory Visit | Attending: Radiation Oncology | Admitting: Radiation Oncology

## 2016-04-19 DIAGNOSIS — C61 Malignant neoplasm of prostate: Secondary | ICD-10-CM | POA: Diagnosis not present

## 2016-04-19 DIAGNOSIS — Z510 Encounter for antineoplastic radiation therapy: Secondary | ICD-10-CM | POA: Diagnosis not present

## 2016-04-19 NOTE — Patient Outreach (Signed)
[place] [facility]) Care Management  04/19/2016  CHUKWUKA FESTA 1932/03/02 [id number]   CSW made a third and final attempt to try and contact patient today to perform phone assessment, as well as assess and assist with social work needs and services, without success.  A HIPAA compliant message was left for patient on voicemail.  CSW  continues to await a return call.  CSW will mail an outreach letter to patient's home, encouraging patient to contact CSW at their earliest convenience, if patient is interested in receiving social work services through [place] with [facility].  If CSW does not receive a return call from patient within the next 10 business days, CSW will proceed with case closure.  Required number of phone attempts will have been made and outreach letter mailed.   Nat Christen, BSW, MSW, LCSW  Licensed Education officer, environmental Health System  Mailing [place] [address] [place], [place] [address] Physical Address-[address], [place], [place] [address] Toll Free Main # [phone] Fax # [phone] Cell # [phone]  Office # [phone] [email or web address]

## 2016-04-20 ENCOUNTER — Ambulatory Visit
Admission: RE | Admit: 2016-04-20 | Discharge: 2016-04-20 | Disposition: A | Discharge status: Home / Self Care | Payer: PPO | Source: Ambulatory Visit | Attending: Radiation Oncology | Admitting: Radiation Oncology

## 2016-04-20 DIAGNOSIS — C61 Malignant neoplasm of prostate: Secondary | ICD-10-CM | POA: Diagnosis not present

## 2016-04-20 DIAGNOSIS — Z510 Encounter for antineoplastic radiation therapy: Secondary | ICD-10-CM | POA: Diagnosis not present

## 2016-04-21 ENCOUNTER — Ambulatory Visit
Admission: RE | Admit: 2016-04-21 | Discharge: 2016-04-21 | Disposition: A | Discharge status: Home / Self Care | Payer: PPO | Source: Ambulatory Visit | Attending: Radiation Oncology | Admitting: Radiation Oncology

## 2016-04-21 DIAGNOSIS — C61 Malignant neoplasm of prostate: Secondary | ICD-10-CM | POA: Diagnosis not present

## 2016-04-21 DIAGNOSIS — Z510 Encounter for antineoplastic radiation therapy: Secondary | ICD-10-CM | POA: Diagnosis not present

## 2016-04-22 ENCOUNTER — Ambulatory Visit
Admission: RE | Admit: 2016-04-22 | Discharge: 2016-04-22 | Disposition: A | Discharge status: Home / Self Care | Payer: PPO | Source: Ambulatory Visit | Attending: Radiation Oncology | Admitting: Radiation Oncology

## 2016-04-22 DIAGNOSIS — C61 Malignant neoplasm of prostate: Secondary | ICD-10-CM | POA: Diagnosis not present

## 2016-04-22 DIAGNOSIS — Z510 Encounter for antineoplastic radiation therapy: Secondary | ICD-10-CM | POA: Diagnosis not present

## 2016-04-22 DIAGNOSIS — D649 Anemia, unspecified: Secondary | ICD-10-CM | POA: Diagnosis not present

## 2016-04-22 DIAGNOSIS — Q2733 Arteriovenous malformation of digestive system vessel: Secondary | ICD-10-CM | POA: Diagnosis not present

## 2016-04-22 DIAGNOSIS — K922 Gastrointestinal hemorrhage, unspecified: Secondary | ICD-10-CM | POA: Diagnosis not present

## 2016-04-23 ENCOUNTER — Other Ambulatory Visit: Payer: Self-pay | Admitting: *Deleted

## 2016-04-23 ENCOUNTER — Ambulatory Visit
Admission: RE | Admit: 2016-04-23 | Discharge: 2016-04-23 | Disposition: A | Discharge status: Home / Self Care | Payer: PPO | Source: Ambulatory Visit | Attending: Radiation Oncology | Admitting: Radiation Oncology

## 2016-04-23 DIAGNOSIS — Z510 Encounter for antineoplastic radiation therapy: Secondary | ICD-10-CM | POA: Diagnosis not present

## 2016-04-23 DIAGNOSIS — C61 Malignant neoplasm of prostate: Secondary | ICD-10-CM | POA: Diagnosis not present

## 2016-04-23 NOTE — Patient Outreach (Signed)
[facility]) Care Management  04/23/2016  Joel [name] 1932-10-08 [id number]   RN completed a transition of care call today as pt indicates he is doing well with no reported issues. Pt getting stronger and has started back receiving his radiation therapy. Denies any falls or injuries and pt no additional symptoms of low hemoglobin levels as indicated in the past.  Pt has indicated he recently had labs drawn and was suppose to received a call from Dr. Glenna Durand nurse yesterday but received no calls. RN offered to follow up and assist as pt delighted but indicated Dr. Glenna Durand office closes early on Friday.  RN will contact Dr. Glenna Durand office and make the request for nurse to follow up with pt today.  RN called Dr. Glenna Durand office and was forwarded to Dr. Glenna Durand nurse Elmyra Ricks. RN left a detail confidential voice mail with the pt's request to be called today with his INR results. RN also left a contact number for this RN if further assistance is needed.   RN returned a call to pt and updated on the voice message left to Dr. Glenna Durand nurse Elmyra Ricks and to expect a call today prior to office closing as nurse indicated on her voice mail she would be leaving at 1330 pm today. Pt very appreciative and grateful for the assistance. No further request or inquires at this time. RN will follow up with ongoing weekly transition of care next week and a home visit early April as planned. Note pt continues to have a tight scheduled due to he ongoing radiation therapy scheduled weekly.  Pt aware to contact his RN case manager with any issues or concerns.  Raina Mina, RN Care Management Coordinator [place] Office [phone]

## 2016-04-26 ENCOUNTER — Ambulatory Visit
Admission: RE | Admit: 2016-04-26 | Discharge: 2016-04-26 | Disposition: A | Discharge status: Home / Self Care | Payer: PPO | Source: Ambulatory Visit | Attending: Radiation Oncology | Admitting: Radiation Oncology

## 2016-04-26 DIAGNOSIS — Z510 Encounter for antineoplastic radiation therapy: Secondary | ICD-10-CM | POA: Diagnosis not present

## 2016-04-26 DIAGNOSIS — C61 Malignant neoplasm of prostate: Secondary | ICD-10-CM | POA: Diagnosis not present

## 2016-04-27 ENCOUNTER — Ambulatory Visit
Admission: RE | Admit: 2016-04-27 | Discharge: 2016-04-27 | Disposition: A | Discharge status: Home / Self Care | Payer: PPO | Source: Ambulatory Visit | Attending: Radiation Oncology | Admitting: Radiation Oncology

## 2016-04-27 DIAGNOSIS — C61 Malignant neoplasm of prostate: Secondary | ICD-10-CM | POA: Diagnosis not present

## 2016-04-27 DIAGNOSIS — Z510 Encounter for antineoplastic radiation therapy: Secondary | ICD-10-CM | POA: Diagnosis not present

## 2016-04-28 ENCOUNTER — Ambulatory Visit
Admission: RE | Admit: 2016-04-28 | Discharge: 2016-04-28 | Disposition: A | Discharge status: Home / Self Care | Payer: PPO | Source: Ambulatory Visit | Attending: Radiation Oncology | Admitting: Radiation Oncology

## 2016-04-28 DIAGNOSIS — Z510 Encounter for antineoplastic radiation therapy: Secondary | ICD-10-CM | POA: Diagnosis not present

## 2016-04-28 DIAGNOSIS — C61 Malignant neoplasm of prostate: Secondary | ICD-10-CM | POA: Diagnosis not present

## 2016-04-29 ENCOUNTER — Ambulatory Visit
Admission: RE | Admit: 2016-04-29 | Discharge: 2016-04-29 | Disposition: A | Discharge status: Home / Self Care | Payer: PPO | Source: Ambulatory Visit | Attending: Radiation Oncology | Admitting: Radiation Oncology

## 2016-04-29 DIAGNOSIS — Z510 Encounter for antineoplastic radiation therapy: Secondary | ICD-10-CM | POA: Diagnosis not present

## 2016-04-29 DIAGNOSIS — C61 Malignant neoplasm of prostate: Secondary | ICD-10-CM | POA: Diagnosis not present

## 2016-04-30 ENCOUNTER — Ambulatory Visit
Admission: RE | Admit: 2016-04-30 | Discharge: 2016-04-30 | Disposition: A | Discharge status: Home / Self Care | Payer: PPO | Source: Ambulatory Visit | Attending: Radiation Oncology | Admitting: Radiation Oncology

## 2016-04-30 DIAGNOSIS — Z510 Encounter for antineoplastic radiation therapy: Secondary | ICD-10-CM | POA: Diagnosis not present

## 2016-04-30 DIAGNOSIS — C61 Malignant neoplasm of prostate: Secondary | ICD-10-CM | POA: Diagnosis not present

## 2016-05-03 ENCOUNTER — Ambulatory Visit
Admission: RE | Admit: 2016-05-03 | Discharge: 2016-05-03 | Disposition: A | Discharge status: Home / Self Care | Payer: PPO | Source: Ambulatory Visit | Attending: Radiation Oncology | Admitting: Radiation Oncology

## 2016-05-03 ENCOUNTER — Encounter: Payer: Self-pay | Admitting: Medical Oncology

## 2016-05-03 ENCOUNTER — Other Ambulatory Visit: Payer: Self-pay | Admitting: *Deleted

## 2016-05-03 DIAGNOSIS — Z510 Encounter for antineoplastic radiation therapy: Secondary | ICD-10-CM | POA: Diagnosis not present

## 2016-05-03 DIAGNOSIS — C61 Malignant neoplasm of prostate: Secondary | ICD-10-CM | POA: Diagnosis not present

## 2016-05-03 NOTE — Patient Outreach (Signed)
[place] [facility]) Care Management  05/03/2016  Joel [name] 1933/01/12 [id number]   CSW will perform a case closure on patient, due to inability to establish initial phone contact with patient, despite required number of phone attempts made and outreach letter mailed to patient's home.  CSW will notify patient's RNCM with [place] Management, Raina Mina of CSW's plans to close patient's case.  CSW will fax an update to patient's Primary Care Physician, [name] to ensure that they are aware of CSW's involvement with patient's plan of care.  CSW will submit a case closure request to Verlon Setting, Care Management Assistant with [place] Management, in the form of an [employer].  CSW will ensure that Mrs. Comer is aware of Imelda Pillow, RNCM with [place] Management, continued involvement with patient's care. Nat Christen, BSW, MSW, LCSW  Licensed Education officer, environmental Health System  Mailing [place] [address] [place] [address] [place], [place] [address] Physical Address-[address], [place], [place] [address] Toll Free Main # [phone] Fax # [phone] Cell # [phone]  Office # [phone] [email or web address]

## 2016-05-03 NOTE — Progress Notes (Signed)
Joel [name] states he is doing well. He states that he has had some diarrhea and fatigue. We discussed using imodium prn for diarrhea and getting extra rest for the fatigue. He fells well today because he had the weekend to rest from the radiation. Will continue to follow.

## 2016-05-04 ENCOUNTER — Ambulatory Visit
Admission: RE | Admit: 2016-05-04 | Discharge: 2016-05-04 | Disposition: A | Discharge status: Home / Self Care | Payer: PPO | Source: Ambulatory Visit | Attending: Radiation Oncology | Admitting: Radiation Oncology

## 2016-05-04 DIAGNOSIS — Z510 Encounter for antineoplastic radiation therapy: Secondary | ICD-10-CM | POA: Diagnosis not present

## 2016-05-04 DIAGNOSIS — C61 Malignant neoplasm of prostate: Secondary | ICD-10-CM | POA: Diagnosis not present

## 2016-05-05 ENCOUNTER — Ambulatory Visit
Admission: RE | Admit: 2016-05-05 | Discharge: 2016-05-05 | Disposition: A | Discharge status: Home / Self Care | Payer: PPO | Source: Ambulatory Visit | Attending: Radiation Oncology | Admitting: Radiation Oncology

## 2016-05-05 DIAGNOSIS — Z510 Encounter for antineoplastic radiation therapy: Secondary | ICD-10-CM | POA: Diagnosis not present

## 2016-05-05 DIAGNOSIS — C61 Malignant neoplasm of prostate: Secondary | ICD-10-CM | POA: Diagnosis not present

## 2016-05-06 ENCOUNTER — Ambulatory Visit
Admission: RE | Admit: 2016-05-06 | Discharge: 2016-05-06 | Disposition: A | Discharge status: Home / Self Care | Payer: PPO | Source: Ambulatory Visit | Attending: Radiation Oncology | Admitting: Radiation Oncology

## 2016-05-06 DIAGNOSIS — C61 Malignant neoplasm of prostate: Secondary | ICD-10-CM | POA: Diagnosis not present

## 2016-05-06 DIAGNOSIS — Z510 Encounter for antineoplastic radiation therapy: Secondary | ICD-10-CM | POA: Diagnosis not present

## 2016-05-07 ENCOUNTER — Ambulatory Visit
Admission: RE | Admit: 2016-05-07 | Discharge: 2016-05-07 | Disposition: A | Discharge status: Home / Self Care | Payer: PPO | Source: Ambulatory Visit | Attending: Radiation Oncology | Admitting: Radiation Oncology

## 2016-05-07 ENCOUNTER — Encounter: Payer: Self-pay | Admitting: Radiation Oncology

## 2016-05-07 ENCOUNTER — Other Ambulatory Visit: Payer: Self-pay | Admitting: *Deleted

## 2016-05-07 VITALS — BP 129/71 | HR 73 | Temp 97.7°F | Resp 18 | Ht <= 58 in | Wt 230.6 lb

## 2016-05-07 DIAGNOSIS — Z510 Encounter for antineoplastic radiation therapy: Secondary | ICD-10-CM | POA: Diagnosis not present

## 2016-05-07 DIAGNOSIS — C61 Malignant neoplasm of prostate: Secondary | ICD-10-CM | POA: Diagnosis not present

## 2016-05-07 NOTE — Progress Notes (Signed)
Weight and vital signs are stable.  Denies urinary frequency,urgency and hematuria. Nocturia 0 times during the night. Having a bowel movement daily stools are soft.  Reports mild fatigue post radiation treatments that goes away in a couple hours.  Appetite is good. Wt Readings from Last 3 Encounters:  05/07/16 230 lb 9.6 oz (104.6 kg)  04/12/16 220 lb 0.3 oz (99.8 kg)  04/08/16 220 lb (99.8 kg)  BP 129/71   Pulse 73   Temp 97.7 F (36.5 C) (Oral)   Resp 18   Ht (!) 6" (0.152 m)   Wt 230 lb 9.6 oz (104.6 kg)   SpO2 100%   BMI 4503.60 kg/m

## 2016-05-07 NOTE — Patient Outreach (Signed)
[place] [facility]) Care Management  05/07/2016  Joel [name] 11/16/1932 [id number]   RN completed a transition of care call today as pt reports he is doing well with no additional issues. States no bleed and his H/H is holding with no weakness or falls reported. Pt states he continues to be busy with his ongoing radiation therapy. Pt states "I feel good". RN review plan of care related to [facility] and goals as pt continues to work toward improvement and safety measures in and outside the home to prevent falls. RN reminded pt of the upcoming home visit scheduled for this month and will follow up accordingly. Pt very appreciative and states he looks forward to the home visit. Will continue to follow up accordingly related to ongoing transition of care contacts and community case management services.   Raina Mina, RN Care Management Coordinator [place] Office [phone]

## 2016-05-07 NOTE — Progress Notes (Signed)
  Radiation Oncology         [phone] ________________________________  Name: Joel [name] MRN: [id number]  Date: 05/07/2016  DOB: 17-Feb-1932     Weekly Radiation Therapy Management    ICD-9-CM ICD-10-CM   1. Malignant neoplasm of prostate ([place]) 185 C61      Current Dose: 27 Gy     Planned Dose:  75 Gy  Narrative . . . . . . . . The patient presents for routine under treatment assessment.                                 Weight and vitals stable. Denies urinary frequency, urgency, and hematuria. Nocturia zero times during the night. Having a bowel movements daily, stools are soft. Reports mild fatigue post radiation treatments that goes away in a couple hours. Appetite is good. States he is on medication for A fib.                                  Set-up films were reviewed.                                 The chart was checked. Physical Findings. . .  height is 6" (0.152 m) (abnormal) and weight is 230 lb 9.6 oz (104.6 kg). His oral temperature is 97.7 F (36.5 C). His blood pressure is 129/71 and his pulse is 73. His respiration is 18 and oxygen saturation is 100%. . Weight essentially stable.  No significant changes. Lungs are clear to auscultation bilaterally. Heart has regular rate and rhythm. Abdomen soft, non-tender, normal bowel sounds. Impression . . . . . . . The patient is tolerating radiation. Plan . . . . . . . . . . . . Continue treatment as planned.  ________________________________   Blair Promise, PhD, MD  This document serves as a record of services personally performed by Gery Pray, MD. It was created on his behalf by Arlyce Harman, a trained medical scribe. The creation of this record is based on the scribe's personal observations and the provider's statements to them. This document has been checked and approved by the attending provider.

## 2016-05-10 ENCOUNTER — Ambulatory Visit
Admission: RE | Admit: 2016-05-10 | Discharge: 2016-05-10 | Disposition: A | Discharge status: Home / Self Care | Payer: PPO | Source: Ambulatory Visit | Attending: Radiation Oncology | Admitting: Radiation Oncology

## 2016-05-10 DIAGNOSIS — C61 Malignant neoplasm of prostate: Secondary | ICD-10-CM | POA: Diagnosis not present

## 2016-05-10 DIAGNOSIS — Z510 Encounter for antineoplastic radiation therapy: Secondary | ICD-10-CM | POA: Diagnosis not present

## 2016-05-11 ENCOUNTER — Ambulatory Visit
Admission: RE | Admit: 2016-05-11 | Discharge: 2016-05-11 | Disposition: A | Discharge status: Home / Self Care | Payer: PPO | Source: Ambulatory Visit | Attending: Radiation Oncology | Admitting: Radiation Oncology

## 2016-05-11 DIAGNOSIS — C61 Malignant neoplasm of prostate: Secondary | ICD-10-CM | POA: Diagnosis not present

## 2016-05-11 DIAGNOSIS — Z510 Encounter for antineoplastic radiation therapy: Secondary | ICD-10-CM | POA: Diagnosis not present

## 2016-05-12 ENCOUNTER — Ambulatory Visit
Admission: RE | Admit: 2016-05-12 | Discharge: 2016-05-12 | Disposition: A | Discharge status: Home / Self Care | Payer: PPO | Source: Ambulatory Visit | Attending: Radiation Oncology | Admitting: Radiation Oncology

## 2016-05-12 DIAGNOSIS — C61 Malignant neoplasm of prostate: Secondary | ICD-10-CM | POA: Diagnosis not present

## 2016-05-12 DIAGNOSIS — Z510 Encounter for antineoplastic radiation therapy: Secondary | ICD-10-CM | POA: Diagnosis not present

## 2016-05-13 ENCOUNTER — Ambulatory Visit
Admission: RE | Admit: 2016-05-13 | Discharge: 2016-05-13 | Disposition: A | Discharge status: Home / Self Care | Payer: PPO | Source: Ambulatory Visit | Attending: Radiation Oncology | Admitting: Radiation Oncology

## 2016-05-13 ENCOUNTER — Other Ambulatory Visit: Payer: Self-pay | Admitting: *Deleted

## 2016-05-13 DIAGNOSIS — D509 Iron deficiency anemia, unspecified: Secondary | ICD-10-CM | POA: Diagnosis not present

## 2016-05-13 DIAGNOSIS — Q2733 Arteriovenous malformation of digestive system vessel: Secondary | ICD-10-CM | POA: Diagnosis not present

## 2016-05-13 DIAGNOSIS — Z510 Encounter for antineoplastic radiation therapy: Secondary | ICD-10-CM | POA: Diagnosis not present

## 2016-05-13 DIAGNOSIS — C61 Malignant neoplasm of prostate: Secondary | ICD-10-CM | POA: Diagnosis not present

## 2016-05-13 DIAGNOSIS — Z7901 Long term (current) use of anticoagulants: Secondary | ICD-10-CM | POA: Diagnosis not present

## 2016-05-13 DIAGNOSIS — K625 Hemorrhage of anus and rectum: Secondary | ICD-10-CM | POA: Diagnosis not present

## 2016-05-13 NOTE — Patient Outreach (Addendum)
[facility]) Care Management  05/13/2016  RICKARDO BRINEGAR Jun 16, 1932 [id number]  Home visit/transition of care  RN arrived at pt's home today and pt was not available. RN contacted pt while at the residence and pt answered the phone and indicated he was called just yesterday for an appointment to check his H/H and has other pending appointments. Pt apologized for the inconvenience and indicated he was doing very well however continues with his daily radiation and several appointments in between. Pt requested to rescheduled another home visit for next month in May when perhaps his scheduled it not so tight. Pt states he continues to take all his medications as recommended and attends all his appointments as he is able to drive himself with no problems. Pt states he has more energy and able to manage his care very well so far with side effects, falls or precipitating symptoms have occurred. Pt again expressed his apology for the missed home visit today. RN has informed pt that he will received an ongoing telephone transition of care call next week and RN will schedule a home visit for next month as requested around his scheduled appointments. No other issues or request at this time as pt continue to recovery well. Plan of care verbally reviewed as pt continue to manage his care with no major issues or delays.   Raina Mina, RN Care Management Coordinator [place] Office [phone]

## 2016-05-14 ENCOUNTER — Inpatient Hospital Stay (HOSPITAL_COMMUNITY)
Admission: EM | Admit: 2016-05-14 | Discharge: 2016-05-18 | DRG: 378 | Disposition: A | Discharge status: Home Health | Payer: PPO | Attending: Internal Medicine | Admitting: Internal Medicine

## 2016-05-14 ENCOUNTER — Encounter (HOSPITAL_COMMUNITY): Payer: Self-pay

## 2016-05-14 ENCOUNTER — Ambulatory Visit
Admission: RE | Admit: 2016-05-14 | Discharge: 2016-05-14 | Disposition: A | Discharge status: Home / Self Care | Payer: PPO | Source: Ambulatory Visit | Attending: Radiation Oncology | Admitting: Radiation Oncology

## 2016-05-14 DIAGNOSIS — G4700 Insomnia, unspecified: Secondary | ICD-10-CM | POA: Diagnosis not present

## 2016-05-14 DIAGNOSIS — F419 Anxiety disorder, unspecified: Secondary | ICD-10-CM | POA: Diagnosis present

## 2016-05-14 DIAGNOSIS — K449 Diaphragmatic hernia without obstruction or gangrene: Secondary | ICD-10-CM | POA: Diagnosis present

## 2016-05-14 DIAGNOSIS — Z79899 Other long term (current) drug therapy: Secondary | ICD-10-CM

## 2016-05-14 DIAGNOSIS — E119 Type 2 diabetes mellitus without complications: Secondary | ICD-10-CM | POA: Diagnosis not present

## 2016-05-14 DIAGNOSIS — I350 Nonrheumatic aortic (valve) stenosis: Secondary | ICD-10-CM | POA: Diagnosis present

## 2016-05-14 DIAGNOSIS — Z9981 Dependence on supplemental oxygen: Secondary | ICD-10-CM

## 2016-05-14 DIAGNOSIS — J9610 Chronic respiratory failure, unspecified whether with hypoxia or hypercapnia: Secondary | ICD-10-CM | POA: Diagnosis present

## 2016-05-14 DIAGNOSIS — I482 Chronic atrial fibrillation: Secondary | ICD-10-CM | POA: Diagnosis present

## 2016-05-14 DIAGNOSIS — Z6832 Body mass index (BMI) 32.0-32.9, adult: Secondary | ICD-10-CM

## 2016-05-14 DIAGNOSIS — K921 Melena: Principal | ICD-10-CM | POA: Diagnosis present

## 2016-05-14 DIAGNOSIS — Z510 Encounter for antineoplastic radiation therapy: Secondary | ICD-10-CM | POA: Diagnosis not present

## 2016-05-14 DIAGNOSIS — D62 Acute posthemorrhagic anemia: Secondary | ICD-10-CM | POA: Diagnosis present

## 2016-05-14 DIAGNOSIS — K922 Gastrointestinal hemorrhage, unspecified: Secondary | ICD-10-CM | POA: Diagnosis present

## 2016-05-14 DIAGNOSIS — G629 Polyneuropathy, unspecified: Secondary | ICD-10-CM | POA: Diagnosis not present

## 2016-05-14 DIAGNOSIS — Z880 Allergy status to penicillin: Secondary | ICD-10-CM

## 2016-05-14 DIAGNOSIS — Z7982 Long term (current) use of aspirin: Secondary | ICD-10-CM

## 2016-05-14 DIAGNOSIS — G4733 Obstructive sleep apnea (adult) (pediatric): Secondary | ICD-10-CM | POA: Diagnosis not present

## 2016-05-14 DIAGNOSIS — I480 Paroxysmal atrial fibrillation: Secondary | ICD-10-CM | POA: Diagnosis present

## 2016-05-14 DIAGNOSIS — R42 Dizziness and giddiness: Secondary | ICD-10-CM | POA: Diagnosis present

## 2016-05-14 DIAGNOSIS — I272 Pulmonary hypertension, unspecified: Secondary | ICD-10-CM | POA: Diagnosis present

## 2016-05-14 DIAGNOSIS — I10 Essential (primary) hypertension: Secondary | ICD-10-CM | POA: Diagnosis present

## 2016-05-14 DIAGNOSIS — I110 Hypertensive heart disease with heart failure: Secondary | ICD-10-CM | POA: Diagnosis not present

## 2016-05-14 DIAGNOSIS — M109 Gout, unspecified: Secondary | ICD-10-CM | POA: Diagnosis present

## 2016-05-14 DIAGNOSIS — J449 Chronic obstructive pulmonary disease, unspecified: Secondary | ICD-10-CM | POA: Diagnosis not present

## 2016-05-14 DIAGNOSIS — D500 Iron deficiency anemia secondary to blood loss (chronic): Secondary | ICD-10-CM | POA: Diagnosis present

## 2016-05-14 DIAGNOSIS — I509 Heart failure, unspecified: Secondary | ICD-10-CM | POA: Diagnosis not present

## 2016-05-14 DIAGNOSIS — Z888 Allergy status to other drugs, medicaments and biological substances status: Secondary | ICD-10-CM

## 2016-05-14 DIAGNOSIS — E662 Morbid (severe) obesity with alveolar hypoventilation: Secondary | ICD-10-CM | POA: Diagnosis not present

## 2016-05-14 DIAGNOSIS — K31819 Angiodysplasia of stomach and duodenum without bleeding: Secondary | ICD-10-CM | POA: Diagnosis present

## 2016-05-14 DIAGNOSIS — E669 Obesity, unspecified: Secondary | ICD-10-CM | POA: Diagnosis present

## 2016-05-14 DIAGNOSIS — F319 Bipolar disorder, unspecified: Secondary | ICD-10-CM | POA: Diagnosis present

## 2016-05-14 DIAGNOSIS — E1140 Type 2 diabetes mellitus with diabetic neuropathy, unspecified: Secondary | ICD-10-CM | POA: Diagnosis present

## 2016-05-14 DIAGNOSIS — C61 Malignant neoplasm of prostate: Secondary | ICD-10-CM | POA: Diagnosis not present

## 2016-05-14 DIAGNOSIS — Z87891 Personal history of nicotine dependence: Secondary | ICD-10-CM

## 2016-05-14 DIAGNOSIS — E785 Hyperlipidemia, unspecified: Secondary | ICD-10-CM | POA: Diagnosis not present

## 2016-05-14 DIAGNOSIS — R0600 Dyspnea, unspecified: Secondary | ICD-10-CM

## 2016-05-14 DIAGNOSIS — Z7984 Long term (current) use of oral hypoglycemic drugs: Secondary | ICD-10-CM

## 2016-05-14 LAB — COMPREHENSIVE METABOLIC PANEL
ALK PHOS: 77 U/L (ref 38–126)
ALT: 22 U/L (ref 17–63)
ANION GAP: 7 (ref 5–15)
AST: 30 U/L (ref 15–41)
Albumin: 4.2 g/dL (ref 3.5–5.0)
BILIRUBIN TOTAL: 0.4 mg/dL (ref 0.3–1.2)
BUN: 18 mg/dL (ref 6–20)
CALCIUM: 9 mg/dL (ref 8.9–10.3)
CO2: 27 mmol/L (ref 22–32)
Chloride: 106 mmol/L (ref 101–111)
Creatinine, Ser: 1.14 mg/dL (ref 0.61–1.24)
GFR, EST NON AFRICAN AMERICAN: 58 mL/min — AB (ref >60)
GLUCOSE: 145 mg/dL — AB (ref 65–99)
POTASSIUM: 3.9 mmol/L (ref 3.5–5.1)
Sodium: 140 mmol/L (ref 135–145)
TOTAL PROTEIN: 7.4 g/dL (ref 6.5–8.1)

## 2016-05-14 LAB — CBC
HEMATOCRIT: 25.2 % — AB (ref 39.0–52.0)
HEMOGLOBIN: 7.7 g/dL — AB (ref 13.0–17.0)
MCH: 27 pg (ref 26.0–34.0)
MCHC: 30.6 g/dL (ref 30.0–36.0)
MCV: 88.4 fL (ref 78.0–100.0)
Platelets: 120 10*3/uL — ABNORMAL LOW (ref 150–400)
RBC: 2.85 MIL/uL — ABNORMAL LOW (ref 4.22–5.81)
RDW: 20.2 % — ABNORMAL HIGH (ref 11.5–15.5)
WBC: 2.5 10*3/uL — AB (ref 4.0–10.5)

## 2016-05-14 LAB — PROTIME-INR
INR: 1.03
PROTHROMBIN TIME: 13.6 s (ref 11.4–15.2)

## 2016-05-14 LAB — CBG MONITORING, ED: GLUCOSE-CAPILLARY: 123 mg/dL — AB (ref 65–99)

## 2016-05-14 LAB — POC OCCULT BLOOD, ED: Fecal Occult Bld: POSITIVE — AB

## 2016-05-14 MED ORDER — SODIUM CHLORIDE 0.9 % IV SOLN
80.0000 mg | Freq: Once | INTRAVENOUS | Status: AC
  Administered 2016-05-14: 20:00:00 80 mg via INTRAVENOUS
  Filled 2016-05-14: qty 80

## 2016-05-14 MED ORDER — PANTOPRAZOLE SODIUM 40 MG IV SOLR: 40.0000 mg | Freq: Two times a day (BID) | INTRAVENOUS | Status: DC

## 2016-05-14 MED ORDER — SODIUM CHLORIDE 0.9 % IV SOLN
8.0000 mg/h | INTRAVENOUS | Status: DC
  Administered 2016-05-14 – 2016-05-17 (×6): 8 mg/h via INTRAVENOUS
  Filled 2016-05-14 (×10): qty 80

## 2016-05-14 NOTE — Progress Notes (Signed)
Per Dr. Teena Irani order delivered patient to [facility] emergency room, bed 19. Assisted patient into bed. Patient in no distress upon my exit. Provided Minna Merritts, RN with report.

## 2016-05-14 NOTE — ED Notes (Signed)
Bed: AY30 Expected date:  Expected time:  Means of arrival:  Comments: Cancer center-GI Bleed

## 2016-05-14 NOTE — Progress Notes (Signed)
Received patient in the clinic today following radiation treatment. Patient concerned he "is bleeding again." Patient has a history of a GI bleed that has required multiple transfusions in recent months.Patient explains he was seen by Dr. Amedeo Plenty, gastroenterologist, yesterday and found to have a hemoglobin of 7.5. Therapist, Vicente Males, reports the patient was to weak to stand following treatment. Patient sitting in wheelchair now. Patient reports passing black tarry stool.

## 2016-05-14 NOTE — ED Triage Notes (Signed)
PT RECEIVED FROM THE CANCER CENTER FOR EXTREME WEAKNESS AFTER HIS 20TH RADIATION TX FOR PROSTATE CA. PT HAS A HX OF GI BLEEDING AND HAD BLACK STOOLS WITH ABDOMINAL DISTENTION SINCE YESTERDAY. PT HAD A HGB 7.5 YESTERDAY. PT DENIES PAIN AT THIS TIME. BUT STILL FELLS DIZZY.

## 2016-05-14 NOTE — H&P (Signed)
History and Physical    Joel [name] FMB:[id number] DOB: 05-09-1932 DOA: 05/14/2016  PCP: Wenda Low, MD   Patient coming from: [place].  I have personally briefly reviewed patient's old medical records in [place]  Chief Complaint: Melena.  HPI: Joel [name] is a 83 y.o. male with medical history significant of allergic rhinitis, anemia, anxiety, aortic stenosis, chronic atrial fibrillation, bipolar affective disorder, COPD on home oxygen, diabetic neuropathy, hyperlipidemia, gout, hypertension, prostate cancer, type 2 diabetes who was referred by the cancer center to the emergency department after the patient has been feeling progressively worse with fatigue, dyspnea, dizziness and reports very melanotic stools for the past two days. He saw his gastroenterologist yesterday who He denies chest pain, abdominal pain, nausea, emesis, diarrhea or constipation. He denies frequency or dysuria.  ED Course: The patient's fecal occult blood was positive. WBC 2.5, hemoglobin 7.7 mg/dL and platelets 120. His CMP showed a glucose of 145 mg/dL, otherwise is within normal limits. He was given a 80 mg IVP bolus and then started on a Protonix infusion.  Review of Systems: As per HPI otherwise 10 point review of systems negative.    Past Medical History:  Diagnosis Date  . Allergic rhinitis   . Anemia   . Anxiety   . Aortic stenosis 2012   mild to mod   . Atrial fibrillation ([place]) 1991   with multiple DCCV  . Bipolar affective disorder ([place])   . COPD (chronic obstructive pulmonary disease) ([place])   . Diabetic neuropathy ([place])    in feet  . Diabetic neuropathy ([place])   . Dyslipidemia   . Fatigue    last year or so  . Gout   . Hypertension   . Insomnia   . Obesity   . On home oxygen therapy 2 1/2 liters with bipap at night  . OSA (obstructive sleep apnea)    severe, uses bipap 16 1/2 by 12 or 13 setting  . Prostate cancer ([place])   . Rectal bleeding 12/16/2015  . Type 2  diabetes mellitus ([place])   . Vertigo     Past Surgical History:  Procedure Laterality Date  . CARDIOVERSION  yrs ago   prior to 1998  . COLONOSCOPY WITH PROPOFOL N/A 01/23/2013   Procedure: COLONOSCOPY WITH PROPOFOL;  Surgeon: Garlan Fair, MD;  Location: [facility] ENDOSCOPY;  Service: Endoscopy;  Laterality: N/A;  . electro shock  1969   for depression  . ESOPHAGOGASTRODUODENOSCOPY N/A 03/03/2016   Procedure: ESOPHAGOGASTRODUODENOSCOPY (EGD);  Surgeon: Teena Irani, MD;  Location: [facility];  Service: Endoscopy;  Laterality: N/A;  . ESOPHAGOGASTRODUODENOSCOPY N/A 03/26/2016   Procedure: ESOPHAGOGASTRODUODENOSCOPY (EGD);  Surgeon: Teena Irani, MD;  Location: [facility] ENDOSCOPY;  Service: Endoscopy;  Laterality: N/A;  would like to use ultraslim scope  . ESOPHAGOGASTRODUODENOSCOPY (EGD) WITH PROPOFOL N/A 01/23/2013   Procedure: ESOPHAGOGASTRODUODENOSCOPY (EGD) WITH PROPOFOL;  Surgeon: Garlan Fair, MD;  Location: [facility] ENDOSCOPY;  Service: Endoscopy;  Laterality: N/A;  . ESOPHAGOGASTRODUODENOSCOPY (EGD) WITH PROPOFOL N/A 04/13/2016   Procedure: ESOPHAGOGASTRODUODENOSCOPY (EGD) WITH PROPOFOL;  Surgeon: Otis Brace, MD;  Location: [place];  Service: Gastroenterology;  Laterality: N/A;  . GIVENS CAPSULE STUDY N/A 03/03/2016   Procedure: GIVENS CAPSULE STUDY;  Surgeon: Teena Irani, MD;  Location: [facility] ENDOSCOPY;  Service: Endoscopy;  Laterality: N/A;  . HEMORRHOID SURGERY N/A 12/16/2015   Procedure: PROCTOSCOPY WITH CONTROL OF BLEEDING;  Surgeon: Fanny Skates, MD;  Location: [place];  Service: General;  Laterality: N/A;  . KNEE SURGERY  1970's   rt  . MAZE  1998  . PROSTATE BIOPSY    . SHOULDER SURGERY  7-8 yrs ago   rt     reports that he quit smoking about 39 years ago. His smoking use included Cigarettes. He has a 50.00 pack-year smoking history. He has never used smokeless tobacco. He reports that he does not drink alcohol or use drugs.  Allergies  Allergen Reactions  . Penicillins  Anaphylaxis    Has patient had a PCN reaction causing immediate rash, facial/tongue/throat swelling, SOB or lightheadedness with hypotension: unknown Has patient had a PCN reaction causing severe rash involving mucus membranes or skin necrosis: unknown Has patient had a PCN reaction that required hospitalization: unknown Has patient had a PCN reaction occurring within the last 10 years: no If all of the above answers are "NO", then may proceed with Cephalosporin use.   Marland Kitchen Antihistamines, Loratadine-Type Other (See Comments)    depression  . Other     Beta blockers-Novacaine  Caused depression    Family History  Problem Relation Age of Onset  . Tuberculosis Maternal Grandmother   . Heart attack Neg Hx   . Diabetes Neg Hx   . CAD Neg Hx   . Cancer Neg Hx     Prior to Admission medications   Medication Sig Start Date End Date Taking? Authorizing Provider  allopurinol (ZYLOPRIM) 100 MG tablet Take 100 mg by mouth daily.    Yes Historical Provider, MD  buPROPion (WELLBUTRIN XL) 150 MG 24 hr tablet Take 1 tablet (150 mg total) by mouth daily. 12/18/15  Yes Ambrose Finland, MD  citalopram (CELEXA) 40 MG tablet Take 40 mg by mouth daily.    Yes Historical Provider, MD  dextromethorphan-guaiFENesin (MUCINEX DM) 30-600 MG 12hr tablet Take 1 tablet by mouth 2 (two) times daily.   Yes Historical Provider, MD  diclofenac sodium (VOLTAREN) 1 % GEL Apply 1 application topically daily as needed (knee pain).   Yes Historical Provider, MD  diltiazem (CARTIA XT) 120 MG 24 hr capsule Take 120 mg by mouth at bedtime.   Yes Historical Provider, MD  fluticasone (FLONASE) 50 MCG/ACT nasal spray Place 2 sprays into both nostrils daily. Patient taking differently: Place 2 sprays into both nostrils daily as needed for allergies or rhinitis.  01/27/16  Yes Waynetta Pean, PA-C  furosemide (LASIX) 80 MG tablet Take 80 mg by mouth daily.  10/22/15  Yes Historical Provider, MD  iron polysaccharides (NIFEREX)  150 MG capsule Take 1 capsule (150 mg total) by mouth 2 (two) times daily. Patient taking differently: Take 150 mg by mouth daily.  10/23/14  Yes Eugenie Filler, MD  lovastatin (MEVACOR) 20 MG tablet Take 20 mg by mouth at bedtime.    Yes Historical Provider, MD  meclizine (ANTIVERT) 25 MG tablet Take 1 tablet (25 mg total) by mouth 3 (three) times daily as needed for dizziness. Patient taking differently: Take 25 mg by mouth 2 (two) times daily.  01/28/16  Yes Eber Jones, MD  metFORMIN (GLUCOPHAGE) 1000 MG tablet Take 1,000 mg by mouth 2 (two) times daily.  05/17/14  Yes Historical Provider, MD  Multiple Vitamins-Minerals (VISION FORMULA PO) Take 1 tablet by mouth 2 (two) times daily.   Yes Historical Provider, MD  pantoprazole (PROTONIX) 40 MG tablet Take 40 mg by mouth daily. 04/01/16  Yes Historical Provider, MD  potassium chloride SA (K-DUR,KLOR-CON) 20 MEQ tablet Take 20 mEq by mouth daily.  09/10/14  Yes Historical Provider,  MD  sitaGLIPtin (JANUVIA) 100 MG tablet Take 100 mg by mouth daily.   Yes Historical Provider, MD  temazepam (RESTORIL) 15 MG capsule Take 15 mg by mouth at bedtime.  02/04/16  Yes Historical Provider, MD  valsartan (DIOVAN) 160 MG tablet Take 160 mg by mouth daily.    Yes Historical Provider, MD  aspirin EC 325 MG tablet Take 1 tablet (325 mg total) by mouth daily. Patient not taking: Reported on 05/07/2016 03/04/16   Charlynne Cousins, MD  OXYGEN Inhale 2 L into the lungs as needed (shortness of breath).     Historical Provider, MD    Physical Exam:  Constitutional: NAD, calm, comfortable Vitals:   05/14/16 1544 05/14/16 1855  BP: (!) 111/58 116/71  Pulse: 62 75  Resp: 20 18  Temp: 97.7 F (36.5 C)   TempSrc: Oral   SpO2: 97% 99%  Weight: 105.1 kg (231 lb 12.8 oz)   Height: 6' (1.829 m)    Eyes: PERRL, lids and conjunctivae are pale. ENMT: Mucous membranes are very dry. Posterior pharynx clear of any exudate or lesions. Positive dentures.  Neck:  normal, supple, no masses, no thyromegaly Respiratory: clear to auscultation bilaterally, no wheezing, no crackles. Normal respiratory effort. No accessory muscle use.  Cardiovascular: Irregularly irregular, 2/6 systolic ejection murmur, no rubs / gallops. No extremity edema. 2+ pedal pulses. No carotid bruits.  Abdomen: Distended, positive epigastric surgical scar with incisional hernia, mild epigastric tenderness, no guarding/rebound/masses palpated. No hepatosplenomegaly. Bowel sounds positive.  Musculoskeletal: no clubbing / cyanosis. Good ROM, no contractures. Normal muscle tone.  Skin: no significant rashes, lesions, ulcers on limited skin exam Neurologic: CN 2-12 grossly intact. Sensation intact, DTR normal. Strength 5/5 in all 4.  Psychiatric: Normal judgment and insight. Alert and oriented x 3. Normal mood.    Labs on Admission: I have personally reviewed following labs and imaging studies  CBC:  Recent Labs Lab 05/14/16 1725  WBC 2.5*  HGB 7.7*  HCT 25.2*  MCV 88.4  PLT 846*   Basic Metabolic Panel:  Recent Labs Lab 05/14/16 1725  NA 140  K 3.9  CL 106  CO2 27  GLUCOSE 145*  BUN 18  CREATININE 1.14  CALCIUM 9.0   GFR: Estimated Creatinine Clearance: 63.7 mL/min (by C-G formula based on SCr of 1.14 mg/dL). Liver Function Tests:  Recent Labs Lab 05/14/16 1725  AST 30  ALT 22  ALKPHOS 77  BILITOT 0.4  PROT 7.4  ALBUMIN 4.2   No results for input(s): LIPASE, AMYLASE in the last 168 hours. No results for input(s): AMMONIA in the last 168 hours. Coagulation Profile:  Recent Labs Lab 05/14/16 1725  INR 1.03   Cardiac Enzymes: No results for input(s): CKTOTAL, CKMB, CKMBINDEX, TROPONINI in the last 168 hours. BNP (last 3 results) No results for input(s): PROBNP in the last 8760 hours. HbA1C: No results for input(s): HGBA1C in the last 72 hours. CBG: No results for input(s): GLUCAP in the last 168 hours. Lipid Profile: No results for input(s):  CHOL, HDL, LDLCALC, TRIG, CHOLHDL, LDLDIRECT in the last 72 hours. Thyroid Function Tests: No results for input(s): TSH, T4TOTAL, FREET4, T3FREE, THYROIDAB in the last 72 hours. Anemia Panel: No results for input(s): VITAMINB12, FOLATE, FERRITIN, TIBC, IRON, RETICCTPCT in the last 72 hours. Urine analysis:    Component Value Date/Time   COLORURINE YELLOW 01/27/2016 1730   APPEARANCEUR CLOUDY (A) 01/27/2016 1730   LABSPEC 1.019 01/27/2016 1730   PHURINE 5.0 01/27/2016 1730  GLUCOSEU NEGATIVE 01/27/2016 1730   HGBUR NEGATIVE 01/27/2016 1730   BILIRUBINUR NEGATIVE 01/27/2016 1730   KETONESUR 5 (A) 01/27/2016 1730   PROTEINUR NEGATIVE 01/27/2016 1730   UROBILINOGEN 0.2 10/22/2014 2136   NITRITE NEGATIVE 01/27/2016 1730   LEUKOCYTESUR NEGATIVE 01/27/2016 1730   Echocardiogram 06/29/2015  ------------------------------------------------------------------- LV EF: 55% -   60%  ------------------------------------------------------------------- Indications:      CHF - 428.0.  ------------------------------------------------------------------- History:   PMH:  Former Smoker  Atrial fibrillation.  Angina pectoris.  Congestive heart failure.  Chronic obstructive pulmonary disease.  Risk factors:  Diabetes mellitus.  ------------------------------------------------------------------- Study Conclusions  - Left ventricle: The cavity size was normal. Wall thickness was   increased increased in a pattern of mild to moderate LVH.   Systolic function was normal. The estimated ejection fraction was   in the range of 55% to 60%. Wall motion was normal; there were no   regional wall motion abnormalities. The study is not technically   sufficient to allow evaluation of LV diastolic function. - Aortic valve: Moderately calcified annulus. Probably trileaflet;   severely calcified leaflets. There was moderate stenosis. Mean   gradient (S): 16 mm Hg. Peak gradient (S): 26 mm Hg. VTI ratio of    LVOT to aortic valve: 0.49. Valve area (VTI): 1.52 cm^2. Valve   area (Vmax): 1.42 cm^2. Valve area (Vmean): 1.48 cm^2. - Mitral valve: Moderately calcified annulus. There was mild   regurgitation. - Right ventricle: The cavity size was mildly dilated. - Right atrium: Central venous pressure (est): 3 mm Hg. - Atrial septum: No defect or patent foramen ovale was identified. - Tricuspid valve: There was trivial regurgitation. - Pulmonary arteries: Systolic pressure could not be accurately   estimated. - Pericardium, extracardiac: There was no pericardial effusion.  Impressions:  - Mild to moderate LVH with LVEF 55-60%. Indeterminate diastolic   function in the setting of atrial fibrillation. Moderate MAC with   mild mitral regurgitation. Moderate calcific aortic stenosis as   outlined above. Mildly dilated RV. Trivial tricuspid   regurgitation.   Radiological Exams on Admission: No results found.   Assessment/Plan Principal Problem:   UGI bleed Admit to telemetry/inpatient. Continue IVF Nothing by mouth, except for ice chips. Pantoprazole 40 mg IVP every 12 hours. Monitor hematocrit and hemoglobin. Will be evaluated by GI in a.m.  Active Problems:   DM2 (diabetes mellitus, type 2) ([place]) Currently nothing by mouth. CBG monitoring every 8 hours. Hypoglycemia protocol as needed    OSA (obstructive sleep apnea) Continue nocturnal BiPAP.    Paroxysmal atrial fibrillation (HCC) CHA2DS2-VASc Score of at least 4. Continue Cardizem CD 120 mg by mouth daily. Not on anticoagulation due to frequent GI bleeds and chronic anemia.    Hypertension Continue Cardizem CD 120 mg by mouth daily. Continue valsartan 160 mg or generic equivalent by mouth daily. Monitor blood pressure, BUN, creatinine and electrolytes.    HLD (hyperlipidemia) Hold statin until clear for oral intake.    Anemia due to chronic blood loss Monitor hematocrit and hemoglobin. Transfuse if necessary.     COPD (chronic obstructive pulmonary disease) (HCC) Supplemental oxygen PRN. Bronchodilators as needed.    Bipolar I disorder ([place]) Denies depressive symptoms at this time. Continue Celexa 40 mg by mouth daily. Continue Wellbutrin 150 mg by mouth daily. Continue temazepam 15 mg by mouth at bedtime as needed for insomnia.    DVT prophylaxis: SCDs. Code Status: Full code. Family Communication:  Disposition Plan: Admit for H&H monitoring on Protonix infusion.  GI will evaluate in a.m. Consults called: Gastroenterology (). Admission status: Observation/telemetry.   Reubin Milan MD [facility] Pager [phone]  If 7PM-7AM, please contact night-coverage [email or web address] Password [facility]  05/14/2016, 6:59 PM

## 2016-05-14 NOTE — ED Provider Notes (Signed)
Emergency Department Provider Note   I have reviewed the triage vital signs and the nursing notes.   HISTORY  Chief Complaint Blood In Stools   HPI Joel [name] is a 83 y.o. male with PMH of a-fib (not anticoagulated), anemia, COPD, HLD, DM, and prostate cancer currently undergoing chemotherapy resents to the emergency department for evaluation of generalized weakness, down trending hemoglobin, black stools that returned this morning. Patient states he's had a Dyllon Henken history of gastric intestinal bleeding and black stools. He reports after multiple evaluations a small area of bleeding was found and treated. He returned to normal bowel movements but yesterday noticed some increasing fatigue. He had his hemoglobin checked by the gastroenterologist to noted it was getting lower advised that he should return to the emergency department if black stools return. Yesterday evening he noticed his stool getting somewhat darker. This morning he had "jet black" stool and worsening weakness. No fever, chills, or pain. No radiation of symptoms. No modifying factors.    Past Medical History:  Diagnosis Date  . Allergic rhinitis   . Anemia   . Anxiety   . Aortic stenosis 2012   mild to mod   . Atrial fibrillation ([place]) 1991   with multiple DCCV  . Bipolar affective disorder ([place])   . COPD (chronic obstructive pulmonary disease) ([place])   . Diabetic neuropathy ([place])    in feet  . Diabetic neuropathy ([place])   . Dyslipidemia   . Fatigue    last year or so  . Gout   . Hypertension   . Insomnia   . Obesity   . On home oxygen therapy 2 1/2 liters with bipap at night  . OSA (obstructive sleep apnea)    severe, uses bipap 16 1/2 by 12 or 13 setting  . Prostate cancer ([place])   . Rectal bleeding 12/16/2015  . Type 2 diabetes mellitus ([place])   . Vertigo     Patient Active Problem List   Diagnosis Date Noted  . GI bleed 05/15/2016  . UGI bleed 05/14/2016  . COPD (chronic obstructive pulmonary  disease) ([place]) 05/14/2016  . Chronic GI bleeding 04/12/2016  . HLD (hyperlipidemia) 04/12/2016  . Anemia due to chronic blood loss 04/12/2016  . Diabetes mellitus with complication ([place])   . BPPV (benign paroxysmal positional vertigo) 01/27/2016  . Hypertension 01/27/2016  . Malignant neoplasm of prostate ([place]) 01/07/2016  . Paroxysmal atrial fibrillation ([facility])   . Bipolar I disorder ([place])   . Bright red rectal bleeding 12/16/2015  . Rectal bleeding 12/16/2015  . Pulmonary hypertension ([place]) 06/29/2015  . Moderate aortic stenosis 04/17/2013  . Encounter for monitoring coumadin therapy 04/17/2013  . Anemia, iron deficiency 04/04/2013  . Dyspnea on exertion 11/18/2012  . Obesity hypoventilation syndrome ([place]) 09/19/2012  . ALLERGIC RHINITIS 06/24/2009  . DM2 (diabetes mellitus, type 2) ([place]) 03/13/2007  . OSA (obstructive sleep apnea) 03/13/2007  . COPD mixed type ([place]) 03/13/2007    Past Surgical History:  Procedure Laterality Date  . CARDIOVERSION  yrs ago   prior to 1998  . COLONOSCOPY WITH PROPOFOL N/A 01/23/2013   Procedure: COLONOSCOPY WITH PROPOFOL;  Surgeon: Garlan Fair, MD;  Location: [facility];  Service: Endoscopy;  Laterality: N/A;  . electro shock  1969   for depression  . ESOPHAGOGASTRODUODENOSCOPY N/A 03/03/2016   Procedure: ESOPHAGOGASTRODUODENOSCOPY (EGD);  Surgeon: Teena Irani, MD;  Location: [facility];  Service: Endoscopy;  Laterality: N/A;  . ESOPHAGOGASTRODUODENOSCOPY N/A 03/26/2016   Procedure: ESOPHAGOGASTRODUODENOSCOPY (EGD);  Surgeon: Teena Irani, MD;  Location: [facility];  Service: Endoscopy;  Laterality: N/A;  would like to use ultraslim scope  . ESOPHAGOGASTRODUODENOSCOPY (EGD) WITH PROPOFOL N/A 01/23/2013   Procedure: ESOPHAGOGASTRODUODENOSCOPY (EGD) WITH PROPOFOL;  Surgeon: Garlan Fair, MD;  Location: [facility];  Service: Endoscopy;  Laterality: N/A;  . ESOPHAGOGASTRODUODENOSCOPY (EGD) WITH PROPOFOL N/A 04/13/2016   Procedure:  ESOPHAGOGASTRODUODENOSCOPY (EGD) WITH PROPOFOL;  Surgeon: Otis Brace, MD;  Location: [facility];  Service: Gastroenterology;  Laterality: N/A;  . GIVENS CAPSULE STUDY N/A 03/03/2016   Procedure: GIVENS CAPSULE STUDY;  Surgeon: Teena Irani, MD;  Location: [facility];  Service: Endoscopy;  Laterality: N/A;  . HEMORRHOID SURGERY N/A 12/16/2015   Procedure: PROCTOSCOPY WITH CONTROL OF BLEEDING;  Surgeon: Fanny Skates, MD;  Location: [facility];  Service: General;  Laterality: N/A;  . KNEE SURGERY  1970's   rt  . MAZE  1998  . PROSTATE BIOPSY    . SHOULDER SURGERY  7-8 yrs ago   rt      Allergies Penicillins; Antihistamines, loratadine-type; and Other  Family History  Problem Relation Age of Onset  . Tuberculosis Maternal Grandmother   . Heart attack Neg Hx   . Diabetes Neg Hx   . CAD Neg Hx   . Cancer Neg Hx     Social History Social History  Substance Use Topics  . Smoking status: Former Smoker    Packs/day: 2.00    Years: 25.00    Types: Cigarettes    Quit date: 07/08/1976  . Smokeless tobacco: Never Used  . Alcohol use No     Comment: in AA since 1977, no alcohol since 1977    Review of Systems  Constitutional: No fever/chills Eyes: No visual changes. ENT: No sore throat. Cardiovascular: Denies chest pain. Respiratory: Denies shortness of breath. Gastrointestinal: Positive abdominal pain.  No nausea, no vomiting.  No diarrhea.  No constipation. Positive black stools.  Genitourinary: Negative for dysuria. Musculoskeletal: Negative for back pain. Skin: Negative for rash. Neurological: Negative for headaches, focal weakness or numbness.  10-point ROS otherwise negative.  ____________________________________________   PHYSICAL EXAM:  VITAL SIGNS: ED Triage Vitals  Enc Vitals Group     BP 05/14/16 1544 (!) 111/58     Pulse Rate 05/14/16 1544 62     Resp 05/14/16 1544 20     Temp 05/14/16 1544 97.7 F (36.5 C)     Temp Source 05/14/16 1544 Oral      SpO2 05/14/16 1544 97 %     Weight 05/14/16 1544 231 lb 12.8 oz (105.1 kg)     Height 05/14/16 1544 6' (1.829 m)   Constitutional: Alert and oriented. Well appearing and in no acute distress. Eyes: Conjunctivae are normal. PERRL. EOMI. Head: Atraumatic. Nose: No congestion/rhinnorhea. Mouth/Throat: Mucous membranes are moist.  Oropharynx non-erythematous. Neck: No stridor.   Cardiovascular: Normal rate, regular rhythm. Good peripheral circulation. Grossly normal heart sounds.   Respiratory: Normal respiratory effort.  No retractions. Lungs CTAB. Gastrointestinal: Soft and nontender. No distention. Positive melena on exam.  Musculoskeletal: No lower extremity tenderness nor edema. No gross deformities of extremities. Neurologic:  Normal speech and language. No gross focal neurologic deficits are appreciated.  Skin:  Skin is warm, dry and intact. No rash noted.  ____________________________________________   LABS (all labs ordered are listed, but only abnormal results are displayed)  Labs Reviewed  COMPREHENSIVE METABOLIC PANEL - Abnormal; Notable for the following:       Result Value   Glucose, Bld  145 (*)    GFR calc non Af Amer 58 (*)    All other components within normal limits  CBC - Abnormal; Notable for the following:    WBC 2.5 (*)    RBC 2.85 (*)    Hemoglobin 7.7 (*)    HCT 25.2 (*)    RDW 20.2 (*)    Platelets 120 (*)    All other components within normal limits  CBC - Abnormal; Notable for the following:    WBC 2.8 (*)    RBC 2.61 (*)    Hemoglobin 7.2 (*)    HCT 22.9 (*)    RDW 20.3 (*)    Platelets 109 (*)    All other components within normal limits  BASIC METABOLIC PANEL - Abnormal; Notable for the following:    Glucose, Bld 155 (*)    Calcium 8.6 (*)    GFR calc non Af Amer 54 (*)    All other components within normal limits  CBC - Abnormal; Notable for the following:    WBC 2.6 (*)    RBC 2.60 (*)    Hemoglobin 7.1 (*)    HCT 22.8 (*)    RDW 20.4  (*)    Platelets 110 (*)    All other components within normal limits  GLUCOSE, CAPILLARY - Abnormal; Notable for the following:    Glucose-Capillary 135 (*)    All other components within normal limits  CBC - Abnormal; Notable for the following:    WBC 3.8 (*)    RBC 3.24 (*)    Hemoglobin 8.8 (*)    HCT 27.9 (*)    RDW 20.8 (*)    Platelets 103 (*)    All other components within normal limits  CBC - Abnormal; Notable for the following:    RBC 3.30 (*)    Hemoglobin 8.9 (*)    HCT 28.4 (*)    RDW 21.4 (*)    Platelets 105 (*)    All other components within normal limits  GLUCOSE, CAPILLARY - Abnormal; Notable for the following:    Glucose-Capillary 166 (*)    All other components within normal limits  BASIC METABOLIC PANEL - Abnormal; Notable for the following:    Glucose, Bld 176 (*)    Calcium 8.4 (*)    All other components within normal limits  GLUCOSE, CAPILLARY - Abnormal; Notable for the following:    Glucose-Capillary 169 (*)    All other components within normal limits  CBC - Abnormal; Notable for the following:    RBC 3.36 (*)    Hemoglobin 9.2 (*)    HCT 29.0 (*)    RDW 21.1 (*)    Platelets 120 (*)    All other components within normal limits  GLUCOSE, CAPILLARY - Abnormal; Notable for the following:    Glucose-Capillary 182 (*)    All other components within normal limits  GLUCOSE, CAPILLARY - Abnormal; Notable for the following:    Glucose-Capillary 165 (*)    All other components within normal limits  CBC - Abnormal; Notable for the following:    WBC 3.2 (*)    RBC 3.16 (*)    Hemoglobin 8.5 (*)    HCT 27.1 (*)    RDW 21.0 (*)    Platelets 100 (*)    All other components within normal limits  BASIC METABOLIC PANEL - Abnormal; Notable for the following:    Glucose, Bld 155 (*)    Calcium 8.4 (*)  All other components within normal limits  GLUCOSE, CAPILLARY - Abnormal; Notable for the following:    Glucose-Capillary 207 (*)    All other  components within normal limits  POC OCCULT BLOOD, ED - Abnormal; Notable for the following:    Fecal Occult Bld POSITIVE (*)    All other components within normal limits  CBG MONITORING, ED - Abnormal; Notable for the following:    Glucose-Capillary 123 (*)    All other components within normal limits  PROTIME-INR  TYPE AND SCREEN  PREPARE RBC (CROSSMATCH)   ____________________________________________  RADIOLOGY  None ____________________________________________   PROCEDURES  Procedure(s) performed:   Procedures  CRITICAL CARE Performed by: Margette Fast Total critical care time: 30 minutes Critical care time was exclusive of separately billable procedures and treating other patients. Critical care was necessary to treat or prevent imminent or life-threatening deterioration. Critical care was time spent personally by me on the following activities: development of treatment plan with patient and/or surrogate as well as nursing, discussions with consultants, evaluation of patient's response to treatment, examination of patient, obtaining history from patient or surrogate, ordering and performing treatments and interventions, ordering and review of laboratory studies, ordering and review of radiographic studies, pulse oximetry and re-evaluation of patient's condition.  Nanda Quinton, MD Emergency Medicine  ____________________________________________   INITIAL IMPRESSION / ASSESSMENT AND PLAN / ED COURSE  Pertinent labs & imaging results that were available during my care of the patient were reviewed by me and considered in my medical decision making (see chart for details).  Patient presents to the emergency department for evaluation of black stools, weakness, and junction and hemoglobin. Patient has a [place] N[place]osu history of gastrointestinal bleeding. No hypotension or tachycardia at this time. No fever or chills. No abdominal discomfort. Black/dark stool on exam with no gross  blood. Starting protonix infusion.   08:40 PM Spoke with [facility] who will consult on the patient in the AM.   Discussed patient's case with  Hospitalist.Patient and family (if present) updated with plan. Care transferred to Hospitalist service.  I reviewed all nursing notes, vitals, pertinent old records, EKGs, labs, imaging (as available).  ____________________________________________  FINAL CLINICAL IMPRESSION(S) / ED DIAGNOSES  Final diagnoses:  Gastrointestinal hemorrhage with melena     MEDICATIONS GIVEN DURING THIS VISIT:  Medications  pantoprazole (PROTONIX) 80 mg in sodium chloride 0.9 % 250 mL (0.32 mg/mL) infusion (8 mg/hr Intravenous New Bag/Given 05/17/16 0452)  pantoprazole (PROTONIX) injection 40 mg (not administered)  ondansetron (ZOFRAN) tablet 4 mg (not administered)    Or  ondansetron (ZOFRAN) injection 4 mg (not administered)  diltiazem (CARDIZEM CD) 24 hr capsule 120 mg (120 mg Oral Given 05/16/16 2152)  buPROPion (WELLBUTRIN XL) 24 hr tablet 150 mg (150 mg Oral Given 05/16/16 1002)  citalopram (CELEXA) tablet 40 mg (40 mg Oral Given 05/16/16 1002)  temazepam (RESTORIL) capsule 15 mg (15 mg Oral Given 05/16/16 2152)  irbesartan (AVAPRO) tablet 150 mg (150 mg Oral Given 05/16/16 1002)  chlorhexidine (PERIDEX) 0.12 % solution 15 mL (15 mLs Mouth/Throat Given 05/16/16 2152)  insulin aspart (novoLOG) injection 0-9 Units (2 Units Subcutaneous Given 05/16/16 1704)  furosemide (LASIX) injection 60 mg (not administered)  pantoprazole (PROTONIX) 80 mg in sodium chloride 0.9 % 100 mL IVPB (0 mg Intravenous Stopped 05/14/16 2030)  0.9 %  sodium chloride infusion ( Intravenous Stopped 05/15/16 1246)  0.9 %  sodium chloride infusion ( Intravenous New Bag/Given 05/15/16 1719)  furosemide (LASIX) injection 60 mg (60 mg  Intravenous Given 05/16/16 1550)     NEW OUTPATIENT MEDICATIONS STARTED DURING THIS VISIT:  None   Note:  This document was prepared using Dragon voice  recognition software and may include unintentional dictation errors.  Nanda Quinton, MD Emergency Medicine   Margette Fast, MD 05/17/16 [phone]

## 2016-05-15 DIAGNOSIS — E662 Morbid (severe) obesity with alveolar hypoventilation: Secondary | ICD-10-CM | POA: Diagnosis not present

## 2016-05-15 DIAGNOSIS — D500 Iron deficiency anemia secondary to blood loss (chronic): Secondary | ICD-10-CM | POA: Diagnosis not present

## 2016-05-15 DIAGNOSIS — E785 Hyperlipidemia, unspecified: Secondary | ICD-10-CM | POA: Diagnosis not present

## 2016-05-15 DIAGNOSIS — I350 Nonrheumatic aortic (valve) stenosis: Secondary | ICD-10-CM | POA: Diagnosis not present

## 2016-05-15 DIAGNOSIS — J9610 Chronic respiratory failure, unspecified whether with hypoxia or hypercapnia: Secondary | ICD-10-CM | POA: Diagnosis not present

## 2016-05-15 DIAGNOSIS — I482 Chronic atrial fibrillation: Secondary | ICD-10-CM | POA: Diagnosis not present

## 2016-05-15 DIAGNOSIS — D62 Acute posthemorrhagic anemia: Secondary | ICD-10-CM | POA: Diagnosis not present

## 2016-05-15 DIAGNOSIS — Z510 Encounter for antineoplastic radiation therapy: Secondary | ICD-10-CM | POA: Diagnosis not present

## 2016-05-15 DIAGNOSIS — E669 Obesity, unspecified: Secondary | ICD-10-CM | POA: Diagnosis not present

## 2016-05-15 DIAGNOSIS — G4733 Obstructive sleep apnea (adult) (pediatric): Secondary | ICD-10-CM

## 2016-05-15 DIAGNOSIS — K921 Melena: Secondary | ICD-10-CM | POA: Diagnosis not present

## 2016-05-15 DIAGNOSIS — I509 Heart failure, unspecified: Secondary | ICD-10-CM | POA: Diagnosis not present

## 2016-05-15 DIAGNOSIS — M109 Gout, unspecified: Secondary | ICD-10-CM | POA: Diagnosis not present

## 2016-05-15 DIAGNOSIS — E1140 Type 2 diabetes mellitus with diabetic neuropathy, unspecified: Secondary | ICD-10-CM | POA: Diagnosis not present

## 2016-05-15 DIAGNOSIS — I480 Paroxysmal atrial fibrillation: Secondary | ICD-10-CM

## 2016-05-15 DIAGNOSIS — J449 Chronic obstructive pulmonary disease, unspecified: Secondary | ICD-10-CM

## 2016-05-15 DIAGNOSIS — K449 Diaphragmatic hernia without obstruction or gangrene: Secondary | ICD-10-CM | POA: Diagnosis not present

## 2016-05-15 DIAGNOSIS — F419 Anxiety disorder, unspecified: Secondary | ICD-10-CM | POA: Diagnosis not present

## 2016-05-15 DIAGNOSIS — R42 Dizziness and giddiness: Secondary | ICD-10-CM | POA: Diagnosis not present

## 2016-05-15 DIAGNOSIS — F319 Bipolar disorder, unspecified: Secondary | ICD-10-CM | POA: Diagnosis not present

## 2016-05-15 DIAGNOSIS — K922 Gastrointestinal hemorrhage, unspecified: Secondary | ICD-10-CM | POA: Diagnosis not present

## 2016-05-15 DIAGNOSIS — I110 Hypertensive heart disease with heart failure: Secondary | ICD-10-CM | POA: Diagnosis not present

## 2016-05-15 DIAGNOSIS — K31819 Angiodysplasia of stomach and duodenum without bleeding: Secondary | ICD-10-CM | POA: Diagnosis not present

## 2016-05-15 DIAGNOSIS — Z9981 Dependence on supplemental oxygen: Secondary | ICD-10-CM | POA: Diagnosis not present

## 2016-05-15 DIAGNOSIS — I272 Pulmonary hypertension, unspecified: Secondary | ICD-10-CM | POA: Diagnosis not present

## 2016-05-15 DIAGNOSIS — C61 Malignant neoplasm of prostate: Secondary | ICD-10-CM | POA: Diagnosis not present

## 2016-05-15 DIAGNOSIS — Z79899 Other long term (current) drug therapy: Secondary | ICD-10-CM | POA: Diagnosis not present

## 2016-05-15 DIAGNOSIS — G629 Polyneuropathy, unspecified: Secondary | ICD-10-CM | POA: Diagnosis not present

## 2016-05-15 DIAGNOSIS — I10 Essential (primary) hypertension: Secondary | ICD-10-CM | POA: Diagnosis not present

## 2016-05-15 DIAGNOSIS — G4700 Insomnia, unspecified: Secondary | ICD-10-CM | POA: Diagnosis not present

## 2016-05-15 DIAGNOSIS — R0602 Shortness of breath: Secondary | ICD-10-CM | POA: Diagnosis not present

## 2016-05-15 DIAGNOSIS — E119 Type 2 diabetes mellitus without complications: Secondary | ICD-10-CM | POA: Diagnosis not present

## 2016-05-15 DIAGNOSIS — J410 Simple chronic bronchitis: Secondary | ICD-10-CM | POA: Diagnosis not present

## 2016-05-15 LAB — CBC
HCT: 22.9 % — ABNORMAL LOW (ref 39.0–52.0)
HCT: 22.8 % — ABNORMAL LOW (ref 39.0–52.0)
HEMATOCRIT: 27.9 % — AB (ref 39.0–52.0)
HEMOGLOBIN: 7.2 g/dL — AB (ref 13.0–17.0)
Hemoglobin: 7.1 g/dL — ABNORMAL LOW (ref 13.0–17.0)
Hemoglobin: 8.8 g/dL — ABNORMAL LOW (ref 13.0–17.0)
MCH: 27.6 pg (ref 26.0–34.0)
MCH: 27.3 pg (ref 26.0–34.0)
MCH: 27.2 pg (ref 26.0–34.0)
MCHC: 31.4 g/dL (ref 30.0–36.0)
MCHC: 31.1 g/dL (ref 30.0–36.0)
MCHC: 31.5 g/dL (ref 30.0–36.0)
MCV: 87.7 fL (ref 78.0–100.0)
MCV: 87.7 fL (ref 78.0–100.0)
MCV: 86.1 fL (ref 78.0–100.0)
PLATELETS: 110 10*3/uL — AB (ref 150–400)
PLATELETS: 103 10*3/uL — AB (ref 150–400)
Platelets: 109 10*3/uL — ABNORMAL LOW (ref 150–400)
RBC: 2.61 MIL/uL — ABNORMAL LOW (ref 4.22–5.81)
RBC: 2.6 MIL/uL — ABNORMAL LOW (ref 4.22–5.81)
RBC: 3.24 MIL/uL — ABNORMAL LOW (ref 4.22–5.81)
RDW: 20.3 % — ABNORMAL HIGH (ref 11.5–15.5)
RDW: 20.4 % — AB (ref 11.5–15.5)
RDW: 20.8 % — AB (ref 11.5–15.5)
WBC: 2.8 10*3/uL — ABNORMAL LOW (ref 4.0–10.5)
WBC: 2.6 10*3/uL — AB (ref 4.0–10.5)
WBC: 3.8 10*3/uL — AB (ref 4.0–10.5)

## 2016-05-15 LAB — BASIC METABOLIC PANEL
Anion gap: 10 (ref 5–15)
BUN: 17 mg/dL (ref 6–20)
CHLORIDE: 103 mmol/L (ref 101–111)
CO2: 26 mmol/L (ref 22–32)
CREATININE: 1.22 mg/dL (ref 0.61–1.24)
Calcium: 8.6 mg/dL — ABNORMAL LOW (ref 8.9–10.3)
GFR calc Af Amer: >60 mL/min (ref >60)
GFR calc non Af Amer: 54 mL/min — ABNORMAL LOW (ref >60)
GLUCOSE: 155 mg/dL — AB (ref 65–99)
Potassium: 3.5 mmol/L (ref 3.5–5.1)
SODIUM: 139 mmol/L (ref 135–145)

## 2016-05-15 LAB — GLUCOSE, CAPILLARY
Glucose-Capillary: 135 mg/dL — ABNORMAL HIGH (ref 65–99)
Glucose-Capillary: 166 mg/dL — ABNORMAL HIGH (ref 65–99)

## 2016-05-15 LAB — PREPARE RBC (CROSSMATCH)

## 2016-05-15 MED ORDER — TEMAZEPAM 15 MG PO CAPS
15.0000 mg | ORAL_CAPSULE | Freq: Every day | ORAL | Status: DC
  Administered 2016-05-15 – 2016-05-17 (×4): 15 mg via ORAL
  Filled 2016-05-15 (×4): qty 1

## 2016-05-15 MED ORDER — CITALOPRAM HYDROBROMIDE 20 MG PO TABS
40.0000 mg | ORAL_TABLET | Freq: Every day | ORAL | Status: DC
  Administered 2016-05-15 – 2016-05-18 (×4): 40 mg via ORAL
  Filled 2016-05-15 (×4): qty 2

## 2016-05-15 MED ORDER — DILTIAZEM HCL ER COATED BEADS 120 MG PO CP24
120.0000 mg | ORAL_CAPSULE | Freq: Every day | ORAL | Status: DC
  Administered 2016-05-15 – 2016-05-17 (×4): 120 mg via ORAL
  Filled 2016-05-15 (×4): qty 1

## 2016-05-15 MED ORDER — IRBESARTAN 150 MG PO TABS
150.0000 mg | ORAL_TABLET | Freq: Every day | ORAL | Status: DC
  Administered 2016-05-15 – 2016-05-18 (×4): 150 mg via ORAL
  Filled 2016-05-15 (×4): qty 1

## 2016-05-15 MED ORDER — CHLORHEXIDINE GLUCONATE 0.12 % MT SOLN
15.0000 mL | Freq: Two times a day (BID) | OROMUCOSAL | Status: DC
  Administered 2016-05-15 – 2016-05-18 (×5): 15 mL via OROMUCOSAL
  Filled 2016-05-15 (×5): qty 15

## 2016-05-15 MED ORDER — ONDANSETRON HCL 4 MG PO TABS: 4.0000 mg | ORAL_TABLET | Freq: Four times a day (QID) | ORAL | Status: DC | PRN

## 2016-05-15 MED ORDER — SODIUM CHLORIDE 0.9 % IV SOLN
Freq: Once | INTRAVENOUS | Status: AC
  Administered 2016-05-15: 17:00:00 via INTRAVENOUS

## 2016-05-15 MED ORDER — INSULIN ASPART 100 UNIT/ML ~~LOC~~ SOLN
0.0000 [IU] | Freq: Three times a day (TID) | SUBCUTANEOUS | Status: DC
  Administered 2016-05-15 – 2016-05-17 (×5): 2 [IU] via SUBCUTANEOUS
  Administered 2016-05-17: 1 [IU] via SUBCUTANEOUS
  Administered 2016-05-17 – 2016-05-18 (×3): 2 [IU] via SUBCUTANEOUS

## 2016-05-15 MED ORDER — SODIUM CHLORIDE 0.9 % IV SOLN
Freq: Once | INTRAVENOUS | Status: AC
  Administered 2016-05-15: 09:00:00 via INTRAVENOUS

## 2016-05-15 MED ORDER — ONDANSETRON HCL 4 MG/2ML IJ SOLN: 4.0000 mg | Freq: Four times a day (QID) | INTRAMUSCULAR | Status: DC | PRN

## 2016-05-15 MED ORDER — BUPROPION HCL ER (XL) 150 MG PO TB24
150.0000 mg | ORAL_TABLET | Freq: Every day | ORAL | Status: DC
  Administered 2016-05-15 – 2016-05-18 (×4): 150 mg via ORAL
  Filled 2016-05-15 (×4): qty 1

## 2016-05-15 MED ORDER — SODIUM CHLORIDE 0.9 % IV SOLN
INTRAVENOUS | Status: DC
  Administered 2016-05-15 – 2016-05-16 (×3): via INTRAVENOUS

## 2016-05-15 NOTE — Progress Notes (Signed)
Assessment reviewed, and agree with previous assessment, will continue current plan of care

## 2016-05-15 NOTE — Progress Notes (Signed)
Received pt from [facility] in stable condition.2nd unit or bld ordered, pt prepped for 2nd unit. Resting without distress. Oriented to room, no distress noted. SRP, RN

## 2016-05-15 NOTE — Progress Notes (Signed)
Subjective: Recurrent melena. No abdominal pain. Is fatigued.  Objective: Vital signs in last 24 hours: Temp:  [97.7 F (36.5 C)-98.4 F (36.9 C)] 98.4 F (36.9 C) (04/14 1253) Pulse Rate:  [62-78] 68 (04/14 1253) Resp:  [14-24] 16 (04/14 1253) BP: (111-135)/(56-97) 115/56 (04/14 1253) SpO2:  [97 %-100 %] 97 % (04/14 1253) Weight:  [104.8 kg (231 lb 0.7 oz)-105.1 kg (231 lb 12.8 oz)] 104.8 kg (231 lb 0.7 oz) (04/14 0024) Weight change:  Last BM Date: 05/15/16  PE: GEN:  NAD, elderly somewhat short-of-breath, obese ABD:  Protuberant, soft, non-tender  Lab Results: CBC    Component Value Date/Time   WBC 2.6 (L) 05/15/2016 0605   RBC 2.60 (L) 05/15/2016 0605   HGB 7.1 (L) 05/15/2016 0605   HCT 22.8 (L) 05/15/2016 0605   PLT 110 (L) 05/15/2016 0605   MCV 87.7 05/15/2016 0605   MCH 27.3 05/15/2016 0605   MCHC 31.1 05/15/2016 0605   RDW 20.4 (H) 05/15/2016 0605   LYMPHSABS 0.9 04/12/2016 1710   MONOABS 0.7 04/12/2016 1710   EOSABS 0.2 04/12/2016 1710   BASOSABS 0.0 04/12/2016 1710   CMP     Component Value Date/Time   NA 139 05/15/2016 0125   K 3.5 05/15/2016 0125   CL 103 05/15/2016 0125   CO2 26 05/15/2016 0125   GLUCOSE 155 (H) 05/15/2016 0125   BUN 17 05/15/2016 0125   CREATININE 1.22 05/15/2016 0125   CALCIUM 8.6 (L) 05/15/2016 0125   PROT 7.4 05/14/2016 1725   ALBUMIN 4.2 05/14/2016 1725   AST 30 05/14/2016 1725   ALT 22 05/14/2016 1725   ALKPHOS 77 05/14/2016 1725   BILITOT 0.4 05/14/2016 1725   GFRNONAA 54 (L) 05/15/2016 0125   GFRAA >60 05/15/2016 0125   Assessment:  1.  Recurrent overt obscure GI bleeding, melena, prior endoscopy x 2 and capsule endoscopy.  Gastric avm recently cauterized in March and capsule in January showed blood in proximal small bowel. 2.  Acute blood loss anemia. 3.  Shortness of breath, anemia is at least playing some role.  Plan:  1.  Transfuse at least 2 units. 2.  Follow CBCs. 3.  Full liquid diet ok. 4.   Tentatively planning for enteroscopy likely Monday. 5.  If enteroscopy unrevealing, might have to consider repeat colonoscopy. 6  [facility] will follow.   Joel [name] 05/15/2016, 2:05 PM   Pager [phone] If no answer or after 5 PM call [phone]

## 2016-05-15 NOTE — Progress Notes (Signed)
PROGRESS NOTE                                                                                                                                                                                                             Patient Demographics:    Joel [name], is a 83 y.o. male, DOB - 17-Apr-1932, BTD:[id number]  Admit date - 05/14/2016   Admitting Physician Reubin Milan, MD  Outpatient Primary MD for the patient is Wenda Low, MD  LOS - 0  Outpatient Specialists: GI Dr Amedeo Plenty  Chief Complaint  Patient presents with  . Blood In Stools       Brief Narrative   83 y.o.malewith medical history significant for atrial fibrillation currently not on anticoagulation, diabetes, pulmonary hypertension in thesetting of COPD/sleep apnea and obesity hypoventilation syndrome, aortic stenosis, dyslipidemia and bipolar disorder. Since November 2017 patient has been admitted for recurrent GI bleeding symptoms (melena)with associated symptomatic anemia. Recurrent overt obscure GI bleeding, melena, prior endoscopy x 2 and capsule endoscopy.  Gastric avm recently cauterized in March and capsule in January showed blood in proximal small bowel.  Subjective:    Joel [name] today has, No headache, No chest pain, No abdominal pain - No Nausea,  No Cough - SOB.Fortunately that episode of bowel movement with melena overnight   Assessment  & Plan :    Principal Problem:   UGI bleed Active Problems:   DM2 (diabetes mellitus, type 2) (H[facility])   OSA (obstructive sleep apnea)   Paroxysmal atrial fibrillation (HCC)   Bipolar I disorder (H[facility])   Hypertension   HLD (hyperlipidemia)   Anemia due to chronic blood loss   COPD (chronic obstructive pulmonary disease) (HCC)  Recurrent GI bleeding -Patient has been hospitalized times since November 2017 secondary to melena/GI bleeding . - Status of workup in the past including capsule endoscopy in February revealed likely  jejunal source but was unable to be located by enteroscopy, EGD on 3/13 with finding of AVM in gastric body treated with APC - Input greatly appreciated, and plan for endoscopy this Monday  Anemia due to acute  blood loss - Secondary to above, will transfuse 2 units PRBC, H&H closely and transfuse as needed.  DM2  - hold metformin, continue with insulin sliding scale   Paroxysmal atrial fibrillation  -Has not been on anticoagulation since early February  2018 when Coumadin was stopped  Essential Hypertension - Continue with home medication   OSA (obstructive sleep apnea) Nocturnal CPAP  COPD mixed type/Pulmonary hypertension -Currently asymptomatic  Prostate cancer -Has received 1 dose of Lupron at urology office with doses separated by 6 month intervals -on  radiation therapy  Bipolar I disorder  -Continue preadmission Celexa and Wellbutrin -Restoril at HS     Code Status : Full  Family Communication  : none at bedside  Disposition Plan  : Home when stable  Consults  :  GI  Procedures  : None  DVT Prophylaxis  :  SCDs  Lab Results  Component Value Date   PLT 110 (L) 05/15/2016    Antibiotics  :    Anti-infectives    None        Objective:   Vitals:   05/15/16 0024 05/15/16 0654 05/15/16 1230 05/15/16 1253  BP: 129/67 (!) 117/56 (!) 114/58 (!) 115/56  Pulse: 78 74 73 68  Resp: 20 18 16 16   Temp: 98.1 F (36.7 C) 98.2 F (36.8 C) 97.7 F (36.5 C) 98.4 F (36.9 C)  TempSrc: Oral Oral Oral Oral  SpO2: 99% 97% 97% 97%  Weight: 104.8 kg (231 lb 0.7 oz)     Height:        Wt Readings from Last 3 Encounters:  05/15/16 104.8 kg (231 lb 0.7 oz)  05/07/16 104.6 kg (230 lb 9.6 oz)  04/12/16 99.8 kg (220 lb 0.3 oz)     Intake/Output Summary (Last 24 hours) at 05/15/16 1425 Last data filed at 05/15/16 1100  Gross per 24 hour  Intake          1586.67 ml  Output              300 ml  Net          1286.67 ml     Physical Exam  Awake  Alert, Oriented X 3,  Supple Neck,No JVD,  Symmetrical Chest wall movement, Good air movement bilaterally, CTAB irr irr,No Gallops,Rubs , No Parasternal Heave +ve B.Sounds, Abd Soft, No tenderness, No rebound - guarding or rigidity. No Cyanosis, Clubbing or edema, No new Rash or bruise      Data Review:    CBC  Recent Labs Lab 05/14/16 1725 05/15/16 0125 05/15/16 0605  WBC 2.5* 2.8* 2.6*  HGB 7.7* 7.2* 7.1*  HCT 25.2* 22.9* 22.8*  PLT 120* 109* 110*  MCV 88.4 87.7 87.7  MCH 27.0 27.6 27.3  MCHC 30.6 31.4 31.1  RDW 20.2* 20.3* 20.4*    Chemistries   Recent Labs Lab 05/14/16 1725 05/15/16 0125  NA 140 139  K 3.9 3.5  CL 106 103  CO2 27 26  GLUCOSE 145* 155*  BUN 18 17  CREATININE 1.14 1.22  CALCIUM 9.0 8.6*  AST 30  --   ALT 22  --   ALKPHOS 77  --   BILITOT 0.4  --    ------------------------------------------------------------------------------------------------------------------ No results for input(s): CHOL, HDL, LDLCALC, TRIG, CHOLHDL, LDLDIRECT in the last 72 hours.  Lab Results  Component Value Date   HGBA1C 5.6 04/12/2016   ------------------------------------------------------------------------------------------------------------------ No results for input(s): TSH, T4TOTAL, T3FREE, THYROIDAB in the last 72 hours.  Invalid input(s): FREET3 ------------------------------------------------------------------------------------------------------------------ No results for input(s): VITAMINB12, FOLATE, FERRITIN, TIBC, IRON, RETICCTPCT in the last 72 hours.  Coagulation profile  Recent Labs Lab 05/14/16 1725  INR 1.03    No results for input(s): DDIMER in the last 72 hours.  Cardiac  Enzymes No results for input(s): CKMB, TROPONINI, MYOGLOBIN in the last 168 hours.  Invalid input(s): CK ------------------------------------------------------------------------------------------------------------------    Component Value Date/Time   BNP 57.0  06/28/2015 2227    Inpatient Medications  Scheduled Meds: . sodium chloride   Intravenous Once  . buPROPion  150 mg Oral Daily  . chlorhexidine  15 mL Mouth/Throat BID  . citalopram  40 mg Oral Daily  . diltiazem  120 mg Oral QHS  . irbesartan  150 mg Oral Daily  . [START ON 05/18/2016] pantoprazole  40 mg Intravenous Q12H  . temazepam  15 mg Oral QHS   Continuous Infusions: . sodium chloride 75 mL/hr at 05/15/16 1247  . pantoprozole (PROTONIX) infusion 8 mg/hr (05/15/16 0946)   PRN Meds:.ondansetron **OR** ondansetron (ZOFRAN) IV  Micro Results No results found for this or any previous visit (from the past 240 hour(s)).  Radiology Reports No results found.    [facility], Jerrilyn Messinger M.D on 05/15/2016 at 2:25 PM  Between 7am to 7pm - Pager - [phone]  After 7pm go to [email or web address] - password [facility]  [facility] -  Office  [phone]

## 2016-05-16 ENCOUNTER — Inpatient Hospital Stay (HOSPITAL_COMMUNITY): Payer: PPO

## 2016-05-16 DIAGNOSIS — I10 Essential (primary) hypertension: Secondary | ICD-10-CM

## 2016-05-16 DIAGNOSIS — E119 Type 2 diabetes mellitus without complications: Secondary | ICD-10-CM

## 2016-05-16 LAB — CBC
HCT: 28.4 % — ABNORMAL LOW (ref 39.0–52.0)
HCT: 29 % — ABNORMAL LOW (ref 39.0–52.0)
HEMOGLOBIN: 8.9 g/dL — AB (ref 13.0–17.0)
HEMOGLOBIN: 9.2 g/dL — AB (ref 13.0–17.0)
MCH: 27 pg (ref 26.0–34.0)
MCH: 27.4 pg (ref 26.0–34.0)
MCHC: 31.3 g/dL (ref 30.0–36.0)
MCHC: 31.7 g/dL (ref 30.0–36.0)
MCV: 86.1 fL (ref 78.0–100.0)
MCV: 86.3 fL (ref 78.0–100.0)
Platelets: 105 10*3/uL — ABNORMAL LOW (ref 150–400)
Platelets: 120 10*3/uL — ABNORMAL LOW (ref 150–400)
RBC: 3.3 MIL/uL — ABNORMAL LOW (ref 4.22–5.81)
RBC: 3.36 MIL/uL — ABNORMAL LOW (ref 4.22–5.81)
RDW: 21.4 % — ABNORMAL HIGH (ref 11.5–15.5)
RDW: 21.1 % — AB (ref 11.5–15.5)
WBC: 4.7 10*3/uL (ref 4.0–10.5)
WBC: 5.1 10*3/uL (ref 4.0–10.5)

## 2016-05-16 LAB — BASIC METABOLIC PANEL
Anion gap: 7 (ref 5–15)
BUN: 10 mg/dL (ref 6–20)
CHLORIDE: 107 mmol/L (ref 101–111)
CO2: 24 mmol/L (ref 22–32)
CREATININE: 1.02 mg/dL (ref 0.61–1.24)
Calcium: 8.4 mg/dL — ABNORMAL LOW (ref 8.9–10.3)
GFR calc non Af Amer: >60 mL/min (ref >60)
Glucose, Bld: 176 mg/dL — ABNORMAL HIGH (ref 65–99)
Potassium: 4.4 mmol/L (ref 3.5–5.1)
SODIUM: 138 mmol/L (ref 135–145)

## 2016-05-16 LAB — GLUCOSE, CAPILLARY
GLUCOSE-CAPILLARY: 169 mg/dL — AB (ref 65–99)
GLUCOSE-CAPILLARY: 207 mg/dL — AB (ref 65–99)
Glucose-Capillary: 182 mg/dL — ABNORMAL HIGH (ref 65–99)
Glucose-Capillary: 165 mg/dL — ABNORMAL HIGH (ref 65–99)

## 2016-05-16 MED ORDER — FUROSEMIDE 10 MG/ML IJ SOLN
60.0000 mg | Freq: Once | INTRAMUSCULAR | Status: AC
  Administered 2016-05-17: 60 mg via INTRAVENOUS
  Filled 2016-05-16: qty 6

## 2016-05-16 MED ORDER — FUROSEMIDE 10 MG/ML IJ SOLN
60.0000 mg | Freq: Once | INTRAMUSCULAR | Status: AC
  Administered 2016-05-16: 60 mg via INTRAVENOUS
  Filled 2016-05-16: qty 6

## 2016-05-16 MED ORDER — FUROSEMIDE 40 MG PO TABS: 80.0000 mg | ORAL_TABLET | Freq: Every day | ORAL | Status: DC

## 2016-05-16 NOTE — Progress Notes (Signed)
PROGRESS NOTE                                                                                                                                                                                                             Patient Demographics:    Joel [name], is a 83 y.o. male, DOB - December 06, 1932, WCB:[id number]  Admit date - 05/14/2016   Admitting Physician Reubin Milan, MD  Outpatient Primary MD for the patient is Wenda Low, MD  LOS - 1  Outpatient Specialists: GI Dr Amedeo Plenty  Chief Complaint  Patient presents with  . Blood In Stools       Brief Narrative   83 y.o.malewith medical history significant for atrial fibrillation currently not on anticoagulation, diabetes, pulmonary hypertension in thesetting of COPD/sleep apnea and obesity hypoventilation syndrome, aortic stenosis, dyslipidemia and bipolar disorder. Since November 2017 patient has been admitted for recurrent GI bleeding symptoms (melena)with associated symptomatic anemia. Recurrent overt obscure GI bleeding, melena, prior endoscopy x 2 and capsule endoscopy.  Gastric avm recently cauterized in March and capsule in January showed blood in proximal small bowel.  Subjective:    [name] today has, No headache, No chest pain, No abdominal pain - No Nausea,  No Cough , Reports he is more dyspneic today, still having melena.   Assessment  & Plan :    Principal Problem:   UGI bleed Active Problems:   DM2 (diabetes mellitus, type 2) (HCC)   OSA (obstructive sleep apnea)   Paroxysmal atrial fibrillation (HCC)   Bipolar I disorder (HCC)   Hypertension   HLD (hyperlipidemia)   Anemia due to chronic blood loss   COPD (chronic obstructive pulmonary disease) (HCC)   GI bleed  Recurrent GI bleeding -Patient has been hospitalized  8 times since November 2017 secondary to melena/GI bleeding . - Status of workup in the past including capsule endoscopy in February revealed likely  jejunal source but was unable to be located by enteroscopy, EGD on 3/13 with finding of AVM in gastric body treated with APC - GI Input greatly appreciated, and plan for endoscopy tomorrow  Anemia due to acute  blood loss - Secondary to above,transfused 2 units PRBC 4/14, hemoglobin is 8.8 today, continue to monitor closely,   DM2  - hold metformin, continue with insulin sliding scale  Chronic respiratory failure - At  baseline, he is on 2 L nasal cannula, but he does appear to be more dyspneic today, with worsening lower extremity edema most likely due to volume overload from transfusions, will start on IV Lasix, obtain 2 view chest x-ray  Paroxysmal atrial fibrillation  -Has not been on anticoagulation since early February 2018 when Coumadin was stopped  Essential Hypertension - Continue with home medication  OSA (obstructive sleep apnea) Nocturnal CPAP  COPD mixed type/Pulmonary hypertension -Currently asymptomatic  Prostate cancer -Has received 1 dose of Lupron at urology office with doses separated by 6 month intervals -on  radiation therapy  Bipolar I disorder  -Continue preadmission Celexa and Wellbutrin -Restoril at HS     Code Status : Full  Family Communication  : none at bedside  Disposition Plan  : Home when stable  Consults  :  GI  Procedures  : None  DVT Prophylaxis  :  SCDs  Lab Results  Component Value Date   PLT 105 (L) 05/16/2016    Antibiotics  :    Anti-infectives    None        Objective:   Vitals:   05/15/16 2000 05/15/16 2033 05/16/16 0432 05/16/16 1345  BP: 125/61  (!) 145/74 132/63  Pulse: 81 79 (!) 111 73  Resp: 18 18 20  (!) 22  Temp: 98 F (36.7 C)  99.5 F (37.5 C) 97.8 F (36.6 C)  TempSrc: Oral  Oral Oral  SpO2: 94% 98% 93% 93%  Weight:      Height:        Wt Readings from Last 3 Encounters:  05/15/16 107.3 kg (236 lb 8.9 oz)  05/07/16 104.6 kg (230 lb 9.6 oz)  04/12/16 99.8 kg (220 lb 0.3 oz)      Intake/Output Summary (Last 24 hours) at 05/16/16 1417 Last data filed at 05/16/16 1245  Gross per 24 hour  Intake          2936.83 ml  Output              200 ml  Net          2736.83 ml     Physical Exam  Awake Alert, Oriented X 3,  Supple Neck,No JVD,  Symmetrical Chest wall movement, Good air movement bilaterally, CTAB irr irr,No Gallops,Rubs , No Parasternal Heave +ve B.Sounds, Abd Soft, No tenderness, No rebound - guarding or rigidity. No Cyanosis, Clubbing , +1 edema, No new Rash or bruise      Data Review:    CBC  Recent Labs Lab 05/14/16 1725 05/15/16 0125 05/15/16 0605 05/15/16 2136 05/16/16 0508  WBC 2.5* 2.8* 2.6* 3.8* 4.7  HGB 7.7* 7.2* 7.1* 8.8* 8.9*  HCT 25.2* 22.9* 22.8* 27.9* 28.4*  PLT 120* 109* 110* 103* 105*  MCV 88.4 87.7 87.7 86.1 86.1  MCH 27.0 27.6 27.3 27.2 27.0  MCHC 30.6 31.4 31.1 31.5 31.3  RDW 20.2* 20.3* 20.4* 20.8* 21.4*    Chemistries   Recent Labs Lab 05/14/16 1725 05/15/16 0125 05/16/16 0508  NA 140 139 138  K 3.9 3.5 4.4  CL 106 103 107  CO2 27 26 24   GLUCOSE 145* 155* 176*  BUN 18 17 10   CREATININE 1.14 1.22 1.02  CALCIUM 9.0 8.6* 8.4*  AST 30  --   --   ALT 22  --   --   ALKPHOS 77  --   --   BILITOT 0.4  --   --    ------------------------------------------------------------------------------------------------------------------ No results for  input(s): CHOL, HDL, LDLCALC, TRIG, CHOLHDL, LDLDIRECT in the last 72 hours.  Lab Results  Component Value Date   HGBA1C 5.6 04/12/2016   ------------------------------------------------------------------------------------------------------------------ No results for input(s): TSH, T4TOTAL, T3FREE, THYROIDAB in the last 72 hours.  Invalid input(s): FREET3 ------------------------------------------------------------------------------------------------------------------ No results for input(s): VITAMINB12, FOLATE, FERRITIN, TIBC, IRON, RETICCTPCT in the last 72  hours.  Coagulation profile  Recent Labs Lab 05/14/16 1725  INR 1.03    No results for input(s): DDIMER in the last 72 hours.  Cardiac Enzymes No results for input(s): CKMB, TROPONINI, MYOGLOBIN in the last 168 hours.  Invalid input(s): CK ------------------------------------------------------------------------------------------------------------------    Component Value Date/Time   BNP 57.0 06/28/2015 2227    Inpatient Medications  Scheduled Meds: . buPROPion  150 mg Oral Daily  . chlorhexidine  15 mL Mouth/Throat BID  . citalopram  40 mg Oral Daily  . diltiazem  120 mg Oral QHS  . furosemide  60 mg Intravenous Once  . insulin aspart  0-9 Units Subcutaneous TID WC  . irbesartan  150 mg Oral Daily  . [START ON 05/18/2016] pantoprazole  40 mg Intravenous Q12H  . temazepam  15 mg Oral QHS   Continuous Infusions: . pantoprozole (PROTONIX) infusion 8 mg/hr (05/16/16 0713)   PRN Meds:.ondansetron **OR** ondansetron (ZOFRAN) IV  Micro Results No results found for this or any previous visit (from the past 240 hour(s)).  Radiology Reports No results found.    [facility], Marqus Macphee M.D on 05/16/2016 at 2:17 PM  Between 7am to 7pm - Pager - [phone]  After 7pm go to [email or web address] - password [facility]  [facility] -  Office  [phone]

## 2016-05-16 NOTE — Progress Notes (Signed)
Subjective: Still having black stool. Some mild epigastric discomfort.  Objective: Vital signs in last 24 hours: Temp:  [97.7 F (36.5 C)-99.5 F (37.5 C)] 99.5 F (37.5 C) (04/15 0432) Pulse Rate:  [64-111] 111 (04/15 0432) Resp:  [16-20] 20 (04/15 0432) BP: (118-145)/(51-74) 145/74 (04/15 0432) SpO2:  [93 %-98 %] 93 % (04/15 0432) Weight:  [107.3 kg (236 lb 8.9 oz)] 107.3 kg (236 lb 8.9 oz) (04/14 1734) Weight change: 2.156 kg (4 lb 12.1 oz) Last BM Date: 05/15/16  PE: GEN:  Overweight, chronically ill-appearing, tachypneic-appearing ABD:  Protuberant, mild distended, mild epigastric tenderness without peritonitis.  Lab Results: CBC    Component Value Date/Time   WBC 4.7 05/16/2016 0508   RBC 3.30 (L) 05/16/2016 0508   HGB 8.9 (L) 05/16/2016 0508   HCT 28.4 (L) 05/16/2016 0508   PLT 105 (L) 05/16/2016 0508   MCV 86.1 05/16/2016 0508   MCH 27.0 05/16/2016 0508   MCHC 31.3 05/16/2016 0508   RDW 21.4 (H) 05/16/2016 0508   LYMPHSABS 0.9 04/12/2016 1710   MONOABS 0.7 04/12/2016 1710   EOSABS 0.2 04/12/2016 1710   BASOSABS 0.0 04/12/2016 1710   CMP     Component Value Date/Time   NA 138 05/16/2016 0508   K 4.4 05/16/2016 0508   CL 107 05/16/2016 0508   CO2 24 05/16/2016 0508   GLUCOSE 176 (H) 05/16/2016 0508   BUN 10 05/16/2016 0508   CREATININE 1.02 05/16/2016 0508   CALCIUM 8.4 (L) 05/16/2016 0508   PROT 7.4 05/14/2016 1725   ALBUMIN 4.2 05/14/2016 1725   AST 30 05/14/2016 1725   ALT 22 05/14/2016 1725   ALKPHOS 77 05/14/2016 1725   BILITOT 0.4 05/14/2016 1725   GFRNONAA >60 05/16/2016 0508   GFRAA >60 05/16/2016 0508   Assessment:  1.  Recurrent overt obscure GI bleeding, melena, prior endoscopy x 2 and capsule endoscopy.  Gastric avm recently cauterized in March and capsule in January showed blood in proximal small bowel. 2.  Acute blood loss anemia.  Hgb stable post-transfusion. 3.  Shortness of breath, anemia is at least playing some  role.  Plan:  1.  Follow serial CBCs. 2.  Limit any non-essential IV fluids. 3.  Diet soft, NPO after midnight. 4.  I have asked Dr. [facility] to optimize patient's respiratory status today as possible. 5.  Enteroscopy tentatively planned for tomorrow morning.   Joel [name] 05/16/2016, 12:54 PM   Pager [phone] If no answer or after 5 PM call [phone]

## 2016-05-17 ENCOUNTER — Encounter (HOSPITAL_COMMUNITY)
Admission: EM | Disposition: A | Discharge status: Home Health | Payer: Self-pay | Source: Home / Self Care | Attending: Internal Medicine

## 2016-05-17 ENCOUNTER — Ambulatory Visit
Admission: RE | Admit: 2016-05-17 | Discharge: 2016-05-17 | Disposition: A | Discharge status: Home / Self Care | Payer: PPO | Source: Ambulatory Visit | Attending: Radiation Oncology | Admitting: Radiation Oncology

## 2016-05-17 ENCOUNTER — Inpatient Hospital Stay (HOSPITAL_COMMUNITY): Payer: PPO | Admitting: Certified Registered Nurse Anesthetist

## 2016-05-17 ENCOUNTER — Encounter (HOSPITAL_COMMUNITY): Payer: Self-pay | Admitting: *Deleted

## 2016-05-17 HISTORY — PX: ENTEROSCOPY: SHX5533

## 2016-05-17 LAB — CBC
HEMATOCRIT: 27.1 % — AB (ref 39.0–52.0)
HEMOGLOBIN: 8.5 g/dL — AB (ref 13.0–17.0)
MCH: 26.9 pg (ref 26.0–34.0)
MCHC: 31.4 g/dL (ref 30.0–36.0)
MCV: 85.8 fL (ref 78.0–100.0)
Platelets: 100 10*3/uL — ABNORMAL LOW (ref 150–400)
RBC: 3.16 MIL/uL — ABNORMAL LOW (ref 4.22–5.81)
RDW: 21 % — ABNORMAL HIGH (ref 11.5–15.5)
WBC: 3.2 10*3/uL — ABNORMAL LOW (ref 4.0–10.5)

## 2016-05-17 LAB — BASIC METABOLIC PANEL
Anion gap: 7 (ref 5–15)
BUN: 10 mg/dL (ref 6–20)
CHLORIDE: 106 mmol/L (ref 101–111)
CO2: 25 mmol/L (ref 22–32)
CREATININE: 1.05 mg/dL (ref 0.61–1.24)
Calcium: 8.4 mg/dL — ABNORMAL LOW (ref 8.9–10.3)
GFR calc Af Amer: >60 mL/min (ref >60)
GFR calc non Af Amer: >60 mL/min (ref >60)
Glucose, Bld: 155 mg/dL — ABNORMAL HIGH (ref 65–99)
Potassium: 4.1 mmol/L (ref 3.5–5.1)
SODIUM: 138 mmol/L (ref 135–145)

## 2016-05-17 LAB — GLUCOSE, CAPILLARY
GLUCOSE-CAPILLARY: 141 mg/dL — AB (ref 65–99)
GLUCOSE-CAPILLARY: 154 mg/dL — AB (ref 65–99)
GLUCOSE-CAPILLARY: 188 mg/dL — AB (ref 65–99)
GLUCOSE-CAPILLARY: 168 mg/dL — AB (ref 65–99)

## 2016-05-17 SURGERY — ENTEROSCOPY
Anesthesia: Monitor Anesthesia Care | Laterality: Left

## 2016-05-17 MED ORDER — PROPOFOL 10 MG/ML IV BOLUS
INTRAVENOUS | Status: AC
  Filled 2016-05-17: qty 40

## 2016-05-17 MED ORDER — ONDANSETRON HCL 4 MG/2ML IJ SOLN
INTRAMUSCULAR | Status: AC
  Filled 2016-05-17: qty 2

## 2016-05-17 MED ORDER — PANTOPRAZOLE SODIUM 40 MG IV SOLR
40.0000 mg | INTRAVENOUS | Status: DC
  Administered 2016-05-17: 40 mg via INTRAVENOUS
  Filled 2016-05-17: qty 40

## 2016-05-17 MED ORDER — LIDOCAINE 2% (20 MG/ML) 5 ML SYRINGE
INTRAMUSCULAR | Status: DC | PRN
  Administered 2016-05-17: 100 mg via INTRAVENOUS

## 2016-05-17 MED ORDER — FUROSEMIDE 40 MG PO TABS
80.0000 mg | ORAL_TABLET | Freq: Every day | ORAL | Status: DC
  Administered 2016-05-18: 80 mg via ORAL
  Filled 2016-05-17: qty 2
  Filled 2016-05-17: qty 1

## 2016-05-17 MED ORDER — PROPOFOL 10 MG/ML IV BOLUS
INTRAVENOUS | Status: DC | PRN
  Administered 2016-05-17: 10 mg via INTRAVENOUS

## 2016-05-17 MED ORDER — PROPOFOL 500 MG/50ML IV EMUL
INTRAVENOUS | Status: DC | PRN
  Administered 2016-05-17: 200 ug/kg/min via INTRAVENOUS

## 2016-05-17 MED ORDER — LIDOCAINE 2% (20 MG/ML) 5 ML SYRINGE
INTRAMUSCULAR | Status: AC
  Filled 2016-05-17: qty 5

## 2016-05-17 MED ORDER — ONDANSETRON HCL 4 MG/2ML IJ SOLN
INTRAMUSCULAR | Status: DC | PRN
  Administered 2016-05-17: 4 mg via INTRAVENOUS

## 2016-05-17 MED ORDER — LACTATED RINGERS IV SOLN
INTRAVENOUS | Status: DC | PRN
  Administered 2016-05-17: 12:00:00 via INTRAVENOUS

## 2016-05-17 NOTE — Brief Op Note (Addendum)
Normal enteroscopy to jejunum (extent reach probably proximal to mid-jejunum). If rebleeding occurs then needs referral to a tertiary care center for a double balloon enteroscopy (DBE) but no bleeding now and will need outpatient referral to [facility] for DBE. Suspect distal small bowel AVMs as source of the obscure GI bleeding. Clear liquid diet and advance as tolerated. Follow H/Hs. Will sign off. Call if questions.

## 2016-05-17 NOTE — Transfer of Care (Signed)
Immediate Anesthesia Transfer of Care Note  Patient: Joel [name]  Procedure(s) Performed: Procedure(s): ENTEROSCOPY (Left)  Patient Location: PACU  Anesthesia Type:MAC  Level of Consciousness:  sedated, patient cooperative and responds to stimulation  Airway & Oxygen Therapy:Patient Spontanous Breathing and Patient connected to face mask oxgen  Post-op Assessment:  Report given to PACU RN and Post -op Vital signs reviewed and stable  Post vital signs:  Reviewed and stable  Last Vitals:  Vitals:   05/17/16 0624 05/17/16 1053  BP: 138/61 134/70  Pulse: 78 78  Resp: 20 (!) 25  Temp:  02.7 C    Complications: No apparent anesthesia complications

## 2016-05-17 NOTE — Anesthesia Postprocedure Evaluation (Addendum)
Anesthesia Post Note  Patient: Joel [name]  Procedure(s) Performed: Procedure(s) (LRB): ENTEROSCOPY (Left)  Patient location during evaluation: Endoscopy Anesthesia Type: MAC Level of consciousness: awake, awake and alert and oriented Pain management: pain level controlled Vital Signs Assessment: post-procedure vital signs reviewed and stable Respiratory status: spontaneous breathing, nonlabored ventilation and respiratory function stable Cardiovascular status: blood pressure returned to baseline Anesthetic complications: no       Last Vitals:  Vitals:   05/17/16 1230 05/17/16 1256  BP: 115/70 119/62  Pulse:  68  Resp: (!) 22 20  Temp:  36.5 C    Last Pain:  Vitals:   05/17/16 1256  TempSrc: Axillary  PainSc:                  Mckaila Duffus COKER

## 2016-05-17 NOTE — Interval H&P Note (Signed)
History and Physical Interval Note:  05/17/2016 11:39 AM  Muncy  has presented today for surgery, with the diagnosis of Recurrent melena, history AVMs  The various methods of treatment have been discussed with the patient and family. After consideration of risks, benefits and other options for treatment, the patient has consented to  Procedure(s): ENTEROSCOPY (Left) as a surgical intervention .  The patient's history has been reviewed, patient examined, no change in status, stable for surgery.  I have reviewed the patient's chart and labs.  Questions were answered to the patient's satisfaction.     Richards C.

## 2016-05-17 NOTE — Anesthesia Preprocedure Evaluation (Signed)
Anesthesia Evaluation  Patient identified by MRN, date of birth, ID band Patient awake    Reviewed: Allergy & Precautions, NPO status , Patient's Chart, lab work & pertinent test results  Airway Mallampati: II  TM Distance: >3 FB Neck ROM: Full    Dental  (+) Edentulous Upper, Edentulous Lower   Pulmonary former smoker,    breath sounds clear to auscultation       Cardiovascular hypertension,  Rhythm:Irregular Rate:Normal     Neuro/Psych    GI/Hepatic   Endo/Other  diabetes  Renal/GU      Musculoskeletal   Abdominal   Peds  Hematology   Anesthesia Other Findings   Reproductive/Obstetrics                             Anesthesia Physical Anesthesia Plan  ASA: III  Anesthesia Plan: MAC   Post-op Pain Management:    Induction:   Airway Management Planned: Nasal Cannula and Natural Airway  Additional Equipment:   Intra-op Plan:   Post-operative Plan:   Informed Consent: I have reviewed the patients History and Physical, chart, labs and discussed the procedure including the risks, benefits and alternatives for the proposed anesthesia with the patient or authorized representative who has indicated his/her understanding and acceptance.     Plan Discussed with: CRNA and Anesthesiologist  Anesthesia Plan Comments:         Anesthesia Quick Evaluation

## 2016-05-17 NOTE — Op Note (Signed)
[facility] Patient Name: Joel [name] Procedure Date: 05/17/2016 MRN: [id number] Attending MD: Lear Ng , MD Date of Birth: 09-24-1932 CSN: [id number] Age: 83 Admit Type: Inpatient Procedure:                Small bowel enteroscopy Indications:              Obscure gastrointestinal bleeding Providers:                Lear Ng, MD, Carmie End, RN, Alfonso Patten, Technician, Virgia Land, CRNA Referring MD:              Medicines:                Propofol per Anesthesia, Monitored Anesthesia Care Complications:            No immediate complications. Estimated Blood Loss:     Estimated blood loss: none. Procedure:                Pre-Anesthesia Assessment:                           - Prior to the procedure, a History and Physical                            was performed, and patient medications and                            allergies were reviewed. The patient's tolerance of                            previous anesthesia was also reviewed. The risks                            and benefits of the procedure and the sedation                            options and risks were discussed with the patient.                            All questions were answered, and informed consent                            was obtained. Prior Anticoagulants: The patient has                            taken no previous anticoagulant or antiplatelet                            agents. ASA Grade Assessment: III - A patient with                            severe systemic disease. After reviewing the risks  and benefits, the patient was deemed in                            satisfactory condition to undergo the procedure.                           After obtaining informed consent, the endoscope was                            passed under direct vision. Throughout the                            procedure, the patient's blood  pressure, pulse, and                            oxygen saturations were monitored continuously. The                            EC-[id number] ([id number]) scope was introduced through                            the mouth and advanced to the mid-jejunum. The                            small bowel enteroscopy was accomplished without                            difficulty. The patient tolerated the procedure                            well. Scope In: Scope Out: Findings:      There was no evidence of significant pathology in the entire examined       portion of jejunum.      There was no evidence of significant pathology in the duodenal bulb, in       the second portion of the duodenum, in the third portion of the duodenum       and in the fourth portion of the duodenum.      A small hiatal hernia was present.      The exam of the stomach was otherwise normal.      The examined esophagus was normal. Impression:               - The examined portion of the jejunum was normal.                           - Normal duodenal bulb, second portion of the                            duodenum, third portion of the duodenum and fourth                            portion of the duodenum.                           -  Small hiatal hernia.                           - Normal esophagus.                           - No specimens collected.                           - Suspect small bowel AVMs out of the reach of the                            enteroscope as the source of his obscure GI                            bleeding. Recommendation:           - Clear liquid diet.                           - Follow H/Hs. If rebleeding occurs, then may need                            a double balloon enteroscopy at a tertiary facility.                           - Advance diet as tolerated. Procedure Code(s):        --- Professional ---                           [phone], Small intestinal endoscopy, enteroscopy                             beyond second portion of duodenum, not including                            ileum; diagnostic, including collection of                            specimen(s) by brushing or washing, when performed                            (separate procedure) Diagnosis Code(s):        --- Professional ---                           K92.2, Gastrointestinal hemorrhage, unspecified                           K44.9, Diaphragmatic hernia without obstruction or                            gangrene CPT copyright 2016 [employer]. All rights reserved. The codes documented in this report are preliminary and upon coder review may  be revised to meet current compliance requirements. Lear Ng, MD 05/17/2016 12:23:06 PM This report has been signed electronically. Number of Addenda: 0

## 2016-05-17 NOTE — Progress Notes (Signed)
PROGRESS NOTE                                                                                                                                                                                                             Patient Demographics:    Joel [name], is a 83 y.o. male, DOB - 05-Feb-1932, DTO:[id number]  Admit date - 05/14/2016   Admitting Physician Reubin Milan, MD  Outpatient Primary MD for the patient is Wenda Low, MD  LOS - 2  Outpatient Specialists: GI Dr Amedeo Plenty  Chief Complaint  Patient presents with  . Blood In Stools       Brief Narrative   83 y.o.malewith medical history significant for atrial fibrillation currently not on anticoagulation, diabetes, pulmonary hypertension in thesetting of COPD/sleep apnea and obesity hypoventilation syndrome, aortic stenosis, dyslipidemia and bipolar disorder. Since November 2017 patient has been admitted for recurrent GI bleeding symptoms (melena)with associated symptomatic anemia. Recurrent overt obscure GI bleeding, melena, prior endoscopy x 2 and capsule endoscopy.  Gastric avm recently cauterized in March and capsule in January showed blood in proximal small bowel.  Subjective:    Joel [name] today has, No headache, No chest pain, No abdominal pain - No Nausea,  No Cough , Reports he is more dyspneic today, still having melena.   Assessment  & Plan :    Principal Problem:   UGI bleed Active Problems:   DM2 (diabetes mellitus, type 2) (HCC)   OSA (obstructive sleep apnea)   Paroxysmal atrial fibrillation (HCC)   Bipolar I disorder (HCC)   Hypertension   HLD (hyperlipidemia)   Anemia due to chronic blood loss   COPD (chronic obstructive pulmonary disease) (HCC)   GI bleed  Recurrent GI bleeding -Patient has been hospitalized  8 times since November 2017 secondary to melena/GI bleeding . - Status of workup in the past including capsule endoscopy in February revealed likely  jejunal source but was unable to be located by enteroscopy, EGD on 3/13 with finding of AVM in gastric body treated with APC - GI Input greatly appreciated, and plan for enteroscopy today.  Anemia due to acute  blood loss - Secondary to above,transfused 2 units PRBC 4/14, hemoglobin is 8.5 today, continue to monitor closely,   DM2  - hold metformin, continue with insulin sliding scale  Chronic respiratory failure - At  baseline, he is on 2 L nasal cannula as needed, at baseline, respiratory status improved after IV Lasix, will resume home dose Lasix.  Paroxysmal atrial fibrillation  -Has not been on anticoagulation since early February 2018 when Coumadin was stopped  Essential Hypertension - Continue with home medication  OSA (obstructive sleep apnea) - Nocturnal CPAP  COPD mixed type/Pulmonary hypertension - Currently asymptomatic  Prostate cancer - Has received 1 dose of Lupron at urology office with doses separated by 6 month intervals - on  radiation therapy  Bipolar I disorder  -Continue preadmission Celexa and Wellbutrin -Restoril at HS     Code Status : Full  Family Communication  : none at bedside  Disposition Plan  : Home when stable  Consults  :  GI  Procedures  : None  DVT Prophylaxis  :  SCDs  Lab Results  Component Value Date   PLT 100 (L) 05/17/2016    Antibiotics  :    Anti-infectives    None        Objective:   Vitals:   05/16/16 2120 05/16/16 2301 05/17/16 0624 05/17/16 1053  BP: (!) 133/58  138/61 134/70  Pulse: 76 75 78 78  Resp: 20 20 20  (!) 25  Temp: 98.4 F (36.9 C)   97.8 F (36.6 C)  TempSrc: Oral   Oral  SpO2: 93% 94% 92% 92%  Weight:    107 kg (236 lb)  Height:    6' (1.829 m)    Wt Readings from Last 3 Encounters:  05/17/16 107 kg (236 lb)  05/07/16 104.6 kg (230 lb 9.6 oz)  04/12/16 99.8 kg (220 lb 0.3 oz)     Intake/Output Summary (Last 24 hours) at 05/17/16 1100 Last data filed at 05/17/16 0936   Gross per 24 hour  Intake             1175 ml  Output             2001 ml  Net             -826 ml     Physical Exam  Awake Alert, Oriented X 3,  Supple Neck,No JVD,  Symmetrical Chest wall movement, Good air movement bilaterally, CTAB irr irr,No Gallops,Rubs , No Parasternal Heave +ve B.Sounds, Abd Soft, No tenderness, No rebound - guarding or rigidity. No Cyanosis, Clubbing , +1 edema, No new Rash or bruise      Data Review:    CBC  Recent Labs Lab 05/15/16 0605 05/15/16 2136 05/16/16 0508 05/16/16 1427 05/17/16 0508  WBC 2.6* 3.8* 4.7 5.1 3.2*  HGB 7.1* 8.8* 8.9* 9.2* 8.5*  HCT 22.8* 27.9* 28.4* 29.0* 27.1*  PLT 110* 103* 105* 120* 100*  MCV 87.7 86.1 86.1 86.3 85.8  MCH 27.3 27.2 27.0 27.4 26.9  MCHC 31.1 31.5 31.3 31.7 31.4  RDW 20.4* 20.8* 21.4* 21.1* 21.0*    Chemistries   Recent Labs Lab 05/14/16 1725 05/15/16 0125 05/16/16 0508 05/17/16 0508  NA 140 139 138 138  K 3.9 3.5 4.4 4.1  CL 106 103 107 106  CO2 27 26 24 25   GLUCOSE 145* 155* 176* 155*  BUN 18 17 10 10   CREATININE 1.14 1.22 1.02 1.05  CALCIUM 9.0 8.6* 8.4* 8.4*  AST 30  --   --   --   ALT 22  --   --   --   ALKPHOS 77  --   --   --   BILITOT 0.4  --   --   --    ------------------------------------------------------------------------------------------------------------------  No results for input(s): CHOL, HDL, LDLCALC, TRIG, CHOLHDL, LDLDIRECT in the last 72 hours.  Lab Results  Component Value Date   HGBA1C 5.6 04/12/2016   ------------------------------------------------------------------------------------------------------------------ No results for input(s): TSH, T4TOTAL, T3FREE, THYROIDAB in the last 72 hours.  Invalid input(s): FREET3 ------------------------------------------------------------------------------------------------------------------ No results for input(s): VITAMINB12, FOLATE, FERRITIN, TIBC, IRON, RETICCTPCT in the last 72 hours.  Coagulation  profile  Recent Labs Lab 05/14/16 1725  INR 1.03    No results for input(s): DDIMER in the last 72 hours.  Cardiac Enzymes No results for input(s): CKMB, TROPONINI, MYOGLOBIN in the last 168 hours.  Invalid input(s): CK ------------------------------------------------------------------------------------------------------------------    Component Value Date/Time   BNP 57.0 06/28/2015 2227    Inpatient Medications  Scheduled Meds: . [MAR Hold] buPROPion  150 mg Oral Daily  . [MAR Hold] chlorhexidine  15 mL Mouth/Throat BID  . [MAR Hold] citalopram  40 mg Oral Daily  . [MAR Hold] diltiazem  120 mg Oral QHS  . [START ON 05/18/2016] furosemide  80 mg Oral Daily  . [MAR Hold] insulin aspart  0-9 Units Subcutaneous TID WC  . [MAR Hold] irbesartan  150 mg Oral Daily  . [MAR Hold] pantoprazole  40 mg Intravenous Q12H  . [MAR Hold] temazepam  15 mg Oral QHS   Continuous Infusions: . pantoprozole (PROTONIX) infusion 8 mg/hr (05/17/16 0452)   PRN Meds:.[MAR Hold] ondansetron **OR** [MAR Hold] ondansetron (ZOFRAN) IV  Micro Results No results found for this or any previous visit (from the past 240 hour(s)).  Radiology Reports Dg Chest 2 View  Result Date: 05/16/2016 CLINICAL DATA:  Acute shortness of breath. EXAM: CHEST  2 VIEW COMPARISON:  01/27/2016 and prior radiographs FINDINGS: Cardiomegaly with pulmonary vascular congestion noted. Mild interstitial opacities may represent mild interstitial edema. Small bilateral pleural effusions are noted. There is no evidence of pneumothorax. No other changes noted. Median sternotomy wires are identified. IMPRESSION: Cardiomegaly with pulmonary vascular congestion and probable mild interstitial pulmonary edema. Small bilateral pleural effusions. Electronically Signed   By: Margarette Canada M.D.   On: 05/16/2016 14:42      Nastasha Reising M.D on 05/17/2016 at 11:00 AM  Between 7am to 7pm - Pager - [phone]  After 7pm go to [email or web address]  - password [facility]  [facility] -  Office  [phone]

## 2016-05-17 NOTE — H&P (View-Only) (Signed)
Subjective: Still having black stool. Some mild epigastric discomfort.  Objective: Vital signs in last 24 hours: Temp:  [97.7 F (36.5 C)-99.5 F (37.5 C)] 99.5 F (37.5 C) (04/15 0432) Pulse Rate:  [64-111] 111 (04/15 0432) Resp:  [16-20] 20 (04/15 0432) BP: (118-145)/(51-74) 145/74 (04/15 0432) SpO2:  [93 %-98 %] 93 % (04/15 0432) Weight:  [107.3 kg (236 lb 8.9 oz)] 107.3 kg (236 lb 8.9 oz) (04/14 1734) Weight change: 2.156 kg (4 lb 12.1 oz) Last BM Date: 05/15/16  PE: GEN:  Overweight, chronically ill-appearing, tachypneic-appearing ABD:  Protuberant, mild distended, mild epigastric tenderness without peritonitis.  Lab Results: CBC    Component Value Date/Time   WBC 4.7 05/16/2016 0508   RBC 3.30 (L) 05/16/2016 0508   HGB 8.9 (L) 05/16/2016 0508   HCT 28.4 (L) 05/16/2016 0508   PLT 105 (L) 05/16/2016 0508   MCV 86.1 05/16/2016 0508   MCH 27.0 05/16/2016 0508   MCHC 31.3 05/16/2016 0508   RDW 21.4 (H) 05/16/2016 0508   LYMPHSABS 0.9 04/12/2016 1710   MONOABS 0.7 04/12/2016 1710   EOSABS 0.2 04/12/2016 1710   BASOSABS 0.0 04/12/2016 1710   CMP     Component Value Date/Time   NA 138 05/16/2016 0508   K 4.4 05/16/2016 0508   CL 107 05/16/2016 0508   CO2 24 05/16/2016 0508   GLUCOSE 176 (H) 05/16/2016 0508   BUN 10 05/16/2016 0508   CREATININE 1.02 05/16/2016 0508   CALCIUM 8.4 (L) 05/16/2016 0508   PROT 7.4 05/14/2016 1725   ALBUMIN 4.2 05/14/2016 1725   AST 30 05/14/2016 1725   ALT 22 05/14/2016 1725   ALKPHOS 77 05/14/2016 1725   BILITOT 0.4 05/14/2016 1725   GFRNONAA >60 05/16/2016 0508   GFRAA >60 05/16/2016 0508   Assessment:  1.  Recurrent overt obscure GI bleeding, melena, prior endoscopy x 2 and capsule endoscopy.  Gastric avm recently cauterized in March and capsule in January showed blood in proximal small bowel. 2.  Acute blood loss anemia.  Hgb stable post-transfusion. 3.  Shortness of breath, anemia is at least playing some  role.  Plan:  1.  Follow serial CBCs. 2.  Limit any non-essential IV fluids. 3.  Diet soft, NPO after midnight. 4.  I have asked Dr. Waldron Labs to optimize patient's respiratory status today as possible. 5.  Enteroscopy tentatively planned for tomorrow morning.   Landry Dyke 05/16/2016, 12:54 PM   Pager [phone] If no answer or after 5 PM call [phone]

## 2016-05-18 ENCOUNTER — Ambulatory Visit
Admission: RE | Admit: 2016-05-18 | Discharge: 2016-05-18 | Disposition: A | Discharge status: Home / Self Care | Payer: PPO | Source: Ambulatory Visit | Attending: Radiation Oncology | Admitting: Radiation Oncology

## 2016-05-18 DIAGNOSIS — Z510 Encounter for antineoplastic radiation therapy: Secondary | ICD-10-CM | POA: Diagnosis not present

## 2016-05-18 DIAGNOSIS — C61 Malignant neoplasm of prostate: Secondary | ICD-10-CM | POA: Diagnosis not present

## 2016-05-18 LAB — BPAM RBC
BLOOD PRODUCT EXPIRATION DATE: 201805012359
BLOOD PRODUCT EXPIRATION DATE: 201805022359
Blood Product Expiration Date: 201805022359
ISSUE DATE / TIME: 201804141215
ISSUE DATE / TIME: 201804141713
UNIT TYPE AND RH: 5100
Unit Type and Rh: 5100
Unit Type and Rh: 5100

## 2016-05-18 LAB — TYPE AND SCREEN
ABO/RH(D): O POS
ANTIBODY SCREEN: NEGATIVE
UNIT DIVISION: 0
UNIT DIVISION: 0
Unit division: 0

## 2016-05-18 LAB — CBC
HEMATOCRIT: 27.6 % — AB (ref 39.0–52.0)
HEMOGLOBIN: 8.6 g/dL — AB (ref 13.0–17.0)
MCH: 27.1 pg (ref 26.0–34.0)
MCHC: 31.2 g/dL (ref 30.0–36.0)
MCV: 87.1 fL (ref 78.0–100.0)
Platelets: 113 10*3/uL — ABNORMAL LOW (ref 150–400)
RBC: 3.17 MIL/uL — AB (ref 4.22–5.81)
RDW: 20.3 % — ABNORMAL HIGH (ref 11.5–15.5)
WBC: 2.4 10*3/uL — AB (ref 4.0–10.5)

## 2016-05-18 LAB — BASIC METABOLIC PANEL
ANION GAP: 7 (ref 5–15)
BUN: 10 mg/dL (ref 6–20)
CHLORIDE: 104 mmol/L (ref 101–111)
CO2: 26 mmol/L (ref 22–32)
CREATININE: 1.05 mg/dL (ref 0.61–1.24)
Calcium: 8.6 mg/dL — ABNORMAL LOW (ref 8.9–10.3)
GFR calc non Af Amer: >60 mL/min (ref >60)
Glucose, Bld: 139 mg/dL — ABNORMAL HIGH (ref 65–99)
POTASSIUM: 3.5 mmol/L (ref 3.5–5.1)
SODIUM: 137 mmol/L (ref 135–145)

## 2016-05-18 LAB — GLUCOSE, CAPILLARY
GLUCOSE-CAPILLARY: 200 mg/dL — AB (ref 65–99)
GLUCOSE-CAPILLARY: 174 mg/dL — AB (ref 65–99)

## 2016-05-18 NOTE — Progress Notes (Signed)
Pt selected [facility] for HH. Referral given to in house rep.

## 2016-05-18 NOTE — Consult Note (Addendum)
   [facility] CM Inpatient Consult   05/18/2016  Joel [name] September 01, 1932 [id number]    Came to visit Mr. Wegner prior to his hospital discharge. He was anxious to leave. States he has radiation treatment today. He is agreeable to ongoing [facility] services. Please see chart review then notes for patient outreach details by [facility]. Mr. Ogas endorses he has been rather busy as of late with his radiation treatments and the like. However, her reports " I look forward to speaking with Lattie Haw [facility] RNCM  on Thursday. She is very nice".  Mr. Rogala previous admissions are related to GI bleeding. Provided contact information. Appreciative of visit. Inpatient RNCM made aware [facility] is active.   Will update [facility] RNCM as well.    Marthenia Rolling, MSN-Ed, RN,BSN [facility] Liaison [phone]

## 2016-05-18 NOTE — Evaluation (Addendum)
Physical Therapy Evaluation Patient Details Name: Joel [name] MRN: [id number] DOB: July 27, 1932 Today's Date: 05/18/2016   History of Present Illness  83 yo male admitted with UGIB. Hx of A fib, anemia, bipolar d/o, COPD-O2 PRN, A fib, obesity, neuropathy, prostate cancer, aortic stenosis, vertigo.   Clinical Impression  On eval, pt required Min guard-Min assist for mobility. He walked ~150 feet. Pt is at significant risk for falls when mobilizing without an assistive device. Improved stability and safety noted with use of hallway handrail. Strongly encouraged pt to use the RW for ambulation safety if he feels he cannot get around well with his quad cane. Recommend HHPT and home health aide.     Follow Up Recommendations Home health PT; 24 hour supervision/assist; Home Health Aide    Equipment Recommendations  None recommended by PT    Recommendations for Other Services       Precautions / Restrictions Precautions Precautions: Fall Restrictions Weight Bearing Restrictions: No      Mobility  Bed Mobility               General bed mobility comments: pt sitting EOB  Transfers Overall transfer level: Needs assistance   Transfers: Sit to/from Stand Sit to Stand: Min guard         General transfer comment: close guard for safety.   Ambulation/Gait Ambulation/Gait assistance: Min assist Ambulation Distance (Feet): 150 Feet Assistive device: None; (hallway handrail) Gait Pattern/deviations: Step-through pattern;Decreased stride length;Decreased step length - right;Decreased step length - left     General Gait Details: Began ambulation without an assistive device-pt was very unsteady with multiple instances of loss of balance. Once in hallway, had pt hold on to hallway handrail. Min guard assist with use of handrail. O2 sats 93% on RA, dyspnea 3/4.   Stairs            Wheelchair Mobility    Modified Rankin (Stroke Patients Only)       Balance Overall  balance assessment: Needs assistance;History of Falls           Standing balance-Leahy Scale: Fair                               Pertinent Vitals/Pain Pain Assessment: No/denies pain    Home Living Family/patient expects to be discharged to:: Private residence Living Arrangements: Alone Available Help at Discharge: Family;Available PRN/intermittently Type of Home: House Home Access: Stairs to enter   Entrance Stairs-Number of Steps: 1 Home Layout: Able to live on main level with bedroom/bathroom Home Equipment: Latina Craver - 2 wheels;Cane - single point;Wheelchair - manual      Prior Function Level of Independence: Independent with assistive device(s)         Comments: uses quad cane in community. "furniture walks" inside home     Hand Dominance        Extremity/Trunk Assessment   Upper Extremity Assessment Upper Extremity Assessment: Overall WFL for tasks assessed    Lower Extremity Assessment Lower Extremity Assessment: Generalized weakness (pt reports 'bad knees". Bil arthritis. )    Cervical / Trunk Assessment Cervical / Trunk Assessment: Kyphotic  Communication   Communication: HOH  Cognition Arousal/Alertness: Awake/alert Behavior During Therapy: WFL for tasks assessed/performed Overall Cognitive Status: Within Functional Limits for tasks assessed  General Comments      Exercises     Assessment/Plan    PT Assessment Patient needs continued PT services  PT Problem List Decreased mobility;Decreased activity tolerance;Decreased balance;Decreased knowledge of use of DME;Decreased strength       PT Treatment Interventions DME instruction;Gait training;Therapeutic activities;Therapeutic exercise;Patient/family education;Functional mobility training;Balance training    PT Goals (Current goals can be found in the Care Plan section)  Acute Rehab PT Goals Patient Stated  Goal: home. regain independence and become more active PT Goal Formulation: With patient Time For Goal Achievement: 06/01/16 Potential to Achieve Goals: Good    Frequency Min 3X/week   Barriers to discharge        Co-evaluation               End of Session Equipment Utilized During Treatment: Gait belt Activity Tolerance: Patient tolerated treatment well Patient left: in bed;with call bell/phone within reach   PT Visit Diagnosis: Muscle weakness (generalized) (M62.81);Difficulty in walking, not elsewhere classified (R26.2)    Time: 0998-3382 PT Time Calculation (min) (ACUTE ONLY): 31 min   Charges:   PT Evaluation $PT Eval Low Complexity: 1 Procedure PT Treatments $Gait Training: 8-22 mins   PT G Codes:          Weston Anna, MPT Pager: [phone]

## 2016-05-18 NOTE — Discharge Summary (Signed)
Joel [name], is a 83 y.o. male  DOB 1932-02-06  MRN [id number].  Admission date:  05/14/2016  Admitting Physician  Reubin Milan, MD  Discharge Date:  05/18/2016   Primary MD  Wenda Low, MD  Recommendations for primary care physician for things to follow:  - please check CBC, BMP to ensure stable H&H. - will need outpatient referral to [facility] for DBE  Admission Diagnosis  Gastrointestinal hemorrhage with melena [K92.1]   Discharge Diagnosis  Gastrointestinal hemorrhage with melena [K92.1]    Principal Problem:   UGI bleed Active Problems:   DM2 (diabetes mellitus, type 2) (HCC)   OSA (obstructive sleep apnea)   Paroxysmal atrial fibrillation (HCC)   Bipolar I disorder ([place])   Hypertension   HLD (hyperlipidemia)   Anemia due to chronic blood loss   COPD (chronic obstructive pulmonary disease) ([place])   GI bleed      Past Medical History:  Diagnosis Date  . Allergic rhinitis   . Anemia   . Anxiety   . Aortic stenosis 2012   mild to mod   . Atrial fibrillation ([place]) 1991   with multiple DCCV  . Bipolar affective disorder ([place])   . COPD (chronic obstructive pulmonary disease) ([place])   . Diabetic neuropathy ([place])    in feet  . Diabetic neuropathy ([place])   . Dyslipidemia   . Fatigue    last year or so  . Gout   . Hypertension   . Insomnia   . Obesity   . On home oxygen therapy 2 1/2 liters with bipap at night  . OSA (obstructive sleep apnea)    severe, uses bipap 16 1/2 by 12 or 13 setting  . Prostate cancer ([place])   . Rectal bleeding 12/16/2015  . Type 2 diabetes mellitus ([place])   . Vertigo     Past Surgical History:  Procedure Laterality Date  . CARDIOVERSION  yrs ago   prior to 1998  . COLONOSCOPY WITH PROPOFOL N/A 01/23/2013   Procedure: COLONOSCOPY WITH PROPOFOL;  Surgeon: Garlan Fair, MD;  Location: [facility] ENDOSCOPY;  Service: Endoscopy;  Laterality: N/A;  .  electro shock  1969   for depression  . ENTEROSCOPY Left 05/17/2016   Procedure: ENTEROSCOPY;  Surgeon: Wilford Corner, MD;  Location: [facility] ENDOSCOPY;  Service: Endoscopy;  Laterality: Left;  . ESOPHAGOGASTRODUODENOSCOPY N/A 03/03/2016   Procedure: ESOPHAGOGASTRODUODENOSCOPY (EGD);  Surgeon: Teena Irani, MD;  Location: [facility];  Service: Endoscopy;  Laterality: N/A;  . ESOPHAGOGASTRODUODENOSCOPY N/A 03/26/2016   Procedure: ESOPHAGOGASTRODUODENOSCOPY (EGD);  Surgeon: Teena Irani, MD;  Location: [facility] ENDOSCOPY;  Service: Endoscopy;  Laterality: N/A;  would like to use ultraslim scope  . ESOPHAGOGASTRODUODENOSCOPY (EGD) WITH PROPOFOL N/A 01/23/2013   Procedure: ESOPHAGOGASTRODUODENOSCOPY (EGD) WITH PROPOFOL;  Surgeon: Garlan Fair, MD;  Location: [facility] ENDOSCOPY;  Service: Endoscopy;  Laterality: N/A;  . ESOPHAGOGASTRODUODENOSCOPY (EGD) WITH PROPOFOL N/A 04/13/2016   Procedure: ESOPHAGOGASTRODUODENOSCOPY (EGD) WITH PROPOFOL;  Surgeon: Otis Brace, MD;  Location: [facility];  Service: Gastroenterology;  Laterality: N/A;  .  GIVENS CAPSULE STUDY N/A 03/03/2016   Procedure: GIVENS CAPSULE STUDY;  Surgeon: Teena Irani, MD;  Location: [facility] ENDOSCOPY;  Service: Endoscopy;  Laterality: N/A;  . HEMORRHOID SURGERY N/A 12/16/2015   Procedure: PROCTOSCOPY WITH CONTROL OF BLEEDING;  Surgeon: Fanny Skates, MD;  Location: [facility];  Service: General;  Laterality: N/A;  . KNEE SURGERY  1970's   rt  . MAZE  1998  . PROSTATE BIOPSY    . SHOULDER SURGERY  7-8 yrs ago   rt       History of present illness and  Hospital Course:     Kindly see H&P for history of present illness and admission details, please review complete Labs, Consult reports and Test reports for all details in brief  HPI  from the history and physical done on the day of admission 05/14/2016  HPI: Joel [name] is a 83 y.o. male with medical history significant of allergic rhinitis, anemia, anxiety, aortic stenosis, chronic atrial  fibrillation, bipolar affective disorder, COPD on home oxygen, diabetic neuropathy, hyperlipidemia, gout, hypertension, prostate cancer, type 2 diabetes who was referred by the cancer center to the emergency department after the patient has been feeling progressively worse with fatigue, dyspnea, dizziness and reports very melanotic stools for the past two days. He saw his gastroenterologist yesterday who He denies chest pain, abdominal pain, nausea, emesis, diarrhea or constipation. He denies frequency or dysuria.  ED Course: The patient's fecal occult blood was positive. WBC 2.5, hemoglobin 7.7 mg/dL and platelets 120. His CMP showed a glucose of 145 mg/dL, otherwise is within normal limits. He was given a 80 mg IVP bolus and then started on a Protonix infusion.   Hospital Course   83 y.o.malewith medical history significant for atrial fibrillation currently not on anticoagulation, diabetes, pulmonary hypertension in thesetting of COPD/sleep apnea and obesity hypoventilation syndrome, aortic stenosis, dyslipidemia and bipolar disorder. Since November 2017 patient has been admitted for recurrent GI bleeding symptoms (melena)with associated symptomatic anemia. Recurrent overt obscure GI bleeding, melena, prior endoscopy x 2 and capsule endoscopy. Gastric avm recently cauterized in March and capsule in January showed blood in proximal small bowel, and went for enteroscopy 4/16, which was normal enteroscopy up to jejunum.  Recurrent GI bleeding -Patient has been hospitalized  8 times since November 2017 secondary to melena/GI bleeding . - Status of workup in the past including capsule endoscopy in February revealed likely jejunal source but was unable to be located by enteroscopy, EGD on 3/13 with finding of AVM in gastric body treated with APC - GI Input greatly appreciated, he went for enteroscopy 4/16, which was normal up to jejunum(extended to probably proximal to mid jejunum), per GI  If  rebleeding occurs then needs referral to a tertiary care center for a double balloon enteroscopy (DBE) but no bleeding now and will need outpatient referral to [facility] for DBE, Suspect distal small bowel AVMs as source of the obscure GI bleeding. - Hemoglobin has been stable since initial transfusion, patient tolerating soft diet, so will be discharged today with close follow-up with GI and PCP regarding repeat labs and outpatient referral for DBE.   Anemia due to acute  blood loss - Secondary to above,transfused 2 units PRBC 4/14, hemoglobin has been remained stable since, continue with iron supplement on discharge   DM2  - cont home medication  Chronic respiratory failure - At baseline, he is on 2 L nasal cannula as needed, at baseline,   Paroxysmal atrial fibrillation  -Has not been  on anticoagulation since early February 2018 when Coumadin was stopped  Essential Hypertension - Continue with home medication  OSA (obstructive sleep apnea) - Nocturnal CPAP  COPD mixed type/Pulmonary hypertension - Currently asymptomatic  Prostate cancer - Has received 1 dose of Lupron at urology office with doses separated by 6 month intervals - on  radiation therapy, discharged today after receiving his radiation therapy treatment  Bipolar I disorder  -ContinueCelexa and Wellbutrin -Restoril at HS    Discharge Condition:  Stable   Follow UP  Con[place]augh Lakes, MD Follow up in 1 week(s).   Specialty:  Internal Medicine Contact information: [address]  [place] [address] [phone]        HAYES,JOHN C, MD Follow up in 10 day(s).   Specialty:  Gastroenterology Contact information: [address] [place] [address] [phone]             Discharge Instructions  and  Discharge Medications     Discharge Instructions    Diet - low sodium heart healthy    Complete by:  As directed    Increase  activity slowly    Complete by:  As directed      Allergies as of 05/18/2016      Reactions   Penicillins Anaphylaxis   Has patient had a PCN reaction causing immediate rash, facial/tongue/throat swelling, SOB or lightheadedness with hypotension: unknown Has patient had a PCN reaction causing severe rash involving mucus membranes or skin necrosis: unknown Has patient had a PCN reaction that required hospitalization: unknown Has patient had a PCN reaction occurring within the last 10 years: no If all of the above answers are "NO", then may proceed with Cephalosporin use.   Antihistamines, Loratadine-type Other (See Comments)   depression   Other    Beta blockers-Novacaine  Caused depression      Medication List    STOP taking these medications   aspirin EC 325 MG tablet   diclofenac sodium 1 % Gel Commonly known as:  VOLTAREN     TAKE these medications   allopurinol 100 MG tablet Commonly known as:  ZYLOPRIM Take 100 mg by mouth daily.   buPROPion 150 MG 24 hr tablet Commonly known as:  WELLBUTRIN XL Take 1 tablet (150 mg total) by mouth daily.   CARTIA XT 120 MG 24 hr capsule Generic drug:  diltiazem Take 120 mg by mouth at bedtime.   citalopram 40 MG tablet Commonly known as:  CELEXA Take 40 mg by mouth daily.   dextromethorphan-guaiFENesin 30-600 MG 12hr tablet Commonly known as:  MUCINEX DM Take 1 tablet by mouth 2 (two) times daily.   fluticasone 50 MCG/ACT nasal spray Commonly known as:  FLONASE Place 2 sprays into both nostrils daily. What changed:  when to take this  reasons to take this   furosemide 80 MG tablet Commonly known as:  LASIX Take 80 mg by mouth daily.   iron polysaccharides 150 MG capsule Commonly known as:  NIFEREX Take 1 capsule (150 mg total) by mouth 2 (two) times daily. What changed:  when to take this   lovastatin 20 MG tablet Commonly known as:  MEVACOR Take 20 mg by mouth at bedtime.   meclizine 25 MG tablet Commonly  known as:  ANTIVERT Take 1 tablet (25 mg total) by mouth 3 (three) times daily as needed for dizziness. What changed:  when to take this   metFORMIN 1000 MG tablet Commonly known  as:  GLUCOPHAGE Take 1,000 mg by mouth 2 (two) times daily.   OXYGEN Inhale 2 L into the lungs as needed (shortness of breath).   pantoprazole 40 MG tablet Commonly known as:  PROTONIX Take 40 mg by mouth daily.   potassium chloride SA 20 MEQ tablet Commonly known as:  K-DUR,KLOR-CON Take 20 mEq by mouth daily.   sitaGLIPtin 100 MG tablet Commonly known as:  JANUVIA Take 100 mg by mouth daily.   temazepam 15 MG capsule Commonly known as:  RESTORIL Take 15 mg by mouth at bedtime.   valsartan 160 MG tablet Commonly known as:  DIOVAN Take 160 mg by mouth daily.   VISION FORMULA PO Take 1 tablet by mouth 2 (two) times daily.         Diet and Activity recommendation: See Discharge Instructions above   Consults obtained -  GI   Major procedures and Radiology Reports - PLEASE review detailed and final reports for all details, in brief -  Enteroscopy 4/17 by Dr. Michail Sermon - The examined portion of the jejunum was normal. - Normal duodenal bulb, second portion of the duodenum, third portion of the duodenum and fourth portion of the duodenum. - Small hiatal hernia. - Normal esophagus. - No specimens collected. - Suspect small bowel AVMs out of the reach of the enteroscope as the source of his obscure GI bleeding   Dg Chest 2 View  Result Date: 05/16/2016 CLINICAL DATA:  Acute shortness of breath. EXAM: CHEST  2 VIEW COMPARISON:  01/27/2016 and prior radiographs FINDINGS: Cardiomegaly with pulmonary vascular congestion noted. Mild interstitial opacities may represent mild interstitial edema. Small bilateral pleural effusions are noted. There is no evidence of pneumothorax. No other changes noted. Median sternotomy wires are identified. IMPRESSION: Cardiomegaly with pulmonary vascular congestion  and probable mild interstitial pulmonary edema. Small bilateral pleural effusions. Electronically Signed   By: Margarette Canada M.D.   On: 05/16/2016 14:42    Micro Results     No results found for this or any previous visit (from the past 240 hour(s)).     Today   Subjective:   Joel [name] today has no headache,no chest or abdominal pain ,feels much better wants to go home today.   Objective:   Blood pressure 131/62, pulse 70, temperature 98.4 F (36.9 C), temperature source Oral, resp. rate 18, height 6' (1.829 m), weight 107 kg (236 lb), SpO2 97 %.   Intake/Output Summary (Last 24 hours) at 05/18/16 1218 Last data filed at 05/18/16 0801  Gross per 24 hour  Intake           886.67 ml  Output                0 ml  Net           886.67 ml    Exam Awake Alert, Oriented X 3,  Supple Neck,No JVD,  Symmetrical Chest wall movement, Good air movement bilaterally, CTAB irr irr,No Gallops,Rubs , No Parasternal Heave +ve B.Sounds, Abd Soft, No tenderness, No rebound - guarding or rigidity. No Cyanosis, Clubbing , +1 edema, No new Rash or bruise    Data Review   CBC w Diff: Lab Results  Component Value Date   WBC 2.4 (L) 05/18/2016   HGB 8.6 (L) 05/18/2016   HCT 27.6 (L) 05/18/2016   PLT 113 (L) 05/18/2016   LYMPHOPCT 15 04/12/2016   MONOPCT 12 04/12/2016   EOSPCT 4 04/12/2016   BASOPCT 1 04/12/2016  CMP: Lab Results  Component Value Date   NA 137 05/18/2016   K 3.5 05/18/2016   CL 104 05/18/2016   CO2 26 05/18/2016   BUN 10 05/18/2016   CREATININE 1.05 05/18/2016   PROT 7.4 05/14/2016   ALBUMIN 4.2 05/14/2016   BILITOT 0.4 05/14/2016   ALKPHOS 77 05/14/2016   AST 30 05/14/2016   ALT 22 05/14/2016  .   Total Time in preparing paper work, data evaluation and todays exam - 35 minutes  Taeko Schaffer M.D on 05/18/2016 at 12:18 PM  [facility]   Office  [phone]

## 2016-05-18 NOTE — Discharge Instructions (Signed)
Follow with Primary MD Wenda Low, MD in 7 days   Get CBC, CMP,  checked  by Primary MD next visit.    Activity: As tolerated with Full fall precautions use walker/cane & assistance as needed   Disposition Home    Diet: Heart Healthy  , with feeding assistance and aspiration precautions.  For Heart failure patients - Check your Weight same time everyday, if you gain over 2 pounds, or you develop in leg swelling, experience more shortness of breath or chest pain, call your Primary MD immediately. Follow Cardiac Low Salt Diet and 1.5 lit/day fluid restriction.   On your next visit with your primary care physician please Get Medicines reviewed and adjusted.   Please request your Prim.MD to go over all Hospital Tests and Procedure/Radiological results at the follow up, please get all Hospital records sent to your Prim MD by signing hospital release before you go home.   If you experience worsening of your admission symptoms, develop shortness of breath, life threatening emergency, suicidal or homicidal thoughts you must seek medical attention immediately by calling 911 or calling your MD immediately  if symptoms less severe.  You Must read complete instructions/literature along with all the possible adverse reactions/side effects for all the Medicines you take and that have been prescribed to you. Take any new Medicines after you have completely understood and accpet all the possible adverse reactions/side effects.   Do not drive, operating heavy machinery, perform activities at heights, swimming or participation in water activities or provide baby sitting services if your were admitted for syncope or siezures until you have seen by Primary MD or a Neurologist and advised to do so again.  Do not drive when taking Pain medications.    Do not take more than prescribed Pain, Sleep and Anxiety Medications  Special Instructions: If you have smoked or chewed Tobacco  in the last 2 yrs  please stop smoking, stop any regular Alcohol  and or any Recreational drug use.  Wear Seat belts while driving.   Please note  You were cared for by a hospitalist during your hospital stay. If you have any questions about your discharge medications or the care you received while you were in the hospital after you are discharged, you can call the unit and asked to speak with the hospitalist on call if the hospitalist that took care of you is not available. Once you are discharged, your primary care physician will handle any further medical issues. Please note that NO REFILLS for any discharge medications will be authorized once you are discharged, as it is imperative that you return to your primary care physician (or establish a relationship with a primary care physician if you do not have one) for your aftercare needs so that they can reassess your need for medications and monitor your lab values.

## 2016-05-19 ENCOUNTER — Ambulatory Visit
Admission: RE | Admit: 2016-05-19 | Discharge: 2016-05-19 | Disposition: A | Discharge status: Home / Self Care | Payer: PPO | Source: Ambulatory Visit | Attending: Radiation Oncology | Admitting: Radiation Oncology

## 2016-05-19 DIAGNOSIS — Z510 Encounter for antineoplastic radiation therapy: Secondary | ICD-10-CM | POA: Diagnosis not present

## 2016-05-19 DIAGNOSIS — C61 Malignant neoplasm of prostate: Secondary | ICD-10-CM | POA: Diagnosis not present

## 2016-05-20 ENCOUNTER — Other Ambulatory Visit: Payer: Self-pay | Admitting: *Deleted

## 2016-05-20 ENCOUNTER — Ambulatory Visit
Admission: RE | Admit: 2016-05-20 | Discharge: 2016-05-20 | Disposition: A | Discharge status: Home / Self Care | Payer: PPO | Source: Ambulatory Visit | Attending: Radiation Oncology | Admitting: Radiation Oncology

## 2016-05-20 DIAGNOSIS — C61 Malignant neoplasm of prostate: Secondary | ICD-10-CM | POA: Diagnosis not present

## 2016-05-20 DIAGNOSIS — Z510 Encounter for antineoplastic radiation therapy: Secondary | ICD-10-CM | POA: Diagnosis not present

## 2016-05-20 NOTE — Patient Outreach (Addendum)
[facility]) Care Management  05/20/2016  ZED WANNINGER [birth year] [id number]   Pt currently active with this RN case manager with community home visits however recent had a hospitalization and RN will attempt a transition of care call today based upon a discharged on 4/17.   RN spoke with pt who indicated this was not a good time to call and requested a call back. Offered tomorrow however pt has daily radiation and another appointment tomorrow. RN offered to follow up on Monday later in the afternoon around 4:00 pm as pt very receptive and indicated he was doing "okay". RN will follow up next Monday as requested and attempt to completed the transition of care template based upon his recent discharged. Unable to review medications via discharge orders. Will further discussed a new plan of care and goals on the conversation on next Monday concerning his ongoing needs. No other request or inquires at this time.  Raina Mina, RN Care Management Coordinator [place] Office [phone]

## 2016-05-21 ENCOUNTER — Ambulatory Visit
Admission: RE | Admit: 2016-05-21 | Discharge: 2016-05-21 | Disposition: A | Discharge status: Home / Self Care | Payer: PPO | Source: Ambulatory Visit | Attending: Radiation Oncology | Admitting: Radiation Oncology

## 2016-05-21 DIAGNOSIS — C61 Malignant neoplasm of prostate: Secondary | ICD-10-CM | POA: Diagnosis not present

## 2016-05-21 DIAGNOSIS — Z510 Encounter for antineoplastic radiation therapy: Secondary | ICD-10-CM | POA: Diagnosis not present

## 2016-05-24 ENCOUNTER — Other Ambulatory Visit: Payer: Self-pay | Admitting: *Deleted

## 2016-05-24 ENCOUNTER — Ambulatory Visit
Admission: RE | Admit: 2016-05-24 | Discharge: 2016-05-24 | Disposition: A | Discharge status: Home / Self Care | Payer: PPO | Source: Ambulatory Visit | Attending: Radiation Oncology | Admitting: Radiation Oncology

## 2016-05-24 DIAGNOSIS — C61 Malignant neoplasm of prostate: Secondary | ICD-10-CM | POA: Diagnosis not present

## 2016-05-24 DIAGNOSIS — Z510 Encounter for antineoplastic radiation therapy: Secondary | ICD-10-CM | POA: Diagnosis not present

## 2016-05-24 NOTE — Patient Outreach (Addendum)
[facility] Care Management  05/24/2016  [name] 1932/04/29 [id number]   RN spoke with pt today who indicates he is having some side effects from the ongoing radiation treatments but his provider is aware. States some diarrhea that he is treating with Imodium and being very tired after treatment as he continues to take Vitamins from more energy. Pt indicates he was told this was normal and his tolerance was good at this time. Pt states his schedule is very full with his treatments and provider office visits. States he will follow up with his primary provider on tomorrow. Pt was able to verify his medications and RN able to completed the transition of care template based upon the short conversations earlier as RN unable to gather this information.  RN encourage pt to continue managing his care and seek medical attention with acute symptoms. Pt very grateful for the follow up calls and receptive to another transition of care call next week however did not wish to schedule a home visit based upon his unpredictable schedule for the upcoming next. Will schedule accordingly for ongoing transition of care contacts.  Patient was recently discharged from hospital and all medications have been reviewed.  Joel Mina, RN Care Management Coordinator [place] Office [phone]

## 2016-05-25 ENCOUNTER — Ambulatory Visit
Admission: RE | Admit: 2016-05-25 | Discharge: 2016-05-25 | Disposition: A | Discharge status: Home / Self Care | Payer: PPO | Source: Ambulatory Visit | Attending: Radiation Oncology | Admitting: Radiation Oncology

## 2016-05-25 DIAGNOSIS — G4733 Obstructive sleep apnea (adult) (pediatric): Secondary | ICD-10-CM | POA: Diagnosis not present

## 2016-05-25 DIAGNOSIS — N183 Chronic kidney disease, stage 3 (moderate): Secondary | ICD-10-CM | POA: Diagnosis not present

## 2016-05-25 DIAGNOSIS — F3341 Major depressive disorder, recurrent, in partial remission: Secondary | ICD-10-CM | POA: Diagnosis not present

## 2016-05-25 DIAGNOSIS — I482 Chronic atrial fibrillation: Secondary | ICD-10-CM | POA: Diagnosis not present

## 2016-05-25 DIAGNOSIS — Z510 Encounter for antineoplastic radiation therapy: Secondary | ICD-10-CM | POA: Diagnosis not present

## 2016-05-25 DIAGNOSIS — D509 Iron deficiency anemia, unspecified: Secondary | ICD-10-CM | POA: Diagnosis not present

## 2016-05-25 DIAGNOSIS — C61 Malignant neoplasm of prostate: Secondary | ICD-10-CM | POA: Diagnosis not present

## 2016-05-25 DIAGNOSIS — I10 Essential (primary) hypertension: Secondary | ICD-10-CM | POA: Diagnosis not present

## 2016-05-25 DIAGNOSIS — E1142 Type 2 diabetes mellitus with diabetic polyneuropathy: Secondary | ICD-10-CM | POA: Diagnosis not present

## 2016-05-25 DIAGNOSIS — K922 Gastrointestinal hemorrhage, unspecified: Secondary | ICD-10-CM | POA: Diagnosis not present

## 2016-05-26 ENCOUNTER — Ambulatory Visit
Admission: RE | Admit: 2016-05-26 | Discharge: 2016-05-26 | Disposition: A | Discharge status: Home / Self Care | Payer: PPO | Source: Ambulatory Visit | Attending: Radiation Oncology | Admitting: Radiation Oncology

## 2016-05-26 DIAGNOSIS — Z510 Encounter for antineoplastic radiation therapy: Secondary | ICD-10-CM | POA: Diagnosis not present

## 2016-05-26 DIAGNOSIS — C61 Malignant neoplasm of prostate: Secondary | ICD-10-CM | POA: Diagnosis not present

## 2016-05-27 ENCOUNTER — Ambulatory Visit
Admission: RE | Admit: 2016-05-27 | Discharge: 2016-05-27 | Disposition: A | Discharge status: Home / Self Care | Payer: PPO | Source: Ambulatory Visit | Attending: Radiation Oncology | Admitting: Radiation Oncology

## 2016-05-27 DIAGNOSIS — Z510 Encounter for antineoplastic radiation therapy: Secondary | ICD-10-CM | POA: Diagnosis not present

## 2016-05-27 DIAGNOSIS — C61 Malignant neoplasm of prostate: Secondary | ICD-10-CM | POA: Diagnosis not present

## 2016-05-28 ENCOUNTER — Ambulatory Visit
Admission: RE | Admit: 2016-05-28 | Discharge: 2016-05-28 | Disposition: A | Discharge status: Home / Self Care | Payer: PPO | Source: Ambulatory Visit | Attending: Radiation Oncology | Admitting: Radiation Oncology

## 2016-05-28 DIAGNOSIS — C61 Malignant neoplasm of prostate: Secondary | ICD-10-CM | POA: Diagnosis not present

## 2016-05-28 DIAGNOSIS — Z510 Encounter for antineoplastic radiation therapy: Secondary | ICD-10-CM | POA: Diagnosis not present

## 2016-05-31 ENCOUNTER — Ambulatory Visit
Admission: RE | Admit: 2016-05-31 | Discharge: 2016-05-31 | Disposition: A | Discharge status: Home / Self Care | Payer: PPO | Source: Ambulatory Visit | Attending: Radiation Oncology | Admitting: Radiation Oncology

## 2016-05-31 ENCOUNTER — Other Ambulatory Visit: Payer: Self-pay | Admitting: *Deleted

## 2016-05-31 DIAGNOSIS — C61 Malignant neoplasm of prostate: Secondary | ICD-10-CM | POA: Diagnosis not present

## 2016-05-31 DIAGNOSIS — Z510 Encounter for antineoplastic radiation therapy: Secondary | ICD-10-CM | POA: Diagnosis not present

## 2016-05-31 NOTE — Patient Outreach (Signed)
[facility]) Care Management  05/31/2016  Joel [name] 1933/01/10 [id number]   RN attempted transition of care call today however unsuccessful. RN was not able to leave a message due to the mailbox was full. RN will rescheduled another outreach call accordingly for ongoing transition of care and attempt to reschedule the missed appointment from the last home visit.  Raina Mina, RN Care Management Coordinator [place] Office [phone]

## 2016-06-01 ENCOUNTER — Ambulatory Visit
Admission: RE | Admit: 2016-06-01 | Discharge: 2016-06-01 | Disposition: A | Discharge status: Home / Self Care | Payer: PPO | Source: Ambulatory Visit | Attending: Radiation Oncology | Admitting: Radiation Oncology

## 2016-06-01 DIAGNOSIS — C61 Malignant neoplasm of prostate: Secondary | ICD-10-CM | POA: Diagnosis not present

## 2016-06-01 DIAGNOSIS — Z510 Encounter for antineoplastic radiation therapy: Secondary | ICD-10-CM | POA: Diagnosis not present

## 2016-06-02 ENCOUNTER — Ambulatory Visit
Admission: RE | Admit: 2016-06-02 | Discharge: 2016-06-02 | Disposition: A | Discharge status: Home / Self Care | Payer: PPO | Source: Ambulatory Visit | Attending: Radiation Oncology | Admitting: Radiation Oncology

## 2016-06-02 DIAGNOSIS — C61 Malignant neoplasm of prostate: Secondary | ICD-10-CM | POA: Diagnosis not present

## 2016-06-02 DIAGNOSIS — Z510 Encounter for antineoplastic radiation therapy: Secondary | ICD-10-CM | POA: Diagnosis not present

## 2016-06-03 ENCOUNTER — Ambulatory Visit
Admission: RE | Admit: 2016-06-03 | Discharge: 2016-06-03 | Disposition: A | Discharge status: Home / Self Care | Payer: PPO | Source: Ambulatory Visit | Attending: Radiation Oncology | Admitting: Radiation Oncology

## 2016-06-03 ENCOUNTER — Other Ambulatory Visit: Payer: Self-pay | Admitting: *Deleted

## 2016-06-03 DIAGNOSIS — Z510 Encounter for antineoplastic radiation therapy: Secondary | ICD-10-CM | POA: Diagnosis not present

## 2016-06-03 DIAGNOSIS — C61 Malignant neoplasm of prostate: Secondary | ICD-10-CM | POA: Diagnosis not present

## 2016-06-03 NOTE — Patient Outreach (Signed)
[facility]) Care Management  06/03/2016  LEIBY PIGEON 08-Feb-1932 [id number]   RN attempted outreach call however pt's mailbox is full and RN unable to leave a message. Will rescheduled ongoing transition of care call and follow up next week. Note pt continues to receive radiation appointments throughout the week sometimes on only office visits.   Raina Mina, RN Care Management Coordinator [place] Office [phone]

## 2016-06-04 ENCOUNTER — Ambulatory Visit
Admission: RE | Admit: 2016-06-04 | Discharge: 2016-06-04 | Disposition: A | Discharge status: Home / Self Care | Payer: PPO | Source: Ambulatory Visit | Attending: Radiation Oncology | Admitting: Radiation Oncology

## 2016-06-04 DIAGNOSIS — C61 Malignant neoplasm of prostate: Secondary | ICD-10-CM | POA: Diagnosis not present

## 2016-06-04 DIAGNOSIS — Z510 Encounter for antineoplastic radiation therapy: Secondary | ICD-10-CM | POA: Diagnosis not present

## 2016-06-07 ENCOUNTER — Other Ambulatory Visit: Payer: Self-pay | Admitting: *Deleted

## 2016-06-07 ENCOUNTER — Ambulatory Visit: Payer: PPO

## 2016-06-07 ENCOUNTER — Ambulatory Visit
Admission: RE | Admit: 2016-06-07 | Discharge: 2016-06-07 | Disposition: A | Discharge status: Home / Self Care | Payer: PPO | Source: Ambulatory Visit | Attending: Radiation Oncology | Admitting: Radiation Oncology

## 2016-06-07 DIAGNOSIS — C61 Malignant neoplasm of prostate: Secondary | ICD-10-CM | POA: Diagnosis not present

## 2016-06-07 DIAGNOSIS — Z510 Encounter for antineoplastic radiation therapy: Secondary | ICD-10-CM | POA: Diagnosis not present

## 2016-06-07 NOTE — Patient Outreach (Signed)
[facility]) Care Management  06/07/2016  Joel [name] 1932/05/13 [id number]   RN attempted outreach call today however unsuccessful and unable to leave a message to request a call back. Will continue transition of care calls and schedule the next follow up call.   Raina Mina, RN Care Management Coordinator [name] Office [phone]

## 2016-06-08 ENCOUNTER — Ambulatory Visit
Admission: RE | Admit: 2016-06-08 | Discharge: 2016-06-08 | Disposition: A | Discharge status: Home / Self Care | Payer: PPO | Source: Ambulatory Visit | Attending: Radiation Oncology | Admitting: Radiation Oncology

## 2016-06-08 DIAGNOSIS — Z510 Encounter for antineoplastic radiation therapy: Secondary | ICD-10-CM | POA: Diagnosis not present

## 2016-06-08 DIAGNOSIS — C61 Malignant neoplasm of prostate: Secondary | ICD-10-CM | POA: Diagnosis not present

## 2016-06-09 ENCOUNTER — Ambulatory Visit
Admission: RE | Admit: 2016-06-09 | Discharge: 2016-06-09 | Disposition: A | Discharge status: Home / Self Care | Payer: PPO | Source: Ambulatory Visit | Attending: Radiation Oncology | Admitting: Radiation Oncology

## 2016-06-09 DIAGNOSIS — C61 Malignant neoplasm of prostate: Secondary | ICD-10-CM | POA: Diagnosis not present

## 2016-06-09 DIAGNOSIS — Z510 Encounter for antineoplastic radiation therapy: Secondary | ICD-10-CM | POA: Diagnosis not present

## 2016-06-10 ENCOUNTER — Ambulatory Visit
Admission: RE | Admit: 2016-06-10 | Discharge: 2016-06-10 | Disposition: A | Discharge status: Home / Self Care | Payer: PPO | Source: Ambulatory Visit | Attending: Radiation Oncology | Admitting: Radiation Oncology

## 2016-06-10 DIAGNOSIS — C61 Malignant neoplasm of prostate: Secondary | ICD-10-CM | POA: Diagnosis not present

## 2016-06-10 DIAGNOSIS — Z510 Encounter for antineoplastic radiation therapy: Secondary | ICD-10-CM | POA: Diagnosis not present

## 2016-06-11 ENCOUNTER — Ambulatory Visit
Admission: RE | Admit: 2016-06-11 | Discharge: 2016-06-11 | Disposition: A | Discharge status: Home / Self Care | Payer: PPO | Source: Ambulatory Visit | Attending: Radiation Oncology | Admitting: Radiation Oncology

## 2016-06-11 DIAGNOSIS — Z510 Encounter for antineoplastic radiation therapy: Secondary | ICD-10-CM | POA: Diagnosis not present

## 2016-06-11 DIAGNOSIS — C61 Malignant neoplasm of prostate: Secondary | ICD-10-CM | POA: Diagnosis not present

## 2016-06-14 ENCOUNTER — Ambulatory Visit: Payer: Self-pay | Admitting: *Deleted

## 2016-06-15 DIAGNOSIS — G4733 Obstructive sleep apnea (adult) (pediatric): Secondary | ICD-10-CM | POA: Diagnosis not present

## 2016-06-15 DIAGNOSIS — J410 Simple chronic bronchitis: Secondary | ICD-10-CM | POA: Diagnosis not present

## 2016-06-15 DIAGNOSIS — E662 Morbid (severe) obesity with alveolar hypoventilation: Secondary | ICD-10-CM | POA: Diagnosis not present

## 2016-06-16 ENCOUNTER — Encounter: Payer: Self-pay | Admitting: Radiation Oncology

## 2016-06-16 NOTE — Progress Notes (Signed)
  Radiation Oncology         [phone] ________________________________  Name: Joel [name] MRN: [id number]  Date: 06/16/2016  DOB: 1932/06/22  End of Treatment Note  Diagnosis:   Stage T2a adenocarcinoma of the prostate with a Gleason's score of 4+4 and a PSA of 9.90    Indication for treatment:  Curative, Definitive Radiotherapy       Radiation treatment dates:   04/19/16 - 06/11/16  Site/dose:  1. The prostate, seminal vesicles, and pelvic lymph nodes were initially treated to 45 Gy in 25 fractions of 1.8 Gy  2. The prostate only was boosted to 75 Gy with 15 additional fractions of 2.0 Gy   Beams/energy:  1. The prostate, seminal vesicles, and pelvic lymph nodes were initially treated using helical intensity modulated radiotherapy delivering 6 megavolt photons. Image guidance was performed with megavoltage CT studies prior to each fraction. He was immobilized with a body fix lower extremity mold.  2. the prostate only was boosted using helical intensity modulated radiotherapy delivering 6 megavolt photons. Image guidance was performed with megavoltage CT studies prior to each fraction. He was immobilized with a body fix lower extremity mold.  Narrative: The patient tolerated radiation treatment relatively well.   The patient experienced some minor urinary irritation including urinary urgency and some incontinence.  He did experience a brief interruption in treatment and delay in his initial start due to GI bleed issues.  However, he was able to complete treatment without any clinical impact of this unrelated issue.  Overall the patient was without complaint.  Plan: The patient has completed radiation treatment. He will return to radiation oncology clinic for routine followup in one month. I advised him to call or return sooner if he has any questions or concerns related to his recovery or treatment. ________________________________  Sheral Apley. Tammi Klippel, M.D.  This document serves as  a record of services personally performed by Tyler Pita, MD. It was created on his behalf by Maryla Morrow, a trained medical scribe. The creation of this record is based on the scribe's personal observations and the provider's statements to them. This document has been checked and approved by the attending provider.

## 2016-06-17 ENCOUNTER — Other Ambulatory Visit: Payer: Self-pay | Admitting: *Deleted

## 2016-06-17 ENCOUNTER — Ambulatory Visit: Payer: Self-pay | Admitting: *Deleted

## 2016-06-17 NOTE — Patient Outreach (Signed)
[facility]) Care Management  06/16/2016  Joel [name] [birth year] [id number]   RN attempt outreach contact for ongoing transition of care. Pt has mentioned on the last call that he would prefer telephone case management due to his scheduled treatment visits. RN able to leave a HIPAA approved voice message requesting a call back. Will continue to follow up accordingly to evaluate pt's ongoing adherence with his plan of care. Will rescheduled another call back   Raina Mina, RN Care Management Coordinator [place] Office [phone] tomorrow.

## 2016-06-17 NOTE — Patient Outreach (Signed)
[facility]) Care Management  06/17/2016  KAEDEN MESTER 17-Aug-1932 [id number]   RN attempted another outreach call today however mailbox was full and RN unable to leave a message. RN will continue follow up calls accordingly concerning ongoing transition of care and inquire on pt's ongoing treatment therapy. Will review plan of care and goals at that time. Will reschedule another follow up call for next week for ongoing transition of care. Note able to leave a message to pt's phone on yesterday however no as successful today.   Raina Mina, RN Care Management Coordinator [place] Office [phone]

## 2016-06-21 ENCOUNTER — Other Ambulatory Visit: Payer: Self-pay | Admitting: *Deleted

## 2016-06-21 ENCOUNTER — Encounter: Payer: Self-pay | Admitting: *Deleted

## 2016-06-21 NOTE — Patient Outreach (Signed)
[facility]) Care Management  06/21/2016  Joel [name] 10/30/1932 [id number]   RN received a call back from pt today after earlier message. Pt reports he is doing very well with no symptoms of weakness and finished his radiation on last Friday and will have a follow up appointment for his H/H level. Pt feels his levels are good with no signs or weakness. Denies any follows or issues as pt verified his medications remains the same with no encountered issues. Plan of care discussed with all met goals as pt feels he is well enough for a telephonic discharge today. Pt states he has RN contact number if other needs arise in the future. Pt very grateful for the services that have been provided. Pt is aware that RN will notify his primary provider that he will be discharged from the [facility] program and services with all goals met. RN continued to encouraged use of his assisted device and precautionary measures during his ambulation to avoid risk of falls. Case will be closed.  Raina Mina, RN Care Management Coordinator [place] Office [phone]

## 2016-06-21 NOTE — Patient Outreach (Signed)
[facility]) Care Management  06/21/2016  Joel [name] 12/22/1932 [id number]   RN attempted outreach however pt not available. RN able to leave a HPAA approved voice message requesting a call back. Will further inquire at that time on pt's ongoing treatment. Will rescheduled another follow up call this week.  Raina Mina, RN Care Management Coordinator [place] Office [phone]

## 2016-06-22 DIAGNOSIS — Q2733 Arteriovenous malformation of digestive system vessel: Secondary | ICD-10-CM | POA: Diagnosis not present

## 2016-06-22 DIAGNOSIS — Z7901 Long term (current) use of anticoagulants: Secondary | ICD-10-CM | POA: Diagnosis not present

## 2016-06-22 DIAGNOSIS — D509 Iron deficiency anemia, unspecified: Secondary | ICD-10-CM | POA: Diagnosis not present

## 2016-06-22 DIAGNOSIS — K625 Hemorrhage of anus and rectum: Secondary | ICD-10-CM | POA: Diagnosis not present

## 2016-06-24 DIAGNOSIS — Z7984 Long term (current) use of oral hypoglycemic drugs: Secondary | ICD-10-CM | POA: Diagnosis not present

## 2016-06-24 DIAGNOSIS — Z6831 Body mass index (BMI) 31.0-31.9, adult: Secondary | ICD-10-CM | POA: Diagnosis not present

## 2016-06-24 DIAGNOSIS — Z6832 Body mass index (BMI) 32.0-32.9, adult: Secondary | ICD-10-CM | POA: Diagnosis not present

## 2016-06-24 DIAGNOSIS — E1142 Type 2 diabetes mellitus with diabetic polyneuropathy: Secondary | ICD-10-CM | POA: Diagnosis not present

## 2016-06-24 DIAGNOSIS — D509 Iron deficiency anemia, unspecified: Secondary | ICD-10-CM | POA: Diagnosis not present

## 2016-07-13 DIAGNOSIS — D509 Iron deficiency anemia, unspecified: Secondary | ICD-10-CM | POA: Diagnosis not present

## 2016-07-13 NOTE — Progress Notes (Signed)
Radiation Oncology         [phone] ________________________________  Name: Joel [name] MRN: [id number]  Date: 07/14/2016  DOB: 04/09/1932  Post Treatment Note  CC: Wenda Low, MD  Kathie Rhodes, MD  Diagnosis:   Stage T2a adenocarcinoma of the prostate with a Gleason's score of 4+4 and a PSA of 9.90.    Interval Since Last Radiation:  4.5  weeks  04/19/16 - 06/11/16:  1. The prostate, seminal vesicles, and pelvic lymph nodes were initially treated to 45 Gy in 25 fractions of 1.8 Gy  2. The prostate only was boosted to 75 Gy with 15 additional fractions of 2.0 Gy   Narrative:  The patient returns today for routine follow-up.  He tolerated radiotherapy relatively well. He did experience some minor urinary irritation including urgency with occasional incontinence. He did experience a brief interruption of treatment and delay in his initial start due to GI bleeding issues. However, he was able to complete treatment without any clinical impact of this unrelated issue.                          On review of systems, the patient states that he is doing well overall. He continues with some increased urgency and rare urge incontinence. He has chronic increased urinary frequency due to the need for Lasix daily. He denies dysuria, gross hematuria, flank pain, fever or chills. He reports regular bowel movements daily and denies abdominal pain, nausea, vomiting or diarrhea. He reports a healthy appetite and is maintaining his weight. He continues with significant fatigue likely attributed to a combination of radiation and ADT. His last Lupron injection was given on 01/12/2016. He has not had a follow-up with Dr. Karsten Ro for repeat injection.  ALLERGIES:  is allergic to penicillins; antihistamines, loratadine-type; and other.  Meds: Current Outpatient Prescriptions  Medication Sig Dispense Refill  . allopurinol (ZYLOPRIM) 100 MG tablet Take 100 mg by mouth daily.     Marland Kitchen buPROPion (WELLBUTRIN  XL) 150 MG 24 hr tablet Take 1 tablet (150 mg total) by mouth daily. 30 tablet 1  . citalopram (CELEXA) 40 MG tablet Take 40 mg by mouth daily.     Marland Kitchen dextromethorphan-guaiFENesin (MUCINEX DM) 30-600 MG 12hr tablet Take 1 tablet by mouth 2 (two) times daily.    Marland Kitchen diltiazem (CARTIA XT) 120 MG 24 hr capsule Take 120 mg by mouth at bedtime.    . fluticasone (FLONASE) 50 MCG/ACT nasal spray Place 2 sprays into both nostrils daily. (Patient taking differently: Place 2 sprays into both nostrils daily as needed for allergies or rhinitis. ) 16 g 1  . furosemide (LASIX) 80 MG tablet Take 80 mg by mouth daily.     . iron polysaccharides (NIFEREX) 150 MG capsule Take 1 capsule (150 mg total) by mouth 2 (two) times daily. (Patient taking differently: Take 150 mg by mouth daily. ) 60 capsule 0  . lovastatin (MEVACOR) 20 MG tablet Take 20 mg by mouth at bedtime.     . meclizine (ANTIVERT) 25 MG tablet Take 1 tablet (25 mg total) by mouth 3 (three) times daily as needed for dizziness. (Patient taking differently: Take 25 mg by mouth 2 (two) times daily. ) 30 tablet 0  . metFORMIN (GLUCOPHAGE) 1000 MG tablet Take 1,000 mg by mouth 2 (two) times daily.   0  . Multiple Vitamins-Minerals (VISION FORMULA PO) Take 1 tablet by mouth 2 (two) times daily.    Marland Kitchen  OXYGEN Inhale 2 L into the lungs as needed (shortness of breath).     . pantoprazole (PROTONIX) 40 MG tablet Take 40 mg by mouth daily.    . potassium chloride SA (K-DUR,KLOR-CON) 20 MEQ tablet Take 20 mEq by mouth daily.   0  . sitaGLIPtin (JANUVIA) 100 MG tablet Take 100 mg by mouth daily.    . temazepam (RESTORIL) 15 MG capsule Take 15 mg by mouth at bedtime.     . valsartan (DIOVAN) 160 MG tablet Take 160 mg by mouth daily.      No current facility-administered medications for this encounter.     Physical Findings:  height is 6" (0.152 m) (abnormal) and weight is 235 lb 6.4 oz (106.8 kg). His oral temperature is 97.7 F (36.5 C). His blood pressure is 146/68  (abnormal) and his pulse is 83. His respiration is 18 and oxygen saturation is 98%.  Pain Assessment Pain Score: 0-No pain/10 In general this is a well appearing Caucasian male in no acute distress. He's alert and oriented x4 and appropriate throughout the examination. Cardiopulmonary assessment is negative for acute distress and he exhibits normal effort.   Lab Findings: Lab Results  Component Value Date   WBC 2.4 (L) 05/18/2016   HGB 8.6 (L) 05/18/2016   HCT 27.6 (L) 05/18/2016   MCV 87.1 05/18/2016   PLT 113 (L) 05/18/2016     Radiographic Findings: No results found.  Impression/Plan: 1. Stage T2a adenocarcinoma of the prostate with a Gleason's score of 4+4 and a PSA of 9.90.  He will continue to follow up with urology for ongoing PSA determinations.  He does not currently have a follow up appointment scheduled with Dr. Karsten Ro but I called today and they advised that Dr. Simone Curia nurse will call the patient tomorrow with a date/time for repeat Lupron injection and follow up visit. He has plans to continue ADT for a total of 2 years. He understands what to expect with regards to PSA monitoring going forward. I will look forward to following his response to therapy via correspondence with urology, and would be happy to continue to participate in his care if clinically indicated. I talked to the patient about what to expect in the future, including his risk for erectile dysfunction rectal bleeding. I encouraged him to call or return to the office if he has any questions regarding his previous radiation or possible radiation side effects. He was comfortable with this plan and will follow up as needed.    Nicholos Johns, PA-C

## 2016-07-14 ENCOUNTER — Ambulatory Visit
Admission: RE | Admit: 2016-07-14 | Discharge: 2016-07-14 | Disposition: A | Discharge status: Home / Self Care | Payer: PPO | Source: Ambulatory Visit | Attending: Urology | Admitting: Urology

## 2016-07-14 ENCOUNTER — Encounter: Payer: Self-pay | Admitting: Urology

## 2016-07-14 VITALS — BP 146/68 | HR 83 | Temp 97.7°F | Resp 18 | Ht <= 58 in | Wt 235.4 lb

## 2016-07-14 DIAGNOSIS — Z79899 Other long term (current) drug therapy: Secondary | ICD-10-CM | POA: Diagnosis not present

## 2016-07-14 DIAGNOSIS — C61 Malignant neoplasm of prostate: Secondary | ICD-10-CM | POA: Diagnosis not present

## 2016-07-14 DIAGNOSIS — Z880 Allergy status to penicillin: Secondary | ICD-10-CM | POA: Insufficient documentation

## 2016-07-14 DIAGNOSIS — Z7984 Long term (current) use of oral hypoglycemic drugs: Secondary | ICD-10-CM | POA: Diagnosis not present

## 2016-07-16 DIAGNOSIS — G4733 Obstructive sleep apnea (adult) (pediatric): Secondary | ICD-10-CM | POA: Diagnosis not present

## 2016-07-16 DIAGNOSIS — J410 Simple chronic bronchitis: Secondary | ICD-10-CM | POA: Diagnosis not present

## 2016-07-16 DIAGNOSIS — E662 Morbid (severe) obesity with alveolar hypoventilation: Secondary | ICD-10-CM | POA: Diagnosis not present

## 2016-07-20 DIAGNOSIS — N400 Enlarged prostate without lower urinary tract symptoms: Secondary | ICD-10-CM | POA: Diagnosis not present

## 2016-07-20 DIAGNOSIS — C61 Malignant neoplasm of prostate: Secondary | ICD-10-CM | POA: Diagnosis not present

## 2016-07-26 DIAGNOSIS — M170 Bilateral primary osteoarthritis of knee: Secondary | ICD-10-CM | POA: Diagnosis not present

## 2016-07-26 DIAGNOSIS — M1711 Unilateral primary osteoarthritis, right knee: Secondary | ICD-10-CM | POA: Diagnosis not present

## 2016-07-26 DIAGNOSIS — M1712 Unilateral primary osteoarthritis, left knee: Secondary | ICD-10-CM | POA: Diagnosis not present

## 2016-08-15 DIAGNOSIS — G4733 Obstructive sleep apnea (adult) (pediatric): Secondary | ICD-10-CM | POA: Diagnosis not present

## 2016-08-15 DIAGNOSIS — J410 Simple chronic bronchitis: Secondary | ICD-10-CM | POA: Diagnosis not present

## 2016-08-15 DIAGNOSIS — E662 Morbid (severe) obesity with alveolar hypoventilation: Secondary | ICD-10-CM | POA: Diagnosis not present

## 2016-08-27 DIAGNOSIS — K625 Hemorrhage of anus and rectum: Secondary | ICD-10-CM | POA: Diagnosis not present

## 2016-09-15 DIAGNOSIS — G4733 Obstructive sleep apnea (adult) (pediatric): Secondary | ICD-10-CM | POA: Diagnosis not present

## 2016-09-15 DIAGNOSIS — J410 Simple chronic bronchitis: Secondary | ICD-10-CM | POA: Diagnosis not present

## 2016-09-15 DIAGNOSIS — E662 Morbid (severe) obesity with alveolar hypoventilation: Secondary | ICD-10-CM | POA: Diagnosis not present

## 2016-09-23 NOTE — Progress Notes (Signed)
Cardiology Office Note   Date:  09/26/2016   ID:  Joel [name], DOB 26-Jan-1933, MRN [id number]  PCP:  Wenda Low, MD  Cardiologist:   Minus Breeding, MD   Chief Complaint  Patient presents with  . Aortic Stenosis      History of Present Illness: Joel [name] is a 83 y.o. male who presents for evaluation of atrial fibrillation and aortic stenosis. He has long-standing shortness of breath but he also has chronic lung disease. He did have an evaluation in 2015 that included a low risk perfusion study with probable artifact. He also had a BNP level was only slightly over 100. It was felt that his breathing difficulties were most likely pulmonary. He wears oxygen at night but he doesn't carry this with him usually. He had an echo in May of last year with moderate AS. Of note he's had fibrillation with a Cox-Maze procedure cardioversions but has been intermittently in atrial fibrillation.   He had bleeding in Jan requiring transfusion.  He had AVMs.  He was changed to ASA.  However, he is not sure that he is taking the ASA.  He says he's having no noticeable palpitations. He's not having any presyncope or syncope. He's fatigued all the time. He's had chronic shortness of breath. She gets around slowly with a cane. He is limited by joint pains. He has had increased swelling in his left lower leg. He's not had any obvious bleeding and is going to have blood work done soon by his primary provider. He denies any chest pressure, neck or arm discomfort.   Past Medical History:  Diagnosis Date  . Allergic rhinitis   . Anemia   . Anxiety   . Aortic stenosis 2012   mild to mod   . Atrial fibrillation ([place]) 1991   with multiple DCCV  . Bipolar affective disorder ([place])   . COPD (chronic obstructive pulmonary disease) ([place])   . Diabetic neuropathy ([place])    in feet  . Diabetic neuropathy ([place])   . Dyslipidemia   . Fatigue    last year or so  . Gout   . Hypertension   . Insomnia   .  Obesity   . On home oxygen therapy 2 1/2 liters with bipap at night  . OSA (obstructive sleep apnea)    severe, uses bipap 16 1/2 by 12 or 13 setting  . Prostate cancer ([place])   . Rectal bleeding 12/16/2015  . Type 2 diabetes mellitus ([place])   . Vertigo     Past Surgical History:  Procedure Laterality Date  . CARDIOVERSION  yrs ago   prior to 1998  . COLONOSCOPY WITH PROPOFOL N/A 01/23/2013   Procedure: COLONOSCOPY WITH PROPOFOL;  Surgeon: Garlan Fair, MD;  Location: [facility] ENDOSCOPY;  Service: Endoscopy;  Laterality: N/A;  . electro shock  1969   for depression  . ENTEROSCOPY Left 05/17/2016   Procedure: ENTEROSCOPY;  Surgeon: Wilford Corner, MD;  Location: [facility] ENDOSCOPY;  Service: Endoscopy;  Laterality: Left;  . ESOPHAGOGASTRODUODENOSCOPY N/A 03/03/2016   Procedure: ESOPHAGOGASTRODUODENOSCOPY (EGD);  Surgeon: Teena Irani, MD;  Location: [facility];  Service: Endoscopy;  Laterality: N/A;  . ESOPHAGOGASTRODUODENOSCOPY N/A 03/26/2016   Procedure: ESOPHAGOGASTRODUODENOSCOPY (EGD);  Surgeon: Teena Irani, MD;  Location[facility] END[facility]COPY;  Service: Endoscopy;  Laterality: N/A;  would like to use ultraslim scope  . ESOPHAGOGASTRODUODENOSCOPY (EGD) WITH PROPOFOL N/A 01/23/2013   Procedure: ESOPHAGOGASTRODUODENOSCOPY (EGD) WITH PROPOFOL;  Surgeon: Garlan Fair, MD;  Location:  [facility] ENDOSCOPY;  Service: Endoscopy;  Laterality: N/A;  . ESOPHAGOGASTRODUODENOSCOPY (EGD) WITH PROPOFOL N/A 04/13/2016   Procedure: ESOPHAGOGASTRODUODENOSCOPY (EGD) WITH PROPOFOL;  Surgeon: Otis Brace, MD;  Location: [place];  Service: Gastroenterology;  Laterality: N/A;  . GIVENS CAPSULE STUDY N/A 03/03/2016   Procedure: GIVENS CAPSULE STUDY;  Surgeon: Teena Irani, MD;  Location: [facility] ENDOSCOPY;  Service: Endoscopy;  Laterality: N/A;  . HEMORRHOID SURGERY N/A 12/16/2015   Procedure: PROCTOSCOPY WITH CONTROL OF BLEEDING;  Surgeon: Fanny Skates, MD;  Location: [place];  Service: General;  Laterality: N/A;  . KNEE  SURGERY  1970's   rt  . MAZE  1998  . PROSTATE BIOPSY    . SHOULDER SURGERY  7-8 yrs ago   rt     Current Outpatient Prescriptions  Medication Sig Dispense Refill  . allopurinol (ZYLOPRIM) 100 MG tablet Take 100 mg by mouth daily.     Marland Kitchen aspirin EC 81 MG tablet Take 81 mg by mouth daily.    Marland Kitchen buPROPion (WELLBUTRIN XL) 150 MG 24 hr tablet Take 1 tablet (150 mg total) by mouth daily. 30 tablet 1  . citalopram (CELEXA) 40 MG tablet Take 40 mg by mouth daily.     Marland Kitchen dextromethorphan-guaiFENesin (MUCINEX DM) 30-600 MG 12hr tablet Take 1 tablet by mouth 2 (two) times daily.    Marland Kitchen diltiazem (CARTIA XT) 120 MG 24 hr capsule Take 120 mg by mouth at bedtime.    . fluticasone (FLONASE) 50 MCG/ACT nasal spray Place 2 sprays into both nostrils daily. (Patient taking differently: Place 2 sprays into both nostrils daily as needed for allergies or rhinitis. ) 16 g 1  . furosemide (LASIX) 80 MG tablet Take 80 mg by mouth daily.     . iron polysaccharides (NIFEREX) 150 MG capsule Take 1 capsule (150 mg total) by mouth 2 (two) times daily. (Patient taking differently: Take 150 mg by mouth daily. ) 60 capsule 0  . lovastatin (MEVACOR) 20 MG tablet Take 20 mg by mouth at bedtime.     . meclizine (ANTIVERT) 25 MG tablet Take 1 tablet (25 mg total) by mouth 3 (three) times daily as needed for dizziness. (Patient taking differently: Take 25 mg by mouth 2 (two) times daily. ) 30 tablet 0  . metFORMIN (GLUCOPHAGE) 1000 MG tablet Take 1,000 mg by mouth 2 (two) times daily.   0  . Multiple Vitamins-Minerals (VISION FORMULA PO) Take 1 tablet by mouth 2 (two) times daily.    . OXYGEN Inhale 2 L into the lungs as needed (shortness of breath).     . potassium chloride SA (K-DUR,KLOR-CON) 20 MEQ tablet Take 20 mEq by mouth daily.   0  . sitaGLIPtin (JANUVIA) 100 MG tablet Take 100 mg by mouth daily.    . temazepam (RESTORIL) 15 MG capsule Take 15 mg by mouth at bedtime.     . valsartan (DIOVAN) 160 MG tablet Take 160 mg by  mouth daily.      No current facility-administered medications for this visit.     Allergies:   Penicillins; Antihistamines, loratadine-type; and Other    ROS:  Please see the history of present illness.   Otherwise, review of systems are positive for none.   All other systems are reviewed and negative.    PHYSICAL EXAM: VS:  BP 122/72   Pulse 72   Ht 6' (1.829 m)   Wt 234 lb 6.4 oz (106.3 kg)   BMI 31.79 kg/m  , BMI Body mass index is  31.79 kg/m.  GENERAL:  Well appearing NECK:  No jugular venous distention, waveform within normal limits, carotid upstroke brisk and symmetric, no bruits, no thyromegaly LUNGS:  Clear to auscultation bilaterally CHEST:  Unremarkable HEART:  PMI not displaced or sustained,S1 and S2 within normal limits, no S3, no clicks, no rubs, 2 out of 6 apical systolic murmur slightly radiating out the outflow tract, no diastolic murmurs, irreg ABD:  Flat, positive bowel sounds normal in frequency in pitch, no bruits, no rebound, no guarding, no midline pulsatile mass, no hepatomegaly, no splenomegaly, obese and ventral hernia. EXT:  2 plus pulses upper and absent DP/PT lower, mild left calf edema, possible cord, no cyanosis no clubbing, chronic venous stasis changes.     EKG:  EKG is  ordered today. Atrial fibrillation, rate 72, axis within normal limits, intervals within normal limits, no acute ST-T wave changes.1   Recent Labs: 12/17/2015: Magnesium 2.1 04/12/2016: TSH 4.539 05/14/2016: ALT 22 05/18/2016: BUN 10; Creatinine, Ser 1.05; Hemoglobin 8.6; Platelets 113; Potassium 3.5; Sodium 137     Wt Readings from Last 3 Encounters:  09/24/16 234 lb 6.4 oz (106.3 kg)  07/14/16 235 lb 6.4 oz (106.8 kg)  05/17/16 236 lb (107 kg)      Other studies Reviewed: Additional studies/ records that were reviewed today include: Hospital records Review of the above records demonstrates:     See above   ASSESSMENT AND PLAN:   MODERATE AS:     Echo in May of  this year demonstrated that this was still moderate.  No change in therapy is indicated.  I can follow this clinically and probably with repeat echoes although he would be a poor candidate in the future even probably for TAVR  ATRIAL FIB:   Joel [name] has a CHA2DS2 - VASc score of 4 with a risk of stroke of 4%.    He cannot take anticoagulation. He should be taking a baby aspirin. He's going to have his CBC checked soon.  EDEMA:  He has tenderness and a possible cord on his left calf and will get a venous Doppler.  Current medicines are reviewed at length with the patient today.  The patient does not have concerns regarding medicines.  The following changes have been made:  As above  Labs/ tests ordered today include:    Orders Placed This Encounter  Procedures  . EKG 12-Lead     Disposition:   FU with me in 4 months.      Signed, Minus Breeding, MD  09/26/2016 8:54 PM    [facility]

## 2016-09-23 NOTE — Addendum Note (Signed)
Addendum  created 09/23/16 1305 by Michelene Keniston, MD   Sign clinical note    

## 2016-09-24 ENCOUNTER — Ambulatory Visit (INDEPENDENT_AMBULATORY_CARE_PROVIDER_SITE_OTHER): Payer: PPO | Admitting: Cardiology

## 2016-09-24 ENCOUNTER — Encounter: Payer: Self-pay | Admitting: Cardiology

## 2016-09-24 VITALS — BP 122/72 | HR 72 | Ht 72.0 in | Wt 234.4 lb

## 2016-09-24 DIAGNOSIS — I481 Persistent atrial fibrillation: Secondary | ICD-10-CM | POA: Diagnosis not present

## 2016-09-24 DIAGNOSIS — M7989 Other specified soft tissue disorders: Secondary | ICD-10-CM

## 2016-09-24 DIAGNOSIS — I350 Nonrheumatic aortic (valve) stenosis: Secondary | ICD-10-CM

## 2016-09-24 DIAGNOSIS — I4819 Other persistent atrial fibrillation: Secondary | ICD-10-CM

## 2016-09-24 NOTE — Patient Instructions (Signed)
Medication Instructions:  START- Aspirin 81 mg daily  Labwork: None Ordered  Testing/Procedures: Your physician has requested that you have a lower extremity venous duplex. This test is an ultrasound of the veins in the legs. It looks at venous blood flow that carries blood from the heart to the legs. Allow one hour for a Lower Venous exam. Allow thirty minutes for an Upper Venous exam. There are no restrictions or special instructions.  Follow-Up: Your physician recommends that you schedule a follow-up appointment in: 4 Months.   Any Other Special Instructions Will Be Listed Below (If Applicable).    If you need a refill on your cardiac medications before your next appointment, please call your pharmacy.

## 2016-09-26 ENCOUNTER — Encounter: Payer: Self-pay | Admitting: Cardiology

## 2016-09-26 DIAGNOSIS — M7989 Other specified soft tissue disorders: Secondary | ICD-10-CM | POA: Insufficient documentation

## 2016-09-27 DIAGNOSIS — G4733 Obstructive sleep apnea (adult) (pediatric): Secondary | ICD-10-CM | POA: Diagnosis not present

## 2016-09-27 DIAGNOSIS — E1165 Type 2 diabetes mellitus with hyperglycemia: Secondary | ICD-10-CM | POA: Diagnosis not present

## 2016-09-27 DIAGNOSIS — E1142 Type 2 diabetes mellitus with diabetic polyneuropathy: Secondary | ICD-10-CM | POA: Diagnosis not present

## 2016-09-27 DIAGNOSIS — J449 Chronic obstructive pulmonary disease, unspecified: Secondary | ICD-10-CM | POA: Diagnosis not present

## 2016-09-27 DIAGNOSIS — I482 Chronic atrial fibrillation: Secondary | ICD-10-CM | POA: Diagnosis not present

## 2016-09-27 DIAGNOSIS — N183 Chronic kidney disease, stage 3 (moderate): Secondary | ICD-10-CM | POA: Diagnosis not present

## 2016-09-27 DIAGNOSIS — I739 Peripheral vascular disease, unspecified: Secondary | ICD-10-CM | POA: Diagnosis not present

## 2016-09-27 DIAGNOSIS — Z7984 Long term (current) use of oral hypoglycemic drugs: Secondary | ICD-10-CM | POA: Diagnosis not present

## 2016-09-27 DIAGNOSIS — I10 Essential (primary) hypertension: Secondary | ICD-10-CM | POA: Diagnosis not present

## 2016-09-27 DIAGNOSIS — Q2733 Arteriovenous malformation of digestive system vessel: Secondary | ICD-10-CM | POA: Diagnosis not present

## 2016-09-27 DIAGNOSIS — D649 Anemia, unspecified: Secondary | ICD-10-CM | POA: Diagnosis not present

## 2016-09-27 DIAGNOSIS — K219 Gastro-esophageal reflux disease without esophagitis: Secondary | ICD-10-CM | POA: Diagnosis not present

## 2016-09-28 ENCOUNTER — Ambulatory Visit (HOSPITAL_COMMUNITY)
Admission: RE | Admit: 2016-09-28 | Discharge: 2016-09-28 | Disposition: A | Discharge status: Home / Self Care | Payer: PPO | Source: Ambulatory Visit | Attending: Cardiovascular Disease | Admitting: Cardiovascular Disease

## 2016-09-28 DIAGNOSIS — I10 Essential (primary) hypertension: Secondary | ICD-10-CM | POA: Diagnosis not present

## 2016-09-28 DIAGNOSIS — J449 Chronic obstructive pulmonary disease, unspecified: Secondary | ICD-10-CM | POA: Diagnosis not present

## 2016-09-28 DIAGNOSIS — M7989 Other specified soft tissue disorders: Secondary | ICD-10-CM | POA: Insufficient documentation

## 2016-09-28 DIAGNOSIS — E669 Obesity, unspecified: Secondary | ICD-10-CM | POA: Insufficient documentation

## 2016-09-28 DIAGNOSIS — E119 Type 2 diabetes mellitus without complications: Secondary | ICD-10-CM | POA: Insufficient documentation

## 2016-09-28 DIAGNOSIS — Z87891 Personal history of nicotine dependence: Secondary | ICD-10-CM | POA: Diagnosis not present

## 2016-09-28 DIAGNOSIS — E785 Hyperlipidemia, unspecified: Secondary | ICD-10-CM | POA: Insufficient documentation

## 2016-10-12 DIAGNOSIS — D508 Other iron deficiency anemias: Secondary | ICD-10-CM | POA: Diagnosis not present

## 2016-10-16 DIAGNOSIS — G4733 Obstructive sleep apnea (adult) (pediatric): Secondary | ICD-10-CM | POA: Diagnosis not present

## 2016-10-16 DIAGNOSIS — E662 Morbid (severe) obesity with alveolar hypoventilation: Secondary | ICD-10-CM | POA: Diagnosis not present

## 2016-10-16 DIAGNOSIS — J410 Simple chronic bronchitis: Secondary | ICD-10-CM | POA: Diagnosis not present

## 2016-10-20 DIAGNOSIS — D649 Anemia, unspecified: Secondary | ICD-10-CM | POA: Diagnosis not present

## 2016-10-20 DIAGNOSIS — R5383 Other fatigue: Secondary | ICD-10-CM | POA: Diagnosis not present

## 2016-10-20 DIAGNOSIS — D72819 Decreased white blood cell count, unspecified: Secondary | ICD-10-CM | POA: Diagnosis not present

## 2016-10-26 DIAGNOSIS — D509 Iron deficiency anemia, unspecified: Secondary | ICD-10-CM | POA: Diagnosis not present

## 2016-10-26 DIAGNOSIS — K922 Gastrointestinal hemorrhage, unspecified: Secondary | ICD-10-CM | POA: Diagnosis not present

## 2016-10-29 DIAGNOSIS — M1712 Unilateral primary osteoarthritis, left knee: Secondary | ICD-10-CM | POA: Diagnosis not present

## 2016-10-29 DIAGNOSIS — M1711 Unilateral primary osteoarthritis, right knee: Secondary | ICD-10-CM | POA: Diagnosis not present

## 2016-10-29 DIAGNOSIS — M170 Bilateral primary osteoarthritis of knee: Secondary | ICD-10-CM | POA: Diagnosis not present

## 2016-11-03 ENCOUNTER — Encounter: Payer: Self-pay | Admitting: Internal Medicine

## 2016-11-03 ENCOUNTER — Telehealth: Payer: Self-pay | Admitting: Internal Medicine

## 2016-11-03 NOTE — Telephone Encounter (Signed)
Appt has been scheduled for the pt to see Dr. Julien Nordmann on 10/11 at 1130am. Carla from Dr. Haskell Riling office will notify the pt. Letter mailed to the pt.

## 2016-11-11 ENCOUNTER — Encounter: Payer: PPO | Admitting: Internal Medicine

## 2016-11-15 DIAGNOSIS — H26491 Other secondary cataract, right eye: Secondary | ICD-10-CM | POA: Diagnosis not present

## 2016-11-15 DIAGNOSIS — H04123 Dry eye syndrome of bilateral lacrimal glands: Secondary | ICD-10-CM | POA: Diagnosis not present

## 2016-11-15 DIAGNOSIS — R5383 Other fatigue: Secondary | ICD-10-CM | POA: Diagnosis not present

## 2016-11-15 DIAGNOSIS — H353121 Nonexudative age-related macular degeneration, left eye, early dry stage: Secondary | ICD-10-CM | POA: Diagnosis not present

## 2016-11-15 DIAGNOSIS — D649 Anemia, unspecified: Secondary | ICD-10-CM | POA: Diagnosis not present

## 2016-11-15 DIAGNOSIS — Z79899 Other long term (current) drug therapy: Secondary | ICD-10-CM | POA: Diagnosis not present

## 2016-11-15 DIAGNOSIS — Z961 Presence of intraocular lens: Secondary | ICD-10-CM | POA: Diagnosis not present

## 2016-11-15 DIAGNOSIS — E119 Type 2 diabetes mellitus without complications: Secondary | ICD-10-CM | POA: Diagnosis not present

## 2016-12-01 DIAGNOSIS — H26491 Other secondary cataract, right eye: Secondary | ICD-10-CM | POA: Diagnosis not present

## 2016-12-08 DIAGNOSIS — D509 Iron deficiency anemia, unspecified: Secondary | ICD-10-CM | POA: Diagnosis not present

## 2016-12-08 DIAGNOSIS — G4733 Obstructive sleep apnea (adult) (pediatric): Secondary | ICD-10-CM | POA: Diagnosis not present

## 2016-12-29 ENCOUNTER — Telehealth: Payer: Self-pay | Admitting: Internal Medicine

## 2016-12-29 NOTE — Telephone Encounter (Signed)
Appt has been rescheduled for the pt to see Dr. Julien Nordmann on 12/11 at 1130am. Pt aware to arrive 30 minutes early.

## 2016-12-31 DIAGNOSIS — E782 Mixed hyperlipidemia: Secondary | ICD-10-CM | POA: Diagnosis not present

## 2016-12-31 DIAGNOSIS — F3341 Major depressive disorder, recurrent, in partial remission: Secondary | ICD-10-CM | POA: Diagnosis not present

## 2016-12-31 DIAGNOSIS — N183 Chronic kidney disease, stage 3 (moderate): Secondary | ICD-10-CM | POA: Diagnosis not present

## 2016-12-31 DIAGNOSIS — E1142 Type 2 diabetes mellitus with diabetic polyneuropathy: Secondary | ICD-10-CM | POA: Diagnosis not present

## 2016-12-31 DIAGNOSIS — I482 Chronic atrial fibrillation: Secondary | ICD-10-CM | POA: Diagnosis not present

## 2016-12-31 DIAGNOSIS — I10 Essential (primary) hypertension: Secondary | ICD-10-CM | POA: Diagnosis not present

## 2016-12-31 DIAGNOSIS — J449 Chronic obstructive pulmonary disease, unspecified: Secondary | ICD-10-CM | POA: Diagnosis not present

## 2016-12-31 DIAGNOSIS — C61 Malignant neoplasm of prostate: Secondary | ICD-10-CM | POA: Diagnosis not present

## 2016-12-31 DIAGNOSIS — D649 Anemia, unspecified: Secondary | ICD-10-CM | POA: Diagnosis not present

## 2016-12-31 DIAGNOSIS — Z7984 Long term (current) use of oral hypoglycemic drugs: Secondary | ICD-10-CM | POA: Diagnosis not present

## 2016-12-31 DIAGNOSIS — I509 Heart failure, unspecified: Secondary | ICD-10-CM | POA: Diagnosis not present

## 2016-12-31 DIAGNOSIS — D508 Other iron deficiency anemias: Secondary | ICD-10-CM | POA: Diagnosis not present

## 2017-01-11 ENCOUNTER — Encounter: Payer: PPO | Admitting: Internal Medicine

## 2017-01-12 ENCOUNTER — Encounter: Payer: Self-pay | Admitting: Cardiology
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
## 2017-01-17 ENCOUNTER — Ambulatory Visit: Payer: PPO | Admitting: Cardiology

## 2017-01-17 ENCOUNTER — Encounter: Payer: Self-pay | Admitting: Cardiology

## 2017-01-17 VITALS — BP 134/56 | HR 72 | Ht 72.0 in | Wt 227.0 lb

## 2017-01-17 DIAGNOSIS — I350 Nonrheumatic aortic (valve) stenosis: Secondary | ICD-10-CM

## 2017-01-17 DIAGNOSIS — C61 Malignant neoplasm of prostate: Secondary | ICD-10-CM | POA: Diagnosis not present

## 2017-01-17 DIAGNOSIS — R059 Cough, unspecified: Secondary | ICD-10-CM

## 2017-01-17 DIAGNOSIS — R05 Cough: Secondary | ICD-10-CM | POA: Diagnosis not present

## 2017-01-17 DIAGNOSIS — I481 Persistent atrial fibrillation: Secondary | ICD-10-CM | POA: Diagnosis not present

## 2017-01-17 DIAGNOSIS — I4819 Other persistent atrial fibrillation: Secondary | ICD-10-CM | POA: Insufficient documentation

## 2017-01-17 NOTE — Patient Instructions (Signed)

## 2017-01-18 DIAGNOSIS — R112 Nausea with vomiting, unspecified: Secondary | ICD-10-CM | POA: Diagnosis not present

## 2017-01-18 DIAGNOSIS — J101 Influenza due to other identified influenza virus with other respiratory manifestations: Secondary | ICD-10-CM | POA: Diagnosis not present

## 2017-01-18 DIAGNOSIS — R509 Fever, unspecified: Secondary | ICD-10-CM | POA: Diagnosis not present

## 2017-01-18 DIAGNOSIS — R05 Cough: Secondary | ICD-10-CM | POA: Diagnosis not present

## 2017-01-28 DIAGNOSIS — D649 Anemia, unspecified: Secondary | ICD-10-CM | POA: Diagnosis not present

## 2017-01-28 DIAGNOSIS — N183 Chronic kidney disease, stage 3 (moderate): Secondary | ICD-10-CM | POA: Diagnosis not present

## 2017-01-28 DIAGNOSIS — D509 Iron deficiency anemia, unspecified: Secondary | ICD-10-CM | POA: Diagnosis not present

## 2017-01-28 DIAGNOSIS — E1165 Type 2 diabetes mellitus with hyperglycemia: Secondary | ICD-10-CM | POA: Diagnosis not present

## 2017-01-28 DIAGNOSIS — I10 Essential (primary) hypertension: Secondary | ICD-10-CM | POA: Diagnosis not present

## 2017-01-28 DIAGNOSIS — I482 Chronic atrial fibrillation: Secondary | ICD-10-CM | POA: Diagnosis not present

## 2017-01-28 DIAGNOSIS — E1142 Type 2 diabetes mellitus with diabetic polyneuropathy: Secondary | ICD-10-CM | POA: Diagnosis not present

## 2017-01-28 DIAGNOSIS — I509 Heart failure, unspecified: Secondary | ICD-10-CM | POA: Diagnosis not present

## 2017-01-28 DIAGNOSIS — F3341 Major depressive disorder, recurrent, in partial remission: Secondary | ICD-10-CM | POA: Diagnosis not present

## 2017-01-28 DIAGNOSIS — J449 Chronic obstructive pulmonary disease, unspecified: Secondary | ICD-10-CM | POA: Diagnosis not present

## 2017-01-28 DIAGNOSIS — C61 Malignant neoplasm of prostate: Secondary | ICD-10-CM | POA: Diagnosis not present

## 2017-01-28 DIAGNOSIS — E782 Mixed hyperlipidemia: Secondary | ICD-10-CM | POA: Diagnosis not present

## 2017-02-04 DIAGNOSIS — H903 Sensorineural hearing loss, bilateral: Secondary | ICD-10-CM | POA: Diagnosis not present

## 2017-02-10 DIAGNOSIS — D509 Iron deficiency anemia, unspecified: Secondary | ICD-10-CM | POA: Diagnosis not present

## 2017-02-11 ENCOUNTER — Telehealth: Payer: Self-pay | Admitting: Medical Oncology

## 2017-02-11 DIAGNOSIS — M1711 Unilateral primary osteoarthritis, right knee: Secondary | ICD-10-CM | POA: Diagnosis not present

## 2017-02-11 DIAGNOSIS — M1712 Unilateral primary osteoarthritis, left knee: Secondary | ICD-10-CM | POA: Diagnosis not present

## 2017-02-11 NOTE — Telephone Encounter (Signed)
received fax that pt wants appt. I left message for him to call back and ask for Dr Johny Shears nurse. . Last note 6/13 Ashlyn Bruning.

## 2017-02-11 NOTE — Telephone Encounter (Signed)
Sam, Would you mind contacting this patient.  I received a note from medical oncology that the patient called requesting a follow up appointment.  He was treated for prostate cancer and released back to Urology in 07/2016 so I am not sure if he meant to call us or needs a follow up with his urologist.  I do not see any referral from [facility] to indicate that he needs to see [place] again. Thanks, -[facility]

## 2017-02-15 ENCOUNTER — Telehealth: Payer: Self-pay | Admitting: Radiation Oncology

## 2017-02-15 ENCOUNTER — Telehealth: Payer: Self-pay | Admitting: Medical Oncology

## 2017-02-15 DIAGNOSIS — H903 Sensorineural hearing loss, bilateral: Secondary | ICD-10-CM | POA: Diagnosis not present

## 2017-02-15 DIAGNOSIS — C61 Malignant neoplasm of prostate: Secondary | ICD-10-CM | POA: Diagnosis not present

## 2017-02-15 NOTE — Telephone Encounter (Signed)
Phoned patient to inquire of need. Patient requesting help rescheduling follow up with Dr. Curt Bears. Patient reports he had to cancel his last two appointments one due to weather and the other flu. Patient is hoping for an afternoon appointment. Patient understands this RN will reach out to Dr. Worthy Flank nurse and she will phone him with an appointment. Also, patient reports he is seeing Dr. Karsten Ro today for another Lupron injection.

## 2017-02-15 NOTE — Telephone Encounter (Signed)
I left message . I told pt we had him scheduled for 2 appts and he did not show up. I instructed him to contact PCP to refer pt again.

## 2017-02-21 DIAGNOSIS — E1142 Type 2 diabetes mellitus with diabetic polyneuropathy: Secondary | ICD-10-CM | POA: Diagnosis not present

## 2017-02-21 DIAGNOSIS — E291 Testicular hypofunction: Secondary | ICD-10-CM | POA: Diagnosis not present

## 2017-02-21 DIAGNOSIS — F3341 Major depressive disorder, recurrent, in partial remission: Secondary | ICD-10-CM | POA: Diagnosis not present

## 2017-02-21 DIAGNOSIS — F419 Anxiety disorder, unspecified: Secondary | ICD-10-CM | POA: Diagnosis not present

## 2017-02-21 DIAGNOSIS — D509 Iron deficiency anemia, unspecified: Secondary | ICD-10-CM | POA: Diagnosis not present

## 2017-02-21 DIAGNOSIS — I509 Heart failure, unspecified: Secondary | ICD-10-CM | POA: Diagnosis not present

## 2017-02-21 DIAGNOSIS — G4733 Obstructive sleep apnea (adult) (pediatric): Secondary | ICD-10-CM | POA: Diagnosis not present

## 2017-02-21 DIAGNOSIS — M109 Gout, unspecified: Secondary | ICD-10-CM | POA: Diagnosis not present

## 2017-02-21 DIAGNOSIS — E782 Mixed hyperlipidemia: Secondary | ICD-10-CM | POA: Diagnosis not present

## 2017-02-21 DIAGNOSIS — Z0000 Encounter for general adult medical examination without abnormal findings: Secondary | ICD-10-CM | POA: Diagnosis not present

## 2017-02-21 DIAGNOSIS — I482 Chronic atrial fibrillation: Secondary | ICD-10-CM | POA: Diagnosis not present

## 2017-02-21 DIAGNOSIS — I350 Nonrheumatic aortic (valve) stenosis: Secondary | ICD-10-CM | POA: Diagnosis not present

## 2017-02-21 DIAGNOSIS — N183 Chronic kidney disease, stage 3 (moderate): Secondary | ICD-10-CM | POA: Diagnosis not present

## 2017-02-21 DIAGNOSIS — Z1389 Encounter for screening for other disorder: Secondary | ICD-10-CM | POA: Diagnosis not present

## 2017-02-21 DIAGNOSIS — I10 Essential (primary) hypertension: Secondary | ICD-10-CM | POA: Diagnosis not present

## 2017-03-09 DIAGNOSIS — I509 Heart failure, unspecified: Secondary | ICD-10-CM | POA: Diagnosis not present

## 2017-03-09 DIAGNOSIS — F3341 Major depressive disorder, recurrent, in partial remission: Secondary | ICD-10-CM | POA: Diagnosis not present

## 2017-03-09 DIAGNOSIS — J449 Chronic obstructive pulmonary disease, unspecified: Secondary | ICD-10-CM | POA: Diagnosis not present

## 2017-03-09 DIAGNOSIS — E782 Mixed hyperlipidemia: Secondary | ICD-10-CM | POA: Diagnosis not present

## 2017-03-09 DIAGNOSIS — E1142 Type 2 diabetes mellitus with diabetic polyneuropathy: Secondary | ICD-10-CM | POA: Diagnosis not present

## 2017-03-09 DIAGNOSIS — D509 Iron deficiency anemia, unspecified: Secondary | ICD-10-CM | POA: Diagnosis not present

## 2017-03-09 DIAGNOSIS — I10 Essential (primary) hypertension: Secondary | ICD-10-CM | POA: Diagnosis not present

## 2017-03-09 DIAGNOSIS — N182 Chronic kidney disease, stage 2 (mild): Secondary | ICD-10-CM | POA: Diagnosis not present

## 2017-03-17 ENCOUNTER — Encounter: Payer: Self-pay | Admitting: Hematology and Oncology

## 2017-03-17 ENCOUNTER — Inpatient Hospital Stay: Payer: PPO

## 2017-03-17 ENCOUNTER — Inpatient Hospital Stay: Payer: PPO | Attending: Internal Medicine | Admitting: Hematology and Oncology

## 2017-03-17 ENCOUNTER — Telehealth: Payer: Self-pay

## 2017-03-17 ENCOUNTER — Encounter: Payer: Self-pay | Admitting: General Practice

## 2017-03-17 ENCOUNTER — Other Ambulatory Visit: Payer: Self-pay

## 2017-03-17 VITALS — BP 141/54 | HR 72 | Temp 97.7°F | Resp 18 | Ht 72.0 in | Wt 236.6 lb

## 2017-03-17 DIAGNOSIS — Q279 Congenital malformation of peripheral vascular system, unspecified: Secondary | ICD-10-CM

## 2017-03-17 DIAGNOSIS — D509 Iron deficiency anemia, unspecified: Secondary | ICD-10-CM | POA: Diagnosis not present

## 2017-03-17 DIAGNOSIS — K922 Gastrointestinal hemorrhage, unspecified: Secondary | ICD-10-CM | POA: Diagnosis not present

## 2017-03-17 DIAGNOSIS — Q2739 Arteriovenous malformation, other site: Secondary | ICD-10-CM | POA: Insufficient documentation

## 2017-03-17 DIAGNOSIS — D500 Iron deficiency anemia secondary to blood loss (chronic): Secondary | ICD-10-CM

## 2017-03-17 LAB — CMP (CANCER CENTER ONLY)
ALT: 12 U/L (ref 0–55)
ANION GAP: 11 (ref 3–11)
AST: 15 U/L (ref 5–34)
Albumin: 3.9 g/dL (ref 3.5–5.0)
Alkaline Phosphatase: 71 U/L (ref 40–150)
BUN: 17 mg/dL (ref 7–26)
CALCIUM: 9.4 mg/dL (ref 8.4–10.4)
CO2: 28 mmol/L (ref 22–29)
CREATININE: 1.38 mg/dL — AB (ref 0.70–1.30)
Chloride: 103 mmol/L (ref 98–109)
GFR, EST AFRICAN AMERICAN: 53 mL/min — AB (ref >60)
GFR, EST NON AFRICAN AMERICAN: 46 mL/min — AB (ref >60)
Glucose, Bld: 140 mg/dL (ref 70–140)
Potassium: 3.9 mmol/L (ref 3.5–5.1)
SODIUM: 142 mmol/L (ref 136–145)
Total Bilirubin: 0.4 mg/dL (ref 0.2–1.2)
Total Protein: 7.2 g/dL (ref 6.4–8.3)

## 2017-03-17 LAB — CBC WITH DIFFERENTIAL (CANCER CENTER ONLY)
Basophils Absolute: 0 10*3/uL (ref 0.0–0.1)
Basophils Relative: 0 %
EOS ABS: 0.1 10*3/uL (ref 0.0–0.5)
EOS PCT: 3 %
HCT: 31.1 % — ABNORMAL LOW (ref 38.4–49.9)
Hemoglobin: 9.5 g/dL — ABNORMAL LOW (ref 13.0–17.1)
LYMPHS ABS: 0.6 10*3/uL — AB (ref 0.9–3.3)
LYMPHS PCT: 15 %
MCH: 28.8 pg (ref 27.2–33.4)
MCHC: 30.5 g/dL — AB (ref 32.0–36.0)
MCV: 94.2 fL (ref 79.3–98.0)
MONO ABS: 0.6 10*3/uL (ref 0.1–0.9)
MONOS PCT: 15 %
Neutro Abs: 2.8 10*3/uL (ref 1.5–6.5)
Neutrophils Relative %: 67 %
PLATELETS: 139 10*3/uL — AB (ref 140–400)
RBC: 3.3 MIL/uL — ABNORMAL LOW (ref 4.20–5.82)
RDW: 15.5 % — ABNORMAL HIGH (ref 11.0–14.6)
WBC Count: 4.2 10*3/uL (ref 4.0–10.3)

## 2017-03-17 LAB — SAMPLE TO BLOOD BANK

## 2017-03-17 NOTE — Progress Notes (Signed)
[place] CSW Progress Note  CSW received referral from [facility] RN/Dr Lebron Conners to assist w referrals for patient.  Met w patient and accompanying friend/neighbor in scheduling area.  Per patient and companion, patient needs additional support in the home due to risk of falls, increasing weakness/fatigue.  Patient lives alone although he has supportive neighbors and friends who check in on him periodically.  Friends are concerned that patient's home environment may need more frequent assessment, friend would like someone to come into the home twice/week to assess and provide needed support.  At this time, skilled home health has not been recommended.  Explained that some services may require private pay and would not be covered by Medicare.  Provided information on [facility] service, will mail patient information on this service.  Also will provide information on [employer] which has similar service.  CSW reviewed chart, noted that patient has been linked w [facility] in the past, asked desk nurse to refer again.  [facility] referral could assist w social work services as well as assessing other needs in home environment.  Patient also reported that he has episodes of low mood and has been diagnosed w bipolar disorder in the past.  Used to take psychiatric medications in the past, currently receives these from his PCP.  States he would like to speak w an outpatient therapist, agreeable to referral to [facility].  States he is able to drive to appointments.  Appointment made w Charolotte Eke  [facility] on 3/13 at 2 PM, needs to arrive at 1 PM for initial paperwork.  Mailed information packet to patient including appointment information and other community support resources.    Edwyna Shell, LCSW Clinical Social Worker Phone:  [phone]

## 2017-03-17 NOTE — Telephone Encounter (Signed)
Printed avs and calender for upcoming appointment. Per 2/14 los 

## 2017-03-18 ENCOUNTER — Telehealth: Payer: Self-pay

## 2017-03-18 ENCOUNTER — Telehealth: Payer: Self-pay | Admitting: General Practice

## 2017-03-18 ENCOUNTER — Other Ambulatory Visit: Payer: Self-pay

## 2017-03-18 LAB — IRON AND TIBC
Iron: 118 ug/dL (ref 42–163)
SATURATION RATIOS: 33 % — AB (ref 42–163)
TIBC: 354 ug/dL (ref 202–409)
UIBC: 235 ug/dL

## 2017-03-18 LAB — FERRITIN: FERRITIN: 21 ng/mL — AB (ref 22–316)

## 2017-03-18 NOTE — Telephone Encounter (Signed)
[place] CSW Progress Note  Spoke w patient, updated on appointment made at [facility], referral to [employer] and information packet mailed 2/14.  Encouraged to recontact CSW as needed for further support needs.  Edwyna Shell, LCSW Clinical Social Worker Phone:  [phone]

## 2017-03-18 NOTE — Telephone Encounter (Signed)
Added to transfussion time 2H. Per 2/14 los

## 2017-03-18 NOTE — Patient Outreach (Signed)
[facility]) Care Management  03/18/2017  AREK SPADAFORE [birth year] [id number]   Telephone call to patient for Md referral screening.  Patient states he is right in the middle of something and asked if he could call CM back or call him back. Advised patient that CM would call back next week.  Patient in agreement.  Plan: RN CM will contact patient on Monday for screening.   [name], RN, MSN [facility] Care Management Care Management Coordinator Direct Line [phone] Toll Free: [phone]  Fax: [phone]

## 2017-03-21 ENCOUNTER — Other Ambulatory Visit: Payer: Self-pay

## 2017-03-21 NOTE — Patient Outreach (Signed)
[name]) Care Management  03/21/2017  Joel [name] 1932/05/16 [id number]   2nd telephone call to patient for screening call.  No answer.  HIPAA compliant voice message left.   Plan: RN CM will send letter to attempt outreach.   [name], RN, MSN [facility] Care Management Care Management Coordinator Direct Line [phone] Toll Free: [phone]  Fax: [phone]

## 2017-03-22 ENCOUNTER — Emergency Department (HOSPITAL_COMMUNITY): Payer: PPO

## 2017-03-22 ENCOUNTER — Other Ambulatory Visit: Payer: Self-pay

## 2017-03-22 ENCOUNTER — Encounter (HOSPITAL_COMMUNITY): Payer: Self-pay | Admitting: Emergency Medicine

## 2017-03-22 ENCOUNTER — Inpatient Hospital Stay (HOSPITAL_COMMUNITY)
Admission: EM | Admit: 2017-03-22 | Discharge: 2017-03-27 | DRG: 481 | Disposition: A | Discharge status: Skilled Nursing Facility | Payer: PPO | Attending: Family Medicine | Admitting: Family Medicine

## 2017-03-22 DIAGNOSIS — I4891 Unspecified atrial fibrillation: Secondary | ICD-10-CM | POA: Diagnosis not present

## 2017-03-22 DIAGNOSIS — Z8546 Personal history of malignant neoplasm of prostate: Secondary | ICD-10-CM | POA: Diagnosis not present

## 2017-03-22 DIAGNOSIS — Z9981 Dependence on supplemental oxygen: Secondary | ICD-10-CM

## 2017-03-22 DIAGNOSIS — S72011D Unspecified intracapsular fracture of right femur, subsequent encounter for closed fracture with routine healing: Secondary | ICD-10-CM | POA: Diagnosis not present

## 2017-03-22 DIAGNOSIS — R278 Other lack of coordination: Secondary | ICD-10-CM | POA: Diagnosis not present

## 2017-03-22 DIAGNOSIS — M1000 Idiopathic gout, unspecified site: Secondary | ICD-10-CM | POA: Diagnosis not present

## 2017-03-22 DIAGNOSIS — F3289 Other specified depressive episodes: Secondary | ICD-10-CM | POA: Diagnosis not present

## 2017-03-22 DIAGNOSIS — F419 Anxiety disorder, unspecified: Secondary | ICD-10-CM | POA: Diagnosis not present

## 2017-03-22 DIAGNOSIS — K5900 Constipation, unspecified: Secondary | ICD-10-CM | POA: Diagnosis not present

## 2017-03-22 DIAGNOSIS — K922 Gastrointestinal hemorrhage, unspecified: Secondary | ICD-10-CM | POA: Diagnosis present

## 2017-03-22 DIAGNOSIS — F319 Bipolar disorder, unspecified: Secondary | ICD-10-CM | POA: Diagnosis present

## 2017-03-22 DIAGNOSIS — S72011A Unspecified intracapsular fracture of right femur, initial encounter for closed fracture: Secondary | ICD-10-CM

## 2017-03-22 DIAGNOSIS — I4819 Other persistent atrial fibrillation: Secondary | ICD-10-CM | POA: Diagnosis present

## 2017-03-22 DIAGNOSIS — S299XXA Unspecified injury of thorax, initial encounter: Secondary | ICD-10-CM | POA: Diagnosis not present

## 2017-03-22 DIAGNOSIS — E569 Vitamin deficiency, unspecified: Secondary | ICD-10-CM | POA: Diagnosis not present

## 2017-03-22 DIAGNOSIS — D500 Iron deficiency anemia secondary to blood loss (chronic): Secondary | ICD-10-CM | POA: Diagnosis not present

## 2017-03-22 DIAGNOSIS — N179 Acute kidney failure, unspecified: Secondary | ICD-10-CM | POA: Diagnosis present

## 2017-03-22 DIAGNOSIS — Y92009 Unspecified place in unspecified non-institutional (private) residence as the place of occurrence of the external cause: Secondary | ICD-10-CM

## 2017-03-22 DIAGNOSIS — G4700 Insomnia, unspecified: Secondary | ICD-10-CM | POA: Diagnosis not present

## 2017-03-22 DIAGNOSIS — Z79899 Other long term (current) drug therapy: Secondary | ICD-10-CM | POA: Diagnosis not present

## 2017-03-22 DIAGNOSIS — J449 Chronic obstructive pulmonary disease, unspecified: Secondary | ICD-10-CM | POA: Diagnosis present

## 2017-03-22 DIAGNOSIS — R140 Abdominal distension (gaseous): Secondary | ICD-10-CM

## 2017-03-22 DIAGNOSIS — J3089 Other allergic rhinitis: Secondary | ICD-10-CM | POA: Diagnosis not present

## 2017-03-22 DIAGNOSIS — E119 Type 2 diabetes mellitus without complications: Secondary | ICD-10-CM | POA: Diagnosis not present

## 2017-03-22 DIAGNOSIS — M170 Bilateral primary osteoarthritis of knee: Secondary | ICD-10-CM | POA: Diagnosis present

## 2017-03-22 DIAGNOSIS — S72001A Fracture of unspecified part of neck of right femur, initial encounter for closed fracture: Secondary | ICD-10-CM | POA: Diagnosis not present

## 2017-03-22 DIAGNOSIS — I481 Persistent atrial fibrillation: Secondary | ICD-10-CM | POA: Diagnosis not present

## 2017-03-22 DIAGNOSIS — E1122 Type 2 diabetes mellitus with diabetic chronic kidney disease: Secondary | ICD-10-CM | POA: Diagnosis present

## 2017-03-22 DIAGNOSIS — G4733 Obstructive sleep apnea (adult) (pediatric): Secondary | ICD-10-CM | POA: Diagnosis not present

## 2017-03-22 DIAGNOSIS — M109 Gout, unspecified: Secondary | ICD-10-CM | POA: Diagnosis present

## 2017-03-22 DIAGNOSIS — Z87891 Personal history of nicotine dependence: Secondary | ICD-10-CM

## 2017-03-22 DIAGNOSIS — S92153A Displaced avulsion fracture (chip fracture) of unspecified talus, initial encounter for closed fracture: Secondary | ICD-10-CM | POA: Diagnosis not present

## 2017-03-22 DIAGNOSIS — Z6831 Body mass index (BMI) 31.0-31.9, adult: Secondary | ICD-10-CM

## 2017-03-22 DIAGNOSIS — M25551 Pain in right hip: Secondary | ICD-10-CM | POA: Diagnosis not present

## 2017-03-22 DIAGNOSIS — G8911 Acute pain due to trauma: Secondary | ICD-10-CM | POA: Diagnosis not present

## 2017-03-22 DIAGNOSIS — S72041A Displaced fracture of base of neck of right femur, initial encounter for closed fracture: Secondary | ICD-10-CM | POA: Diagnosis not present

## 2017-03-22 DIAGNOSIS — T148XXA Other injury of unspecified body region, initial encounter: Secondary | ICD-10-CM | POA: Diagnosis not present

## 2017-03-22 DIAGNOSIS — E1140 Type 2 diabetes mellitus with diabetic neuropathy, unspecified: Secondary | ICD-10-CM | POA: Diagnosis not present

## 2017-03-22 DIAGNOSIS — I129 Hypertensive chronic kidney disease with stage 1 through stage 4 chronic kidney disease, or unspecified chronic kidney disease: Secondary | ICD-10-CM | POA: Diagnosis present

## 2017-03-22 DIAGNOSIS — Z7984 Long term (current) use of oral hypoglycemic drugs: Secondary | ICD-10-CM | POA: Diagnosis not present

## 2017-03-22 DIAGNOSIS — K219 Gastro-esophageal reflux disease without esophagitis: Secondary | ICD-10-CM | POA: Diagnosis not present

## 2017-03-22 DIAGNOSIS — E785 Hyperlipidemia, unspecified: Secondary | ICD-10-CM | POA: Diagnosis not present

## 2017-03-22 DIAGNOSIS — E669 Obesity, unspecified: Secondary | ICD-10-CM | POA: Diagnosis not present

## 2017-03-22 DIAGNOSIS — R42 Dizziness and giddiness: Secondary | ICD-10-CM | POA: Diagnosis not present

## 2017-03-22 DIAGNOSIS — I350 Nonrheumatic aortic (valve) stenosis: Secondary | ICD-10-CM | POA: Diagnosis not present

## 2017-03-22 DIAGNOSIS — N189 Chronic kidney disease, unspecified: Secondary | ICD-10-CM | POA: Diagnosis present

## 2017-03-22 DIAGNOSIS — W010XXA Fall on same level from slipping, tripping and stumbling without subsequent striking against object, initial encounter: Secondary | ICD-10-CM | POA: Diagnosis present

## 2017-03-22 DIAGNOSIS — Z9181 History of falling: Secondary | ICD-10-CM | POA: Diagnosis not present

## 2017-03-22 DIAGNOSIS — R262 Difficulty in walking, not elsewhere classified: Secondary | ICD-10-CM | POA: Diagnosis not present

## 2017-03-22 DIAGNOSIS — Z4789 Encounter for other orthopedic aftercare: Secondary | ICD-10-CM | POA: Diagnosis not present

## 2017-03-22 DIAGNOSIS — M6281 Muscle weakness (generalized): Secondary | ICD-10-CM | POA: Diagnosis not present

## 2017-03-22 LAB — BASIC METABOLIC PANEL
Anion gap: 11 (ref 5–15)
BUN: 18 mg/dL (ref 6–20)
CO2: 23 mmol/L (ref 22–32)
Calcium: 8.8 mg/dL — ABNORMAL LOW (ref 8.9–10.3)
Chloride: 106 mmol/L (ref 101–111)
Creatinine, Ser: 1.39 mg/dL — ABNORMAL HIGH (ref 0.61–1.24)
GFR calc Af Amer: 53 mL/min — ABNORMAL LOW (ref >60)
GFR calc non Af Amer: 46 mL/min — ABNORMAL LOW (ref >60)
Glucose, Bld: 184 mg/dL — ABNORMAL HIGH (ref 65–99)
Potassium: 4.1 mmol/L (ref 3.5–5.1)
Sodium: 140 mmol/L (ref 135–145)

## 2017-03-22 LAB — CBC WITH DIFFERENTIAL/PLATELET
Basophils Absolute: 0 10*3/uL (ref 0.0–0.1)
Basophils Relative: 0 %
Eosinophils Absolute: 0 10*3/uL (ref 0.0–0.7)
Eosinophils Relative: 0 %
HCT: 30.9 % — ABNORMAL LOW (ref 39.0–52.0)
Hemoglobin: 9.5 g/dL — ABNORMAL LOW (ref 13.0–17.0)
Lymphocytes Relative: 4 %
Lymphs Abs: 0.4 10*3/uL — ABNORMAL LOW (ref 0.7–4.0)
MCH: 28.9 pg (ref 26.0–34.0)
MCHC: 30.7 g/dL (ref 30.0–36.0)
MCV: 93.9 fL (ref 78.0–100.0)
Monocytes Absolute: 0.6 10*3/uL (ref 0.1–1.0)
Monocytes Relative: 7 %
Neutro Abs: 8 10*3/uL — ABNORMAL HIGH (ref 1.7–7.7)
Neutrophils Relative %: 89 %
Platelets: 165 10*3/uL (ref 150–400)
RBC: 3.29 MIL/uL — ABNORMAL LOW (ref 4.22–5.81)
RDW: 15.5 % (ref 11.5–15.5)
WBC: 9.1 10*3/uL (ref 4.0–10.5)

## 2017-03-22 MED ORDER — MORPHINE SULFATE (PF) 4 MG/ML IV SOLN
4.0000 mg | Freq: Once | INTRAVENOUS | Status: AC
  Administered 2017-03-22: 4 mg via INTRAVENOUS
  Filled 2017-03-22: qty 1

## 2017-03-22 MED ORDER — MORPHINE SULFATE (PF) 2 MG/ML IV SOLN
0.5000 mg | INTRAVENOUS | Status: DC | PRN
  Administered 2017-03-23: 0.5 mg via INTRAVENOUS
  Filled 2017-03-22: qty 1

## 2017-03-22 MED ORDER — HYDROCODONE-ACETAMINOPHEN 5-325 MG PO TABS
1.0000 | ORAL_TABLET | Freq: Four times a day (QID) | ORAL | Status: DC | PRN
  Administered 2017-03-23 – 2017-03-24 (×4): 2 via ORAL
  Filled 2017-03-22 (×4): qty 2

## 2017-03-22 MED ORDER — SODIUM CHLORIDE 0.9 % IV BOLUS (SEPSIS)
1000.0000 mL | Freq: Once | INTRAVENOUS | Status: AC
  Administered 2017-03-22: 1000 mL via INTRAVENOUS

## 2017-03-22 MED ORDER — ONDANSETRON HCL 4 MG/2ML IJ SOLN
4.0000 mg | Freq: Once | INTRAMUSCULAR | Status: AC
  Administered 2017-03-22: 4 mg via INTRAVENOUS
  Filled 2017-03-22: qty 2

## 2017-03-22 NOTE — ED Triage Notes (Signed)
Pt BIB EMS from home. Patient got up from a chair, lost balance and fell on right hip. Patient unable to bear weight on leg. No shortening or rotation noted by EMS. Pain shoots down from hip into leg. Given 100 mcg fentanyl and 4 mg zofran IV en route.

## 2017-03-22 NOTE — ED Notes (Signed)
Bed: KN18 Expected date:  Expected time:  Means of arrival:  Comments: 32 yr right hip pain

## 2017-03-22 NOTE — ED Provider Notes (Addendum)
[facility] DEPT Provider Note   CSN: [id number] Arrival date & time: 03/22/17  2055     History   Chief Complaint Chief Complaint  Patient presents with  . Fall  . Hip Pain    Right    HPI Joel [name] is a 84 y.o. male.  HPI   84 year old male with right hip pain.  Mechanical fall shortly before arrival.  He was getting up to let his dog out when he lost his balance and fell onto his right side.  Severe right hip pain since then.  Unable to bear weight secondary to pain.  Denies significant pain elsewhere.  He does have a past history of atrial fibrillation but is not anticoagulated.  Past Medical History:  Diagnosis Date  . Allergic rhinitis   . Anemia   . Anxiety   . Aortic stenosis 2012   mild to mod   . Atrial fibrillation ([name]) 1991   with multiple DCCV  . Bipolar affective disorder ([place])   . COPD (chronic obstructive pulmonary disease) ([place])   . Diabetic neuropathy ([place])    in feet  . Diabetic neuropathy ([place])   . Dyslipidemia   . Fatigue    last year or so  . Gout   . Hypertension   . Insomnia   . Obesity   . On home oxygen therapy 2 1/2 liters with bipap at night  . OSA (obstructive sleep apnea)    severe, uses bipap 16 1/2 by 12 or 13 setting  . Prostate cancer ([place])   . Rectal bleeding 12/16/2015  . Type 2 diabetes mellitus ([place])   . Vertigo     Patient Active Problem List   Diagnosis Date Noted  . Cough 01/17/2017  . Persistent atrial fibrillation ([place]) 01/17/2017  . Leg swelling 09/26/2016  . GI bleed 05/15/2016  . UGI bleed 05/14/2016  . COPD (chronic obstructive pulmonary disease) ([place]) 05/14/2016  . Chronic GI bleeding 04/12/2016  . HLD (hyperlipidemia) 04/12/2016  . Anemia due to chronic blood loss 04/12/2016  . Diabetes mellitus with complication ([place])   . BPPV (benign paroxysmal positional vertigo) 01/27/2016  . Hypertension 01/27/2016  . Malignant neoplasm of prostate ([place]) 01/07/2016  .  Paroxysmal atrial fibrillation (HCC)   . Bipolar I disorder ([place])   . Bright red rectal bleeding 12/16/2015  . Rectal bleeding 12/16/2015  . Pulmonary hypertension ([place]) 06/29/2015  . Moderate aortic stenosis 04/17/2013  . Encounter for monitoring Coumadin therapy 04/17/2013  . Anemia, iron deficiency 04/04/2013  . Dyspnea on exertion 11/18/2012  . Obesity hypoventilation syndrome ([place]) 09/19/2012  . ALLERGIC RHINITIS 06/24/2009  . DM2 (diabetes mellitus, type 2) ([place]) 03/13/2007  . OSA (obstructive sleep apnea) 03/13/2007  . COPD mixed type ([place]) 03/13/2007    Past Surgical History:  Procedure Laterality Date  . CARDIOVERSION  yrs ago   prior to 1998  . COLONOSCOPY WITH PROPOFOL N/A 01/23/2013   Procedure: COLONOSCOPY WITH PROPOFOL;  Surgeon: Garlan Fair, MD;  Location: [facility];  Service: Endoscopy;  Laterality: N/A;  . electro shock  1969   for depression  . ENTEROSCOPY Left 05/17/2016   Procedure: ENTEROSCOPY;  Surgeon: Wilford Corner, MD;  Location: [facility];  Service: Endoscopy;  Laterality: Left;  . ESOPHAGOGASTRODUODENOSCOPY N/A 03/03/2016   Procedure: ESOPHAGOGASTRODUODENOSCOPY (EGD);  Surgeon: Teena Irani, MD;  Location: [facility];  Service: Endoscopy;  Laterality: N/A;  . ESOPHAGOGASTRODUODENOSCOPY N/A 03/26/2016   Procedure: ESOPHAGOGASTRODUODENOSCOPY (EGD);  Surgeon: Teena Irani, MD;  Location: [facility];  Service: Endoscopy;  Laterality: N/A;  would like to use ultraslim scope  . ESOPHAGOGASTRODUODENOSCOPY (EGD) WITH PROPOFOL N/A 01/23/2013   Procedure: ESOPHAGOGASTRODUODENOSCOPY (EGD) WITH PROPOFOL;  Surgeon: Garlan Fair, MD;  Location: [facility];  Service: Endoscopy;  Laterality: N/A;  . ESOPHAGOGASTRODUODENOSCOPY (EGD) WITH PROPOFOL N/A 04/13/2016   Procedure: ESOPHAGOGASTRODUODENOSCOPY (EGD) WITH PROPOFOL;  Surgeon: Otis Brace, MD;  Location: [facility];  Service: Gastroenterology;  Laterality: N/A;  . GIVENS CAPSULE STUDY N/A  03/03/2016   Procedure: GIVENS CAPSULE STUDY;  Surgeon: Teena Irani, MD;  Location: [facility];  Service: Endoscopy;  Laterality: N/A;  . HEMORRHOID SURGERY N/A 12/16/2015   Procedure: PROCTOSCOPY WITH CONTROL OF BLEEDING;  Surgeon: Fanny Skates, MD;  Location: [facility];  Service: General;  Laterality: N/A;  . KNEE SURGERY  1970's   rt  . MAZE  1998  . PROSTATE BIOPSY    . SHOULDER SURGERY  7-8 yrs ago   rt       Home Medications    Prior to Admission medications   Medication Sig Start Date End Date Taking? Authorizing Provider  allopurinol (ZYLOPRIM) 100 MG tablet Take 100 mg by mouth daily.    Yes [provider]  Ascorbic Acid (VITAMIN C PO) Take 1 tablet by mouth daily.   Yes [provider]  buPROPion (WELLBUTRIN XL) 150 MG 24 hr tablet Take 1 tablet (150 mg total) by mouth daily. 12/18/15  Yes Ambrose Finland, MD  cholecalciferol (VITAMIN D) 1000 units tablet Take 1,000 Units by mouth daily.   Yes [provider]  citalopram (CELEXA) 40 MG tablet Take 40 mg by mouth daily.    Yes [provider]  Cyanocobalamin (VITAMIN B-12 PO) Take 1 tablet by mouth daily.   Yes [provider]  dextromethorphan-guaiFENesin (MUCINEX DM) 30-600 MG 12hr tablet Take 1 tablet by mouth 2 (two) times daily.   Yes [provider]  diltiazem (CARDIZEM SR) 120 MG 12 hr capsule Take 120 mg by mouth daily.  03/18/17  Yes [provider]  furosemide (LASIX) 80 MG tablet Take 80 mg by mouth daily.  10/22/15  Yes [provider]  glimepiride (AMARYL) 1 MG tablet Take 1 mg by mouth daily with breakfast.  03/01/17  Yes [provider]  iron polysaccharides (NIFEREX) 150 MG capsule Take 1 capsule (150 mg total) by mouth 2 (two) times daily. 10/23/14  Yes Eugenie Filler, MD  losartan (COZAAR) 50 MG tablet Take 50 mg by mouth daily. 02/21/17  Yes [provider]  lovastatin (MEVACOR) 20 MG tablet Take 20 mg by mouth  at bedtime.    Yes [provider]  meclizine (ANTIVERT) 25 MG tablet Take 1 tablet (25 mg total) by mouth 3 (three) times daily as needed for dizziness. Patient taking differently: Take 25 mg by mouth 2 (two) times daily.  01/28/16  Yes Eber Jones, MD  metFORMIN (GLUCOPHAGE) 1000 MG tablet Take 1,000 mg by mouth 2 (two) times daily.  05/17/14  Yes [provider]  Multiple Vitamins-Minerals (VISION FORMULA PO) Take 1 tablet by mouth 2 (two) times daily.   Yes [provider]  potassium chloride SA (K-DUR,KLOR-CON) 20 MEQ tablet Take 20 mEq by mouth daily.  09/10/14  Yes [provider]  temazepam (RESTORIL) 15 MG capsule Take 15 mg by mouth at bedtime.  02/04/16  Yes [provider]  valsartan (DIOVAN) 160 MG tablet Take 160 mg by mouth daily.    Yes  [provider]  fluticasone (FLONASE) 50 MCG/ACT nasal spray Place 2 sprays into both nostrils daily. Patient taking differently: Place 2 sprays into both nostrils daily as needed for allergies or rhinitis.  01/27/16   Waynetta Pean, PA-C  ipratropium (ATROVENT) 0.06 % nasal spray instill 2 sprays into each nostril four times a day as needed for allergies 01/18/17   [provider]  OXYGEN Inhale 2 L into the lungs as needed (shortness of breath).     [provider]    Family History Family History  Problem Relation Age of Onset  . Tuberculosis Maternal Grandmother   . Heart attack Neg Hx   . Diabetes Neg Hx   . CAD Neg Hx   . Cancer Neg Hx     Social History Social History   Tobacco Use  . Smoking status: Former Smoker    Packs/day: 2.00    Years: 25.00    Pack years: 50.00    Types: Cigarettes    Last attempt to quit: 07/08/1976    Years since quitting: 40.7  . Smokeless tobacco: Never Used  Substance Use Topics  . Alcohol use: No    Comment: in AA since 1977, no alcohol since 1977  . Drug use: No    Comment: marijuana use 37 years ago      Allergies   Penicillins; Antihistamines, loratadine-type; and Other   Review of Systems Review of Systems  All systems reviewed and negative, other than as noted in HPI.  Physical Exam Updated Vital Signs BP (!) 138/96 (BP Location: Right Arm)   Pulse 86   Temp 98 F (36.7 C) (Oral)   Resp 16   SpO2 91%   Physical Exam  Constitutional: He appears well-developed and well-nourished. No distress.  HENT:  Head: Normocephalic and atraumatic.  Eyes: Conjunctivae are normal. Right eye exhibits no discharge. Left eye exhibits no discharge.  Neck: Neck supple.  Cardiovascular: Normal rate, regular rhythm and normal heart sounds. Exam reveals no gallop and no friction rub.  No murmur heard. Pulmonary/Chest: Effort normal and breath sounds normal. No respiratory distress.  Abdominal: Soft. He exhibits no distension. There is no tenderness.  Musculoskeletal: He exhibits no edema or tenderness.  Right lower extremity grossly normal in appearance and symmetric as compared to the left.  Able to move foot.  Palpable DP pulse.  Sensation is intact to light touch.  Severe pain with any attempted range of motion of the right hip.  Neurological: He is alert.  Skin: Skin is warm and dry.  Psychiatric: He has a normal mood and affect. His behavior is normal. Thought content normal.  Nursing note and vitals reviewed.    ED Treatments / Results  Labs (all labs ordered are listed, but only abnormal results are displayed) Labs Reviewed  CBC WITH DIFFERENTIAL/PLATELET  BASIC METABOLIC PANEL    EKG  EKG Interpretation None       Radiology Dg Hip Unilat  With Pelvis 2-3 Views Right  Result Date: 03/22/2017 CLINICAL DATA:  Trip and fall at home this evening, right hip pain. EXAM: DG HIP (WITH OR WITHOUT PELVIS) 2-3V RIGHT COMPARISON:  None. FINDINGS: Impacted subcapital right femoral neck fracture. Femoral head remains seated. No additional acute fracture of the pelvis. Pubic  symphysis and sacroiliac joints are congruent. IMPRESSION: Impacted subcapital right femoral neck fracture. Electronically Signed   By: Jeb Levering M.D.   On: 03/22/2017 22:02    Procedures Procedures (including critical care time)  Medications  Ordered in ED Medications  sodium chloride 0.9 % bolus 1,000 mL (not administered)  ondansetron (ZOFRAN) injection 4 mg (not administered)  morphine 4 MG/ML injection 4 mg (not administered)     Initial Impression / Assessment and Plan / ED Course  I have reviewed the triage vital signs and the nursing notes.  Pertinent labs & imaging results that were available during my care of the patient were reviewed by me and considered in my medical decision making (see chart for details).     84 year old male with right hip pain after mechanical fall.  Right subcapital femoral neck fracture.  Closed injury.  Neurovascularly intact.  Will obtain basic preop studies.  Will discuss with orthopedics.  Medical admission.  10:45 PM Discussed with Dr. Rodell Perna, orthopedic surgery.  Requesting n.p.o. after midnight.  Final Clinical Impressions(s) / ED Diagnoses   Final diagnoses:  Closed subcapital fracture of femur, right, initial encounter [facility])    ED Discharge Orders    None       Virgel Manifold, MD 03/22/17 2238    Virgel Manifold, MD 03/22/17 2246

## 2017-03-22 NOTE — ED Notes (Signed)
ED TO INPATIENT HANDOFF REPORT  Name/Age/Gender Joel [name] 84 y.o. male  Code Status Code Status History    Date Active Date Inactive Code Status Order ID Comments User Context   05/15/2016 00:29 05/18/2016 17:37 Full Code [id number]  Reubin Milan, MD Inpatient   04/12/2016 15:49 04/14/2016 17:43 Full Code [id number]  Samella Parr, NP Inpatient   02/27/2016 02:22 03/04/2016 15:30 Full Code [id number]  Reubin Milan, MD ED   01/27/2016 22:44 01/28/2016 18:57 Full Code [id number]  Etta Quill, DO ED   12/16/2015 15:19 12/18/2015 19:20 Full Code [id number]  Fanny Skates, MD Inpatient   12/16/2015 12:53 12/16/2015 15:19 Full Code [id number]  Jerrye Beavers, PA-C ED   06/29/2015 03:06 06/30/2015 13:34 DNR [id number]  Toy Baker, MD ED   10/22/2014 21:55 10/23/2014 19:57 Full Code [id number]  Etta Quill, DO ED   08/04/2013 17:55 08/06/2013 00:58 DNR [id number]  Thurnell Lose, MD Inpatient      Home/SNF/Other Home  Chief Complaint hip pain  Level of Care/Admitting Diagnosis ED Disposition    ED Disposition Condition Comment   Admit  The patient appears reasonably stabilized for admission considering the current resources, flow, and capabilities available in the ED at this time, and I doubt any other [facility] requiring further screening and/or treatment in the ED prior to admission is  present.       Medical History Past Medical History:  Diagnosis Date  . Allergic rhinitis   . Anemia   . Anxiety   . Aortic stenosis 2012   mild to mod   . Atrial fibrillation ([place]) 1991   with multiple DCCV  . Bipolar affective disorder ([place])   . COPD (chronic obstructive pulmonary disease) ([place])   . Diabetic neuropathy ([place])    in feet  . Diabetic neuropathy ([place])   . Dyslipidemia   . Fatigue    last year or so  . Gout   . Hypertension   . Insomnia   . Obesity   . On home oxygen therapy 2 1/2 liters with bipap at night  . OSA (obstructive sleep apnea)    severe, uses bipap 16 1/2 by 12 or 13 setting  . Prostate cancer ([place])   . Rectal bleeding 12/16/2015  . Type 2 diabetes mellitus ([place])   . Vertigo     Allergies Allergies  Allergen Reactions  . Penicillins Anaphylaxis    Has patient had a PCN reaction causing immediate rash, facial/tongue/throat swelling, SOB or lightheadedness with hypotension: unknown Has patient had a PCN reaction causing severe rash involving mucus membranes or skin necrosis: unknown Has patient had a PCN reaction that required hospitalization: unknown Has patient had a PCN reaction occurring within the last 10 years: no If all of the above answers are "NO", then may proceed with Cephalosporin use.   Marland Kitchen Antihistamines, Loratadine-Type Other (See Comments)    depression  . Other     Beta blockers-Novacaine  Caused depression    IV Location/Drains/Wounds Patient Lines/Drains/Airways Status   Active Line/Drains/Airways    Name:   Placement date:   Placement time:   Site:   Days:   Peripheral IV 03/22/17 Left Forearm   03/22/17    -    Forearm   less than 1          Labs/Imaging Results for orders placed or performed during the hospital encounter of 03/22/17 (from the past 48 hour(s))  CBC with Differential     Status: Abnormal  Collection Time: 03/22/17 10:43 PM  Result Value Ref Range   WBC 9.1 4.0 - 10.5 K/uL   RBC 3.29 (L) 4.22 - 5.81 MIL/uL   Hemoglobin 9.5 (L) 13.0 - 17.0 g/dL   HCT 30.9 (L) 39.0 - 52.0 %   MCV 93.9 78.0 - 100.0 fL   MCH 28.9 26.0 - 34.0 pg   MCHC 30.7 30.0 - 36.0 g/dL   RDW 15.5 11.5 - 15.5 %   Platelets 165 150 - 400 K/uL   Neutrophils Relative % 89 %   Neutro Abs 8.0 (H) 1.7 - 7.7 K/uL   Lymphocytes Relative 4 %   Lymphs Abs 0.4 (L) 0.7 - 4.0 K/uL   Monocytes Relative 7 %   Monocytes Absolute 0.6 0.1 - 1.0 K/uL   Eosinophils Relative 0 %   Eosinophils Absolute 0.0 0.0 - 0.7 K/uL   Basophils Relative 0 %   Basophils Absolute 0.0 0.0 - 0.1 K/uL    Comment: Performed at  [facility], [place] [address], [place], [place] [address]  Basic metabolic panel     Status: Abnormal   Collection Time: 03/22/17 10:43 PM  Result Value Ref Range   Sodium 140 135 - 145 mmol/L   Potassium 4.1 3.5 - 5.1 mmol/L   Chloride 106 101 - 111 mmol/L   CO2 23 22 - 32 mmol/L   Glucose, Bld 184 (H) 65 - 99 mg/dL   BUN 18 6 - 20 mg/dL   Creatinine, Ser 1.39 (H) 0.61 - 1.24 mg/dL   Calcium 8.8 (L) 8.9 - 10.3 mg/dL   GFR calc non Af Amer 46 (L) >60 mL/min   GFR calc Af Amer 53 (L) >60 mL/min    Comment: (NOTE) The eGFR has been calculated using the CKD EPI equation. This calculation has not been validated in all clinical situations. eGFR's persistently <60 mL/min signify possible Chronic Kidney Disease.    Anion gap 11 5 - 15    Comment: Performed at [place] [facility], [place] [address], [place], [place] [address]   Dg Chest Portable 1 View  Result Date: 03/22/2017 CLINICAL DATA:  Fall today.  Preop, right hip fracture. EXAM: PORTABLE CHEST 1 VIEW COMPARISON:  01/18/2017 FINDINGS: Post median sternotomy. Chronic cardiomegaly. Chronic interstitial coarsening. Vascular congestion which is new. No confluent consolidation. No pleural fluid or pneumothorax. No acute osseous abnormalities. Chronic widening of right acromioclavicular joint. IMPRESSION: 1. Chronic cardiomegaly and interstitial coarsening. 2. Vascular congestion, new. Electronically Signed   By: Jeb Levering M.D.   On: 03/22/2017 23:09   Dg Hip Unilat  With Pelvis 2-3 Views Right  Result Date: 03/22/2017 CLINICAL DATA:  Trip and fall at home this evening, right hip pain. EXAM: DG HIP (WITH OR WITHOUT PELVIS) 2-3V RIGHT COMPARISON:  None. FINDINGS: Impacted subcapital right femoral neck fracture. Femoral head remains seated. No additional acute fracture of the pelvis. Pubic symphysis and sacroiliac joints are congruent. IMPRESSION: Impacted subcapital right femoral neck fracture. Electronically  Signed   By: Jeb Levering M.D.   On: 03/22/2017 22:02    Pending Labs Unresulted Labs (From admission, onward)   None      Vitals/Pain Today's Vitals   03/22/17 2109 03/22/17 2115 03/22/17 2341 03/22/17 2356  BP:  (!) 138/96 (!) 144/74   Pulse:  86 88   Resp:  16 18   Temp:  98 F (36.7 C)    TempSrc:  Oral    SpO2: 93% 91% 91%   PainSc:  Asleep    Isolation Precautions No active isolations  Medications Medications  sodium chloride 0.9 % bolus 1,000 mL (0 mLs Intravenous Stopped 03/22/17 2355)  ondansetron (ZOFRAN) injection 4 mg (4 mg Intravenous Given 03/22/17 2246)  morphine 4 MG/ML injection 4 mg (4 mg Intravenous Given 03/22/17 2246)    Mobility Walks with walker

## 2017-03-23 ENCOUNTER — Inpatient Hospital Stay (HOSPITAL_COMMUNITY): Payer: PPO | Admitting: Anesthesiology

## 2017-03-23 ENCOUNTER — Other Ambulatory Visit: Payer: Self-pay

## 2017-03-23 ENCOUNTER — Inpatient Hospital Stay (HOSPITAL_COMMUNITY): Payer: PPO

## 2017-03-23 ENCOUNTER — Encounter (HOSPITAL_COMMUNITY)
Admission: EM | Disposition: A | Discharge status: Skilled Nursing Facility | Payer: Self-pay | Source: Home / Self Care | Attending: Family Medicine

## 2017-03-23 ENCOUNTER — Telehealth: Payer: Self-pay | Admitting: *Deleted

## 2017-03-23 DIAGNOSIS — J449 Chronic obstructive pulmonary disease, unspecified: Secondary | ICD-10-CM

## 2017-03-23 DIAGNOSIS — D500 Iron deficiency anemia secondary to blood loss (chronic): Secondary | ICD-10-CM

## 2017-03-23 DIAGNOSIS — E119 Type 2 diabetes mellitus without complications: Secondary | ICD-10-CM

## 2017-03-23 DIAGNOSIS — I481 Persistent atrial fibrillation: Secondary | ICD-10-CM

## 2017-03-23 DIAGNOSIS — S72011A Unspecified intracapsular fracture of right femur, initial encounter for closed fracture: Principal | ICD-10-CM

## 2017-03-23 DIAGNOSIS — S72001A Fracture of unspecified part of neck of right femur, initial encounter for closed fracture: Secondary | ICD-10-CM

## 2017-03-23 DIAGNOSIS — K922 Gastrointestinal hemorrhage, unspecified: Secondary | ICD-10-CM

## 2017-03-23 DIAGNOSIS — S72041A Displaced fracture of base of neck of right femur, initial encounter for closed fracture: Secondary | ICD-10-CM

## 2017-03-23 HISTORY — PX: HIP PINNING,CANNULATED: SHX1758

## 2017-03-23 LAB — CBG MONITORING, ED
GLUCOSE-CAPILLARY: 150 mg/dL — AB (ref 65–99)
Glucose-Capillary: 154 mg/dL — ABNORMAL HIGH (ref 65–99)

## 2017-03-23 LAB — SURGICAL PCR SCREEN
MRSA, PCR: NEGATIVE
STAPHYLOCOCCUS AUREUS: NEGATIVE

## 2017-03-23 LAB — GLUCOSE, CAPILLARY
GLUCOSE-CAPILLARY: 132 mg/dL — AB (ref 65–99)
GLUCOSE-CAPILLARY: 123 mg/dL — AB (ref 65–99)
GLUCOSE-CAPILLARY: 137 mg/dL — AB (ref 65–99)
Glucose-Capillary: 176 mg/dL — ABNORMAL HIGH (ref 65–99)

## 2017-03-23 LAB — TYPE AND SCREEN
ABO/RH(D): O POS
ANTIBODY SCREEN: NEGATIVE

## 2017-03-23 SURGERY — FIXATION, FEMUR, NECK, PERCUTANEOUS, USING SCREW
Anesthesia: General | Site: Hip | Laterality: Right

## 2017-03-23 MED ORDER — ACETAMINOPHEN 10 MG/ML IV SOLN
INTRAVENOUS | Status: AC
  Filled 2017-03-23: qty 100

## 2017-03-23 MED ORDER — PROPOFOL 10 MG/ML IV BOLUS
INTRAVENOUS | Status: DC | PRN
  Administered 2017-03-23: 100 mg via INTRAVENOUS

## 2017-03-23 MED ORDER — ONDANSETRON HCL 4 MG/2ML IJ SOLN: 4.0000 mg | Freq: Once | INTRAMUSCULAR | Status: DC | PRN

## 2017-03-23 MED ORDER — MECLIZINE HCL 25 MG PO TABS
25.0000 mg | ORAL_TABLET | Freq: Two times a day (BID) | ORAL | Status: DC
  Administered 2017-03-23 – 2017-03-27 (×10): 25 mg via ORAL
  Filled 2017-03-23 (×10): qty 1

## 2017-03-23 MED ORDER — ONDANSETRON HCL 4 MG/2ML IJ SOLN
4.0000 mg | Freq: Four times a day (QID) | INTRAMUSCULAR | Status: DC | PRN
  Administered 2017-03-23: 4 mg via INTRAVENOUS
  Filled 2017-03-23: qty 2

## 2017-03-23 MED ORDER — ACETAMINOPHEN 10 MG/ML IV SOLN: 1000.0000 mg | Freq: Once | INTRAVENOUS | Status: DC | PRN

## 2017-03-23 MED ORDER — LIDOCAINE 2% (20 MG/ML) 5 ML SYRINGE
INTRAMUSCULAR | Status: DC | PRN
  Administered 2017-03-23: 80 mg via INTRAVENOUS

## 2017-03-23 MED ORDER — ENOXAPARIN SODIUM 30 MG/0.3ML ~~LOC~~ SOLN
30.0000 mg | SUBCUTANEOUS | Status: DC
  Administered 2017-03-24 – 2017-03-27 (×4): 30 mg via SUBCUTANEOUS
  Filled 2017-03-23 (×5): qty 0.3

## 2017-03-23 MED ORDER — LOSARTAN POTASSIUM 50 MG PO TABS
50.0000 mg | ORAL_TABLET | Freq: Every day | ORAL | Status: DC
  Administered 2017-03-23 – 2017-03-25 (×3): 50 mg via ORAL
  Filled 2017-03-23 (×4): qty 1

## 2017-03-23 MED ORDER — SUCCINYLCHOLINE CHLORIDE 200 MG/10ML IV SOSY
PREFILLED_SYRINGE | INTRAVENOUS | Status: AC
  Filled 2017-03-23: qty 10

## 2017-03-23 MED ORDER — SUGAMMADEX SODIUM 200 MG/2ML IV SOLN
INTRAVENOUS | Status: AC
  Filled 2017-03-23: qty 2

## 2017-03-23 MED ORDER — BUPIVACAINE HCL (PF) 0.5 % IJ SOLN
INTRAMUSCULAR | Status: AC
  Filled 2017-03-23: qty 30

## 2017-03-23 MED ORDER — ROCURONIUM BROMIDE 10 MG/ML (PF) SYRINGE
PREFILLED_SYRINGE | INTRAVENOUS | Status: DC | PRN
  Administered 2017-03-23: 30 mg via INTRAVENOUS

## 2017-03-23 MED ORDER — ALLOPURINOL 100 MG PO TABS
100.0000 mg | ORAL_TABLET | Freq: Every day | ORAL | Status: DC
  Administered 2017-03-24 – 2017-03-27 (×4): 100 mg via ORAL
  Filled 2017-03-23 (×5): qty 1

## 2017-03-23 MED ORDER — CITALOPRAM HYDROBROMIDE 20 MG PO TABS
40.0000 mg | ORAL_TABLET | Freq: Every day | ORAL | Status: DC
  Administered 2017-03-23 – 2017-03-27 (×5): 40 mg via ORAL
  Filled 2017-03-23 (×5): qty 2

## 2017-03-23 MED ORDER — TEMAZEPAM 15 MG PO CAPS
15.0000 mg | ORAL_CAPSULE | Freq: Every day | ORAL | Status: DC
  Administered 2017-03-23 – 2017-03-26 (×5): 15 mg via ORAL
  Filled 2017-03-23 (×5): qty 1

## 2017-03-23 MED ORDER — PROPOFOL 10 MG/ML IV BOLUS
INTRAVENOUS | Status: AC
  Filled 2017-03-23: qty 20

## 2017-03-23 MED ORDER — METOCLOPRAMIDE HCL 5 MG/ML IJ SOLN: 5.0000 mg | Freq: Three times a day (TID) | INTRAMUSCULAR | Status: DC | PRN

## 2017-03-23 MED ORDER — PHENYLEPHRINE 40 MCG/ML (10ML) SYRINGE FOR IV PUSH (FOR BLOOD PRESSURE SUPPORT)
PREFILLED_SYRINGE | INTRAVENOUS | Status: DC | PRN
  Administered 2017-03-23 (×5): 80 ug via INTRAVENOUS

## 2017-03-23 MED ORDER — SODIUM CHLORIDE 0.45 % IV SOLN
INTRAVENOUS | Status: DC
  Administered 2017-03-23: 16:00:00 via INTRAVENOUS

## 2017-03-23 MED ORDER — LACTATED RINGERS IV SOLN
INTRAVENOUS | Status: DC
  Administered 2017-03-23: 13:00:00 via INTRAVENOUS

## 2017-03-23 MED ORDER — VITAMIN D 1000 UNITS PO TABS
1000.0000 [IU] | ORAL_TABLET | Freq: Every day | ORAL | Status: DC
  Administered 2017-03-23 – 2017-03-27 (×5): 1000 [IU] via ORAL
  Filled 2017-03-23 (×6): qty 1

## 2017-03-23 MED ORDER — DILTIAZEM HCL ER 60 MG PO CP12
120.0000 mg | ORAL_CAPSULE | Freq: Every day | ORAL | Status: DC
  Administered 2017-03-24 – 2017-03-27 (×4): 120 mg via ORAL
  Filled 2017-03-23 (×5): qty 2

## 2017-03-23 MED ORDER — DM-GUAIFENESIN ER 30-600 MG PO TB12
1.0000 | ORAL_TABLET | Freq: Two times a day (BID) | ORAL | Status: DC
  Administered 2017-03-23 – 2017-03-27 (×10): 1 via ORAL
  Filled 2017-03-23 (×11): qty 1

## 2017-03-23 MED ORDER — HYDROCODONE-ACETAMINOPHEN 5-325 MG PO TABS: 1.0000 | ORAL_TABLET | Freq: Four times a day (QID) | ORAL | Status: DC | PRN

## 2017-03-23 MED ORDER — ONDANSETRON HCL 4 MG PO TABS: 4.0000 mg | ORAL_TABLET | Freq: Four times a day (QID) | ORAL | Status: DC | PRN

## 2017-03-23 MED ORDER — FENTANYL CITRATE (PF) 100 MCG/2ML IJ SOLN
INTRAMUSCULAR | Status: AC
  Filled 2017-03-23: qty 4

## 2017-03-23 MED ORDER — DOCUSATE SODIUM 100 MG PO CAPS
100.0000 mg | ORAL_CAPSULE | Freq: Two times a day (BID) | ORAL | Status: DC
  Administered 2017-03-23 – 2017-03-25 (×4): 100 mg via ORAL
  Filled 2017-03-23 (×4): qty 1

## 2017-03-23 MED ORDER — METOCLOPRAMIDE HCL 5 MG PO TABS: 5.0000 mg | ORAL_TABLET | Freq: Three times a day (TID) | ORAL | Status: DC | PRN

## 2017-03-23 MED ORDER — PHENOL 1.4 % MT LIQD: 1.0000 | OROMUCOSAL | Status: DC | PRN

## 2017-03-23 MED ORDER — INSULIN ASPART 100 UNIT/ML ~~LOC~~ SOLN
0.0000 [IU] | SUBCUTANEOUS | Status: DC
  Administered 2017-03-23 (×2): 2 [IU] via SUBCUTANEOUS
  Administered 2017-03-23 – 2017-03-24 (×3): 1 [IU] via SUBCUTANEOUS
  Administered 2017-03-24: 2 [IU] via SUBCUTANEOUS
  Administered 2017-03-24: 3 [IU] via SUBCUTANEOUS
  Administered 2017-03-24: 1 [IU] via SUBCUTANEOUS
  Administered 2017-03-24 – 2017-03-25 (×2): 2 [IU] via SUBCUTANEOUS
  Administered 2017-03-25 (×3): 1 [IU] via SUBCUTANEOUS
  Administered 2017-03-25 – 2017-03-26 (×2): 2 [IU] via SUBCUTANEOUS
  Administered 2017-03-26 (×3): 1 [IU] via SUBCUTANEOUS
  Filled 2017-03-23 (×2): qty 1

## 2017-03-23 MED ORDER — FENTANYL CITRATE (PF) 100 MCG/2ML IJ SOLN
INTRAMUSCULAR | Status: DC | PRN
  Administered 2017-03-23: 50 ug via INTRAVENOUS

## 2017-03-23 MED ORDER — SUCCINYLCHOLINE CHLORIDE 200 MG/10ML IV SOSY
PREFILLED_SYRINGE | INTRAVENOUS | Status: DC | PRN
  Administered 2017-03-23: 80 mg via INTRAVENOUS

## 2017-03-23 MED ORDER — PRAVASTATIN SODIUM 20 MG PO TABS
20.0000 mg | ORAL_TABLET | Freq: Every day | ORAL | Status: DC
  Administered 2017-03-23 – 2017-03-26 (×4): 20 mg via ORAL
  Filled 2017-03-23 (×4): qty 1

## 2017-03-23 MED ORDER — ACETAMINOPHEN 325 MG PO TABS
650.0000 mg | ORAL_TABLET | Freq: Four times a day (QID) | ORAL | Status: DC | PRN
  Administered 2017-03-23 – 2017-03-27 (×4): 650 mg via ORAL
  Filled 2017-03-23 (×4): qty 2

## 2017-03-23 MED ORDER — MORPHINE SULFATE (PF) 2 MG/ML IV SOLN: 0.5000 mg | INTRAVENOUS | Status: DC | PRN

## 2017-03-23 MED ORDER — MENTHOL 3 MG MT LOZG: 1.0000 | LOZENGE | OROMUCOSAL | Status: DC | PRN

## 2017-03-23 MED ORDER — ACETAMINOPHEN 650 MG RE SUPP: 650.0000 mg | Freq: Four times a day (QID) | RECTAL | Status: DC | PRN

## 2017-03-23 MED ORDER — PHENYLEPHRINE 40 MCG/ML (10ML) SYRINGE FOR IV PUSH (FOR BLOOD PRESSURE SUPPORT)
PREFILLED_SYRINGE | INTRAVENOUS | Status: AC
  Filled 2017-03-23: qty 10

## 2017-03-23 MED ORDER — CHLORHEXIDINE GLUCONATE 4 % EX LIQD
60.0000 mL | Freq: Once | CUTANEOUS | Status: DC
  Filled 2017-03-23: qty 60

## 2017-03-23 MED ORDER — CLINDAMYCIN PHOSPHATE 900 MG/50ML IV SOLN
900.0000 mg | INTRAVENOUS | Status: AC
  Administered 2017-03-23: 900 mg via INTRAVENOUS
  Filled 2017-03-23 (×2): qty 50

## 2017-03-23 MED ORDER — FENTANYL CITRATE (PF) 100 MCG/2ML IJ SOLN
INTRAMUSCULAR | Status: AC
  Filled 2017-03-23: qty 2

## 2017-03-23 MED ORDER — ONDANSETRON HCL 4 MG/2ML IJ SOLN
INTRAMUSCULAR | Status: AC
  Filled 2017-03-23: qty 2

## 2017-03-23 MED ORDER — LIDOCAINE 2% (20 MG/ML) 5 ML SYRINGE
INTRAMUSCULAR | Status: AC
  Filled 2017-03-23: qty 5

## 2017-03-23 MED ORDER — FENTANYL CITRATE (PF) 100 MCG/2ML IJ SOLN: 25.0000 ug | INTRAMUSCULAR | Status: DC | PRN

## 2017-03-23 MED ORDER — POLYSACCHARIDE IRON COMPLEX 150 MG PO CAPS
150.0000 mg | ORAL_CAPSULE | Freq: Two times a day (BID) | ORAL | Status: DC
  Administered 2017-03-23 – 2017-03-27 (×10): 150 mg via ORAL
  Filled 2017-03-23 (×11): qty 1

## 2017-03-23 MED ORDER — ROCURONIUM BROMIDE 10 MG/ML (PF) SYRINGE
PREFILLED_SYRINGE | INTRAVENOUS | Status: AC
  Filled 2017-03-23: qty 5

## 2017-03-23 MED ORDER — SUGAMMADEX SODIUM 200 MG/2ML IV SOLN
INTRAVENOUS | Status: DC | PRN
  Administered 2017-03-23: 200 mg via INTRAVENOUS

## 2017-03-23 MED ORDER — FLUTICASONE PROPIONATE 50 MCG/ACT NA SUSP
2.0000 | Freq: Every day | NASAL | Status: DC
  Administered 2017-03-24 – 2017-03-27 (×4): 2 via NASAL
  Filled 2017-03-23: qty 16

## 2017-03-23 MED ORDER — BUPIVACAINE HCL (PF) 0.5 % IJ SOLN
INTRAMUSCULAR | Status: DC | PRN
  Administered 2017-03-23: 10 mL

## 2017-03-23 MED ORDER — FUROSEMIDE 40 MG PO TABS: 80.0000 mg | ORAL_TABLET | Freq: Every day | ORAL | Status: DC

## 2017-03-23 MED ORDER — POVIDONE-IODINE 10 % EX SWAB: 2.0000 "application " | Freq: Once | CUTANEOUS | Status: DC

## 2017-03-23 MED ORDER — BUPROPION HCL ER (XL) 150 MG PO TB24
150.0000 mg | ORAL_TABLET | Freq: Every day | ORAL | Status: DC
  Administered 2017-03-23 – 2017-03-27 (×5): 150 mg via ORAL
  Filled 2017-03-23 (×5): qty 1

## 2017-03-23 SURGICAL SUPPLY — 37 items
BAG SPEC THK2 15X12 ZIP CLS (MISCELLANEOUS)
BAG ZIPLOCK 12X15 (MISCELLANEOUS) ×1 IMPLANT
BANDAGE ESMARK 6X9 LF (GAUZE/BANDAGES/DRESSINGS) ×1 IMPLANT
BIT DRILL 4.8X200 CANN (BIT) ×1 IMPLANT
BNDG CMPR 9X6 STRL LF SNTH (GAUZE/BANDAGES/DRESSINGS) ×1
BNDG ESMARK 6X9 LF (GAUZE/BANDAGES/DRESSINGS) ×2
BNDG GAUZE ELAST 4 BULKY (GAUZE/BANDAGES/DRESSINGS) ×2 IMPLANT
COVER SURGICAL LIGHT HANDLE (MISCELLANEOUS) ×2 IMPLANT
DRAPE STERI IOBAN 125X83 (DRAPES) ×2 IMPLANT
DRSG MEPILEX BORDER 4X4 (GAUZE/BANDAGES/DRESSINGS) ×2 IMPLANT
DRSG MEPILEX BORDER 4X8 (GAUZE/BANDAGES/DRESSINGS) ×2 IMPLANT
DRSG PAD ABDOMINAL 8X10 ST (GAUZE/BANDAGES/DRESSINGS) ×2 IMPLANT
DURAPREP 26ML APPLICATOR (WOUND CARE) ×2 IMPLANT
ELECT REM PT RETURN 15FT ADLT (MISCELLANEOUS) ×2 IMPLANT
FACESHIELD WRAPAROUND (MASK) ×4 IMPLANT
FACESHIELD WRAPAROUND OR TEAM (MASK) ×2 IMPLANT
GAUZE SPONGE 4X4 12PLY STRL (GAUZE/BANDAGES/DRESSINGS) ×2 IMPLANT
GLOVE ORTHO TXT STRL SZ7.5 (GLOVE) ×12 IMPLANT
GOWN STRL REUS W/TWL LRG LVL3 (GOWN DISPOSABLE) ×3 IMPLANT
NS IRRIG 1000ML POUR BTL (IV SOLUTION) ×2 IMPLANT
PACK GENERAL/GYN (CUSTOM PROCEDURE TRAY) ×2 IMPLANT
PAD CAST 4YDX4 CTTN HI CHSV (CAST SUPPLIES) ×1 IMPLANT
PADDING CAST COTTON 4X4 STRL (CAST SUPPLIES) ×2
PADDING CAST COTTON 6X4 STRL (CAST SUPPLIES) ×2 IMPLANT
PIN GUIDE DRILL TIP 2.8X300 (DRILL) ×3 IMPLANT
POSITIONER SURGICAL ARM (MISCELLANEOUS) ×2 IMPLANT
SCREW CANN 6.5 100 (Screw) ×4 IMPLANT
SCREW CANN 6.5 105 (Screw) ×2 IMPLANT
STAPLER SKIN PROX WIDE 3.9 (STAPLE) ×2 IMPLANT
STAPLER VISISTAT 35W (STAPLE) ×2 IMPLANT
SUT VIC AB 1 CT1 27 (SUTURE) ×2
SUT VIC AB 1 CT1 27XBRD ANTBC (SUTURE) ×2 IMPLANT
SUT VIC AB 2-0 CT1 27 (SUTURE) ×2
SUT VIC AB 2-0 CT1 TAPERPNT 27 (SUTURE) ×2 IMPLANT
TOWEL OR 17X26 10 PK STRL BLUE (TOWEL DISPOSABLE) ×2 IMPLANT
TRAY FOLEY W/METER SILVER 16FR (SET/KITS/TRAYS/PACK) ×1 IMPLANT
WATER STERILE IRR 1000ML POUR (IV SOLUTION) ×2 IMPLANT

## 2017-03-23 NOTE — H&P (View-Only) (Signed)
Reason for Consult: Right impacted femoral neck fracture Referring Physician: Denton Brick MD  Joel [name] is an 84 y.o. male.  HPI: 84 year old male lives at home with his dog and has some and walk his dog on a daily basis fell at home.  He has severe knee osteoarthritis and due to multiple medical problems has not been able to be cleared from surgery for his knee replacements.  He has had some problems with his right hip off and on and fell, unable to walk was brought to emergency room where x-rays demonstrated impacted femoral neck fracture.  Past Medical History:  Diagnosis Date  . Allergic rhinitis   . Anemia   . Anxiety   . Aortic stenosis 2012   mild to mod   . Atrial fibrillation ([place]) 1991   with multiple DCCV  . Bipolar affective disorder ([place])   . COPD (chronic obstructive pulmonary disease) ([place])   . Diabetic neuropathy ([place])    in feet  . Diabetic neuropathy ([place])   . Dyslipidemia   . Fatigue    last year or so  . Gout   . Hypertension   . Insomnia   . Obesity   . On home oxygen therapy 2 1/2 liters with bipap at night  . OSA (obstructive sleep apnea)    severe, uses bipap 16 1/2 by 12 or 13 setting  . Prostate cancer ([place])   . Rectal bleeding 12/16/2015  . Type 2 diabetes mellitus ([place])   . Vertigo     Past Surgical History:  Procedure Laterality Date  . CARDIOVERSION  yrs ago   prior to 1998  . COLONOSCOPY WITH PROPOFOL N/A 01/23/2013   Procedure: COLONOSCOPY WITH PROPOFOL;  Surgeon: Garlan Fair, MD;  [facility];  Service: Endoscopy;  Laterality: N/A;  . electro shock  1969   for depression  . ENTEROSCOPY Left 05/17/2016   Procedure: ENTEROSCOPY;  Surgeon: Wilford Corner, MD;  [facility];  Service: Endoscopy;  Laterality: Left;  . ESOPHAGOGASTRODUODENOSCOPY N/A 03/03/2016   Procedure: ESOPHAGOGASTRODUODENOSCOPY (EGD);  Surgeon: Teena Irani, MD;  Location: [facility];  Service: Endoscopy;  Laterality: N/A;  .  ESOPHAGOGASTRODUODENOSCOPY N/A 03/26/2016   Procedure: ESOPHAGOGASTRODUODENOSCOPY (EGD);  Surgeon: Teena Irani, MD;  Location: [facility] ENDOSCOPY;  Service: Endoscopy;  Laterality: N/A;  would like to use ultraslim scope  . ESOPHAGOGASTRODUODENOSCOPY (EGD) WITH PROPOFOL N/A 01/23/2013   Procedure: ESOPHAGOGASTRODUODENOSCOPY (EGD) WITH PROPOFOL;  Surgeon: Garlan Fair, MD;  [facility];  Service: Endoscopy;  Laterality: N/A;  . ESOPHAGOGASTRODUODENOSCOPY (EGD) WITH PROPOFOL N/A 04/13/2016   Procedure: ESOPHAGOGASTRODUODENOSCOPY (EGD) WITH PROPOFOL;  Surgeon: Otis Brace, MD;  Location: [place];  Service: Gastroenterology;  Laterality: N/A;  . GIVENS CAPSULE STUDY N/A 03/03/2016   Procedure: GIVENS CAPSULE STUDY;  Surgeon: Teena Irani, MD;  [facility];  Service: Endoscopy;  Laterality: N/A;  . HEMORRHOID SURGERY N/A 12/16/2015   Procedure: PROCTOSCOPY WITH CONTROL OF BLEEDING;  Surgeon: Fanny Skates, MD;  Location: [place];  Service: General;  Laterality: N/A;  . KNEE SURGERY  1970's   rt  . MAZE  1998  . PROSTATE BIOPSY    . SHOULDER SURGERY  7-8 yrs ago   rt    Family History  Problem Relation Age of Onset  . Tuberculosis Maternal Grandmother   . Heart attack Neg Hx   . Diabetes Neg Hx   . CAD Neg Hx   . Cancer Neg Hx     Social History:  reports  that he quit smoking about 40 years ago. His smoking use included cigarettes. He has a 50.00 pack-year smoking history. he has never used smokeless tobacco. He reports that he does not drink alcohol or use drugs.  Allergies:  Allergies  Allergen Reactions  . Penicillins Anaphylaxis    Has patient had a PCN reaction causing immediate rash, facial/tongue/throat swelling, SOB or lightheadedness with hypotension: unknown Has patient had a PCN reaction causing severe rash involving mucus membranes or skin necrosis: unknown Has patient had a PCN reaction that required hospitalization: unknown Has patient had a PCN  reaction occurring within the last 10 years: no If all of the above answers are "NO", then may proceed with Cephalosporin use.   Marland Kitchen Antihistamines, Loratadine-Type Other (See Comments)    depression  . Other     Beta blockers-Novacaine  Caused depression    Medications: I have reviewed the patient's current medications.  Results for orders placed or performed during the hospital encounter of 03/22/17 (from the past 48 hour(s))  CBC with Differential     Status: Abnormal   Collection Time: 03/22/17 10:43 PM  Result Value Ref Range   WBC 9.1 4.0 - 10.5 K/uL   RBC 3.29 (L) 4.22 - 5.81 MIL/uL   Hemoglobin 9.5 (L) 13.0 - 17.0 g/dL   HCT 30.9 (L) 39.0 - 52.0 %   MCV 93.9 78.0 - 100.0 fL   MCH 28.9 26.0 - 34.0 pg   MCHC 30.7 30.0 - 36.0 g/dL   RDW 15.5 11.5 - 15.5 %   Platelets 165 150 - 400 K/uL   Neutrophils Relative % 89 %   Neutro Abs 8.0 (H) 1.7 - 7.7 K/uL   Lymphocytes Relative 4 %   Lymphs Abs 0.4 (L) 0.7 - 4.0 K/uL   Monocytes Relative 7 %   Monocytes Absolute 0.6 0.1 - 1.0 K/uL   Eosinophils Relative 0 %   Eosinophils Absolute 0.0 0.0 - 0.7 K/uL   Basophils Relative 0 %   Basophils Absolute 0.0 0.0 - 0.1 K/uL    Comment: Performed at [facility], [place] [address] [place], [place] [address]  Basic metabolic panel     Status: Abnormal   Collection Time: 03/22/17 10:43 PM  Result Value Ref Range   Sodium 140 135 - 145 mmol/L   Potassium 4.1 3.5 - 5.1 mmol/L   Chloride 106 101 - 111 mmol/L   CO2 23 22 - 32 mmol/L   Glucose, Bld 184 (H) 65 - 99 mg/dL   BUN 18 6 - 20 mg/dL   Creatinine, Ser 1.39 (H) 0.61 - 1.24 mg/dL   Calcium 8.8 (L) 8.9 - 10.3 mg/dL   GFR calc non Af Amer 46 (L) >60 mL/min   GFR calc Af Amer 53 (L) >60 mL/min    Comment: (NOTE) The eGFR has been calculated using the CKD EPI equation. This calculation has not been validated in all clinical situations. eGFR's persistently <60 mL/min signify possible Chronic Kidney Disease.    Anion  gap 11 5 - 15    Comment: Performed at [facility], [place] [address], [place], [place] [address]  CBG monitoring, ED     Status: Abnormal   Collection Time: 03/23/17  1:17 AM  Result Value Ref Range   Glucose-Capillary 154 (H) 65 - 99 mg/dL  Type and screen [place]     Status: None   Collection Time: 03/23/17  1:29 AM  Result Value Ref Range  ABO/RH(D) O POS    Antibody Screen NEG    Sample Expiration      03/26/2017 Performed at [facility], [place] [address] [place], [place] [address]   CBG monitoring, ED     Status: Abnormal   Collection Time: 03/23/17  7:39 AM  Result Value Ref Range   Glucose-Capillary 150 (H) 65 - 99 mg/dL  Glucose, capillary     Status: Abnormal   Collection Time: 03/23/17 10:38 AM  Result Value Ref Range   Glucose-Capillary 132 (H) 65 - 99 mg/dL    Ct Hip Right Wo Contrast  Result Date: 03/23/2017 CLINICAL DATA:  Right hip pain after fall yesterday. EXAM: CT OF THE RIGHT HIP WITHOUT CONTRAST TECHNIQUE: Multidetector CT imaging of the right hip was performed according to the standard protocol. Multiplanar CT image reconstructions were also generated. COMPARISON:  Right hip x-rays from yesterday. FINDINGS: Bones/Joint/Cartilage Again seen is a nondisplaced, impacted subcapital femoral neck fracture. No additional fracture is seen. Moderate right hip joint space narrowing with subchondral sclerosis and cystic change, most prominent along the posterior aspect. Ligaments Suboptimally assessed by CT. Muscles and Tendons Unremarkable. Soft tissues Mild soft tissue swelling lateral to the greater trochanter. Vascular calcifications. The visualized intrapelvic structures are unremarkable. IMPRESSION: Acute nondisplaced, impacted subcapital femoral neck fracture. Electronically Signed   By: Titus Dubin M.D.   On: 03/23/2017 09:48   Dg Chest Portable 1 View  Result Date: 03/22/2017 CLINICAL DATA:  Fall today.   Preop, right hip fracture. EXAM: PORTABLE CHEST 1 VIEW COMPARISON:  01/18/2017 FINDINGS: Post median sternotomy. Chronic cardiomegaly. Chronic interstitial coarsening. Vascular congestion which is new. No confluent consolidation. No pleural fluid or pneumothorax. No acute osseous abnormalities. Chronic widening of right acromioclavicular joint. IMPRESSION: 1. Chronic cardiomegaly and interstitial coarsening. 2. Vascular congestion, new. Electronically Signed   By: Jeb Levering M.D.   On: 03/22/2017 23:09   Dg Hip Unilat  With Pelvis 2-3 Views Right  Result Date: 03/22/2017 CLINICAL DATA:  Trip and fall at home this evening, right hip pain. EXAM: DG HIP (WITH OR WITHOUT PELVIS) 2-3V RIGHT COMPARISON:  None. FINDINGS: Impacted subcapital right femoral neck fracture. Femoral head remains seated. No additional acute fracture of the pelvis. Pubic symphysis and sacroiliac joints are congruent. IMPRESSION: Impacted subcapital right femoral neck fracture. Electronically Signed   By: Jeb Levering M.D.   On: 03/22/2017 22:02    ROS positive for aortic stenosis history of atrial fibrillation.  Bipolar disorder, COPD, diabetes with neuropathy, gout, hypertension, obesity, home oxygen.  Positive for dyslipidemia, history of prostate cancer, vertigo.  Low back pain.  Otherwise negative as it pertains to HPI. Blood pressure 123/66, pulse 79, temperature 98 F (36.7 C), temperature source Oral, resp. rate 18, height 6' (1.829 m), weight 234 lb 5.6 oz (106.3 kg), SpO2 91 %. Physical Exam  Constitutional: He is oriented to person, place, and time. He appears well-developed and well-nourished.  HENT:  Head: Normocephalic.  Eyes: Pupils are equal, round, and reactive to light.  Neck: Normal range of motion.  Respiratory: Effort normal.  GI: Soft.  Musculoskeletal:  Patient has palpable pulses sciatic sensation is intact right left leg.  He can do straight leg raise on the right with minimal groin pain.    Neurological: He is alert and oriented to person, place, and time.  Skin: Skin is warm and dry.  Psychiatric: He has a normal mood and affect. His behavior is normal.    Assessment/Plan: We will  proceed with pin fixation for his impacted femoral neck fracture.  Patient has multiple medical risk will need to go to skilled nursing facility and will be bed to chair transfers for period of 6 weeks after surgery before he can begin weightbearing.  Patient has dyspnea with ambulation only to his mailbox which is 80 feet away.  He has complex multiple medical problems which makes him increase surgical risk and pin fixation of his femoral neck fracture because minimal blood loss and should be the safest treatment option.  Marybelle Killings 03/23/2017, 11:05 AM

## 2017-03-23 NOTE — Consult Note (Signed)
 Reason for Consult: Right impacted femoral neck fracture Referring Physician: Emokpae MD  Joel [name] is an 84 y.o. male.  HPI: 84-year-old male lives at home with his dog and has some and walk his dog on a daily basis fell at home.  He has severe knee osteoarthritis and due to multiple medical problems has not been able to be cleared from surgery for his knee replacements.  He has had some problems with his right hip off and on and fell, unable to walk was brought to emergency room where x-rays demonstrated impacted femoral neck fracture.  Past Medical History:  Diagnosis Date  . Allergic rhinitis   . Anemia   . Anxiety   . Aortic stenosis 2012   mild to mod   . Atrial fibrillation ([facility]) 1991   with multiple DCCV  . Bipolar affective disorder ([facility])   . COPD (chronic obstructive pulmonary disease) ([facility])   . Diabetic neuropathy ([facility])    in feet  . Diabetic neuropathy ([facility])   . Dyslipidemia   . Fatigue    last year or so  . Gout   . Hypertension   . Insomnia   . Obesity   . On home oxygen therapy 2 1/2 liters with bipap at night  . OSA (obstructive sleep apnea)    severe, uses bipap 16 1/2 by 12 or 13 setting  . Prostate cancer ([facility])   . Rectal bleeding 12/16/2015  . Type 2 diabetes mellitus ([facility])   . Vertigo     Past Surgical History:  Procedure Laterality Date  . CARDIOVERSION  yrs ago   prior to 1998  . COLONOSCOPY WITH PROPOFOL N/A 01/23/2013   Procedure: COLONOSCOPY WITH PROPOFOL;  Surgeon: Martin K Johnson, MD;  [facility];  Service: Endoscopy;  Laterality: N/A;  . electro shock  1969   for depression  . ENTEROSCOPY Left 05/17/2016   Procedure: ENTEROSCOPY;  Surgeon: Vincent Schooler, MD;  [facility];  Service: Endoscopy;  Laterality: Left;  . ESOPHAGOGASTRODUODENOSCOPY N/A 03/03/2016   Procedure: ESOPHAGOGASTRODUODENOSCOPY (EGD);  Surgeon: John Hayes, MD;  [facility];  Service: Endoscopy;  Laterality: N/A;  .  ESOPHAGOGASTRODUODENOSCOPY N/A 03/26/2016   Procedure: ESOPHAGOGASTRODUODENOSCOPY (EGD);  Surgeon: John Hayes, MD;  [facility];  Service: Endoscopy;  Laterality: N/A;  would like to use ultraslim scope  . ESOPHAGOGASTRODUODENOSCOPY (EGD) WITH PROPOFOL N/A 01/23/2013   Procedure: ESOPHAGOGASTRODUODENOSCOPY (EGD) WITH PROPOFOL;  Surgeon: Martin K Johnson, MD;  [facility];  Service: Endoscopy;  Laterality: N/A;  . ESOPHAGOGASTRODUODENOSCOPY (EGD) WITH PROPOFOL N/A 04/13/2016   Procedure: ESOPHAGOGASTRODUODENOSCOPY (EGD) WITH PROPOFOL;  Surgeon: Parag Brahmbhatt, MD;  [facility];  Service: Gastroenterology;  Laterality: N/A;  . GIVENS CAPSULE STUDY N/A 03/03/2016   Procedure: GIVENS CAPSULE STUDY;  Surgeon: John Hayes, MD;  [facility];  Service: Endoscopy;  Laterality: N/A;  . HEMORRHOID SURGERY N/A 12/16/2015   Procedure: PROCTOSCOPY WITH CONTROL OF BLEEDING;  Surgeon: Haywood Ingram, MD;  [facility];  Service: General;  Laterality: N/A;  . KNEE SURGERY  1970's   rt  . MAZE  1998  . PROSTATE BIOPSY    . SHOULDER SURGERY  7-8 yrs ago   rt    Family History  Problem Relation Age of Onset  . Tuberculosis Maternal Grandmother   . Heart attack Neg Hx   . Diabetes Neg Hx   . CAD Neg Hx   . Cancer Neg Hx     Social History:  reports   that he quit smoking about 40 years ago. His smoking use included cigarettes. He has a 50.00 pack-year smoking history. he has never used smokeless tobacco. He reports that he does not drink alcohol or use drugs.  Allergies:  Allergies  Allergen Reactions  . Penicillins Anaphylaxis    Has patient had a PCN reaction causing immediate rash, facial/tongue/throat swelling, SOB or lightheadedness with hypotension: unknown Has patient had a PCN reaction causing severe rash involving mucus membranes or skin necrosis: unknown Has patient had a PCN reaction that required hospitalization: unknown Has patient had a PCN  reaction occurring within the last 10 years: no If all of the above answers are "NO", then may proceed with Cephalosporin use.   . Antihistamines, Loratadine-Type Other (See Comments)    depression  . Other     Beta blockers-Novacaine  Caused depression    Medications: I have reviewed the patient's current medications.  Results for orders placed or performed during the hospital encounter of 03/22/17 (from the past 48 hour(s))  CBC with Differential     Status: Abnormal   Collection Time: 03/22/17 10:43 PM  Result Value Ref Range   WBC 9.1 4.0 - 10.5 K/uL   RBC 3.29 (L) 4.22 - 5.81 MIL/uL   Hemoglobin 9.5 (L) 13.0 - 17.0 g/dL   HCT 30.9 (L) 39.0 - 52.0 %   MCV 93.9 78.0 - 100.0 fL   MCH 28.9 26.0 - 34.0 pg   MCHC 30.7 30.0 - 36.0 g/dL   RDW 15.5 11.5 - 15.5 %   Platelets 165 150 - 400 K/uL   Neutrophils Relative % 89 %   Neutro Abs 8.0 (H) 1.7 - 7.7 K/uL   Lymphocytes Relative 4 %   Lymphs Abs 0.4 (L) 0.7 - 4.0 K/uL   Monocytes Relative 7 %   Monocytes Absolute 0.6 0.1 - 1.0 K/uL   Eosinophils Relative 0 %   Eosinophils Absolute 0.0 0.0 - 0.7 K/uL   Basophils Relative 0 %   Basophils Absolute 0.0 0.0 - 0.1 K/uL    Comment: Performed at [facility], [address] [place], [place] [address]  Basic metabolic panel     Status: Abnormal   Collection Time: 03/22/17 10:43 PM  Result Value Ref Range   Sodium 140 135 - 145 mmol/L   Potassium 4.1 3.5 - 5.1 mmol/L   Chloride 106 101 - 111 mmol/L   CO2 23 22 - 32 mmol/L   Glucose, Bld 184 (H) 65 - 99 mg/dL   BUN 18 6 - 20 mg/dL   Creatinine, Ser 1.39 (H) 0.61 - 1.24 mg/dL   Calcium 8.8 (L) 8.9 - 10.3 mg/dL   GFR calc non Af Amer 46 (L) >60 mL/min   GFR calc Af Amer 53 (L) >60 mL/min    Comment: (NOTE) The eGFR has been calculated using the CKD EPI equation. This calculation has not been validated in all clinical situations. eGFR's persistently <60 mL/min signify possible Chronic Kidney Disease.    Anion  gap 11 5 - 15    Comment: Performed at [facility], [address] , [place] [address]  CBG monitoring, ED     Status: Abnormal   Collection Time: 03/23/17  1:17 AM  Result Value Ref Range   Glucose-Capillary 154 (H) 65 - 99 mg/dL  Type and screen [facility]     Status: None   Collection Time: 03/23/17  1:29 AM  Result Value Ref Range     ABO/RH(D) O POS    Antibody Screen NEG    Sample Expiration      03/26/2017 Performed at [facility], [address] [place], [place] [address]   CBG monitoring, ED     Status: Abnormal   Collection Time: 03/23/17  7:39 AM  Result Value Ref Range   Glucose-Capillary 150 (H) 65 - 99 mg/dL  Glucose, capillary     Status: Abnormal   Collection Time: 03/23/17 10:38 AM  Result Value Ref Range   Glucose-Capillary 132 (H) 65 - 99 mg/dL    Ct Hip Right Wo Contrast  Result Date: 03/23/2017 CLINICAL DATA:  Right hip pain after fall yesterday. EXAM: CT OF THE RIGHT HIP WITHOUT CONTRAST TECHNIQUE: Multidetector CT imaging of the right hip was performed according to the standard protocol. Multiplanar CT image reconstructions were also generated. COMPARISON:  Right hip x-rays from yesterday. FINDINGS: Bones/Joint/Cartilage Again seen is a nondisplaced, impacted subcapital femoral neck fracture. No additional fracture is seen. Moderate right hip joint space narrowing with subchondral sclerosis and cystic change, most prominent along the posterior aspect. Ligaments Suboptimally assessed by CT. Muscles and Tendons Unremarkable. Soft tissues Mild soft tissue swelling lateral to the greater trochanter. Vascular calcifications. The visualized intrapelvic structures are unremarkable. IMPRESSION: Acute nondisplaced, impacted subcapital femoral neck fracture. Electronically Signed   By: William T Derry M.D.   On: 03/23/2017 09:48   Dg Chest Portable 1 View  Result Date: 03/22/2017 CLINICAL DATA:  Fall today.   Preop, right hip fracture. EXAM: PORTABLE CHEST 1 VIEW COMPARISON:  01/18/2017 FINDINGS: Post median sternotomy. Chronic cardiomegaly. Chronic interstitial coarsening. Vascular congestion which is new. No confluent consolidation. No pleural fluid or pneumothorax. No acute osseous abnormalities. Chronic widening of right acromioclavicular joint. IMPRESSION: 1. Chronic cardiomegaly and interstitial coarsening. 2. Vascular congestion, new. Electronically Signed   By: Melanie  Ehinger M.D.   On: 03/22/2017 23:09   Dg Hip Unilat  With Pelvis 2-3 Views Right  Result Date: 03/22/2017 CLINICAL DATA:  Trip and fall at home this evening, right hip pain. EXAM: DG HIP (WITH OR WITHOUT PELVIS) 2-3V RIGHT COMPARISON:  None. FINDINGS: Impacted subcapital right femoral neck fracture. Femoral head remains seated. No additional acute fracture of the pelvis. Pubic symphysis and sacroiliac joints are congruent. IMPRESSION: Impacted subcapital right femoral neck fracture. Electronically Signed   By: Melanie  Ehinger M.D.   On: 03/22/2017 22:02    ROS positive for aortic stenosis history of atrial fibrillation.  Bipolar disorder, COPD, diabetes with neuropathy, gout, hypertension, obesity, home oxygen.  Positive for dyslipidemia, history of prostate cancer, vertigo.  Low back pain.  Otherwise negative as it pertains to HPI. Blood pressure 123/66, pulse 79, temperature 98 F (36.7 C), temperature source Oral, resp. rate 18, height 6' (1.829 m), weight 234 lb 5.6 oz (106.3 kg), SpO2 91 %. Physical Exam  Constitutional: He is oriented to person, place, and time. He appears well-developed and well-nourished.  HENT:  Head: Normocephalic.  Eyes: Pupils are equal, round, and reactive to light.  Neck: Normal range of motion.  Respiratory: Effort normal.  GI: Soft.  Musculoskeletal:  Patient has palpable pulses sciatic sensation is intact right left leg.  He can do straight leg raise on the right with minimal groin pain.    Neurological: He is alert and oriented to person, place, and time.  Skin: Skin is warm and dry.  Psychiatric: He has a normal mood and affect. His behavior is normal.    Assessment/Plan: We will   proceed with pin fixation for his impacted femoral neck fracture.  Patient has multiple medical risk will need to go to skilled nursing facility and will be bed to chair transfers for period of 6 weeks after surgery before he can begin weightbearing.  Patient has dyspnea with ambulation only to his mailbox which is 80 feet away.  He has complex multiple medical problems which makes him increase surgical risk and pin fixation of his femoral neck fracture because minimal blood loss and should be the safest treatment option.  Mark C Yates 03/23/2017, 11:05 AM      

## 2017-03-23 NOTE — Anesthesia Preprocedure Evaluation (Addendum)
Anesthesia Evaluation  Patient identified by MRN, date of birth, ID band Patient awake    Reviewed: Allergy & Precautions, NPO status , Patient's Chart, lab work & pertinent test results  Airway Mallampati: II       Dental no notable dental hx.    Pulmonary sleep apnea , COPD, former smoker,    Pulmonary exam normal        Cardiovascular hypertension, Normal cardiovascular exam+ Valvular Problems/Murmurs AS   Echo 06/29/15 -The estimated ejection fraction was   in the range of 55% to 60%. Wall motion was normal; there were no   regional wall motion abnormalities. The study is not technically   sufficient to allow evaluation of LV diastolic function. - Aortic valve: Moderately calcified annulus. Probably trileaflet;   severely calcified leaflets. There was moderate stenosis. Mean   gradient (S): 16 mm Hg. Peak gradient (S): 26 mm Hg   Neuro/Psych Bipolar Disorder    GI/Hepatic Neg liver ROS,   Endo/Other  diabetes, Type 2  Renal/GU negative Renal ROS     Musculoskeletal   Abdominal   Peds  Hematology   Anesthesia Other Findings   Reproductive/Obstetrics                            Anesthesia Physical Anesthesia Plan  ASA: III  Anesthesia Plan: General   Post-op Pain Management:    Induction: Intravenous  PONV Risk Score and Plan: Treatment may vary due to age or medical condition, Dexamethasone and Ondansetron  Airway Management Planned: Oral ETT  Additional Equipment:   Intra-op Plan:   Post-operative Plan: Extubation in OR  Informed Consent: I have reviewed the patients History and Physical, chart, labs and discussed the procedure including the risks, benefits and alternatives for the proposed anesthesia with the patient or authorized representative who has indicated his/her understanding and acceptance.   Dental advisory given  Plan Discussed with: CRNA and  Anesthesiologist  Anesthesia Plan Comments:         Anesthesia Quick Evaluation

## 2017-03-23 NOTE — Progress Notes (Signed)
Patient ID: Joel [name], male   DOB: 12/18/1932, 84 y.o.   MRN: [id number] Examined pt, reviewed xrays. Not sure he has femoral neck fx. He does have hip OA with some spurring. Recommend CT of hip . My cell [phone]

## 2017-03-23 NOTE — Op Note (Signed)
Preop diagnosis: Right impacted femoral neck fracture  Postop diagnosis: Same  Procedure: Internal fixation right closed impacted femoral neck fracture with cannulated screws x3, 6.5 Biomet titanium, 100 mm and 105 mm  Surgeon: Rodell Perna MD  Anesthesia: General +10 cc Marcaine local  Complications: None  84 year old male with multisystem organ problems including diabetes, oxygen dependent, COPD oxygen dependent, pulmonary hypertension who has 2 bad knees and is not cleared medically for total knee arthroplasty fell and broke his right hip femoral neck impacted.  He is brought to the operating room for stabilization of his hip.  Procedure after induction of general anesthesia patient placed on the Hana table with right foot fixed in the boot.  Leg was taken out to length internally rotated minimal traction C arm was used careful padding to the opposite well leg and timeout with clindamycin prophylaxis.  There was near anatomic position AP and lateral with no angulation.  DuraPrep was used followed by scoring the area with towels Betadine Steri-Drape.  C-arm was used marking the drape over the top of the skin in line with the pin was placed over the top femoral neck up to the head region on top of the skin.  Stab incision was made laterally over the lateral cortex of the femur.  K wire was drilled in the femur passed up the neck with spot fluoroscopy images low in the head and center on lateral.  A second pin was then packed placed multiple more posterior and slightly went anterior into the head.  Therapy and was placed posterior and centered in the head on lateral.  Head was rotated screws were checked all were good length the fracture was compressed and the head of the screws was down on the lateral cortex.  100 105 screws were used.  Irrigation with saline closure of the fascia with interrupted #1 Vicryl 2-0 Vicryl subtendinous tissue skin staple closure Marcaine infiltration 10 cc total and postop  dressing.

## 2017-03-23 NOTE — Anesthesia Procedure Notes (Signed)
Procedure Name: Intubation Date/Time: 03/23/2017 1:39 PM Performed by: Victoriano Lain, CRNA Pre-anesthesia Checklist: Patient identified, Emergency Drugs available, Suction available, Patient being monitored and Timeout performed Patient Re-evaluated:Patient Re-evaluated prior to induction Oxygen Delivery Method: Circle system utilized Preoxygenation: Pre-oxygenation with 100% oxygen Induction Type: IV induction Ventilation: Mask ventilation without difficulty Laryngoscope Size: Mac and 4 Grade View: Grade I Tube type: Oral Tube size: 7.5 mm Number of attempts: 1 Airway Equipment and Method: Stylet Placement Confirmation: ETT inserted through vocal cords under direct vision,  positive ETCO2 and breath sounds checked- equal and bilateral Secured at: 22 cm Tube secured with: Tape Dental Injury: Teeth and Oropharynx as per pre-operative assessment

## 2017-03-23 NOTE — Progress Notes (Signed)
Patient seen and evaluated, chart reviewed, please see EMR for updated orders. Please see full H&P dictated by admitting physician Dr Alcario Drought for same date of service.    1)Rt Hip Fx- ORIF on 03/23/17 by Dr Lorin Mercy  2)DM- Use Novolog/Humalog Sliding scale insulin with Accu-Cheks/Fingersticks as ordered   3)HTN- losartan, cardizen   Patient seen and evaluated, chart reviewed, please see EMR for updated orders. Please see full H&P dictated by admitting physician Dr Alcario Drought for same date of service.

## 2017-03-23 NOTE — Patient Outreach (Signed)
[facility]) Care Management  03/23/2017  Joel [name] October 05, 1932 [id number]   Patient noted to be hospitalized.  Hospital liaison made aware of admit. Hospital liaison will follow up with patient in the hospital when appropriate.  [name], RN, MSN [facility] Care Management Care Management Coordinator Direct Line [phone] Toll Free: [phone]  Fax: [phone]

## 2017-03-23 NOTE — Telephone Encounter (Signed)
Received message from family member that pt fell & broke his hip & is in the hosp & wants to cancel appt 03/24/17.  He will be in rehab for a while.  She reports that his hgb was about the same as last visit here.  Message to Dr Rose Fillers.  Informed caller to call back when she knows more about dates of d/c for further recommendations.

## 2017-03-23 NOTE — H&P (Signed)
History and Physical    Joel [name] IRJ:[id number] DOB: 1932-12-23 DOA: 03/22/2017  PCP: Wenda Low, MD  Patient coming from: Home  I have personally briefly reviewed patient's old medical records in [place]  Chief Complaint: Fall, R hip pain  HPI: Joel [name] is a 84 y.o. 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  Patient presents to the ED with c/o R hip pain after a mechanical fall at home.  Stood up to let dog out, lost balance, fell, landed on R hip.  Severe R hip pain, worse with movement, inability to bear weight.   ED Course: R impacted femoral neck fx.   Review of Systems: As per HPI otherwise 10 point review of systems negative.   Past Medical History:  Diagnosis Date  . Allergic rhinitis   . Anemia   . Anxiety   . Aortic stenosis 2012   mild to mod   . Atrial fibrillation ([place]) 1991   with multiple DCCV  . Bipolar affective disorder ([place])   . COPD (chronic obstructive pulmonary disease) ([place])   . Diabetic neuropathy ([place])    in feet  . Diabetic neuropathy ([place])   . Dyslipidemia   . Fatigue    last year or so  . Gout   . Hypertension   . Insomnia   . Obesity   . On home oxygen therapy 2 1/2 liters with bipap at night  . OSA (obstructive sleep apnea)    severe, uses bipap 16 1/2 by 12 or 13 setting  . Prostate cancer ([place])   . Rectal bleeding 12/16/2015  . Type 2 diabetes mellitus ([place])   . Vertigo     Past Surgical History:  Procedure Laterality Date  . CARDIOVERSION  yrs ago   prior to 1998  . COLONOSCOPY WITH PROPOFOL N/A 01/23/2013   Procedure: COLONOSCOPY WITH PROPOFOL;  Surgeon: Garlan Fair, MD;  Location: [facility] ENDOSCOPY;  Service: Endoscopy;  Laterality: N/A;  . electro shock  1969   for depression  . ENTEROSCOPY Left 05/17/2016   Procedure: ENTEROSCOPY;  Surgeon: Wilford Corner, MD;  Location: [facility]  ENDOSCOPY;  Service: Endoscopy;  Laterality: Left;  . ESOPHAGOGASTRODUODENOSCOPY N/A 03/03/2016   Procedure: ESOPHAGOGASTRODUODENOSCOPY (EGD);  Surgeon: Teena Irani, MD;  Location: [facility];  Service: Endoscopy;  Laterality: N/A;  . ESOPHAGOGASTRODUODENOSCOPY N/A 03/26/2016   Procedure: ESOPHAGOGASTRODUODENOSCOPY (EGD);  Surgeon: Teena Irani, MD;  Location: [facility] ENDOSCOPY;  Service: Endoscopy;  Laterality: N/A;  would like to use ultraslim scope  . ESOPHAGOGASTRODUODENOSCOPY (EGD) WITH PROPOFOL N/A 01/23/2013   Procedure: ESOPHAGOGASTRODUODENOSCOPY (EGD) WITH PROPOFOL;  Surgeon: Garlan Fair, MD;  Location: [facility] ENDOSCOPY;  Service: Endoscopy;  Laterality: N/A;  . ESOPHAGOGASTRODUODENOSCOPY (EGD) WITH PROPOFOL N/A 04/13/2016   Procedure: ESOPHAGOGASTRODUODENOSCOPY (EGD) WITH PROPOFOL;  Surgeon: Otis Brace, MD;  Location: [facility];  Service: Gastroenterology;  Laterality: N/A;  . GIVENS CAPSULE STUDY N/A 03/03/2016   Procedure: GIVENS CAPSULE STUDY;  Surgeon: Teena Irani, MD;  Location: [facility] ENDOSCOPY;  Service: Endoscopy;  Laterality: N/A;  . HEMORRHOID SURGERY N/A 12/16/2015   Procedure: PROCTOSCOPY WITH CONTROL OF BLEEDING;  Surgeon: Fanny Skates, MD;  Location: [facility];  Service: General;  Laterality: N/A;  . KNEE SURGERY  1970's   rt  . MAZE  1998  . PROSTATE BIOPSY    . SHOULDER SURGERY  7-8 yrs ago   rt  reports that he quit smoking about 40 years ago. His smoking use included cigarettes. He has a 50.00 pack-year smoking history. he has never used smokeless tobacco. He reports that he does not drink alcohol or use drugs.  Allergies  Allergen Reactions  . Penicillins Anaphylaxis    Has patient had a PCN reaction causing immediate rash, facial/tongue/throat swelling, SOB or lightheadedness with hypotension: unknown Has patient had a PCN reaction causing severe rash involving mucus membranes or skin necrosis: unknown Has patient had a PCN reaction that required hospitalization:  unknown Has patient had a PCN reaction occurring within the last 10 years: no If all of the above answers are "NO", then may proceed with Cephalosporin use.   Marland Kitchen Antihistamines, Loratadine-Type Other (See Comments)    depression  . Other     Beta blockers-Novacaine  Caused depression    Family History  Problem Relation Age of Onset  . Tuberculosis Maternal Grandmother   . Heart attack Neg Hx   . Diabetes Neg Hx   . CAD Neg Hx   . Cancer Neg Hx      Prior to Admission medications   Medication Sig Start Date End Date Taking? Authorizing Provider  allopurinol (ZYLOPRIM) 100 MG tablet Take 100 mg by mouth daily.    Yes [provider]  Ascorbic Acid (VITAMIN C PO) Take 1 tablet by mouth daily.   Yes [provider]  buPROPion (WELLBUTRIN XL) 150 MG 24 hr tablet Take 1 tablet (150 mg total) by mouth daily. 12/18/15  Yes Ambrose Finland, MD  cholecalciferol (VITAMIN D) 1000 units tablet Take 1,000 Units by mouth daily.   Yes [provider]  citalopram (CELEXA) 40 MG tablet Take 40 mg by mouth daily.    Yes [provider]  Cyanocobalamin (VITAMIN B-12 PO) Take 1 tablet by mouth daily.   Yes [provider]  dextromethorphan-guaiFENesin (MUCINEX DM) 30-600 MG 12hr tablet Take 1 tablet by mouth 2 (two) times daily.   Yes [provider]  diltiazem (CARDIZEM SR) 120 MG 12 hr capsule Take 120 mg by mouth daily.  03/18/17  Yes [provider]  furosemide (LASIX) 80 MG tablet Take 80 mg by mouth daily.  10/22/15  Yes [provider]  glimepiride (AMARYL) 1 MG tablet Take 1 mg by mouth daily with breakfast.  03/01/17  Yes [provider]  iron polysaccharides (NIFEREX) 150 MG capsule Take 1 capsule (150 mg total) by mouth 2 (two) times daily. 10/23/14  Yes Eugenie Filler, MD  losartan (COZAAR) 50 MG tablet Take 50 mg by mouth daily. 02/21/17  Yes [provider]  lovastatin (MEVACOR) 20 MG tablet  Take 20 mg by mouth at bedtime.    Yes [provider]  meclizine (ANTIVERT) 25 MG tablet Take 1 tablet (25 mg total) by mouth 3 (three) times daily as needed for dizziness. Patient taking differently: Take 25 mg by mouth 2 (two) times daily.  01/28/16  Yes Eber Jones, MD  metFORMIN (GLUCOPHAGE) 1000 MG tablet Take 1,000 mg by mouth 2 (two) times daily.  05/17/14  Yes [provider]  Multiple Vitamins-Minerals (VISION FORMULA PO) Take 1 tablet by mouth 2 (two) times daily.   Yes [provider]  potassium chloride SA (K-DUR,KLOR-CON) 20 MEQ tablet Take 20 mEq by mouth daily.  09/10/14  Yes [provider]  temazepam (RESTORIL) 15 MG capsule Take 15 mg by mouth at bedtime.  02/04/16  Yes [provider]  fluticasone (  FLONASE) 50 MCG/ACT nasal spray Place 2 sprays into both nostrils daily. Patient taking differently: Place 2 sprays into both nostrils daily as needed for allergies or rhinitis.  01/27/16   Waynetta Pean, PA-C  ipratropium (ATROVENT) 0.06 % nasal spray instill 2 sprays into each nostril four times a day as needed for allergies 01/18/17   [provider]  OXYGEN Inhale 2 L into the lungs as needed (shortness of breath).     [provider]    Physical Exam: Vitals:   03/22/17 2109 03/22/17 2115 03/22/17 2341  BP:  (!) 138/96 (!) 144/74  Pulse:  86 88  Resp:  16 18  Temp:  98 F (36.7 C)   TempSrc:  Oral   SpO2: 93% 91% 91%    Constitutional: NAD, calm, comfortable Eyes: PERRL, lids and conjunctivae normal ENMT: Mucous membranes are moist. Posterior pharynx clear of any exudate or lesions.Normal dentition.  Neck: normal, supple, no masses, no thyromegaly Respiratory: clear to auscultation bilaterally, no wheezing, no crackles. Normal respiratory effort. No accessory muscle use.  Cardiovascular: Regular rate and rhythm, no murmurs / rubs / gallops. No extremity edema. 2+ pedal pulses. No carotid bruits.    Abdomen: no tenderness, no masses palpated. No hepatosplenomegaly. Bowel sounds positive.  Musculoskeletal: R hip TTP Skin: no rashes, lesions, ulcers. No induration Neurologic: CN 2-12 grossly intact. Sensation intact, DTR normal. Strength 5/5 in all 4.  Psychiatric: Normal judgment and insight. Alert and oriented x 3. Normal mood.    Labs on Admission: I have personally reviewed following labs and imaging studies  CBC: Recent Labs  Lab 03/17/17 1458 03/22/17 2243  WBC 4.2 9.1  NEUTROABS 2.8 8.0*  HGB  --  9.5*  HCT 31.1* 30.9*  MCV 94.2 93.9  PLT 139* 557   Basic Metabolic Panel: Recent Labs  Lab 03/17/17 1458 03/22/17 2243  NA 142 140  K 3.9 4.1  CL 103 106  CO2 28 23  GLUCOSE 140 184*  BUN 17 18  CREATININE 1.38* 1.39*  CALCIUM 9.4 8.8*   GFR: Estimated Creatinine Clearance: 51.9 mL/min (A) (by C-G formula based on SCr of 1.39 mg/dL (H)). Liver Function Tests: Recent Labs  Lab 03/17/17 1458  AST 15  ALT 12  ALKPHOS 71  BILITOT 0.4  PROT 7.2  ALBUMIN 3.9   No results for input(s): LIPASE, AMYLASE in the last 168 hours. No results for input(s): AMMONIA in the last 168 hours. Coagulation Profile: No results for input(s): INR, PROTIME in the last 168 hours. Cardiac Enzymes: No results for input(s): CKTOTAL, CKMB, CKMBINDEX, TROPONINI in the last 168 hours. BNP (last 3 results) No results for input(s): PROBNP in the last 8760 hours. HbA1C: No results for input(s): HGBA1C in the last 72 hours. CBG: No results for input(s): GLUCAP in the last 168 hours. Lipid Profile: No results for input(s): CHOL, HDL, LDLCALC, TRIG, CHOLHDL, LDLDIRECT in the last 72 hours. Thyroid Function Tests: No results for input(s): TSH, T4TOTAL, FREET4, T3FREE, THYROIDAB in the last 72 hours. Anemia Panel: No results for input(s): VITAMINB12, FOLATE, FERRITIN, TIBC, IRON, RETICCTPCT in the last 72 hours. Urine analysis:    Component Value Date/Time   COLORURINE YELLOW  01/27/2016 1730   APPEARANCEUR CLOUDY (A) 01/27/2016 1730   LABSPEC 1.019 01/27/2016 1730   PHURINE 5.0 01/27/2016 1730   GLUCOSEU NEGATIVE 01/27/2016 1730   HGBUR NEGATIVE 01/27/2016 1730   BILIRUBINUR NEGATIVE 01/27/2016 1730   KETONESUR 5 (A) 01/27/2016 1730   PROTEINUR NEGATIVE 01/27/2016  1730   UROBILINOGEN 0.2 10/22/2014 2136   NITRITE NEGATIVE 01/27/2016 1730   LEUKOCYTESUR NEGATIVE 01/27/2016 1730    Radiological Exams on Admission: Dg Chest Portable 1 View  Result Date: 03/22/2017 CLINICAL DATA:  Fall today.  Preop, right hip fracture. EXAM: PORTABLE CHEST 1 VIEW COMPARISON:  01/18/2017 FINDINGS: Post median sternotomy. Chronic cardiomegaly. Chronic interstitial coarsening. Vascular congestion which is new. No confluent consolidation. No pleural fluid or pneumothorax. No acute osseous abnormalities. Chronic widening of right acromioclavicular joint. IMPRESSION: 1. Chronic cardiomegaly and interstitial coarsening. 2. Vascular congestion, new. Electronically Signed   By: Jeb Levering M.D.   On: 03/22/2017 23:09   Dg Hip Unilat  With Pelvis 2-3 Views Right  Result Date: 03/22/2017 CLINICAL DATA:  Trip and fall at home this evening, right hip pain. EXAM: DG HIP (WITH OR WITHOUT PELVIS) 2-3V RIGHT COMPARISON:  None. FINDINGS: Impacted subcapital right femoral neck fracture. Femoral head remains seated. No additional acute fracture of the pelvis. Pubic symphysis and sacroiliac joints are congruent. IMPRESSION: Impacted subcapital right femoral neck fracture. Electronically Signed   By: Jeb Levering M.D.   On: 03/22/2017 22:02    EKG: Independently reviewed.  Assessment/Plan Principal Problem:   Closed displaced fracture of right femoral neck (HCC) Active Problems:   DM2 (diabetes mellitus, type 2) (HCC)   COPD mixed type (HCC)   Chronic GI bleeding   Anemia due to chronic blood loss   Persistent atrial fibrillation (HCC)    1. R femoral neck fx - 1. Hip fx  pathway 2. NPO 3. Dr. Lorin Mercy likely going to fix hip tomorrow after noon. 4. Holding lasix 2. OSA - continue CPAP 3. Anemia due to chronic GI bleed - 1. Continue iron 2. Most concerned about when / if they start anticoagulation post-op as DVT ppx 3. Will defer decision regarding this to day team 4. A.Fib 1. Continue cardizem 5. COPD - continue home nebs, PRN O2  DVT prophylaxis: SCDs Code Status: Full Family Communication: No family in room Disposition Plan: Rehab after admit Consults called: Dr. Lorin Mercy Admission status: Admit to inpatient   Shaliah Wann, [place] [facility] Pager [phone]  If 7AM-7PM, please contact day team taking care of patient [email or web address] Password [email or web address]  03/23/2017, 12:20 AM

## 2017-03-23 NOTE — Anesthesia Postprocedure Evaluation (Signed)
Anesthesia Post Note  Patient: Joel [name]  Procedure(s) Performed: CANNULATED HIP PINNING (Right Hip)     Patient location during evaluation: PACU Anesthesia Type: General Level of consciousness: awake and alert Pain management: pain level controlled Vital Signs Assessment: post-procedure vital signs reviewed and stable Respiratory status: spontaneous breathing, nonlabored ventilation, respiratory function stable and patient connected to nasal cannula oxygen Cardiovascular status: blood pressure returned to baseline and stable Postop Assessment: no apparent nausea or vomiting Anesthetic complications: no    Last Vitals:  Vitals:   03/23/17 1445 03/23/17 1500  BP: 135/89 129/76  Pulse: 94 (!) 104  Resp: (!) 27 20  Temp: 36.9 C   SpO2: 96% 90%    Last Pain:  Vitals:   03/23/17 1500  TempSrc:   PainSc: [place] A Aadya Kindler

## 2017-03-23 NOTE — Transfer of Care (Signed)
Immediate Anesthesia Transfer of Care Note  Patient: Joel [name]  Procedure(s) Performed: CANNULATED HIP PINNING (Right Hip)  Patient Location: PACU  Anesthesia Type:General  Level of Consciousness: awake, alert , oriented and patient cooperative  Airway & Oxygen Therapy: Patient Spontanous Breathing and Patient connected to face mask oxygen  Post-op Assessment: Report given to RN, Post -op Vital signs reviewed and stable and Patient moving all extremities  Post vital signs: Reviewed and stable  Last Vitals:  Vitals:   03/23/17 1300 03/23/17 1445  BP: 125/79   Pulse: 84   Resp: 18   Temp: 36.7 C (P) 36.9 C  SpO2: 91%     Last Pain:  Vitals:   03/23/17 1300  TempSrc: Oral  PainSc: 0-No pain         Complications: No apparent anesthesia complications

## 2017-03-23 NOTE — Interval H&P Note (Signed)
History and Physical Interval Note:  03/23/2017 1:09 PM  Joel [name]  has presented today for surgery, with the diagnosis of right impacted femoral neck fracture  The various methods of treatment have been discussed with the patient and family. After consideration of risks, benefits and other options for treatment, the patient has consented to  Procedure(s): CANNULATED HIP PINNING (Right) as a surgical intervention .  The patient's history has been reviewed, patient examined, no change in status, stable for surgery.  I have reviewed the patient's chart and labs.  Questions were answered to the patient's satisfaction.     Marybelle Killings

## 2017-03-24 ENCOUNTER — Encounter (HOSPITAL_COMMUNITY): Payer: Self-pay | Admitting: Orthopaedic Surgery

## 2017-03-24 ENCOUNTER — Other Ambulatory Visit: Payer: PPO

## 2017-03-24 LAB — CBC
HEMATOCRIT: 31.7 % — AB (ref 39.0–52.0)
Hemoglobin: 9.6 g/dL — ABNORMAL LOW (ref 13.0–17.0)
MCH: 29.4 pg (ref 26.0–34.0)
MCHC: 30.3 g/dL (ref 30.0–36.0)
MCV: 96.9 fL (ref 78.0–100.0)
Platelets: 149 10*3/uL — ABNORMAL LOW (ref 150–400)
RBC: 3.27 MIL/uL — ABNORMAL LOW (ref 4.22–5.81)
RDW: 16 % — ABNORMAL HIGH (ref 11.5–15.5)
WBC: 7.1 10*3/uL (ref 4.0–10.5)

## 2017-03-24 LAB — GLUCOSE, CAPILLARY
GLUCOSE-CAPILLARY: 105 mg/dL — AB (ref 65–99)
GLUCOSE-CAPILLARY: 149 mg/dL — AB (ref 65–99)
GLUCOSE-CAPILLARY: 201 mg/dL — AB (ref 65–99)
Glucose-Capillary: 131 mg/dL — ABNORMAL HIGH (ref 65–99)
Glucose-Capillary: 133 mg/dL — ABNORMAL HIGH (ref 65–99)
Glucose-Capillary: 170 mg/dL — ABNORMAL HIGH (ref 65–99)
Glucose-Capillary: 178 mg/dL — ABNORMAL HIGH (ref 65–99)

## 2017-03-24 LAB — BASIC METABOLIC PANEL
ANION GAP: 11 (ref 5–15)
BUN: 19 mg/dL (ref 6–20)
CALCIUM: 8.7 mg/dL — AB (ref 8.9–10.3)
CHLORIDE: 104 mmol/L (ref 101–111)
CO2: 25 mmol/L (ref 22–32)
Creatinine, Ser: 1.41 mg/dL — ABNORMAL HIGH (ref 0.61–1.24)
GFR calc non Af Amer: 45 mL/min — ABNORMAL LOW (ref >60)
GFR, EST AFRICAN AMERICAN: 52 mL/min — AB (ref >60)
Glucose, Bld: 117 mg/dL — ABNORMAL HIGH (ref 65–99)
Potassium: 4.2 mmol/L (ref 3.5–5.1)
Sodium: 140 mmol/L (ref 135–145)

## 2017-03-24 MED ORDER — ENSURE ENLIVE PO LIQD
237.0000 mL | Freq: Two times a day (BID) | ORAL | Status: DC
  Administered 2017-03-24 – 2017-03-27 (×5): 237 mL via ORAL

## 2017-03-24 NOTE — Progress Notes (Addendum)
Initial Nutrition Assessment  DOCUMENTATION CODES:   Obesity unspecified  INTERVENTION:   -Provide Ensure Enlive po BID, each supplement provides 350 kcal and 20 grams of protein -Encourage PO intake  NUTRITION DIAGNOSIS:   Increased nutrient needs related to post-op healing as evidenced by estimated needs.  GOAL:   Patient will meet greater than or equal to 90% of their needs  MONITOR:   PO intake, Supplement acceptance, Weight trends, Skin, Labs, I & O's  REASON FOR ASSESSMENT:   Consult Hip fracture protocol  ASSESSMENT:    84 y.o. 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 Pt typically consumes 1 meal a day. States he may have toast in the morning and then has a "heavy" meal for dinner. At restaurants, pt states he may eat 1/2 of his plate. Pt states he tries to eat less meat, but does consume beans, lentils and peanut butter. Emphasized the importance of protein in healing from his surgery.  Pt has not been eating much since he fell PTA. Pt eating 25-50% of meals.  Pt willing to drink Ensure supplements, will order.  Per chart review, pt's weight is stable.   Medications: Vitamin D tablet daily, Colace capsule BID, IV Zofran PRN Labs reviewed: CBGs: 149-178 GFR: 45   NUTRITION - FOCUSED PHYSICAL EXAM:  Nutrition focused physical exam shows no sign of depletion of muscle mass or body fat.  Diet Order:  Diet Carb Modified Fluid consistency: Thin; Room service appropriate? Yes  EDUCATION NEEDS:   Education needs have been addressed  Skin:  Skin Assessment: Reviewed RN Assessment  Last BM:  2/18  Height:   Ht Readings from Last 1 Encounters:  03/23/17 6' (1.829 m)    Weight:   Wt Readings from Last 1 Encounters:  03/23/17 234 lb 5.6  oz (106.3 kg)    Ideal Body Weight:  80.9 kg  BMI:  Body mass index is 31.78 kg/m.  Estimated Nutritional Needs:   Kcal:  2150-2350  Protein:  120-130g  Fluid:  2L/day  Clayton Bibles, MS, RD, LDN [place] Dietitian Pager: [phone] After Hours Pager: [phone]

## 2017-03-24 NOTE — Progress Notes (Signed)
Patient Demographics:    Joel [name], is a 84 y.o. male, DOB - 16-Jan-1933, CWU:[id number]  Admit date - 03/22/2017   Admitting Physician Etta Quill, DO  Outpatient Primary MD for the patient is Wenda Low, MD  LOS - 2   Chief Complaint  Patient presents with  . Fall  . Hip Pain    Right        Subjective:    Joel [name] today has no fevers, no emesis,  No chest pain, male friend at bedside, questions answered  Assessment  & Plan :    Principal Problem:   Closed displaced fracture of right femoral neck (HCC) Active Problems:   DM2 (diabetes mellitus, type 2) ([place])   COPD mixed type ([place])   Chronic GI bleeding   Anemia due to chronic blood loss   Persistent atrial fibrillation (HCC)   1)Rt Hip Fx- ORIF on 03/23/17 by Dr Lorin Mercy, c/n PT  2)DM- Use Novolog/Humalog Sliding scale insulin with Accu-Cheks/Fingersticks as ordered   3)HTN- losartan 50 mg daily, cardizen SR 120 mg daily  4)Depression-stable, continue Wellbutrin 150 mg daily and Celexa 40 mg daily, continue temazepam 50 mg nightly  5)Dispo-PT eval appreciated awaiting skilled nursing facility placement  Code Status : Full   Disposition Plan  : SNF  Consults  :  Ortho/PT   DVT Prophylaxis  :  Lovenox   Lab Results  Component Value Date   PLT 149 (L) 03/24/2017    Inpatient Medications  Scheduled Meds: . allopurinol  100 mg Oral Daily  . buPROPion  150 mg Oral Daily  . cholecalciferol  1,000 Units Oral Daily  . citalopram  40 mg Oral Daily  . dextromethorphan-guaiFENesin  1 tablet Oral BID  . diltiazem  120 mg Oral Daily  . docusate sodium  100 mg Oral BID  . enoxaparin (LOVENOX) injection  30 mg Subcutaneous Q24H  . feeding supplement (ENSURE ENLIVE)  237 mL Oral BID BM  . fluticasone  2 spray Each Nare Daily  . insulin aspart  0-9 Units Subcutaneous Q4H  . iron polysaccharides  150 mg Oral BID  .  losartan  50 mg Oral Daily  . meclizine  25 mg Oral BID  . pravastatin  20 mg Oral q1800  . temazepam  15 mg Oral QHS   Continuous Infusions: . sodium chloride 50 mL/hr at 03/23/17 1546   PRN Meds:.acetaminophen **OR** acetaminophen, HYDROcodone-acetaminophen, menthol-cetylpyridinium **OR** phenol, metoCLOPramide **OR** metoCLOPramide (REGLAN) injection, morphine injection, ondansetron **OR** ondansetron (ZOFRAN) IV    Anti-infectives (From admission, onward)   Start     Dose/Rate Route Frequency Ordered Stop   03/23/17 1115  clindamycin (CLEOCIN) IVPB 900 mg     900 mg 100 mL/hr over 30 Minutes Intravenous On call to O.R. 03/23/17 1110 03/23/17 1441        Objective:   Vitals:   03/24/17 1004 03/24/17 1353 03/24/17 1359 03/24/17 1748  BP: 120/67 118/89 103/72 115/73  Pulse: 84 88 81 88  Resp: 18   17  Temp: 97.7 F (36.5 C)   98.9 F (37.2 C)  TempSrc: Oral   Oral  SpO2: 99% 92%  90%  Weight:      Height:  Wt Readings from Last 3 Encounters:  03/23/17 106.3 kg (234 lb 5.6 oz)  03/17/17 107.3 kg (236 lb 9.6 oz)  01/17/17 103 kg (227 lb)     Intake/Output Summary (Last 24 hours) at 03/24/2017 1850 Last data filed at 03/24/2017 1819 Gross per 24 hour  Intake 1949.17 ml  Output 1000 ml  Net 949.17 ml     Physical Exam  Gen:- Awake Alert,  In no apparent distress  HEENT:- [place].AT, No sclera icterus Neck-Supple Neck,No JVD,.  Lungs-  CTAB  CV- S1, S2 normal Abd-  +ve B.Sounds, Abd Soft, No tenderness,    Extremity/Skin:- No  edema,   right hip/thigh wound is clean dry and intact Psych-affect is appropriate Neuro-no new focal deficits   Data Review:   Micro Results Recent Results (from the past 240 hour(s))  Surgical pcr screen     Status: None   Collection Time: 03/23/17 11:44 AM  Result Value Ref Range Status   MRSA, PCR NEGATIVE NEGATIVE Final   Staphylococcus aureus NEGATIVE NEGATIVE Final    Comment: (NOTE) The Xpert SA Assay (FDA approved  for NASAL specimens in patients 57 years of age and older), is one component of a comprehensive surveillance program. It is not intended to diagnose infection nor to guide or monitor treatment. Performed at [facility], [place] [address], [place], [place] [address]     Radiology Reports Ct Hip Right Wo Contrast  Result Date: 03/23/2017 CLINICAL DATA:  Right hip pain after fall yesterday. EXAM: CT OF THE RIGHT HIP WITHOUT CONTRAST TECHNIQUE: Multidetector CT imaging of the right hip was performed according to the standard protocol. Multiplanar CT image reconstructions were also generated. COMPARISON:  Right hip x-rays from yesterday. FINDINGS: Bones/Joint/Cartilage Again seen is a nondisplaced, impacted subcapital femoral neck fracture. No additional fracture is seen. Moderate right hip joint space narrowing with subchondral sclerosis and cystic change, most prominent along the posterior aspect. Ligaments Suboptimally assessed by CT. Muscles and Tendons Unremarkable. Soft tissues Mild soft tissue swelling lateral to the greater trochanter. Vascular calcifications. The visualized intrapelvic structures are unremarkable. IMPRESSION: Acute nondisplaced, impacted subcapital femoral neck fracture. Electronically Signed   By: Titus Dubin M.D.   On: 03/23/2017 09:48   Dg Chest Portable 1 View  Result Date: 03/22/2017 CLINICAL DATA:  Fall today.  Preop, right hip fracture. EXAM: PORTABLE CHEST 1 VIEW COMPARISON:  01/18/2017 FINDINGS: Post median sternotomy. Chronic cardiomegaly. Chronic interstitial coarsening. Vascular congestion which is new. No confluent consolidation. No pleural fluid or pneumothorax. No acute osseous abnormalities. Chronic widening of right acromioclavicular joint. IMPRESSION: 1. Chronic cardiomegaly and interstitial coarsening. 2. Vascular congestion, new. Electronically Signed   By: Jeb Levering M.D.   On: 03/22/2017 23:09   Dg C-arm 1-60 Min-no  Report  Result Date: 03/23/2017 Fluoroscopy was utilized by the requesting physician.  No radiographic interpretation.   Dg Hip Unilat  With Pelvis 2-3 Views Right  Result Date: 03/22/2017 CLINICAL DATA:  Trip and fall at home this evening, right hip pain. EXAM: DG HIP (WITH OR WITHOUT PELVIS) 2-3V RIGHT COMPARISON:  None. FINDINGS: Impacted subcapital right femoral neck fracture. Femoral head remains seated. No additional acute fracture of the pelvis. Pubic symphysis and sacroiliac joints are congruent. IMPRESSION: Impacted subcapital right femoral neck fracture. Electronically Signed   By: Jeb Levering M.D.   On: 03/22/2017 22:02     CBC Recent Labs  Lab 03/22/17 2243 03/24/17 0457  WBC 9.1 7.1  HGB 9.5* 9.6*  HCT  30.9* 31.7*  PLT 165 149*  MCV 93.9 96.9  MCH 28.9 29.4  MCHC 30.7 30.3  RDW 15.5 16.0*  LYMPHSABS 0.4*  --   MONOABS 0.6  --   EOSABS 0.0  --   BASOSABS 0.0  --     Chemistries  Recent Labs  Lab 03/22/17 2243 03/24/17 0457  NA 140 140  K 4.1 4.2  CL 106 104  CO2 23 25  GLUCOSE 184* 117*  BUN 18 19  CREATININE 1.39* 1.41*  CALCIUM 8.8* 8.7*   ------------------------------------------------------------------------------------------------------------------ No results for input(s): CHOL, HDL, LDLCALC, TRIG, CHOLHDL, LDLDIRECT in the last 72 hours.  Lab Results  Component Value Date   HGBA1C 5.6 04/12/2016   ------------------------------------------------------------------------------------------------------------------ No results for input(s): TSH, T4TOTAL, T3FREE, THYROIDAB in the last 72 hours.  Invalid input(s): FREET3 ------------------------------------------------------------------------------------------------------------------ No results for input(s): VITAMINB12, FOLATE, FERRITIN, TIBC, IRON, RETICCTPCT in the last 72 hours.  Coagulation profile No results for input(s): INR, PROTIME in the last 168 hours.  No results for input(s):  DDIMER in the last 72 hours.  Cardiac Enzymes No results for input(s): CKMB, TROPONINI, MYOGLOBIN in the last 168 hours.  Invalid input(s): CK ------------------------------------------------------------------------------------------------------------------    Component Value Date/Time   BNP 57.0 06/28/2015 2227     Roxan Hockey M.D on 03/24/2017 at 6:50 PM  Between 7am to 7pm - Pager - [phone]  After 7pm go to [email or web address] - password [email or web address]  [facility] -  Office  [phone]   Voice Recognition Viviann Spare dictation system was used to create this note, attempts have been made to correct errors. Please contact the author with questions and/or clarifications.

## 2017-03-24 NOTE — Progress Notes (Signed)
Physical Therapy Evaluation Patient Details Name: Joel [name] MRN: [id number] DOB: [birth year] Today's Date: 03/24/2017   History of Present Illness  Pt was admitted after a fall and is s/p R cannulated hip pinning.  PMH:  COPD, DM, AFIB,   Clinical Impression  Pt admitted with above diagnosis. Pt currently with functional limitations due to the deficits listed below (see PT Problem List). * Pt will benefit from skilled PT to increase their independence and safety with mobility to allow discharge to the venue listed below.   Pt  Will benefit from SNF level therapies post acute; limited mobility today d/t mild dizziness EOB, BPs charted in flowsheets, slight drop in BP-not truly orthostatic; will continue to follow in acute setting     Follow Up Recommendations SNF    Equipment Recommendations  None recommended by PT(defer to SNF)    Recommendations for Other Services       Precautions / Restrictions Precautions Precautions: Fall Restrictions RLE Weight Bearing: Non weight bearing      Mobility  Bed Mobility Overal bed mobility: Needs Assistance Bed Mobility: Supine to Sit;Sit to Supine     Supine to sit: Mod assist;+2 for physical assistance Sit to supine: Mod assist;+2 for physical assistance   General bed mobility comments: assist for legs and trunk.  Pt used rail to assist and initiated moving legs off bed  Transfers Overall transfer level: Needs assistance Equipment used: Rolling walker (2 wheeled) Transfers: Sit to/from Stand Sit to Stand: Mod assist;+2 physical assistance;From elevated surface         General transfer comment: assist to power up and stabilize. Cues for NWB and UE placement  Ambulation/Gait                Stairs            Wheelchair Mobility    Modified Rankin (Stroke Patients Only)       Balance                                             Pertinent Vitals/Pain Pain Assessment: Faces Faces Pain  Scale: Hurts little more Pain Location: R hip Pain Descriptors / Indicators: Aching;Sore Pain Intervention(s): Monitored during session;Premedicated before session    Home Living Family/patient expects to be discharged to:: Skilled nursing facility Living Arrangements: Alone                    Prior Function Level of Independence: Independent with assistive device(s)         Comments: uses quad cane and RW     Hand Dominance        Extremity/Trunk Assessment   Upper Extremity Assessment Upper Extremity Assessment: Defer to OT evaluation;Overall WFL for tasks assessed            Communication   Communication: HOH  Cognition Arousal/Alertness: Awake/alert Behavior During Therapy: WFL for tasks assessed/performed Overall Cognitive Status: Within Functional Limits for tasks assessed                                        General Comments      Exercises     Assessment/Plan    PT Assessment Patient needs continued PT services  PT Problem List Decreased strength;Decreased activity tolerance;Decreased balance;Decreased  mobility;Decreased knowledge of use of DME;Decreased knowledge of precautions       PT Treatment Interventions DME instruction;Functional mobility training;Therapeutic activities;Therapeutic exercise;Balance training;Patient/family education    PT Goals (Current goals can be found in the Care Plan section)  Acute Rehab PT Goals Patient Stated Goal: go to [facility] for rehab PT Goal Formulation: With patient Time For Goal Achievement: 03/31/17 Potential to Achieve Goals: Good    Frequency Min 3X/week   Barriers to discharge Decreased caregiver support      Co-evaluation   Reason for Co-Treatment: For patient/therapist safety;To address functional/ADL transfers PT goals addressed during session: Mobility/safety with mobility;Proper use of DME OT goals addressed during session: ADL's and self-care       AM-PAC  PT "6 Clicks" Daily Activity  Outcome Measure Difficulty turning over in bed (including adjusting bedclothes, sheets and blankets)?: Unable Difficulty moving from lying on back to sitting on the side of the bed? : Unable Difficulty sitting down on and standing up from a chair with arms (e.g., wheelchair, bedside commode, etc,.)?: Unable Help needed moving to and from a bed to chair (including a wheelchair)?: Total Help needed walking in hospital room?: Total Help needed climbing 3-5 steps with a railing? : Total 6 Click Score: 6    End of Session Equipment Utilized During Treatment: Gait belt Activity Tolerance: Patient tolerated treatment well;Patient limited by fatigue Patient left: in bed;with call bell/phone within reach;with family/visitor present;with nursing/sitter in room;with bed alarm set   PT Visit Diagnosis: Muscle weakness (generalized) (M62.81);Unsteadiness on feet (R26.81)    Time: 1224-8250 PT Time Calculation (min) (ACUTE ONLY): 26 min   Charges:   PT Evaluation $PT Eval Low Complexity: 1 Low     PT G CodesKenyon Ana, PT Pager: [phone] 03/24/2017   [facility] 03/24/2017, 4:46 PM

## 2017-03-24 NOTE — Evaluation (Addendum)
Occupational Therapy Evaluation Patient Details Name: Joel [name] MRN: [id number] DOB: 1932-12-18 Today's Date: 03/24/2017    History of Present Illness Pt was admitted after a fall and is s/p R cannulated hip pinning.  PMH:  COPD, DM, AFIB,    Clinical Impression   This 84 year old man was admitted for the above. At baseline, he is mod I.  He will benefit from continued Ot in acute and at SNF to regain independence with adls.  Goals in acute are for min +2 assistance; he currently need mod +2 for bed mobility and sit to stand.    Follow Up Recommendations  SNF    Equipment Recommendations  3 in 1 bedside commode    Recommendations for Other Services       Precautions / Restrictions Precautions Precautions: Fall Restrictions RLE Weight Bearing: Non weight bearing      Mobility Bed Mobility Overal bed mobility: Needs Assistance Bed Mobility: Supine to Sit;Sit to Supine     Supine to sit: Mod assist;+2 for physical assistance Sit to supine: Mod assist;+2 for physical assistance   General bed mobility comments: assist for legs and trunk.  Pt used rail to assist and initiated moving legs off bed  Transfers Overall transfer level: Needs assistance Equipment used: Rolling walker (2 wheeled) Transfers: Sit to/from Stand Sit to Stand: Mod assist;+2 physical assistance;From elevated surface         General transfer comment: assist to power up and stabilize. Cues for NWB and UE placement    Balance                                           ADL either performed or assessed with clinical judgement   ADL Overall ADL's : Needs assistance/impaired Eating/Feeding: Independent   Grooming: Set up;Sitting   Upper Body Bathing: Set up;Sitting   Lower Body Bathing: Moderate assistance;+2 for physical assistance;Sit to/from stand   Upper Body Dressing : Set up;Sitting   Lower Body Dressing: Total assistance;+2 for physical assistance;Sit to/from  stand                 General ADL Comments: pt took a couple of side hops towards HOB.  Fatiques easily. would benefit from AE; did not introduce this today     Vision         Perception     Praxis      Pertinent Vitals/Pain Pain Assessment: Faces Faces Pain Scale: Hurts little more Pain Location: R hip Pain Descriptors / Indicators: Aching;Sore Pain Intervention(s): Limited activity within patient's tolerance;Monitored during session;Premedicated before session;Repositioned     Hand Dominance     Extremity/Trunk Assessment Upper Extremity Assessment Upper Extremity Assessment: Overall WFL for tasks assessed           Communication Communication Communication: HOH   Cognition Arousal/Alertness: Awake/alert Behavior During Therapy: WFL for tasks assessed/performed Overall Cognitive Status: Within Functional Limits for tasks assessed                                     General Comments   Pt states he got dizzy/weak sitting EOB earlier.  Took BPs in supine and sitting (see vitals).  No c/o dizziness in any position but was getting tired when standing    Exercises  Shoulder Instructions      Home Living Family/patient expects to be discharged to:: Skilled nursing facility Living Arrangements: Alone                                      Prior Functioning/Environment Level of Independence: Independent with assistive device(s)        Comments: uses quad cane and RW        OT Problem List: Decreased strength;Decreased activity tolerance;Decreased knowledge of use of DME or AE;Pain      OT Treatment/Interventions: Self-care/ADL training;DME and/or AE instruction;Patient/family education;Therapeutic activities    OT Goals(Current goals can be found in the care plan section) Acute Rehab OT Goals Patient Stated Goal: go to [facility] home for rehab OT Goal Formulation: With patient Time For Goal Achievement:  04/07/17 Potential to Achieve Goals: Good ADL Goals Pt Will Perform Lower Body Bathing: with min assist;with adaptive equipment;sit to/from stand(2 assist) Pt Will Perform Lower Body Dressing: with min assist;sit to/from stand;with adaptive equipment(+2) Pt Will Transfer to Toilet: with min assist;stand pivot transfer;bedside commode;with +2 assist  OT Frequency: Min 2X/week   Barriers to D/C:            Co-evaluation PT/OT/SLP Co-Evaluation/Treatment: Yes Reason for Co-Treatment: For patient/therapist safety PT goals addressed during session: Mobility/safety with mobility OT goals addressed during session: ADL's and self-care      AM-PAC PT "6 Clicks" Daily Activity     Outcome Measure Help from another person eating meals?: None Help from another person taking care of personal grooming?: A Little Help from another person toileting, which includes using toliet, bedpan, or urinal?: A Lot Help from another person bathing (including washing, rinsing, drying)?: A Lot Help from another person to put on and taking off regular upper body clothing?: A Little Help from another person to put on and taking off regular lower body clothing?: Total 6 Click Score: 15   End of Session    Activity Tolerance: Patient tolerated treatment well Patient left: in bed;with call bell/phone within reach;with bed alarm set;with family/visitor present  OT Visit Diagnosis: Pain Pain - Right/Left: Right Pain - part of body: Hip                Time: 1349-1416 OT Time Calculation (min): 27 min Charges:  OT General Charges $OT Visit: 1 Visit OT Evaluation $OT Eval Low Complexity: 1 Low G-Codes:     [place], OTR/L [phone] 03/24/2017  Jaydon Soroka 03/24/2017, 3:09 PM

## 2017-03-24 NOTE — Progress Notes (Signed)
Anticipating SNF at d/c, awaiting final PT evaluations. If SNF, discharge planning per CSW. [phone]

## 2017-03-24 NOTE — Consult Note (Signed)
   [facility] CM Inpatient Consult   03/24/2017  Joel [name] Mar 28, 1932 [id number]   Made aware of hospitalization by [place] [facility] Telephonic RNCM. Telephonic RNCM has been attempting to reach Joel [name] by phone. Please see chart review tab then encounter for patient outreach details.  Joel [name]  pleasantly agrees to [place] Management services. He still has an active consent on file for [facility] program. Andres Labrum (daughter) is listed as his emergency contact at [phone]. Joel [name] denies any changes needed to his [facility] Care Management consent.   Joel [name] reports the discharge plan is for SNF.  Joel [name] lived alone prior to hospital discharge.  Denies having any concerns with transportation or medication.  Discussed that [facility] has been trying to contact him. Joel [name] confirms his best and only contact number is [phone].  Will make referral to [facility] LCSW once disposition facility is known. Will update Telephonic RNCM as well. Noted pt/ot eval is pending. Inpatient LCSW following disposition.    Marthenia Rolling, MSN-Ed, RN,BSN [facility] Liaison [phone]

## 2017-03-24 NOTE — Progress Notes (Signed)
LCSW following for disposition. LCSW consulted for SNF.   Patient has not been seen by PT/OT at this time. Patient will need pre-auth for SNF.  LCSW will continue to follow.  Joel [name]

## 2017-03-25 ENCOUNTER — Inpatient Hospital Stay (HOSPITAL_COMMUNITY): Payer: PPO

## 2017-03-25 LAB — BASIC METABOLIC PANEL
Anion gap: 11 (ref 5–15)
BUN: 27 mg/dL — AB (ref 6–20)
CHLORIDE: 101 mmol/L (ref 101–111)
CO2: 24 mmol/L (ref 22–32)
CREATININE: 1.49 mg/dL — AB (ref 0.61–1.24)
Calcium: 8.9 mg/dL (ref 8.9–10.3)
GFR calc Af Amer: 49 mL/min — ABNORMAL LOW (ref >60)
GFR calc non Af Amer: 42 mL/min — ABNORMAL LOW (ref >60)
Glucose, Bld: 124 mg/dL — ABNORMAL HIGH (ref 65–99)
Potassium: 4 mmol/L (ref 3.5–5.1)
SODIUM: 136 mmol/L (ref 135–145)

## 2017-03-25 LAB — GLUCOSE, CAPILLARY
GLUCOSE-CAPILLARY: 138 mg/dL — AB (ref 65–99)
GLUCOSE-CAPILLARY: 167 mg/dL — AB (ref 65–99)
Glucose-Capillary: 120 mg/dL — ABNORMAL HIGH (ref 65–99)
Glucose-Capillary: 129 mg/dL — ABNORMAL HIGH (ref 65–99)
Glucose-Capillary: 151 mg/dL — ABNORMAL HIGH (ref 65–99)
Glucose-Capillary: 156 mg/dL — ABNORMAL HIGH (ref 65–99)

## 2017-03-25 LAB — CBC
HCT: 30.6 % — ABNORMAL LOW (ref 39.0–52.0)
HEMOGLOBIN: 9.5 g/dL — AB (ref 13.0–17.0)
MCH: 29.9 pg (ref 26.0–34.0)
MCHC: 31 g/dL (ref 30.0–36.0)
MCV: 96.2 fL (ref 78.0–100.0)
Platelets: 145 10*3/uL — ABNORMAL LOW (ref 150–400)
RBC: 3.18 MIL/uL — ABNORMAL LOW (ref 4.22–5.81)
RDW: 15.8 % — ABNORMAL HIGH (ref 11.5–15.5)
WBC: 5.5 10*3/uL (ref 4.0–10.5)

## 2017-03-25 MED ORDER — SODIUM CHLORIDE 0.9 % IV SOLN
INTRAVENOUS | Status: AC
  Administered 2017-03-25: 13:00:00 via INTRAVENOUS

## 2017-03-25 MED ORDER — SENNOSIDES-DOCUSATE SODIUM 8.6-50 MG PO TABS
2.0000 | ORAL_TABLET | Freq: Two times a day (BID) | ORAL | Status: DC
  Administered 2017-03-25 – 2017-03-27 (×4): 2 via ORAL
  Filled 2017-03-25 (×4): qty 2

## 2017-03-25 MED ORDER — FLEET ENEMA 7-19 GM/118ML RE ENEM
1.0000 | ENEMA | Freq: Once | RECTAL | Status: DC
  Filled 2017-03-25: qty 1

## 2017-03-25 MED ORDER — MAGNESIUM CITRATE PO SOLN
0.5000 | Freq: Once | ORAL | Status: AC
  Administered 2017-03-25: 0.5 via ORAL
  Filled 2017-03-25: qty 296

## 2017-03-25 MED ORDER — BISACODYL 10 MG RE SUPP
10.0000 mg | Freq: Once | RECTAL | Status: AC
  Administered 2017-03-26: 10 mg via RECTAL
  Filled 2017-03-25: qty 1

## 2017-03-25 MED ORDER — HYDROCODONE-ACETAMINOPHEN 5-325 MG PO TABS: 1.0000 | ORAL_TABLET | Freq: Four times a day (QID) | ORAL | 0 refills | Status: DC | PRN

## 2017-03-25 MED ORDER — HYDRALAZINE HCL 20 MG/ML IJ SOLN: 10.0000 mg | Freq: Four times a day (QID) | INTRAMUSCULAR | Status: DC | PRN

## 2017-03-25 MED ORDER — LACTULOSE 10 GM/15ML PO SOLN
30.0000 g | Freq: Once | ORAL | Status: AC
  Administered 2017-03-25: 30 g via ORAL
  Filled 2017-03-25: qty 45

## 2017-03-25 MED ORDER — ENOXAPARIN SODIUM 30 MG/0.3ML ~~LOC~~ SOLN: 30.0000 mg | SUBCUTANEOUS | 0 refills | Status: DC

## 2017-03-25 NOTE — Care Management Important Message (Signed)
Important Message  Patient Details  Name: Joel [name] MRN: [id number] Date of Birth: May 09, 1932   Medicare Important Message Given:  Yes    Kerin Salen 03/25/2017, 1:39 [place] Message  Patient Details  Name: Joel [name] MRN: [id number] Date of Birth: July 03, 1932   Medicare Important Message Given:  Yes    Kerin Salen 03/25/2017, 1:39 PM

## 2017-03-25 NOTE — Progress Notes (Signed)
Subjective: Pain controlled right hip.  C/o no BM and distended abdomen since Monday.  poor appetite.  Denies N/V.  Admits belching and some flatus.     Objective: Vital signs in last 24 hours: Temp:  [98.5 F (36.9 C)-98.9 F (37.2 C)] 98.7 F (37.1 C) (02/22 0535) Pulse Rate:  [65-88] 65 (02/22 0535) Resp:  [17-19] 18 (02/22 0535) BP: (103-132)/(72-89) 113/79 (02/22 0535) SpO2:  [90 %-98 %] 98 % (02/22 0535)  Intake/Output from previous day: 02/21 0701 - 02/22 0700 In: 1396.7 [P.O.:540; I.V.:856.7] Out: 250 [Urine:250] Intake/Output this shift: Total I/O In: 120 [P.O.:120] Out: -   Recent Labs    03/22/17 2243 03/24/17 0457 03/25/17 0448  HGB 9.5* 9.6* 9.5*   Recent Labs    03/24/17 0457 03/25/17 0448  WBC 7.1 5.5  RBC 3.27* 3.18*  HCT 31.7* 30.6*  PLT 149* 145*   Recent Labs    03/24/17 0457 03/25/17 0448  NA 140 136  K 4.2 4.0  CL 104 101  CO2 25 24  BUN 19 27*  CREATININE 1.41* 1.49*  GLUCOSE 117* 124*  CALCIUM 8.7* 8.9   No results for input(s): LABPT, INR in the last 72 hours.  Exam: Abdomen distended and firm.  Right hip wound looks good.  Staples intact.  No drainage or signs of infection.    Assessment/Plan: Will get stat KUB.  D/c morphine and norco.  Will use tylenol for pain.  RN reported that patient seemed somewhat confused.  He appeared appropriate to me.  Advised her to contact medicine service.    Benjiman Core 03/25/2017, 11:01 AM

## 2017-03-25 NOTE — NC FL2 (Signed)
[place] LEVEL OF CARE SCREENING TOOL     IDENTIFICATION  Patient Name: Joel [name] Birthdate: 05-19-1932 Sex: male Admission Date (Current Location): 03/22/2017  [facility] and [place] Number:  Herbalist and Address:  [facility],  [place] [address], [name]      Provider Number: [id number]  Attending Physician Name and Address:  Roxan Hockey, MD  Relative Name and Phone Number:       Current Level of Care: Hospital Recommended Level of Care: [place] Prior Approval Number:    Date Approved/Denied: 03/25/17 PASRR Number: [id number]  Discharge Plan: SNF    Current Diagnoses: Patient Active Problem List   Diagnosis Date Noted  . Closed displaced fracture of right femoral neck ([place]) 03/22/2017  . Cough 01/17/2017  . Persistent atrial fibrillation ([place]) 01/17/2017  . Leg swelling 09/26/2016  . GI bleed 05/15/2016  . UGI bleed 05/14/2016  . COPD (chronic obstructive pulmonary disease) ([place]) 05/14/2016  . Chronic GI bleeding 04/12/2016  . HLD (hyperlipidemia) 04/12/2016  . Anemia due to chronic blood loss 04/12/2016  . Diabetes mellitus with complication ([place])   . BPPV (benign paroxysmal positional vertigo) 01/27/2016  . Hypertension 01/27/2016  . Malignant neoplasm of prostate ([place]) 01/07/2016  . Paroxysmal atrial fibrillation (HCC)   . Bipolar I disorder ([place])   . Bright red rectal bleeding 12/16/2015  . Rectal bleeding 12/16/2015  . Pulmonary hypertension ([place]) 06/29/2015  . Moderate aortic stenosis 04/17/2013  . Encounter for monitoring Coumadin therapy 04/17/2013  . Anemia, iron deficiency 04/04/2013  . Dyspnea on exertion 11/18/2012  . Obesity hypoventilation syndrome ([place]) 09/19/2012  . ALLERGIC RHINITIS 06/24/2009  . DM2 (diabetes mellitus, type 2) ([place]) 03/13/2007  . OSA (obstructive sleep apnea) 03/13/2007  . COPD mixed type ([place]) 03/13/2007    Orientation RESPIRATION BLADDER Height  & Weight     Self, Time, Situation, Place  O2 Continent Weight: 234 lb 5.6 oz (106.3 kg) Height:  6' (182.9 cm)  BEHAVIORAL SYMPTOMS/MOOD NEUROLOGICAL BOWEL NUTRITION STATUS      Continent Diet(See dc summary)  AMBULATORY STATUS COMMUNICATION OF NEEDS Skin   Extensive Assist Verbally Surgical wounds(Incisions, upper right thigh )                       Personal Care Assistance Level of Assistance  Bathing, Feeding, Dressing Bathing Assistance: Limited assistance Feeding assistance: Independent Dressing Assistance: Limited assistance     Functional Limitations Info  Sight, Hearing, Speech Sight Info: Adequate Hearing Info: Adequate Speech Info: Adequate    SPECIAL CARE FACTORS FREQUENCY  PT (By licensed PT), OT (By licensed OT)     PT Frequency: 5x/week OT Frequency: 5x/week            Contractures Contractures Info: Not present    Additional Factors Info  Code Status, Allergies Code Status Info: Full Allergies Info:  Penicillins, Antihistamines, Loratadine-type, Other           Current Medications (03/25/2017):  This is the current hospital active medication list Current Facility-Administered Medications  Medication Dose Route Frequency Provider Last Rate Last Dose  . 0.9 %  sodium chloride infusion   Intravenous Continuous Emokpae, Courage, MD      . acetaminophen (TYLENOL) tablet 650 mg  650 mg Oral Q6H PRN Marybelle Killings, MD   650 mg at 03/23/17 2224   Or  . acetaminophen (TYLENOL) suppository 650 mg  650 mg Rectal Q6H PRN Rodell Perna  C, MD      . allopurinol (ZYLOPRIM) tablet 100 mg  100 mg Oral Daily Jennette Kettle M, DO   100 mg at 03/24/17 1009  . buPROPion (WELLBUTRIN XL) 24 hr tablet 150 mg  150 mg Oral Daily Jennette Kettle M, DO   150 mg at 03/24/17 1009  . cholecalciferol (VITAMIN D) tablet 1,000 Units  1,000 Units Oral Daily Etta Quill, DO   1,000 Units at 03/24/17 1008  . citalopram (CELEXA) tablet 40 mg  40 mg Oral Daily Jennette Kettle M,  DO   40 mg at 03/24/17 1008  . dextromethorphan-guaiFENesin (MUCINEX DM) 30-600 MG per 12 hr tablet 1 tablet  1 tablet Oral BID Etta Quill, DO   1 tablet at 03/24/17 2058  . diltiazem (CARDIZEM SR) 12 hr capsule 120 mg  120 mg Oral Daily Jennette Kettle M, DO   120 mg at 03/24/17 1009  . docusate sodium (COLACE) capsule 100 mg  100 mg Oral BID Marybelle Killings, MD   100 mg at 03/24/17 2059  . enoxaparin (LOVENOX) injection 30 mg  30 mg Subcutaneous Q24H Marybelle Killings, MD   30 mg at 03/25/17 0855  . feeding supplement (ENSURE ENLIVE) (ENSURE ENLIVE) liquid 237 mL  237 mL Oral BID BM Emokpae, Courage, MD   237 mL at 03/24/17 1217  . fluticasone (FLONASE) 50 MCG/ACT nasal spray 2 spray  2 spray Each Nare Daily Marybelle Killings, MD   2 spray at 03/24/17 1009  . insulin aspart (novoLOG) injection 0-9 Units  0-9 Units Subcutaneous Q4H Etta Quill, DO   1 Units at 03/25/17 0345  . iron polysaccharides (NIFEREX) capsule 150 mg  150 mg Oral BID Etta Quill, DO   150 mg at 03/24/17 2058  . losartan (COZAAR) tablet 50 mg  50 mg Oral Daily Jennette Kettle M, DO   50 mg at 03/24/17 1009  . meclizine (ANTIVERT) tablet 25 mg  25 mg Oral BID Etta Quill, DO   25 mg at 03/24/17 2058  . menthol-cetylpyridinium (CEPACOL) lozenge 3 mg  1 lozenge Oral PRN Marybelle Killings, MD       Or  . phenol (CHLORASEPTIC) mouth spray 1 spray  1 spray Mouth/Throat PRN Marybelle Killings, MD      . metoCLOPramide (REGLAN) tablet 5-10 mg  5-10 mg Oral Q8H PRN Marybelle Killings, MD       Or  . metoCLOPramide (REGLAN) injection 5-10 mg  5-10 mg Intravenous Q8H PRN Marybelle Killings, MD      . ondansetron [facility]) tablet 4 mg  4 mg Oral Q6H PRN Marybelle Killings, MD       Or  . ondansetron [facility]) injection 4 mg  4 mg Intravenous Q6H PRN Marybelle Killings, MD   4 mg at 03/23/17 2229  . pravastatin (PRAVACHOL) tablet 20 mg  20 mg Oral q1800 Etta Quill, DO   20 mg at 03/24/17 1713  . temazepam (RESTORIL) capsule 15 mg  15 mg Oral QHS  Jennette Kettle M, DO   15 mg at 03/24/17 2059     Discharge Medications: Please see discharge summary for a list of discharge medications.  Relevant Imaging Results:  Relevant Lab Results:   Additional Information ssn: [id number]  Servando Snare, LCSW

## 2017-03-25 NOTE — Clinical Social Work Note (Signed)
Clinical Social Work Assessment  Patient Details  Name: Joel [name] MRN: [id number] Date of Birth: 07/31/1932  Date of referral:  03/25/17               Reason for consult:  Facility Placement                Permission sought to share information with:  Facility Sport and exercise psychologist, Case Optician, dispensing granted to share information::  Yes, Verbal Permission Granted  Name::        Agency::  SNF  Relationship::     Contact Information:     Housing/Transportation Living arrangements for the past 2 months:  Single Family Home Source of Information:  Patient Patient Interpreter Needed:  None Criminal Activity/Legal Involvement Pertinent to Current Situation/Hospitalization:  No - Comment as needed Significant Relationships:  Neighbor, Pets Lives with:  Self Do you feel safe going back to the place where you live?  Yes Need for family participation in patient care:  No (Coment)  Care giving concerns:  Patient reports that he lives alone and has no support from friends or family.   Social Worker assessment / plan: LCSW following for SNF placement.   Patient admitted for Closed displaced fracture of right femoral neck.  LCSW met with patient at bed side. Visitors were present.   Patient reports that he lives alone with his dog and is independent in his ADLs. Patient does not drive and reports that he ambulates with a cane most of the time. Patient reports that he does a little cooking but gets most of his meals from a local restaurant that he walks to.   Patient is agreeable to SNF and prefers [facility] or [facility].   PLAN: Patient will go to SNF at DC.   Employment status:  Retired Nurse, adult PT Recommendations:  [place] / Referral to community resources:     Patient/Family's Response to care:  Patient is pleased with care and thankful for LCSW visit.   Patient/Family's Understanding of and Emotional Response  to Diagnosis, Current Treatment, and Prognosis:  Patient is understanding of current diagnosis and agreeable to treatment plan.   Emotional Assessment Appearance:  Appears stated age Attitude/Demeanor/Rapport:    Affect (typically observed):  Calm, Pleasant Orientation:  Oriented to Self, Oriented to Place, Oriented to  Time, Oriented to Situation Alcohol / Substance use:  Not Applicable Psych involvement (Current and /or in the community):  No (Comment)  Discharge Needs  Concerns to be addressed:  No discharge needs identified Readmission within the last 30 days:  No Current discharge risk:  None Barriers to Discharge:  Continued Medical Work up, No SNF bed, [place], LCSW 03/25/2017, 11:25 AM

## 2017-03-25 NOTE — Progress Notes (Signed)
Patient ID: Joel [name], male   DOB: [birth year], 84 y.o.   MRN: [id number]   I reviewed patient's abdomen x-ray and report read: EXAM: ABDOMEN - 1 VIEW  COMPARISON:  None.  FINDINGS: There is moderate stool in the colon. There is no appreciable bowel dilatation or air-fluid level to suggest bowel obstruction. No free air. Lung bases are clear. There are clips in the perirectal region. There is postoperative change in the proximal right femur.  IMPRESSION: No evident bowel obstruction or free air. No appreciable bowel dilatation. Moderate stool in colon. Lung bases clear.   Electronically Signed   By: Lowella Grip III M.D.   On: 03/25/2017 12:30   I ordered mag citrate one half p.o. x1 along with a Fleet enema.  We will see how patient does with this.

## 2017-03-25 NOTE — Discharge Instructions (Signed)
[facility] Stay Proper nutrition can help your body recover from illness and injury.   Foods and beverages high in protein, vitamins, and minerals help rebuild muscle loss, promote healing, & reduce fall risk.   In addition to eating healthy foods, a nutrition shake is an easy, delicious way to get the nutrition you need during and after your hospital stay  It is recommended that you continue to drink 2 bottles per day of:  Ensure Enlive for at least 1 month (30 days) after your hospital stay   Tips for adding a nutrition shake into your routine: As allowed, drink one with vitamins or medications instead of water or juice Enjoy one as a tasty mid-morning or afternoon snack Drink cold or make a milkshake out of it Drink one instead of milk with cereal or snacks Use as a coffee creamer Mix with ice cream or sherbet to make other flavors   Available at the following grocery stores and pharmacies:           * [facility] [phone]            For COUPONS visit: www.ensure.com/join or http://dawson-may.com/   Suggested Substitutions Ensure Plus = Boost Plus = Carnation Breakfast Essentials = Boost Compact Ensure Active Clear = Boost Breeze Glucerna Shake = Boost Glucose Control = Carnation Breakfast Essentials SUGAR FREE     ORTHOPEDIC DISCHARGE INSTRUCTIONS -ok to shower but not tub soaking -RN to do daily wound checks and dressing changes with 4X4 gauze and tape.  Do not apply any creams or ointments to incision.   -continue physical therapy.   -strict nonweightbearing right lower extremity.

## 2017-03-25 NOTE — Progress Notes (Signed)
Patient Demographics:    Joel [name], is a 84 y.o. male, DOB - 04-06-1932, TFT:[id number]  Admit date - 03/22/2017   Admitting Physician Etta Quill, DO  Outpatient Primary MD for the patient is Wenda Low, MD  LOS - 3   Chief Complaint  Patient presents with  . Fall  . Hip Pain    Right        Subjective:    Joel [name] today has no fevers, no emesis,  No chest pain, c/o constipation, he is  passing gas, no significant abdominal pain  Assessment  & Plan :    Principal Problem:   Closed displaced fracture of right femoral neck (HCC) Active Problems:   DM2 (diabetes mellitus, type 2) (HCC)   COPD mixed type (HCC)   Chronic GI bleeding   Anemia due to chronic blood loss   Persistent atrial fibrillation (HCC)   1)Rt Hip Fx- ORIF on 03/23/17 by Dr Lorin Mercy, c/n PT  2)DM- Use Novolog/Humalog Sliding scale insulin with Accu-Cheks/Fingersticks as ordered   3)HTN- stable, hold losartan due to kidney concerns until patient is euvolemic, cardizen SR 120 mg daily  4)Depression-stable, continue Wellbutrin 150 mg daily and Celexa 40 mg daily, continue temazepam 50 mg nightly  5)Dispo-PT eval appreciated , awaiting skilled nursing facility placement  6)AKi Vs CKD-creatinine about 10 months ago was 1.0, on admission creatinine this time 1.38, creatinine up to 1.49, no recent baseline creatinine available, possible acute kidney injury versus CKD in the setting of long-standing hypertension and  diabetes, continue to avoid nephrotoxic agents, hydrate gently,  7)Constipation-Dulcolax suppository and p.o. lactulose as ordered  Code Status : Full   Disposition Plan  : SNF  Consults  :  Ortho/PT   DVT Prophylaxis  :  Lovenox   Lab Results  Component Value Date   PLT 145 (L) 03/25/2017    Inpatient Medications  Scheduled Meds: . allopurinol  100 mg Oral Daily  . bisacodyl  10 mg  Rectal Once  . buPROPion  150 mg Oral Daily  . cholecalciferol  1,000 Units Oral Daily  . citalopram  40 mg Oral Daily  . dextromethorphan-guaiFENesin  1 tablet Oral BID  . diltiazem  120 mg Oral Daily  . docusate sodium  100 mg Oral BID  . enoxaparin (LOVENOX) injection  30 mg Subcutaneous Q24H  . feeding supplement (ENSURE ENLIVE)  237 mL Oral BID BM  . fluticasone  2 spray Each Nare Daily  . insulin aspart  0-9 Units Subcutaneous Q4H  . iron polysaccharides  150 mg Oral BID  . lactulose  30 g Oral Once  . losartan  50 mg Oral Daily  . meclizine  25 mg Oral BID  . pravastatin  20 mg Oral q1800  . sodium phosphate  1 enema Rectal Once  . temazepam  15 mg Oral QHS   Continuous Infusions: . sodium chloride 50 mL/hr at 03/25/17 1252   PRN Meds:.acetaminophen **OR** acetaminophen, menthol-cetylpyridinium **OR** phenol, metoCLOPramide **OR** metoCLOPramide (REGLAN) injection, ondansetron **OR** ondansetron (ZOFRAN) IV    Anti-infectives (From admission, onward)   Start     Dose/Rate Route Frequency Ordered Stop   03/23/17 1115  clindamycin (CLEOCIN) IVPB 900 mg     900 mg 100 mL/hr  over 30 Minutes Intravenous On call to O.R. 03/23/17 1110 03/23/17 1441        Objective:   Vitals:   03/25/17 0137 03/25/17 0535 03/25/17 1254 03/25/17 1436  BP: 132/74 113/79 124/80 (!) 144/87  Pulse: 72 65 79 80  Resp: 18 18  18   Temp: 98.5 F (36.9 C) 98.7 F (37.1 C)  98.5 F (36.9 C)  TempSrc: Axillary Axillary  Oral  SpO2: 96% 98%  97%  Weight:      Height:        Wt Readings from Last 3 Encounters:  03/23/17 106.3 kg (234 lb 5.6 oz)  03/17/17 107.3 kg (236 lb 9.6 oz)  01/17/17 103 kg (227 lb)     Intake/Output Summary (Last 24 hours) at 03/25/2017 1706 Last data filed at 03/25/2017 1436 Gross per 24 hour  Intake 851.67 ml  Output 475 ml  Net 376.67 ml     Physical Exam  Gen:- Awake Alert,  In no apparent distress  HEENT:- [place].AT, No sclera icterus Neck-Supple Neck,No  JVD,.  Lungs-  CTAB  CV- S1, S2 normal Abd-  +ve B.Sounds, Abd Soft, No tenderness, increased truncal adiposity Extremity/Skin:- No  edema,   right hip/thigh wound is clean dry and intact Psych-affect is appropriate Neuro-no new focal deficits   Data Review:   Micro Results Recent Results (from the past 240 hour(s))  Surgical pcr screen     Status: None   Collection Time: 03/23/17 11:44 AM  Result Value Ref Range Status   MRSA, PCR NEGATIVE NEGATIVE Final   Staphylococcus aureus NEGATIVE NEGATIVE Final    Comment: (NOTE) The Xpert SA Assay (FDA approved for NASAL specimens in patients 61 years of age and older), is one component of a comprehensive surveillance program. It is not intended to diagnose infection nor to guide or monitor treatment. Performed at [facility], [place] [address], [place], [place] [address]     Radiology Reports Dg Abd 1 View  Result Date: 03/25/2017 CLINICAL DATA:  Abdominal bloating EXAM: ABDOMEN - 1 VIEW COMPARISON:  None. FINDINGS: There is moderate stool in the colon. There is no appreciable bowel dilatation or air-fluid level to suggest bowel obstruction. No free air. Lung bases are clear. There are clips in the perirectal region. There is postoperative change in the proximal right femur. IMPRESSION: No evident bowel obstruction or free air. No appreciable bowel dilatation. Moderate stool in colon. Lung bases clear. Electronically Signed   By: Lowella Grip III M.D.   On: 03/25/2017 12:30   Ct Hip Right Wo Contrast  Result Date: 03/23/2017 CLINICAL DATA:  Right hip pain after fall yesterday. EXAM: CT OF THE RIGHT HIP WITHOUT CONTRAST TECHNIQUE: Multidetector CT imaging of the right hip was performed according to the standard protocol. Multiplanar CT image reconstructions were also generated. COMPARISON:  Right hip x-rays from yesterday. FINDINGS: Bones/Joint/Cartilage Again seen is a nondisplaced, impacted subcapital femoral neck  fracture. No additional fracture is seen. Moderate right hip joint space narrowing with subchondral sclerosis and cystic change, most prominent along the posterior aspect. Ligaments Suboptimally assessed by CT. Muscles and Tendons Unremarkable. Soft tissues Mild soft tissue swelling lateral to the greater trochanter. Vascular calcifications. The visualized intrapelvic structures are unremarkable. IMPRESSION: Acute nondisplaced, impacted subcapital femoral neck fracture. Electronically Signed   By: Titus Dubin M.D.   On: 03/23/2017 09:48   Dg Chest Portable 1 View  Result Date: 03/22/2017 CLINICAL DATA:  Fall today.  Preop, right hip fracture. EXAM:  PORTABLE CHEST 1 VIEW COMPARISON:  01/18/2017 FINDINGS: Post median sternotomy. Chronic cardiomegaly. Chronic interstitial coarsening. Vascular congestion which is new. No confluent consolidation. No pleural fluid or pneumothorax. No acute osseous abnormalities. Chronic widening of right acromioclavicular joint. IMPRESSION: 1. Chronic cardiomegaly and interstitial coarsening. 2. Vascular congestion, new. Electronically Signed   By: Jeb Levering M.D.   On: 03/22/2017 23:09   Dg C-arm 1-60 Min-no Report  Result Date: 03/23/2017 Fluoroscopy was utilized by the requesting physician.  No radiographic interpretation.   Dg Hip Unilat  With Pelvis 2-3 Views Right  Result Date: 03/22/2017 CLINICAL DATA:  Trip and fall at home this evening, right hip pain. EXAM: DG HIP (WITH OR WITHOUT PELVIS) 2-3V RIGHT COMPARISON:  None. FINDINGS: Impacted subcapital right femoral neck fracture. Femoral head remains seated. No additional acute fracture of the pelvis. Pubic symphysis and sacroiliac joints are congruent. IMPRESSION: Impacted subcapital right femoral neck fracture. Electronically Signed   By: Jeb Levering M.D.   On: 03/22/2017 22:02     CBC Recent Labs  Lab 03/22/17 2243 03/24/17 0457 03/25/17 0448  WBC 9.1 7.1 5.5  HGB 9.5* 9.6* 9.5*  HCT 30.9*  31.7* 30.6*  PLT 165 149* 145*  MCV 93.9 96.9 96.2  MCH 28.9 29.4 29.9  MCHC 30.7 30.3 31.0  RDW 15.5 16.0* 15.8*  LYMPHSABS 0.4*  --   --   MONOABS 0.6  --   --   EOSABS 0.0  --   --   BASOSABS 0.0  --   --     Chemistries  Recent Labs  Lab 03/22/17 2243 03/24/17 0457 03/25/17 0448  NA 140 140 136  K 4.1 4.2 4.0  CL 106 104 101  CO2 23 25 24   GLUCOSE 184* 117* 124*  BUN 18 19 27*  CREATININE 1.39* 1.41* 1.49*  CALCIUM 8.8* 8.7* 8.9   ------------------------------------------------------------------------------------------------------------------ No results for input(s): CHOL, HDL, LDLCALC, TRIG, CHOLHDL, LDLDIRECT in the last 72 hours.  Lab Results  Component Value Date   HGBA1C 5.6 04/12/2016   ------------------------------------------------------------------------------------------------------------------ No results for input(s): TSH, T4TOTAL, T3FREE, THYROIDAB in the last 72 hours.  Invalid input(s): FREET3 ------------------------------------------------------------------------------------------------------------------ No results for input(s): VITAMINB12, FOLATE, FERRITIN, TIBC, IRON, RETICCTPCT in the last 72 hours.  Coagulation profile No results for input(s): INR, PROTIME in the last 168 hours.  No results for input(s): DDIMER in the last 72 hours.  Cardiac Enzymes No results for input(s): CKMB, TROPONINI, MYOGLOBIN in the last 168 hours.  Invalid input(s): CK ------------------------------------------------------------------------------------------------------------------    Component Value Date/Time   BNP 57.0 06/28/2015 2227     Roxan Hockey M.D on 03/25/2017 at 5:06 PM  Between 7am to 7pm - Pager - [phone]  After 7pm go to [email or web address] - password [email or web address]  [facility] -  Office  [phone]   Voice Recognition Viviann Spare dictation system was used to create this note, attempts have been made to correct errors. Please contact the  author with questions and/or clarifications.

## 2017-03-26 LAB — BASIC METABOLIC PANEL
ANION GAP: 12 (ref 5–15)
BUN: 28 mg/dL — AB (ref 6–20)
CALCIUM: 8.7 mg/dL — AB (ref 8.9–10.3)
CO2: 24 mmol/L (ref 22–32)
Chloride: 103 mmol/L (ref 101–111)
Creatinine, Ser: 1.28 mg/dL — ABNORMAL HIGH (ref 0.61–1.24)
GFR calc Af Amer: 58 mL/min — ABNORMAL LOW (ref >60)
GFR calc non Af Amer: 50 mL/min — ABNORMAL LOW (ref >60)
GLUCOSE: 128 mg/dL — AB (ref 65–99)
Potassium: 3.9 mmol/L (ref 3.5–5.1)
SODIUM: 139 mmol/L (ref 135–145)

## 2017-03-26 LAB — CBC
HCT: 28.1 % — ABNORMAL LOW (ref 39.0–52.0)
Hemoglobin: 8.8 g/dL — ABNORMAL LOW (ref 13.0–17.0)
MCH: 29.7 pg (ref 26.0–34.0)
MCHC: 31.3 g/dL (ref 30.0–36.0)
MCV: 94.9 fL (ref 78.0–100.0)
PLATELETS: 153 10*3/uL (ref 150–400)
RBC: 2.96 MIL/uL — ABNORMAL LOW (ref 4.22–5.81)
RDW: 15.8 % — AB (ref 11.5–15.5)
WBC: 4.9 10*3/uL (ref 4.0–10.5)

## 2017-03-26 LAB — GLUCOSE, CAPILLARY
GLUCOSE-CAPILLARY: 139 mg/dL — AB (ref 65–99)
GLUCOSE-CAPILLARY: 107 mg/dL — AB (ref 65–99)
GLUCOSE-CAPILLARY: 154 mg/dL — AB (ref 65–99)
GLUCOSE-CAPILLARY: 157 mg/dL — AB (ref 65–99)
Glucose-Capillary: 137 mg/dL — ABNORMAL HIGH (ref 65–99)
Glucose-Capillary: 141 mg/dL — ABNORMAL HIGH (ref 65–99)

## 2017-03-26 MED ORDER — INSULIN ASPART 100 UNIT/ML ~~LOC~~ SOLN
0.0000 [IU] | Freq: Three times a day (TID) | SUBCUTANEOUS | Status: DC
  Administered 2017-03-26 – 2017-03-27 (×2): 2 [IU] via SUBCUTANEOUS

## 2017-03-26 NOTE — Progress Notes (Signed)
Patient Demographics:    Joel [name], is a 84 y.o. male, DOB - 31-Jul-1932, UXN:[id number]  Admit date - 03/22/2017   Admitting Physician Etta Quill, DO  Outpatient Primary MD for the patient is Wenda Low, MD  LOS - 4   Chief Complaint  Patient presents with  . Fall  . Hip Pain    Right        Subjective:    Joel [name] today has no fevers, no emesis,  No chest pain, had BM,   Assessment  & Plan :    Principal Problem:   Closed displaced fracture of right femoral neck (HCC) Active Problems:   DM2 (diabetes mellitus, type 2) (HCC)   COPD mixed type (HCC)   Chronic GI bleeding   Anemia due to chronic blood loss   Persistent atrial fibrillation (HCC)   1)Rt Hip Fx- ORIF on 03/23/17 by Dr Lorin Mercy, c/n PT , awaiting SNF   2)DM- stable, Use Novolog/Humalog Sliding scale insulin with Accu-Cheks/Fingersticks as ordered   3)HTN- stable, hold losartan due to kidney concerns until patient is euvolemic, cardizen SR 120 mg daily  4)Depression-stable, continue Wellbutrin 150 mg daily and Celexa 40 mg daily, continue temazepam 50 mg nightly  5)Dispo-PT eval appreciated , awaiting skilled nursing facility placement  6)AKi Vs CKD-creatinine about 10 months ago was 1.0, on admission creatinine this time 1.38, creatinine now 1.28, no recent baseline creatinine available, possible acute kidney injury versus CKD in the setting of long-standing hypertension and  diabetes, continue to avoid nephrotoxic agents, hydrate gently,  7)Constipation-Dulcolax suppository and p.o. lactulose as ordered  Code Status : Full   Disposition Plan  : SNF  Consults  :  Ortho/PT   DVT Prophylaxis  :  Lovenox   Lab Results  Component Value Date   PLT 153 03/26/2017    Inpatient Medications  Scheduled Meds: . allopurinol  100 mg Oral Daily  . bisacodyl  10 mg Rectal Once  . buPROPion  150 mg Oral Daily    . cholecalciferol  1,000 Units Oral Daily  . citalopram  40 mg Oral Daily  . dextromethorphan-guaiFENesin  1 tablet Oral BID  . diltiazem  120 mg Oral Daily  . enoxaparin (LOVENOX) injection  30 mg Subcutaneous Q24H  . feeding supplement (ENSURE ENLIVE)  237 mL Oral BID BM  . fluticasone  2 spray Each Nare Daily  . insulin aspart  0-9 Units Subcutaneous Q4H  . iron polysaccharides  150 mg Oral BID  . meclizine  25 mg Oral BID  . pravastatin  20 mg Oral q1800  . senna-docusate  2 tablet Oral BID  . sodium phosphate  1 enema Rectal Once  . temazepam  15 mg Oral QHS   Continuous Infusions:  PRN Meds:.acetaminophen **OR** acetaminophen, hydrALAZINE, menthol-cetylpyridinium **OR** phenol, metoCLOPramide **OR** metoCLOPramide (REGLAN) injection, ondansetron **OR** ondansetron (ZOFRAN) IV    Anti-infectives (From admission, onward)   Start     Dose/Rate Route Frequency Ordered Stop   03/23/17 1115  clindamycin (CLEOCIN) IVPB 900 mg     900 mg 100 mL/hr over 30 Minutes Intravenous On call to O.R. 03/23/17 1110 03/23/17 1441        Objective:   Vitals:   03/25/17 2200 03/26/17 0600  03/26/17 1037 03/26/17 1415  BP: (!) 119/53 124/72 (!) 145/89 (!) 145/65  Pulse: 67 72 83 79  Resp: 18 16 20 18   Temp: 98 F (36.7 C) 98.4 F (36.9 C) 98.1 F (36.7 C) 98.5 F (36.9 C)  TempSrc: Oral Oral Oral Oral  SpO2: 99% 99% 94% 94%  Weight:      Height:        Wt Readings from Last 3 Encounters:  03/23/17 106.3 kg (234 lb 5.6 oz)  03/17/17 107.3 kg (236 lb 9.6 oz)  01/17/17 103 kg (227 lb)     Intake/Output Summary (Last 24 hours) at 03/26/2017 1805 Last data filed at 03/26/2017 1600 Gross per 24 hour  Intake 971.67 ml  Output 425 ml  Net 546.67 ml     Physical Exam  Gen:- Awake Alert,  In no apparent distress  HEENT:- [place].AT, No sclera icterus Neck-Supple Neck,No JVD,.  Lungs-  CTAB  CV- S1, S2 normal Abd-  +ve B.Sounds, Abd Soft, No tenderness, increased truncal  adiposity Extremity/Skin:- No  edema,   right hip/thigh wound is clean dry and intact Psych-affect is appropriate Neuro-no new focal deficits   Data Review:   Micro Results Recent Results (from the past 240 hour(s))  Surgical pcr screen     Status: None   Collection Time: 03/23/17 11:44 AM  Result Value Ref Range Status   MRSA, PCR NEGATIVE NEGATIVE Final   Staphylococcus aureus NEGATIVE NEGATIVE Final    Comment: (NOTE) The Xpert SA Assay (FDA approved for NASAL specimens in patients 2 years of age and older), is one component of a comprehensive surveillance program. It is not intended to diagnose infection nor to guide or monitor treatment. Performed at [facility] [place] [address], [place], [place] [address]     Radiology Reports Dg Abd 1 View  Result Date: 03/25/2017 CLINICAL DATA:  Abdominal bloating EXAM: ABDOMEN - 1 VIEW COMPARISON:  None. FINDINGS: There is moderate stool in the colon. There is no appreciable bowel dilatation or air-fluid level to suggest bowel obstruction. No free air. Lung bases are clear. There are clips in the perirectal region. There is postoperative change in the proximal right femur. IMPRESSION: No evident bowel obstruction or free air. No appreciable bowel dilatation. Moderate stool in colon. Lung bases clear. Electronically Signed   By: Lowella Grip III M.D.   On: 03/25/2017 12:30   Ct Hip Right Wo Contrast  Result Date: 03/23/2017 CLINICAL DATA:  Right hip pain after fall yesterday. EXAM: CT OF THE RIGHT HIP WITHOUT CONTRAST TECHNIQUE: Multidetector CT imaging of the right hip was performed according to the standard protocol. Multiplanar CT image reconstructions were also generated. COMPARISON:  Right hip x-rays from yesterday. FINDINGS: Bones/Joint/Cartilage Again seen is a nondisplaced, impacted subcapital femoral neck fracture. No additional fracture is seen. Moderate right hip joint space narrowing with subchondral  sclerosis and cystic change, most prominent along the posterior aspect. Ligaments Suboptimally assessed by CT. Muscles and Tendons Unremarkable. Soft tissues Mild soft tissue swelling lateral to the greater trochanter. Vascular calcifications. The visualized intrapelvic structures are unremarkable. IMPRESSION: Acute nondisplaced, impacted subcapital femoral neck fracture. Electronically Signed   By: Titus Dubin M.D.   On: 03/23/2017 09:48   Dg Chest Portable 1 View  Result Date: 03/22/2017 CLINICAL DATA:  Fall today.  Preop, right hip fracture. EXAM: PORTABLE CHEST 1 VIEW COMPARISON:  01/18/2017 FINDINGS: Post median sternotomy. Chronic cardiomegaly. Chronic interstitial coarsening. Vascular congestion which is new. No confluent consolidation.  No pleural fluid or pneumothorax. No acute osseous abnormalities. Chronic widening of right acromioclavicular joint. IMPRESSION: 1. Chronic cardiomegaly and interstitial coarsening. 2. Vascular congestion, new. Electronically Signed   By: Jeb Levering M.D.   On: 03/22/2017 23:09   Dg C-arm 1-60 Min-no Report  Result Date: 03/23/2017 Fluoroscopy was utilized by the requesting physician.  No radiographic interpretation.   Dg Hip Unilat  With Pelvis 2-3 Views Right  Result Date: 03/22/2017 CLINICAL DATA:  Trip and fall at home this evening, right hip pain. EXAM: DG HIP (WITH OR WITHOUT PELVIS) 2-3V RIGHT COMPARISON:  None. FINDINGS: Impacted subcapital right femoral neck fracture. Femoral head remains seated. No additional acute fracture of the pelvis. Pubic symphysis and sacroiliac joints are congruent. IMPRESSION: Impacted subcapital right femoral neck fracture. Electronically Signed   By: Jeb Levering M.D.   On: 03/22/2017 22:02     CBC Recent Labs  Lab 03/22/17 2243 03/24/17 0457 03/25/17 0448 03/26/17 0516  WBC 9.1 7.1 5.5 4.9  HGB 9.5* 9.6* 9.5* 8.8*  HCT 30.9* 31.7* 30.6* 28.1*  PLT 165 149* 145* 153  MCV 93.9 96.9 96.2 94.9  MCH 28.9  29.4 29.9 29.7  MCHC 30.7 30.3 31.0 31.3  RDW 15.5 16.0* 15.8* 15.8*  LYMPHSABS 0.4*  --   --   --   MONOABS 0.6  --   --   --   EOSABS 0.0  --   --   --   BASOSABS 0.0  --   --   --     Chemistries  Recent Labs  Lab 03/22/17 2243 03/24/17 0457 03/25/17 0448 03/26/17 0516  NA 140 140 136 139  K 4.1 4.2 4.0 3.9  CL 106 104 101 103  CO2 23 25 24 24   GLUCOSE 184* 117* 124* 128*  BUN 18 19 27* 28*  CREATININE 1.39* 1.41* 1.49* 1.28*  CALCIUM 8.8* 8.7* 8.9 8.7*   ------------------------------------------------------------------------------------------------------------------ No results for input(s): CHOL, HDL, LDLCALC, TRIG, CHOLHDL, LDLDIRECT in the last 72 hours.  Lab Results  Component Value Date   HGBA1C 5.6 04/12/2016   ------------------------------------------------------------------------------------------------------------------ No results for input(s): TSH, T4TOTAL, T3FREE, THYROIDAB in the last 72 hours.  Invalid input(s): FREET3 ------------------------------------------------------------------------------------------------------------------ No results for input(s): VITAMINB12, FOLATE, FERRITIN, TIBC, IRON, RETICCTPCT in the last 72 hours.  Coagulation profile No results for input(s): INR, PROTIME in the last 168 hours.  No results for input(s): DDIMER in the last 72 hours.  Cardiac Enzymes No results for input(s): CKMB, TROPONINI, MYOGLOBIN in the last 168 hours.  Invalid input(s): CK ------------------------------------------------------------------------------------------------------------------    Component Value Date/Time   BNP 57.0 06/28/2015 2227     Roxan Hockey M.D on 03/26/2017 at 6:05 PM  Between 7am to 7pm - Pager - [phone]  After 7pm go to [email or web address] - password [email or web address]  [facility] -  Office  [phone]   Voice Recognition Viviann Spare dictation system was used to create this note, attempts have been made to correct  errors. Please contact the author with questions and/or clarifications.

## 2017-03-26 NOTE — Clinical Social Work Note (Signed)
CSW visited room and talked with patient and family regarding discharge plan. [facility] is requested facility. CSW contacted Claiborne Billings, admissions director while in room and she is aware of patient and can take Mr. Schwertner when medically stable.   Makaiah Terwilliger Givens, MSW, LCSW Licensed Clinical Social Worker [place] [phone]

## 2017-03-26 NOTE — Progress Notes (Signed)
Physical Therapy Treatment Patient Details Name: Joel [name] MRN: [id number] DOB: 09/05/1932 Today's Date: 03/26/2017    History of Present Illness Pt was admitted after a fall and is s/p R cannulated hip pinning.  PMH:  COPD, DM, AFIB,     PT Comments    Pt able to get up and get to chair with +2 assist and good NWB technique.  Pt took one step but then had to have chair pulled up behind him due to SOB and pt getting hot and dizzy.  Follow Up Recommendations  SNF     Equipment Recommendations  None recommended by PT    Recommendations for Other Services       Precautions / Restrictions Precautions Precautions: Fall Restrictions Weight Bearing Restrictions: Yes RLE Weight Bearing: Non weight bearing(right leg)    Mobility  Bed Mobility Overal bed mobility: Needs Assistance Bed Mobility: Supine to Sit     Supine to sit: Mod assist;HOB elevated;+2 for physical assistance     General bed mobility comments: assist for legs and trunk.  Pt used rail to assist and initiated moving legs off bed  Transfers Overall transfer level: Needs assistance Equipment used: Rolling walker (2 wheeled) Transfers: Sit to/from [facility] Sit to Stand: Mod assist;+2 physical assistance;From elevated surface Stand pivot transfers: Mod assist;+2 physical assistance       General transfer comment: assist to power up and stabilize. Cues for NWB and UE placement.  pt impulsive - needed direct max cues to follow my directions.  pt took one step - good NWB technque but then started getting SOB and chair pulled up and pt sat and rested.  pt intiially dizzy and hot but this passed quickly.  Ambulation/Gait             General Gait Details: pt took one step but unabel to go any further due to SOB.  pt did well maintaining NWB status   Stairs            Wheelchair Mobility    Modified Rankin (Stroke Patients Only)       Balance                                            Cognition Arousal/Alertness: Awake/alert Behavior During Therapy: WFL for tasks assessed/performed Overall Cognitive Status: Within Functional Limits for tasks assessed                                        Exercises Total Joint Exercises Ankle Circles/Pumps: AROM;Both;Seated;10 reps Quad Sets: AROM;Both;10 reps;Seated    General Comments        Pertinent Vitals/Pain Pain Assessment: 0-10 Faces Pain Scale: Hurts little more Pain Location: R hip Pain Descriptors / Indicators: Aching;Sore Pain Intervention(s): Repositioned;Monitored during session(no increased pain after therapy)    Home Living                      Prior Function            PT Goals (current goals can now be found in the care plan section) Progress towards PT goals: Progressing toward goals    Frequency    Min 3X/week      PT Plan Current plan remains appropriate    Co-evaluation  AM-PAC PT "6 Clicks" Daily Activity  Outcome Measure  Difficulty turning over in bed (including adjusting bedclothes, sheets and blankets)?: Unable Difficulty moving from lying on back to sitting on the side of the bed? : Unable Difficulty sitting down on and standing up from a chair with arms (e.g., wheelchair, bedside commode, etc,.)?: Unable Help needed moving to and from a bed to chair (including a wheelchair)?: Total Help needed walking in hospital room?: Total Help needed climbing 3-5 steps with a railing? : Total 6 Click Score: 6    End of Session Equipment Utilized During Treatment: Gait belt Activity Tolerance: Patient limited by fatigue Patient left: in chair;with chair alarm set;with call bell/phone within reach Nurse Communication: Mobility status PT Visit Diagnosis: Muscle weakness (generalized) (M62.81);Unsteadiness on feet (R26.81);Pain Pain - part of body: Hip     Time: 1350-1415 PT Time Calculation (min) (ACUTE ONLY):  25 min  Charges:  $Therapeutic Activity: 23-37 mins                    G Codes:       2017/04/21   Rande Lawman, PT    Loyal Buba 04/21/17, 3:24 PM

## 2017-03-27 DIAGNOSIS — J3089 Other allergic rhinitis: Secondary | ICD-10-CM | POA: Diagnosis not present

## 2017-03-27 DIAGNOSIS — F319 Bipolar disorder, unspecified: Secondary | ICD-10-CM | POA: Diagnosis not present

## 2017-03-27 DIAGNOSIS — R52 Pain, unspecified: Secondary | ICD-10-CM | POA: Diagnosis not present

## 2017-03-27 DIAGNOSIS — K219 Gastro-esophageal reflux disease without esophagitis: Secondary | ICD-10-CM | POA: Diagnosis not present

## 2017-03-27 DIAGNOSIS — S72011D Unspecified intracapsular fracture of right femur, subsequent encounter for closed fracture with routine healing: Secondary | ICD-10-CM | POA: Diagnosis not present

## 2017-03-27 DIAGNOSIS — E569 Vitamin deficiency, unspecified: Secondary | ICD-10-CM | POA: Diagnosis not present

## 2017-03-27 DIAGNOSIS — G4733 Obstructive sleep apnea (adult) (pediatric): Secondary | ICD-10-CM | POA: Diagnosis not present

## 2017-03-27 DIAGNOSIS — R42 Dizziness and giddiness: Secondary | ICD-10-CM | POA: Diagnosis not present

## 2017-03-27 DIAGNOSIS — R040 Epistaxis: Secondary | ICD-10-CM | POA: Diagnosis not present

## 2017-03-27 DIAGNOSIS — D500 Iron deficiency anemia secondary to blood loss (chronic): Secondary | ICD-10-CM | POA: Diagnosis not present

## 2017-03-27 DIAGNOSIS — S92153A Displaced avulsion fracture (chip fracture) of unspecified talus, initial encounter for closed fracture: Secondary | ICD-10-CM | POA: Diagnosis not present

## 2017-03-27 DIAGNOSIS — I10 Essential (primary) hypertension: Secondary | ICD-10-CM | POA: Diagnosis not present

## 2017-03-27 DIAGNOSIS — Z4789 Encounter for other orthopedic aftercare: Secondary | ICD-10-CM | POA: Diagnosis not present

## 2017-03-27 DIAGNOSIS — N189 Chronic kidney disease, unspecified: Secondary | ICD-10-CM | POA: Diagnosis not present

## 2017-03-27 DIAGNOSIS — J449 Chronic obstructive pulmonary disease, unspecified: Secondary | ICD-10-CM | POA: Diagnosis not present

## 2017-03-27 DIAGNOSIS — Z9181 History of falling: Secondary | ICD-10-CM | POA: Diagnosis not present

## 2017-03-27 DIAGNOSIS — I4891 Unspecified atrial fibrillation: Secondary | ICD-10-CM | POA: Diagnosis not present

## 2017-03-27 DIAGNOSIS — M6281 Muscle weakness (generalized): Secondary | ICD-10-CM | POA: Diagnosis not present

## 2017-03-27 DIAGNOSIS — E119 Type 2 diabetes mellitus without complications: Secondary | ICD-10-CM | POA: Diagnosis not present

## 2017-03-27 DIAGNOSIS — K5900 Constipation, unspecified: Secondary | ICD-10-CM | POA: Diagnosis not present

## 2017-03-27 DIAGNOSIS — M1000 Idiopathic gout, unspecified site: Secondary | ICD-10-CM | POA: Diagnosis not present

## 2017-03-27 DIAGNOSIS — E785 Hyperlipidemia, unspecified: Secondary | ICD-10-CM | POA: Diagnosis not present

## 2017-03-27 DIAGNOSIS — S72001D Fracture of unspecified part of neck of right femur, subsequent encounter for closed fracture with routine healing: Secondary | ICD-10-CM | POA: Diagnosis not present

## 2017-03-27 DIAGNOSIS — I480 Paroxysmal atrial fibrillation: Secondary | ICD-10-CM | POA: Diagnosis not present

## 2017-03-27 DIAGNOSIS — M25551 Pain in right hip: Secondary | ICD-10-CM | POA: Diagnosis not present

## 2017-03-27 DIAGNOSIS — R05 Cough: Secondary | ICD-10-CM | POA: Diagnosis not present

## 2017-03-27 DIAGNOSIS — S72001A Fracture of unspecified part of neck of right femur, initial encounter for closed fracture: Secondary | ICD-10-CM | POA: Diagnosis not present

## 2017-03-27 DIAGNOSIS — G8911 Acute pain due to trauma: Secondary | ICD-10-CM | POA: Diagnosis not present

## 2017-03-27 DIAGNOSIS — R262 Difficulty in walking, not elsewhere classified: Secondary | ICD-10-CM | POA: Diagnosis not present

## 2017-03-27 DIAGNOSIS — R278 Other lack of coordination: Secondary | ICD-10-CM | POA: Diagnosis not present

## 2017-03-27 DIAGNOSIS — F3289 Other specified depressive episodes: Secondary | ICD-10-CM | POA: Diagnosis not present

## 2017-03-27 LAB — GLUCOSE, CAPILLARY
GLUCOSE-CAPILLARY: 113 mg/dL — AB (ref 65–99)
GLUCOSE-CAPILLARY: 189 mg/dL — AB (ref 65–99)

## 2017-03-27 MED ORDER — LOSARTAN POTASSIUM 25 MG PO TABS: 25.0000 mg | ORAL_TABLET | Freq: Every day | ORAL | 1 refills | Status: DC

## 2017-03-27 MED ORDER — SENNOSIDES-DOCUSATE SODIUM 8.6-50 MG PO TABS: 2.0000 | ORAL_TABLET | Freq: Two times a day (BID) | ORAL | 1 refills | Status: DC

## 2017-03-27 MED ORDER — FUROSEMIDE 40 MG PO TABS: 40.0000 mg | ORAL_TABLET | Freq: Every day | ORAL | 1 refills | Status: DC

## 2017-03-27 MED ORDER — TEMAZEPAM 15 MG PO CAPS: 15.0000 mg | ORAL_CAPSULE | Freq: Every day | ORAL | 0 refills | Status: DC

## 2017-03-27 MED ORDER — OMEPRAZOLE 20 MG PO CPDR: 20.0000 mg | DELAYED_RELEASE_CAPSULE | Freq: Two times a day (BID) | ORAL | 1 refills | Status: DC

## 2017-03-27 NOTE — Progress Notes (Addendum)
Patient will discharge to [facility] Anticipated discharge date: 03/27/17 Family notified: Avon Mergenthaler, son Transportation by: Corey Harold - CSW called at approx 2 pm for pickup at 3pm.  Nurse to call report to [phone].   CSW signing off.  Estanislado Emms, [place]  Clinical Social Worker

## 2017-03-27 NOTE — Discharge Summary (Addendum)
Joel [name], is a 84 y.o. male  DOB [birth year]  MRN [id number].  Admission date:  03/22/2017  Admitting Physician  Etta Quill, DO  Discharge Date:  03/27/2017   Primary MD  Wenda Low, MD  Recommendations for primary care physician for things to follow:   1)Repeat CBC and BMP on Wednesday 03/30/2017 2)Avoid constipation 3)Hold Losartan until after BMP result is available on 03/30/2017  Admission Diagnosis  Closed subcapital fracture of femur, right, initial encounter [facility]) [S72.011A]   Discharge Diagnosis  Closed subcapital fracture of femur, right, initial encounter ([place]) [S72.011A]   Principal Problem:   Closed displaced fracture of right femoral neck ([place]) Active Problems:   DM2 (diabetes mellitus, type 2) ([place])   COPD mixed type ([place])   Chronic GI bleeding   Anemia due to chronic blood loss   Persistent atrial fibrillation ([place])      Past Medical History:  Diagnosis Date  . Allergic rhinitis   . Anemia   . Anxiety   . Aortic stenosis 2012   mild to mod   . Atrial fibrillation ([place]) 1991   with multiple DCCV  . Bipolar affective disorder ([place])   . COPD (chronic obstructive pulmonary disease) ([place])   . Diabetic neuropathy ([place])    in feet  . Diabetic neuropathy ([place])   . Dyslipidemia   . Fatigue    last year or so  . Gout   . Hypertension   . Insomnia   . Obesity   . On home oxygen therapy 2 1/2 liters with bipap at night  . OSA (obstructive sleep apnea)    severe, uses bipap 16 1/2 by 12 or 13 setting  . Prostate cancer ([place])   . Rectal bleeding 12/16/2015  . Type 2 diabetes mellitus ([place])   . Vertigo     Past Surgical History:  Procedure Laterality Date  . CARDIOVERSION  yrs ago   prior to 1998  . COLONOSCOPY WITH PROPOFOL N/A 01/23/2013   Procedure: COLONOSCOPY WITH PROPOFOL;  Surgeon: Garlan Fair, MD;  Location: [facility]COPY;  Service: Endoscopy;   Laterality: N/A;  . electro shock  1969   for depression  . ENTEROSCOPY Left 05/17/2016   Procedure: ENTEROSCOPY;  Surgeon: Wilford Corner, MD;  Location: [facility];  Service: Endoscopy;  Laterality: Left;  . ESOPHAGOGASTRODUODENOSCOPY N/A 03/03/2016   Procedure: ESOPHAGOGASTRODUODENOSCOPY (EGD);  Surgeon: Teena Irani, MD;  Location: [facility];  Service: Endoscopy;  Laterality: N/A;  . ESOPHAGOGASTRODUODENOSCOPY N/A 03/26/2016   Procedure: ESOPHAGOGASTRODUODENOSCOPY (EGD);  Surgeon: Teena Irani, MD;  Location: [facility] ENDOSCOPY;  Service: Endoscopy;  Laterality: N/A;  would like to use ultraslim scope  . ESOPHAGOGASTRODUODENOSCOPY (EGD) WITH PROPOFOL N/A 01/23/2013   Procedure: ESOPHAGOGASTRODUODENOSCOPY (EGD) WITH PROPOFOL;  Surgeon: Garlan Fair, MD;  [facility];  Service: Endoscopy;  Laterality: N/A;  . ESOPHAGOGASTRODUODENOSCOPY (EGD) WITH PROPOFOL N/A 04/13/2016   Procedure: ESOPHAGOGASTRODUODENOSCOPY (EGD) WITH PROPOFOL;  Surgeon: Otis Brace, MD;  Location: [place];  Service: Gastroenterology;  Laterality: N/A;  . GIVENS  CAPSULE STUDY N/A 03/03/2016   Procedure: GIVENS CAPSULE STUDY;  Surgeon: Teena Irani, MD;  Location: [facility];  Service: Endoscopy;  Laterality: N/A;  . HEMORRHOID SURGERY N/A 12/16/2015   Procedure: PROCTOSCOPY WITH CONTROL OF BLEEDING;  Surgeon: Fanny Skates, MD;  Location: [place];  Service: General;  Laterality: N/A;  . HIP PINNING,CANNULATED Right 03/23/2017   Procedure: CANNULATED HIP PINNING;  Surgeon: Marybelle Killings, MD;  Location: [facility];  Service: Orthopedics;  Laterality: Right;  . KNEE SURGERY  1970's   rt  . MAZE  1998  . PROSTATE BIOPSY    . SHOULDER SURGERY  7-8 yrs ago   rt       HPI  from the history and physical done on the day of admission:    Chief Complaint: Fall, R hip pain  HPI: Joel [name] is a 84 y.o. 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  Patient presents to the ED with c/o R hip pain after a mechanical fall at home.  Stood up to let dog out, lost balance, fell, landed on R hip.  Severe R hip pain, worse with movement, inability to bear weight.  ED Course: R impacted femoral neck fx.    Hospital Course:    1)Rt Hip Fx- ORIF on 03/23/17 by Dr Lorin Mercy, c/n PT , transfer to  SNF  for Rehab, orthopedic surgeon recommends Lovenox 30 mg subcu daily for 1 month for DVT prophylaxis, consider stopping Lovenox if bleeding concerns  2)DM-stable,  continue metformin as ordered , give Amaryl 1 mg daily with breakfast, may use sliding scale insulin  3)HTN- stable, hold losartan due to kidney concerns until 03/30/2017 when BMP is rechecked , tc/n cardizen SR 120 mg daily  4)Depression-stable, continue Wellbutrin 150 mg daily and Celexa 40 mg daily, continue temazepam 15 mg nightly  5)Anemia-acute on chronic anemia, patient has chronic anemia in the setting of history of AVMs with prior GI bleed requiring transfusion, suspect H&H is drifting down again at this time due to acute blood loss related to his right hip surgery.  Continue iron replacement, monitor H&H and transfuse as needed, consider stopping Lovenox if bleeding concerns  6)AKi Vs CKD-creatinine about 10 months ago was 1.0, on admission creatinine this time 1.38, creatinine now 1.28, no recent baseline creatinine available, possible acute kidney injury versus CKD in the setting of long-standing hypertension and  diabetes, continue to avoid nephrotoxic agents, low-dose Lasix  7)Constipation- improved with laxatives, give Senokot 2 tablets twice daily  8)H/o PAfib and Moderate AS-last known EF 55-60%, patient previously had MAZE procedure , continue Cardizem for rate control, no full anticoagulation for A. fib due to prior GI bleed in the setting of AVMs, patient currently on subcu Lovenox for DVT prophylaxis as noted above #1.    8)OSA- use Cpap qhs  Discharge Condition: stable  Follow UP   Contact information for follow-up providers    Marybelle Killings, MD. Schedule an appointment as soon as possible for a visit today.   Specialty:  Orthopedic Surgery Why:  need return office visit 2 weeks postop Contact information: [place] [address] [phone]            Contact information for after-discharge care    Destination    [facility] .   Service:  Skilled Nursing Contact information: [address]  [place] [phone]                   Consults obtained - Ortho  Diet and Activity recommendation:  As advised  Discharge Instructions    Discharge Instructions    Call MD for:  difficulty breathing, headache or visual disturbances   Complete by:  As directed    Call MD for:  persistant dizziness or light-headedness   Complete by:  As directed    Call MD for:  persistant nausea and vomiting   Complete by:  As directed    Call MD for:  severe uncontrolled pain   Complete by:  As directed    Call MD for:  temperature >100.4   Complete by:  As directed    Diet - low sodium heart healthy   Complete by:  As directed    Diet Carb Modified   Complete by:  As directed    Discharge instructions   Complete by:  As directed    1)Repeat CBC and BMP on Wednesday 03/30/2017 2)Avoid constipation 3)Hold Losartan until after BMP result is available on 03/30/2017 4)Use Cpap Machine qhs   Increase activity slowly   Complete by:  As directed         Discharge Medications     Allergies as of 03/27/2017      Reactions   Penicillins Anaphylaxis   Has patient had a PCN reaction causing immediate rash, facial/tongue/throat swelling, SOB or lightheadedness with hypotension: unknown Has patient had a PCN reaction causing severe rash involving mucus membranes or skin necrosis: unknown Has patient had a PCN reaction that required  hospitalization: unknown Has patient had a PCN reaction occurring within the last 10 years: no If all of the above answers are "NO", then may proceed with Cephalosporin use.   Antihistamines, Loratadine-type Other (See Comments)   depression   Other    Beta blockers-Novacaine  Caused depression      Medication List    TAKE these medications   allopurinol 100 MG tablet Commonly known as:  ZYLOPRIM Take 100 mg by mouth daily.   buPROPion 150 MG 24 hr tablet Commonly known as:  WELLBUTRIN XL Take 1 tablet (150 mg total) by mouth daily.   cholecalciferol 1000 units tablet Commonly known as:  VITAMIN D Take 1,000 Units by mouth daily.   citalopram 40 MG tablet Commonly known as:  CELEXA Take 40 mg by mouth daily.   dextromethorphan-guaiFENesin 30-600 MG 12hr tablet Commonly known as:  MUCINEX DM Take 1 tablet by mouth 2 (two) times daily.   diltiazem 120 MG 12 hr capsule Commonly known as:  CARDIZEM SR Take 120 mg by mouth daily.   enoxaparin 30 MG/0.3ML injection Commonly known as:  LOVENOX Inject 0.3 mLs (30 mg total) into the skin daily.   fluticasone 50 MCG/ACT nasal spray Commonly known as:  FLONASE Place 2 sprays into both nostrils daily. What changed:    when to take this  reasons to take this   furosemide 40 MG tablet Commonly known as:  LASIX Take 1 tablet (40 mg total) by mouth daily. What changed:    medication strength  how much to take   glimepiride 1 MG tablet Commonly known as:  AMARYL Take 1 mg by mouth daily with breakfast.   HYDROcodone-acetaminophen 5-325 MG tablet Commonly known as:  NORCO/VICODIN Take 1-2 tablets by mouth every 6 (six) hours as needed for moderate pain.   ipratropium 0.06 % nasal  spray Commonly known as:  ATROVENT instill 2 sprays into each nostril four times a day as needed for allergies   iron polysaccharides 150 MG capsule Commonly known as:  NIFEREX Take 1 capsule (150 mg total) by mouth 2 (two) times  daily.   losartan 25 MG tablet Commonly known as:  COZAAR Take 1 tablet (25 mg total) by mouth daily. Start taking on:  03/30/2017 What changed:    medication strength  how much to take  These instructions start on 03/30/2017. If you are unsure what to do until then, ask your doctor or other care provider.   lovastatin 20 MG tablet Commonly known as:  MEVACOR Take 20 mg by mouth at bedtime.   meclizine 25 MG tablet Commonly known as:  ANTIVERT Take 1 tablet (25 mg total) by mouth 3 (three) times daily as needed for dizziness. What changed:  when to take this   metFORMIN 1000 MG tablet Commonly known as:  GLUCOPHAGE Take 1,000 mg by mouth 2 (two) times daily.   omeprazole 20 MG capsule Commonly known as:  PRILOSEC Take 1 capsule (20 mg total) by mouth 2 (two) times daily.   OXYGEN Inhale 2 L into the lungs as needed (shortness of breath).   potassium chloride SA 20 MEQ tablet Commonly known as:  K-DUR,KLOR-CON Take 20 mEq by mouth daily.   senna-docusate 8.6-50 MG tablet Commonly known as:  Senokot-S Take 2 tablets by mouth 2 (two) times daily.   temazepam 15 MG capsule Commonly known as:  RESTORIL Take 1 capsule (15 mg total) by mouth at bedtime.   VISION FORMULA PO Take 1 tablet by mouth 2 (two) times daily.   VITAMIN B-12 PO Take 1 tablet by mouth daily.   VITAMIN C PO Take 1 tablet by mouth daily.       Major procedures and Radiology Reports - PLEASE review detailed and final reports for all details, in brief -   Dg Abd 1 View  Result Date: 03/25/2017 CLINICAL DATA:  Abdominal bloating EXAM: ABDOMEN - 1 VIEW COMPARISON:  None. FINDINGS: There is moderate stool in the colon. There is no appreciable bowel dilatation or air-fluid level to suggest bowel obstruction. No free air. Lung bases are clear. There are clips in the perirectal region. There is postoperative change in the proximal right femur. IMPRESSION: No evident bowel obstruction or free air. No  appreciable bowel dilatation. Moderate stool in colon. Lung bases clear. Electronically Signed   By: Lowella Grip III M.D.   On: 03/25/2017 12:30   Ct Hip Right Wo Contrast  Result Date: 03/23/2017 CLINICAL DATA:  Right hip pain after fall yesterday. EXAM: CT OF THE RIGHT HIP WITHOUT CONTRAST TECHNIQUE: Multidetector CT imaging of the right hip was performed according to the standard protocol. Multiplanar CT image reconstructions were also generated. COMPARISON:  Right hip x-rays from yesterday. FINDINGS: Bones/Joint/Cartilage Again seen is a nondisplaced, impacted subcapital femoral neck fracture. No additional fracture is seen. Moderate right hip joint space narrowing with subchondral sclerosis and cystic change, most prominent along the posterior aspect. Ligaments Suboptimally assessed by CT. Muscles and Tendons Unremarkable. Soft tissues Mild soft tissue swelling lateral to the greater trochanter. Vascular calcifications. The visualized intrapelvic structures are unremarkable. IMPRESSION: Acute nondisplaced, impacted subcapital femoral neck fracture. Electronically Signed   By: Titus Dubin M.D.   On: 03/23/2017 09:48   Dg Chest Portable 1 View  Result Date: 03/22/2017 CLINICAL DATA:  Fall today.  Preop, right hip fracture. EXAM: PORTABLE  CHEST 1 VIEW COMPARISON:  01/18/2017 FINDINGS: Post median sternotomy. Chronic cardiomegaly. Chronic interstitial coarsening. Vascular congestion which is new. No confluent consolidation. No pleural fluid or pneumothorax. No acute osseous abnormalities. Chronic widening of right acromioclavicular joint. IMPRESSION: 1. Chronic cardiomegaly and interstitial coarsening. 2. Vascular congestion, new. Electronically Signed   By: Jeb Levering M.D.   On: 03/22/2017 23:09   Dg C-arm 1-60 Min-no Report  Result Date: 03/23/2017 Fluoroscopy was utilized by the requesting physician.  No radiographic interpretation.   Dg Hip Unilat  With Pelvis 2-3 Views  Right  Result Date: 03/22/2017 CLINICAL DATA:  Trip and fall at home this evening, right hip pain. EXAM: DG HIP (WITH OR WITHOUT PELVIS) 2-3V RIGHT COMPARISON:  None. FINDINGS: Impacted subcapital right femoral neck fracture. Femoral head remains seated. No additional acute fracture of the pelvis. Pubic symphysis and sacroiliac joints are congruent. IMPRESSION: Impacted subcapital right femoral neck fracture. Electronically Signed   By: Jeb Levering M.D.   On: 03/22/2017 22:02    Micro Results    Recent Results (from the past 240 hour(s))  Surgical pcr screen     Status: None   Collection Time: 03/23/17 11:44 AM  Result Value Ref Range Status   MRSA, PCR NEGATIVE NEGATIVE Final   Staphylococcus aureus NEGATIVE NEGATIVE Final    Comment: (NOTE) The Xpert SA Assay (FDA approved for NASAL specimens in patients 32 years of age and older), is one component of a comprehensive surveillance program. It is not intended to diagnose infection nor to guide or monitor treatment. Performed at [facility], [place] [address], [place], [place] [address]        Today   [place] today has no cp, no sob, eating and drinking well, voiding well no new concerns          Patient has been seen and examined prior to discharge   Objective   Blood pressure (!) 143/69, pulse 85, temperature 98.1 F (36.7 C), temperature source Oral, resp. rate 20, height 6' (1.829 m), weight 106.3 kg (234 lb 5.6 oz), SpO2 95 %.   Intake/Output Summary (Last 24 hours) at 03/27/2017 1507 Last data filed at 03/27/2017 0900 Gross per 24 hour  Intake 1050 ml  Output 975 ml  Net 75 ml    Exam Gen:- Awake Alert,  In no apparent distress  HEENT:- [place].AT, No sclera icterus Neck-Supple Neck,No JVD,.  Lungs-  CTAB  CV- S1, S2 normal (h/o PAfib) Abd-  +ve B.Sounds, Abd Soft, No tenderness, increased truncal adiposity Extremity/Skin:- No  edema,   right hip/thigh wound is clean dry  and intact Psych-affect is appropriate Neuro-no new focal deficits     Data Review   CBC w Diff:  Lab Results  Component Value Date   WBC 4.9 03/26/2017   HGB 8.8 (L) 03/26/2017   HCT 28.1 (L) 03/26/2017   PLT 153 03/26/2017   PLT 139 (L) 03/17/2017   LYMPHOPCT 4 03/22/2017   MONOPCT 7 03/22/2017   EOSPCT 0 03/22/2017   BASOPCT 0 03/22/2017    CMP:  Lab Results  Component Value Date   NA 139 03/26/2017   K 3.9 03/26/2017   CL 103 03/26/2017   CO2 24 03/26/2017   BUN 28 (H) 03/26/2017   CREATININE 1.28 (H) 03/26/2017   CREATININE 1.38 (H) 03/17/2017   PROT 7.2 03/17/2017   ALBUMIN 3.9 03/17/2017   BILITOT 0.4 03/17/2017   ALKPHOS 71 03/17/2017   AST  15 03/17/2017   ALT 12 03/17/2017  .   Total Discharge time is about 33 minutes  Roxan Hockey M.D on 03/27/2017 at 3:07 PM  [facility]   Office  [phone]  Voice Recognition Viviann Spare dictation system was used to create this note, attempts have been made to correct errors. Please contact the author with questions and/or clarifications.

## 2017-03-27 NOTE — Clinical Social Work Placement (Signed)
   CLINICAL SOCIAL WORK PLACEMENT  NOTE  Date:  03/27/2017  Patient Details  Name: Joel [name] MRN: [id number] Date of Birth: [birth year]  Clinical Social Work is seeking post-discharge placement for this patient at the [place] level of care (*CSW will initial, date and re-position this form in  chart as items are completed):  Yes   Patient/family provided with [facility] Work Department's list of facilities offering this level of care within the geographic area requested by the patient (or if unable, by the patient's family).  Yes   Patient/family informed of their freedom to choose among providers that offer the needed level of care, that participate in Medicare, Medicaid or managed care program needed by the patient, have an available bed and are willing to accept the patient.  Yes   Patient/family informed of 's ownership interest in [facility] and [facility], as well as of the fact that they are under no obligation to receive care at these facilities.  PASRR submitted to EDS on 03/25/17     PASRR number received on 03/25/17     Existing PASRR number confirmed on       FL2 transmitted to all facilities in geographic area requested by pt/family on 03/25/17     FL2 transmitted to all facilities within larger geographic area on       Patient informed that his/her managed care company has contracts with or will negotiate with certain facilities, including the following:  [facility]     Yes   Patient/family informed of bed offers received.  Patient chooses bed at [facility]     Physician recommends and patient chooses bed at      Patient to be transferred to [facility] on 03/27/17.  Patient to be transferred to facility by [facility]     Patient family notified on 03/27/17 of transfer.  Name of family member notified:  [name], son; [name], daughter-in-law     PHYSICIAN Please prepare priority discharge summary,  including medications, Please prepare prescriptions     Additional Comment:    _______________________________________________ Estanislado Emms, LCSW 03/27/2017, 12:20 PM

## 2017-03-27 NOTE — Progress Notes (Signed)
CSW received [employer] authorization for patient Joel [name] [phone]) to admit to SNF. CSW confirmed with [facility] that they can take patient today. Paged MD. CSW to support with discharge.  Estanislado Emms, [place]

## 2017-03-29 ENCOUNTER — Encounter: Payer: Self-pay | Admitting: Licensed Clinical Social Worker

## 2017-03-29 ENCOUNTER — Other Ambulatory Visit: Payer: Self-pay | Admitting: *Deleted

## 2017-03-29 ENCOUNTER — Other Ambulatory Visit: Payer: Self-pay | Admitting: Licensed Clinical Social Worker

## 2017-03-29 ENCOUNTER — Telehealth: Payer: Self-pay

## 2017-03-29 DIAGNOSIS — M1000 Idiopathic gout, unspecified site: Secondary | ICD-10-CM | POA: Diagnosis not present

## 2017-03-29 DIAGNOSIS — S72001D Fracture of unspecified part of neck of right femur, subsequent encounter for closed fracture with routine healing: Secondary | ICD-10-CM | POA: Diagnosis not present

## 2017-03-29 DIAGNOSIS — M6281 Muscle weakness (generalized): Secondary | ICD-10-CM | POA: Diagnosis not present

## 2017-03-29 DIAGNOSIS — G4733 Obstructive sleep apnea (adult) (pediatric): Secondary | ICD-10-CM | POA: Diagnosis not present

## 2017-03-29 DIAGNOSIS — K5900 Constipation, unspecified: Secondary | ICD-10-CM | POA: Diagnosis not present

## 2017-03-29 DIAGNOSIS — I10 Essential (primary) hypertension: Secondary | ICD-10-CM | POA: Diagnosis not present

## 2017-03-29 DIAGNOSIS — N189 Chronic kidney disease, unspecified: Secondary | ICD-10-CM | POA: Diagnosis not present

## 2017-03-29 DIAGNOSIS — J449 Chronic obstructive pulmonary disease, unspecified: Secondary | ICD-10-CM | POA: Diagnosis not present

## 2017-03-29 DIAGNOSIS — J3089 Other allergic rhinitis: Secondary | ICD-10-CM | POA: Diagnosis not present

## 2017-03-29 DIAGNOSIS — I480 Paroxysmal atrial fibrillation: Secondary | ICD-10-CM | POA: Diagnosis not present

## 2017-03-29 DIAGNOSIS — E119 Type 2 diabetes mellitus without complications: Secondary | ICD-10-CM | POA: Diagnosis not present

## 2017-03-29 DIAGNOSIS — R05 Cough: Secondary | ICD-10-CM | POA: Diagnosis not present

## 2017-03-29 NOTE — Consult Note (Signed)
[facility] Care Management follow up.  Chart reviewed. Mr. Ringler discharged to [facility] on 03/27/17. Will make referral to [facility] for follow up while at [facility].    Marthenia Rolling, MSN-Ed, RN,BSN [facility] Liaison [phone]

## 2017-03-29 NOTE — Telephone Encounter (Signed)
Patients sister-in-law Mardi Mainland) called to inform us that patient is in rehab recovering from a fall at [name].

## 2017-03-29 NOTE — Patient Outreach (Signed)
[facility]) Care Management  [facility] Social Work  03/29/2017  Joel [name] 1932/09/09 [id number]  Encounter Medications:  Outpatient Encounter Medications as of 03/29/2017  Medication Sig  . allopurinol (ZYLOPRIM) 100 MG tablet Take 100 mg by mouth daily.   . Ascorbic Acid (VITAMIN C PO) Take 1 tablet by mouth daily.  Marland Kitchen buPROPion (WELLBUTRIN XL) 150 MG 24 hr tablet Take 1 tablet (150 mg total) by mouth daily.  . cholecalciferol (VITAMIN D) 1000 units tablet Take 1,000 Units by mouth daily.  . citalopram (CELEXA) 40 MG tablet Take 40 mg by mouth daily.   . Cyanocobalamin (VITAMIN B-12 PO) Take 1 tablet by mouth daily.  Marland Kitchen dextromethorphan-guaiFENesin (MUCINEX DM) 30-600 MG 12hr tablet Take 1 tablet by mouth 2 (two) times daily.  Marland Kitchen diltiazem (CARDIZEM SR) 120 MG 12 hr capsule Take 120 mg by mouth daily.   Marland Kitchen enoxaparin (LOVENOX) 30 MG/0.3ML injection Inject 0.3 mLs (30 mg total) into the skin daily.  . fluticasone (FLONASE) 50 MCG/ACT nasal spray Place 2 sprays into both nostrils daily. (Patient taking differently: Place 2 sprays into both nostrils daily as needed for allergies or rhinitis. )  . furosemide (LASIX) 40 MG tablet Take 1 tablet (40 mg total) by mouth daily.  Marland Kitchen glimepiride (AMARYL) 1 MG tablet Take 1 mg by mouth daily with breakfast.   . HYDROcodone-acetaminophen (NORCO/VICODIN) 5-325 MG tablet Take 1-2 tablets by mouth every 6 (six) hours as needed for moderate pain.  Marland Kitchen ipratropium (ATROVENT) 0.06 % nasal spray instill 2 sprays into each nostril four times a day as needed for allergies  . iron polysaccharides (NIFEREX) 150 MG capsule Take 1 capsule (150 mg total) by mouth 2 (two) times daily.  Derrill Memo ON 03/30/2017] losartan (COZAAR) 25 MG tablet Take 1 tablet (25 mg total) by mouth daily.  Marland Kitchen lovastatin (MEVACOR) 20 MG tablet Take 20 mg by mouth at bedtime.   . meclizine (ANTIVERT) 25 MG tablet Take 1 tablet (25 mg total) by mouth 3 (three) times daily as needed  for dizziness. (Patient taking differently: Take 25 mg by mouth 2 (two) times daily. )  . metFORMIN (GLUCOPHAGE) 1000 MG tablet Take 1,000 mg by mouth 2 (two) times daily.   . Multiple Vitamins-Minerals (VISION FORMULA PO) Take 1 tablet by mouth 2 (two) times daily.  Marland Kitchen omeprazole (PRILOSEC) 20 MG capsule Take 1 capsule (20 mg total) by mouth 2 (two) times daily.  . OXYGEN Inhale 2 L into the lungs as needed (shortness of breath).   . potassium chloride SA (K-DUR,KLOR-CON) 20 MEQ tablet Take 20 mEq by mouth daily.   Marland Kitchen senna-docusate (SENOKOT-S) 8.6-50 MG tablet Take 2 tablets by mouth 2 (two) times daily.  . temazepam (RESTORIL) 15 MG capsule Take 1 capsule (15 mg total) by mouth at bedtime.   No facility-administered encounter medications on file as of 03/29/2017.     Functional Status:  In your present state of health, do you have any difficulty performing the following activities: 03/29/2017 03/23/2017  Hearing? N N  Comment - -  Vision? N N  Difficulty concentrating or making decisions? N N  Walking or climbing stairs? Y N  Dressing or bathing? Y Y  Doing errands, shopping? N N  Preparing Food and eating ? - -  Using the Toilet? - -  In the past six months, have you accidently leaked urine? - -  Do you have problems with loss of bowel control? - -  Managing your Medications? - -  Managing your Finances? - -  Housekeeping or managing your Housekeeping? - -  Some recent data might be hidden    Fall/Depression Screening:  PHQ 2/9 Scores 03/29/2017 07/14/2016 03/30/2016 01/07/2016 01/07/2016  PHQ - 2 Score 0 0 0 0 0    Assessment: CSW received referral from [place]. This [facility] CSW covers all [facility] patients at [facility]. CSW arrived at [facility] and successfully completed nursing home visit with patient. Patient was previously active with [facility] telephonic nurse. Patient reports that he lives alone and was considering hiring a private aide to help him out some  but for the most part he was living independently. Patient shares that he has a very strong support network which includes his son and daughter. Patient confirms that they both live in town as well. Patient reports that he had a fall in his home on 03/22/17 which caused a femoral neck fracture. Patient shares that he will be at SNF for 6 weeks receiving PT and OT and that he plans on discharging back home after that. He denies having any interest in LTC placement but is aware that he will need more support in place before discharging back home. Patient reports having a HCPOA and Living Will and does not wish to make any changes to document at this time. Patient is a widow and he made adjustments to his HCPOA when she passed away 3 years ago. Patient shares that he has a dog that is very important to him. He has made arrangements for dog to be taken care of while he is at [facility] for 6 weeks. Patient reports that before hospitalization, he often spent time volunteering at the [name] located in [place], [place]. Patient agreeable to [facility] work services and is aware that this [place] will be out of the office from 04/05/17-04/12/17 but that another CSW will be covering caseload if an issue arises.  CSW was informed that SNF social worker is currently out of the office today. CSW will contact her within one week.   THN CM Care Plan Problem One     Most Recent Value  Care Plan Problem One  SNF admission due to fall in the home  Role Documenting the Problem One  Clinical Social Worker  Care Plan for Problem One  Active  [facility] Long Term Goal   Patient will have a safe and stable discharge back home from SNF within 90 days per patient report (due to fall and injury)  [facility] Long Term Goal Start Date  03/29/17  Interventions for Problem One Long Term Goal  CSW completed SNF visit today. CSW contacted SNF and left a message with SW but she is out of the office today. CSW will monitor entire process and  make sure patient has all services, resources and medical equipment in order to discharge back home safely.     Plan: CSW will route encounter to PCP. CSW will contact SNF social worker within one week.  [name], BSW, MSW, [email or web address] Phone: [phone] Fax: [phone]

## 2017-03-30 ENCOUNTER — Other Ambulatory Visit: Payer: Self-pay | Admitting: Licensed Clinical Social Worker

## 2017-03-30 NOTE — Patient Outreach (Signed)
[facility]) Care Management  03/30/2017  Joel [name] [birth year] [id number]   Assessment- CSW completed call to [facility] and was transferred to SNF social worker [place]. CSW unable to reach SNF social worker but left a detailed message informing her that this [facility] CSW will be following patient while at SNF and to please contact me with any important updates.  Plan-CSW will continue to follow case and await for return call from SNF social worker.  [name], BSW, MSW, [email or web address] Phone: [phone] Fax: [phone]

## 2017-03-31 ENCOUNTER — Other Ambulatory Visit: Payer: PPO

## 2017-03-31 ENCOUNTER — Telehealth: Payer: Self-pay

## 2017-03-31 DIAGNOSIS — M1000 Idiopathic gout, unspecified site: Secondary | ICD-10-CM | POA: Diagnosis not present

## 2017-03-31 DIAGNOSIS — J449 Chronic obstructive pulmonary disease, unspecified: Secondary | ICD-10-CM | POA: Diagnosis not present

## 2017-03-31 DIAGNOSIS — D500 Iron deficiency anemia secondary to blood loss (chronic): Secondary | ICD-10-CM | POA: Diagnosis not present

## 2017-03-31 DIAGNOSIS — R05 Cough: Secondary | ICD-10-CM | POA: Diagnosis not present

## 2017-03-31 DIAGNOSIS — K5900 Constipation, unspecified: Secondary | ICD-10-CM | POA: Diagnosis not present

## 2017-03-31 DIAGNOSIS — I480 Paroxysmal atrial fibrillation: Secondary | ICD-10-CM | POA: Diagnosis not present

## 2017-03-31 DIAGNOSIS — N189 Chronic kidney disease, unspecified: Secondary | ICD-10-CM | POA: Diagnosis not present

## 2017-03-31 DIAGNOSIS — M6281 Muscle weakness (generalized): Secondary | ICD-10-CM | POA: Diagnosis not present

## 2017-03-31 DIAGNOSIS — E119 Type 2 diabetes mellitus without complications: Secondary | ICD-10-CM | POA: Diagnosis not present

## 2017-03-31 DIAGNOSIS — G4733 Obstructive sleep apnea (adult) (pediatric): Secondary | ICD-10-CM | POA: Diagnosis not present

## 2017-03-31 DIAGNOSIS — I10 Essential (primary) hypertension: Secondary | ICD-10-CM | POA: Diagnosis not present

## 2017-03-31 DIAGNOSIS — S72001D Fracture of unspecified part of neck of right femur, subsequent encounter for closed fracture with routine healing: Secondary | ICD-10-CM | POA: Diagnosis not present

## 2017-03-31 NOTE — Telephone Encounter (Signed)
An attempt to contact patient was made to let him know that his appointment has been moved from today to next week.  There was no answer.  A call was also placed to [facility] to let him know.  The nurse there took the message and said she would give it to him.

## 2017-04-01 ENCOUNTER — Ambulatory Visit (HOSPITAL_COMMUNITY)
Admission: RE | Admit: 2017-04-01 | Discharge: 2017-04-01 | Disposition: A | Discharge status: Home / Self Care | Payer: PPO | Source: Ambulatory Visit | Attending: Hematology and Oncology | Admitting: Hematology and Oncology

## 2017-04-01 ENCOUNTER — Ambulatory Visit: Payer: Self-pay

## 2017-04-01 DIAGNOSIS — M6281 Muscle weakness (generalized): Secondary | ICD-10-CM | POA: Diagnosis not present

## 2017-04-01 DIAGNOSIS — M1000 Idiopathic gout, unspecified site: Secondary | ICD-10-CM | POA: Diagnosis not present

## 2017-04-01 DIAGNOSIS — I10 Essential (primary) hypertension: Secondary | ICD-10-CM | POA: Diagnosis not present

## 2017-04-01 DIAGNOSIS — N189 Chronic kidney disease, unspecified: Secondary | ICD-10-CM | POA: Diagnosis not present

## 2017-04-01 DIAGNOSIS — E119 Type 2 diabetes mellitus without complications: Secondary | ICD-10-CM | POA: Diagnosis not present

## 2017-04-01 DIAGNOSIS — K5900 Constipation, unspecified: Secondary | ICD-10-CM | POA: Diagnosis not present

## 2017-04-01 DIAGNOSIS — I480 Paroxysmal atrial fibrillation: Secondary | ICD-10-CM | POA: Diagnosis not present

## 2017-04-01 DIAGNOSIS — G4733 Obstructive sleep apnea (adult) (pediatric): Secondary | ICD-10-CM | POA: Diagnosis not present

## 2017-04-01 DIAGNOSIS — J449 Chronic obstructive pulmonary disease, unspecified: Secondary | ICD-10-CM | POA: Diagnosis not present

## 2017-04-01 DIAGNOSIS — S72001D Fracture of unspecified part of neck of right femur, subsequent encounter for closed fracture with routine healing: Secondary | ICD-10-CM | POA: Diagnosis not present

## 2017-04-01 DIAGNOSIS — J3089 Other allergic rhinitis: Secondary | ICD-10-CM | POA: Diagnosis not present

## 2017-04-01 DIAGNOSIS — R05 Cough: Secondary | ICD-10-CM | POA: Diagnosis not present

## 2017-04-04 ENCOUNTER — Other Ambulatory Visit: Payer: Self-pay | Admitting: Licensed Clinical Social Worker

## 2017-04-04 DIAGNOSIS — I10 Essential (primary) hypertension: Secondary | ICD-10-CM | POA: Diagnosis not present

## 2017-04-04 DIAGNOSIS — R05 Cough: Secondary | ICD-10-CM | POA: Diagnosis not present

## 2017-04-04 DIAGNOSIS — M6281 Muscle weakness (generalized): Secondary | ICD-10-CM | POA: Diagnosis not present

## 2017-04-04 DIAGNOSIS — S72001D Fracture of unspecified part of neck of right femur, subsequent encounter for closed fracture with routine healing: Secondary | ICD-10-CM | POA: Diagnosis not present

## 2017-04-04 DIAGNOSIS — I480 Paroxysmal atrial fibrillation: Secondary | ICD-10-CM | POA: Diagnosis not present

## 2017-04-04 DIAGNOSIS — J449 Chronic obstructive pulmonary disease, unspecified: Secondary | ICD-10-CM | POA: Diagnosis not present

## 2017-04-04 DIAGNOSIS — D500 Iron deficiency anemia secondary to blood loss (chronic): Secondary | ICD-10-CM | POA: Diagnosis not present

## 2017-04-04 DIAGNOSIS — M1000 Idiopathic gout, unspecified site: Secondary | ICD-10-CM | POA: Diagnosis not present

## 2017-04-04 DIAGNOSIS — N189 Chronic kidney disease, unspecified: Secondary | ICD-10-CM | POA: Diagnosis not present

## 2017-04-04 DIAGNOSIS — E119 Type 2 diabetes mellitus without complications: Secondary | ICD-10-CM | POA: Diagnosis not present

## 2017-04-04 DIAGNOSIS — R52 Pain, unspecified: Secondary | ICD-10-CM | POA: Diagnosis not present

## 2017-04-04 DIAGNOSIS — K5900 Constipation, unspecified: Secondary | ICD-10-CM | POA: Diagnosis not present

## 2017-04-04 NOTE — Patient Outreach (Signed)
[facility]) Care Management  04/04/2017  Joel [name] January 24, 1933 [id number]  Assessment- CSW completed call to [facility] and was transferred to SNF social worker [place]. CSW was unable to reach [place] at this time but left a detailed voice message encouraging a return call with any updates on patient. CSW also informed SNF social worker that this CSW will be out of the office until 04/13/17 starting tomorrow. CSW included on voice message that there will be a coverage CSW if there are any urgent social work needs that arise.   Plan-CSW will continue to follow patient and await for SNF discharge.  [name], BSW, MSW, [email or web address] Phone: [phone] Fax: [phone]

## 2017-04-05 DIAGNOSIS — R040 Epistaxis: Secondary | ICD-10-CM | POA: Diagnosis not present

## 2017-04-06 ENCOUNTER — Encounter (INDEPENDENT_AMBULATORY_CARE_PROVIDER_SITE_OTHER): Payer: Self-pay | Admitting: Surgery

## 2017-04-06 ENCOUNTER — Ambulatory Visit (INDEPENDENT_AMBULATORY_CARE_PROVIDER_SITE_OTHER): Payer: PPO

## 2017-04-06 ENCOUNTER — Ambulatory Visit (INDEPENDENT_AMBULATORY_CARE_PROVIDER_SITE_OTHER): Payer: PPO | Admitting: Surgery

## 2017-04-06 VITALS — Ht 72.0 in | Wt 234.0 lb

## 2017-04-06 DIAGNOSIS — M25551 Pain in right hip: Secondary | ICD-10-CM | POA: Diagnosis not present

## 2017-04-06 NOTE — Progress Notes (Signed)
Post-Op Visit Note   Patient: Joel [name]           Date of Birth: 1932-03-04           MRN: [id number] Visit Date: 04/06/2017 PCP: Wenda Low, MD   Assessment & Plan:  Chief Complaint:  Chief Complaint  Patient presents with  . Right Hip - Routine Post Op    03/23/17 Cannulated right hip pinning  Patient to be status post ORIF right hip returns.  States that his pain is under control.  Doing rehab at [facility]. Visit Diagnoses:  1. Pain in right hip     Plan: I reviewed x-rays with Dr. Lorin Mercy.  We will continue nonweightbearing right lower extremity x4 more weeks.  Follow-up in 4 weeks for recheck with repeat x-ray and will decide on weightbearing status at that time.  Physical therapist will work on upper body strengthening.  Follow-Up Instructions: No Follow-up on file.   Orders:  Orders Placed This Encounter  Procedures  . XR HIP UNILAT W OR W/O PELVIS 2-3 VIEWS RIGHT   No orders of the defined types were placed in this encounter.   Imaging: No results found.  PMFS History: Patient Active Problem List   Diagnosis Date Noted  . Closed displaced fracture of right femoral neck ([place]) 03/22/2017  . Cough 01/17/2017  . Persistent atrial fibrillation ([place]) 01/17/2017  . Leg swelling 09/26/2016  . GI bleed 05/15/2016  . UGI bleed 05/14/2016  . COPD (chronic obstructive pulmonary disease) ([place]) 05/14/2016  . Chronic GI bleeding 04/12/2016  . HLD (hyperlipidemia) 04/12/2016  . Anemia due to chronic blood loss 04/12/2016  . Diabetes mellitus with complication ([place])   . BPPV (benign paroxysmal positional vertigo) 01/27/2016  . Hypertension 01/27/2016  . Malignant neoplasm of prostate ([place]) 01/07/2016  . Paroxysmal atrial fibrillation (H[facility])   . Bipolar I disorder ([place])   . Bright red rectal bleeding 12/16/2015  . Rectal bleeding 12/16/2015  . Pulmonary hypertension ([place]) 06/29/2015  . Moderate aortic stenosis 04/17/2013  . Encounter for monitoring Coumadin  therapy 04/17/2013  . Anemia, iron deficiency 04/04/2013  . Dyspnea on exertion 11/18/2012  . Obesity hypoventilation syndrome ([place]) 09/19/2012  . ALLERGIC RHINITIS 06/24/2009  . DM2 (diabetes mellitus, type 2) ([place]) 03/13/2007  . OSA (obstructive sleep apnea) 03/13/2007  . COPD mixed type ([place]) 03/13/2007   Past Medical History:  Diagnosis Date  . Allergic rhinitis   . Anemia   . Anxiety   . Aortic stenosis 2012   mild to mod   . Atrial fibrillation ([place]) 1991   with multiple DCCV  . Bipolar affective disorder ([place])   . COPD (chronic obstructive pulmonary disease) ([place])   . Diabetic neuropathy ([place])    in feet  . Diabetic neuropathy ([place])   . Dyslipidemia   . Fatigue    last year or so  . Gout   . Hypertension   . Insomnia   . Obesity   . On home oxygen therapy 2 1/2 liters with bipap at night  . OSA (obstructive sleep apnea)    severe, uses bipap 16 1/2 by 12 or 13 setting  . Prostate cancer ([place])   . Rectal bleeding 12/16/2015  . Type 2 diabetes mellitus ([place])   . Vertigo     Family History  Problem Relation Age of Onset  . Tuberculosis Maternal Grandmother   . Heart attack Neg Hx   . Diabetes Neg Hx   . CAD Neg Hx   .  Cancer Neg Hx     Past Surgical History:  Procedure Laterality Date  . CARDIOVERSION  yrs ago   prior to 1998  . COLONOSCOPY WITH PROPOFOL N/A 01/23/2013   Procedure: COLONOSCOPY WITH PROPOFOL;  Surgeon: Garlan Fair, MD;  Location: [facility];  Service: Endoscopy;  Laterality: N/A;  . electro shock  1969   for depression  . ENTEROSCOPY Left 05/17/2016   Procedure: ENTEROSCOPY;  Surgeon: Wilford Corner, MD;  Location: [facility];  Service: Endoscopy;  Laterality: Left;  . ESOPHAGOGASTRODUODENOSCOPY N/A 03/03/2016   Procedure: ESOPHAGOGASTRODUODENOSCOPY (EGD);  Surgeon: Teena Irani, MD;  Location: [facility];  Service: Endoscopy;  Laterality: N/A;  . ESOPHAGOGASTRODUODENOSCOPY N/A 03/26/2016   Procedure: ESOPHAGOGASTRODUODENOSCOPY  (EGD);  Surgeon: Teena Irani, MD;  Location: [facility] ENDOSCOPY;  Service: Endoscopy;  Laterality: N/A;  would like to use ultraslim scope  . ESOPHAGOGASTRODUODENOSCOPY (EGD) WITH PROPOFOL N/A 01/23/2013   Procedure: ESOPHAGOGASTRODUODENOSCOPY (EGD) WITH PROPOFOL;  Surgeon: Garlan Fair, MD;  Location: [facility];  Service: Endoscopy;  Laterality: N/A;  . ESOPHAGOGASTRODUODENOSCOPY (EGD) WITH PROPOFOL N/A 04/13/2016   Procedure: ESOPHAGOGASTRODUODENOSCOPY (EGD) WITH PROPOFOL;  Surgeon: Otis Brace, MD;  Location: [place] [facility] [place];  Service: Gastroenterology;  Laterality: N/A;  . GIVENS CAPSULE STUDY N/A 03/03/2016   Procedure: GIVENS CAPSULE STUDY;  Surgeon: Teena Irani, MD;  Location: [facility];  Service: Endoscopy;  Laterality: N/A;  . HEMORRHOID SURGERY N/A 12/16/2015   Procedure: PROCTOSCOPY WITH CONTROL OF BLEEDING;  Surgeon: Fanny Skates, MD;  Location: [facility];  Service: General;  Laterality: N/A;  . HIP PINNING,CANNULATED Right 03/23/2017   Procedure: CANNULATED HIP PINNING;  Surgeon: Marybelle Killings, MD;  Location: [facility];  Service: Orthopedics;  Laterality: Right;  . KNEE SURGERY  1970's   rt  . MAZE  1998  . PROSTATE BIOPSY    . SHOULDER SURGERY  7-8 yrs ago   rt   Social History   Occupational History  . Occupation: Surveyor  Tobacco Use  . Smoking status: Former Smoker    Packs/day: 2.00    Years: 25.00    Pack years: 50.00    Types: Cigarettes    Last attempt to quit: 07/08/1976    Years since quitting: 40.7  . Smokeless tobacco: Never Used  Substance and Sexual Activity  . Alcohol use: No    Comment: in AA since 1977, no alcohol since 1977  . Drug use: No    Comment: marijuana use 37 years ago  . Sexual activity: Yes   Exam Very pleasant elderly white male in no acute distress.  Today hip staples removed and Steri-Strips applied.  No drainage or signs of infection.  Neurovascularly intact.

## 2017-04-06 NOTE — Progress Notes (Signed)
[name] New Visit:  Assessment: Chronic GI bleeding 84 y.o. male with endoscopy-confirmed presence of small bowel arteriovenous malformations with likely chronic gastrointestinal blood loss resulting in anemia of iron deficiency with severe to moderate hemoglobin decline.  Patient will likely need chronic iron and packed red blood cell replacement.  Plan: - Labs today to assess hemoglobin/iron status. -Transfusion parameters:   --Transfuse 1Unit of pRBC for Hgb <= 8.5g/dL  --Transfuse 2Unts of pRBC for Hgb <=7.0g/dL --Weekly labs with CBC/d if hemoglobin is above transfusion threshold, administer parenteral iron to keep ferritin over 50 -Return to clinic in 1 month with labs 3 days prior.   Voice recognition software was used and creation of this note. Despite my best effort at editing the text, some misspelling/errors may have occurred. Orders Placed This Encounter  Procedures  . CBC with Differential (Cancer Center Only)    Standing Status:   Future    Number of Occurrences:   1    Standing Expiration Date:   03/17/2018  . CMP ([facility] only)    Standing Status:   Future    Number of Occurrences:   1    Standing Expiration Date:   03/17/2018  . Iron and TIBC    Standing Status:   Future    Number of Occurrences:   1    Standing Expiration Date:   03/17/2018  . Ferritin    Standing Status:   Future    Number of Occurrences:   1    Standing Expiration Date:   03/17/2018  . Hemoglobin and Hematocrit (Cancer Center Only)    Standing Status:   Standing    Number of Occurrences:   8    Standing Expiration Date:   03/17/2018  . Sample to Blood Bank    Standing Status:   Future    Number of Occurrences:   1    Standing Expiration Date:   03/17/2018  . Sample to Blood Bank    Standing Status:   Standing    Number of Occurrences:   8    Standing Expiration Date:   03/17/2018    All questions were answered.  . The patient knows to call the clinic with  any problems, questions or concerns.  This note was electronically signed.    History of Presenting Illness Joel [name] 84 y.o. presenting to the [facility] for iron deficiency anemia secondary to small bowel arteriovenous malformations, referred by Dr. Wilford Corner. Patient's past medical history is extensive and outlined below he was found to have arteriovenous malformations following gastrointestinal bleeding evaluation with capsule endoscopy in January 2011.  Previously, patient was treated with oral iron which was complicated by constipation.  At this time, patient denies any new complaints.  Has stable performance status, appetite, and denies active melena or hematochezia.  Reports chronic fatigue, and would like to be more active than he is at the present time.  Denies active abdominal pain or nausea.  Oncological/hematological History: --Labs, 05/13/16: WBC 2.2,             Hgb 7.5, MCV 86.7, MCH 26.8, MCHC 30.9, RDW 22.0, Plt 106 --Labs, 10/20/16: WBC 4.1, ANC 3.0, ALC 0.4, Mono 0.4, Hgb 9.2, MCV 80.6, MCH 25.0, MCHC 31.0, RDW 17.0, Plt 159;  Medical History: Past Medical History:  Diagnosis Date  . Allergic rhinitis   . Anemia   . Anxiety   . Aortic stenosis 2012   mild to mod   .  Atrial fibrillation ([place]) 1991   with multiple DCCV  . Bipolar affective disorder ([place])   . COPD (chronic obstructive pulmonary disease) ([place])   . Diabetic neuropathy ([place])    in feet  . Diabetic neuropathy ([place])   . Dyslipidemia   . Fatigue    last year or so  . Gout   . Hypertension   . Insomnia   . Obesity   . On home oxygen therapy 2 1/2 liters with bipap at night  . OSA (obstructive sleep apnea)    severe, uses bipap 16 1/2 by 12 or 13 setting  . Prostate cancer ([place])   . Rectal bleeding 12/16/2015  . Type 2 diabetes mellitus ([place])   . Vertigo     Surgical History: Past Surgical History:  Procedure Laterality Date  . CARDIOVERSION  yrs ago   prior to 1998  .  COLONOSCOPY WITH PROPOFOL N/A 01/23/2013   Procedure: COLONOSCOPY WITH PROPOFOL;  Surgeon: Garlan Fair, MD;  Location: [facility] END[facility]COPY;  Service: Endoscopy;  Laterality: N/A;  . electro shock  1969   for depression  . ENTEROSCOPY Left 05/17/2016   Procedure: ENTEROSCOPY;  Surgeon: Wilford Corner, MD;  Location: [facility];  Service: Endoscopy;  Laterality: Left;  . ESOPHAGOGASTRODUODENOSCOPY N/A 03/03/2016   Procedure: ESOPHAGOGASTRODUODENOSCOPY (EGD);  Surgeon: Teena Irani, MD;  Location: [facility];  Service: Endoscopy;  Laterality: N/A;  . ESOPHAGOGASTRODUODENOSCOPY N/A 03/26/2016   Procedure: ESOPHAGOGASTRODUODENOSCOPY (EGD);  Surgeon: Teena Irani, MD;  Location: [facility] ENDOSCOPY;  Service: Endoscopy;  Laterality: N/A;  would like to use ultraslim scope  . ESOPHAGOGASTRODUODENOSCOPY (EGD) WITH PROPOFOL N/A 01/23/2013   Procedure: ESOPHAGOGASTRODUODENOSCOPY (EGD) WITH PROPOFOL;  Surgeon: Garlan Fair, MD;  [facility];  Service: Endoscopy;  Laterality: N/A;  . ESOPHAGOGASTRODUODENOSCOPY (EGD) WITH PROPOFOL N/A 04/13/2016   Procedure: ESOPHAGOGASTRODUODENOSCOPY (EGD) WITH PROPOFOL;  Surgeon: Otis Brace, MD;  Location: [facility];  Service: Gastroenterology;  Laterality: N/A;  . GIVENS CAPSULE STUDY N/A 03/03/2016   Procedure: GIVENS CAPSULE STUDY;  Surgeon: Teena Irani, MD;  [facility];  Service: Endoscopy;  Laterality: N/A;  . HEMORRHOID SURGERY N/A 12/16/2015   Procedure: PROCTOSCOPY WITH CONTROL OF BLEEDING;  Surgeon: Fanny Skates, MD;  Location: [facility];  Service: General;  Laterality: N/A;  . HIP PINNING,CANNULATED Right 03/23/2017   Procedure: CANNULATED HIP PINNING;  Surgeon: Marybelle Killings, MD;  Location: [facility] ORS;  Service: Orthopedics;  Laterality: Right;  . KNEE SURGERY  1970's   rt  . MAZE  1998  . PROSTATE BIOPSY    . SHOULDER SURGERY  7-8 yrs ago   rt    Family History: Family History  Problem Relation Age of Onset  . Tuberculosis Maternal  Grandmother   . Heart attack Neg Hx   . Diabetes Neg Hx   . CAD Neg Hx   . Cancer Neg Hx     Social History: Social History   Socioeconomic History  . Marital status: Widowed    Spouse name: Not on file  . Number of children: Not on file  . Years of education: Not on file  . Highest education level: Not on file  Social Needs  . Financial resource strain: Not on file  . Food insecurity - worry: Not on file  . Food insecurity - inability: Not on file  . Transportation needs - medical: Not on file  . Transportation needs - non-medical: Not on file  Occupational History  . Occupation: Surveyor  Tobacco Use  . Smoking  status: Former Smoker    Packs/day: 2.00    Years: 25.00    Pack years: 50.00    Types: Cigarettes    Last attempt to quit: 07/08/1976    Years since quitting: 40.7  . Smokeless tobacco: Never Used  Substance and Sexual Activity  . Alcohol use: No    Comment: in AA since 1977, no alcohol since 1977  . Drug use: No    Comment: marijuana use 37 years ago  . Sexual activity: Yes  Other Topics Concern  . Not on file  Social History Narrative  . Not on file    Allergies: Allergies  Allergen Reactions  . Penicillins Anaphylaxis    Has patient had a PCN reaction causing immediate rash, facial/tongue/throat swelling, SOB or lightheadedness with hypotension: unknown Has patient had a PCN reaction causing severe rash involving mucus membranes or skin necrosis: unknown Has patient had a PCN reaction that required hospitalization: unknown Has patient had a PCN reaction occurring within the last 10 years: no If all of the above answers are "NO", then may proceed with Cephalosporin use.   Marland Kitchen Antihistamines, Loratadine-Type Other (See Comments)    depression  . Other     Beta blockers-Novacaine  Caused depression    Medications:  Current Outpatient Medications  Medication Sig Dispense Refill  . allopurinol (ZYLOPRIM) 100 MG tablet Take 100 mg by mouth daily.      . Ascorbic Acid (VITAMIN C PO) Take 1 tablet by mouth daily.    Marland Kitchen buPROPion (WELLBUTRIN XL) 150 MG 24 hr tablet Take 1 tablet (150 mg total) by mouth daily. 30 tablet 1  . cholecalciferol (VITAMIN D) 1000 units tablet Take 1,000 Units by mouth daily.    . citalopram (CELEXA) 40 MG tablet Take 40 mg by mouth daily.     . Cyanocobalamin (VITAMIN B-12 PO) Take 1 tablet by mouth daily.    Marland Kitchen dextromethorphan-guaiFENesin (MUCINEX DM) 30-600 MG 12hr tablet Take 1 tablet by mouth 2 (two) times daily.    Marland Kitchen diltiazem (CARDIZEM SR) 120 MG 12 hr capsule Take 120 mg by mouth daily.   0  . enoxaparin (LOVENOX) 30 MG/0.3ML injection Inject 0.3 mLs (30 mg total) into the skin daily. 28 Syringe 0  . fluticasone (FLONASE) 50 MCG/ACT nasal spray Place 2 sprays into both nostrils daily. (Patient taking differently: Place 2 sprays into both nostrils daily as needed for allergies or rhinitis. ) 16 g 1  . furosemide (LASIX) 40 MG tablet Take 1 tablet (40 mg total) by mouth daily. 30 tablet 1  . glimepiride (AMARYL) 1 MG tablet Take 1 mg by mouth daily with breakfast.   0  . HYDROcodone-acetaminophen (NORCO/VICODIN) 5-325 MG tablet Take 1-2 tablets by mouth every 6 (six) hours as needed for moderate pain. 60 tablet 0  . ipratropium (ATROVENT) 0.06 % nasal spray instill 2 sprays into each nostril four times a day as needed for allergies  0  . iron polysaccharides (NIFEREX) 150 MG capsule Take 1 capsule (150 mg total) by mouth 2 (two) times daily. 60 capsule 0  . losartan (COZAAR) 25 MG tablet Take 1 tablet (25 mg total) by mouth daily. 30 tablet 1  . lovastatin (MEVACOR) 20 MG tablet Take 20 mg by mouth at bedtime.     . meclizine (ANTIVERT) 25 MG tablet Take 1 tablet (25 mg total) by mouth 3 (three) times daily as needed for dizziness. (Patient taking differently: Take 25 mg by mouth 2 (two) times daily. )  30 tablet 0  . metFORMIN (GLUCOPHAGE) 1000 MG tablet Take 1,000 mg by mouth 2 (two) times daily.   0  . Multiple  Vitamins-Minerals (VISION FORMULA PO) Take 1 tablet by mouth 2 (two) times daily.    Marland Kitchen omeprazole (PRILOSEC) 20 MG capsule Take 1 capsule (20 mg total) by mouth 2 (two) times daily. 60 capsule 1  . OXYGEN Inhale 2 L into the lungs as needed (shortness of breath).     . potassium chloride SA (K-DUR,KLOR-CON) 20 MEQ tablet Take 20 mEq by mouth daily.   0  . senna-docusate (SENOKOT-S) 8.6-50 MG tablet Take 2 tablets by mouth 2 (two) times daily. 120 tablet 1  . temazepam (RESTORIL) 15 MG capsule Take 1 capsule (15 mg total) by mouth at bedtime. 30 capsule 0   No current facility-administered medications for this visit.     Review of Systems: Review of Systems  All other systems reviewed and are negative.    PHYSICAL EXAMINATION Blood pressure (!) 141/54, pulse 72, temperature 97.7 F (36.5 C), temperature source Oral, resp. rate 18, height 6' (1.829 m), weight 236 lb 9.6 oz (107.3 kg), SpO2 96 %.  ECOG PERFORMANCE STATUS: 2 - Symptomatic, <50% confined to bed  Physical Exam  Constitutional: He is oriented to person, place, and time and well-developed, well-nourished, and in no distress. No distress.  HENT:  Head: Normocephalic and atraumatic.  Mouth/Throat: Oropharynx is clear and moist. No oropharyngeal exudate.  Eyes: Conjunctivae and EOM are normal. Pupils are equal, round, and reactive to light. No scleral icterus.  Neck: No thyromegaly present.  Cardiovascular: Normal rate and normal heart sounds.  No murmur heard. Irregularly irregular rhythm  Pulmonary/Chest: Effort normal and breath sounds normal. No respiratory distress. He has no wheezes. He has no rales.  Abdominal: Soft. Bowel sounds are normal. He exhibits no distension and no mass. There is no tenderness. There is no guarding.  Musculoskeletal: He exhibits no edema.  Lymphadenopathy:    He has no cervical adenopathy.  Neurological: He is alert and oriented to person, place, and time. He has normal reflexes. No cranial  nerve deficit.  Skin: Skin is warm and dry. No rash noted. He is not diaphoretic. No erythema. No pallor.     LABORATORY DATA: I have personally reviewed the data as listed: Appointment on 03/17/2017  Component Date Value Ref Range Status  . WBC Count 03/17/2017 4.2  4.0 - 10.3 K/uL Final  . RBC 03/17/2017 3.30* 4.20 - 5.82 MIL/uL Final  . Hemoglobin 03/17/2017 9.5* 13.0 - 17.1 g/dL Final  . HCT 03/17/2017 31.1* 38.4 - 49.9 % Final  . MCV 03/17/2017 94.2  79.3 - 98.0 fL Final  . MCH 03/17/2017 28.8  27.2 - 33.4 pg Final  . MCHC 03/17/2017 30.5* 32.0 - 36.0 g/dL Final  . RDW 03/17/2017 15.5* 11.0 - 14.6 % Final  . Platelet Count 03/17/2017 139* 140 - 400 K/uL Final  . Neutrophils Relative % 03/17/2017 67  % Final  . Neutro Abs 03/17/2017 2.8  1.5 - 6.5 K/uL Final  . Lymphocytes Relative 03/17/2017 15  % Final  . Lymphs Abs 03/17/2017 0.6* 0.9 - 3.3 K/uL Final  . Monocytes Relative 03/17/2017 15  % Final  . Monocytes Absolute 03/17/2017 0.6  0.1 - 0.9 K/uL Final  . Eosinophils Relative 03/17/2017 3  % Final  . Eosinophils Absolute 03/17/2017 0.1  0.0 - 0.5 K/uL Final  . Basophils Relative 03/17/2017 0  % Final  . Basophils Absolute  03/17/2017 0.0  0.0 - 0.1 K/uL Final   Performed at [facility], [place] [address], [place], [place] [address]  . Sodium 03/17/2017 142  136 - 145 mmol/L Final  . Potassium 03/17/2017 3.9  3.5 - 5.1 mmol/L Final  . Chloride 03/17/2017 103  98 - 109 mmol/L Final  . CO2 03/17/2017 28  22 - 29 mmol/L Final  . Glucose, Bld 03/17/2017 140  70 - 140 mg/dL Final  . BUN 03/17/2017 17  7 - 26 mg/dL Final  . Creatinine 03/17/2017 1.38* 0.70 - 1.30 mg/dL Final  . Calcium 03/17/2017 9.4  8.4 - 10.4 mg/dL Final  . Total Protein 03/17/2017 7.2  6.4 - 8.3 g/dL Final  . Albumin 03/17/2017 3.9  3.5 - 5.0 g/dL Final  . AST 03/17/2017 15  5 - 34 U/L Final  . ALT 03/17/2017 12  0 - 55 U/L Final  . Alkaline Phosphatase 03/17/2017 71  40 - 150 U/L  Final  . Total Bilirubin 03/17/2017 0.4  0.2 - 1.2 mg/dL Final  . GFR, Est Non Af Am 03/17/2017 46* >60 mL/min Final  . GFR, Est AFR Am 03/17/2017 53* >60 mL/min Final   Comment: (NOTE) The eGFR has been calculated using the CKD EPI equation. This calculation has not been validated in all clinical situations. eGFR's persistently <60 mL/min signify possible Chronic Kidney Disease.   Georgiann Hahn gap 03/17/2017 11  3 - 11 Final   Performed at [facility], [place] [address] [place], [place] [address]  . Iron 03/17/2017 118  42 - 163 ug/dL Final  . TIBC 03/17/2017 354  202 - 409 ug/dL Final  . Saturation Ratios 03/17/2017 33* 42 - 163 % Final  . UIBC 03/17/2017 235  ug/dL Final   Performed at [facility], [place] [name] [place], [place] [address]  . Ferritin 03/17/2017 21* 22 - 316 ng/mL Final   Performed at [facility], [place] [name] [place], [place] [address]  . Blood Bank Specimen 03/17/2017 SAMPLE AVAILABLE FOR TESTING   Final  . Sample Expiration 03/17/2017    Final                   Value:03/20/2017 Performed at [facility], [place] [address] [place], [place] [address]          Ardath Sax, MD

## 2017-04-06 NOTE — Assessment & Plan Note (Signed)
84 y.o. male with endoscopy-confirmed presence of small bowel arteriovenous malformations with likely chronic gastrointestinal blood loss resulting in anemia of iron deficiency with severe to moderate hemoglobin decline.  Patient will likely need chronic iron and packed red blood cell replacement.  Plan: - Labs today to assess hemoglobin/iron status. -Transfusion parameters:   --Transfuse 1Unit of pRBC for Hgb <= 8.5g/dL  --Transfuse 2Unts of pRBC for Hgb <=7.0g/dL --Weekly labs with CBC/d if hemoglobin is above transfusion threshold, administer parenteral iron to keep ferritin over 50 -Return to clinic in 1 month with labs 3 days prior.

## 2017-04-07 ENCOUNTER — Other Ambulatory Visit: Payer: PPO

## 2017-04-07 ENCOUNTER — Other Ambulatory Visit: Payer: Self-pay | Admitting: *Deleted

## 2017-04-11 DIAGNOSIS — R52 Pain, unspecified: Secondary | ICD-10-CM | POA: Diagnosis not present

## 2017-04-11 DIAGNOSIS — D500 Iron deficiency anemia secondary to blood loss (chronic): Secondary | ICD-10-CM | POA: Diagnosis not present

## 2017-04-11 DIAGNOSIS — N189 Chronic kidney disease, unspecified: Secondary | ICD-10-CM | POA: Diagnosis not present

## 2017-04-11 DIAGNOSIS — M1000 Idiopathic gout, unspecified site: Secondary | ICD-10-CM | POA: Diagnosis not present

## 2017-04-11 DIAGNOSIS — I480 Paroxysmal atrial fibrillation: Secondary | ICD-10-CM | POA: Diagnosis not present

## 2017-04-11 DIAGNOSIS — M6281 Muscle weakness (generalized): Secondary | ICD-10-CM | POA: Diagnosis not present

## 2017-04-11 DIAGNOSIS — R05 Cough: Secondary | ICD-10-CM | POA: Diagnosis not present

## 2017-04-11 DIAGNOSIS — J449 Chronic obstructive pulmonary disease, unspecified: Secondary | ICD-10-CM | POA: Diagnosis not present

## 2017-04-11 DIAGNOSIS — I10 Essential (primary) hypertension: Secondary | ICD-10-CM | POA: Diagnosis not present

## 2017-04-11 DIAGNOSIS — G4733 Obstructive sleep apnea (adult) (pediatric): Secondary | ICD-10-CM | POA: Diagnosis not present

## 2017-04-11 DIAGNOSIS — E119 Type 2 diabetes mellitus without complications: Secondary | ICD-10-CM | POA: Diagnosis not present

## 2017-04-11 DIAGNOSIS — S72001D Fracture of unspecified part of neck of right femur, subsequent encounter for closed fracture with routine healing: Secondary | ICD-10-CM | POA: Diagnosis not present

## 2017-04-12 ENCOUNTER — Other Ambulatory Visit: Payer: Self-pay | Admitting: *Deleted

## 2017-04-12 NOTE — Patient Outreach (Signed)
[facility]) Care Management  04/12/2017  Joel [name] 04-04-1932 [id number]   Phone call to [place] at [facility] SN[facility] to follow up on patient's rehab status. This Education officer, museum currently covering for [employer], LCSW in her absence. Voicemail message left for a return call.  This Education officer, museum will inform assigned Education officer, museum for follow up.   Sheralyn Boatman [facility] Care Management [phone]

## 2017-04-13 ENCOUNTER — Other Ambulatory Visit: Payer: Self-pay | Admitting: Licensed Clinical Social Worker

## 2017-04-13 ENCOUNTER — Ambulatory Visit (HOSPITAL_COMMUNITY): Payer: PPO | Admitting: Licensed Clinical Social Worker

## 2017-04-13 NOTE — Patient Outreach (Signed)
[facility]) Care Management  04/13/2017  Joel [name] 1932-10-08 [id number]  Assessment-CSW completed outreach attempt today to [facility] SN[facility]. CSW unable to reach anyone and left a message with front desk receptionist to return call once available.   Plan-CSW will await return call or complete an additional outreach within one week.   Joel [name], BSW, MSW, [name] Phone: [phone] Fax: [phone]

## 2017-04-14 ENCOUNTER — Other Ambulatory Visit: Payer: PPO

## 2017-04-14 ENCOUNTER — Ambulatory Visit: Payer: PPO | Admitting: Hematology and Oncology

## 2017-04-19 ENCOUNTER — Ambulatory Visit: Payer: Self-pay | Admitting: Licensed Clinical Social Worker

## 2017-04-20 ENCOUNTER — Other Ambulatory Visit: Payer: Self-pay | Admitting: Licensed Clinical Social Worker

## 2017-04-20 DIAGNOSIS — N189 Chronic kidney disease, unspecified: Secondary | ICD-10-CM | POA: Diagnosis not present

## 2017-04-20 DIAGNOSIS — J449 Chronic obstructive pulmonary disease, unspecified: Secondary | ICD-10-CM | POA: Diagnosis not present

## 2017-04-20 DIAGNOSIS — M6281 Muscle weakness (generalized): Secondary | ICD-10-CM | POA: Diagnosis not present

## 2017-04-20 DIAGNOSIS — I10 Essential (primary) hypertension: Secondary | ICD-10-CM | POA: Diagnosis not present

## 2017-04-20 DIAGNOSIS — E119 Type 2 diabetes mellitus without complications: Secondary | ICD-10-CM | POA: Diagnosis not present

## 2017-04-20 DIAGNOSIS — R05 Cough: Secondary | ICD-10-CM | POA: Diagnosis not present

## 2017-04-20 DIAGNOSIS — S72001D Fracture of unspecified part of neck of right femur, subsequent encounter for closed fracture with routine healing: Secondary | ICD-10-CM | POA: Diagnosis not present

## 2017-04-20 DIAGNOSIS — R52 Pain, unspecified: Secondary | ICD-10-CM | POA: Diagnosis not present

## 2017-04-20 DIAGNOSIS — I480 Paroxysmal atrial fibrillation: Secondary | ICD-10-CM | POA: Diagnosis not present

## 2017-04-20 DIAGNOSIS — G4733 Obstructive sleep apnea (adult) (pediatric): Secondary | ICD-10-CM | POA: Diagnosis not present

## 2017-04-20 DIAGNOSIS — M1000 Idiopathic gout, unspecified site: Secondary | ICD-10-CM | POA: Diagnosis not present

## 2017-04-20 DIAGNOSIS — D500 Iron deficiency anemia secondary to blood loss (chronic): Secondary | ICD-10-CM | POA: Diagnosis not present

## 2017-04-20 NOTE — Addendum Note (Signed)
Addended by: Greg Cutter on: 04/20/2017 02:55 PM   Modules accepted: Orders

## 2017-04-20 NOTE — Patient Outreach (Signed)
[place]) Care Management  [facility] Social Work  04/20/2017  Joel [name] 10-Sep-1932 [id number]  Encounter Medications:  Outpatient Encounter Medications as of 04/20/2017  Medication Sig  . allopurinol (ZYLOPRIM) 100 MG tablet Take 100 mg by mouth daily.   . Ascorbic Acid (VITAMIN C PO) Take 1 tablet by mouth daily.  Marland Kitchen buPROPion (WELLBUTRIN XL) 150 MG 24 hr tablet Take 1 tablet (150 mg total) by mouth daily.  . cholecalciferol (VITAMIN D) 1000 units tablet Take 1,000 Units by mouth daily.  . citalopram (CELEXA) 40 MG tablet Take 40 mg by mouth daily.   . Cyanocobalamin (VITAMIN B-12 PO) Take 1 tablet by mouth daily.  Marland Kitchen dextromethorphan-guaiFENesin (MUCINEX DM) 30-600 MG 12hr tablet Take 1 tablet by mouth 2 (two) times daily.  Marland Kitchen diltiazem (CARDIZEM SR) 120 MG 12 hr capsule Take 120 mg by mouth daily.   Marland Kitchen enoxaparin (LOVENOX) 30 MG/0.3ML injection Inject 0.3 mLs (30 mg total) into the skin daily.  . fluticasone (FLONASE) 50 MCG/ACT nasal spray Place 2 sprays into both nostrils daily. (Patient taking differently: Place 2 sprays into both nostrils daily as needed for allergies or rhinitis. )  . furosemide (LASIX) 40 MG tablet Take 1 tablet (40 mg total) by mouth daily.  Marland Kitchen glimepiride (AMARYL) 1 MG tablet Take 1 mg by mouth daily with breakfast.   . HYDROcodone-acetaminophen (NORCO/VICODIN) 5-325 MG tablet Take 1-2 tablets by mouth every 6 (six) hours as needed for moderate pain.  Marland Kitchen ipratropium (ATROVENT) 0.06 % nasal spray instill 2 sprays into each nostril four times a day as needed for allergies  . iron polysaccharides (NIFEREX) 150 MG capsule Take 1 capsule (150 mg total) by mouth 2 (two) times daily.  Marland Kitchen losartan (COZAAR) 25 MG tablet Take 1 tablet (25 mg total) by mouth daily.  Marland Kitchen lovastatin (MEVACOR) 20 MG tablet Take 20 mg by mouth at bedtime.   . meclizine (ANTIVERT) 25 MG tablet Take 1 tablet (25 mg total) by mouth 3 (three) times daily as needed for dizziness.  (Patient taking differently: Take 25 mg by mouth 2 (two) times daily. )  . metFORMIN (GLUCOPHAGE) 1000 MG tablet Take 1,000 mg by mouth 2 (two) times daily.   . Multiple Vitamins-Minerals (VISION FORMULA PO) Take 1 tablet by mouth 2 (two) times daily.  Marland Kitchen omeprazole (PRILOSEC) 20 MG capsule Take 1 capsule (20 mg total) by mouth 2 (two) times daily.  . OXYGEN Inhale 2 L into the lungs as needed (shortness of breath).   . potassium chloride SA (K-DUR,KLOR-CON) 20 MEQ tablet Take 20 mEq by mouth daily.   Marland Kitchen senna-docusate (SENOKOT-S) 8.6-50 MG tablet Take 2 tablets by mouth 2 (two) times daily.  . temazepam (RESTORIL) 15 MG capsule Take 1 capsule (15 mg total) by mouth at bedtime.   No facility-administered encounter medications on file as of 04/20/2017.     Functional Status:  In your present state of health, do you have any difficulty performing the following activities: 03/29/2017 03/23/2017  Hearing? N N  Comment - -  Vision? N N  Difficulty concentrating or making decisions? N N  Walking or climbing stairs? Y N  Dressing or bathing? Y Y  Doing errands, shopping? N N  Some recent data might be hidden    Fall/Depression Screening:  PHQ 2/9 Scores 03/29/2017 07/14/2016 03/30/2016 01/07/2016 01/07/2016  PHQ - 2 Score 0 0 0 0 0    Assessment: CSW arrived at [facility] and went to SNF social worker's  office to discuss case and expected discharged. CSW was informed that patient will discharge back home tomorrow on 04/21/17 with [facility] (PT, OT and Aide to assist with bathing) and that a wheelchair and 3 in 1 beside commode would be ordered for him as well. CSW visited patient in his room at [facility]. Patient shares that he is very excited to go home and see his dog who he is very close to. He wanted to see an image of what a 3 in 1 bedside commode looked like and CSW showed him an image on Google. Patient shares that he has no concerns about returning home tomorrow. He shares that his  daughter will provide stable transportation for him to go to medical appointments post SNF discharge. Patient confirms to have a very strong support of family and friends. He denies needing personal care service resources at this time even though patient still cannot bear weight. Patient agreeable to [place] involvement post SNF discharge. Patient denies any pharmacy or social work needs.   [facility] CM Care Plan Problem One     Most Recent Value  Care Plan Problem One  SNF admission due to fall in the home  Role Documenting the Problem One  Clinical Social Worker  Care Plan for Problem One  Active  [facility] Long Term Goal   Patient will have a safe and stable discharge back home from SNF within 90 days per patient report (due to fall and injury)  [facility] Long Term Goal Start Date  03/29/17  Interventions for Problem One Long Term Goal  CSW arrived at [facility] and was informed that patient will discharge back home from SNF tomorrow on 04/21/17. CSW will re-refer back to [facility] telephonic. Patient feels comfortable with discharge plan and [facility] services that will be ordered through [facility] as well as the medical equipment (wheelchair and 3-in-1 commode) Patient appreciative of social work assistance.     Plan: CSW will close case at this time and refer back to [place] services. CSW will update PCP as well.  [name], BSW, MSW, [email or web address] Phone: [phone] Fax: [phone]

## 2017-04-21 ENCOUNTER — Other Ambulatory Visit: Payer: PPO

## 2017-04-22 ENCOUNTER — Other Ambulatory Visit: Payer: Self-pay

## 2017-04-22 NOTE — Patient Outreach (Signed)
[facility]) Care Management  04/22/2017  PETE SCHNITZER 09-22-1932 [id number]   Referral Date: 04/20/17 Referral Source:  [place] Work Date of Admission: ? Diagnosis: Right hip fracture Date of Discharge: ?-04/21/17 Facility: [facility] Insurance: HTA  Outreach attempt # 1 to patient for transition of care follow up.  No answer.  HIPAA compliant voice message left.    Plan: RN CM will send letter and attempt patient again within 4 business days.     [name], RN, MSN [facility] Care Management Care Management Coordinator Direct Line [phone] Toll Free: [phone]  Fax: [phone]

## 2017-04-25 DIAGNOSIS — F329 Major depressive disorder, single episode, unspecified: Secondary | ICD-10-CM | POA: Diagnosis not present

## 2017-04-25 DIAGNOSIS — M1000 Idiopathic gout, unspecified site: Secondary | ICD-10-CM | POA: Diagnosis not present

## 2017-04-25 DIAGNOSIS — F319 Bipolar disorder, unspecified: Secondary | ICD-10-CM | POA: Diagnosis not present

## 2017-04-25 DIAGNOSIS — N189 Chronic kidney disease, unspecified: Secondary | ICD-10-CM | POA: Diagnosis not present

## 2017-04-25 DIAGNOSIS — S72011D Unspecified intracapsular fracture of right femur, subsequent encounter for closed fracture with routine healing: Secondary | ICD-10-CM | POA: Diagnosis not present

## 2017-04-25 DIAGNOSIS — E1140 Type 2 diabetes mellitus with diabetic neuropathy, unspecified: Secondary | ICD-10-CM | POA: Diagnosis not present

## 2017-04-25 DIAGNOSIS — E1122 Type 2 diabetes mellitus with diabetic chronic kidney disease: Secondary | ICD-10-CM | POA: Diagnosis not present

## 2017-04-25 DIAGNOSIS — J449 Chronic obstructive pulmonary disease, unspecified: Secondary | ICD-10-CM | POA: Diagnosis not present

## 2017-04-25 DIAGNOSIS — I129 Hypertensive chronic kidney disease with stage 1 through stage 4 chronic kidney disease, or unspecified chronic kidney disease: Secondary | ICD-10-CM | POA: Diagnosis not present

## 2017-04-25 DIAGNOSIS — J309 Allergic rhinitis, unspecified: Secondary | ICD-10-CM | POA: Diagnosis not present

## 2017-04-25 DIAGNOSIS — E559 Vitamin D deficiency, unspecified: Secondary | ICD-10-CM | POA: Diagnosis not present

## 2017-04-25 DIAGNOSIS — D509 Iron deficiency anemia, unspecified: Secondary | ICD-10-CM | POA: Diagnosis not present

## 2017-04-25 DIAGNOSIS — I482 Chronic atrial fibrillation: Secondary | ICD-10-CM | POA: Diagnosis not present

## 2017-04-26 ENCOUNTER — Other Ambulatory Visit: Payer: Self-pay

## 2017-04-26 ENCOUNTER — Telehealth (INDEPENDENT_AMBULATORY_CARE_PROVIDER_SITE_OTHER): Payer: Self-pay | Admitting: Orthopaedic Surgery

## 2017-04-26 NOTE — Telephone Encounter (Signed)
Ok for order?  

## 2017-04-26 NOTE — Telephone Encounter (Signed)
I called [facility] and advised.

## 2017-04-26 NOTE — Telephone Encounter (Signed)
[facility]on, PT (Not sure of the spelling) from [facility]  left a vm message requesting VO for PT for the following:  3x a week for 2 weeks 2x a week for 3 weeks  CB#[phone].  Thank you.

## 2017-04-26 NOTE — Telephone Encounter (Signed)
Ok . REMIND THEM HE IS NWB ON HIS HIP UNTIL HE HEALS. THANKS

## 2017-04-26 NOTE — Patient Outreach (Signed)
[name]) Care Management  04/26/2017  GHAZI RUMPF November 25, 1932 [id number]   Referral Date: 04/20/17 Referral Source:  [place] [facility] Date of Admission: 03/27/17 Diagnosis: Right hip fracture Date of Discharge: 04/21/17 Facility: [facility] Insurance: HTA  Outreach attempt # 2 Spoke with patient. He is able to verify HIPAA.  Patient reports that he is doing well.  Patient reports he has plenty of support.  He states his daughter lives nearby and has been checking on him frequently.  He states he has friends who come over and bring meals and walk his dog.  Patient reports he is non-weight bearing on right leg but has learned to adjust. He utilizes this wheelchair to get around.  Patient has home health and most recent visit was yesterday per patient. He states they will be coming 3 times a week for now.   Social: Patient lives alone but has people in and out all day.  He states he is considering getting an apartment at [facility] and will be meeting with them on Friday with his daughter.    Conditions: Patient had recent right hip fracture from a fall.  [facility] to [facility] for rehab. Patient admits to diabetes, HTN, and COPD.  Patient denies any problems with other medical problems.      Medications: Patient reports that he is taking his medication as ordered.  Patient declined to review medication on call.  Appointments: Patient has appointment with Dr. Inda Merlin on April 3rd and has transportation.    Consent: RN CM reviewed [facility] services with patient. Patient declined services at this time.  Plan: RN CM will close case at this time.     [name], RN, MSN [facility] Care Management Care Management Coordinator Direct Line [phone] Toll Free: [phone]  Fax: [phone]

## 2017-05-03 DIAGNOSIS — J309 Allergic rhinitis, unspecified: Secondary | ICD-10-CM | POA: Diagnosis not present

## 2017-05-03 DIAGNOSIS — F329 Major depressive disorder, single episode, unspecified: Secondary | ICD-10-CM | POA: Diagnosis not present

## 2017-05-03 DIAGNOSIS — D509 Iron deficiency anemia, unspecified: Secondary | ICD-10-CM | POA: Diagnosis not present

## 2017-05-03 DIAGNOSIS — M1000 Idiopathic gout, unspecified site: Secondary | ICD-10-CM | POA: Diagnosis not present

## 2017-05-03 DIAGNOSIS — J449 Chronic obstructive pulmonary disease, unspecified: Secondary | ICD-10-CM | POA: Diagnosis not present

## 2017-05-03 DIAGNOSIS — F319 Bipolar disorder, unspecified: Secondary | ICD-10-CM | POA: Diagnosis not present

## 2017-05-03 DIAGNOSIS — E1122 Type 2 diabetes mellitus with diabetic chronic kidney disease: Secondary | ICD-10-CM | POA: Diagnosis not present

## 2017-05-03 DIAGNOSIS — E559 Vitamin D deficiency, unspecified: Secondary | ICD-10-CM | POA: Diagnosis not present

## 2017-05-03 DIAGNOSIS — S72011D Unspecified intracapsular fracture of right femur, subsequent encounter for closed fracture with routine healing: Secondary | ICD-10-CM | POA: Diagnosis not present

## 2017-05-03 DIAGNOSIS — I129 Hypertensive chronic kidney disease with stage 1 through stage 4 chronic kidney disease, or unspecified chronic kidney disease: Secondary | ICD-10-CM | POA: Diagnosis not present

## 2017-05-03 DIAGNOSIS — E1140 Type 2 diabetes mellitus with diabetic neuropathy, unspecified: Secondary | ICD-10-CM | POA: Diagnosis not present

## 2017-05-03 DIAGNOSIS — I482 Chronic atrial fibrillation: Secondary | ICD-10-CM | POA: Diagnosis not present

## 2017-05-03 DIAGNOSIS — N189 Chronic kidney disease, unspecified: Secondary | ICD-10-CM | POA: Diagnosis not present

## 2017-05-04 ENCOUNTER — Ambulatory Visit (INDEPENDENT_AMBULATORY_CARE_PROVIDER_SITE_OTHER): Payer: PPO | Admitting: Orthopaedic Surgery

## 2017-05-06 ENCOUNTER — Ambulatory Visit (INDEPENDENT_AMBULATORY_CARE_PROVIDER_SITE_OTHER): Payer: PPO

## 2017-05-06 ENCOUNTER — Encounter (INDEPENDENT_AMBULATORY_CARE_PROVIDER_SITE_OTHER): Payer: Self-pay | Admitting: Orthopaedic Surgery

## 2017-05-06 ENCOUNTER — Ambulatory Visit (INDEPENDENT_AMBULATORY_CARE_PROVIDER_SITE_OTHER): Payer: PPO | Admitting: Orthopaedic Surgery

## 2017-05-06 VITALS — BP 128/71 | HR 78 | Ht 72.0 in | Wt 216.0 lb

## 2017-05-06 DIAGNOSIS — M25551 Pain in right hip: Secondary | ICD-10-CM

## 2017-05-06 DIAGNOSIS — S72001A Fracture of unspecified part of neck of right femur, initial encounter for closed fracture: Secondary | ICD-10-CM

## 2017-05-06 NOTE — Progress Notes (Signed)
Post-Op Visit Note   Patient: Joel [name]           Date of Birth: November 22, 1932           MRN: [id number] Visit Date: 05/06/2017 PCP: Wenda Low, MD   Assessment & Plan: Post cannulated screw fixation for right femoral neck fracture now 6-1/2 weeks out from surgery done on 03/23/2017.  Position is satisfactory on x-rays today.  He can begin 50% weightbearing and I will recheck him in 1 month.  Chief Complaint:  Chief Complaint  Patient presents with  . Right Hip - Routine Post Op    03/23/17 cannulated hip pinning    Visit Diagnoses:  1. Pain in right hip   2. Closed displaced fracture of right femoral neck ([place])   2.  Hx of bilat knee OA   Plan: Patient can begin 50% weightbearing on his right hip with therapy.  I will recheck him in 1 month repeat AP frog-leg x-rays of his right hip and will obtain bilateral knee x-rays standing to see both knees and a lateral of the right knee and lateral of the left knee.  Follow-Up Instructions: Return in about 1 month (around 06/05/2017).   Orders:  Orders Placed This Encounter  Procedures  . XR HIP UNILAT W OR W/O PELVIS 2-3 VIEWS RIGHT   No orders of the defined types were placed in this encounter.   Imaging: Xr Hip Unilat W Or W/o Pelvis 2-3 Views Right  Result Date: 05/06/2017 AP and frog-leg x-ray right hip obtained post 3 cannulated screw fixation for femoral neck fracture.  There is 1-2 mm of medial displacement of the head on the neck in comparison to 04/06/2017 images.  No evidence of AVN.  No change in the screw position. Impression: Post cannulated screw fixation femoral neck with 2 mm medial displacement without varus.  No change in screw position from last month images.   PMFS History: Patient Active Problem List   Diagnosis Date Noted  . Closed displaced fracture of right femoral neck ([place]) 03/22/2017  . Cough 01/17/2017  . Persistent atrial fibrillation ([place]) 01/17/2017  . Leg swelling 09/26/2016  . GI bleed  05/15/2016  . UGI bleed 05/14/2016  . COPD (chronic obstructive pulmonary disease) ([place]) 05/14/2016  . Chronic GI bleeding 04/12/2016  . HLD (hyperlipidemia) 04/12/2016  . Anemia due to chronic blood loss 04/12/2016  . Diabetes mellitus with complication ([place])   . BPPV (benign paroxysmal positional vertigo) 01/27/2016  . Hypertension 01/27/2016  . Malignant neoplasm of prostate ([place]) 01/07/2016  . Paroxysmal atrial fibrillation (HCC)   . Bipolar I disorder ([place])   . Bright red rectal bleeding 12/16/2015  . Rectal bleeding 12/16/2015  . Pulmonary hypertension ([place]) 06/29/2015  . Moderate aortic stenosis 04/17/2013  . Encounter for monitoring Coumadin therapy 04/17/2013  . Anemia, iron deficiency 04/04/2013  . Dyspnea on exertion 11/18/2012  . Obesity hypoventilation syndrome ([place]) 09/19/2012  . ALLERGIC RHINITIS 06/24/2009  . DM2 (diabetes mellitus, type 2) ([place]) 03/13/2007  . OSA (obstructive sleep apnea) 03/13/2007  . COPD mixed type ([place]) 03/13/2007   Past Medical History:  Diagnosis Date  . Allergic rhinitis   . Anemia   . Anxiety   . Aortic stenosis 2012   mild to mod   . Atrial fibrillation ([place]) 1991   with multiple DCCV  . Bipolar affective disorder ([place])   . COPD (chronic obstructive pulmonary disease) ([place])   . Diabetic neuropathy ([place])    in feet  .  Diabetic neuropathy ([place])   . Dyslipidemia   . Fatigue    last year or so  . Gout   . Hypertension   . Insomnia   . Obesity   . On home oxygen therapy 2 1/2 liters with bipap at night  . OSA (obstructive sleep apnea)    severe, uses bipap 16 1/2 by 12 or 13 setting  . Prostate cancer ([place])   . Rectal bleeding 12/16/2015  . Type 2 diabetes mellitus ([place])   . Vertigo     Family History  Problem Relation Age of Onset  . Tuberculosis Maternal Grandmother   . Heart attack Neg Hx   . Diabetes Neg Hx   . CAD Neg Hx   . Cancer Neg Hx     Past Surgical History:  Procedure Laterality Date  . CARDIOVERSION   yrs ago   prior to 1998  . COLONOSCOPY WITH PROPOFOL N/A 01/23/2013   Procedure: COLONOSCOPY WITH PROPOFOL;  Surgeon: Garlan Fair, MD;  Location: [facility];  Service: Endoscopy;  Laterality: N/A;  . electro shock  1969   for depression  . ENTEROSCOPY Left 05/17/2016   Procedure: ENTEROSCOPY;  Surgeon: Wilford Corner, MD;  Location: [facility];  Service: Endoscopy;  Laterality: Left;  . ESOPHAGOGASTRODUODENOSCOPY N/A 03/03/2016   Procedure: ESOPHAGOGASTRODUODENOSCOPY (EGD);  Surgeon: Teena Irani, MD;  [facility];  Service: Endoscopy;  Laterality: N/A;  . ESOPHAGOGASTRODUODENOSCOPY N/A 03/26/2016   Procedure: ESOPHAGOGASTRODUODENOSCOPY (EGD);  Surgeon: Teena Irani, MD;  Location[facility];  Service: Endoscopy;  Laterality: N/A;  would like to use ultraslim scope  . ESOPHAGOGASTRODUODENOSCOPY (EGD) WITH PROPOFOL N/A 01/23/2013   Procedure: ESOPHAGOGASTRODUODENOSCOPY (EGD) WITH PROPOFOL;  Surgeon: Garlan Fair, MD;  [facility];  Service: Endoscopy;  Laterality: N/A;  . ESOPHAGOGASTRODUODENOSCOPY (EGD) WITH PROPOFOL N/A 04/13/2016   Procedure: ESOPHAGOGASTRODUODENOSCOPY (EGD) WITH PROPOFOL;  Surgeon: Otis Brace, MD;  Location: [place];  Service: Gastroenterology;  Laterality: N/A;  . GIVENS CAPSULE STUDY N/A 03/03/2016   Procedure: GIVENS CAPSULE STUDY;  Surgeon: Teena Irani, MD;  [facility];  Service: Endoscopy;  Laterality: N/A;  . HEMORRHOID SURGERY N/A 12/16/2015   Procedure: PROCTOSCOPY WITH CONTROL OF BLEEDING;  Surgeon: Fanny Skates, MD;  Location: [place];  Service: General;  Laterality: N/A;  . HIP PINNING,CANNULATED Right 03/23/2017   Procedure: CANNULATED HIP PINNING;  Surgeon: Marybelle Killings, MD;  Location: [facility];  Service: Orthopedics;  Laterality: Right;  . KNEE SURGERY  1970's   rt  . MAZE  1998  . PROSTATE BIOPSY    . SHOULDER SURGERY  7-8 yrs ago   rt   Social History   Occupational History  . Occupation: Surveyor    Tobacco Use  . Smoking status: Former Smoker    Packs/day: 2.00    Years: 25.00    Pack years: 50.00    Types: Cigarettes    Last attempt to quit: 07/08/1976    Years since quitting: 40.8  . Smokeless tobacco: Never Used  Substance and Sexual Activity  . Alcohol use: No    Comment: in AA since 1977, no alcohol since 1977  . Drug use: No    Types: Marijuana    Comment: marijuana use 37 years ago  . Sexual activity: Yes

## 2017-05-09 ENCOUNTER — Telehealth (INDEPENDENT_AMBULATORY_CARE_PROVIDER_SITE_OTHER): Payer: Self-pay | Admitting: Orthopaedic Surgery

## 2017-05-09 NOTE — Telephone Encounter (Signed)
Yes Ok thanks. I gave him a Rx on his visit also. thanks

## 2017-05-09 NOTE — Telephone Encounter (Signed)
[facility] for verbal orders to continue PT? Per last office note, may begin weight bearing up to 50%.

## 2017-05-09 NOTE — Telephone Encounter (Signed)
[place]  [phone]  Verbal Order  Weight bearing status

## 2017-05-10 NOTE — Telephone Encounter (Signed)
I left voicemail for Joel [name] advising.

## 2017-05-11 DIAGNOSIS — M6281 Muscle weakness (generalized): Secondary | ICD-10-CM | POA: Diagnosis not present

## 2017-05-11 DIAGNOSIS — C189 Malignant neoplasm of colon, unspecified: Secondary | ICD-10-CM | POA: Diagnosis not present

## 2017-05-11 DIAGNOSIS — G4733 Obstructive sleep apnea (adult) (pediatric): Secondary | ICD-10-CM | POA: Diagnosis not present

## 2017-05-11 DIAGNOSIS — Z9181 History of falling: Secondary | ICD-10-CM | POA: Diagnosis not present

## 2017-05-11 DIAGNOSIS — J410 Simple chronic bronchitis: Secondary | ICD-10-CM | POA: Diagnosis not present

## 2017-05-11 DIAGNOSIS — S72011D Unspecified intracapsular fracture of right femur, subsequent encounter for closed fracture with routine healing: Secondary | ICD-10-CM | POA: Diagnosis not present

## 2017-05-11 DIAGNOSIS — J449 Chronic obstructive pulmonary disease, unspecified: Secondary | ICD-10-CM | POA: Diagnosis not present

## 2017-05-11 DIAGNOSIS — E662 Morbid (severe) obesity with alveolar hypoventilation: Secondary | ICD-10-CM | POA: Diagnosis not present

## 2017-05-20 DIAGNOSIS — E1140 Type 2 diabetes mellitus with diabetic neuropathy, unspecified: Secondary | ICD-10-CM | POA: Diagnosis not present

## 2017-05-20 DIAGNOSIS — F319 Bipolar disorder, unspecified: Secondary | ICD-10-CM | POA: Diagnosis not present

## 2017-05-20 DIAGNOSIS — D509 Iron deficiency anemia, unspecified: Secondary | ICD-10-CM | POA: Diagnosis not present

## 2017-05-20 DIAGNOSIS — S72011D Unspecified intracapsular fracture of right femur, subsequent encounter for closed fracture with routine healing: Secondary | ICD-10-CM | POA: Diagnosis not present

## 2017-05-20 DIAGNOSIS — M1000 Idiopathic gout, unspecified site: Secondary | ICD-10-CM | POA: Diagnosis not present

## 2017-05-20 DIAGNOSIS — I482 Chronic atrial fibrillation: Secondary | ICD-10-CM | POA: Diagnosis not present

## 2017-05-20 DIAGNOSIS — E1122 Type 2 diabetes mellitus with diabetic chronic kidney disease: Secondary | ICD-10-CM | POA: Diagnosis not present

## 2017-05-20 DIAGNOSIS — J449 Chronic obstructive pulmonary disease, unspecified: Secondary | ICD-10-CM | POA: Diagnosis not present

## 2017-05-20 DIAGNOSIS — N189 Chronic kidney disease, unspecified: Secondary | ICD-10-CM | POA: Diagnosis not present

## 2017-05-20 DIAGNOSIS — J309 Allergic rhinitis, unspecified: Secondary | ICD-10-CM | POA: Diagnosis not present

## 2017-05-20 DIAGNOSIS — I129 Hypertensive chronic kidney disease with stage 1 through stage 4 chronic kidney disease, or unspecified chronic kidney disease: Secondary | ICD-10-CM | POA: Diagnosis not present

## 2017-05-20 DIAGNOSIS — F329 Major depressive disorder, single episode, unspecified: Secondary | ICD-10-CM | POA: Diagnosis not present

## 2017-05-20 DIAGNOSIS — E559 Vitamin D deficiency, unspecified: Secondary | ICD-10-CM | POA: Diagnosis not present

## 2017-05-30 DIAGNOSIS — I482 Chronic atrial fibrillation: Secondary | ICD-10-CM | POA: Diagnosis not present

## 2017-05-30 DIAGNOSIS — C61 Malignant neoplasm of prostate: Secondary | ICD-10-CM | POA: Diagnosis not present

## 2017-05-30 DIAGNOSIS — I10 Essential (primary) hypertension: Secondary | ICD-10-CM | POA: Diagnosis not present

## 2017-05-30 DIAGNOSIS — N183 Chronic kidney disease, stage 3 (moderate): Secondary | ICD-10-CM | POA: Diagnosis not present

## 2017-05-30 DIAGNOSIS — I509 Heart failure, unspecified: Secondary | ICD-10-CM | POA: Diagnosis not present

## 2017-05-30 DIAGNOSIS — E1165 Type 2 diabetes mellitus with hyperglycemia: Secondary | ICD-10-CM | POA: Diagnosis not present

## 2017-05-30 DIAGNOSIS — J449 Chronic obstructive pulmonary disease, unspecified: Secondary | ICD-10-CM | POA: Diagnosis not present

## 2017-05-30 DIAGNOSIS — E782 Mixed hyperlipidemia: Secondary | ICD-10-CM | POA: Diagnosis not present

## 2017-05-31 ENCOUNTER — Telehealth (INDEPENDENT_AMBULATORY_CARE_PROVIDER_SITE_OTHER): Payer: Self-pay

## 2017-05-31 NOTE — Telephone Encounter (Signed)
Ok for order?  

## 2017-05-31 NOTE — Telephone Encounter (Signed)
I called Joel [name] and advised.

## 2017-05-31 NOTE — Telephone Encounter (Signed)
Tillie Rung with  [facility] would like to request an extension on HHPT for 1 x week for 2 weeks for patient.  CB# is [phone].  Please advise.  Thank you.

## 2017-05-31 NOTE — Telephone Encounter (Signed)
OK, thank you.

## 2017-06-03 ENCOUNTER — Telehealth (INDEPENDENT_AMBULATORY_CARE_PROVIDER_SITE_OTHER): Payer: Self-pay | Admitting: Orthopaedic Surgery

## 2017-06-03 NOTE — Telephone Encounter (Signed)
[place]  [phone]   Please call to discuss pt care  Frequency orders   DME equipment questions

## 2017-06-03 NOTE — Telephone Encounter (Signed)
I spoke with Tanika. She states that they are going to close out nursing orders.  They will continue to go out for PT as ordered. Patient has follow up appt on 06/07/17. If it is felt that he needs nursing at that time, we just need to send over an order and they will resume care.

## 2017-06-07 ENCOUNTER — Ambulatory Visit (INDEPENDENT_AMBULATORY_CARE_PROVIDER_SITE_OTHER): Payer: PPO

## 2017-06-07 ENCOUNTER — Ambulatory Visit (INDEPENDENT_AMBULATORY_CARE_PROVIDER_SITE_OTHER): Payer: PPO | Admitting: Orthopaedic Surgery

## 2017-06-07 ENCOUNTER — Encounter (INDEPENDENT_AMBULATORY_CARE_PROVIDER_SITE_OTHER): Payer: Self-pay | Admitting: Orthopaedic Surgery

## 2017-06-07 VITALS — BP 128/73 | HR 88 | Temp 97.8°F | Ht 72.0 in | Wt 220.0 lb

## 2017-06-07 DIAGNOSIS — M25562 Pain in left knee: Secondary | ICD-10-CM | POA: Diagnosis not present

## 2017-06-07 DIAGNOSIS — M25561 Pain in right knee: Secondary | ICD-10-CM | POA: Diagnosis not present

## 2017-06-07 DIAGNOSIS — S72001A Fracture of unspecified part of neck of right femur, initial encounter for closed fracture: Secondary | ICD-10-CM

## 2017-06-07 DIAGNOSIS — G8929 Other chronic pain: Secondary | ICD-10-CM

## 2017-06-07 MED ORDER — DICLOFENAC SODIUM 1 % TD GEL: 4.0000 g | Freq: Four times a day (QID) | TRANSDERMAL | Status: DC

## 2017-06-07 NOTE — Progress Notes (Signed)
Post-Op Visit Note   Patient: Joel [name]           Date of Birth: [birth year]           MRN: [id number] Visit Date: 06/07/2017 PCP: Wenda Low, MD   Assessment & Plan:  Chief Complaint:  Chief Complaint  Patient presents with  . Right Hip - Follow-up, Pain   Visit Diagnoses:  1. Closed displaced fracture of right femoral neck ([place])   2. Chronic pain of both knees     Plan: Patient can increase weightbearing from 50 to 75% and then progressed to full weightbearing as tolerated with a cane.  I will see him back again in 3 months.  He has bilateral tricompartmental knee arthritis worse on the right than left knee.  He requested a refill of Voltaren gel which was sent in for him.  Recheck 3 months.  Follow-Up Instructions: No follow-ups on file.   Orders:  Orders Placed This Encounter  Procedures  . XR HIP UNILAT W OR W/O PELVIS 2-3 VIEWS RIGHT  . XR Knee 1-2 Views Right  . XR Knee 1-2 Views Left   No orders of the defined types were placed in this encounter.   Imaging: No results found.  PMFS History: Patient Active Problem List   Diagnosis Date Noted  . Closed displaced fracture of right femoral neck ([place]) 03/22/2017  . Cough 01/17/2017  . Persistent atrial fibrillation ([place]) 01/17/2017  . Leg swelling 09/26/2016  . GI bleed 05/15/2016  . UGI bleed 05/14/2016  . COPD (chronic obstructive pulmonary disease) ([place]) 05/14/2016  . Chronic GI bleeding 04/12/2016  . HLD (hyperlipidemia) 04/12/2016  . Anemia due to chronic blood loss 04/12/2016  . Diabetes mellitus with complication ([place])   . BPPV (benign paroxysmal positional vertigo) 01/27/2016  . Hypertension 01/27/2016  . Malignant neoplasm of prostate ([place]) 01/07/2016  . Paroxysmal atrial fibrillation (HCC)   . Bipolar I disorder ([place])   . Bright red rectal bleeding 12/16/2015  . Rectal bleeding 12/16/2015  . Pulmonary hypertension ([place]) 06/29/2015  . Moderate aortic stenosis 04/17/2013  . Encounter  for monitoring Coumadin therapy 04/17/2013  . Anemia, iron deficiency 04/04/2013  . Dyspnea on exertion 11/18/2012  . Obesity hypoventilation syndrome ([place]) 09/19/2012  . ALLERGIC RHINITIS 06/24/2009  . DM2 (diabetes mellitus, type 2) ([place]) 03/13/2007  . OSA (obstructive sleep apnea) 03/13/2007  . COPD mixed type ([place]) 03/13/2007   Past Medical History:  Diagnosis Date  . Allergic rhinitis   . Anemia   . Anxiety   . Aortic stenosis 2012   mild to mod   . Atrial fibrillation ([place]) 1991   with multiple DCCV  . Bipolar affective disorder ([place])   . COPD (chronic obstructive pulmonary disease) ([place])   . Diabetic neuropathy ([place])    in feet  . Diabetic neuropathy ([place])   . Dyslipidemia   . Fatigue    last year or so  . Gout   . Hypertension   . Insomnia   . Obesity   . On home oxygen therapy 2 1/2 liters with bipap at night  . OSA (obstructive sleep apnea)    severe, uses bipap 16 1/2 by 12 or 13 setting  . Prostate cancer ([place])   . Rectal bleeding 12/16/2015  . Type 2 diabetes mellitus ([place])   . Vertigo     Family History  Problem Relation Age of Onset  . Tuberculosis Maternal Grandmother   . Heart attack Neg Hx   .  Diabetes Neg Hx   . CAD Neg Hx   . Cancer Neg Hx     Past Surgical History:  Procedure Laterality Date  . CARDIOVERSION  yrs ago   prior to 1998  . COLONOSCOPY WITH PROPOFOL N/A 01/23/2013   Procedure: COLONOSCOPY WITH PROPOFOL;  Surgeon: Garlan Fair, MD;  [facility];  Service: Endoscopy;  Laterality: N/A;  . electro shock  1969   for depression  . ENTEROSCOPY Left 05/17/2016   Procedure: ENTEROSCOPY;  Surgeon: Wilford Corner, MD;  [facility];  Service: Endoscopy;  Laterality: Left;  . ESOPHAGOGASTRODUODENOSCOPY N/A 03/03/2016   Procedure: ESOPHAGOGASTRODUODENOSCOPY (EGD);  Surgeon: Teena Irani, MD;  [facility];  Service: Endoscopy;  Laterality: N/A;  . ESOPHAGOGASTRODUODENOSCOPY N/A 03/26/2016   Procedure:  ESOPHAGOGASTRODUODENOSCOPY (EGD);  Surgeon: Teena Irani, MD;  Location: [facility] ENDOSCOPY;  Service: Endoscopy;  Laterality: N/A;  would like to use ultraslim scope  . ESOPHAGOGASTRODUODENOSCOPY (EGD) WITH PROPOFOL N/A 01/23/2013   Procedure: ESOPHAGOGASTRODUODENOSCOPY (EGD) WITH PROPOFOL;  Surgeon: Garlan Fair, MD;  [facility];  Service: Endoscopy;  Laterality: N/A;  . ESOPHAGOGASTRODUODENOSCOPY (EGD) WITH PROPOFOL N/A 04/13/2016   Procedure: ESOPHAGOGASTRODUODENOSCOPY (EGD) WITH PROPOFOL;  Surgeon: Otis Brace, MD;  Location: [place];  Service: Gastroenterology;  Laterality: N/A;  . GIVENS CAPSULE STUDY N/A 03/03/2016   Procedure: GIVENS CAPSULE STUDY;  Surgeon: Teena Irani, MD;  [facility];  Service: Endoscopy;  Laterality: N/A;  . HEMORRHOID SURGERY N/A 12/16/2015   Procedure: PROCTOSCOPY WITH CONTROL OF BLEEDING;  Surgeon: Fanny Skates, MD;  Location: [place];  Service: General;  Laterality: N/A;  . HIP PINNING,CANNULATED Right 03/23/2017   Procedure: CANNULATED HIP PINNING;  Surgeon: Marybelle Killings, MD;  [facility];  Service: Orthopedics;  Laterality: Right;  . KNEE SURGERY  1970's   rt  . MAZE  1998  . PROSTATE BIOPSY    . SHOULDER SURGERY  7-8 yrs ago   rt   Social History   Occupational History  . Occupation: Surveyor  Tobacco Use  . Smoking status: Former Smoker    Packs/day: 2.00    Years: 25.00    Pack years: 50.00    Types: Cigarettes    Last attempt to quit: 07/08/1976    Years since quitting: 40.9  . Smokeless tobacco: Never Used  Substance and Sexual Activity  . Alcohol use: No    Comment: in AA since 1977, no alcohol since 1977  . Drug use: No    Types: Marijuana    Comment: marijuana use 37 years ago  . Sexual activity: Yes

## 2017-06-08 DIAGNOSIS — N189 Chronic kidney disease, unspecified: Secondary | ICD-10-CM | POA: Diagnosis not present

## 2017-06-08 DIAGNOSIS — I482 Chronic atrial fibrillation: Secondary | ICD-10-CM | POA: Diagnosis not present

## 2017-06-08 DIAGNOSIS — J309 Allergic rhinitis, unspecified: Secondary | ICD-10-CM | POA: Diagnosis not present

## 2017-06-08 DIAGNOSIS — E1122 Type 2 diabetes mellitus with diabetic chronic kidney disease: Secondary | ICD-10-CM | POA: Diagnosis not present

## 2017-06-08 DIAGNOSIS — I129 Hypertensive chronic kidney disease with stage 1 through stage 4 chronic kidney disease, or unspecified chronic kidney disease: Secondary | ICD-10-CM | POA: Diagnosis not present

## 2017-06-08 DIAGNOSIS — D509 Iron deficiency anemia, unspecified: Secondary | ICD-10-CM | POA: Diagnosis not present

## 2017-06-08 DIAGNOSIS — S72011D Unspecified intracapsular fracture of right femur, subsequent encounter for closed fracture with routine healing: Secondary | ICD-10-CM | POA: Diagnosis not present

## 2017-06-08 DIAGNOSIS — F329 Major depressive disorder, single episode, unspecified: Secondary | ICD-10-CM | POA: Diagnosis not present

## 2017-06-08 DIAGNOSIS — E1140 Type 2 diabetes mellitus with diabetic neuropathy, unspecified: Secondary | ICD-10-CM | POA: Diagnosis not present

## 2017-06-08 DIAGNOSIS — M1000 Idiopathic gout, unspecified site: Secondary | ICD-10-CM | POA: Diagnosis not present

## 2017-06-08 DIAGNOSIS — E559 Vitamin D deficiency, unspecified: Secondary | ICD-10-CM | POA: Diagnosis not present

## 2017-06-08 DIAGNOSIS — F319 Bipolar disorder, unspecified: Secondary | ICD-10-CM | POA: Diagnosis not present

## 2017-06-08 DIAGNOSIS — J449 Chronic obstructive pulmonary disease, unspecified: Secondary | ICD-10-CM | POA: Diagnosis not present

## 2017-06-10 ENCOUNTER — Telehealth (INDEPENDENT_AMBULATORY_CARE_PROVIDER_SITE_OTHER): Payer: Self-pay | Admitting: Orthopaedic Surgery

## 2017-06-10 DIAGNOSIS — S72011D Unspecified intracapsular fracture of right femur, subsequent encounter for closed fracture with routine healing: Secondary | ICD-10-CM | POA: Diagnosis not present

## 2017-06-10 DIAGNOSIS — Z9181 History of falling: Secondary | ICD-10-CM | POA: Diagnosis not present

## 2017-06-10 DIAGNOSIS — E662 Morbid (severe) obesity with alveolar hypoventilation: Secondary | ICD-10-CM | POA: Diagnosis not present

## 2017-06-10 DIAGNOSIS — G4733 Obstructive sleep apnea (adult) (pediatric): Secondary | ICD-10-CM | POA: Diagnosis not present

## 2017-06-10 DIAGNOSIS — J410 Simple chronic bronchitis: Secondary | ICD-10-CM | POA: Diagnosis not present

## 2017-06-10 DIAGNOSIS — C189 Malignant neoplasm of colon, unspecified: Secondary | ICD-10-CM | POA: Diagnosis not present

## 2017-06-10 DIAGNOSIS — M6281 Muscle weakness (generalized): Secondary | ICD-10-CM | POA: Diagnosis not present

## 2017-06-10 DIAGNOSIS — J449 Chronic obstructive pulmonary disease, unspecified: Secondary | ICD-10-CM | POA: Diagnosis not present

## 2017-06-10 NOTE — Telephone Encounter (Signed)
Patient called stating that his pharmacy has not received the prescription for his Voltaren Gel.  [facility] [address]  CB#[phone].  Thank you.

## 2017-06-13 NOTE — Telephone Encounter (Signed)
IC patient LMVM advising Rx called in to Pharmacy for him.  LMVM pharmacy

## 2017-06-22 DIAGNOSIS — J449 Chronic obstructive pulmonary disease, unspecified: Secondary | ICD-10-CM | POA: Diagnosis not present

## 2017-06-22 DIAGNOSIS — I739 Peripheral vascular disease, unspecified: Secondary | ICD-10-CM | POA: Diagnosis not present

## 2017-06-22 DIAGNOSIS — I509 Heart failure, unspecified: Secondary | ICD-10-CM | POA: Diagnosis not present

## 2017-06-22 DIAGNOSIS — G4733 Obstructive sleep apnea (adult) (pediatric): Secondary | ICD-10-CM | POA: Diagnosis not present

## 2017-06-22 DIAGNOSIS — Z7984 Long term (current) use of oral hypoglycemic drugs: Secondary | ICD-10-CM | POA: Diagnosis not present

## 2017-06-22 DIAGNOSIS — D509 Iron deficiency anemia, unspecified: Secondary | ICD-10-CM | POA: Diagnosis not present

## 2017-06-22 DIAGNOSIS — I482 Chronic atrial fibrillation: Secondary | ICD-10-CM | POA: Diagnosis not present

## 2017-06-22 DIAGNOSIS — F419 Anxiety disorder, unspecified: Secondary | ICD-10-CM | POA: Diagnosis not present

## 2017-06-22 DIAGNOSIS — N183 Chronic kidney disease, stage 3 (moderate): Secondary | ICD-10-CM | POA: Diagnosis not present

## 2017-06-22 DIAGNOSIS — I10 Essential (primary) hypertension: Secondary | ICD-10-CM | POA: Diagnosis not present

## 2017-06-22 DIAGNOSIS — I350 Nonrheumatic aortic (valve) stenosis: Secondary | ICD-10-CM | POA: Diagnosis not present

## 2017-06-22 DIAGNOSIS — E1142 Type 2 diabetes mellitus with diabetic polyneuropathy: Secondary | ICD-10-CM | POA: Diagnosis not present

## 2017-06-22 DIAGNOSIS — C61 Malignant neoplasm of prostate: Secondary | ICD-10-CM | POA: Diagnosis not present

## 2017-06-23 ENCOUNTER — Telehealth: Payer: Self-pay | Admitting: Hematology and Oncology

## 2017-06-23 NOTE — Telephone Encounter (Signed)
[facility] office called in to schedule f/u with patient . - they are aware of appt date and time and will contact patient . Per 5/23 sch message.

## 2017-06-28 ENCOUNTER — Inpatient Hospital Stay: Payer: PPO | Attending: Hematology and Oncology | Admitting: Hematology and Oncology

## 2017-06-28 DIAGNOSIS — I509 Heart failure, unspecified: Secondary | ICD-10-CM | POA: Diagnosis not present

## 2017-06-28 DIAGNOSIS — I10 Essential (primary) hypertension: Secondary | ICD-10-CM | POA: Diagnosis not present

## 2017-06-28 DIAGNOSIS — C61 Malignant neoplasm of prostate: Secondary | ICD-10-CM | POA: Diagnosis not present

## 2017-06-28 DIAGNOSIS — E782 Mixed hyperlipidemia: Secondary | ICD-10-CM | POA: Diagnosis not present

## 2017-06-28 DIAGNOSIS — N183 Chronic kidney disease, stage 3 (moderate): Secondary | ICD-10-CM | POA: Diagnosis not present

## 2017-06-28 DIAGNOSIS — E1165 Type 2 diabetes mellitus with hyperglycemia: Secondary | ICD-10-CM | POA: Diagnosis not present

## 2017-06-28 DIAGNOSIS — J449 Chronic obstructive pulmonary disease, unspecified: Secondary | ICD-10-CM | POA: Diagnosis not present

## 2017-06-28 DIAGNOSIS — I482 Chronic atrial fibrillation: Secondary | ICD-10-CM | POA: Diagnosis not present

## 2017-07-11 DIAGNOSIS — Z9181 History of falling: Secondary | ICD-10-CM | POA: Diagnosis not present

## 2017-07-11 DIAGNOSIS — J449 Chronic obstructive pulmonary disease, unspecified: Secondary | ICD-10-CM | POA: Diagnosis not present

## 2017-07-11 DIAGNOSIS — E662 Morbid (severe) obesity with alveolar hypoventilation: Secondary | ICD-10-CM | POA: Diagnosis not present

## 2017-07-11 DIAGNOSIS — C189 Malignant neoplasm of colon, unspecified: Secondary | ICD-10-CM | POA: Diagnosis not present

## 2017-07-11 DIAGNOSIS — S72011D Unspecified intracapsular fracture of right femur, subsequent encounter for closed fracture with routine healing: Secondary | ICD-10-CM | POA: Diagnosis not present

## 2017-07-11 DIAGNOSIS — M6281 Muscle weakness (generalized): Secondary | ICD-10-CM | POA: Diagnosis not present

## 2017-07-11 DIAGNOSIS — J410 Simple chronic bronchitis: Secondary | ICD-10-CM | POA: Diagnosis not present

## 2017-07-11 DIAGNOSIS — G4733 Obstructive sleep apnea (adult) (pediatric): Secondary | ICD-10-CM | POA: Diagnosis not present

## 2017-07-12 ENCOUNTER — Telehealth (INDEPENDENT_AMBULATORY_CARE_PROVIDER_SITE_OTHER): Payer: Self-pay | Admitting: Orthopaedic Surgery

## 2017-07-12 DIAGNOSIS — S72001A Fracture of unspecified part of neck of right femur, initial encounter for closed fracture: Secondary | ICD-10-CM

## 2017-07-12 NOTE — Telephone Encounter (Signed)
Alden Server [facility] daughter) called asked if patient can be referred out to outpatient therapy . [place]. The fax# is [phone]  The ph# [phone]  The number to contact Anderson [place] is [phone]

## 2017-07-12 NOTE — Telephone Encounter (Signed)
Please advise 

## 2017-07-12 NOTE — Telephone Encounter (Signed)
Script faxed and entered into system.  I left voicemail for granddaughter advising.

## 2017-07-12 NOTE — Telephone Encounter (Signed)
OK to do thanks. Knee OA and healed acetabular Fx. Healed IT hip fx.  Ambulation and LE strengthening. thanks

## 2017-07-21 DIAGNOSIS — M25651 Stiffness of right hip, not elsewhere classified: Secondary | ICD-10-CM | POA: Diagnosis not present

## 2017-07-21 DIAGNOSIS — M25551 Pain in right hip: Secondary | ICD-10-CM | POA: Diagnosis not present

## 2017-07-21 DIAGNOSIS — R262 Difficulty in walking, not elsewhere classified: Secondary | ICD-10-CM | POA: Diagnosis not present

## 2017-07-21 DIAGNOSIS — R531 Weakness: Secondary | ICD-10-CM | POA: Diagnosis not present

## 2017-07-26 DIAGNOSIS — R262 Difficulty in walking, not elsewhere classified: Secondary | ICD-10-CM | POA: Diagnosis not present

## 2017-07-26 DIAGNOSIS — M25651 Stiffness of right hip, not elsewhere classified: Secondary | ICD-10-CM | POA: Diagnosis not present

## 2017-07-26 DIAGNOSIS — M25551 Pain in right hip: Secondary | ICD-10-CM | POA: Diagnosis not present

## 2017-07-26 DIAGNOSIS — R531 Weakness: Secondary | ICD-10-CM | POA: Diagnosis not present

## 2017-07-28 DIAGNOSIS — R531 Weakness: Secondary | ICD-10-CM | POA: Diagnosis not present

## 2017-07-28 DIAGNOSIS — R262 Difficulty in walking, not elsewhere classified: Secondary | ICD-10-CM | POA: Diagnosis not present

## 2017-07-28 DIAGNOSIS — M25551 Pain in right hip: Secondary | ICD-10-CM | POA: Diagnosis not present

## 2017-07-28 DIAGNOSIS — M25651 Stiffness of right hip, not elsewhere classified: Secondary | ICD-10-CM | POA: Diagnosis not present

## 2017-08-09 DIAGNOSIS — M25551 Pain in right hip: Secondary | ICD-10-CM | POA: Diagnosis not present

## 2017-08-09 DIAGNOSIS — R531 Weakness: Secondary | ICD-10-CM | POA: Diagnosis not present

## 2017-08-09 DIAGNOSIS — R262 Difficulty in walking, not elsewhere classified: Secondary | ICD-10-CM | POA: Diagnosis not present

## 2017-08-09 DIAGNOSIS — M25651 Stiffness of right hip, not elsewhere classified: Secondary | ICD-10-CM | POA: Diagnosis not present

## 2017-08-11 DIAGNOSIS — R262 Difficulty in walking, not elsewhere classified: Secondary | ICD-10-CM | POA: Diagnosis not present

## 2017-08-11 DIAGNOSIS — M25551 Pain in right hip: Secondary | ICD-10-CM | POA: Diagnosis not present

## 2017-08-11 DIAGNOSIS — M25651 Stiffness of right hip, not elsewhere classified: Secondary | ICD-10-CM | POA: Diagnosis not present

## 2017-08-11 DIAGNOSIS — R531 Weakness: Secondary | ICD-10-CM | POA: Diagnosis not present

## 2017-08-15 DIAGNOSIS — R262 Difficulty in walking, not elsewhere classified: Secondary | ICD-10-CM | POA: Diagnosis not present

## 2017-08-15 DIAGNOSIS — I482 Chronic atrial fibrillation: Secondary | ICD-10-CM | POA: Diagnosis not present

## 2017-08-15 DIAGNOSIS — F3341 Major depressive disorder, recurrent, in partial remission: Secondary | ICD-10-CM | POA: Diagnosis not present

## 2017-08-15 DIAGNOSIS — E1142 Type 2 diabetes mellitus with diabetic polyneuropathy: Secondary | ICD-10-CM | POA: Diagnosis not present

## 2017-08-15 DIAGNOSIS — C61 Malignant neoplasm of prostate: Secondary | ICD-10-CM | POA: Diagnosis not present

## 2017-08-15 DIAGNOSIS — D509 Iron deficiency anemia, unspecified: Secondary | ICD-10-CM | POA: Diagnosis not present

## 2017-08-15 DIAGNOSIS — M25551 Pain in right hip: Secondary | ICD-10-CM | POA: Diagnosis not present

## 2017-08-15 DIAGNOSIS — D649 Anemia, unspecified: Secondary | ICD-10-CM | POA: Diagnosis not present

## 2017-08-15 DIAGNOSIS — J449 Chronic obstructive pulmonary disease, unspecified: Secondary | ICD-10-CM | POA: Diagnosis not present

## 2017-08-15 DIAGNOSIS — N183 Chronic kidney disease, stage 3 (moderate): Secondary | ICD-10-CM | POA: Diagnosis not present

## 2017-08-15 DIAGNOSIS — I509 Heart failure, unspecified: Secondary | ICD-10-CM | POA: Diagnosis not present

## 2017-08-15 DIAGNOSIS — E1165 Type 2 diabetes mellitus with hyperglycemia: Secondary | ICD-10-CM | POA: Diagnosis not present

## 2017-08-15 DIAGNOSIS — E782 Mixed hyperlipidemia: Secondary | ICD-10-CM | POA: Diagnosis not present

## 2017-08-15 DIAGNOSIS — M25651 Stiffness of right hip, not elsewhere classified: Secondary | ICD-10-CM | POA: Diagnosis not present

## 2017-08-15 DIAGNOSIS — R531 Weakness: Secondary | ICD-10-CM | POA: Diagnosis not present

## 2017-08-15 DIAGNOSIS — I10 Essential (primary) hypertension: Secondary | ICD-10-CM | POA: Diagnosis not present

## 2017-08-30 ENCOUNTER — Other Ambulatory Visit: Payer: Self-pay

## 2017-08-30 ENCOUNTER — Encounter (HOSPITAL_COMMUNITY): Payer: Self-pay

## 2017-08-30 ENCOUNTER — Inpatient Hospital Stay (HOSPITAL_COMMUNITY)
Admission: EM | Admit: 2017-08-30 | Discharge: 2017-09-16 | DRG: 329 | Disposition: A | Discharge status: Home Health | Payer: PPO | Attending: Internal Medicine | Admitting: Internal Medicine

## 2017-08-30 DIAGNOSIS — E1140 Type 2 diabetes mellitus with diabetic neuropathy, unspecified: Secondary | ICD-10-CM | POA: Diagnosis present

## 2017-08-30 DIAGNOSIS — K639 Disease of intestine, unspecified: Secondary | ICD-10-CM | POA: Diagnosis not present

## 2017-08-30 DIAGNOSIS — J440 Chronic obstructive pulmonary disease with acute lower respiratory infection: Secondary | ICD-10-CM | POA: Diagnosis not present

## 2017-08-30 DIAGNOSIS — I350 Nonrheumatic aortic (valve) stenosis: Secondary | ICD-10-CM | POA: Diagnosis present

## 2017-08-30 DIAGNOSIS — K55039 Acute (reversible) ischemia of large intestine, extent unspecified: Secondary | ICD-10-CM | POA: Diagnosis not present

## 2017-08-30 DIAGNOSIS — C180 Malignant neoplasm of cecum: Secondary | ICD-10-CM | POA: Diagnosis not present

## 2017-08-30 DIAGNOSIS — Z87891 Personal history of nicotine dependence: Secondary | ICD-10-CM

## 2017-08-30 DIAGNOSIS — K2970 Gastritis, unspecified, without bleeding: Secondary | ICD-10-CM | POA: Diagnosis not present

## 2017-08-30 DIAGNOSIS — D62 Acute posthemorrhagic anemia: Secondary | ICD-10-CM | POA: Diagnosis not present

## 2017-08-30 DIAGNOSIS — D500 Iron deficiency anemia secondary to blood loss (chronic): Secondary | ICD-10-CM | POA: Diagnosis not present

## 2017-08-30 DIAGNOSIS — Z9889 Other specified postprocedural states: Secondary | ICD-10-CM

## 2017-08-30 DIAGNOSIS — Y95 Nosocomial condition: Secondary | ICD-10-CM | POA: Diagnosis not present

## 2017-08-30 DIAGNOSIS — K429 Umbilical hernia without obstruction or gangrene: Secondary | ICD-10-CM | POA: Diagnosis not present

## 2017-08-30 DIAGNOSIS — Z9049 Acquired absence of other specified parts of digestive tract: Secondary | ICD-10-CM

## 2017-08-30 DIAGNOSIS — Z8546 Personal history of malignant neoplasm of prostate: Secondary | ICD-10-CM

## 2017-08-30 DIAGNOSIS — R4182 Altered mental status, unspecified: Secondary | ICD-10-CM

## 2017-08-30 DIAGNOSIS — I2510 Atherosclerotic heart disease of native coronary artery without angina pectoris: Secondary | ICD-10-CM | POA: Diagnosis not present

## 2017-08-30 DIAGNOSIS — J309 Allergic rhinitis, unspecified: Secondary | ICD-10-CM | POA: Diagnosis present

## 2017-08-30 DIAGNOSIS — B3781 Candidal esophagitis: Secondary | ICD-10-CM | POA: Diagnosis present

## 2017-08-30 DIAGNOSIS — K2980 Duodenitis without bleeding: Secondary | ICD-10-CM | POA: Diagnosis present

## 2017-08-30 DIAGNOSIS — Z79899 Other long term (current) drug therapy: Secondary | ICD-10-CM

## 2017-08-30 DIAGNOSIS — G4733 Obstructive sleep apnea (adult) (pediatric): Secondary | ICD-10-CM | POA: Diagnosis present

## 2017-08-30 DIAGNOSIS — E1142 Type 2 diabetes mellitus with diabetic polyneuropathy: Secondary | ICD-10-CM | POA: Diagnosis not present

## 2017-08-30 DIAGNOSIS — D509 Iron deficiency anemia, unspecified: Secondary | ICD-10-CM | POA: Diagnosis not present

## 2017-08-30 DIAGNOSIS — I130 Hypertensive heart and chronic kidney disease with heart failure and stage 1 through stage 4 chronic kidney disease, or unspecified chronic kidney disease: Secondary | ICD-10-CM | POA: Diagnosis present

## 2017-08-30 DIAGNOSIS — R4020 Unspecified coma: Secondary | ICD-10-CM | POA: Diagnosis not present

## 2017-08-30 DIAGNOSIS — I5033 Acute on chronic diastolic (congestive) heart failure: Secondary | ICD-10-CM | POA: Diagnosis present

## 2017-08-30 DIAGNOSIS — M109 Gout, unspecified: Secondary | ICD-10-CM | POA: Diagnosis present

## 2017-08-30 DIAGNOSIS — Z7984 Long term (current) use of oral hypoglycemic drugs: Secondary | ICD-10-CM

## 2017-08-30 DIAGNOSIS — E669 Obesity, unspecified: Secondary | ICD-10-CM | POA: Diagnosis present

## 2017-08-30 DIAGNOSIS — K91841 Postprocedural hemorrhage and hematoma of a digestive system organ or structure following other procedure: Secondary | ICD-10-CM | POA: Diagnosis not present

## 2017-08-30 DIAGNOSIS — G8929 Other chronic pain: Secondary | ICD-10-CM

## 2017-08-30 DIAGNOSIS — J90 Pleural effusion, not elsewhere classified: Secondary | ICD-10-CM

## 2017-08-30 DIAGNOSIS — N179 Acute kidney failure, unspecified: Secondary | ICD-10-CM | POA: Diagnosis not present

## 2017-08-30 DIAGNOSIS — R091 Pleurisy: Secondary | ICD-10-CM | POA: Diagnosis not present

## 2017-08-30 DIAGNOSIS — J9610 Chronic respiratory failure, unspecified whether with hypoxia or hypercapnia: Secondary | ICD-10-CM | POA: Diagnosis not present

## 2017-08-30 DIAGNOSIS — K5730 Diverticulosis of large intestine without perforation or abscess without bleeding: Secondary | ICD-10-CM | POA: Diagnosis not present

## 2017-08-30 DIAGNOSIS — N183 Chronic kidney disease, stage 3 (moderate): Secondary | ICD-10-CM | POA: Diagnosis not present

## 2017-08-30 DIAGNOSIS — M25561 Pain in right knee: Secondary | ICD-10-CM

## 2017-08-30 DIAGNOSIS — L89152 Pressure ulcer of sacral region, stage 2: Secondary | ICD-10-CM | POA: Diagnosis present

## 2017-08-30 DIAGNOSIS — R195 Other fecal abnormalities: Secondary | ICD-10-CM | POA: Diagnosis not present

## 2017-08-30 DIAGNOSIS — C182 Malignant neoplasm of ascending colon: Principal | ICD-10-CM | POA: Diagnosis present

## 2017-08-30 DIAGNOSIS — L8990 Pressure ulcer of unspecified site, unspecified stage: Secondary | ICD-10-CM

## 2017-08-30 DIAGNOSIS — K921 Melena: Secondary | ICD-10-CM | POA: Diagnosis present

## 2017-08-30 DIAGNOSIS — I361 Nonrheumatic tricuspid (valve) insufficiency: Secondary | ICD-10-CM | POA: Diagnosis not present

## 2017-08-30 DIAGNOSIS — G4700 Insomnia, unspecified: Secondary | ICD-10-CM | POA: Diagnosis present

## 2017-08-30 DIAGNOSIS — R0600 Dyspnea, unspecified: Secondary | ICD-10-CM | POA: Diagnosis not present

## 2017-08-30 DIAGNOSIS — J811 Chronic pulmonary edema: Secondary | ICD-10-CM | POA: Diagnosis not present

## 2017-08-30 DIAGNOSIS — I5031 Acute diastolic (congestive) heart failure: Secondary | ICD-10-CM | POA: Diagnosis not present

## 2017-08-30 DIAGNOSIS — F319 Bipolar disorder, unspecified: Secondary | ICD-10-CM | POA: Diagnosis present

## 2017-08-30 DIAGNOSIS — K8020 Calculus of gallbladder without cholecystitis without obstruction: Secondary | ICD-10-CM | POA: Diagnosis not present

## 2017-08-30 DIAGNOSIS — J189 Pneumonia, unspecified organism: Secondary | ICD-10-CM

## 2017-08-30 DIAGNOSIS — Z01810 Encounter for preprocedural cardiovascular examination: Secondary | ICD-10-CM | POA: Diagnosis not present

## 2017-08-30 DIAGNOSIS — G9341 Metabolic encephalopathy: Secondary | ICD-10-CM | POA: Diagnosis not present

## 2017-08-30 DIAGNOSIS — Z951 Presence of aortocoronary bypass graft: Secondary | ICD-10-CM

## 2017-08-30 DIAGNOSIS — E119 Type 2 diabetes mellitus without complications: Secondary | ICD-10-CM | POA: Diagnosis not present

## 2017-08-30 DIAGNOSIS — K219 Gastro-esophageal reflux disease without esophagitis: Secondary | ICD-10-CM | POA: Diagnosis present

## 2017-08-30 DIAGNOSIS — Z7901 Long term (current) use of anticoagulants: Secondary | ICD-10-CM

## 2017-08-30 DIAGNOSIS — F419 Anxiety disorder, unspecified: Secondary | ICD-10-CM | POA: Diagnosis present

## 2017-08-30 DIAGNOSIS — Z888 Allergy status to other drugs, medicaments and biological substances status: Secondary | ICD-10-CM

## 2017-08-30 DIAGNOSIS — E785 Hyperlipidemia, unspecified: Secondary | ICD-10-CM | POA: Diagnosis present

## 2017-08-30 DIAGNOSIS — R0989 Other specified symptoms and signs involving the circulatory and respiratory systems: Secondary | ICD-10-CM | POA: Diagnosis not present

## 2017-08-30 DIAGNOSIS — I481 Persistent atrial fibrillation: Secondary | ICD-10-CM | POA: Diagnosis not present

## 2017-08-30 DIAGNOSIS — K922 Gastrointestinal hemorrhage, unspecified: Secondary | ICD-10-CM | POA: Diagnosis present

## 2017-08-30 DIAGNOSIS — D638 Anemia in other chronic diseases classified elsewhere: Secondary | ICD-10-CM | POA: Diagnosis not present

## 2017-08-30 DIAGNOSIS — S72001D Fracture of unspecified part of neck of right femur, subsequent encounter for closed fracture with routine healing: Secondary | ICD-10-CM

## 2017-08-30 DIAGNOSIS — I272 Pulmonary hypertension, unspecified: Secondary | ICD-10-CM | POA: Diagnosis present

## 2017-08-30 DIAGNOSIS — E876 Hypokalemia: Secondary | ICD-10-CM | POA: Diagnosis not present

## 2017-08-30 DIAGNOSIS — D649 Anemia, unspecified: Secondary | ICD-10-CM

## 2017-08-30 DIAGNOSIS — K621 Rectal polyp: Secondary | ICD-10-CM | POA: Diagnosis present

## 2017-08-30 DIAGNOSIS — J918 Pleural effusion in other conditions classified elsewhere: Secondary | ICD-10-CM | POA: Diagnosis not present

## 2017-08-30 DIAGNOSIS — I10 Essential (primary) hypertension: Secondary | ICD-10-CM | POA: Diagnosis present

## 2017-08-30 DIAGNOSIS — Z9981 Dependence on supplemental oxygen: Secondary | ICD-10-CM

## 2017-08-30 DIAGNOSIS — K635 Polyp of colon: Secondary | ICD-10-CM | POA: Diagnosis not present

## 2017-08-30 DIAGNOSIS — R0602 Shortness of breath: Secondary | ICD-10-CM | POA: Diagnosis not present

## 2017-08-30 DIAGNOSIS — I482 Chronic atrial fibrillation: Secondary | ICD-10-CM | POA: Diagnosis not present

## 2017-08-30 DIAGNOSIS — M8588 Other specified disorders of bone density and structure, other site: Secondary | ICD-10-CM | POA: Diagnosis not present

## 2017-08-30 DIAGNOSIS — E1122 Type 2 diabetes mellitus with diabetic chronic kidney disease: Secondary | ICD-10-CM | POA: Diagnosis present

## 2017-08-30 DIAGNOSIS — M25562 Pain in left knee: Secondary | ICD-10-CM

## 2017-08-30 DIAGNOSIS — K91840 Postprocedural hemorrhage and hematoma of a digestive system organ or structure following a digestive system procedure: Secondary | ICD-10-CM | POA: Diagnosis not present

## 2017-08-30 DIAGNOSIS — Z880 Allergy status to penicillin: Secondary | ICD-10-CM

## 2017-08-30 DIAGNOSIS — J449 Chronic obstructive pulmonary disease, unspecified: Secondary | ICD-10-CM | POA: Diagnosis not present

## 2017-08-30 DIAGNOSIS — H8110 Benign paroxysmal vertigo, unspecified ear: Secondary | ICD-10-CM | POA: Diagnosis present

## 2017-08-30 DIAGNOSIS — I509 Heart failure, unspecified: Secondary | ICD-10-CM

## 2017-08-30 DIAGNOSIS — Z7951 Long term (current) use of inhaled steroids: Secondary | ICD-10-CM

## 2017-08-30 DIAGNOSIS — Z6833 Body mass index (BMI) 33.0-33.9, adult: Secondary | ICD-10-CM

## 2017-08-30 DIAGNOSIS — I4819 Other persistent atrial fibrillation: Secondary | ICD-10-CM | POA: Diagnosis present

## 2017-08-30 DIAGNOSIS — K9189 Other postprocedural complications and disorders of digestive system: Secondary | ICD-10-CM | POA: Diagnosis not present

## 2017-08-30 DIAGNOSIS — I5020 Unspecified systolic (congestive) heart failure: Secondary | ICD-10-CM | POA: Diagnosis not present

## 2017-08-30 HISTORY — DX: Heart failure, unspecified: I50.9

## 2017-08-30 HISTORY — DX: Malignant neoplasm of ascending colon: C18.2

## 2017-08-30 LAB — BASIC METABOLIC PANEL
Anion gap: 9 (ref 5–15)
BUN: 26 mg/dL — ABNORMAL HIGH (ref 8–23)
CALCIUM: 9.3 mg/dL (ref 8.9–10.3)
CO2: 26 mmol/L (ref 22–32)
CREATININE: 1.76 mg/dL — AB (ref 0.61–1.24)
Chloride: 106 mmol/L (ref 98–111)
GFR calc non Af Amer: 34 mL/min — ABNORMAL LOW (ref >60)
GFR, EST AFRICAN AMERICAN: 40 mL/min — AB (ref >60)
GLUCOSE: 186 mg/dL — AB (ref 70–99)
Potassium: 4.4 mmol/L (ref 3.5–5.1)
Sodium: 141 mmol/L (ref 135–145)

## 2017-08-30 LAB — CBC
HCT: 24.2 % — ABNORMAL LOW (ref 39.0–52.0)
Hemoglobin: 7.3 g/dL — ABNORMAL LOW (ref 13.0–17.0)
MCH: 29.1 pg (ref 26.0–34.0)
MCHC: 30.2 g/dL (ref 30.0–36.0)
MCV: 96.4 fL (ref 78.0–100.0)
PLATELETS: 196 10*3/uL (ref 150–400)
RBC: 2.51 MIL/uL — ABNORMAL LOW (ref 4.22–5.81)
RDW: 18.7 % — ABNORMAL HIGH (ref 11.5–15.5)
WBC: 5.1 10*3/uL (ref 4.0–10.5)

## 2017-08-30 LAB — GLUCOSE, CAPILLARY: Glucose-Capillary: 155 mg/dL — ABNORMAL HIGH (ref 70–99)

## 2017-08-30 LAB — POC OCCULT BLOOD, ED: FECAL OCCULT BLD: POSITIVE — AB

## 2017-08-30 LAB — CBG MONITORING, ED: Glucose-Capillary: 115 mg/dL — ABNORMAL HIGH (ref 70–99)

## 2017-08-30 LAB — PROTIME-INR
INR: 1.07
PROTHROMBIN TIME: 13.9 s (ref 11.4–15.2)

## 2017-08-30 LAB — PREPARE RBC (CROSSMATCH)

## 2017-08-30 MED ORDER — SODIUM CHLORIDE 0.9 % IV SOLN
10.0000 mL/h | Freq: Once | INTRAVENOUS | Status: AC
  Administered 2017-08-31: 10 mL/h via INTRAVENOUS

## 2017-08-30 MED ORDER — INSULIN ASPART 100 UNIT/ML ~~LOC~~ SOLN
0.0000 [IU] | SUBCUTANEOUS | Status: DC
  Administered 2017-08-31 (×2): 2 [IU] via SUBCUTANEOUS
  Administered 2017-08-31: 5 [IU] via SUBCUTANEOUS

## 2017-08-30 MED ORDER — ACETAMINOPHEN 650 MG RE SUPP: 650.0000 mg | Freq: Four times a day (QID) | RECTAL | Status: DC | PRN

## 2017-08-30 MED ORDER — ONDANSETRON HCL 4 MG/2ML IJ SOLN
4.0000 mg | Freq: Four times a day (QID) | INTRAMUSCULAR | Status: DC | PRN
  Administered 2017-08-31: 4 mg via INTRAVENOUS

## 2017-08-30 MED ORDER — ACETAMINOPHEN 325 MG PO TABS
650.0000 mg | ORAL_TABLET | Freq: Four times a day (QID) | ORAL | Status: DC | PRN
  Administered 2017-08-31 – 2017-09-04 (×4): 650 mg via ORAL
  Filled 2017-08-30 (×4): qty 2

## 2017-08-30 MED ORDER — ONDANSETRON HCL 4 MG PO TABS: 4.0000 mg | ORAL_TABLET | Freq: Four times a day (QID) | ORAL | Status: DC | PRN

## 2017-08-30 NOTE — ED Provider Notes (Addendum)
[facility] DEPT Provider Note   CSN: [id number] Arrival date & time: 08/30/17  1716     History   Chief Complaint Chief Complaint  Patient presents with  . Abnormal Lab    HPI Joel [name] is a 84 y.o. male.  HPI Pt states he has a history of anemia.   Last week he started to feel weak.  He went to his doctor today to get a bone density test.  He was concerned his hgb was low so he asked them to check it.  It was low and he was sent to the ED.  Pt has noticed his stools looking dark recently.  No hematemesis.  No cp.    Past Medical History:  Diagnosis Date  . Allergic rhinitis   . Anemia   . Anxiety   . Aortic stenosis 2012   mild to mod   . Atrial fibrillation ([place]) 1991   with multiple DCCV  . Bipolar affective disorder ([place])   . CHF (congestive heart failure) ([place]) 08/30/2017  . COPD (chronic obstructive pulmonary disease) ([place])   . Diabetic neuropathy ([place])    in feet  . Diabetic neuropathy ([place])   . Dyslipidemia   . Fatigue    last year or so  . Gout   . Hypertension   . Insomnia   . Obesity   . On home oxygen therapy 2 1/2 liters with bipap at night  . OSA (obstructive sleep apnea)    severe, uses bipap 16 1/2 by 12 or 13 setting  . Prostate cancer ([name])   . Rectal bleeding 12/16/2015  . Type 2 diabetes mellitus ([place])   . Vertigo     Patient Active Problem List   Diagnosis Date Noted  . Closed displaced fracture of right femoral neck ([place]) 03/22/2017  . Cough 01/17/2017  . Persistent atrial fibrillation ([place]) 01/17/2017  . Leg swelling 09/26/2016  . GI bleed 05/15/2016  . UGI bleed 05/14/2016  . COPD (chronic obstructive pulmonary disease) ([place]) 05/14/2016  . Chronic GI bleeding 04/12/2016  . HLD (hyperlipidemia) 04/12/2016  . Anemia due to chronic blood loss 04/12/2016  . Diabetes mellitus with complication ([place])   . BPPV (benign paroxysmal positional vertigo) 01/27/2016  . Hypertension 01/27/2016  . Malignant  neoplasm of prostate ([place]) 01/07/2016  . Paroxysmal atrial fibrillation (H[facility])   . Bipolar I disorder ([place])   . Bright red rectal bleeding 12/16/2015  . Rectal bleeding 12/16/2015  . Pulmonary hypertension ([place]) 06/29/2015  . Moderate aortic stenosis 04/17/2013  . Encounter for monitoring Coumadin therapy 04/17/2013  . Anemia, iron deficiency 04/04/2013  . Dyspnea on exertion 11/18/2012  . Obesity hypoventilation syndrome ([place]) 09/19/2012  . ALLERGIC RHINITIS 06/24/2009  . DM2 (diabetes mellitus, type 2) ([place]) 03/13/2007  . OSA (obstructive sleep apnea) 03/13/2007  . COPD mixed type ([place]) 03/13/2007    Past Surgical History:  Procedure Laterality Date  . CARDIOVERSION  yrs ago   prior to 1998  . COLONOSCOPY WITH PROPOFOL N/A 01/23/2013   Procedure: COLONOSCOPY WITH PROPOFOL;  Surgeon: Garlan Fair, MD;  Location: [facility];  Service: Endoscopy;  Laterality: N/A;  . electro shock  1969   for depression  . ENTEROSCOPY Left 05/17/2016   Procedure: ENTEROSCOPY;  Surgeon: Wilford Corner, MD;  Location: [facility];  Service: Endoscopy;  Laterality: Left;  . ESOPHAGOGASTRODUODENOSCOPY N/A 03/03/2016   Procedure: ESOPHAGOGASTRODUODENOSCOPY (EGD);  Surgeon: Teena Irani, MD;  Location: [facility];  Service: Endoscopy;  Laterality: N/A;  .  ESOPHAGOGASTRODUODENOSCOPY N/A 03/26/2016   Procedure: ESOPHAGOGASTRODUODENOSCOPY (EGD);  Surgeon: Teena Irani, MD;  Location: [facility] ENDOSCOPY;  Service: Endoscopy;  Laterality: N/A;  would like to use ultraslim scope  . ESOPHAGOGASTRODUODENOSCOPY (EGD) WITH PROPOFOL N/A 01/23/2013   Procedure: ESOPHAGOGASTRODUODENOSCOPY (EGD) WITH PROPOFOL;  Surgeon: Garlan Fair, MD;  [facility];  Service: Endoscopy;  Laterality: N/A;  . ESOPHAGOGASTRODUODENOSCOPY (EGD) WITH PROPOFOL N/A 04/13/2016   Procedure: ESOPHAGOGASTRODUODENOSCOPY (EGD) WITH PROPOFOL;  Surgeon: Otis Brace, MD;  Location: [facility];  Service: Gastroenterology;  Laterality:  N/A;  . GIVENS CAPSULE STUDY N/A 03/03/2016   Procedure: GIVENS CAPSULE STUDY;  Surgeon: Teena Irani, MD;  Location: [facility];  Service: Endoscopy;  Laterality: N/A;  . HEMORRHOID SURGERY N/A 12/16/2015   Procedure: PROCTOSCOPY WITH CONTROL OF BLEEDING;  Surgeon: Fanny Skates, MD;  Location: [facility];  Service: General;  Laterality: N/A;  . HIP PINNING,CANNULATED Right 03/23/2017   Procedure: CANNULATED HIP PINNING;  Surgeon: Marybelle Killings, MD;  Location: [facility];  Service: Orthopedics;  Laterality: Right;  . KNEE SURGERY  1970's   rt  . MAZE  1998  . PROSTATE BIOPSY    . SHOULDER SURGERY  7-8 yrs ago   rt        Home Medications    Prior to Admission medications   Medication Sig Start Date End Date Taking? Authorizing Provider  allopurinol (ZYLOPRIM) 100 MG tablet Take 100 mg by mouth daily.     [provider]  Ascorbic Acid (VITAMIN C PO) Take 1 tablet by mouth daily.    [provider]  buPROPion (WELLBUTRIN XL) 150 MG 24 hr tablet Take 1 tablet (150 mg total) by mouth daily. 12/18/15   Ambrose Finland, MD  cholecalciferol (VITAMIN D) 1000 units tablet Take 1,000 Units by mouth daily.    [provider]  citalopram (CELEXA) 40 MG tablet Take 40 mg by mouth daily.     [provider]  Cyanocobalamin (VITAMIN B-12 PO) Take 1 tablet by mouth daily.    [provider]  dextromethorphan-guaiFENesin (MUCINEX DM) 30-600 MG 12hr tablet Take 1 tablet by mouth 2 (two) times daily.    [provider]  diltiazem (CARDIZEM SR) 120 MG 12 hr capsule Take 120 mg by mouth daily.  03/18/17   [provider]  enoxaparin (LOVENOX) 30 MG/0.3ML injection Inject 0.3 mLs (30 mg total) into the skin daily. 03/26/17   Lanae Crumbly, PA-C  fluticasone [facility]) 50 MCG/ACT nasal spray Place 2 sprays into both nostrils daily. Patient taking differently: Place 2 sprays into both nostrils daily as needed for allergies or rhinitis.  01/27/16    Waynetta Pean, PA-C  furosemide (LASIX) 40 MG tablet Take 1 tablet (40 mg total) by mouth daily. 03/27/17   Roxan Hockey, MD  glimepiride (AMARYL) 1 MG tablet Take 1 mg by mouth daily with breakfast.  03/01/17   [provider]  HYDROcodone-acetaminophen (NORCO/VICODIN) 5-325 MG tablet Take 1-2 tablets by mouth every 6 (six) hours as needed for moderate pain. 03/25/17   Lanae Crumbly, PA-C  ipratropium (ATROVENT) 0.06 % nasal spray instill 2 sprays into each nostril four times a day as needed for allergies 01/18/17   [provider]  iron polysaccharides (NIFEREX) 150 MG capsule Take 1 capsule (150 mg total) by mouth 2 (two) times daily. 10/23/14   Eugenie Filler, MD  losartan (COZAAR) 25 MG tablet Take 1 tablet (25 mg total) by mouth daily. 03/30/17   Roxan Hockey, MD  lovastatin (MEVACOR) 20 MG tablet Take 20 mg by mouth at bedtime.     [provider]  meclizine (ANTIVERT) 25 MG tablet Take 1 tablet (25 mg total) by mouth 3 (three) times daily as needed for dizziness. Patient taking differently: Take 25 mg by mouth 2 (two) times daily.  01/28/16   Eber Jones, MD  metFORMIN (GLUCOPHAGE) 1000 MG tablet Take 1,000 mg by mouth 2 (two) times daily.  05/17/14   [provider]  Multiple Vitamins-Minerals (VISION FORMULA PO) Take 1 tablet by mouth 2 (two) times daily.    [provider]  omeprazole (PRILOSEC) 20 MG capsule Take 1 capsule (20 mg total) by mouth 2 (two) times daily. 03/27/17 03/27/18  Roxan Hockey, MD  OXYGEN Inhale 2 L into the lungs as needed (shortness of breath).     [provider]  potassium chloride SA (K-DUR,KLOR-CON) 20 MEQ tablet Take 20 mEq by mouth daily.  09/10/14   [provider]  senna-docusate (SENOKOT-S) 8.6-50 MG tablet Take 2 tablets by mouth 2 (two) times daily. 03/27/17   Roxan Hockey, MD  temazepam (RESTORIL) 15 MG capsule Take 1 capsule (15 mg total) by mouth at bedtime. 03/27/17    Roxan Hockey, MD    Family History Family History  Problem Relation Age of Onset  . Tuberculosis Maternal Grandmother   . Heart attack Neg Hx   . Diabetes Neg Hx   . CAD Neg Hx   . Cancer Neg Hx     Social History Social History   Tobacco Use  . Smoking status: Former Smoker    Packs/day: 2.00    Years: 25.00    Pack years: 50.00    Types: Cigarettes    Last attempt to quit: 07/08/1976    Years since quitting: 41.1  . Smokeless tobacco: Never Used  Substance Use Topics  . Alcohol use: No    Comment: in AA since 1977, no alcohol since 1977  . Drug use: No    Types: Marijuana    Comment: marijuana use 37 years ago     Allergies   Penicillins; Antihistamines, loratadine-type; and Other   Review of Systems Review of Systems  Constitutional:       Gained weight since leaving the nursing home in march, 25 lbs     Physical Exam Updated Vital Signs BP 121/60   Pulse 83   Temp 98.8 F (37.1 C) (Oral)   Resp 18   Ht 1.753 m (5\' 9" )   Wt 108.9 kg (240 lb)   SpO2 98%   BMI 35.44 kg/m   Physical Exam  Constitutional: He appears well-developed and well-nourished. No distress.  HENT:  Head: Normocephalic and atraumatic.  Right Ear: External ear normal.  Left Ear: External ear normal.  Eyes: Conjunctivae are normal. Right eye exhibits no discharge. Left eye exhibits no discharge. No scleral icterus.  Neck: Neck supple. No tracheal deviation present.  Cardiovascular: Normal rate, regular rhythm and intact distal pulses.  Pulmonary/Chest: Effort normal and breath sounds normal. No stridor. No respiratory distress. He has no wheezes. He has no rales.  Abdominal: Soft. Bowel sounds are normal. He exhibits no distension. There is no tenderness. There is no rebound and no guarding.  Musculoskeletal: He exhibits no edema or tenderness.  Neurological: He is alert. He has normal strength. No cranial nerve deficit (no facial droop, extraocular movements intact, no  slurred speech) or sensory deficit. He exhibits normal muscle tone. He displays no seizure activity.  Coordination normal.  Skin: Skin is warm and dry. No rash noted.  Psychiatric: He has a normal mood and affect.  Nursing note and vitals reviewed.    ED Treatments / Results  Labs (all labs ordered are listed, but only abnormal results are displayed) Labs Reviewed  BASIC METABOLIC PANEL - Abnormal; Notable for the following components:      Result Value   Glucose, Bld 186 (*)    BUN 26 (*)    Creatinine, Ser 1.76 (*)    GFR calc non Af Amer 34 (*)    GFR calc Af Amer 40 (*)    All other components within normal limits  CBC - Abnormal; Notable for the following components:   RBC 2.51 (*)    Hemoglobin 7.3 (*)    HCT 24.2 (*)    RDW 18.7 (*)    All other components within normal limits  POC OCCULT BLOOD, ED - Abnormal; Notable for the following components:   Fecal Occult Bld POSITIVE (*)    All other components within normal limits  PROTIME-INR  TYPE AND SCREEN  PREPARE RBC (CROSSMATCH)    EKG None  Radiology No results found.  Procedures .Critical Care Performed by: Dorie Rank, MD Authorized by: Dorie Rank, MD   Critical care provider statement:    Critical care time (minutes):  30   Critical care was time spent personally by me on the following activities:  Discussions with consultants, evaluation of patient's response to treatment, examination of patient, ordering and performing treatments and interventions, ordering and review of laboratory studies, ordering and review of radiographic studies, pulse oximetry, re-evaluation of patient's condition, obtaining history from patient or surrogate and review of old charts   (including critical care time)  Medications Ordered in ED Medications  0.9 %  sodium chloride infusion (has no administration in time range)     Initial Impression / Assessment and Plan / ED Course  I have reviewed the triage vital signs and the  nursing notes.  Pertinent labs & imaging results that were available during my care of the patient were reviewed by me and considered in my medical decision making (see chart for details).  Clinical Course as of Aug 31 1954  Tue Aug 30, 2017  1903 Down from 8.8, 5 months ago  CBC(!) [JK]  1903 Increase in cr  Basic metabolic panel(!) [JK]  1660 Patient stools are Hemoccult positive   [JK]    Clinical Course User Index [JK] Dorie Rank, MD    Patient presented to the emergency room for evaluation of anemia in the setting of a story of GI bleeds.  She is Hemoccult positive.  His hemoglobin is down to 7.3 and he is having symptoms.  I have ordered blood transfusions.  I will consult with the medical service for admission and contact gastroenterology. Final Clinical Impressions(s) / ED Diagnoses   Final diagnoses:  Symptomatic anemia  Gastrointestinal hemorrhage, unspecified gastrointestinal hemorrhage type      Dorie Rank, MD 08/30/17 1956  Discussed with Dr Oletta Lamas.  Will consult on patient.    Dorie Rank, MD 08/30/17 2055

## 2017-08-30 NOTE — ED Notes (Signed)
ED TO INPATIENT HANDOFF REPORT  Name/Age/Gender Joel [name] 84 y.o. male  Code Status    Code Status Orders  (From admission, onward)        Start     Ordered   08/30/17 2101  Full code  Continuous     08/30/17 2103    Code Status History    Date Active Date Inactive Code Status Order ID Comments User Context   03/22/2017 2359 03/27/2017 2102 Full Code [id number]  Etta Quill, DO ED   05/15/2016 0029 05/18/2016 1737 Full Code [id number]  Reubin Milan, MD Inpatient   04/12/2016 1549 04/14/2016 1743 Full Code [id number]  Samella Parr, NP Inpatient   02/27/2016 0222 03/04/2016 1530 Full Code [id number]  Reubin Milan, MD ED   01/27/2016 2244 01/28/2016 1857 Full Code [id number]  Etta Quill, DO ED   12/16/2015 1519 12/18/2015 1920 Full Code [id number]  Fanny Skates, MD Inpatient   12/16/2015 1253 12/16/2015 1519 Full Code [id number]  Jerrye Beavers, PA-C ED   06/29/2015 0306 06/30/2015 1334 DNR [id number]  Toy Baker, MD ED   10/22/2014 2155 10/23/2014 1957 Full Code [id number]  Etta Quill, DO ED   08/04/2013 1755 08/06/2013 0058 DNR [id number]  Thurnell Lose, MD Inpatient    Advance Directive Documentation     Most Recent Value  Type of Advance Directive  Healthcare Power of Attorney, Living will  Pre-existing out of facility DNR order (yellow form or pink MOST form)  -  "MOST" Form in Place?  -      Home/SNF/Other Home  Chief Complaint abnormal labs   Level of Care/Admitting Diagnosis ED Disposition    ED Disposition Condition [place]: [facility] [100102]  Level of Care: Med-Surg [16]  Diagnosis: Blood loss anemia [920100]  Admitting Physician: Rosemarie Beath [facility] [[id number]]  Attending Physician: [facility], [facility] [[id number]]  Estimated length of stay: past midnight tomorrow  Certification:: I certify this patient will need inpatient services for at least 2 midnights  PT Class (Do Not  Modify): Inpatient [101]  PT Acc Code (Do Not Modify): Private [1]       Medical History Past Medical History:  Diagnosis Date  . Allergic rhinitis   . Anemia   . Anxiety   . Aortic stenosis 2012   mild to mod   . Atrial fibrillation ([place]) 1991   with multiple DCCV  . Bipolar affective disorder ([place])   . CHF (congestive heart failure) ([place]) 08/30/2017  . COPD (chronic obstructive pulmonary disease) ([place])   . Diabetic neuropathy ([place])    in feet  . Diabetic neuropathy ([place])   . Dyslipidemia   . Fatigue    last year or so  . Gout   . Hypertension   . Insomnia   . Obesity   . On home oxygen therapy 2 1/2 liters with bipap at night  . OSA (obstructive sleep apnea)    severe, uses bipap 16 1/2 by 12 or 13 setting  . Prostate cancer ([place])   . Rectal bleeding 12/16/2015  . Type 2 diabetes mellitus ([place])   . Vertigo     Allergies Allergies  Allergen Reactions  . Penicillins Anaphylaxis    Has patient had a PCN reaction causing immediate rash, facial/tongue/throat swelling, SOB or lightheadedness with hypotension: unknown Has patient had a PCN reaction causing severe rash involving mucus membranes or skin necrosis: unknown Has patient had a PCN reaction  that required hospitalization: unknown Has patient had a PCN reaction occurring within the last 10 years: no If all of the above answers are "NO", then may proceed with Cephalosporin use.   [place] Kitchen Antihistamines, Loratadine-Type Other (See Comments)    depression  . Other     Beta blockers-Novacaine  Caused depression    IV Location/Drains/Wounds Patient Lines/Drains/Airways Status   Active Line/Drains/Airways    Name:   Placement date:   Placement time:   Site:   Days:   Peripheral IV 08/30/17 Left Antecubital   08/30/17    1857    Antecubital   less than 1   Peripheral IV 08/30/17 Right Forearm   08/30/17    1857    Forearm   less than 1   Incision (Closed) 03/23/17 Thigh Right   03/23/17    1431     160           Labs/Imaging Results for orders placed or performed during the hospital encounter of 08/30/17 (from the past 48 hour(s))  Basic metabolic panel     Status: Abnormal   Collection Time: 08/30/17  5:44 PM  Result Value Ref Range   Sodium 141 135 - 145 mmol/L   Potassium 4.4 3.5 - 5.1 mmol/L   Chloride 106 98 - 111 mmol/L   CO2 26 22 - 32 mmol/L   Glucose, Bld 186 (H) 70 - 99 mg/dL   BUN 26 (H) 8 - 23 mg/dL   Creatinine, Ser 1.76 (H) 0.61 - 1.24 mg/dL   Calcium 9.3 8.9 - 10.3 mg/dL   GFR calc non Af Amer 34 (L) >60 mL/min   GFR calc Af Amer 40 (L) >60 mL/min    Comment: (NOTE) The eGFR has been calculated using the CKD EPI equation. This calculation has not been validated in all clinical situations. eGFR's persistently <60 mL/min signify possible Chronic Kidney Disease.    Anion gap 9 5 - 15    Comment: Performed at [facility], [place] [address] [place], [place] [address]  CBC     Status: Abnormal   Collection Time: 08/30/17  5:44 PM  Result Value Ref Range   WBC 5.1 4.0 - 10.5 K/uL   RBC 2.51 (L) 4.22 - 5.81 MIL/uL   Hemoglobin 7.3 (L) 13.0 - 17.0 g/dL   HCT 24.2 (L) 39.0 - 52.0 %   MCV 96.4 78.0 - 100.0 fL   MCH 29.1 26.0 - 34.0 pg   MCHC 30.2 30.0 - 36.0 g/dL   RDW 18.7 (H) 11.5 - 15.5 %   Platelets 196 150 - 400 K/uL    Comment: Performed at [facility], [place] [address], [place], [place] [address]  Type and screen [place]     Status: None (Preliminary result)   Collection Time: 08/30/17  6:56 PM  Result Value Ref Range   ABO/RH(D) O POS    Antibody Screen NEG    Sample Expiration 09/02/2017    Unit Number [id number]    Blood Component Type RED CELLS,LR    Unit division 00    Status of Unit ALLOCATED    Transfusion Status OK TO TRANSFUSE    Crossmatch Result Compatible    Unit Number [id number]    Blood Component Type RED CELLS,LR    Unit division 00    Status of Unit ISSUED    Transfusion  Status OK TO TRANSFUSE    Crossmatch Result      Compatible Performed  at [facility], [place] [address] [place], [place] [address]   POC occult blood, ED     Status: Abnormal   Collection Time: 08/30/17  7:36 PM  Result Value Ref Range   Fecal Occult Bld POSITIVE (A) NEGATIVE  Prepare RBC     Status: None   Collection Time: 08/30/17  7:54 PM  Result Value Ref Range   Order Confirmation      ORDER PROCESSED BY BLOOD BANK Performed at [facility] [address], [place], [place] [address]   Protime-INR     Status: None   Collection Time: 08/30/17  9:35 PM  Result Value Ref Range   Prothrombin Time 13.9 11.4 - 15.2 seconds   INR 1.07     Comment: Performed at [facility], [place] [address], [place], [place] [address]  CBG monitoring, ED     Status: Abnormal   Collection Time: 08/30/17  9:49 PM  Result Value Ref Range   Glucose-Capillary 115 (H) 70 - 99 mg/dL   No results found.  Pending Labs Unresulted Labs (From admission, onward)   Start     Ordered   08/31/17 4008  Basic metabolic panel  Tomorrow morning,   R     08/30/17 2103   08/31/17 0500  CBC  Tomorrow morning,   R     08/30/17 2103   08/30/17 2359  Hemoglobin  Now then every 8 hours,   R     08/30/17 2103   08/30/17 2121  Hemoglobin A1c  Once,   R    Comments:  To assess prior glycemic control    08/30/17 2120      Vitals/Pain Today's Vitals   08/30/17 2045 08/30/17 2100 08/30/17 2129 08/30/17 2142  BP: (!) 133/109 123/80 137/68 (!) 159/70  Pulse: 87 97 84 91  Resp: (!) 21 (!) 23 20 17   Temp:   97.8 F (36.6 C) 97.8 F (36.6 C)  TempSrc:   Oral Oral  SpO2: 94% 93% 95% 95%  Weight:      Height:      PainSc:        Isolation Precautions No active isolations  Medications Medications  0.9 %  sodium chloride infusion (has no administration in time range)  acetaminophen (TYLENOL) tablet 650 mg (has no administration in time range)    Or   acetaminophen (TYLENOL) suppository 650 mg (has no administration in time range)  ondansetron (ZOFRAN) tablet 4 mg (has no administration in time range)    Or  ondansetron (ZOFRAN) injection 4 mg (has no administration in time range)  insulin aspart (novoLOG) injection 0-15 Units (0 Units Subcutaneous Not Given 08/30/17 2149)    Mobility walks with person assist

## 2017-08-30 NOTE — ED Triage Notes (Addendum)
Pt was seen at PCP for bone density test and was sent here for low hemoglobin of 7.4. New diagnosis of CHF today at PCP. Gained 25lb since March 2019. Denies SOB and chest pain.

## 2017-08-30 NOTE — ED Notes (Signed)
Sent down a blue top for PT/INR since lab reported they do not have the tube I drew when starting his IV's.

## 2017-08-30 NOTE — ED Notes (Signed)
Provided patient a [place] sandwich and water based on available food in the emergency and diet orders.

## 2017-08-30 NOTE — ED Notes (Signed)
Gave report to TG for patient.

## 2017-08-30 NOTE — H&P (Signed)
History and Physical  Joel [name] GHW:[id number] DOB: 11/17/1932 DOA: 08/30/2017   PCP: Wenda Low, MD   Patient coming from: Home with son   Chief Complaint: weakness, told he was anemic   HPI: Joel [name] is a 84 y.o. male with medical history significant for CAD, Afib not on anticoagulation due to history of recurrent GI bleed felt to be from small gastric AVMs causing iron deficiency anemia. He required several transfusions last year, but has since been doing well. He has been following up with hematology but has not followed up there since March 2019 it seems. The plan was for him to follow up monthlly with transfusions to keep Hb above 9. He has been doing well, but in the last 2-3 weeks he noted some abdominal bloating as well as weaness. Yesterday he want for a bone scan and asked that his hemoglobin be checked, he was called to come to ER today due to Hb of 7. Denies nausea, abdominal pain, reflux, or BRBPR. He has very dark stools but he takes iron BID.  He has had some SOB with exertion, but no chest pain, dizziness or syncope.  ED Course: In the ER repeat lab confirmed Hb 7, two units of blood have been ordered and Hemoccult was positive for blood. Hospitalist was asked to admit, and a call has been placed to GI for inpatient consultation.  Review of Systems: Please see HPI for pertinent positives and negatives. A complete 10 system review of systems are otherwise negative.  Past Medical History:  Diagnosis Date  . Allergic rhinitis   . Anemia   . Anxiety   . Aortic stenosis 2012   mild to mod   . Atrial fibrillation ([place]) 1991   with multiple DCCV  . Bipolar affective disorder ([place])   . CHF (congestive heart failure) ([place]) 08/30/2017  . COPD (chronic obstructive pulmonary disease) ([place])   . Diabetic neuropathy ([place])    in feet  . Diabetic neuropathy ([place])   . Dyslipidemia   . Fatigue    last year or so  . Gout   . Hypertension   . Insomnia   . Obesity   .  On home oxygen therapy 2 1/2 liters with bipap at night  . OSA (obstructive sleep apnea)    severe, uses bipap 16 1/2 by 12 or 13 setting  . Prostate cancer ([place])   . Rectal bleeding 12/16/2015  . Type 2 diabetes mellitus ([place])   . Vertigo    Past Surgical History:  Procedure Laterality Date  . CARDIOVERSION  yrs ago   prior to 1998  . COLONOSCOPY WITH PROPOFOL N/A 01/23/2013   Procedure: COLONOSCOPY WITH PROPOFOL;  Surgeon: Garlan Fair, MD;  Location: [facility] END[facility]COPY;  Service: Endoscopy;  Laterality: N/A;  . electro shock  1969   for depression  . ENTEROSCOPY Left 05/17/2016   Procedure: ENTEROSCOPY;  Surgeon: Wilford Corner, MD;  Location: [facility]COPY;  Service: Endoscopy;  Laterality: Left;  . ESOPHAGOGASTRODUODENOSCOPY N/A 03/03/2016   Procedure: ESOPHAGOGASTRODUODENOSCOPY (EGD);  Surgeon: Teena Irani, MD;  Location: [facility];  Service: Endoscopy;  Laterality: N/A;  . ESOPHAGOGASTRODUODENOSCOPY N/A 03/26/2016   Procedure: ESOPHAGOGASTRODUODENOSCOPY (EGD);  Surgeon: Teena Irani, MD;  Location[facility]COPY;  Service: Endoscopy;  Laterality: N/A;  would like to use ultraslim scope  . ESOPHAGOGASTRODUODENOSCOPY (EGD) WITH PROPOFOL N/A 01/23/2013   Procedure: ESOPHAGOGASTRODUODENOSCOPY (EGD) WITH PROPOFOL;  Surgeon: Garlan Fair, MD;  Location: [facility];  Service: Endoscopy;  Laterality: N/A;  .  ESOPHAGOGASTRODUODENOSCOPY (EGD) WITH PROPOFOL N/A 04/13/2016   Procedure: ESOPHAGOGASTRODUODENOSCOPY (EGD) WITH PROPOFOL;  Surgeon: Otis Brace, MD;  Location: [place];  Service: Gastroenterology;  Laterality: N/A;  . GIVENS CAPSULE STUDY N/A 03/03/2016   Procedure: GIVENS CAPSULE STUDY;  Surgeon: Teena Irani, MD;  Location: [facility]OS[facility];  Service: Endoscopy;  Laterality: N/A;  . HEMORRHOID SURGERY N/A 12/16/2015   Procedure: PROCTOSCOPY WITH CONTROL OF BLEEDING;  Surgeon: Fanny Skates, MD;  Location: [facility];  Service: General;  Laterality: N/A;  . HIP PINNING,CANNULATED  Right 03/23/2017   Procedure: CANNULATED HIP PINNING;  Surgeon: Marybelle Killings, MD;  Location: [facility] OR[facility];  Service: Orthopedics;  Laterality: Right;  . KNEE SURGERY  1970's   rt  . MAZE  1998  . PROSTATE BIOPSY    . SHOULDER SURGERY  7-8 yrs ago   rt    Social History:  reports that he quit smoking about 41 years ago. His smoking use included cigarettes. He has a 50.00 pack-year smoking history. He has never used smokeless tobacco. He reports that he does not drink alcohol or use drugs.   Allergies  Allergen Reactions  . Penicillins Anaphylaxis    Has patient had a PCN reaction causing immediate rash, facial/tongue/throat swelling, SOB or lightheadedness with hypotension: unknown Has patient had a PCN reaction causing severe rash involving mucus membranes or skin necrosis: unknown Has patient had a PCN reaction that required hospitalization: unknown Has patient had a PCN reaction occurring within the last 10 years: no If all of the above answers are "NO", then may proceed with Cephalosporin use.   Marland Kitchen Antihistamines, Loratadine-Type Other (See Comments)    depression  . Other     Beta blockers-Novacaine  Caused depression    Family History  Problem Relation Age of Onset  . Tuberculosis Maternal Grandmother   . Heart attack Neg Hx   . Diabetes Neg Hx   . CAD Neg Hx   . Cancer Neg Hx      Prior to Admission medications   Medication Sig Start Date End Date Taking? Authorizing Provider  allopurinol (ZYLOPRIM) 100 MG tablet Take 100 mg by mouth daily.    Yes [provider]  Ascorbic Acid (VITAMIN C PO) Take 1 tablet by mouth daily.   Yes [provider]  buPROPion (WELLBUTRIN XL) 150 MG 24 hr tablet Take 1 tablet (150 mg total) by mouth daily. 12/18/15  Yes Ambrose Finland, MD  cholecalciferol (VITAMIN D) 1000 units tablet Take 1,000 Units by mouth daily.   Yes [provider]  citalopram (CELEXA) 40 MG tablet Take 40 mg by mouth daily.    Yes  [provider]  Cyanocobalamin (VITAMIN B-12 PO) Take 1 tablet by mouth daily.   Yes [provider]  dextromethorphan-guaiFENesin (MUCINEX DM) 30-600 MG 12hr tablet Take 1 tablet by mouth 2 (two) times daily.   Yes [provider]  diltiazem (CARDIZEM SR) 120 MG 12 hr capsule Take 120 mg by mouth daily.  03/18/17  Yes [provider]  enoxaparin (LOVENOX) 30 MG/0.3ML injection Inject 0.3 mLs (30 mg total) into the skin daily. 03/26/17  Yes Lanae Crumbly, PA-C  fluticasone [facility]) 50 MCG/ACT nasal spray Place 2 sprays into both nostrils daily. Patient taking differently: Place 2 sprays into both nostrils daily as needed for allergies or rhinitis.  01/27/16  Yes Waynetta Pean, PA-C  furosemide (LASIX) 40 MG tablet Take 1 tablet (40 mg total) by mouth daily. Patient taking differently: Take 80 mg  by mouth daily.  03/27/17  Yes Roxan Hockey, MD  glimepiride (AMARYL) 1 MG tablet Take 1 mg by mouth daily with breakfast.  03/01/17  Yes [provider]  iron polysaccharides (NIFEREX) 150 MG capsule Take 1 capsule (150 mg total) by mouth 2 (two) times daily. 10/23/14  Yes Eugenie Filler, MD  losartan (COZAAR) 25 MG tablet Take 1 tablet (25 mg total) by mouth daily. 03/30/17  Yes Emokpae, Courage, MD  lovastatin (MEVACOR) 20 MG tablet Take 20 mg by mouth at bedtime.    Yes [provider]  meclizine (ANTIVERT) 25 MG tablet Take 1 tablet (25 mg total) by mouth 3 (three) times daily as needed for dizziness. Patient taking differently: Take 25 mg by mouth 2 (two) times daily.  01/28/16  Yes Eber Jones, MD  metFORMIN (GLUCOPHAGE) 1000 MG tablet Take 1,000 mg by mouth 2 (two) times daily.  05/17/14  Yes [provider]  Multiple Vitamins-Minerals (VISION FORMULA PO) Take 1 tablet by mouth 2 (two) times daily.   Yes [provider]  omeprazole (PRILOSEC) 20 MG capsule Take 1 capsule (20 mg total) by mouth 2 (two) times daily.  03/27/17 03/27/18 Yes Emokpae, Courage, MD  OXYGEN Inhale 2 L into the lungs as needed (shortness of breath).    Yes [provider]  potassium chloride SA (K-DUR,KLOR-CON) 20 MEQ tablet Take 20 mEq by mouth daily.  09/10/14  Yes [provider]  senna-docusate (SENOKOT-S) 8.6-50 MG tablet Take 2 tablets by mouth 2 (two) times daily. 03/27/17  Yes Emokpae, Courage, MD  temazepam (RESTORIL) 15 MG capsule Take 1 capsule (15 mg total) by mouth at bedtime. 03/27/17  Yes Roxan Hockey, MD    Physical Exam: BP 123/80   Pulse 97   Temp 98.8 F (37.1 C) (Oral)   Resp (!) 23   Ht 5\' 9"  (1.753 m)   Wt 108.9 kg (240 lb)   SpO2 93%   BMI 35.44 kg/m   General:  Alert, oriented, calm, in no acute distress, appears a little pale and younger than his stated age  Eyes: EOMI, clear conjuctivae, white sclerea Neck: supple, no masses, trachea mildline  Cardiovascular: RRR, no murmurs or rubs, no peripheral edema  Respiratory: clear to auscultation bilaterally, no wheezes, no crackles  Abdomen: soft, nontender, distended, normal bowel tones heard  Skin: dry, warm, some redness and warmth to bilateral lower legs Musculoskeletal: no joint effusions, normal range of motion  Psychiatric: appropriate affect, normal speech  Neurologic: extraocular muscles intact, clear speech, moving all extremities with intact sensorium            Labs on Admission:  Basic Metabolic Panel: Recent Labs  Lab 08/30/17 1744  NA 141  K 4.4  CL 106  CO2 26  GLUCOSE 186*  BUN 26*  CREATININE 1.76*  CALCIUM 9.3   Liver Function Tests: No results for input(s): AST, ALT, ALKPHOS, BILITOT, PROT, ALBUMIN in the last 168 hours. No results for input(s): LIPASE, AMYLASE in the last 168 hours. No results for input(s): AMMONIA in the last 168 hours. CBC: Recent Labs  Lab 08/30/17 1744  WBC 5.1  HGB 7.3*  HCT 24.2*  MCV 96.4  PLT 196   Cardiac Enzymes: No results for input(s): CKTOTAL, CKMB,  CKMBINDEX, TROPONINI in the last 168 hours.  BNP (last 3 results) No results for input(s): BNP in the last 8760 hours.  ProBNP (last 3 results) No results for input(s): PROBNP in the last 8760 hours.  CBG: No results for input(s): GLUCAP in the last 168 hours.  Radiological Exams on Admission: No results found.   Assessment/Plan Present on Admission: . Blood loss anemia . Hypertension . Chronic GI bleeding . Persistent atrial fibrillation ([place]) . Pulmonary hypertension ([place])  Pleasant 84 yo caucasian male with history of Afib not on AC due to chronic GI bleeding with chronic blood loss anemia who is being admitted with acute on chronic blood loss anemia. - inpatient admission - cardiac monitoring - transfuse two units pRBC - check Hb Q8 hours - ER MD consulting [facility] for further recommendations - NPO at midnight in case plan is for upper endoscopy, though he has had prior thorough workup    DM2 (diabetes mellitus, type 2) ([place]) - check A1c, place on SSI, continue home metformin   Pulmonary hypertension ([place])   Hypertension   Chronic GI bleeding   Persistent atrial fibrillation ([place]) - continue home cardizem, not on AC due to hx of GI bleeding   Blood loss anemia  DVT prophylaxis: SCDs   Code Status: FULL   Family Communication: None present   Disposition Plan: Likely home at [place] called: GI   Admission status: Inpatient   Time spent: 39 minutes  Joel Cain Marry Guan MD [facility] Pager [phone]  If 7PM-7AM, please contact night-coverage [email or web address] Password [email or web address]  08/30/2017, 9:04 PM

## 2017-08-30 NOTE — ED Notes (Signed)
RN made aware that blood is ready.

## 2017-08-30 NOTE — ED Notes (Signed)
Bed: EB34 Expected date:  Expected time:  Means of arrival:  Comments: Room 9

## 2017-08-31 ENCOUNTER — Inpatient Hospital Stay (HOSPITAL_COMMUNITY): Payer: PPO | Admitting: Anesthesiology

## 2017-08-31 ENCOUNTER — Encounter (HOSPITAL_COMMUNITY): Payer: Self-pay | Admitting: Anesthesiology

## 2017-08-31 ENCOUNTER — Encounter (HOSPITAL_COMMUNITY)
Admission: EM | Disposition: A | Discharge status: Home Health | Payer: Self-pay | Source: Home / Self Care | Attending: Internal Medicine

## 2017-08-31 DIAGNOSIS — I272 Pulmonary hypertension, unspecified: Secondary | ICD-10-CM

## 2017-08-31 DIAGNOSIS — E119 Type 2 diabetes mellitus without complications: Secondary | ICD-10-CM

## 2017-08-31 DIAGNOSIS — K922 Gastrointestinal hemorrhage, unspecified: Secondary | ICD-10-CM

## 2017-08-31 HISTORY — PX: ESOPHAGOGASTRODUODENOSCOPY: SHX5428

## 2017-08-31 LAB — BASIC METABOLIC PANEL
ANION GAP: 10 (ref 5–15)
BUN: 25 mg/dL — ABNORMAL HIGH (ref 8–23)
CALCIUM: 9.4 mg/dL (ref 8.9–10.3)
CO2: 25 mmol/L (ref 22–32)
Chloride: 107 mmol/L (ref 98–111)
Creatinine, Ser: 1.4 mg/dL — ABNORMAL HIGH (ref 0.61–1.24)
GFR calc Af Amer: 52 mL/min — ABNORMAL LOW (ref >60)
GFR, EST NON AFRICAN AMERICAN: 45 mL/min — AB (ref >60)
GLUCOSE: 141 mg/dL — AB (ref 70–99)
Potassium: 4.5 mmol/L (ref 3.5–5.1)
Sodium: 142 mmol/L (ref 135–145)

## 2017-08-31 LAB — CBC
HCT: 27.9 % — ABNORMAL LOW (ref 39.0–52.0)
HEMOGLOBIN: 8.9 g/dL — AB (ref 13.0–17.0)
MCH: 30.1 pg (ref 26.0–34.0)
MCHC: 31.9 g/dL (ref 30.0–36.0)
MCV: 94.3 fL (ref 78.0–100.0)
Platelets: 168 10*3/uL (ref 150–400)
RBC: 2.96 MIL/uL — ABNORMAL LOW (ref 4.22–5.81)
RDW: 17.6 % — AB (ref 11.5–15.5)
WBC: 5.6 10*3/uL (ref 4.0–10.5)

## 2017-08-31 LAB — GLUCOSE, CAPILLARY
GLUCOSE-CAPILLARY: 142 mg/dL — AB (ref 70–99)
GLUCOSE-CAPILLARY: 208 mg/dL — AB (ref 70–99)
Glucose-Capillary: 120 mg/dL — ABNORMAL HIGH (ref 70–99)
Glucose-Capillary: 135 mg/dL — ABNORMAL HIGH (ref 70–99)
Glucose-Capillary: 142 mg/dL — ABNORMAL HIGH (ref 70–99)
Glucose-Capillary: 162 mg/dL — ABNORMAL HIGH (ref 70–99)
Glucose-Capillary: 174 mg/dL — ABNORMAL HIGH (ref 70–99)

## 2017-08-31 LAB — HEMOGLOBIN AND HEMATOCRIT, BLOOD
HCT: 29.2 % — ABNORMAL LOW (ref 39.0–52.0)
Hemoglobin: 9 g/dL — ABNORMAL LOW (ref 13.0–17.0)

## 2017-08-31 LAB — HEMOGLOBIN A1C
Hgb A1c MFr Bld: 6 % — ABNORMAL HIGH (ref 4.8–5.6)
MEAN PLASMA GLUCOSE: 125.5 mg/dL

## 2017-08-31 LAB — HEMOGLOBIN: HEMOGLOBIN: 8.9 g/dL — AB (ref 13.0–17.0)

## 2017-08-31 SURGERY — EGD (ESOPHAGOGASTRODUODENOSCOPY)
Anesthesia: Monitor Anesthesia Care

## 2017-08-31 MED ORDER — MECLIZINE HCL 25 MG PO TABS
25.0000 mg | ORAL_TABLET | Freq: Three times a day (TID) | ORAL | Status: DC | PRN
  Filled 2017-08-31: qty 1

## 2017-08-31 MED ORDER — PRAVASTATIN SODIUM 20 MG PO TABS
20.0000 mg | ORAL_TABLET | Freq: Every day | ORAL | Status: DC
  Administered 2017-08-31 – 2017-09-15 (×14): 20 mg via ORAL
  Filled 2017-08-31 (×15): qty 1

## 2017-08-31 MED ORDER — FLUTICASONE PROPIONATE 50 MCG/ACT NA SUSP
2.0000 | Freq: Every day | NASAL | Status: DC | PRN
  Filled 2017-08-31: qty 16

## 2017-08-31 MED ORDER — PANTOPRAZOLE SODIUM 40 MG PO TBEC
40.0000 mg | DELAYED_RELEASE_TABLET | Freq: Two times a day (BID) | ORAL | Status: DC
  Administered 2017-08-31 – 2017-09-16 (×29): 40 mg via ORAL
  Filled 2017-08-31 (×29): qty 1

## 2017-08-31 MED ORDER — FLUCONAZOLE 100 MG PO TABS
100.0000 mg | ORAL_TABLET | Freq: Every day | ORAL | Status: AC
  Administered 2017-08-31 – 2017-09-02 (×3): 100 mg via ORAL
  Filled 2017-08-31 (×3): qty 1

## 2017-08-31 MED ORDER — CITALOPRAM HYDROBROMIDE 20 MG PO TABS
20.0000 mg | ORAL_TABLET | Freq: Every day | ORAL | Status: DC
  Administered 2017-09-01 – 2017-09-13 (×12): 20 mg via ORAL
  Filled 2017-08-31 (×13): qty 1

## 2017-08-31 MED ORDER — VITAMIN D3 25 MCG (1000 UNIT) PO TABS
1000.0000 [IU] | ORAL_TABLET | Freq: Every day | ORAL | Status: DC
  Administered 2017-08-31 – 2017-09-04 (×5): 1000 [IU] via ORAL
  Filled 2017-08-31 (×5): qty 1

## 2017-08-31 MED ORDER — PROPOFOL 500 MG/50ML IV EMUL
INTRAVENOUS | Status: DC | PRN
  Administered 2017-08-31: 75 ug/kg/min via INTRAVENOUS

## 2017-08-31 MED ORDER — BUPROPION HCL ER (XL) 150 MG PO TB24
150.0000 mg | ORAL_TABLET | Freq: Every day | ORAL | Status: DC
  Administered 2017-08-31 – 2017-09-13 (×13): 150 mg via ORAL
  Filled 2017-08-31 (×13): qty 1

## 2017-08-31 MED ORDER — POTASSIUM CHLORIDE CRYS ER 20 MEQ PO TBCR
20.0000 meq | EXTENDED_RELEASE_TABLET | Freq: Every day | ORAL | Status: DC
Start: 2017-08-31 — End: 2017-09-09
  Administered 2017-08-31 – 2017-09-08 (×7): 20 meq via ORAL
  Filled 2017-08-31 (×8): qty 1

## 2017-08-31 MED ORDER — POLYSACCHARIDE IRON COMPLEX 150 MG PO CAPS
150.0000 mg | ORAL_CAPSULE | Freq: Two times a day (BID) | ORAL | Status: DC
  Administered 2017-08-31 – 2017-09-16 (×28): 150 mg via ORAL
  Filled 2017-08-31 (×34): qty 1

## 2017-08-31 MED ORDER — INSULIN ASPART 100 UNIT/ML ~~LOC~~ SOLN: 0.0000 [IU] | Freq: Every day | SUBCUTANEOUS | Status: DC

## 2017-08-31 MED ORDER — DM-GUAIFENESIN ER 30-600 MG PO TB12
1.0000 | ORAL_TABLET | Freq: Two times a day (BID) | ORAL | Status: DC
  Administered 2017-08-31 – 2017-09-01 (×4): 1 via ORAL
  Filled 2017-08-31 (×4): qty 1

## 2017-08-31 MED ORDER — FUROSEMIDE 40 MG PO TABS
40.0000 mg | ORAL_TABLET | Freq: Every day | ORAL | Status: DC
  Administered 2017-08-31 – 2017-09-02 (×3): 40 mg via ORAL
  Filled 2017-08-31 (×3): qty 1

## 2017-08-31 MED ORDER — TEMAZEPAM 15 MG PO CAPS
15.0000 mg | ORAL_CAPSULE | Freq: Every day | ORAL | Status: DC
  Administered 2017-08-31 – 2017-09-08 (×10): 15 mg via ORAL
  Filled 2017-08-31 (×11): qty 1

## 2017-08-31 MED ORDER — DICLOFENAC SODIUM 1 % TD GEL
4.0000 g | Freq: Four times a day (QID) | TRANSDERMAL | Status: DC
  Administered 2017-08-31 – 2017-09-04 (×14): 4 g via TOPICAL
  Filled 2017-08-31: qty 100

## 2017-08-31 MED ORDER — ALLOPURINOL 100 MG PO TABS
100.0000 mg | ORAL_TABLET | Freq: Every day | ORAL | Status: DC
  Administered 2017-08-31 – 2017-09-04 (×5): 100 mg via ORAL
  Filled 2017-08-31 (×5): qty 1

## 2017-08-31 MED ORDER — PHENYLEPHRINE HCL 10 MG/ML IJ SOLN
INTRAMUSCULAR | Status: DC | PRN
  Administered 2017-08-31: 40 ug via INTRAVENOUS

## 2017-08-31 MED ORDER — INSULIN ASPART 100 UNIT/ML ~~LOC~~ SOLN
0.0000 [IU] | Freq: Three times a day (TID) | SUBCUTANEOUS | Status: DC
  Administered 2017-08-31: 2 [IU] via SUBCUTANEOUS
  Administered 2017-09-01 (×3): 3 [IU] via SUBCUTANEOUS
  Administered 2017-09-02 – 2017-09-04 (×3): 2 [IU] via SUBCUTANEOUS
  Administered 2017-09-04 – 2017-09-05 (×2): 3 [IU] via SUBCUTANEOUS
  Administered 2017-09-06: 2 [IU] via SUBCUTANEOUS
  Administered 2017-09-06 – 2017-09-07 (×2): 3 [IU] via SUBCUTANEOUS
  Administered 2017-09-07 – 2017-09-08 (×2): 2 [IU] via SUBCUTANEOUS
  Administered 2017-09-08: 3 [IU] via SUBCUTANEOUS
  Administered 2017-09-09: 2 [IU] via SUBCUTANEOUS
  Administered 2017-09-10: 3 [IU] via SUBCUTANEOUS
  Administered 2017-09-10: 2 [IU] via SUBCUTANEOUS
  Administered 2017-09-11 (×2): 3 [IU] via SUBCUTANEOUS
  Administered 2017-09-12: 2 [IU] via SUBCUTANEOUS
  Administered 2017-09-13: 3 [IU] via SUBCUTANEOUS
  Administered 2017-09-14: 2 [IU] via SUBCUTANEOUS
  Administered 2017-09-15: 3 [IU] via SUBCUTANEOUS
  Administered 2017-09-16: 2 [IU] via SUBCUTANEOUS
  Administered 2017-09-16: 3 [IU] via SUBCUTANEOUS

## 2017-08-31 MED ORDER — METFORMIN HCL 500 MG PO TABS: 1000.0000 mg | ORAL_TABLET | Freq: Two times a day (BID) | ORAL | Status: DC

## 2017-08-31 MED ORDER — SODIUM CHLORIDE 0.9 % IV SOLN: INTRAVENOUS | Status: DC

## 2017-08-31 MED ORDER — PANTOPRAZOLE SODIUM 40 MG PO TBEC
40.0000 mg | DELAYED_RELEASE_TABLET | Freq: Every day | ORAL | Status: DC
  Administered 2017-08-31: 40 mg via ORAL
  Filled 2017-08-31: qty 1

## 2017-08-31 MED ORDER — PROPOFOL 10 MG/ML IV BOLUS
INTRAVENOUS | Status: AC
  Filled 2017-08-31: qty 60

## 2017-08-31 MED ORDER — LACTATED RINGERS IV SOLN
INTRAVENOUS | Status: DC | PRN
  Administered 2017-08-31: 1000 mL
  Administered 2017-08-31: 08:00:00 via INTRAVENOUS

## 2017-08-31 MED ORDER — SENNOSIDES-DOCUSATE SODIUM 8.6-50 MG PO TABS
2.0000 | ORAL_TABLET | Freq: Two times a day (BID) | ORAL | Status: DC
  Administered 2017-08-31 – 2017-09-04 (×9): 2 via ORAL
  Filled 2017-08-31 (×10): qty 2

## 2017-08-31 MED ORDER — FUROSEMIDE 40 MG PO TABS: 40.0000 mg | ORAL_TABLET | Freq: Every day | ORAL | Status: DC

## 2017-08-31 MED ORDER — CITALOPRAM HYDROBROMIDE 20 MG PO TABS
40.0000 mg | ORAL_TABLET | Freq: Every day | ORAL | Status: DC
  Administered 2017-08-31: 40 mg via ORAL
  Filled 2017-08-31: qty 2

## 2017-08-31 MED ORDER — LOSARTAN POTASSIUM 50 MG PO TABS: 25.0000 mg | ORAL_TABLET | Freq: Every day | ORAL | Status: DC

## 2017-08-31 MED ORDER — PROPOFOL 500 MG/50ML IV EMUL
INTRAVENOUS | Status: DC | PRN
  Administered 2017-08-31: 20 mg via INTRAVENOUS

## 2017-08-31 MED ORDER — DILTIAZEM HCL ER COATED BEADS 120 MG PO CP24
120.0000 mg | ORAL_CAPSULE | Freq: Every day | ORAL | Status: DC
  Administered 2017-08-31 – 2017-09-16 (×17): 120 mg via ORAL
  Filled 2017-08-31 (×17): qty 1

## 2017-08-31 NOTE — Op Note (Signed)
[facility] Patient Name: Joel [name] Procedure Date: 08/31/2017 MRN: [id number] Attending MD: Nancy Fetter Dr., MD Date of Birth: 1932-06-05 CSN: [id number] Age: 84 Admit Type: Inpatient Procedure:                Upper GI endoscopy Indications:              Iron deficiency anemia secondary to chronic blood                            loss, Heme positive stool, patient has been taking                            Aleve on a regular basis for hip pain Providers:                Jeneen Rinks L. Akiyah Eppolito Dr., MD, Burtis Junes, RN, Alan Mulder, Technician, Arnoldo Hooker, CRNA Referring MD:              Medicines:                Monitored Anesthesia Care Complications:            No immediate complications. Estimated Blood Loss:     Estimated blood loss: none. Procedure:                Pre-Anesthesia Assessment:                           - Prior to the procedure, a History and Physical                            was performed, and patient medications and                            allergies were reviewed. The patient's tolerance of                            previous anesthesia was also reviewed. The risks                            and benefits of the procedure and the sedation                            options and risks were discussed with the patient.                            All questions were answered, and informed consent                            was obtained. Prior Anticoagulants: The patient has                            taken no previous anticoagulant or antiplatelet  agents. ASA Grade Assessment: III - A patient with                            severe systemic disease. After reviewing the risks                            and benefits, the patient was deemed in                            satisfactory condition to undergo the procedure.                           After obtaining informed consent, the endoscope was                      passed under direct vision. Throughout the                            procedure, the patient's blood pressure, pulse, and                            oxygen saturations were monitored continuously. The                            GIF-H190 ([id number] Olympus adult endoscope was                            introduced through the mouth, and advanced to the                            second part of duodenum. The upper GI endoscopy was                            accomplished without difficulty. The patient                            tolerated the procedure well. Scope In: Scope Out: Findings:      Diffuse, white plaques were found in the mid esophagus.      Diffuse moderate inflammation characterized by erosions, erythema and       friability was found in the entire examined stomach.      Localized mild inflammation characterized by erosions and erythema was       found in the duodenal bulb. Impression:               - Esophageal plaques were found, consistent with                            candidiasis. Due to the patient's history of                            diabetes and the fact that he had been bleeding                            this was not biopsy                           -  Gastritis.                           - Duodenitis.                           - No specimens collected. Moderate Sedation:      See anesthesia note, no moderate sedation. Recommendation:           - Return patient to hospital ward for ongoing care.                           - Use Protonix (pantoprazole) 40 mg PO BID.                           - Diflucan (fluconazole) 100 mg PO daily for 3 days. Procedure Code(s):        --- Professional ---                           [phone], Esophagogastroduodenoscopy, flexible,                            transoral; diagnostic, including collection of                            specimen(s) by brushing or washing, when performed                            (separate  procedure) Diagnosis Code(s):        --- Professional ---                           K29.70, Gastritis, unspecified, without bleeding                           K29.80, Duodenitis without bleeding                           D50.0, Iron deficiency anemia secondary to blood                            loss (chronic)                           R19.5, Other fecal abnormalities                           K22.9, Disease of esophagus, unspecified CPT copyright 2017 [employer]. All rights reserved. The codes documented in this report are preliminary and upon coder review may  be revised to meet current compliance requirements. Nancy Fetter Dr., MD 08/31/2017 8:44:10 AM This report has been signed electronically. Number of Addenda: 0

## 2017-08-31 NOTE — Consult Note (Signed)
[facility] CONSULT Reason for consult: [name] bleeding Referring Physician: Triad [name].  PCP: [name].  Primary [name]: Joel [name] is an 84 y.o. male.  HPI: He has a long history of chronic [name] bleeding.  His work-up is been extensive.  His colonoscopies are up-to-date with last colonoscopy 2014 showing diverticular disease and a small adenoma was removed.  At his age, it was not recommended that he undergo surveillance procedure unless there was some compelling reason.  He has a history of A. fib, has been followed by Joel [name] and has previously been on anticoagulation but due to chronic bleeding it was decided to stop anticoagulation.  Over the past couple years he has had an extensive work-up.  He has had 2 endoscopies that were completely negative, one [name] showing a small AVM in the stomach that was cauterized with the APC, small bowel capsule suggesting possible AVM in the proximal small bowel.  Subsequent small bowel enteroscopy was performed and was negative without any AVMs being detected.  The patient was referred to [name] for double balloon enteroscopy, however, before his appointment [name] canceled the program and no longer offers at service.  He recently has fallen and broken his hip and had to have this planned.  This was done approximately 5 months ago and he went to a nursing home for rehab.  He has done reasonably well since that other than chronic severe pain in his hip.  Patient tells me for the past 6 weeks this is gotten progressively worse as he has been up moving around and he has been taking Aleve on a regular basis at least twice a day for the past 4 to 6 weeks.  He has noticed that his stool is been intermittently dark.  His medication list does not indicate that he has been taking any acid reducing medication.  He denies any nausea vomiting or abdominal pain.  It is also notable that following his hip fracture he was on Lovenox for  some time the exact time somewhat unclear.  Past Medical History:  Diagnosis Date  . Allergic rhinitis   . Anemia   . Anxiety   . Aortic stenosis 2012   mild to mod   . Atrial fibrillation ([name]) 1991   with multiple [name]  . Bipolar affective disorder (Joel [name])   . CHF (congestive heart failure) ([place]) 08/30/2017  . COPD (chronic obstructive pulmonary disease) ([name])   . Diabetic neuropathy ([name])    in feet  . Diabetic neuropathy ([name])   . Dyslipidemia   . Fatigue    last year or so  . Gout   . Hypertension   . Insomnia   . Obesity   . On home oxygen therapy 2 1/2 liters with bipap at night  . OSA (obstructive sleep apnea)    severe, uses bipap 16 1/2 by 12 or 13 setting  . Prostate cancer ([name])   . Rectal bleeding 12/16/2015  . Type 2 diabetes mellitus (Jo[name])   . Vertigo     Past Surgical History:  Procedure Laterality Date  . CARDIOVERSION  yrs ago   prior to 1998  . COLONOSCOPY WITH PROPOFOL N/A 01/23/2013   Procedure: COLONOSCOPY WITH PROPOFOL;  Surgeon: [facility] Fair, MD;  Location: [name];  Service: [name];  Laterality: N/A;  . electro shock  1969   for depression  . ENTEROSCOPY Left 05/17/2016   Procedure: ENTEROSCOPY;  Surgeon: [facility] Corner, MD;  Location: [name];  Service: [name];  Laterality: Left;  . ESOPHAGOGASTRODUODENOSCOPY N/A 03/03/2016   Procedure: ESOPHAGOGASTRODUODENOSCOPY (EGD);  Surgeon: [facility] Irani, MD;  [name];  Service: [name];  Laterality: N/A;  . ESOPHAGOGASTRODUODENOSCOPY N/A 03/26/2016   Procedure: ESOPHAGOGASTRODUODENOSCOPY (EGD);  Surgeon: [facility] Irani, MD;  [facility]: [name];  Service: [name];  Laterality: N/A;  would like to use ultraslim scope  . ESOPHAGOGASTRODUODENOSCOPY (EGD) WITH PROPOFOL N/A 01/23/2013   Procedure: ESOPHAGOGASTRODUODENOSCOPY (EGD) WITH PROPOFOL;  Surgeon: [facility] Fair, MD;  [facility]: [name];  Service: [name];  Laterality: N/A;  . ESOPHAGOGASTRODUODENOSCOPY (EGD)  WITH PROPOFOL N/A 04/13/2016   Procedure: ESOPHAGOGASTRODUODENOSCOPY (EGD) WITH PROPOFOL;  Surgeon: [facility] Brace, MD;  [facility]: [name];  Service: Gastroenterology;  Laterality: N/A;  . GIVENS CAPSULE STUDY N/A 03/03/2016   Procedure: GIVENS CAPSULE STUDY;  Surgeon: [facility] Irani, MD;  Location: [name];  Service: [name];  Laterality: N/A;  . HEMORRHOID SURGERY N/A 12/16/2015   Procedure: PROCTOSCOPY WITH CONTROL OF BLEEDING;  Surgeon: [facility] Skates, MD;  Location: [place];  Service: General;  Laterality: N/A;  . HIP PINNING,CANNULATED Right 03/23/2017   Procedure: CANNULATED HIP PINNING;  Surgeon: [facility] Killings, MD;  [facility];  Service: Orthopedics;  Laterality: Right;  . KNEE SURGERY  1970's   rt  . MAZE  1998  . PROSTATE BIOPSY    . SHOULDER SURGERY  7-8 yrs ago   rt    Family History  Problem Relation Age of Onset  . Tuberculosis Maternal Grandmother   . Heart attack Neg Hx   . Diabetes Neg Hx   . CAD Neg Hx   . Cancer Neg Hx     Social History:  reports that he quit smoking about 41 years ago. His smoking use included cigarettes. He has a 50.00 pack-year smoking history. He has never used smokeless tobacco. He reports that he does not drink alcohol or use drugs.  Allergies:  Allergies  Allergen Reactions  . Penicillins Anaphylaxis    Has patient had a PCN reaction causing immediate rash, facial/tongue/throat swelling, SOB or lightheadedness with hypotension: unknown Has patient had a PCN reaction causing severe rash involving mucus membranes or skin necrosis: unknown Has patient had a PCN reaction that required hospitalization: unknown Has patient had a PCN reaction occurring within the last 10 years: no If all of the above answers are "NO", then may proceed with Cephalosporin use.   Marland Kitchen Antihistamines, Loratadine-Type Other (See Comments)    depression  . Other     Beta blockers-Novacaine  Caused depression    Medications; Prior to Admission  medications   Medication Sig Start Date End Date Taking? Authorizing Provider  allopurinol (ZYLOPRIM) 100 MG tablet Take 100 mg by mouth daily.    Yes [provider]  Ascorbic Acid (VITAMIN C PO) Take 1 tablet by mouth daily.   Yes [provider]  buPROPion (WELLBUTRIN XL) 150 MG 24 hr tablet Take 1 tablet (150 mg total) by mouth daily. 12/18/15  Yes Ambrose Finland, MD  cholecalciferol (VITAMIN D) 1000 units tablet Take 1,000 Units by mouth daily.   Yes [provider]  citalopram (CELEXA) 40 MG tablet Take 40 mg by mouth daily.    Yes [provider]  Cyanocobalamin (VITAMIN B-12 PO) Take 1 tablet by mouth daily.   Yes [provider]  dextromethorphan-guaiFENesin (MUCINEX DM) 30-600 MG 12hr tablet Take 1 tablet by mouth 2 (two) times daily.   Yes [provider]  diltiazem (CARDIZEM SR) 120  MG 12 hr capsule Take 120 mg by mouth daily.  03/18/17  Yes [provider]  enoxaparin (LOVENOX) 30 MG/0.3ML injection Inject 0.3 mLs (30 mg total) into the skin daily. 03/26/17  Yes Lanae Crumbly, PA-C  fluticasone [facility]) 50 MCG/ACT nasal spray Place 2 sprays into both nostrils daily. Patient taking differently: Place 2 sprays into both nostrils daily as needed for allergies or rhinitis.  01/27/16  Yes Waynetta Pean, PA-C  furosemide (LASIX) 40 MG tablet Take 1 tablet (40 mg total) by mouth daily. Patient taking differently: Take 80 mg by mouth daily.  03/27/17  Yes Roxan Hockey, MD  glimepiride (AMARYL) 1 MG tablet Take 1 mg by mouth daily with breakfast.  03/01/17  Yes [provider]  iron polysaccharides (NIFEREX) 150 MG capsule Take 1 capsule (150 mg total) by mouth 2 (two) times daily. 10/23/14  Yes Eugenie Filler, MD  losartan (COZAAR) 25 MG tablet Take 1 tablet (25 mg total) by mouth daily. 03/30/17  Yes Emokpae, Courage, MD  lovastatin (MEVACOR) 20 MG tablet Take 20 mg by mouth at bedtime.    Yes [provider]  meclizine (ANTIVERT) 25 MG tablet Take 1 tablet (25 mg total) by mouth 3 (three) times daily as needed for dizziness. Patient taking differently: Take 25 mg by mouth 2 (two) times daily.  01/28/16  Yes Eber Jones, MD  metFORMIN (GLUCOPHAGE) 1000 MG tablet Take 1,000 mg by mouth 2 (two) times daily.  05/17/14  Yes [provider]  Multiple Vitamins-Minerals (VISION FORMULA PO) Take 1 tablet by mouth 2 (two) times daily.   Yes [provider]  omeprazole (PRILOSEC) 20 MG capsule Take 1 capsule (20 mg total) by mouth 2 (two) times daily. 03/27/17 03/27/18 Yes Emokpae, Courage, MD  OXYGEN Inhale 2 L into the lungs as needed (shortness of breath).    Yes [provider]  potassium chloride SA (K-DUR,KLOR-CON) 20 MEQ tablet Take 20 mEq by mouth daily.  09/10/14  Yes [provider]  senna-docusate (SENOKOT-S) 8.6-50 MG tablet Take 2 tablets by mouth 2 (two) times daily. 03/27/17  Yes Emokpae, Courage, MD  temazepam (RESTORIL) 15 MG capsule Take 1 capsule (15 mg total) by mouth at bedtime. 03/27/17  Yes Emokpae, Courage, MD   . allopurinol  100 mg Oral Daily  . buPROPion  150 mg Oral Daily  . cholecalciferol  1,000 Units Oral Daily  . citalopram  40 mg Oral Daily  . dextromethorphan-guaiFENesin  1 tablet Oral BID  . diclofenac sodium  4 g Topical QID  . diltiazem  120 mg Oral Daily  . furosemide  40 mg Oral Daily  . insulin aspart  0-15 Units Subcutaneous Q4H  . iron polysaccharides  150 mg Oral BID  . losartan  25 mg Oral Daily  . metFORMIN  1,000 mg Oral BID WC  . pantoprazole  40 mg Oral Daily  . potassium chloride SA  20 mEq Oral Daily  . pravastatin  20 mg Oral q1800  . senna-docusate  2 tablet Oral BID  . temazepam  15 mg Oral QHS   PRN Meds acetaminophen **OR** acetaminophen, fluticasone, meclizine, ondansetron **OR** ondansetron (ZOFRAN) IV Results for orders placed or performed during the hospital encounter of 08/30/17 (from the  past 48 hour(s))  Basic metabolic panel     Status: Abnormal   Collection Time: 08/30/17  5:44 PM  Result Value Ref Range   Sodium 141 135 - 145 mmol/L   Potassium 4.4  3.5 - 5.1 mmol/L   Chloride 106 98 - 111 mmol/L   CO2 26 22 - 32 mmol/L   Glucose, Bld 186 (H) 70 - 99 mg/dL   BUN 26 (H) 8 - 23 mg/dL   Creatinine, Ser 1.76 (H) 0.61 - 1.24 mg/dL   Calcium 9.3 8.9 - 10.3 mg/dL   GFR calc non Af Amer 34 (L) >60 mL/min   GFR calc Af Amer 40 (L) >60 mL/min    Comment: (NOTE) The eGFR has been calculated using the CKD EPI equation. This calculation has not been validated in all clinical situations. eGFR's persistently <60 mL/min signify possible Chronic Kidney Disease.    Anion gap 9 5 - 15    Comment: Performed at [name], [place] [address] [place], [place] [address]  CBC     Status: Abnormal   Collection Time: 08/30/17  5:44 PM  Result Value Ref Range   WBC 5.1 4.0 - 10.5 K/uL   RBC 2.51 (L) 4.22 - 5.81 MIL/uL   Hemoglobin 7.3 (L) 13.0 - 17.0 g/dL   HCT 24.2 (L) 39.0 - 52.0 %   MCV 96.4 78.0 - 100.0 fL   MCH 29.1 26.0 - 34.0 pg   MCHC 30.2 30.0 - 36.0 g/dL   RDW 18.7 (H) 11.5 - 15.5 %   Platelets 196 150 - 400 K/uL    Comment: Performed at [facility], [place] [address], [place], [place] [address]  Hemoglobin A1c     Status: Abnormal   Collection Time: 08/30/17  5:44 PM  Result Value Ref Range   Hgb A1c MFr Bld 6.0 (H) 4.8 - 5.6 %    Comment: (NOTE) Pre diabetes:          5.7%-6.4% Diabetes:              >6.4% Glycemic control for   <7.0% adults with diabetes    Mean Plasma Glucose 125.5 mg/dL    Comment: Performed at [facility] [name], [place], [place] [address]  Type and screen [place]     Status: None (Preliminary result)   Collection Time: 08/30/17  6:56 PM  Result Value Ref Range   ABO/RH(D) O POS    Antibody Screen NEG    Sample Expiration 09/02/2017    Unit Number [id number]     Blood Component Type RED CELLS,LR    Unit division 00    Status of Unit ISSUED    Transfusion Status OK TO TRANSFUSE    Crossmatch Result      Compatible Performed at [facility], [place] [address], [place], [place] [address]    Unit Number [id number]    Blood Component Type RED CELLS,LR    Unit division 00    Status of Unit ISSUED    Transfusion Status OK TO TRANSFUSE    Crossmatch Result Compatible   POC occult blood, ED     Status: Abnormal   Collection Time: 08/30/17  7:36 PM  Result Value Ref Range   Fecal Occult Bld POSITIVE (A) NEGATIVE  Prepare RBC     Status: None   Collection Time: 08/30/17  7:54 PM  Result Value Ref Range   Order Confirmation      ORDER PROCESSED BY BLOOD BANK Performed at [facility], [place] [address] [place], [place] [address]   Protime-INR     Status: None   Collection Time: 08/30/17  9:35 PM  Result  Value Ref Range   Prothrombin Time 13.9 11.4 - 15.2 seconds   INR 1.07     Comment: Performed at [facility], [place] [address] [place], [place] [address]  CBG monitoring, ED     Status: Abnormal   Collection Time: 08/30/17  9:49 PM  Result Value Ref Range   Glucose-Capillary 115 (H) 70 - 99 mg/dL  Glucose, capillary     Status: Abnormal   Collection Time: 08/30/17 11:02 PM  Result Value Ref Range   Glucose-Capillary 155 (H) 70 - 99 mg/dL  Glucose, capillary     Status: Abnormal   Collection Time: 08/31/17  4:58 AM  Result Value Ref Range   Glucose-Capillary 120 (H) 70 - 99 mg/dL  Basic metabolic panel     Status: Abnormal   Collection Time: 08/31/17  6:47 AM  Result Value Ref Range   Sodium 142 135 - 145 mmol/L   Potassium 4.5 3.5 - 5.1 mmol/L   Chloride 107 98 - 111 mmol/L   CO2 25 22 - 32 mmol/L   Glucose, Bld 141 (H) 70 - 99 mg/dL   BUN 25 (H) 8 - 23 mg/dL   Creatinine, Ser 1.40 (H) 0.61 - 1.24 mg/dL   Calcium 9.4 8.9 - 10.3 mg/dL   GFR calc non Af Amer 45 (L) >60 mL/min    GFR calc Af Amer 52 (L) >60 mL/min    Comment: (NOTE) The eGFR has been calculated using the CKD EPI equation. This calculation has not been validated in all clinical situations. eGFR's persistently <60 mL/min signify possible Chronic Kidney Disease.    Anion gap 10 5 - 15    Comment: Performed at [facility], [place] [name] [place], [place] [address]  CBC     Status: Abnormal   Collection Time: 08/31/17  6:47 AM  Result Value Ref Range   WBC 5.6 4.0 - 10.5 K/uL   RBC 2.96 (L) 4.22 - 5.81 MIL/uL   Hemoglobin 8.9 (L) 13.0 - 17.0 g/dL   HCT 27.9 (L) 39.0 - 52.0 %   MCV 94.3 78.0 - 100.0 fL   MCH 30.1 26.0 - 34.0 pg   MCHC 31.9 30.0 - 36.0 g/dL   RDW 17.6 (H) 11.5 - 15.5 %   Platelets 168 150 - 400 K/uL    Comment: Performed at [facility], [place] [address], [place], [place] [address]    No results found. ROS: As above            Blood pressure (!) 147/95, pulse 92, temperature 97.8 F (36.6 C), temperature source Oral, resp. rate (!) 22, height 5' 9" (1.753 m), weight 108.9 kg (240 lb), SpO2 98 %.  Physical exam:   General--Pleasant white male in no acute distress ENT--nonicteric Neck--full range of motion Heart--regular rate and rhythm Lungs--clear Abdomen--obese but nontender Psych--appropriate affect, alert and oriented   Assessment: 1.  [name] bleed.  This is a chronic ongoing problem and he has had extensive work-up over the past year to year and a half.  Suspect that he has [name] AVMs that bled while on anticoagulation with the decision being made to stop anticoagulation.  Unfortunately with his recent hip fracture he has had a [name] amount of pain and was told that Aleve was not like aspirin and has been taking Aleve for the past several weeks.  His stools have been dark.  Suspect that he may well have gastric ulcer 2.  Recent hip fracture 3.  Atrial fibrillation.  Previously on anticoagulation but decision has been made with  [name] that due to the [name] bleeding he will stop the anticoagulation. 4.  Diabetes with diabetic neuropathy 5.  History of aortic stenosis and CHF 6.  History of COPD on home oxygen 7.  OSA 8.  History of prostate cancer  Plan: 1.  We will go ahead and proceed with EGD to evaluate whether here or not he may have developed upper [name] ulceration due to NSAID use. 2.  We will go ahead and empirically start him on IV PPI   Nancy Fetter 08/31/2017, 7:21 AM   This note was created using voice recognition software and minor errors may Have occurred unintentionally. Pager: [phone] If no answer or after hours call [phone]

## 2017-08-31 NOTE — Anesthesia Postprocedure Evaluation (Signed)
Anesthesia Post Note  Patient: [name]  Procedure(s) Performed: ESOPHAGOGASTRODUODENOSCOPY (EGD) (N/A )     Patient location during evaluation: Endoscopy Anesthesia Type: MAC Level of consciousness: awake Pain management: pain level controlled Vital Signs Assessment: post-procedure vital signs reviewed and stable Respiratory status: spontaneous breathing Cardiovascular status: stable Postop Assessment: no apparent nausea or vomiting Anesthetic complications: no    Last Vitals:  Vitals:   08/31/17 0800 08/31/17 0841  BP: (!) 142/99 134/88  Pulse: 80 78  Resp: (!) 23 (!) 23  Temp: 36.4 C 36.7 C  SpO2: 95% 97%    Last Pain:  Vitals:   08/31/17 0841  TempSrc: Oral  PainSc: 0-No pain   Pain Goal:                 Gail Vendetti JR,JOHN Mordecai Tindol

## 2017-08-31 NOTE — Progress Notes (Signed)
Pt. placed on BiPAP for h/s I-16/E-12, oxygen line place on at 2 lpm, humidifier filled with S/W, using own hose along with full face mask, tolerating well, made aware to notify if needed.

## 2017-08-31 NOTE — Transfer of Care (Signed)
Immediate Anesthesia Transfer of Care Note  Patient: PIO EATHERLY  Procedure(s) Performed: Procedure(s): ESOPHAGOGASTRODUODENOSCOPY (EGD) (N/A)  Patient Location: PACU  Anesthesia Type:MAC  Level of Consciousness:  sedated, patient cooperative and responds to stimulation  Airway & Oxygen Therapy:Patient Spontanous Breathing and Patient connected to face mask oxgen  Post-op Assessment:  Report given to PACU RN and Post -op Vital signs reviewed and stable  Post vital signs:  Reviewed and stable  Last Vitals:  Vitals:   08/31/17 0800 08/31/17 0841  BP: (!) 142/99 134/88  Pulse: 80 78  Resp: (!) 23 (!) 23  Temp: 36.4 C 36.7 C  SpO2: 74% 12%    Complications: No apparent anesthesia complications

## 2017-08-31 NOTE — Progress Notes (Signed)
PROGRESS NOTE    Joel [name]  MGQ:[id number] DOB: Jun 12, 1932 DOA: 08/30/2017 PCP: Wenda Low, MD   Brief Narrative: Joel [name] is a 84 y.o. male with medical history significant for CAD, Afib not on anticoagulation due to history of recurrent GI bleed felt to be from small gastric AVMs causing iron deficiency anemia. He required several transfusions last year, but has since been doing well. He has been following up with hematology but has not followed up there since March 2019 it seems. The plan was for him to follow up monthlly with transfusions to keep Hb above 9. He has been doing well, but in the last 2-3 weeks he noted some abdominal bloating as well as weaness. Yesterday he want for a bone density scan and asked that his hemoglobin be checked, he was called to come to ER today due to Hb of 7. Denies nausea, abdominal pain, reflux, or BRBPR. He has very dark stools but he takes iron BID.  He has had some SOB with exertion, but no chest pain, dizziness or syncope.  In the ER repeat lab confirmed Hb 7, two units of blood have been ordered and Hemoccult was positive for blood. Hospitalist was asked to admit, and a call has been placed to GI for inpatient consultation. Patient underwent EGD which showed Gastritis, Duodenitis, and Esophageal Candidiasis. Repeat Hb improved to 8.9 but will continue to Monitor carefully.   Assessment & Plan:   Active Problems:   DM2 (diabetes mellitus, type 2) ([place])   Pulmonary hypertension (HCC)   Hypertension   Chronic GI bleeding   Persistent atrial fibrillation (HCC)   Blood loss anemia  Acute on Chronic Blood Loss Anemia -Admitted as Inpatient -Possibly 2/2 to NSAID use for Recent Hip Fx -Hb/Hct on Admission was 7.3/24.2; Typed and Screened and Transfused 2 units of pRBC's and Hb/Hct is now 09/09/25.9; Repeat Hb this AM was 8.9 [facility]ology consulted for further evaluation and recommendations and underwent EGD which showed Diffuse  moderate inflammation characterized by erosions, erythema and friability was found in the entire examined stomach.  Localized mild inflammation characterized by erosions and erythema was found in the duodenal bulb -Continue to Monitor for S/Sx of Bleeding -C/w Iron Polysaccharides 150 mg po BID -Protonix 40 mg po Daily increased to 40 mg po BID by Gastroenterology  -Continue to Monitor Hb/Hct q8h; I discussed the case with Dr. Oletta Lamas and if patient continues to Drop may undergo Colonoscopy   DMType2 complicated by Diabetic Neuropathy in feet -HbA1c was 6.0  -Continue to Monitor CBG's -Hold Metformin 1000 mg po BID and Glimepiride 1 mg po Daily with Breakfast  -C/w Gabapentin  -Continued  Moderate Novolog SSI q4h while NPO for possible GI Intervention but will change to Moderate AC/HS now that patient is on a Heart Healthy/Carb Modified Diet   pHTN -C/w Lasix 40 mg Daily; Will need to Verify Dose  HTN -C/w Diltiazem 120 mg po Daily, Hold Losartan 25 mg po Daily for now   Chronic GIB -As Above   Persistent Atrial Fibrillation -s/p Multiple DCCV -C/w Diltiazem 120 mg po Daily -CHADS2VASc Elevated of 4 but not on Anticoagulation due chronic GIB -Per Cardiology should have ben on Baby ASA but stopped.   Aortic Stenosis  -Was Moderate on last ECHO at Cardiology office -Per Cardiology monitor clinically and follow with repeat [facility] though he is a poor candidate in the future even probably for TAVR -Repeat ECHO this Visit   COPD on Home O2 -  C/w 2 liters of O2 via [place] -C/w Dextromethorphan-Guaifenesin 30-600 mg po q12h  Bipolar Affective Disorder -C/w Citalopram but to 20 mg po Daily and Bupropion 150 mg po Daily  -C/w Temazepam 15 mg po qHS  OSA -Will order CPAP   Esophageal Candidiasis -Noted on EGD with plaques consistent with Candidiasis -Started on Fluconazole 100 mg po TID   HLD/Dyslipidemia  -C/w Lovastatin 20 mg po Daily qHS   Gout -C/w Home Allopurinol 100 mg po  Daily   Recent Right Hip Fracture -PT/OT to Evaluate and Treat -X-Ray on 06/07/17 showed: "Standing AP pelvis and frog-leg lateral x-ray demonstrates cannulated screw fixation femoral neck fracture with slight backout of cannulated screws unchanged from March 2019. No evidence of AVN and no displacement with interval healing of the femoral neck fracture.  Impression: Satisfactory cannulated screw fixation femoral neck fracture  with good position." -Dr Lorin Mercy recommended Ambulation and LE Strengthening   CKD Stage 3 -Cr appears to be elevated since the beginning of the year -Maybe new Baseline -BUN/Cr stable compared to prior values this year -Avoid Nephrotoxic Medications if possible -Repeat CMP in AM   BPPV -C/w Home Meclizine 25 mg po TIDprn  DVT prophylaxis: SCDs Code Status: FULL CODE Family Communication: No family present at bedside Disposition Plan: Pending Further workup and GI Clearance   Consultants:   [facility]    Procedures:  EGD Findings:      Diffuse, white plaques were found in the mid esophagus.      Diffuse moderate inflammation characterized by erosions, erythema and       friability was found in the entire examined stomach.      Localized mild inflammation characterized by erosions and erythema was       found in the duodenal bulb. Impression:               - Esophageal plaques were found, consistent with                            candidiasis. Due to the patient's history of                            diabetes and the fact that he had been bleeding                            this was not biopsy                           - Gastritis.                           - Duodenitis.                           - No specimens collected.  ECHOCARDIOGRAM -ordered and pending    Antimicrobials:  Anti-infectives (From admission, onward)   None     Subjective: Seen and examined at bedside and felt okay.  States abdomen was slightly distended but  not hurting.  No chest pain, shortness breath, nausea, vomiting.  States he has some fluid in his legs.  No other concerns or complaints at this time.  Objective: Vitals:   08/31/17 0100 08/31/17 0155 08/31/17 0430 08/31/17 0504  BP: (!) 141/74 125/67 Marland Kitchen)  147/70 (!) 147/95  Pulse: 78 76 87 92  Resp: 18 18 18  (!) 22  Temp: 98 F (36.7 C) 98.2 F (36.8 C) 98.1 F (36.7 C) 97.8 F (36.6 C)  TempSrc: Oral Oral Oral Oral  SpO2: 97% 94% 97% 98%  Weight:      Height:        Intake/Output Summary (Last 24 hours) at 08/31/2017 0719 Last data filed at 08/31/2017 0415 Gross per 24 hour  Intake 312.9 ml  Output -  Net 312.9 ml   Filed Weights   08/30/17 1720 08/30/17 1724 08/30/17 2240  Weight: 108.9 kg (240 lb) 108.9 kg (240 lb) 108.9 kg (240 lb)   Examination: Physical Exam:  Constitutional: WN/WD obese Caucasian male in NAD and appears calm but uncomfortable  Eyes:  Lids and conjunctivae normal, sclerae anicteric  ENMT: External Ears, Nose appear normal.  Neck: Appears normal, supple, no cervical masses, normal ROM, no appreciable thyromegaly, no JVD Respiratory: Diminished to auscultation bilaterally, no wheezing, rales, rhonchi or crackles. Normal respiratory effort and patient is not tachypenic. No accessory muscle use. Wearing Supplemental O2 via [place] Cardiovascular: Irregularly Irregular, Has a 3/6 Systolic Murmur. S1 and S2 auscultated. 1+ LE extremity edema.   Abdomen: Soft, non-tender, non-distended. No masses palpated. No appreciable hepatosplenomegaly. Bowel sounds positive x4.  GU: Deferred. Musculoskeletal: No clubbing / cyanosis of digits/nails. No joint deformity upper and lower extremities.  Skin: No rashes, lesions, ulcers on a limited skin evaluation. No induration; Warm and dry.  Neurologic: CN 2-12 grossly intact with no focal deficits. Romberg sign and cerebellar reflexes not assessed.  Psychiatric: Normal judgment and insight. Alert and awake. Normal mood and  appropriate affect.   Data Reviewed: I have personally reviewed following labs and imaging studies  CBC: Recent Labs  Lab 08/30/17 1744 08/31/17 0647  WBC 5.1 5.6  HGB 7.3* 8.9*  HCT 24.2* 27.9*  MCV 96.4 94.3  PLT 196 262   Basic Metabolic Panel: Recent Labs  Lab 08/30/17 1744  NA 141  K 4.4  CL 106  CO2 26  GLUCOSE 186*  BUN 26*  CREATININE 1.76*  CALCIUM 9.3   GFR: Estimated Creatinine Clearance: 39.4 mL/min (A) (by C-G formula based on SCr of 1.76 mg/dL (H)). Liver Function Tests: No results for input(s): AST, ALT, ALKPHOS, BILITOT, PROT, ALBUMIN in the last 168 hours. No results for input(s): LIPASE, AMYLASE in the last 168 hours. No results for input(s): AMMONIA in the last 168 hours. Coagulation Profile: Recent Labs  Lab 08/30/17 2135  INR 1.07   Cardiac Enzymes: No results for input(s): CKTOTAL, CKMB, CKMBINDEX, TROPONINI in the last 168 hours. BNP (last 3 results) No results for input(s): PROBNP in the last 8760 hours. HbA1C: Recent Labs    08/30/17 1744  HGBA1C 6.0*   CBG: Recent Labs  Lab 08/30/17 2149 08/30/17 2302 08/31/17 0458  GLUCAP 115* 155* 120*   Lipid Profile: No results for input(s): CHOL, HDL, LDLCALC, TRIG, CHOLHDL, LDLDIRECT in the last 72 hours. Thyroid Function Tests: No results for input(s): TSH, T4TOTAL, FREET4, T3FREE, THYROIDAB in the last 72 hours. Anemia Panel: No results for input(s): VITAMINB12, FOLATE, FERRITIN, TIBC, IRON, RETICCTPCT in the last 72 hours. Sepsis Labs: No results for input(s): PROCALCITON, LATICACIDVEN in the last 168 hours.  No results found for this or any previous visit (from the past 240 hour(s)).   Radiology Studies: No results found.   Scheduled Meds: . allopurinol  100 mg Oral Daily  . buPROPion  150 mg Oral Daily  . cholecalciferol  1,000 Units Oral Daily  . citalopram  40 mg Oral Daily  . dextromethorphan-guaiFENesin  1 tablet Oral BID  . diclofenac sodium  4 g Topical QID  .  diltiazem  120 mg Oral Daily  . furosemide  40 mg Oral Daily  . insulin aspart  0-15 Units Subcutaneous Q4H  . iron polysaccharides  150 mg Oral BID  . losartan  25 mg Oral Daily  . metFORMIN  1,000 mg Oral BID WC  . pantoprazole  40 mg Oral Daily  . potassium chloride SA  20 mEq Oral Daily  . pravastatin  20 mg Oral q1800  . senna-docusate  2 tablet Oral BID  . temazepam  15 mg Oral QHS   Continuous Infusions:   LOS: 1 day   Kerney Elbe, DO [facility] Pager [phone]  If 7PM-7AM, please contact night-coverage [email or web address] Password [facility] 08/31/2017, 7:19 AM

## 2017-08-31 NOTE — H&P (View-Only) (Signed)
[facility] CONSULT Reason for consult: GI bleeding Referring Physician: [facility] hospitalist.  PCP: Dr. Lysle Rubens.  Primary GI: Dr. Barbee Cough is an 84 y.o. male.  HPI: He has a long history of chronic GI bleeding.  His work-up is been extensive.  His colonoscopies are up-to-date with last colonoscopy 2014 showing diverticular disease and a small adenoma was removed.  At his age, it was not recommended that he undergo surveillance procedure unless there was some compelling reason.  He has a history of A. fib, has been followed by Dr. Marlou Porch and has previously been on anticoagulation but due to chronic bleeding it was decided to stop anticoagulation.  Over the past couple years he has had an extensive work-up.  He has had 2 endoscopies that were completely negative, one endoscopy showing a small AVM in the stomach that was cauterized with the APC, small bowel capsule suggesting possible AVM in the proximal small bowel.  Subsequent small bowel enteroscopy was performed and was negative without any AVMs being detected.  The patient was referred to [facility] for double balloon enteroscopy, however, before his appointment [name] canceled the program and no longer offers at service.  He recently has fallen and broken his hip and had to have this planned.  This was done approximately 5 months ago and he went to a nursing home for rehab.  He has done reasonably well since that other than chronic severe pain in his hip.  Patient tells me for the past 6 weeks this is gotten progressively worse as he has been up moving around and he has been taking Aleve on a regular basis at least twice a day for the past 4 to 6 weeks.  He has noticed that his stool is been intermittently dark.  His medication list does not indicate that he has been taking any acid reducing medication.  He denies any nausea vomiting or abdominal pain.  It is also notable that following his hip fracture he was on Lovenox for  some time the exact time somewhat unclear.  Past Medical History:  Diagnosis Date  . Allergic rhinitis   . Anemia   . Anxiety   . Aortic stenosis 2012   mild to mod   . Atrial fibrillation ([place]) 1991   with multiple DCCV  . Bipolar affective disorder ([place])   . CHF (congestive heart failure) ([place]) 08/30/2017  . COPD (chronic obstructive pulmonary disease) ([place])   . Diabetic neuropathy ([place])    in feet  . Diabetic neuropathy ([place])   . Dyslipidemia   . Fatigue    last year or so  . Gout   . Hypertension   . Insomnia   . Obesity   . On home oxygen therapy 2 1/2 liters with bipap at night  . OSA (obstructive sleep apnea)    severe, uses bipap 16 1/2 by 12 or 13 setting  . Prostate cancer ([place])   . Rectal bleeding 12/16/2015  . Type 2 diabetes mellitus ([place])   . Vertigo     Past Surgical History:  Procedure Laterality Date  . CARDIOVERSION  yrs ago   prior to 1998  . COLONOSCOPY WITH PROPOFOL N/A 01/23/2013   Procedure: COLONOSCOPY WITH PROPOFOL;  Surgeon: Garlan Fair, MD;  Location: [facility] END[facility];  Service: Endoscopy;  Laterality: N/A;  . electro shock  1969   for depression  . ENTEROSCOPY Left 05/17/2016   Procedure: ENTEROSCOPY;  Surgeon: Wilford Corner, MD;  Location: [facility];  Service: Endoscopy;  Laterality: Left;  . ESOPHAGOGASTRODUODENOSCOPY N/A 03/03/2016   Procedure: ESOPHAGOGASTRODUODENOSCOPY (EGD);  Surgeon: Teena Irani, MD;  Location: [facility];  Service: Endoscopy;  Laterality: N/A;  . ESOPHAGOGASTRODUODENOSCOPY N/A 03/26/2016   Procedure: ESOPHAGOGASTRODUODENOSCOPY (EGD);  Surgeon: Teena Irani, MD;  Location: [facility] ENDOSCOPY;  Service: Endoscopy;  Laterality: N/A;  would like to use ultraslim scope  . ESOPHAGOGASTRODUODENOSCOPY (EGD) WITH PROPOFOL N/A 01/23/2013   Procedure: ESOPHAGOGASTRODUODENOSCOPY (EGD) WITH PROPOFOL;  Surgeon: Garlan Fair, MD;  Location: [facility];  Service: Endoscopy;  Laterality: N/A;  . ESOPHAGOGASTRODUODENOSCOPY (EGD)  WITH PROPOFOL N/A 04/13/2016   Procedure: ESOPHAGOGASTRODUODENOSCOPY (EGD) WITH PROPOFOL;  Surgeon: Otis Brace, MD;  Location: [place];  Service: Gastroenterology;  Laterality: N/A;  . GIVENS CAPSULE STUDY N/A 03/03/2016   Procedure: GIVENS CAPSULE STUDY;  Surgeon: Teena Irani, MD;  Location: [facility];  Service: Endoscopy;  Laterality: N/A;  . HEMORRHOID SURGERY N/A 12/16/2015   Procedure: PROCTOSCOPY WITH CONTROL OF BLEEDING;  Surgeon: Fanny Skates, MD;  Location: [place];  Service: General;  Laterality: N/A;  . HIP PINNING,CANNULATED Right 03/23/2017   Procedure: CANNULATED HIP PINNING;  Surgeon: Marybelle Killings, MD;  Location: [facility];  Service: Orthopedics;  Laterality: Right;  . KNEE SURGERY  1970's   rt  . MAZE  1998  . PROSTATE BIOPSY    . SHOULDER SURGERY  7-8 yrs ago   rt    Family History  Problem Relation Age of Onset  . Tuberculosis Maternal Grandmother   . Heart attack Neg Hx   . Diabetes Neg Hx   . CAD Neg Hx   . Cancer Neg Hx     Social History:  reports that he quit smoking about 41 years ago. His smoking use included cigarettes. He has a 50.00 pack-year smoking history. He has never used smokeless tobacco. He reports that he does not drink alcohol or use drugs.  Allergies:  Allergies  Allergen Reactions  . Penicillins Anaphylaxis    Has patient had a PCN reaction causing immediate rash, facial/tongue/throat swelling, SOB or lightheadedness with hypotension: unknown Has patient had a PCN reaction causing severe rash involving mucus membranes or skin necrosis: unknown Has patient had a PCN reaction that required hospitalization: unknown Has patient had a PCN reaction occurring within the last 10 years: no If all of the above answers are "NO", then may proceed with Cephalosporin use.   Marland Kitchen Antihistamines, Loratadine-Type Other (See Comments)    depression  . Other     Beta blockers-Novacaine  Caused depression    Medications; Prior to Admission  medications   Medication Sig Start Date End Date Taking? Authorizing Provider  allopurinol (ZYLOPRIM) 100 MG tablet Take 100 mg by mouth daily.    Yes [provider]  Ascorbic Acid (VITAMIN C PO) Take 1 tablet by mouth daily.   Yes [provider]  buPROPion (WELLBUTRIN XL) 150 MG 24 hr tablet Take 1 tablet (150 mg total) by mouth daily. 12/18/15  Yes Ambrose Finland, MD  cholecalciferol (VITAMIN D) 1000 units tablet Take 1,000 Units by mouth daily.   Yes [provider]  citalopram (CELEXA) 40 MG tablet Take 40 mg by mouth daily.    Yes [provider]  Cyanocobalamin (VITAMIN B-12 PO) Take 1 tablet by mouth daily.   Yes [provider]  dextromethorphan-guaiFENesin (MUCINEX DM) 30-600 MG 12hr tablet Take 1 tablet by mouth 2 (two) times daily.   Yes [provider]  diltiazem (CARDIZEM SR) 120  MG 12 hr capsule Take 120 mg by mouth daily.  03/18/17  Yes [provider]  enoxaparin (LOVENOX) 30 MG/0.3ML injection Inject 0.3 mLs (30 mg total) into the skin daily. 03/26/17  Yes Owens, Ervie Mccard M, PA-C  fluticasone (FLONASE) 50 MCG/ACT nasal spray Place 2 sprays into both nostrils daily. Patient taking differently: Place 2 sprays into both nostrils daily as needed for allergies or rhinitis.  01/27/16  Yes Dansie, William, PA-C  furosemide (LASIX) 40 MG tablet Take 1 tablet (40 mg total) by mouth daily. Patient taking differently: Take 80 mg by mouth daily.  03/27/17  Yes Emokpae, Courage, MD  glimepiride (AMARYL) 1 MG tablet Take 1 mg by mouth daily with breakfast.  03/01/17  Yes [provider]  iron polysaccharides (NIFEREX) 150 MG capsule Take 1 capsule (150 mg total) by mouth 2 (two) times daily. 10/23/14  Yes Thompson, Daniel V, MD  losartan (COZAAR) 25 MG tablet Take 1 tablet (25 mg total) by mouth daily. 03/30/17  Yes Emokpae, Courage, MD  lovastatin (MEVACOR) 20 MG tablet Take 20 mg by mouth at bedtime.    Yes [provider]  meclizine (ANTIVERT) 25 MG tablet Take 1 tablet (25 mg total) by mouth 3 (three) times daily as needed for dizziness. Patient taking differently: Take 25 mg by mouth 2 (two) times daily.  01/28/16  Yes Kadolph, Alexandria U, MD  metFORMIN (GLUCOPHAGE) 1000 MG tablet Take 1,000 mg by mouth 2 (two) times daily.  05/17/14  Yes [provider]  Multiple Vitamins-Minerals (VISION FORMULA PO) Take 1 tablet by mouth 2 (two) times daily.   Yes [provider]  omeprazole (PRILOSEC) 20 MG capsule Take 1 capsule (20 mg total) by mouth 2 (two) times daily. 03/27/17 03/27/18 Yes Emokpae, Courage, MD  OXYGEN Inhale 2 L into the lungs as needed (shortness of breath).    Yes [provider]  potassium chloride SA (K-DUR,KLOR-CON) 20 MEQ tablet Take 20 mEq by mouth daily.  09/10/14  Yes [provider]  senna-docusate (SENOKOT-S) 8.6-50 MG tablet Take 2 tablets by mouth 2 (two) times daily. 03/27/17  Yes Emokpae, Courage, MD  temazepam (RESTORIL) 15 MG capsule Take 1 capsule (15 mg total) by mouth at bedtime. 03/27/17  Yes Emokpae, Courage, MD   . allopurinol  100 mg Oral Daily  . buPROPion  150 mg Oral Daily  . cholecalciferol  1,000 Units Oral Daily  . citalopram  40 mg Oral Daily  . dextromethorphan-guaiFENesin  1 tablet Oral BID  . diclofenac sodium  4 g Topical QID  . diltiazem  120 mg Oral Daily  . furosemide  40 mg Oral Daily  . insulin aspart  0-15 Units Subcutaneous Q4H  . iron polysaccharides  150 mg Oral BID  . losartan  25 mg Oral Daily  . metFORMIN  1,000 mg Oral BID WC  . pantoprazole  40 mg Oral Daily  . potassium chloride SA  20 mEq Oral Daily  . pravastatin  20 mg Oral q1800  . senna-docusate  2 tablet Oral BID  . temazepam  15 mg Oral QHS   PRN Meds acetaminophen **OR** acetaminophen, fluticasone, meclizine, ondansetron **OR** ondansetron (ZOFRAN) IV Results for orders placed or performed during the hospital encounter of 08/30/17 (from the  past 48 hour(s))  Basic metabolic panel     Status: Abnormal   Collection Time: 08/30/17  5:44 PM  Result Value Ref Range   Sodium 141 135 - 145 mmol/L   Potassium 4.4   3.5 - 5.1 mmol/L   Chloride 106 98 - 111 mmol/L   CO2 26 22 - 32 mmol/L   Glucose, Bld 186 (H) 70 - 99 mg/dL   BUN 26 (H) 8 - 23 mg/dL   Creatinine, Ser 1.76 (H) 0.61 - 1.24 mg/dL   Calcium 9.3 8.9 - 10.3 mg/dL   GFR calc non Af Amer 34 (L) >60 mL/min   GFR calc Af Amer 40 (L) >60 mL/min    Comment: (NOTE) The eGFR has been calculated using the CKD EPI equation. This calculation has not been validated in all clinical situations. eGFR's persistently <60 mL/min signify possible Chronic Kidney Disease.    Anion gap 9 5 - 15    Comment: Performed at [facility], [address] [place], [place] [address]  CBC     Status: Abnormal   Collection Time: 08/30/17  5:44 PM  Result Value Ref Range   WBC 5.1 4.0 - 10.5 K/uL   RBC 2.51 (L) 4.22 - 5.81 MIL/uL   Hemoglobin 7.3 (L) 13.0 - 17.0 g/dL   HCT 24.2 (L) 39.0 - 52.0 %   MCV 96.4 78.0 - 100.0 fL   MCH 29.1 26.0 - 34.0 pg   MCHC 30.2 30.0 - 36.0 g/dL   RDW 18.7 (H) 11.5 - 15.5 %   Platelets 196 150 - 400 K/uL    Comment: Performed at [facility], [address] [place], [place] [address]  Hemoglobin A1c     Status: Abnormal   Collection Time: 08/30/17  5:44 PM  Result Value Ref Range   Hgb A1c MFr Bld 6.0 (H) 4.8 - 5.6 %    Comment: (NOTE) Pre diabetes:          5.7%-6.4% Diabetes:              >6.4% Glycemic control for   <7.0% adults with diabetes    Mean Plasma Glucose 125.5 mg/dL    Comment: Performed at [facility], [address] [place], [place] [address]  Type and screen [facility]     Status: None (Preliminary result)   Collection Time: 08/30/17  6:56 PM  Result Value Ref Range   ABO/RH(D) O POS    Antibody Screen NEG    Sample Expiration 09/02/2017    Unit Number [id number]     Blood Component Type RED CELLS,LR    Unit division 00    Status of Unit ISSUED    Transfusion Status OK TO TRANSFUSE    Crossmatch Result      Compatible Performed at [facility], [address] [place], [place] [address]    Unit Number [id number]    Blood Component Type RED CELLS,LR    Unit division 00    Status of Unit ISSUED    Transfusion Status OK TO TRANSFUSE    Crossmatch Result Compatible   POC occult blood, ED     Status: Abnormal   Collection Time: 08/30/17  7:36 PM  Result Value Ref Range   Fecal Occult Bld POSITIVE (A) NEGATIVE  Prepare RBC     Status: None   Collection Time: 08/30/17  7:54 PM  Result Value Ref Range   Order Confirmation      ORDER PROCESSED BY BLOOD BANK Performed at [facility], [address] [place], [place] [address]   Protime-INR     Status: None   Collection Time: 08/30/17  9:35 PM  Result   Value Ref Range   Prothrombin Time 13.9 11.4 - 15.2 seconds   INR 1.07     Comment: Performed at [facility], [address] [place], [place] [address]  CBG monitoring, ED     Status: Abnormal   Collection Time: 08/30/17  9:49 PM  Result Value Ref Range   Glucose-Capillary 115 (H) 70 - 99 mg/dL  Glucose, capillary     Status: Abnormal   Collection Time: 08/30/17 11:02 PM  Result Value Ref Range   Glucose-Capillary 155 (H) 70 - 99 mg/dL  Glucose, capillary     Status: Abnormal   Collection Time: 08/31/17  4:58 AM  Result Value Ref Range   Glucose-Capillary 120 (H) 70 - 99 mg/dL  Basic metabolic panel     Status: Abnormal   Collection Time: 08/31/17  6:47 AM  Result Value Ref Range   Sodium 142 135 - 145 mmol/L   Potassium 4.5 3.5 - 5.1 mmol/L   Chloride 107 98 - 111 mmol/L   CO2 25 22 - 32 mmol/L   Glucose, Bld 141 (H) 70 - 99 mg/dL   BUN 25 (H) 8 - 23 mg/dL   Creatinine, Ser 1.40 (H) 0.61 - 1.24 mg/dL   Calcium 9.4 8.9 - 10.3 mg/dL   GFR calc non Af Amer 45 (L) >60 mL/min    GFR calc Af Amer 52 (L) >60 mL/min    Comment: (NOTE) The eGFR has been calculated using the CKD EPI equation. This calculation has not been validated in all clinical situations. eGFR's persistently <60 mL/min signify possible Chronic Kidney Disease.    Anion gap 10 5 - 15    Comment: Performed at [facility], [address] [place], [place] [address]  CBC     Status: Abnormal   Collection Time: 08/31/17  6:47 AM  Result Value Ref Range   WBC 5.6 4.0 - 10.5 K/uL   RBC 2.96 (L) 4.22 - 5.81 MIL/uL   Hemoglobin 8.9 (L) 13.0 - 17.0 g/dL   HCT 27.9 (L) 39.0 - 52.0 %   MCV 94.3 78.0 - 100.0 fL   MCH 30.1 26.0 - 34.0 pg   MCHC 31.9 30.0 - 36.0 g/dL   RDW 17.6 (H) 11.5 - 15.5 %   Platelets 168 150 - 400 K/uL    Comment: Performed at [facility], [address] [place], [place] [address]    No results found. ROS: As above            Blood pressure (!) 147/95, pulse 92, temperature 97.8 F (36.6 C), temperature source Oral, resp. rate (!) 22, height 5' 9" (1.753 m), weight 108.9 kg (240 lb), SpO2 98 %.  Physical exam:   General--Pleasant white male in no acute distress ENT--nonicteric Neck--full range of motion Heart--regular rate and rhythm Lungs--clear Abdomen--obese but nontender Psych--appropriate affect, alert and oriented   Assessment: 1.  GI bleed.  This is a chronic ongoing problem and he has had extensive work-up over the past year to year and a half.  Suspect that he has GI AVMs that bled while on anticoagulation with the decision being made to stop anticoagulation.  Unfortunately with his recent hip fracture he has had a fair amount of pain and was told that Aleve was not like aspirin and has been taking Aleve for the past several weeks.  His stools have been dark.  Suspect that he may well have gastric ulcer 2.  Recent hip fracture 3.    Atrial fibrillation.  Previously on anticoagulation but decision has been made with  Dr. Skains that due to the GI bleeding he will stop the anticoagulation. 4.  Diabetes with diabetic neuropathy 5.  History of aortic stenosis and CHF 6.  History of COPD on home oxygen 7.  OSA 8.  History of prostate cancer  Plan: 1.  We will go ahead and proceed with EGD to evaluate whether here or not he may have developed upper GI ulceration due to NSAID use. 2.  We will go ahead and empirically start him on IV PPI   Anzal Bartnick L Valina Maes 08/31/2017, 7:21 AM   This note was created using voice recognition software and minor errors may Have occurred unintentionally. Pager: [phone] If no answer or after hours call [phone]    

## 2017-08-31 NOTE — Interval H&P Note (Signed)
History and Physical Interval Note:  08/31/2017 7:57 AM  [place]  has presented today for surgery, with the diagnosis of GI bleed  The various methods of treatment have been discussed with the patient and family. After consideration of risks, benefits and other options for treatment, the patient has consented to  Procedure(s): ESOPHAGOGASTRODUODENOSCOPY (EGD) (N/A) as a surgical intervention .  The patient's history has been reviewed, patient examined, no change in status, stable for surgery.  I have reviewed the patient's chart and labs.  Questions were answered to the patient's satisfaction.     Nancy Fetter

## 2017-08-31 NOTE — Anesthesia Preprocedure Evaluation (Signed)
Anesthesia Evaluation  Patient identified by MRN, date of birth, ID band Patient awake    Reviewed: Allergy & Precautions, NPO status , Patient's Chart, lab work & pertinent test results  Airway Mallampati: II       Dental  (+) Edentulous Upper, Edentulous Lower   Pulmonary sleep apnea , COPD, former smoker,    Pulmonary exam normal        Cardiovascular hypertension, Pt. on medications Normal cardiovascular exam+ Valvular Problems/Murmurs AS  Rhythm:Regular Rate:Normal  Echo 06/29/15 -The estimated ejection fraction was   in the range of 55% to 60%. Wall motion was normal; there were no   regional wall motion abnormalities. The study is not technically   sufficient to allow evaluation of LV diastolic function. - Aortic valve: Moderately calcified annulus. Probably trileaflet;   severely calcified leaflets. There was moderate stenosis. Mean   gradient (S): 16 mm Hg. Peak gradient (S): 26 mm Hg   Neuro/Psych PSYCHIATRIC DISORDERS Bipolar Disorder    GI/Hepatic Neg liver ROS, GERD  Medicated,  Endo/Other  diabetes, Type 2, Oral Hypoglycemic Agents  Renal/GU negative Renal ROS     Musculoskeletal   Abdominal (+) + obese,   Peds  Hematology  (+) anemia ,   Anesthesia Other Findings   Reproductive/Obstetrics                             Anesthesia Physical  Anesthesia Plan  ASA: III  Anesthesia Plan: MAC   Post-op Pain Management:    Induction:   PONV Risk Score and Plan: Treatment may vary due to age or medical condition and Ondansetron  Airway Management Planned: Natural Airway and Mask  Additional Equipment:   Intra-op Plan:   Post-operative Plan:   Informed Consent: I have reviewed the patients History and Physical, chart, labs and discussed the procedure including the risks, benefits and alternatives for the proposed anesthesia with the patient or authorized representative  who has indicated his/her understanding and acceptance.   Dental advisory given  Plan Discussed with: CRNA  Anesthesia Plan Comments:         Anesthesia Quick Evaluation

## 2017-09-01 ENCOUNTER — Inpatient Hospital Stay (HOSPITAL_COMMUNITY): Payer: PPO

## 2017-09-01 DIAGNOSIS — R0600 Dyspnea, unspecified: Secondary | ICD-10-CM

## 2017-09-01 DIAGNOSIS — I361 Nonrheumatic tricuspid (valve) insufficiency: Secondary | ICD-10-CM

## 2017-09-01 LAB — COMPREHENSIVE METABOLIC PANEL
ALBUMIN: 3.6 g/dL (ref 3.5–5.0)
ALK PHOS: 60 U/L (ref 38–126)
ALT: 12 U/L (ref 0–44)
AST: 15 U/L (ref 15–41)
Anion gap: 9 (ref 5–15)
BUN: 24 mg/dL — AB (ref 8–23)
CALCIUM: 8.8 mg/dL — AB (ref 8.9–10.3)
CO2: 26 mmol/L (ref 22–32)
CREATININE: 1.31 mg/dL — AB (ref 0.61–1.24)
Chloride: 107 mmol/L (ref 98–111)
GFR calc Af Amer: 57 mL/min — ABNORMAL LOW (ref >60)
GFR calc non Af Amer: 49 mL/min — ABNORMAL LOW (ref >60)
GLUCOSE: 174 mg/dL — AB (ref 70–99)
Potassium: 4.3 mmol/L (ref 3.5–5.1)
SODIUM: 142 mmol/L (ref 135–145)
Total Bilirubin: 0.8 mg/dL (ref 0.3–1.2)
Total Protein: 6.8 g/dL (ref 6.5–8.1)

## 2017-09-01 LAB — GLUCOSE, CAPILLARY
GLUCOSE-CAPILLARY: 168 mg/dL — AB (ref 70–99)
GLUCOSE-CAPILLARY: 190 mg/dL — AB (ref 70–99)
GLUCOSE-CAPILLARY: 177 mg/dL — AB (ref 70–99)
Glucose-Capillary: 133 mg/dL — ABNORMAL HIGH (ref 70–99)

## 2017-09-01 LAB — CBC WITH DIFFERENTIAL/PLATELET
BASOS ABS: 0 10*3/uL (ref 0.0–0.1)
Basophils Relative: 0 %
EOS PCT: 4 %
Eosinophils Absolute: 0.2 10*3/uL (ref 0.0–0.7)
HEMATOCRIT: 27.2 % — AB (ref 39.0–52.0)
HEMOGLOBIN: 8.4 g/dL — AB (ref 13.0–17.0)
Lymphocytes Relative: 9 %
Lymphs Abs: 0.6 10*3/uL — ABNORMAL LOW (ref 0.7–4.0)
MCH: 29.1 pg (ref 26.0–34.0)
MCHC: 30.9 g/dL (ref 30.0–36.0)
MCV: 94.1 fL (ref 78.0–100.0)
MONO ABS: 0.6 10*3/uL (ref 0.1–1.0)
Monocytes Relative: 8 %
NEUTROS ABS: 5.2 10*3/uL (ref 1.7–7.7)
Neutrophils Relative %: 79 %
Platelets: 177 10*3/uL (ref 150–400)
RBC: 2.89 MIL/uL — AB (ref 4.22–5.81)
RDW: 17.6 % — ABNORMAL HIGH (ref 11.5–15.5)
WBC: 6.6 10*3/uL (ref 4.0–10.5)

## 2017-09-01 LAB — TYPE AND SCREEN
ABO/RH(D): O POS
ANTIBODY SCREEN: NEGATIVE
UNIT DIVISION: 0
Unit division: 0

## 2017-09-01 LAB — BPAM RBC
Blood Product Expiration Date: 201909042359
Blood Product Expiration Date: 201909042359
ISSUE DATE / TIME: 201907310134
ISSUE DATE / TIME: 201907302131
UNIT TYPE AND RH: 5100
Unit Type and Rh: 5100

## 2017-09-01 LAB — MAGNESIUM: Magnesium: 2.4 mg/dL (ref 1.7–2.4)

## 2017-09-01 LAB — HEMOGLOBIN AND HEMATOCRIT, BLOOD
HCT: 28.7 % — ABNORMAL LOW (ref 39.0–52.0)
HEMATOCRIT: 28.6 % — AB (ref 39.0–52.0)
HEMOGLOBIN: 9 g/dL — AB (ref 13.0–17.0)
Hemoglobin: 8.9 g/dL — ABNORMAL LOW (ref 13.0–17.0)

## 2017-09-01 LAB — ECHOCARDIOGRAM COMPLETE
Height: 72 in
WEIGHTICAEL: 3840 [oz_av]

## 2017-09-01 LAB — PHOSPHORUS: Phosphorus: 4.2 mg/dL (ref 2.5–4.6)

## 2017-09-01 LAB — BRAIN NATRIURETIC PEPTIDE: B NATRIURETIC PEPTIDE 5: 237.1 pg/mL — AB (ref 0.0–100.0)

## 2017-09-01 MED ORDER — BISACODYL 5 MG PO TBEC
10.0000 mg | DELAYED_RELEASE_TABLET | Freq: Once | ORAL | Status: AC
  Administered 2017-09-01: 10 mg via ORAL
  Filled 2017-09-01: qty 2

## 2017-09-01 MED ORDER — IPRATROPIUM BROMIDE 0.02 % IN SOLN
0.5000 mg | Freq: Four times a day (QID) | RESPIRATORY_TRACT | Status: DC
Start: 2017-09-01 — End: 2017-09-01
  Administered 2017-09-01: 0.5 mg via RESPIRATORY_TRACT
  Filled 2017-09-01: qty 2.5

## 2017-09-01 MED ORDER — LEVALBUTEROL HCL 0.63 MG/3ML IN NEBU
0.6300 mg | INHALATION_SOLUTION | Freq: Four times a day (QID) | RESPIRATORY_TRACT | Status: DC
  Administered 2017-09-01: 0.63 mg via RESPIRATORY_TRACT
  Filled 2017-09-01: qty 3

## 2017-09-01 MED ORDER — SODIUM CHLORIDE 0.9 % IV SOLN: INTRAVENOUS | Status: DC

## 2017-09-01 MED ORDER — PEG 3350-KCL-NA BICARB-NACL 420 G PO SOLR
4000.0000 mL | Freq: Once | ORAL | Status: AC
  Administered 2017-09-01: 4000 mL via ORAL

## 2017-09-01 MED ORDER — FUROSEMIDE 10 MG/ML IJ SOLN
40.0000 mg | Freq: Once | INTRAMUSCULAR | Status: AC
  Administered 2017-09-01: 40 mg via INTRAVENOUS
  Filled 2017-09-01: qty 4

## 2017-09-01 MED ORDER — IPRATROPIUM-ALBUTEROL 0.5-2.5 (3) MG/3ML IN SOLN
3.0000 mL | Freq: Four times a day (QID) | RESPIRATORY_TRACT | Status: DC
  Filled 2017-09-01: qty 3

## 2017-09-01 MED ORDER — LEVALBUTEROL HCL 0.63 MG/3ML IN NEBU
0.6300 mg | INHALATION_SOLUTION | Freq: Two times a day (BID) | RESPIRATORY_TRACT | Status: DC
  Administered 2017-09-01 – 2017-09-16 (×28): 0.63 mg via RESPIRATORY_TRACT
  Filled 2017-09-01 (×31): qty 3

## 2017-09-01 MED ORDER — IPRATROPIUM BROMIDE 0.02 % IN SOLN
0.5000 mg | Freq: Two times a day (BID) | RESPIRATORY_TRACT | Status: DC
  Administered 2017-09-01 – 2017-09-16 (×28): 0.5 mg via RESPIRATORY_TRACT
  Filled 2017-09-01 (×31): qty 2.5

## 2017-09-01 MED ORDER — GUAIFENESIN ER 600 MG PO TB12
1200.0000 mg | ORAL_TABLET | Freq: Two times a day (BID) | ORAL | Status: DC
  Administered 2017-09-01 – 2017-09-16 (×29): 1200 mg via ORAL
  Filled 2017-09-01 (×29): qty 2

## 2017-09-01 NOTE — Progress Notes (Addendum)
PROGRESS NOTE    Joel [name]  GNF:[id number] DOB: Mar 25, 1932 DOA: 08/30/2017 PCP: Wenda Low, MD   Brief Narrative: Joel [name] is a 84 y.o. male with medical history significant for CAD, Afib not on anticoagulation due to history of recurrent GI bleed felt to be from small gastric AVMs causing iron deficiency anemia. He required several transfusions last year, but has since been doing well. He has been following up with Hematology but has not followed up there since March 2019 it seems. The plan was for him to follow up monthlly with transfusions to keep Hb above 9. He has been doing well, but in the last 2-3 weeks he noted some abdominal bloating as well as weaness. Yesterday he want for a bone density scan and asked that his hemoglobin be checked, he was called to come to ER today due to Hb of 7. Denies nausea, abdominal pain, reflux, or BRBPR. He has very dark stools but he takes iron BID.  He has had some SOB with exertion, but no chest pain, dizziness or syncope.  In the ER repeat lab confirmed Hb 7, two units of blood have been ordered and Hemoccult was positive for blood. Hospitalist was asked to admit, and a call has been placed to GI for inpatient consultation. Patient underwent EGD which showed Gastritis, Duodenitis, and Esophageal Candidiasis. Repeat Hb improved to 9.0 but dropped again so GI planning on Colonoscopy in AM. Patient continues to complain of Dyspnea so was given IV lasix and started on Breathing treatments. ECHOCardiogram being checked.    Assessment & Plan:   Active Problems:   DM2 (diabetes mellitus, type 2) ([place])   Pulmonary hypertension (HCC)   Hypertension   Chronic GI bleeding   Persistent atrial fibrillation (HCC)   Blood loss anemia  Acute on Chronic Blood Loss Anemia -Admitted as Inpatient -Possibly 2/2 to NSAID use for Recent Hip Fx -Hb/Hct on Admission was 7.3/24.2; Typed and Screened and Transfused 2 units of pRBC's and Hb/Hct trended up but  then dropped again to 8.4/27.2; Repeat Hb/Hct later this AM was 9.0/28.7 [facility] consulted for further evaluation and recommendations and underwent EGD which showed Diffuse moderate inflammation characterized by erosions, erythema and friability was found in the entire examined stomach.  Localized mild inflammation characterized by erosions and erythema was found in the duodenal bulb -Continue to Monitor for S/Sx of Bleeding -C/w Iron Polysaccharides 150 mg po BID -Protonix 40 mg po Daily increased to 40 mg po BID by Gastroenterology  -Continue to Monitor Hb/Hct q8h -GI to take the patient for a colonoscopy in the AM as his Hb/Hc was dropping and because he has not had a colonoscopy in 5 years.  DMType2 complicated by Diabetic Neuropathy in feet -HbA1c was 6.0  -Continue to Monitor CBG's -Hold Metformin 1000 mg po BID and Glimepiride 1 mg po Daily with Breakfast  -C/w Gabapentin  -Continued  Moderate Novolog SSI q4h while NPO for possible GI Intervention but will change to Moderate AC/HS now that patient is on a Heart Healthy/Carb Modified Diet  -CBG's ranging from 168-190; Continue to Monitor CBG's closely   pHTN -C/w Lasix 40 mg Daily; Will need to Verify Dose; Will give IV Lasix today for Dyspnea   HTN -C/w Diltiazem 120 mg po Daily, Hold Losartan 25 mg po Daily for now   Chronic GIB -As Above   Persistent Atrial Fibrillation -s/p Multiple DCCV -C/w Diltiazem 120 mg po Daily -CHADS2VASc Elevated of 4 but not on Anticoagulation  due chronic GIB -Per Cardiology should have ben on Baby ASA but stopped.   Aortic Stenosis  -Was Moderate on last ECHO at Cardiology office -Per Cardiology monitor clinically and follow with repeat [facility] though he is a poor candidate in the future even probably for TAVR -Repeat ECHO this Visit   COPD on Home O2 -C/w 2 liters of O2 via [place]; Was not wearing it so desaturated -May have a mild Exacerbation because he felt dyspneic so will  start patient on Xopenex/Atrovent q6h and place on Guaifenesin 1200 mg po BID  -C/w Dextromethorphan-Guaifenesin 30-600 mg po q12h  Bipolar Affective Disorder -C/w Citalopram but to 20 mg po Daily and Bupropion 150 mg po Daily  -C/w Temazepam 15 mg po qHS  OSA -Ordered CPAP -Was placed on BiPAP 16/12 with 2 Liters of O2 Humidified overnight   Esophageal Candidiasis -Noted on EGD with plaques consistent with Candidiasis -Started on Fluconazole 100 mg po TID x3 days per GI  HLD/Dyslipidemia  -C/w Lovastatin 20 mg po Daily qHS   Gout -C/w Home Allopurinol 100 mg po Daily   Recent Right Hip Fracture -PT/OT to Evaluate and Treat -X-Ray on 06/07/17 showed: "Standing AP pelvis and frog-leg lateral x-ray demonstrates cannulated screw fixation femoral neck fracture with slight backout of cannulated screws unchanged from March 2019. No evidence of AVN and no displacement with interval healing of the femoral neck fracture.  Impression: Satisfactory cannulated screw fixation femoral neck fracture  with good position." -Dr Lorin Mercy recommended Ambulation and LE Strengthening   CKD Stage 3 -Cr appears to be elevated since the beginning of the year -Maybe new Baseline; BUN/Cr now  -BUN/Cr stable compared to prior values this year -Avoid Nephrotoxic Medications if possible -Repeat CMP in AM   BPPV -C/w Home Meclizine 25 mg po TIDprn  Dyspnea -Likely Multifactorial and in the setting of COPD, Anemia and mild volume overload -Bronchodilators as above -Check ECHOCardiogram and Chest X-Ray -Flutter Valve, Incentive Spirometry, and Guaifenesin  DVT prophylaxis: SCDs given concern for Blood Loss Anemia Code Status: FULL CODE Family Communication: No family present at bedside Disposition Plan: Pending Further workup and GI Clearance   Consultants:   [facility]    Procedures:  EGD Findings:      Diffuse, white plaques were found in the mid esophagus.      Diffuse moderate  inflammation characterized by erosions, erythema and       friability was found in the entire examined stomach.      Localized mild inflammation characterized by erosions and erythema was       found in the duodenal bulb. Impression:               - Esophageal plaques were found, consistent with                            candidiasis. Due to the patient's history of                            diabetes and the fact that he had been bleeding                            this was not biopsy                           - Gastritis.                           -  Duodenitis.                           - No specimens collected.  ECHOCARDIOGRAM -ordered and pending    Antimicrobials:  Anti-infectives (From admission, onward)   Start     Dose/Rate Route Frequency Ordered Stop   08/31/17 1100  fluconazole (DIFLUCAN) tablet 100 mg     100 mg Oral Daily 08/31/17 0921 09/03/17 0959     Subjective: Seen and examined at bedside and nurse stated he desaturated this AM and was a little more somnolent. Patient worried about where his Hb was going. Also complained of some Dyspnea. No CP, SOB, Nausea, or Vomiting. No other concerns or complaints at this time and wanted to sit in the chair again.   Objective: Vitals:   08/31/17 1501 08/31/17 2035 09/01/17 0514 09/01/17 0820  BP: 131/73 (!) 127/115 (!) 147/61   Pulse: 70 68 76   Resp: 14 20 (!) 24   Temp: 98.3 F (36.8 C) 97.8 F (36.6 C) 99.9 F (37.7 C)   TempSrc:  Oral Oral   SpO2: 99% 100% (!) 89% 95%  Weight:      Height:        Intake/Output Summary (Last 24 hours) at 09/01/2017 1440 Last data filed at 09/01/2017 1300 Gross per 24 hour  Intake 840 ml  Output 950 ml  Net -110 ml   Filed Weights   08/30/17 1720 08/30/17 1724 08/30/17 2240  Weight: 108.9 kg (240 lb) 108.9 kg (240 lb) 108.9 kg (240 lb)   Examination: Physical Exam:  Constitutional: Well-nourished, well-developed obese Caucasian male who is currently no acute distress but  feels a little bit dyspneic and wants to sit in the chair again Eyes: Sclera anicteric.  Lids and conjunctive are normal ENMT: Nose appear normal slightly hard of hearing Neck: Appears supple with no JVD Respiratory: Diminished to auscultation bilaterally no appreciable wheezing, rales, rhonchi. Cardiovascular: Irregularly irregular.  Has a 3 out of 6 diastolic murmur.  1+ lower extremity edema noted Abdomen: Soft, nontender, slightly distended secondary body habitus.  Bowel sounds present all 4 quadrants GU: Deferred Musculoskeletal: No contractures or cyanosis.  No joint deformities noted Skin: Skin is warm and dry with no appreciable rashes or lesions limited skin evaluation Neurologic: Cranial nerves II through XII grossly intact no appreciable focal deficits Psychiatric: Slightly anxious mood but normal affect.  Intact judgment and insight.  Patient is awake and alert and oriented x3  Data Reviewed: I have personally reviewed following labs and imaging studies  CBC: Recent Labs  Lab 08/30/17 1744 08/31/17 0647 08/31/17 1127 08/31/17 1709 09/01/17 0037 09/01/17 0825  WBC 5.1 5.6  --   --  6.6  --   NEUTROABS  --   --   --   --  5.2  --   HGB 7.3* 8.9* 8.9* 9.0* 8.4* 9.0*  HCT 24.2* 27.9*  --  29.2* 27.2* 28.7*  MCV 96.4 94.3  --   --  94.1  --   PLT 196 168  --   --  177  --    Basic Metabolic Panel: Recent Labs  Lab 08/30/17 1744 08/31/17 0647 09/01/17 0037  NA 141 142 142  K 4.4 4.5 4.3  CL 106 107 107  CO2 26 25 26   GLUCOSE 186* 141* 174*  BUN 26* 25* 24*  CREATININE 1.76* 1.40* 1.31*  CALCIUM 9.3 9.4 8.8*  MG  --   --  2.4  PHOS  --   --  4.2   GFR: Estimated Creatinine Clearance: 55.4 mL/min (A) (by C-G formula based on SCr of 1.31 mg/dL (H)). Liver Function Tests: Recent Labs  Lab 09/01/17 0037  AST 15  ALT 12  ALKPHOS 60  BILITOT 0.8  PROT 6.8  ALBUMIN 3.6   No results for input(s): LIPASE, AMYLASE in the last 168 hours. No results for  input(s): AMMONIA in the last 168 hours. Coagulation Profile: Recent Labs  Lab 08/30/17 2135  INR 1.07   Cardiac Enzymes: No results for input(s): CKTOTAL, CKMB, CKMBINDEX, TROPONINI in the last 168 hours. BNP (last 3 results) No results for input(s): PROBNP in the last 8760 hours. HbA1C: Recent Labs    08/30/17 1744  HGBA1C 6.0*   CBG: Recent Labs  Lab 08/31/17 1614 08/31/17 2047 08/31/17 2348 09/01/17 0753 09/01/17 1214  GLUCAP 135* 162* 174* 168* 190*   Lipid Profile: No results for input(s): CHOL, HDL, LDLCALC, TRIG, CHOLHDL, LDLDIRECT in the last 72 hours. Thyroid Function Tests: No results for input(s): TSH, T4TOTAL, FREET4, T3FREE, THYROIDAB in the last 72 hours. Anemia Panel: No results for input(s): VITAMINB12, FOLATE, FERRITIN, TIBC, IRON, RETICCTPCT in the last 72 hours. Sepsis Labs: No results for input(s): PROCALCITON, LATICACIDVEN in the last 168 hours.  No results found for this or any previous visit (from the past 240 hour(s)).   Radiology Studies: No results found.   Scheduled Meds: . allopurinol  100 mg Oral Daily  . bisacodyl  10 mg Oral Once  . buPROPion  150 mg Oral Daily  . cholecalciferol  1,000 Units Oral Daily  . citalopram  20 mg Oral Daily  . diclofenac sodium  4 g Topical QID  . diltiazem  120 mg Oral Daily  . fluconazole  100 mg Oral Daily  . furosemide  40 mg Oral Daily  . guaiFENesin  1,200 mg Oral BID  . insulin aspart  0-15 Units Subcutaneous TID WC  . insulin aspart  0-5 Units Subcutaneous QHS  . ipratropium-albuterol  3 mL Nebulization Q6H  . iron polysaccharides  150 mg Oral BID  . pantoprazole  40 mg Oral BID  . polyethylene glycol-electrolytes  4,000 mL Oral Once  . potassium chloride SA  20 mEq Oral Daily  . pravastatin  20 mg Oral q1800  . senna-docusate  2 tablet Oral BID  . temazepam  15 mg Oral QHS   Continuous Infusions:   LOS: 2 days   Kerney Elbe, DO [facility] Pager [phone]  If  7PM-7AM, please contact night-coverage [email or web address] Password [email or web address] 09/01/2017, 2:40 PM

## 2017-09-01 NOTE — Progress Notes (Signed)
[facility] PROGRESS NOTE Subjective Patient without gross bleeding but hemoglobin is dropped overnight from 9-8.4.  Review of his records indicates that his last colonoscopy was 5 years ago.  Objective: Vital signs in last 24 hours: Temp:  [97.6 F (36.4 C)-99.9 F (37.7 C)] 99.9 F (37.7 C) (08/01 0514) Pulse Rate:  [68-88] 76 (08/01 0514) Resp:  [14-30] 24 (08/01 0514) BP: (127-147)/(61-115) 147/61 (08/01 0514) SpO2:  [89 %-100 %] 89 % (08/01 0514) Last BM Date: 08/29/17  Intake/Output from previous day: 07/31 0701 - 08/01 0700 In: 702 [P.O.:702] Out: 1050 [Urine:1050] Intake/Output this shift: No intake/output data recorded.  PE: General--lying in bed using BiPAP  Abdomen--obese but soft and nontender  Lab Results: Recent Labs    08/30/17 1744 08/31/17 0647 08/31/17 1127 08/31/17 1709 09/01/17 0037  WBC 5.1 5.6  --   --  6.6  HGB 7.3* 8.9* 8.9* 9.0* 8.4*  HCT 24.2* 27.9*  --  29.2* 27.2*  PLT 196 168  --   --  177   BMET Recent Labs    08/30/17 1744 08/31/17 0647 09/01/17 0037  NA 141 142 142  K 4.4 4.5 4.3  CL 106 107 107  CO2 26 25 26   CREATININE 1.76* 1.40* 1.31*   LFT Recent Labs    09/01/17 0037  PROT 6.8  AST 15  ALT 12  ALKPHOS 60  BILITOT 0.8   PT/INR Recent Labs    08/30/17 2135  LABPROT 13.9  INR 1.07   PANCREAS No results for input(s): LIPASE in the last 72 hours.       Studies/Results: No results found.  Medications: I have reviewed the patient's current medications.  Assessment:   1.  Anemia/positive stools.  Patient has had an extensive work-up with multiple EGDs, small bowel enteroscopy etc.  He was taking some Aleve had minimal gastritis on EGD but is continuing to drop his hemoglobin.  Since he has not had a colonoscopy in 5 years I think we should go ahead and do that on this admission to make sure that there is nothing going on in his colon.  He is agreeable given the fact that he has had a recent  hip surgery and is just completing rehab I hate to try to put him through a colonoscopy prep as an outpatient.  He did have a small adenoma removed on his previous colonoscopy.   Plan: We will plan on colonoscopy tomorrow.  I have explained to him that Dr. Cristina Gong will do the procedure.   Nancy Fetter 09/01/2017, 7:54 AM  This note was created using voice recognition software. Minor errors may Have occurred unintentionally.  Pager: [phone] If no answer or after hours call [phone]

## 2017-09-01 NOTE — Progress Notes (Signed)
Patient wishes to hold the use of nocturnal PAP tonight due to his being up in the chair and drinking bowel prep. RT will continue to follow

## 2017-09-01 NOTE — Progress Notes (Signed)
PT demonstrates hands on understanding of Flutter device. 

## 2017-09-01 NOTE — Progress Notes (Signed)
  Echocardiogram 2D Echocardiogram has been performed.  Joel [name] 09/01/2017, 11:32 AM

## 2017-09-02 ENCOUNTER — Encounter (HOSPITAL_COMMUNITY): Payer: Self-pay | Admitting: Certified Registered Nurse Anesthetist

## 2017-09-02 ENCOUNTER — Inpatient Hospital Stay (HOSPITAL_COMMUNITY): Payer: PPO

## 2017-09-02 ENCOUNTER — Inpatient Hospital Stay (HOSPITAL_COMMUNITY): Payer: PPO | Admitting: Certified Registered Nurse Anesthetist

## 2017-09-02 ENCOUNTER — Encounter (HOSPITAL_COMMUNITY)
Admission: EM | Disposition: A | Discharge status: Home Health | Payer: Self-pay | Source: Home / Self Care | Attending: Internal Medicine

## 2017-09-02 DIAGNOSIS — K639 Disease of intestine, unspecified: Secondary | ICD-10-CM

## 2017-09-02 HISTORY — PX: POLYPECTOMY: SHX5525

## 2017-09-02 HISTORY — PX: BIOPSY: SHX5522

## 2017-09-02 HISTORY — PX: COLONOSCOPY: SHX5424

## 2017-09-02 LAB — GLUCOSE, CAPILLARY
Glucose-Capillary: 136 mg/dL — ABNORMAL HIGH (ref 70–99)
Glucose-Capillary: 142 mg/dL — ABNORMAL HIGH (ref 70–99)
Glucose-Capillary: 126 mg/dL — ABNORMAL HIGH (ref 70–99)
Glucose-Capillary: 163 mg/dL — ABNORMAL HIGH (ref 70–99)

## 2017-09-02 LAB — CBC WITH DIFFERENTIAL/PLATELET
Basophils Absolute: 0 10*3/uL (ref 0.0–0.1)
Basophils Relative: 1 %
EOS PCT: 5 %
Eosinophils Absolute: 0.2 10*3/uL (ref 0.0–0.7)
HCT: 28.4 % — ABNORMAL LOW (ref 39.0–52.0)
Hemoglobin: 8.9 g/dL — ABNORMAL LOW (ref 13.0–17.0)
LYMPHS ABS: 0.6 10*3/uL — AB (ref 0.7–4.0)
LYMPHS PCT: 14 %
MCH: 29.8 pg (ref 26.0–34.0)
MCHC: 31.3 g/dL (ref 30.0–36.0)
MCV: 95 fL (ref 78.0–100.0)
MONO ABS: 0.4 10*3/uL (ref 0.1–1.0)
MONOS PCT: 9 %
Neutro Abs: 3.2 10*3/uL (ref 1.7–7.7)
Neutrophils Relative %: 71 %
PLATELETS: 188 10*3/uL (ref 150–400)
RBC: 2.99 MIL/uL — AB (ref 4.22–5.81)
RDW: 17.1 % — AB (ref 11.5–15.5)
WBC: 4.5 10*3/uL (ref 4.0–10.5)

## 2017-09-02 LAB — HEMOGLOBIN AND HEMATOCRIT, BLOOD
HCT: 27.7 % — ABNORMAL LOW (ref 39.0–52.0)
HEMATOCRIT: 29.1 % — AB (ref 39.0–52.0)
Hemoglobin: 8.7 g/dL — ABNORMAL LOW (ref 13.0–17.0)
Hemoglobin: 9 g/dL — ABNORMAL LOW (ref 13.0–17.0)

## 2017-09-02 LAB — PHOSPHORUS: PHOSPHORUS: 3.9 mg/dL (ref 2.5–4.6)

## 2017-09-02 LAB — COMPREHENSIVE METABOLIC PANEL
ALT: 12 U/L (ref 0–44)
AST: 13 U/L — ABNORMAL LOW (ref 15–41)
Albumin: 3.6 g/dL (ref 3.5–5.0)
Alkaline Phosphatase: 59 U/L (ref 38–126)
Anion gap: 8 (ref 5–15)
BUN: 21 mg/dL (ref 8–23)
CHLORIDE: 107 mmol/L (ref 98–111)
CO2: 30 mmol/L (ref 22–32)
CREATININE: 1.18 mg/dL (ref 0.61–1.24)
Calcium: 9.2 mg/dL (ref 8.9–10.3)
GFR, EST NON AFRICAN AMERICAN: 56 mL/min — AB (ref >60)
Glucose, Bld: 140 mg/dL — ABNORMAL HIGH (ref 70–99)
Potassium: 4.2 mmol/L (ref 3.5–5.1)
Sodium: 145 mmol/L (ref 135–145)
Total Bilirubin: 0.9 mg/dL (ref 0.3–1.2)
Total Protein: 7.1 g/dL (ref 6.5–8.1)

## 2017-09-02 LAB — MAGNESIUM: MAGNESIUM: 2.3 mg/dL (ref 1.7–2.4)

## 2017-09-02 SURGERY — COLONOSCOPY
Anesthesia: Monitor Anesthesia Care

## 2017-09-02 MED ORDER — PHENYLEPHRINE 40 MCG/ML (10ML) SYRINGE FOR IV PUSH (FOR BLOOD PRESSURE SUPPORT)
PREFILLED_SYRINGE | INTRAVENOUS | Status: DC | PRN
  Administered 2017-09-02: 80 ug via INTRAVENOUS

## 2017-09-02 MED ORDER — IOPAMIDOL (ISOVUE-300) INJECTION 61%
INTRAVENOUS | Status: AC
Start: 2017-09-02 — End: 2017-09-03
  Filled 2017-09-02: qty 100

## 2017-09-02 MED ORDER — PROPOFOL 10 MG/ML IV BOLUS
INTRAVENOUS | Status: AC
  Filled 2017-09-02: qty 60

## 2017-09-02 MED ORDER — PROPOFOL 500 MG/50ML IV EMUL
INTRAVENOUS | Status: DC | PRN
  Administered 2017-09-02: 100 ug/kg/min via INTRAVENOUS

## 2017-09-02 MED ORDER — LIDOCAINE 2% (20 MG/ML) 5 ML SYRINGE
INTRAMUSCULAR | Status: DC | PRN
  Administered 2017-09-02: 100 mg via INTRAVENOUS

## 2017-09-02 MED ORDER — IOPAMIDOL (ISOVUE-300) INJECTION 61%
INTRAVENOUS | Status: AC
  Filled 2017-09-02: qty 30

## 2017-09-02 MED ORDER — IOPAMIDOL (ISOVUE-300) INJECTION 61%
100.0000 mL | Freq: Once | INTRAVENOUS | Status: AC | PRN
  Administered 2017-09-02: 100 mL via INTRAVENOUS

## 2017-09-02 MED ORDER — ONDANSETRON HCL 4 MG/2ML IJ SOLN: 4.0000 mg | Freq: Once | INTRAMUSCULAR | Status: DC | PRN

## 2017-09-02 MED ORDER — IOPAMIDOL (ISOVUE-300) INJECTION 61%
30.0000 mL | Freq: Once | INTRAVENOUS | Status: AC | PRN
  Administered 2017-09-02: 30 mL via ORAL

## 2017-09-02 MED ORDER — ENSURE SURGERY PO LIQD
237.0000 mL | Freq: Two times a day (BID) | ORAL | Status: DC
  Administered 2017-09-04 (×2): 237 mL via ORAL
  Filled 2017-09-02 (×6): qty 237

## 2017-09-02 MED ORDER — LACTATED RINGERS IV SOLN
INTRAVENOUS | Status: DC | PRN
  Administered 2017-09-02: 12:00:00 via INTRAVENOUS

## 2017-09-02 MED ORDER — PROPOFOL 10 MG/ML IV BOLUS
INTRAVENOUS | Status: DC | PRN
  Administered 2017-09-02: 40 mg via INTRAVENOUS

## 2017-09-02 NOTE — Progress Notes (Signed)
PROGRESS NOTE    Joel [name]  PVX:[id number] DOB: 10-Aug-1932 DOA: 08/30/2017 PCP: Wenda Low, MD   Brief Narrative: Joel [name] is a 84 y.o. male with medical history significant for CAD, Afib not on anticoagulation due to history of recurrent GI bleed felt to be from small gastric AVMs causing iron deficiency anemia. He required several transfusions last year, but has since been doing well. He has been following up with Hematology but has not followed up there since March 2019 it seems. The plan was for him to follow up monthlly with transfusions to keep Hb above 9. He has been doing well, but in the last 2-3 weeks he noted some abdominal bloating as well as weaness. Yesterday he want for a bone density scan and asked that his hemoglobin be checked, he was called to come to ER today due to Hb of 7. Denies nausea, abdominal pain, reflux, or BRBPR. He has very dark stools but he takes iron BID.  He has had some SOB with exertion, but no chest pain, dizziness or syncope.  In the ER repeat lab confirmed Hb 7, two units of blood have been ordered and Hemoccult was positive for blood. Hospitalist was asked to admit, and a call has been placed to GI for inpatient consultation. Patient underwent EGD which showed Gastritis, Duodenitis, and Esophageal Candidiasis. Repeat Hb improved to 9.0 but dropped again so GI did colonoscopy which showed a Cecal Mass so General Surgery was consulted.   Patient complained of Dyspnea so was given IV lasix on 09/01/17 and started on Breathing treatments with improvement ECHOCardiogram being checked.    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  Localized mild inflammation characterized by erosions and erythema was found in the duodenal bulb -Continue to Monitor for S/Sx of Bleeding -C/w Iron Polysaccharides 150 mg po BID -Protonix 40 mg po Daily increased to 40 mg po BID by Gastroenterology  -Continue to Monitor Hb/Hct q8h -GI took the patient for colonoscopy this AM as his Hb/Hc was dropping and because he has not had a colonoscopy in 5 years. -Colonoscopy revealed an ulcerated, likely malignant non-obstructing medium-sized mass in the cecum and a 6 mm polyp in the rectum that was sessile along with sigmoid colon diverticula -GI feels cecal mass is strongly suggestive of cancer and likely accounts for chronic blood loss -Continue to Monitor and repeat CBC in AM   DMType2 complicated by Diabetic Neuropathy in feet -HbA1c was 6.0  -Continue to Monitor CBG's -Hold Metformin 1000 mg po BID and Glimepiride 1 mg po Daily with Breakfast  -C/w Gabapentin  -Continued  Moderate Novolog SSI q4h while NPO for possible GI Intervention but will change to Moderate AC/HS now that patient is on a Heart Healthy/Carb Modified Diet  -CBG's ranging from 133-177; Continue to Monitor CBG's closely   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 -Check  CT Chest/Abd/Pelvis with Contrast to evaluate for Mets  -GI consulted General Surgery who is to evaluate   pHTN -C/w Lasix 40 mg Daily; Will need to Verify Dose; Given IV Lasix yesterday for Dyspnea with improvement   HTN -C/w Diltiazem 120 mg po Daily, Hold Losartan 25 mg po Daily for now currently that he got diuresed and will get IV Contrast    Chronic GIB -As Above;  Likely from Cecal Mass   Persistent Atrial Fibrillation -s/p Multiple DCCV -C/w Diltiazem 120 mg po Daily -CHADS2VASc Elevated of 4 but not on Anticoagulation due chronic GIB -Per Cardiology should have ben on Baby ASA but stopped.   Aortic Stenosis  -Was Moderate on last ECHO at Cardiology office -Per Cardiology monitor clinically and follow with repeat [facility] though he is a poor candidate in the future even probably for TAVR -Repeat ECHO this Visit as below   COPD on Home O2 -C/w 2 liters of O2 via [place]; Was not wearing it so desaturated -May have a mild Exacerbation because he felt dyspneic so will start patient on Xopenex/Atrovent q6h and place on Guaifenesin 1200 mg po BID  -C/w Dextromethorphan-Guaifenesin 30-600 mg po q12h  Bipolar Affective Disorder -C/w Citalopram but to 20 mg po Daily and Bupropion 150 mg po Daily  -C/w Temazepam 15 mg po qHS  OSA -Ordered CPAP -Was placed on BiPAP 16/12 with 2 Liters of O2 Humidified overnight   Esophageal Candidiasis -Noted on EGD with plaques consistent with Candidiasis -Started on Fluconazole 100 mg po TID x3 days per GI and is to stop today   HLD/Dyslipidemia  -C/w Lovastatin 20 mg po Daily qHS   Gout -C/w Home Allopurinol 100 mg po Daily   Recent Right Hip Fracture -PT/OT to Evaluate and Treat -X-Ray on 06/07/17 showed: "Standing AP pelvis and frog-leg lateral x-ray demonstrates cannulated screw fixation femoral neck fracture with slight backout of cannulated screws unchanged from March 2019. No evidence of AVN and no displacement with interval healing of the femoral neck fracture.  Impression: Satisfactory cannulated screw fixation femoral neck fracture  with good position." -Dr Lorin Mercy recommended Ambulation and LE Strengthening   CKD Stage 3 -Cr appears to be elevated since the beginning of the year -Maybe new Baseline; BUN/Cr now 21/1.18 -BUN/Cr stable compared to prior values this year -Avoid Nephrotoxic Medications if  possible and monitor closely given that patient is to receive contrast dye for CT Scans  -Repeat CMP in AM   BPPV -C/w Home Meclizine 25 mg po TIDprn  Dyspnea, improved  -Likely Multifactorial and in the setting of COPD, Anemia and mild volume overload -Bronchodilators as above -Check ECHOCardiogram and Chest X-Ray -ECHOCardiogram showed the estimated  ejection fraction was in the range of 55% to 60%. The study is not technically sufficient to allow evaluation of LV diastolic function. -CXR showed Cardiomegaly and mild pulmonary edema. -Given IV Lasix 40 mg x1 09/01/17 and will continue home Lasix 40 mg po daily  -Flutter Valve, Incentive Spirometry, and Guaifenesin  DVT prophylaxis: SCDs given concern for Blood Loss Anemia Code Status: FULL CODE Family Communication: No family present at bedside Disposition Plan: Pending Further workup and evaluation by General Surgery  Consultants:   [facility] Gastroenterology (signed off)  General Surgery   Procedures:  EGD Findings:      Diffuse, white plaques were found in the mid esophagus.      Diffuse moderate inflammation characterized by erosions, erythema and       friability was found in  the entire examined stomach.      Localized mild inflammation characterized by erosions and erythema was       found in the duodenal bulb. Impression:               - Esophageal plaques were found, consistent with                            candidiasis. Due to the patient's history of                            diabetes and the fact that he had been bleeding                            this was not biopsy                           - Gastritis.                           - Duodenitis.                           - No specimens collected.  ECHOCARDIOGRAM ------------------------------------------------------------------- Study Conclusions  - Left ventricle: Wall thickness was increased in a pattern of   moderate LVH. Systolic function was normal. The  estimated   ejection fraction was in the range of 55% to 60%. The study is   not technically sufficient to allow evaluation of LV diastolic   function. - Aortic valve: There was moderate stenosis. Valve area (VTI): 1.57   cm^2. Valve area (Vmax): 1.74 cm^2. Valve area (Vmean): 1.53   cm^2. - Mitral valve: Moderately calcified annulus. Mildly thickened,   mildly calcified leaflets . There was mild regurgitation. Valve   area by continuity equation (using LVOT flow): 2.59 cm^2. - Left atrium: The atrium was moderately dilated. - Atrial septum: No defect or patent foramen ovale was identified. - Pulmonary arteries: PA peak pressure: 65 mm Hg (S). - Impressions: Gradients have increased since 2017 when they were   mean 16 mmHg and peak 26 mmHg.  Impressions:  - Gradients have increased since 2017 when they were mean 16 mmHg   and peak 26 mmHg.  COLONOSCOPY Findings:      The digital rectal exam findings include flat prostate bed.      An ulcerated non-obstructing medium-sized MASS was found in the cecum.       The mass was non-circumferential. No bleeding was present. This was       biopsied with a cold forceps for histology. Estimated blood loss was       minimal.      A 6 mm polyp was found in the rectum (benign-appearing lesion). The       polyp was sessile. The polyp was removed with a cold snare. Resection       and retrieval were complete. Estimated blood loss: 3 mL.      Multiple large-mouthed diverticula were found in the sigmoid colon.      The exam was otherwise normal throughout the examined colon.      The terminal ileum appeared normal.      The retroflexed view of the distal rectum and anal verge was normal and  showed no anal or rectal abnormalities. Impression:               - Flat prostate bed found on digital rectal exam.                           - LIKELY MALIGNANT MASS in the cecum. Biopsied.                            This could well account for patient's                             recurrent/ongoing low grade blood loss.                           - One benign appearing 6 mm polyp in the rectum,                            removed with a cold snare. Resected and retrieved.                           - Diverticulosis in the sigmoid colon.                           - The examined portion of the ileum was normal.                           - The distal rectum and anal verge are normal on                            retroflexion view.   Antimicrobials:  Anti-infectives (From admission, onward)   Start     Dose/Rate Route Frequency Ordered Stop   08/31/17 1100  fluconazole (DIFLUCAN) tablet 100 mg     100 mg Oral Daily 08/31/17 0921 09/03/17 0959     Subjective: Seen and examined at bedside prior to going down for his colonoscopy.  States his dyspnea is improved significantly and states his legs are "skinny."  No chest pain, short of breath, nausea, vomiting.  Ready to go down for his, and has no other complaints or or concerns at this time.   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 Eyes: Lids and conjunctive are normal.  Sclera anicteric ENMT: External ears and nose appear normal  Neck: Appears supple with no JVD Respiratory: Diminished to auscultation bilaterally no appreciable  wheezing, rales, rhonchi.  Not wearing supplemental oxygen via nasal cannula now and has unlabored breathing Cardiovascular: Irregularly irregular.  Has a 3/6 systolic murmur.  Has trace lower extremity edema noted Abdomen: Soft, nontender, distended slightly secondary body habitus.  Bowel sounds present all 4 quadrants GU: Deferred Musculoskeletal: No contractures or cyanosis.  No joint deformities noted Skin: Skin is warm and dry with no appreciable rashes or lesions on limited skin evaluation Neurologic: Cranial nerves II through XII grossly intact no appreciable focal deficits Psychiatric: Normal mood and affect.  Intact and insight.  Patient is awake and alert and oriented x3  Data Reviewed: I have personally reviewed following labs and imaging studies  CBC: Recent Labs  Lab 08/30/17 1744 08/31/17 0647  09/01/17 0037 09/01/17 0825 09/01/17 1744 09/02/17 0054 09/02/17 0804  WBC 5.1 5.6  --  6.6  --   --  4.5  --   NEUTROABS  --   --   --  5.2  --   --  3.2  --   HGB 7.3* 8.9*   < > 8.4* 9.0* 8.9* 8.9* 8.7*  HCT 24.2* 27.9*   < > 27.2* 28.7* 28.6* 28.4* 27.7*  MCV 96.4 94.3  --  94.1  --   --  95.0  --   PLT 196 168  --  177  --   --  188  --    < > = values in this interval not displayed.   Basic Metabolic Panel: Recent Labs  Lab 08/30/17 1744 08/31/17 0647 09/01/17 0037 09/02/17 0054  NA 141 142 142 145  K 4.4 4.5 4.3 4.2  CL 106 107 107 107  CO2 26 25 26 30   GLUCOSE 186* 141* 174* 140*  BUN 26* 25* 24* 21  CREATININE 1.76* 1.40* 1.31* 1.18  CALCIUM 9.3 9.4 8.8* 9.2  MG  --   --  2.4 2.3  PHOS  --   --  4.2 3.9   GFR: Estimated Creatinine Clearance: 61.5 mL/min (by C-G formula based on SCr of 1.18 mg/dL). Liver Function Tests: Recent Labs  Lab 09/01/17 0037 09/02/17 0054  AST 15 13*  ALT 12 12  ALKPHOS 60 59  BILITOT 0.8 0.9  PROT 6.8 7.1  ALBUMIN 3.6 3.6   No results for input(s): LIPASE, AMYLASE in the last 168 hours. No results for input(s):  AMMONIA in the last 168 hours. Coagulation Profile: Recent Labs  Lab 08/30/17 2135  INR 1.07   Cardiac Enzymes: No results for input(s): CKTOTAL, CKMB, CKMBINDEX, TROPONINI in the last 168 hours. BNP (last 3 results) No results for input(s): PROBNP in the last 8760 hours. HbA1C: Recent Labs    08/30/17 1744  HGBA1C 6.0*   CBG: Recent Labs  Lab 09/01/17 0753 09/01/17 1214 09/01/17 1644 09/01/17 2058 09/02/17 0745  GLUCAP 168* 190* 177* 133* 136*   Lipid Profile: No results for input(s): CHOL, HDL, LDLCALC, TRIG, CHOLHDL, LDLDIRECT in the last 72 hours. Thyroid Function Tests: No results for input(s): TSH, T4TOTAL, FREET4, T3FREE, THYROIDAB in the last 72 hours. Anemia Panel: No results for input(s): VITAMINB12, FOLATE, FERRITIN, TIBC, IRON, RETICCTPCT in the last 72 hours. Sepsis Labs: No results for input(s): PROCALCITON, LATICACIDVEN in the last 168 hours.  No results found for this or any previous visit (from the past 240 hour(s)).   Radiology Studies: Dg Chest Port 1 View  Result Date: 09/01/2017 CLINICAL DATA:  84 y.o. male with medical history significant for CAD, Afib not on anticoagulation due to history of recurrent GI bleed felt to be from small gastric AVMs causing iron deficiency anemia. EXAM: PORTABLE CHEST 1 VIEW COMPARISON:  03/22/2017 FINDINGS: Median sternotomy. The heart is enlarged. There is perihilar airspace filling and prominent interstitial markings consistent with pulmonary edema. No focal consolidations. IMPRESSION: Cardiomegaly and mild pulmonary edema. Electronically Signed   By: Nolon Nations M.D.   On: 09/01/2017 16:18     Scheduled Meds: . [MAR Hold] allopurinol  100 mg Oral Daily  . [MAR Hold] buPROPion  150 mg Oral Daily  . [MAR Hold] cholecalciferol  1,000 Units Oral Daily  . [MAR Hold] citalopram  20 mg Oral Daily  . [MAR Hold] diclofenac sodium  4 g Topical QID  . [MAR Hold] diltiazem  120 mg Oral Daily  . fluconazole  100 mg Oral  Daily  . [MAR Hold] furosemide  40 mg Oral Daily  . [MAR Hold] guaiFENesin  1,200 mg Oral BID  . [MAR Hold] insulin aspart  0-15 Units Subcutaneous TID WC  . [MAR Hold] insulin aspart  0-5 Units Subcutaneous QHS  . [MAR Hold] ipratropium  0.5 mg Nebulization BID  . [MAR Hold] iron polysaccharides  150 mg Oral BID  . [MAR Hold] levalbuterol  0.63 mg Nebulization BID  . [MAR Hold] pantoprazole  40 mg Oral BID  . [MAR Hold] potassium chloride SA  20 mEq Oral Daily  . [MAR Hold] pravastatin  20 mg Oral q1800  . [MAR Hold] senna-docusate  2 tablet Oral BID  . [MAR Hold] temazepam  15 mg Oral QHS   Continuous Infusions: . sodium chloride      LOS: 3 days   Kerney Elbe, DO [facility] Pager [phone]  If 7PM-7AM, please contact night-coverage [email or web address] Password [email or web address] 09/02/2017, 1:04 PM

## 2017-09-02 NOTE — Transfer of Care (Signed)
Immediate Anesthesia Transfer of Care Note  Patient: Joel [name]  Procedure(s) Performed: COLONOSCOPY (N/A ) BIOPSY  Patient Location: PACU and Endoscopy Unit  Anesthesia Type:MAC  Level of Consciousness: awake, alert  and oriented  Airway & Oxygen Therapy: Patient Spontanous Breathing  Post-op Assessment: Report given to RN and Post -op Vital signs reviewed and stable  Post vital signs: Reviewed and stable  Last Vitals:  Vitals Value Taken Time  BP 90/61 09/02/2017 12:27 PM  Temp    Pulse 82 09/02/2017 12:37 PM  Resp 21 09/02/2017 12:37 PM  SpO2 90 % 09/02/2017 12:37 PM  Vitals shown include unvalidated device data.  Last Pain:  Vitals:   09/02/17 1227  TempSrc: Oral  PainSc: 0-No pain      Patients Stated Pain Goal: 2 (82/99/37 1696)  Complications: No apparent anesthesia complications

## 2017-09-02 NOTE — Op Note (Signed)
[facility] Patient Name: Joel [name] Procedure Date: 09/02/2017 MRN: [id number] Attending MD: Ronald Lobo , MD Date of Birth: 11/27/1932 CSN: [id number] Age: 84 Admit Type: Inpatient Procedure:                Colonoscopy Indications:              Last colonoscopy: 2014, Gastrointestinal occult                            blood loss with anemia, egd showed only minor                            gastritis. Providers:                Ronald Lobo, MD, Carmie End, RN, Alan Mulder, Technician Referring MD:              Medicines:                Monitored Anesthesia Care Complications:            No immediate complications. Estimated Blood Loss:     Estimated blood loss: 3 mL. Procedure:                Pre-Anesthesia Assessment:                           - Prior to the procedure, a History and Physical                            was performed, and patient medications and                            allergies were reviewed. The patient's tolerance of                            previous anesthesia was also reviewed. The risks                            and benefits of the procedure and the sedation                            options and risks were discussed with the patient.                            All questions were answered, and informed consent                            was obtained. Prior Anticoagulants: The patient has                            taken Lovenox (enoxaparin), last dose was 3 days                            prior to procedure. ASA  Grade Assessment: III - A                            patient with severe systemic disease. After                            reviewing the risks and benefits, the patient was                            deemed in satisfactory condition to undergo the                            procedure.                           After obtaining informed consent, the colonoscope                            was  passed under direct vision. Throughout the                            procedure, the patient's blood pressure, pulse, and                            oxygen saturations were monitored continuously. The                            CF-HQ190L ([id number]) Olympus adult colonoscope was                            introduced through the anus and advanced to the the                            terminal ileum. The colonoscopy was performed                            without difficulty. The patient tolerated the                            procedure well. The quality of the bowel                            preparation was excellent. Scope In: 11:49:16 AM Scope Out: 12:22:07 PM Scope Withdrawal Time: 0 hours 23 minutes 30 seconds  Total Procedure Duration: 0 hours 32 minutes 51 seconds  Findings:      The digital rectal exam findings include flat prostate bed.      An ulcerated non-obstructing medium-sized MASS was found in the cecum.       The mass was non-circumferential. No bleeding was present. This was       biopsied with a cold forceps for histology. Estimated blood loss was       minimal.      A 6 mm polyp was found in the rectum (benign-appearing lesion). The       polyp was sessile. The polyp was removed with a cold snare.  Resection       and retrieval were complete. Estimated blood loss: 3 mL.      Multiple large-mouthed diverticula were found in the sigmoid colon.      The exam was otherwise normal throughout the examined colon.      The terminal ileum appeared normal.      The retroflexed view of the distal rectum and anal verge was normal and       showed no anal or rectal abnormalities. Impression:               - Flat prostate bed found on digital rectal exam.                           - LIKELY MALIGNANT MASS in the cecum. Biopsied.                            This could well account for patient's                            recurrent/ongoing low grade blood loss.                            - One benign appearing 6 mm polyp in the rectum,                            removed with a cold snare. Resected and retrieved.                           - Diverticulosis in the sigmoid colon.                           - The examined portion of the ileum was normal.                           - The distal rectum and anal verge are normal on                            retroflexion view. Moderate Sedation:      This patient was sedated with monitored anesthesia care, not moderate       sedation. Recommendation:           - Await pathology results.                           - Repeat colonoscopy in 1 year for surveillance.                           - Refer to a surgeon today. Procedure Code(s):        --- Professional ---                           [phone], Colonoscopy, flexible; with removal of                            tumor(s), polyp(s), or other lesion(s) by snare  technique                           45380, 59, Colonoscopy, flexible; with biopsy,                            single or multiple Diagnosis Code(s):        --- Professional ---                           D49.0, Neoplasm of unspecified behavior of                            digestive system                           K62.1, Rectal polyp                           R19.5, Other fecal abnormalities CPT copyright 2017 [employer]. All rights reserved. The codes documented in this report are preliminary and upon coder review may  be revised to meet current compliance requirements. Ronald Lobo, MD 09/02/2017 12:40:33 PM This report has been signed electronically. Number of Addenda: 0

## 2017-09-02 NOTE — Progress Notes (Signed)
Patient's colonoscopy shows a cecal mass, strongly suggestive of cancer.  It is not obstructing the lumen.  It likely accounts for his chronic blood loss, however.  I have notified general surgery, who will be seeing the patient.  This patient might benefit from an abdominal pelvic CT, both for staging purposes and to evaluate an apparent ventral hernia, as a prelude to surgery which would likely occur early next week, possibly following cardiac and/or pulmonary clearance.  The patient is aware of this finding.  We will sign off.  Please let us know if we can be of further assistance in this patient's care.  Cleotis Nipper, M.D. Pager [phone] If no answer or after 5 PM call [phone]

## 2017-09-02 NOTE — Consult Note (Addendum)
Joel [name] [birth year]  [id number].    Requesting MD: Dr. Ronald Lobo  Chief Complaint/Reason for Consult: cecal mass  HPI:  This is an 84 yo white male with multiple comorbidities such as CAD, s/p CABG, moderate aortic stenosis, a fib, COPD on prn O2 and BiPap at night, CHF, HTN, and DM who has a history of GI bleeding for years.  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  He has had multiple EGDs and colonoscopies in the past to evaluate his chronic bleeding.  His most recent c-scope was in 2014 which was essentially normal.  He has since had EGDs with the above findings.  He was ultimately taken off anticoagulants secondary to persistent oozing, but has continued to have melena at times.  He was going to have a double balloon enteroscopy earlier this year, but the service was cancelled right before his procedure.  He then fell and broke his hip in February and has been recovering ever since then.  He went to his pcp office on Tuesday and was complaining of SOB and fatigue.  His hgb was found to be 7 and he was referred to [facility] for admission.  GI was consulted and a colonoscopy was then done to rule out a new source of bleeding.  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  He currently has no abdominal pain, nausea, vomiting, or disruption of his normal BMs.  He has a CEA pending as well as a CT of his abd/pel.  He has some SOB which is somewhat his baseline secondary to his CHF.  He denies any CP.  He does admit to significant edema of his lower extremities and some pain currently in his LEs.  He also has pain in his right hip that is now chronic after his fracture earlier this year.  We have been asked to see the patient for evaluation for surgical intervention.  ROS: ROS: Please see HPI, otherwise all other systems have been reviewed and are  negative.  Family History  Problem Relation Age of Onset  . Tuberculosis Maternal Grandmother   . Heart attack Neg Hx   . Diabetes Neg Hx   . CAD Neg Hx   . Cancer Neg Hx     Past Medical History:  Diagnosis Date  . Allergic rhinitis   . Anemia   . Anxiety   . Aortic stenosis 2012   mild to mod   . Atrial fibrillation ([place]) 1991   with multiple DCCV  . Bipolar affective disorder ([place])   . CHF (congestive heart failure) ([place]) 08/30/2017  . COPD (chronic obstructive pulmonary disease) ([place])   . Diabetic neuropathy ([place])    in feet  . Diabetic neuropathy ([place])   . Dyslipidemia   . Fatigue    last year or so  . Gout   . Hypertension   . Insomnia   . Obesity   . On home oxygen therapy 2 1/2 liters with bipap at night  . OSA (obstructive sleep apnea)    severe, uses bipap 16 1/2 by 12 or 13 setting  . Prostate cancer ([place])   . Rectal bleeding 12/16/2015  . Type 2 diabetes mellitus ([place])   . Vertigo     Past Surgical History:  Procedure Laterality Date  . CARDIOVERSION  yrs ago   prior  to 1998  . COLONOSCOPY WITH PROPOFOL N/A 01/23/2013   Procedure: COLONOSCOPY WITH PROPOFOL;  Surgeon: Garlan Fair, MD;  Location: [facility] ENDOSCOPY;  Service: Endoscopy;  Laterality: N/A;  . electro shock  1969   for depression  . ENTEROSCOPY Left 05/17/2016   Procedure: ENTEROSCOPY;  Surgeon: Wilford Corner, MD;  Location: [facility] ENDOS[facility];  Service: Endoscopy;  Laterality: Left;  . ESOPHAGOGASTRODUODENOSCOPY N/A 03/03/2016   Procedure: ESOPHAGOGASTRODUODENOSCOPY (EGD);  Surgeon: Teena Irani, MD;  Location: [facility];  Service: Endoscopy;  Laterality: N/A;  . ESOPHAGOGASTRODUODENOSCOPY N/A 03/26/2016   Procedure: ESOPHAGOGASTRODUODENOSCOPY (EGD);  Surgeon: Teena Irani, MD;  Location: [facility] ENDOSCOPY;  Service: Endoscopy;  Laterality: N/A;  would like to use ultraslim scope  . ESOPHAGOGASTRODUODENOSCOPY N/A 08/31/2017   Procedure: ESOPHAGOGASTRODUODENOSCOPY (EGD);  Surgeon: Laurence Spates, MD;   Location: [facility];  Service: Endoscopy;  Laterality: N/A;  . ESOPHAGOGASTRODUODENOSCOPY (EGD) WITH PROPOFOL N/A 01/23/2013   Procedure: ESOPHAGOGASTRODUODENOSCOPY (EGD) WITH PROPOFOL;  Surgeon: Garlan Fair, MD;  [facility];  Service: Endoscopy;  Laterality: N/A;  . ESOPHAGOGASTRODUODENOSCOPY (EGD) WITH PROPOFOL N/A 04/13/2016   Procedure: ESOPHAGOGASTRODUODENOSCOPY (EGD) WITH PROPOFOL;  Surgeon: Otis Brace, MD;  Location: [facility];  Service: Gastroenterology;  Laterality: N/A;  . GIVENS CAPSULE STUDY N/A 03/03/2016   Procedure: GIVENS CAPSULE STUDY;  Surgeon: Teena Irani, MD;  [facility];  Service: Endoscopy;  Laterality: N/A;  . HEMORRHOID SURGERY N/A 12/16/2015   Procedure: PROCTOSCOPY WITH CONTROL OF BLEEDING;  Surgeon: Fanny Skates, MD;  Location: [facility];  Service: General;  Laterality: N/A;  . HIP PINNING,CANNULATED Right 03/23/2017   Procedure: CANNULATED HIP PINNING;  Surgeon: Marybelle Killings, MD;  Location: [facility];  Service: Orthopedics;  Laterality: Right;  . KNEE SURGERY  1970's   rt  . MAZE  1998  . PROSTATE BIOPSY    . SHOULDER SURGERY  7-8 yrs ago   rt    Social History:  reports that he quit smoking about 41 years ago. His smoking use included cigarettes. He has a 50.00 pack-year smoking history. He has never used smokeless tobacco. He reports that he does not drink alcohol or use drugs.  Allergies:  Allergies  Allergen Reactions  . Penicillins Anaphylaxis    Has patient had a PCN reaction causing immediate rash, facial/tongue/throat swelling, SOB or lightheadedness with hypotension: unknown Has patient had a PCN reaction causing severe rash involving mucus membranes or skin necrosis: unknown Has patient had a PCN reaction that required hospitalization: unknown Has patient had a PCN reaction occurring within the last 10 years: no If all of the above answers are "NO", then may proceed with Cephalosporin use.   Marland Kitchen Antihistamines,  Loratadine-Type Other (See Comments)    depression  . Other     Beta blockers-Novacaine  Caused depression    Facility-Administered Medications Prior to Admission  Medication Dose Route Frequency Provider Last Rate Last Dose  . diclofenac sodium (VOLTAREN) 1 % transdermal gel 4 g  4 g Topical QID Marybelle Killings, MD       Medications Prior to Admission  Medication Sig Dispense Refill  . allopurinol (ZYLOPRIM) 100 MG tablet Take 100 mg by mouth daily.     . Ascorbic Acid (VITAMIN C PO) Take 1 tablet by mouth daily.    Marland Kitchen buPROPion (WELLBUTRIN XL) 150 MG 24 hr tablet Take 1 tablet (150 mg total) by mouth daily. 30 tablet 1  . cholecalciferol (VITAMIN D) 1000 units tablet Take 1,000 Units by mouth daily.    Marland Kitchen  citalopram (CELEXA) 40 MG tablet Take 40 mg by mouth daily.     . Cyanocobalamin (VITAMIN B-12 PO) Take 1 tablet by mouth daily.    Marland Kitchen dextromethorphan-guaiFENesin (MUCINEX DM) 30-600 MG 12hr tablet Take 1 tablet by mouth 2 (two) times daily.    Marland Kitchen diltiazem (CARDIZEM SR) 120 MG 12 hr capsule Take 120 mg by mouth daily.   0  . enoxaparin (LOVENOX) 30 MG/0.3ML injection Inject 0.3 mLs (30 mg total) into the skin daily. 28 Syringe 0  . fluticasone (FLONASE) 50 MCG/ACT nasal spray Place 2 sprays into both nostrils daily. (Patient taking differently: Place 2 sprays into both nostrils daily as needed for allergies or rhinitis. ) 16 g 1  . furosemide (LASIX) 40 MG tablet Take 1 tablet (40 mg total) by mouth daily. (Patient taking differently: Take 80 mg by mouth daily. ) 30 tablet 1  . glimepiride (AMARYL) 1 MG tablet Take 1 mg by mouth daily with breakfast.   0  . iron polysaccharides (NIFEREX) 150 MG capsule Take 1 capsule (150 mg total) by mouth 2 (two) times daily. 60 capsule 0  . losartan (COZAAR) 25 MG tablet Take 1 tablet (25 mg total) by mouth daily. 30 tablet 1  . lovastatin (MEVACOR) 20 MG tablet Take 20 mg by mouth at bedtime.     . meclizine (ANTIVERT) 25 MG tablet Take 1 tablet (25 mg  total) by mouth 3 (three) times daily as needed for dizziness. (Patient taking differently: Take 25 mg by mouth 2 (two) times daily. ) 30 tablet 0  . metFORMIN (GLUCOPHAGE) 1000 MG tablet Take 1,000 mg by mouth 2 (two) times daily.   0  . Multiple Vitamins-Minerals (VISION FORMULA PO) Take 1 tablet by mouth 2 (two) times daily.    Marland Kitchen omeprazole (PRILOSEC) 20 MG capsule Take 1 capsule (20 mg total) by mouth 2 (two) times daily. 60 capsule 1  . OXYGEN Inhale 2 L into the lungs as needed (shortness of breath).     . potassium chloride SA (K-DUR,KLOR-CON) 20 MEQ tablet Take 20 mEq by mouth daily.   0  . senna-docusate (SENOKOT-S) 8.6-50 MG tablet Take 2 tablets by mouth 2 (two) times daily. 120 tablet 1  . temazepam (RESTORIL) 15 MG capsule Take 1 capsule (15 mg total) by mouth at bedtime. 30 capsule 0     Physical Exam: Blood pressure 130/72, pulse 78, temperature 98 F (36.7 C), temperature source Oral, resp. rate (!) 22, height 6' (1.829 m), weight 108.9 kg (240 lb), SpO2 94 %. General: pleasant, obese white male who is sitting up in his chair in NAD HEENT: head is normocephalic, atraumatic.  Sclera are noninjected.  PERRL.  Ears and nose without any masses or lesions.  Mouth is pink and moist Heart: irregularly irregular.  Normal s1,s2. +murmur. No obvious  gallops, or rubs noted.  Palpable radial and pedal pulses bilaterally Lungs: CTAB, no wheezes, rhonchi, or rales noted.  Respiratory effort nonlabored Abd: soft, NT, ND/obese, +BS, no masses or organomegaly.  He does have a small reducible umbilical hernia.  He has some rectus diastasis but ventral hernia is noted. MS: all 4 extremities are symmetrical with no cyanosis or clubbing.  He does however, have significant edema of his BLE with some redness noted.  Unclear if this is cellulitis vs some chronic venous stasis early changes etc. Skin: warm and dry with no masses, lesions, or rashes Psych: A&Ox3 with an appropriate affect.   Results  for orders placed  or performed during the hospital encounter of 08/30/17 (from the past 48 hour(s))  Glucose, capillary     Status: Abnormal   Collection Time: 08/31/17  4:14 PM  Result Value Ref Range   Glucose-Capillary 135 (H) 70 - 99 mg/dL  Hemoglobin and hematocrit, blood     Status: Abnormal   Collection Time: 08/31/17  5:09 PM  Result Value Ref Range   Hemoglobin 9.0 (L) 13.0 - 17.0 g/dL   HCT 29.2 (L) 39.0 - 52.0 %    Comment: Performed at [facility], [place] [address], [place], [place] [address]  Glucose, capillary     Status: Abnormal   Collection Time: 08/31/17  8:47 PM  Result Value Ref Range   Glucose-Capillary 162 (H) 70 - 99 mg/dL  Glucose, capillary     Status: Abnormal   Collection Time: 08/31/17 11:48 PM  Result Value Ref Range   Glucose-Capillary 174 (H) 70 - 99 mg/dL  Brain natriuretic peptide     Status: Abnormal   Collection Time: 09/01/17 12:37 AM  Result Value Ref Range   B Natriuretic Peptide 237.1 (H) 0.0 - 100.0 pg/mL    Comment: Performed at [facility], [place] [address], [place], [place] [address]  CBC with Differential/Platelet     Status: Abnormal   Collection Time: 09/01/17 12:37 AM  Result Value Ref Range   WBC 6.6 4.0 - 10.5 K/uL   RBC 2.89 (L) 4.22 - 5.81 MIL/uL   Hemoglobin 8.4 (L) 13.0 - 17.0 g/dL   HCT 27.2 (L) 39.0 - 52.0 %   MCV 94.1 78.0 - 100.0 fL   MCH 29.1 26.0 - 34.0 pg   MCHC 30.9 30.0 - 36.0 g/dL   RDW 17.6 (H) 11.5 - 15.5 %   Platelets 177 150 - 400 K/uL   Neutrophils Relative % 79 %   Neutro Abs 5.2 1.7 - 7.7 K/uL   Lymphocytes Relative 9 %   Lymphs Abs 0.6 (L) 0.7 - 4.0 K/uL   Monocytes Relative 8 %   Monocytes Absolute 0.6 0.1 - 1.0 K/uL   Eosinophils Relative 4 %   Eosinophils Absolute 0.2 0.0 - 0.7 K/uL   Basophils Relative 0 %   Basophils Absolute 0.0 0.0 - 0.1 K/uL    Comment: Performed at [facility], [place] [address], [place], [place] [address]  Comprehensive  metabolic panel     Status: Abnormal   Collection Time: 09/01/17 12:37 AM  Result Value Ref Range   Sodium 142 135 - 145 mmol/L   Potassium 4.3 3.5 - 5.1 mmol/L   Chloride 107 98 - 111 mmol/L   CO2 26 22 - 32 mmol/L   Glucose, Bld 174 (H) 70 - 99 mg/dL   BUN 24 (H) 8 - 23 mg/dL   Creatinine, Ser 1.31 (H) 0.61 - 1.24 mg/dL   Calcium 8.8 (L) 8.9 - 10.3 mg/dL   Total Protein 6.8 6.5 - 8.1 g/dL   Albumin 3.6 3.5 - 5.0 g/dL   AST 15 15 - 41 U/L   ALT 12 0 - 44 U/L   Alkaline Phosphatase 60 38 - 126 U/L   Total Bilirubin 0.8 0.3 - 1.2 mg/dL   GFR calc non Af Amer 49 (L) >60 mL/min   GFR calc Af Amer 57 (L) >60 mL/min    Comment: (NOTE) The eGFR has been calculated using the CKD EPI equation. This calculation has not been validated in all clinical situations. eGFR's persistently <60 mL/min  signify possible Chronic Kidney Disease.    Anion gap 9 5 - 15    Comment: Performed at [facility], [place] [address] [place], [place] [address]  Magnesium     Status: None   Collection Time: 09/01/17 12:37 AM  Result Value Ref Range   Magnesium 2.4 1.7 - 2.4 mg/dL    Comment: Performed at [facility], [place] [address], [place], [place] [address]  Phosphorus     Status: None   Collection Time: 09/01/17 12:37 AM  Result Value Ref Range   Phosphorus 4.2 2.5 - 4.6 mg/dL    Comment: Performed at [facility], [place] [address] [place], [place] [address]  Glucose, capillary     Status: Abnormal   Collection Time: 09/01/17  7:53 AM  Result Value Ref Range   Glucose-Capillary 168 (H) 70 - 99 mg/dL   Comment 1 Notify RN   Hemoglobin and hematocrit, blood     Status: Abnormal   Collection Time: 09/01/17  8:25 AM  Result Value Ref Range   Hemoglobin 9.0 (L) 13.0 - 17.0 g/dL   HCT 28.7 (L) 39.0 - 52.0 %    Comment: Performed at [facility], [place] [address], [place], [place] [address]  Glucose, capillary     Status: Abnormal    Collection Time: 09/01/17 12:14 PM  Result Value Ref Range   Glucose-Capillary 190 (H) 70 - 99 mg/dL   Comment 1 Notify RN   Glucose, capillary     Status: Abnormal   Collection Time: 09/01/17  4:44 PM  Result Value Ref Range   Glucose-Capillary 177 (H) 70 - 99 mg/dL   Comment 1 Notify RN   Hemoglobin and hematocrit, blood     Status: Abnormal   Collection Time: 09/01/17  5:44 PM  Result Value Ref Range   Hemoglobin 8.9 (L) 13.0 - 17.0 g/dL   HCT 28.6 (L) 39.0 - 52.0 %    Comment: Performed at [facility], [place] [address] [place], [place] [address]  Glucose, capillary     Status: Abnormal   Collection Time: 09/01/17  8:58 PM  Result Value Ref Range   Glucose-Capillary 133 (H) 70 - 99 mg/dL  CBC with Differential/Platelet     Status: Abnormal   Collection Time: 09/02/17 12:54 AM  Result Value Ref Range   WBC 4.5 4.0 - 10.5 K/uL   RBC 2.99 (L) 4.22 - 5.81 MIL/uL   Hemoglobin 8.9 (L) 13.0 - 17.0 g/dL   HCT 28.4 (L) 39.0 - 52.0 %   MCV 95.0 78.0 - 100.0 fL   MCH 29.8 26.0 - 34.0 pg   MCHC 31.3 30.0 - 36.0 g/dL   RDW 17.1 (H) 11.5 - 15.5 %   Platelets 188 150 - 400 K/uL   Neutrophils Relative % 71 %   Neutro Abs 3.2 1.7 - 7.7 K/uL   Lymphocytes Relative 14 %   Lymphs Abs 0.6 (L) 0.7 - 4.0 K/uL   Monocytes Relative 9 %   Monocytes Absolute 0.4 0.1 - 1.0 K/uL   Eosinophils Relative 5 %   Eosinophils Absolute 0.2 0.0 - 0.7 K/uL   Basophils Relative 1 %   Basophils Absolute 0.0 0.0 - 0.1 K/uL    Comment: Performed at [facility], [place] [address] [place], [place] [address]  Comprehensive metabolic panel     Status: Abnormal   Collection Time: 09/02/17 12:54 AM  Result Value Ref Range  Sodium 145 135 - 145 mmol/L   Potassium 4.2 3.5 - 5.1 mmol/L   Chloride 107 98 - 111 mmol/L   CO2 30 22 - 32 mmol/L   Glucose, Bld 140 (H) 70 - 99 mg/dL   BUN 21 8 - 23 mg/dL   Creatinine, Ser 1.18 0.61 - 1.24 mg/dL   Calcium 9.2 8.9 - 10.3 mg/dL    Total Protein 7.1 6.5 - 8.1 g/dL   Albumin 3.6 3.5 - 5.0 g/dL   AST 13 (L) 15 - 41 U/L   ALT 12 0 - 44 U/L   Alkaline Phosphatase 59 38 - 126 U/L   Total Bilirubin 0.9 0.3 - 1.2 mg/dL   GFR calc non Af Amer 56 (L) >60 mL/min   GFR calc Af Amer >60 >60 mL/min    Comment: (NOTE) The eGFR has been calculated using the CKD EPI equation. This calculation has not been validated in all clinical situations. eGFR's persistently <60 mL/min signify possible Chronic Kidney Disease.    Anion gap 8 5 - 15    Comment: Performed at [facility], [place] [address], [place], [place] [address]  Magnesium     Status: None   Collection Time: 09/02/17 12:54 AM  Result Value Ref Range   Magnesium 2.3 1.7 - 2.4 mg/dL    Comment: Performed at [facility], [place] [address] [place], [place] [address]  Phosphorus     Status: None   Collection Time: 09/02/17 12:54 AM  Result Value Ref Range   Phosphorus 3.9 2.5 - 4.6 mg/dL    Comment: Performed at [facility], [place] [address] [place], [place] [address]  Glucose, capillary     Status: Abnormal   Collection Time: 09/02/17  7:45 AM  Result Value Ref Range   Glucose-Capillary 136 (H) 70 - 99 mg/dL  Hemoglobin and hematocrit, blood     Status: Abnormal   Collection Time: 09/02/17  8:04 AM  Result Value Ref Range   Hemoglobin 8.7 (L) 13.0 - 17.0 g/dL   HCT 27.7 (L) 39.0 - 52.0 %    Comment: Performed at [facility], [place] [address] [place], [place] [address]   Dg Chest Port 1 View  Result Date: 09/01/2017 CLINICAL DATA:  84 y.o. male with medical history significant for CAD, Afib not on anticoagulation due to history of recurrent GI bleed felt to be from small gastric AVMs causing iron deficiency anemia. EXAM: PORTABLE CHEST 1 VIEW COMPARISON:  03/22/2017 FINDINGS: Median sternotomy. The heart is enlarged. There is perihilar airspace filling and prominent interstitial markings consistent  with pulmonary edema. No focal consolidations. IMPRESSION: Cardiomegaly and mild pulmonary edema. Electronically Signed   By: Nolon Nations M.D.   On: 09/01/2017 16:18      Assessment/Plan LGI bleed, cecal mass The patient has been having chronic GI bleeding for years.  He has previously had AVMs etc as his source; however, he has bow been found to have a cecal mass.  This is, by appearance, likely malignant.  Biopsy is pending.  This will need to be resected and would likely plan to proceed early next week.  He will need to remain on clear liquid diet only throughout the weekend to avoid repeat colon prep.  We discussed the procedure for possible lap assisted vs open partial colectomy along with possible risks and complications.  This was discussed with the patient as well as his daughter and close friend.  All questions were  answered.  He will do ERAS pre-op prep in order to maximize surgery and post op recovery.  CAD/AS/CHF/A Fib/HTN The patient has a history of CAD with aortic stenosis and pulmonary HTN along with CHF.  He will need cardiology to evaluate him for pre-surgical risk stratification and to make sure he is maximized for surgical intervention.  We have discussed that he will have a higher risk for cardiac complications secondary to his current cardiac history.  He and his family understand.  COPD/OSA Patient wears prn 2LO2 and wears BiPap at night.  He currently sounds clear.  Do not feel strongly that he needs pulmonary clearance as he is already aware that he has a risk for pulmonary complications such as need for ventilator support etc after surgery.  If this were to be the case, pulmonary would be consulted and can assist with management.  Otherwise, continue current nebs etc.  CKD DM Gout HLD  FEN - clear liquids, do NOT advance VTE - SCDs ID - none currently, will get some pre-op  Henreitta Cea, [facility] 09/02/2017, 2:51 PM Pager:  [phone]

## 2017-09-02 NOTE — Evaluation (Signed)
Physical Therapy Evaluation Patient Details Name: Joel [name] MRN: [id number] DOB: 06-22-1932 Today's Date: 09/02/2017   History of Present Illness  a 84 y.o. male with medical history significant for CAD, Afib not on anticoagulation due to history of recurrent GI bleed due to suspected small gastric AVMs causing iron deficiency anemia. Surgical history of R hip cannulated pinning in 03/2017, R shoulder surgery in 2011/2012, and electrotherapy for depression in 1969.   Clinical Impression   Pt is a 84 YO male admitted for anemia secondary to GI bleed. PMH and surgical history noted above. Pt presents with generalized LE weakness, increased time to perform transfers, and decreased activity tolerance. Pt has a history of falls most recently prior to his R hip surgery in 03/2017. Pt went to SNF for 6 weeks after hip surgery, then returned home and has been independent with assistive devices in home environment. Pt would benefit from acute PT to address his deficits. Upon arrival to room, pt was off supplemental O2, so left off suppplemental O2 for duration of session. Pt maintained sats during session between 94-97%. Pt uses O2 at home, but only as needed. Pt plans to d/c home.    Follow Up Recommendations Other (comment)(Pt states he had 6 weeks at SNF post R hip surgery, and now goes to [place] at the [facility]. Recommending pt continues with current plan.  )    Equipment Recommendations  None recommended by PT    Recommendations for Other Services       Precautions / Restrictions Precautions Precautions: Fall Restrictions Weight Bearing Restrictions: No      Mobility  Bed Mobility               General bed mobility comments: pt up in chair upon arrival to room   Transfers Overall transfer level: Needs assistance Equipment used: None Transfers: Stand Pivot Transfers;Sit to/from Stand Sit to Stand: Supervision;Modified independent (Device/Increase time) Stand pivot transfers:  Supervision       General transfer comment: verbal cuing for sequencing, supervision for first time up with pt. Required no physical assist to take 3 steps to W/C. Reaches for environment when transfering. Increased time to complete tasks.   Ambulation/Gait                Hotel manager mobility: Yes Wheelchair propulsion: Both lower extermities Wheelchair parts: Supervision/cueing Distance: 200 Web designer Details (indicate cue type and reason): required rest breaks during session due to pt reported LE soreness and weakness, verbal cuing x1 for moving wheelchair away from wall.   Modified Rankin (Stroke Patients Only)       Balance Overall balance assessment: Needs assistance;History of Falls Sitting-balance support: No upper extremity supported Sitting balance-Leahy Scale: Fair     Standing balance support: Bilateral upper extremity supported Standing balance-Leahy Scale: Poor Standing balance comment: reaches for environment for balance as soon as standing                              Pertinent Vitals/Pain Pain Assessment: No/denies pain    Home Living Family/patient expects to be discharged to:: Private residence Living Arrangements: Alone Available Help at Discharge: Family;Available PRN/intermittently(pt reports "family is busy" ) Type of Home: House Home Access: Ramped entrance     Home Layout: Able to live on main level with bedroom/bathroom Home Equipment: Wheelchair -  manual;Walker - 2 wheels;Toilet riser;Cane - quad Additional Comments: uses wheelchair for all home and community ambulation, uses environment (countertops, Network engineer) to navigate bathroom/bedroom     Prior Function Level of Independence: Independent with assistive device(s)               Hand Dominance        Extremity/Trunk Assessment   Upper Extremity Assessment Upper Extremity  Assessment: Overall WFL for tasks assessed    Lower Extremity Assessment Lower Extremity Assessment: Generalized weakness(able to propel wheelchair with bilateral knee flexion and extension, requiring rest breaks and noting "my legs feel sore" )       Communication   Communication: No difficulties  Cognition Arousal/Alertness: Awake/alert Behavior During Therapy: WFL for tasks assessed/performed Overall Cognitive Status: Within Functional Limits for tasks assessed                                        General Comments      Exercises     Assessment/Plan    PT Assessment Patient needs continued PT services  PT Problem List Decreased strength;Decreased activity tolerance;Decreased balance;Cardiopulmonary status limiting activity;Obesity       PT Treatment Interventions DME instruction;Balance training;Functional mobility training;Patient/family education;Therapeutic activities;Therapeutic exercise;Wheelchair mobility training    PT Goals (Current goals can be found in the Care Plan section)  Acute Rehab PT Goals PT Goal Formulation: With patient Time For Goal Achievement: 09/16/17 Potential to Achieve Goals: Good Additional Goals Additional Goal #1: Pt will propel manual wheelchair with bilat feet for 250 feet with modified independence.    Frequency Min 3X/week   Barriers to discharge        Co-evaluation               AM-PAC PT "6 Clicks" Daily Activity  Outcome Measure Difficulty turning over in bed (including adjusting bedclothes, sheets and blankets)?: A Lot Difficulty moving from lying on back to sitting on the side of the bed? : A Lot Difficulty sitting down on and standing up from a chair with arms (e.g., wheelchair, bedside commode, etc,.)?: A Little Help needed moving to and from a bed to chair (including a wheelchair)?: A Little Help needed walking in hospital room?: Total Help needed climbing 3-5 steps with a railing? : Total 6  Click Score: 12    End of Session Equipment Utilized During Treatment: Gait belt Activity Tolerance: Patient tolerated treatment well;Patient limited by fatigue Patient left: in chair;with nursing/sitter in room;with call bell/phone within reach   PT Visit Diagnosis: Unsteadiness on feet (R26.81);Other abnormalities of gait and mobility (R26.89)    Time: 1020-1044 PT Time Calculation (min) (ACUTE ONLY): 24 min   Charges:   PT Evaluation $PT Eval Low Complexity: 1 Low          Jo-Anne Kluth Conception Chancy, PT, DPT  Pager # [phone]    Miyonna Ormiston D Holt Woolbright 09/02/2017, 1:10 PM

## 2017-09-02 NOTE — Progress Notes (Signed)
OT Cancellation Note  Patient Details Name: Joel [name] MRN: [id number] DOB: [birth year]   Cancelled Treatment:    Reason Eval/Treat Not Completed: Patient at procedure or test/ unavailable  [name] 09/02/2017, 12:41 PM  Lesle Chris, OTR/L [phone] 09/02/2017

## 2017-09-02 NOTE — Anesthesia Postprocedure Evaluation (Signed)
Anesthesia Post Note  Patient: Joel [name]  Procedure(s) Performed: COLONOSCOPY (N/A ) BIOPSY     Patient location during evaluation: PACU Anesthesia Type: MAC Level of consciousness: awake and alert and oriented Pain management: pain level controlled Vital Signs Assessment: post-procedure vital signs reviewed and stable Respiratory status: spontaneous breathing, nonlabored ventilation and respiratory function stable Cardiovascular status: stable and blood pressure returned to baseline Postop Assessment: no apparent nausea or vomiting Anesthetic complications: no    Last Vitals:  Vitals:   09/02/17 1106 09/02/17 1227  BP: (!) 131/58 90/61  Pulse: 85 78  Resp: (!) 25 18  Temp: 36.7 C   SpO2: 94% 92%    Last Pain:  Vitals:   09/02/17 1227  TempSrc: Oral  PainSc: 0-No pain                 Amelya Mabry A.

## 2017-09-02 NOTE — Interval H&P Note (Signed)
History and Physical Interval Note:  09/02/2017 11:38 AM  [place]  has presented today for surgery, with the diagnosis of positive stool. After consideration of risks, benefits and other options for treatment, the patient has consented to  Procedure(s): COLONOSCOPY (N/A) as a surgical intervention .  The patient's history has been reviewed, patient examined, no change in status, stable for surgery.  I have reviewed the patient's chart and labs.  Questions were answered to the patient's satisfaction.     Cleotis Nipper

## 2017-09-02 NOTE — Anesthesia Preprocedure Evaluation (Addendum)
Anesthesia Evaluation  Patient identified by MRN, date of birth, ID band Patient awake    Reviewed: Allergy & Precautions, NPO status , Patient's Chart, lab work & pertinent test results  Airway Mallampati: II       Dental  (+) Edentulous Upper, Edentulous Lower   Pulmonary shortness of breath, with exertion and Long-Term Oxygen Therapy, sleep apnea and Continuous Positive Airway Pressure Ventilation , COPD,  COPD inhaler, former smoker,    Pulmonary exam normal breath sounds clear to auscultation + decreased breath sounds      Cardiovascular hypertension, Pt. on medications +CHF  + dysrhythmias Atrial Fibrillation + Valvular Problems/Murmurs AS  Rhythm:Irregular Rate:Normal + Systolic murmurs Echo 04/06/5730 -Left ventricle: Wall thickness was increased in a pattern of moderate LVH. Systolic function was normal. The estimated ejection fraction was in the range of 55% to 60%. The study is not technically sufficient to allow evaluation of LV diastolic function. - Aortic valve: There was moderate stenosis. Valve area (VTI): 1.57 cm^2. Valve area (Vmax): 1.74 cm^2. Valve area (Vmean): 1.53  cm^2. - Mitral valve: Moderately calcified annulus. Mildly thickened, mildly calcified leaflets . There was mild regurgitation. Valve area by continuity equation (using LVOT flow): 2.59 cm^2. - Left atrium: The atrium was moderately dilated. - Atrial septum: No defect or patent foramen ovale was identified. - Pulmonary arteries: PA peak pressure: 65 mm Hg (S). - Impressions: Gradients have increased since 2017 when they were mean 16 mmHg and peak 26 mmHg.  Impressions:- Gradients have increased since 2017 when they were mean 16 mmHg and peak 26 mmHg.  EKG- Atrial fibrillation HR 202/RKY  3/6 systolic murmur LUSB   Neuro/Psych PSYCHIATRIC DISORDERS Anxiety Bipolar Disorder Diabetic neuropathy Vertigo    GI/Hepatic Neg liver ROS, GERD  Medicated  and Controlled,Heme positive stools   Endo/Other  diabetes, Well Controlled, Type 2, Oral Hypoglycemic AgentsGout  Renal/GU negative Renal ROS   Hx/o Prostate Ca    Musculoskeletal negative musculoskeletal ROS (+)   Abdominal (+) + obese,   Peds  Hematology  (+) anemia ,   Anesthesia Other Findings   Reproductive/Obstetrics                         Anesthesia Physical  Anesthesia Plan  ASA: III  Anesthesia Plan: MAC   Post-op Pain Management:    Induction:   PONV Risk Score and Plan: Treatment may vary due to age or medical condition and Ondansetron  Airway Management Planned: Natural Airway and Mask  Additional Equipment:   Intra-op Plan:   Post-operative Plan:   Informed Consent: I have reviewed the patients History and Physical, chart, labs and discussed the procedure including the risks, benefits and alternatives for the proposed anesthesia with the patient or authorized representative who has indicated his/her understanding and acceptance.   Dental advisory given  Plan Discussed with: CRNA  Anesthesia Plan Comments:         Anesthesia Quick Evaluation

## 2017-09-02 NOTE — Care Management Important Message (Signed)
Important Message  Patient Details  Name: Joel [name] MRN: [id number] Date of Birth: September 29, 1932   Medicare Important Message Given:  Yes    Kerin Salen 09/02/2017, 10:42 AMImportant Message  Patient Details  Name: Joel [name] MRN: [id number] Date of Birth: [birth year]   Medicare Important Message Given:  Yes    Kerin Salen 09/02/2017, 10:42 AM

## 2017-09-03 ENCOUNTER — Inpatient Hospital Stay (HOSPITAL_COMMUNITY): Payer: PPO

## 2017-09-03 DIAGNOSIS — I350 Nonrheumatic aortic (valve) stenosis: Secondary | ICD-10-CM

## 2017-09-03 DIAGNOSIS — I481 Persistent atrial fibrillation: Secondary | ICD-10-CM

## 2017-09-03 DIAGNOSIS — I5033 Acute on chronic diastolic (congestive) heart failure: Secondary | ICD-10-CM

## 2017-09-03 DIAGNOSIS — Z01810 Encounter for preprocedural cardiovascular examination: Secondary | ICD-10-CM

## 2017-09-03 LAB — PROTEIN, PLEURAL OR PERITONEAL FLUID

## 2017-09-03 LAB — CBC WITH DIFFERENTIAL/PLATELET
BASOS ABS: 0 10*3/uL (ref 0.0–0.1)
Basophils Relative: 1 %
EOS PCT: 7 %
Eosinophils Absolute: 0.3 10*3/uL (ref 0.0–0.7)
HCT: 28 % — ABNORMAL LOW (ref 39.0–52.0)
Hemoglobin: 8.8 g/dL — ABNORMAL LOW (ref 13.0–17.0)
LYMPHS ABS: 0.5 10*3/uL — AB (ref 0.7–4.0)
LYMPHS PCT: 13 %
MCH: 29.7 pg (ref 26.0–34.0)
MCHC: 31.4 g/dL (ref 30.0–36.0)
MCV: 94.6 fL (ref 78.0–100.0)
Monocytes Absolute: 0.4 10*3/uL (ref 0.1–1.0)
Monocytes Relative: 9 %
NEUTROS PCT: 70 %
Neutro Abs: 2.9 10*3/uL (ref 1.7–7.7)
PLATELETS: 180 10*3/uL (ref 150–400)
RBC: 2.96 MIL/uL — AB (ref 4.22–5.81)
RDW: 16.4 % — ABNORMAL HIGH (ref 11.5–15.5)
WBC: 4.1 10*3/uL (ref 4.0–10.5)

## 2017-09-03 LAB — COMPREHENSIVE METABOLIC PANEL
ALK PHOS: 55 U/L (ref 38–126)
ALT: 10 U/L (ref 0–44)
AST: 16 U/L (ref 15–41)
Albumin: 3.7 g/dL (ref 3.5–5.0)
Anion gap: 9 (ref 5–15)
BUN: 19 mg/dL (ref 8–23)
CALCIUM: 9.1 mg/dL (ref 8.9–10.3)
CHLORIDE: 103 mmol/L (ref 98–111)
CO2: 26 mmol/L (ref 22–32)
CREATININE: 1.15 mg/dL (ref 0.61–1.24)
GFR, EST NON AFRICAN AMERICAN: 57 mL/min — AB (ref >60)
Glucose, Bld: 131 mg/dL — ABNORMAL HIGH (ref 70–99)
Potassium: 4.2 mmol/L (ref 3.5–5.1)
SODIUM: 138 mmol/L (ref 135–145)
Total Bilirubin: 0.6 mg/dL (ref 0.3–1.2)
Total Protein: 6.9 g/dL (ref 6.5–8.1)

## 2017-09-03 LAB — BODY FLUID CELL COUNT WITH DIFFERENTIAL
Lymphs, Fluid: 40 %
MONOCYTE-MACROPHAGE-SEROUS FLUID: 2 % — AB (ref 50–90)
NEUTROPHIL FLUID: 58 % — AB (ref 0–25)
WBC FLUID: 406 uL (ref 0–1000)

## 2017-09-03 LAB — GLUCOSE, CAPILLARY
GLUCOSE-CAPILLARY: 142 mg/dL — AB (ref 70–99)
Glucose-Capillary: 123 mg/dL — ABNORMAL HIGH (ref 70–99)
Glucose-Capillary: 165 mg/dL — ABNORMAL HIGH (ref 70–99)
Glucose-Capillary: 159 mg/dL — ABNORMAL HIGH (ref 70–99)

## 2017-09-03 LAB — ALBUMIN, PLEURAL OR PERITONEAL FLUID: Albumin, Fluid: 1.3 g/dL

## 2017-09-03 LAB — GLUCOSE, PLEURAL OR PERITONEAL FLUID: Glucose, Fluid: 173 mg/dL

## 2017-09-03 LAB — CEA: CEA: 4.4 ng/mL (ref 0.0–4.7)

## 2017-09-03 LAB — LACTATE DEHYDROGENASE, PLEURAL OR PERITONEAL FLUID: LD FL: 57 U/L — AB (ref 3–23)

## 2017-09-03 LAB — PHOSPHORUS: Phosphorus: 4 mg/dL (ref 2.5–4.6)

## 2017-09-03 LAB — MAGNESIUM: Magnesium: 2.2 mg/dL (ref 1.7–2.4)

## 2017-09-03 MED ORDER — FUROSEMIDE 40 MG PO TABS
80.0000 mg | ORAL_TABLET | Freq: Every day | ORAL | Status: DC
  Administered 2017-09-03 – 2017-09-13 (×10): 80 mg via ORAL
  Filled 2017-09-03 (×10): qty 2

## 2017-09-03 MED ORDER — LIDOCAINE HCL 1 % IJ SOLN
INTRAMUSCULAR | Status: AC
  Filled 2017-09-03: qty 20

## 2017-09-03 MED ORDER — FUROSEMIDE 10 MG/ML IJ SOLN
40.0000 mg | Freq: Once | INTRAMUSCULAR | Status: AC
  Administered 2017-09-03: 40 mg via INTRAVENOUS
  Filled 2017-09-03: qty 4

## 2017-09-03 NOTE — Procedures (Signed)
PROCEDURE SUMMARY:  Successful image-guided right thoracentesis. Yielded 650 milliliters of clear gold fluid. Patient tolerated procedure well. No immediate complications.  Specimen was sent for labs. CXR ordered.  Alexandra Louk PA-C 09/03/2017 3:02 PM

## 2017-09-03 NOTE — Consult Note (Signed)
CARDIOLOGY CONSULT NOTE       Patient ID: Joel SCHILLER MRN: [id number] DOB/AGE: Jun 17, 1932 84 y.o.  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 Long history of GI bleeding and anemia with AVM's as well. Chronic afib with anticoagulation held multiple times in past due to bleeding. Also COPD on home oxygen. Hes had previous Cox/Maze and ? Distant CABG no recent angina / chest pain Echo shows normal EF 55-60% moderate AS with mean gradient 20 mmHg and peak 37 mmHg. He has had recent fall and can barely ambulate. Currently no chest pain mild dyspnea and no palpitations. Afib rate control is fine Hct is low at 28   ROS All other systems reviewed and negative except as noted above  Past Medical History:  Diagnosis Date  . Allergic rhinitis   . Anemia   . Anxiety   . Aortic stenosis 2012   mild to mod   . Atrial fibrillation ([place]) 1991   with multiple DCCV  . Bipolar affective disorder ([place])   . CHF (congestive heart failure) ([place]) 08/30/2017  . COPD (chronic obstructive pulmonary disease) ([place])   . Diabetic neuropathy ([place])    in feet  . Diabetic neuropathy ([place])   . Dyslipidemia   . Fatigue    last year or so  . Gout   . Hypertension   . Insomnia   . Obesity   . On home oxygen therapy 2 1/2 liters with bipap at night  . OSA (obstructive sleep apnea)    severe, uses bipap 16 1/2 by 12 or 13 setting  . Prostate cancer ([place])   . Rectal bleeding 12/16/2015  . Type 2 diabetes mellitus ([place])   . Vertigo     Family History  Problem Relation Age of Onset  . Tuberculosis  Maternal Grandmother   . Heart attack Neg Hx   . Diabetes Neg Hx   . CAD Neg Hx   . Cancer Neg Hx     Social History   Socioeconomic History  . Marital status: Widowed    Spouse name: Not on file  . Number of children: Not on file  . Years of education: Not on file  . Highest education level: Not on file  Occupational History  . Occupation: Event organiser  . Financial resource strain: Not on file  . Food insecurity:    Worry: Not on file    Inability: Not on file  . Transportation needs:    Medical: Not on file    Non-medical: Not on file  Tobacco Use  . Smoking status: Former Smoker    Packs/day: 2.00    Years: 25.00    Pack years: 50.00    Types: Cigarettes    Last attempt to quit: 07/08/1976    Years since quitting: 41.1  . Smokeless tobacco: Never Used  Substance and Sexual Activity  . Alcohol use: No    Comment: in AA since 1977, no alcohol since 1977  . Drug use: No    Types: Marijuana    Comment: marijuana use 37 years ago  . Sexual activity: Yes  Lifestyle  . Physical activity:    Days per week: Not on file    Minutes per session: Not on file  . Stress: Not on file  Relationships  . Social connections:    Talks on phone: Not on file    Gets together: Not on file    Attends religious service: Not on file    Active member of club or organization: Not on file    Attends meetings of clubs or organizations: Not on file    Relationship status: Not on file  . Intimate partner violence:    Fear of current or ex partner: Not on file    Emotionally abused: Not on file    Physically abused: Not on file    Forced sexual activity: Not on file  Other Topics Concern  . Not on file  Social History Narrative  . Not on file    Past Surgical History:  Procedure Laterality Date  . BIOPSY  09/02/2017   Procedure: BIOPSY;  Surgeon: Ronald Lobo, MD;  Location: [facility];  Service: Endoscopy;;  . CARDIOVERSION  yrs ago   prior to 1998  . COLONOSCOPY N/A  09/02/2017   Procedure: COLONOSCOPY;  Surgeon: Ronald Lobo, MD;  Location: [facility];  Service: Endoscopy;  Laterality: N/A;  . COLONOSCOPY WITH PROPOFOL N/A 01/23/2013   Procedure: COLONOSCOPY WITH PROPOFOL;  Surgeon: Garlan Fair, MD;  Location: [facility];  Service: Endoscopy;  Laterality: N/A;  . electro shock  1969   for depression  . ENTEROSCOPY Left 05/17/2016   Procedure: ENTEROSCOPY;  Surgeon: Wilford Corner, MD;  Location: [facility];  Service: Endoscopy;  Laterality: Left;  . ESOPHAGOGASTRODUODENOSCOPY N/A 03/03/2016   Procedure: ESOPHAGOGASTRODUODENOSCOPY (EGD);  Surgeon: Teena Irani, MD;  Location: [facility];  Service: Endoscopy;  Laterality: N/A;  . ESOPHAGOGASTRODUODENOSCOPY N/A 03/26/2016   Procedure: ESOPHAGOGASTRODUODENOSCOPY (EGD);  Surgeon: Teena Irani, MD;  Location: [facility];  Service: Endoscopy;  Laterality: N/A;  would like to use ultraslim scope  . ESOPHAGOGASTRODUODENOSCOPY N/A 08/31/2017   Procedure: ESOPHAGOGASTRODUODENOSCOPY (EGD);  Surgeon: Laurence Spates, MD;  Location: [facility];  Service: Endoscopy;  Laterality: N/A;  . ESOPHAGOGASTRODUODENOSCOPY (EGD) WITH PROPOFOL N/A 01/23/2013   Procedure: ESOPHAGOGASTRODUODENOSCOPY (EGD) WITH PROPOFOL;  Surgeon: Garlan Fair, MD;  [facility];  Service: Endoscopy;  Laterality: N/A;  . ESOPHAGOGASTRODUODENOSCOPY (EGD) WITH PROPOFOL N/A 04/13/2016   Procedure: ESOPHAGOGASTRODUODENOSCOPY (EGD) WITH PROPOFOL;  Surgeon: Otis Brace, MD;  Location: [facility];  Service: Gastroenterology;  Laterality: N/A;  . GIVENS CAPSULE STUDY N/A 03/03/2016   Procedure: GIVENS CAPSULE STUDY;  Surgeon: Teena Irani, MD;  [facility];  Service: Endoscopy;  Laterality: N/A;  . HEMORRHOID SURGERY N/A 12/16/2015   Procedure: PROCTOSCOPY WITH CONTROL OF BLEEDING;  Surgeon: Fanny Skates, MD;  Location: [place];  Service: General;  Laterality: N/A;  . HIP PINNING,CANNULATED Right 03/23/2017   Procedure:  CANNULATED HIP PINNING;  Surgeon: Marybelle Killings, MD;  Location: [facility];  Service: Orthopedics;  Laterality: Right;  . KNEE SURGERY  1970's   rt  . MAZE  1998  . POLYPECTOMY  09/02/2017   Procedure: POLYPECTOMY;  Surgeon: Ronald Lobo, MD;  [facility];  Service: Endoscopy;;  . PROSTATE BIOPSY    . SHOULDER SURGERY  7-8 yrs ago   rt     . allopurinol  100 mg Oral Daily  . buPROPion  150 mg Oral Daily  . cholecalciferol  1,000 Units Oral Daily  . citalopram  20 mg Oral Daily  .  diclofenac sodium  4 g Topical QID  . diltiazem  120 mg Oral Daily  . feeding supplement  237 mL Oral BID BM  . furosemide  40 mg Oral Daily  . guaiFENesin  1,200 mg Oral BID  . insulin aspart  0-15 Units Subcutaneous TID WC  . insulin aspart  0-5 Units Subcutaneous QHS  . ipratropium  0.5 mg Nebulization BID  . iron polysaccharides  150 mg Oral BID  . levalbuterol  0.63 mg Nebulization BID  . pantoprazole  40 mg Oral BID  . potassium chloride SA  20 mEq Oral Daily  . pravastatin  20 mg Oral q1800  . senna-docusate  2 tablet Oral BID  . temazepam  15 mg Oral QHS     Physical Exam: Blood pressure (!) 145/91, pulse 69, temperature 98.4 F (36.9 C), resp. rate 20, height 6' (1.829 m), weight 240 lb (108.9 kg), SpO2 92 %.    Affect appropriate Pale chronically ill white male HEENT: normal Neck supple with no adenopathy JVP normal no bruits no thyromegaly Lungs clear with no wheezing and good diaphragmatic motion Heart:  S1/S2 AS  murmur, no rub, gallop or click PMI normal Abdomen: benighn, BS positve, no tenderness, no AAA no bruit.  No HSM or HJR Distal pulses intact with no bruits Plus 2 LE edema with erythema  Neuro non-focal Skin warm and dry No muscular weakness  Labs:   Lab Results  Component Value Date   WBC 4.1 09/03/2017   HGB 8.8 (L) 09/03/2017   HCT 28.0 (L) 09/03/2017   MCV 94.6 09/03/2017   PLT 180 09/03/2017    Recent Labs  Lab 09/03/17 0102  NA 138  K 4.2    CL 103  CO2 26  BUN 19  CREATININE 1.15  CALCIUM 9.1  PROT 6.9  BILITOT 0.6  ALKPHOS 55  ALT 10  AST 16  GLUCOSE 131*   Lab Results  Component Value Date   CKTOTAL 75 09/15/2010   CKMB 2.5 09/15/2010   TROPONINI <0.03 06/29/2015    Lab Results  Component Value Date   CHOL 102 06/29/2015   CHOL 137 09/15/2010   Lab Results  Component Value Date   HDL 31 (L) 06/29/2015   HDL 38 (L) 09/15/2010   Lab Results  Component Value Date   LDLCALC 49 06/29/2015   LDLCALC 67 09/15/2010   Lab Results  Component Value Date   TRIG 108 06/29/2015   TRIG 158 (H) 09/15/2010   Lab Results  Component Value Date   CHOLHDL 3.3 06/29/2015   CHOLHDL 3.6 09/15/2010   No results found for: LDLDIRECT    Radiology: Ct Chest W Contrast  Result Date: 09/02/2017 CLINICAL DATA:  84 year old with recent diagnosis of colon cancer in the cecum. Patient presented with anemia and had heme-positive stools. Initial staging CT. EXAM: CT CHEST, ABDOMEN, AND PELVIS WITH CONTRAST TECHNIQUE: Multidetector CT imaging of the chest, abdomen and pelvis was performed following the standard protocol during bolus administration of intravenous contrast. CONTRAST:  13mL ISOVUE-300 IOPAMIDOL INJECTION 61% IV. Oral contrast was also administered. COMPARISON:  No prior CT chest, abdomen or pelvis. RIGHT hip CT 03/23/2017 is correlated. FINDINGS: CT CHEST FINDINGS Cardiovascular: Prior CABG. The coronary graft arising from the ANTERIOR aspect of the aorta is difficult to visualize. Heart moderately enlarged. No pericardial effusion. Severe aortic valvular calcification. Severe three-vessel coronary atherosclerosis. Severe atherosclerosis involving the thoracic aorta without evidence of aneurysm. Atherosclerosis involving the proximal great vessels. Central pulmonary  arteries patent. Mediastinum/Nodes: No pathologically enlarged mediastinal, hilar or axillary lymph nodes. No mediastinal masses. Normal-appearing esophagus.  Normal-appearing thyroid gland. Lungs/Pleura: Emphysematous changes throughout both lungs. Moderately large RIGHT pleural effusion and small LEFT pleural effusion. No enhancing pleural masses. Airspace consolidation with air bronchograms involving the RIGHT LOWER LOBE. Mild passive atelectasis involving the LEFT LOWER LOBE. No pulmonary parenchymal nodules or masses. Central airways patent with mild bronchial wall thickening. Mild LEFT LOWER LOBE bronchiectasis. Musculoskeletal: Degenerative changes throughout the thoracic spine. No acute findings. CT ABDOMEN PELVIS FINDINGS Hepatobiliary: Unopacified hepatic veins. Liver upper normal in size. Mildly irregular hepatic contour. No convincing focal hepatic parenchymal masses. Numerous gallstones in the gallbladder, including cholesterol gallstones, the largest measuring approximately 2.6 cm. No pericholecystic edema or inflammation. Pancreas: Normal in appearance without evidence of mass, ductal dilation, or inflammation. Spleen: Normal in size and appearance. Focus of accessory splenic tissue MEDIAL to the spleen at its ANTERIOR tip. Adrenals/Urinary Tract: Normal appearing adrenal glands. Kidneys normal in size and appearance without focal parenchymal abnormality. No hydronephrosis. No evidence of urinary tract calculi. Normal appearing urinary bladder. Stomach/Bowel: Stomach decompressed and unremarkable. Normal-appearing small bowel. Circumferential mass involving the cecum extending over an approximate 5 cm length, without evidence of obstruction. Entire colon relatively decompressed. Distal descending and sigmoid colon diverticulosis without evidence of acute diverticulitis. Sigmoid colon tortuous and elongated. Normal appearing decompressed appendix in the RIGHT UPPER pelvis. Vascular/Lymphatic: Severe aortoiliofemoral and visceral artery atherosclerosis without evidence of aneurysm. No pathologic lymphadenopathy. Reproductive: Normal sized prostate gland  containing metallic fiducial markers. Atrophic seminal vesicles. Other: Small BILATERAL inguinal hernias containing fat. Musculoskeletal: Multilevel degenerative disc disease, spondylosis and facet degenerative changes throughout the lumbar spine with severe multifactorial spinal stenosis at L3-4 and moderate to severe multifactorial spinal stenosis at L4-5. No acute findings. IMPRESSION: CT Chest: 1. No evidence of metastatic disease in the chest. 2. Moderate-sized RIGHT pleural effusion and small LEFT pleural effusion. Passive atelectasis and/or pneumonia involving the RIGHT LOWER LOBE and mild passive atelectasis involving the LEFT LOWER LOBE. 3.  No acute cardiopulmonary disease otherwise. 4. Moderate cardiomegaly. Severe three-vessel coronary atherosclerosis. Severe aortic valvular calcification. CT Abdomen Pelvis: 1. Cecal mass which extends over an approximate 5 cm segment. 2. No evidence of metastatic disease in the abdomen or pelvis. 3. Query hepatic cirrhosis. 4. Cholelithiasis without evidence of acute cholecystitis. 5. Distal descending and sigmoid colon diverticulosis without evidence of acute diverticulitis. Aortic Atherosclerosis (ICD10-I70.0) and Emphysema (ICD10-J43.9). Electronically Signed   By: Evangeline Dakin M.D.   On: 09/02/2017 18:01   Ct Abdomen Pelvis W Contrast  Result Date: 09/02/2017 CLINICAL DATA:  84 year old with recent diagnosis of colon cancer in the cecum. Patient presented with anemia and had heme-positive stools. Initial staging CT. EXAM: CT CHEST, ABDOMEN, AND PELVIS WITH CONTRAST TECHNIQUE: Multidetector CT imaging of the chest, abdomen and pelvis was performed following the standard protocol during bolus administration of intravenous contrast. CONTRAST:  124mL ISOVUE-300 IOPAMIDOL INJECTION 61% IV. Oral contrast was also administered. COMPARISON:  No prior CT chest, abdomen or pelvis. RIGHT hip CT 03/23/2017 is correlated. FINDINGS: CT CHEST FINDINGS Cardiovascular: Prior  CABG. The coronary graft arising from the ANTERIOR aspect of the aorta is difficult to visualize. Heart moderately enlarged. No pericardial effusion. Severe aortic valvular calcification. Severe three-vessel coronary atherosclerosis. Severe atherosclerosis involving the thoracic aorta without evidence of aneurysm. Atherosclerosis involving the proximal great vessels. Central pulmonary arteries patent. Mediastinum/Nodes: No pathologically enlarged mediastinal, hilar or axillary lymph nodes. No mediastinal masses. Normal-appearing esophagus.  Normal-appearing thyroid gland. Lungs/Pleura: Emphysematous changes throughout both lungs. Moderately large RIGHT pleural effusion and small LEFT pleural effusion. No enhancing pleural masses. Airspace consolidation with air bronchograms involving the RIGHT LOWER LOBE. Mild passive atelectasis involving the LEFT LOWER LOBE. No pulmonary parenchymal nodules or masses. Central airways patent with mild bronchial wall thickening. Mild LEFT LOWER LOBE bronchiectasis. Musculoskeletal: Degenerative changes throughout the thoracic spine. No acute findings. CT ABDOMEN PELVIS FINDINGS Hepatobiliary: Unopacified hepatic veins. Liver upper normal in size. Mildly irregular hepatic contour. No convincing focal hepatic parenchymal masses. Numerous gallstones in the gallbladder, including cholesterol gallstones, the largest measuring approximately 2.6 cm. No pericholecystic edema or inflammation. Pancreas: Normal in appearance without evidence of mass, ductal dilation, or inflammation. Spleen: Normal in size and appearance. Focus of accessory splenic tissue MEDIAL to the spleen at its ANTERIOR tip. Adrenals/Urinary Tract: Normal appearing adrenal glands. Kidneys normal in size and appearance without focal parenchymal abnormality. No hydronephrosis. No evidence of urinary tract calculi. Normal appearing urinary bladder. Stomach/Bowel: Stomach decompressed and unremarkable. Normal-appearing small  bowel. Circumferential mass involving the cecum extending over an approximate 5 cm length, without evidence of obstruction. Entire colon relatively decompressed. Distal descending and sigmoid colon diverticulosis without evidence of acute diverticulitis. Sigmoid colon tortuous and elongated. Normal appearing decompressed appendix in the RIGHT UPPER pelvis. Vascular/Lymphatic: Severe aortoiliofemoral and visceral artery atherosclerosis without evidence of aneurysm. No pathologic lymphadenopathy. Reproductive: Normal sized prostate gland containing metallic fiducial markers. Atrophic seminal vesicles. Other: Small BILATERAL inguinal hernias containing fat. Musculoskeletal: Multilevel degenerative disc disease, spondylosis and facet degenerative changes throughout the lumbar spine with severe multifactorial spinal stenosis at L3-4 and moderate to severe multifactorial spinal stenosis at L4-5. No acute findings. IMPRESSION: CT Chest: 1. No evidence of metastatic disease in the chest. 2. Moderate-sized RIGHT pleural effusion and small LEFT pleural effusion. Passive atelectasis and/or pneumonia involving the RIGHT LOWER LOBE and mild passive atelectasis involving the LEFT LOWER LOBE. 3.  No acute cardiopulmonary disease otherwise. 4. Moderate cardiomegaly. Severe three-vessel coronary atherosclerosis. Severe aortic valvular calcification. CT Abdomen Pelvis: 1. Cecal mass which extends over an approximate 5 cm segment. 2. No evidence of metastatic disease in the abdomen or pelvis. 3. Query hepatic cirrhosis. 4. Cholelithiasis without evidence of acute cholecystitis. 5. Distal descending and sigmoid colon diverticulosis without evidence of acute diverticulitis. Aortic Atherosclerosis (ICD10-I70.0) and Emphysema (ICD10-J43.9). Electronically Signed   By: Evangeline Dakin M.D.   On: 09/02/2017 18:01   Dg Chest Port 1 View  Result Date: 09/01/2017 CLINICAL DATA:  84 y.o. male with medical history significant for CAD, Afib  not on anticoagulation due to history of recurrent GI bleed felt to be from small gastric AVMs causing iron deficiency anemia. EXAM: PORTABLE CHEST 1 VIEW COMPARISON:  03/22/2017 FINDINGS: Median sternotomy. The heart is enlarged. There is perihilar airspace filling and prominent interstitial markings consistent with pulmonary edema. No focal consolidations. IMPRESSION: Cardiomegaly and mild pulmonary edema. Electronically Signed   By: Nolon Nations M.D.   On: 09/01/2017 16:18    EKG: Afib rate 106 nonspecific ST changes    ASSESSMENT AND PLAN:   Preoperative Cardiac Evaluation:  Clearly patient is high risk Little choice but to proceed with surgery Monday with anemia , potential for metastatic spread and obstruction. Looking back through chart no documented CAD with low risk myovue 2015. Afib is chronic and can be rate controlled. AS is moderate and not critical. Functional status is very poor and has oxygen dependant COPD. Post op course and rehab will  be long. Continue cardizem for rate control and diuretic to keep I/O's slightly negative Both will need to be changed to iv post op.   SignedJenkins Rouge 09/03/2017, 9:07 AM

## 2017-09-03 NOTE — Evaluation (Signed)
OT Cancellation Note  Patient Details Name: Joel [name] MRN: [id number] DOB: 05-21-1932   Cancelled Treatment:     Met with pt sitting up in recliner this date. Obtained hx of pt, using w/c at baseline and functional throughout the home. Pt with extensive DME in the home to increase functional independence (shower chair, BSC, reacher, sock aide, long handled sponge, w/c). Pt explains he is very familiar with OT and pleasantly defers further initiation of services, despite education. Pt shares he was actively receiving OPPT and was recently d/c from [place] and will continue with this POC. Noted per chart review of PT note pt completing functional t/fs with mod I assist. Pt voices recall of safety in completing higher level ADLs in the home. OT will sign off this date, no acute needs identified. Reconsult welcomed if further changes arise.  Zenovia Jarred, MSOT, OTR/L  [place] 09/03/2017, 1:18 PM

## 2017-09-03 NOTE — Progress Notes (Signed)
Colon cancer  Subjective: Pt feeling well.  CT's show R pleural effusion but no other signs of possible metastatic disease.  No obstruction.  Objective: Vital signs in last 24 hours: Temp:  [98 F (36.7 C)-98.4 F (36.9 C)] 98.4 F (36.9 C) (08/03 0510) Pulse Rate:  [69-85] 69 (08/03 0510) Resp:  [15-25] 20 (08/03 0510) BP: (90-145)/(58-91) 145/91 (08/03 0510) SpO2:  [92 %-98 %] 92 % (08/03 0718) Last BM Date: 08/29/17  Intake/Output from previous day: 08/02 0701 - 08/03 0700 In: 880 [P.O.:480; I.V.:400] Out: 2275 [Urine:2275] Intake/Output this shift: Total I/O In: 240 [P.O.:240] Out: 200 [Urine:200]  General appearance: alert and cooperative GI: normal findings: soft, non-tender  Lab Results:  Results for orders placed or performed during the hospital encounter of 08/30/17 (from the past 24 hour(s))  CEA     Status: None   Collection Time: 09/02/17  1:58 PM  Result Value Ref Range   CEA 4.4 0.0 - 4.7 ng/mL  Glucose, capillary     Status: Abnormal   Collection Time: 09/02/17  3:20 PM  Result Value Ref Range   Glucose-Capillary 142 (H) 70 - 99 mg/dL  Glucose, capillary     Status: Abnormal   Collection Time: 09/02/17  4:41 PM  Result Value Ref Range   Glucose-Capillary 126 (H) 70 - 99 mg/dL  Hemoglobin and hematocrit, blood     Status: Abnormal   Collection Time: 09/02/17  6:10 PM  Result Value Ref Range   Hemoglobin 9.0 (L) 13.0 - 17.0 g/dL   HCT 29.1 (L) 39.0 - 52.0 %  Glucose, capillary     Status: Abnormal   Collection Time: 09/02/17  8:23 PM  Result Value Ref Range   Glucose-Capillary 163 (H) 70 - 99 mg/dL  CBC with Differential/Platelet     Status: Abnormal   Collection Time: 09/03/17  1:02 AM  Result Value Ref Range   WBC 4.1 4.0 - 10.5 K/uL   RBC 2.96 (L) 4.22 - 5.81 MIL/uL   Hemoglobin 8.8 (L) 13.0 - 17.0 g/dL   HCT 28.0 (L) 39.0 - 52.0 %   MCV 94.6 78.0 - 100.0 fL   MCH 29.7 26.0 - 34.0 pg   MCHC 31.4 30.0 - 36.0 g/dL   RDW 16.4 (H) 11.5 - 15.5  %   Platelets 180 150 - 400 K/uL   Neutrophils Relative % 70 %   Neutro Abs 2.9 1.7 - 7.7 K/uL   Lymphocytes Relative 13 %   Lymphs Abs 0.5 (L) 0.7 - 4.0 K/uL   Monocytes Relative 9 %   Monocytes Absolute 0.4 0.1 - 1.0 K/uL   Eosinophils Relative 7 %   Eosinophils Absolute 0.3 0.0 - 0.7 K/uL   Basophils Relative 1 %   Basophils Absolute 0.0 0.0 - 0.1 K/uL  Comprehensive metabolic panel     Status: Abnormal   Collection Time: 09/03/17  1:02 AM  Result Value Ref Range   Sodium 138 135 - 145 mmol/L   Potassium 4.2 3.5 - 5.1 mmol/L   Chloride 103 98 - 111 mmol/L   CO2 26 22 - 32 mmol/L   Glucose, Bld 131 (H) 70 - 99 mg/dL   BUN 19 8 - 23 mg/dL   Creatinine, Ser 1.15 0.61 - 1.24 mg/dL   Calcium 9.1 8.9 - 10.3 mg/dL   Total Protein 6.9 6.5 - 8.1 g/dL   Albumin 3.7 3.5 - 5.0 g/dL   AST 16 15 - 41 U/L   ALT 10 0 -  44 U/L   Alkaline Phosphatase 55 38 - 126 U/L   Total Bilirubin 0.6 0.3 - 1.2 mg/dL   GFR calc non Af Amer 57 (L) >60 mL/min   GFR calc Af Amer >60 >60 mL/min   Anion gap 9 5 - 15  Magnesium     Status: None   Collection Time: 09/03/17  1:02 AM  Result Value Ref Range   Magnesium 2.2 1.7 - 2.4 mg/dL  Phosphorus     Status: None   Collection Time: 09/03/17  1:02 AM  Result Value Ref Range   Phosphorus 4.0 2.5 - 4.6 mg/dL  Glucose, capillary     Status: Abnormal   Collection Time: 09/03/17  7:44 AM  Result Value Ref Range   Glucose-Capillary 123 (H) 70 - 99 mg/dL     Studies/Results Radiology     MEDS, Scheduled . allopurinol  100 mg Oral Daily  . buPROPion  150 mg Oral Daily  . cholecalciferol  1,000 Units Oral Daily  . citalopram  20 mg Oral Daily  . diclofenac sodium  4 g Topical QID  . diltiazem  120 mg Oral Daily  . feeding supplement  237 mL Oral BID BM  . furosemide  80 mg Oral Daily  . guaiFENesin  1,200 mg Oral BID  . insulin aspart  0-15 Units Subcutaneous TID WC  . insulin aspart  0-5 Units Subcutaneous QHS  . ipratropium  0.5 mg  Nebulization BID  . iron polysaccharides  150 mg Oral BID  . levalbuterol  0.63 mg Nebulization BID  . pantoprazole  40 mg Oral BID  . potassium chloride SA  20 mEq Oral Daily  . pravastatin  20 mg Oral q1800  . senna-docusate  2 tablet Oral BID  . temazepam  15 mg Oral QHS     Assessment: Cecal cancer, acute on chronic blood loss anemia  Plan: OR Mon or Tues for R colectomy.  Per cardiology, pt is high risk but has no other options available.  Dr Dalbert Batman will discuss with pt further on Mon AM.     LOS: 4 days    Rosario Adie, MD [facility], [place]   09/03/2017 10:39 AM

## 2017-09-03 NOTE — Progress Notes (Signed)
PROGRESS NOTE    LEVEON [name]  TIR:[id number] DOB: 04-Sep-1932 DOA: 08/30/2017 PCP: Wenda Low, MD   Brief Narrative: Joel [name] is a 84 y.o. male with medical history significant for CAD, Afib not on anticoagulation due to history of recurrent GI bleed felt to be from small gastric AVMs causing iron deficiency anemia. He required several transfusions last year, but has since been doing well. He has been following up with Hematology but has not followed up there since March 2019 it seems. The plan was for him to follow up monthlly with transfusions to keep Hb above 9. He has been doing well, but in the last 2-3 weeks he noted some abdominal bloating as well as weaness. Yesterday he want for a bone density scan and asked that his hemoglobin be checked, he was called to come to ER today due to Hb of 7. Denies nausea, abdominal pain, reflux, or BRBPR. He has very dark stools but he takes iron BID.  He has had some SOB with exertion, but no chest pain, dizziness or syncope.  In the ER repeat lab confirmed Hb 7, two units of blood have been ordered and Hemoccult was positive for blood. Hospitalist was asked to admit, and a call has been placed to GI for inpatient consultation. Patient underwent EGD which showed Gastritis, Duodenitis, and Esophageal Candidiasis. Repeat Hb improved to 9.0 but dropped again so GI did colonoscopy which showed a Cecal Mass so General Surgery was consulted.   Patient complained of Dyspnea so was given IV lasix on 09/01/17 and started on Breathing treatments with improvement ECHOCardiogram being checked. He appeared to be volume overloaded with a mild Diastolic CHF Exacerbation so lasix was increased. Cardiology was consulted for preoperative clearance.  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  Localized mild inflammation characterized by erosions and erythema was found in the duodenal bulb -Continue to Monitor for S/Sx of Bleeding -C/w Iron Polysaccharides 150 mg po BID -Protonix 40 mg po Daily increased to 40 mg po BID by Gastroenterology  -Will stop Monitoring Hb/Hct q8h -GI took the patient for colonoscopy this AM as his Hb/Hc was dropping and because he has not had a colonoscopy in 5 years. -Colonoscopy revealed an ulcerated, likely malignant non-obstructing medium-sized mass in the cecum and a 6 mm polyp in the rectum that was sessile along with sigmoid colon diverticula -GI feels cecal mass is strongly suggestive of cancer and likely accounts for chronic blood loss; Pathology Confirms Adenocarcinoma  -Continue to Monitor and repeat CBC in AM   DMType2 complicated by Diabetic Neuropathy in feet -HbA1c was 6.0  -Continue to Monitor CBG's -Hold Metformin 1000 mg po BID and Glimepiride 1 mg po Daily with Breakfast  -C/w Gabapentin  -Continued  Moderate Novolog SSI q4h while NPO for possible GI Intervention but will change to Moderate AC/HS now that  patient is on a Heart Healthy/Carb Modified Diet  -CBG's ranging from 123-165; Continue to Monitor CBG's closely   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 -Check CT Chest/Abd/Pelvis with Contrast to evaluate for Mets  -GI consulted General Surgery who is to evaluate and recommending likely resection and would plan to proceed  early next week -Per Surgery will need to remain on Clear Liquid Diet throughouth the weekend -Surgery requesting Cardiology to evaluate him for Pre-Surgical Risk Stratification   Right Pleural Effusion -CT Scan showed Moderate Fluid -Obtain Thoracentesis; Done and obtained 650 mL of Clear Gold Fluid  -C/w Diuresis and increased to 80 mg po Daily and given 1 dose of IV Lasix  pHTN -Increased Lasix 40 mg Daily to 80 mg po Daily (Home Dose);  -Given IV Lasix previously and will restart today    HTN -C/w Diltiazem 120 mg po Daily, Hold Losartan 25 mg po Daily for now currently that he got diuresed and will get IV Contrast    Chronic GIB -As Above; Likely from Cecal Mass   Persistent Atrial Fibrillation -s/p Multiple DCCV -C/w Diltiazem 120 mg po Daily -CHADS2VASc Elevated of 4 but not on Anticoagulation due chronic GIB -Per Cardiology should have ben on Baby ASA but stopped.  -Cardiology Consulted for Pre-Operative Clearance; recommending continuing Cardizem for Rate control and states will need to be changed to IV post-op  Aortic Stenosis  -Was Moderate on last ECHO at Cardiology office -Per Cardiology monitor clinically and follow with repeat [facility] though he is a poor candidate in the future even probably for TAVR -Repeat ECHO this Visit as below  -Cardiology Consulted for Pre-Op Clearance and patient is a High Risk   COPD on Home O2 -C/w 2 liters of O2 via [place]; Was not wearing it so desaturated -May have a mild Exacerbation because he felt dyspneic so will start patient on Xopenex/Atrovent q6h and place on Guaifenesin 1200 mg po BID  -C/w Dextromethorphan-Guaifenesin 30-600 mg po q12h  Bipolar Affective Disorder -C/w Citalopram but to 20 mg po Daily and Bupropion 150 mg po Daily  -C/w Temazepam 15 mg po qHS  OSA -Ordered CPAP -Was placed on BiPAP 16/12 with 2 Liters of O2 Humidified overnight   Esophageal Candidiasis -Noted on EGD with plaques consistent with  Candidiasis -Started on Fluconazole 100 mg po TID x3 days per GI and is to stopped 09/02/17  HLD/Dyslipidemia  -C/w Lovastatin 20 mg po Daily qHS   Gout -C/w Home Allopurinol 100 mg po Daily   Recent Right Hip Fracture -PT/OT to Evaluate and Treat -X-Ray on 06/07/17 showed: "Standing AP pelvis and frog-leg lateral x-ray demonstrates cannulated screw fixation femoral neck fracture with slight backout of cannulated screws unchanged from March 2019. No evidence of AVN and no displacement with interval healing of the femoral neck fracture.  Impression: Satisfactory cannulated screw fixation femoral neck fracture  with good position." -Dr Lorin Mercy recommended Ambulation and LE Strengthening   CKD Stage 3 -Cr appears to be elevated since the beginning of the year -Maybe new Baseline; BUN/Cr now 19/1.15 -BUN/Cr stable compared to prior values this year -Avoid Nephrotoxic Medications if possible and monitor closely given that patient is to receive contrast dye for CT Scans  -Repeat CMP in AM   BPPV -C/w Home Meclizine 25 mg po TIDprn  Dyspnea, improved  -Likely Multifactorial and in the setting of COPD, Anemia and mild volume overload with Acute on Chronic Diastolic CHF, and Right Plueral Effusion -Bronchodilators as above -Check ECHOCardiogram  and Chest X-Ray -ECHOCardiogram showed the estimated  ejection fraction was in the range of 55% to 60%. The study is not technically sufficient to allow evaluation of LV diastolic function. -CXR showed Cardiomegaly and mild pulmonary edema. -Lasix as above -Flutter Valve, Incentive Spirometry, and Guaifenesin -Obtain Right Thoracentesis   Acute on Chronic Diastolic CHF, mild Exacerbation -Had 1+ LE Edema and crackles -Increased po Lasix to 80 mg Daily -Started IV Lasix -Strict I's/O's, Daily Weights -Cardiology Consulted for Pre-Op Clearance -Continue to Monitor Volume Status   DVT prophylaxis: SCDs given concern for Blood Loss Anemia Code  Status: FULL CODE Family Communication: No family present at bedside Disposition Plan: Pending Further workup and evaluation by General Surgery  Consultants:   [facility] Gastroenterology (signed off)  General Surgery   Cardiology    Procedures:  EGD Findings:      Diffuse, white plaques were found in the mid esophagus.      Diffuse moderate inflammation characterized by erosions, erythema and       friability was found in the entire examined stomach.      Localized mild inflammation characterized by erosions and erythema was       found in the duodenal bulb. Impression:               - Esophageal plaques were found, consistent with                            candidiasis. Due to the patient's history of                            diabetes and the fact that he had been bleeding                            this was not biopsy                           - Gastritis.                           - Duodenitis.                           - No specimens collected.  ECHOCARDIOGRAM ------------------------------------------------------------------- Study Conclusions  - Left ventricle: Wall thickness was increased in a pattern of   moderate LVH. Systolic function was normal. The estimated   ejection fraction was in the range of 55% to 60%. The study is   not technically sufficient to allow evaluation of LV diastolic   function. - Aortic valve: There was moderate stenosis. Valve area (VTI): 1.57   cm^2. Valve area (Vmax): 1.74 cm^2. Valve area (Vmean): 1.53   cm^2. - Mitral valve: Moderately calcified annulus. Mildly thickened,   mildly calcified leaflets . There was mild regurgitation. Valve   area by continuity equation (using LVOT flow): 2.59 cm^2. - Left atrium: The atrium was moderately dilated. - Atrial septum: No defect or patent foramen ovale was identified. - Pulmonary arteries: PA peak pressure: 65 mm Hg (S). - Impressions: Gradients have increased since 2017 when they were    mean 16 mmHg and peak 26 mmHg.  Impressions:  - Gradients have increased since 2017 when they were mean 16 mmHg   and peak 26 mmHg.  COLONOSCOPY Findings:      The digital rectal exam findings include flat prostate bed.      An ulcerated non-obstructing medium-sized MASS was found in the cecum.       The mass was non-circumferential. No bleeding was present. This was       biopsied with a cold forceps for histology. Estimated blood loss was       minimal.      A 6 mm polyp was found in the rectum (benign-appearing lesion). The       polyp was sessile. The polyp was removed with a cold snare. Resection       and retrieval were complete. Estimated blood loss: 3 mL.      Multiple large-mouthed diverticula were found in the sigmoid colon.      The exam was otherwise normal throughout the examined colon.      The terminal ileum appeared normal.      The retroflexed view of the distal rectum and anal verge was normal and       showed no anal or rectal abnormalities. Impression:               - Flat prostate bed found on digital rectal exam.                           - LIKELY MALIGNANT MASS in the cecum. Biopsied.                            This could well account for patient's                            recurrent/ongoing low grade blood loss.                           - One benign appearing 6 mm polyp in the rectum,                            removed with a cold snare. Resected and retrieved.                           - Diverticulosis in the sigmoid colon.                           - The examined portion of the ileum was normal.                           - The distal rectum and anal verge are normal on                            retroflexion view.   Antimicrobials:  Anti-infectives (From admission, onward)   Start     Dose/Rate Route Frequency Ordered Stop   08/31/17 1100  fluconazole (DIFLUCAN) tablet 100 mg     100 mg Oral Daily 08/31/17 0921 09/02/17 1726     Subjective: Seen  and examined at bedside and feels hopeful that all the cancer can be removed.  States he is mildly short of breath and states that he feels a little bit more volume overloaded today with increased  leg swelling.  Denies any chest pain, lightheadedness or dizziness.  No nausea or vomiting.  No other concerns or complaints at this time and understands that he needs to have fluid removed from his lung.   Objective: Vitals:   09/02/17 1947 09/02/17 2006 09/03/17 0510 09/03/17 0718  BP: 140/85  (!) 145/91   Pulse: 69  69   Resp: (!) 22  20   Temp: 98.1 F (36.7 C)  98.4 F (36.9 C)   TempSrc: Oral     SpO2: 95% 98% 94% 92%  Weight:      Height:        Intake/Output Summary (Last 24 hours) at 09/03/2017 0802 Last data filed at 09/03/2017 0411 Gross per 24 hour  Intake 880 ml  Output 2275 ml  Net -1395 ml   Filed Weights   08/30/17 1720 08/30/17 1724 08/30/17 2240  Weight: 108.9 kg (240 lb) 108.9 kg (240 lb) 108.9 kg (240 lb)   Examination: Physical Exam:  Constitutional: Well-nourished, well-developed obese Caucasian male is currently no acute distress and sitting in chair bedside and hopeful about his surgery Eyes: Sclera anicteric.  Lids and conjunctive are normal ENMT: External ears and nose appear normal Neck: Appears supple with no JVD Respiratory: Diminished to auscultation bilaterally with mild crackles and coarse breath sounds but no appreciable wheezing, rales, rhonchi.  Was not wearing supplemental oxygen via nasal cannula this morning when I went to see him and has unlabored breathing Cardiovascular: Irregularly irregular.  Has had 3 out of 6 systolic murmur.  Has 1+ lower extremity edema noted and slightly worse than yesterday Abdomen: Soft, nontender, slightly distended secondary body habitus.  Bowel sounds present all 4 quadrants GU: Deferred Musculoskeletal: No contractures or cyanosis.  No joint deformities noted Skin: Skin is warm and dry no appreciable rashes or lesions  limited skin evaluation Neurologic: Cranial nerves II through XII grossly intact no appreciable focal deficits Psychiatric: Normal mood and affect.  Intact judgment insight.  Patient is awake alert and oriented x3  Data Reviewed: I have personally reviewed following labs and imaging studies  CBC: Recent Labs  Lab 08/30/17 1744 08/31/17 0647  09/01/17 0037  09/01/17 1744 09/02/17 0054 09/02/17 0804 09/02/17 1810 09/03/17 0102  WBC 5.1 5.6  --  6.6  --   --  4.5  --   --  4.1  NEUTROABS  --   --   --  5.2  --   --  3.2  --   --  2.9  HGB 7.3* 8.9*   < > 8.4*   < > 8.9* 8.9* 8.7* 9.0* 8.8*  HCT 24.2* 27.9*   < > 27.2*   < > 28.6* 28.4* 27.7* 29.1* 28.0*  MCV 96.4 94.3  --  94.1  --   --  95.0  --   --  94.6  PLT 196 168  --  177  --   --  188  --   --  180   < > = values in this interval not displayed.   Basic Metabolic Panel: Recent Labs  Lab 08/30/17 1744 08/31/17 0647 09/01/17 0037 09/02/17 0054 09/03/17 0102  NA 141 142 142 145 138  K 4.4 4.5 4.3 4.2 4.2  CL 106 107 107 107 103  CO2 26 25 26 30 26   GLUCOSE 186* 141* 174* 140* 131*  BUN 26* 25* 24* 21 19  CREATININE 1.76* 1.40* 1.31* 1.18 1.15  CALCIUM 9.3 9.4 8.8* 9.2 9.1  MG  --   --  2.4 2.3 2.2  PHOS  --   --  4.2 3.9 4.0   GFR: Estimated Creatinine Clearance: 63.1 mL/min (by C-G formula based on SCr of 1.15 mg/dL). Liver Function Tests: Recent Labs  Lab 09/01/17 0037 09/02/17 0054 09/03/17 0102  AST 15 13* 16  ALT 12 12 10   ALKPHOS 60 59 55  BILITOT 0.8 0.9 0.6  PROT 6.8 7.1 6.9  ALBUMIN 3.6 3.6 3.7   No results for input(s): LIPASE, AMYLASE in the last 168 hours. No results for input(s): AMMONIA in the last 168 hours. Coagulation Profile: Recent Labs  Lab 08/30/17 2135  INR 1.07   Cardiac Enzymes: No results for input(s): CKTOTAL, CKMB, CKMBINDEX, TROPONINI in the last 168 hours. BNP (last 3 results) No results for input(s): PROBNP in the last 8760 hours. HbA1C: No results for input(s):  HGBA1C in the last 72 hours. CBG: Recent Labs  Lab 09/02/17 0745 09/02/17 1520 09/02/17 1641 09/02/17 2023 09/03/17 0744  GLUCAP 136* 142* 126* 163* 123*   Lipid Profile: No results for input(s): CHOL, HDL, LDLCALC, TRIG, CHOLHDL, LDLDIRECT in the last 72 hours. Thyroid Function Tests: No results for input(s): TSH, T4TOTAL, FREET4, T3FREE, THYROIDAB in the last 72 hours. Anemia Panel: No results for input(s): VITAMINB12, FOLATE, FERRITIN, TIBC, IRON, RETICCTPCT in the last 72 hours. Sepsis Labs: No results for input(s): PROCALCITON, LATICACIDVEN in the last 168 hours.  No results found for this or any previous visit (from the past 240 hour(s)).   Radiology Studies: Ct Chest W Contrast  Result Date: 09/02/2017 CLINICAL DATA:  84 year old with recent diagnosis of colon cancer in the cecum. Patient presented with anemia and had heme-positive stools. Initial staging CT. EXAM: CT CHEST, ABDOMEN, AND PELVIS WITH CONTRAST TECHNIQUE: Multidetector CT imaging of the chest, abdomen and pelvis was performed following the standard protocol during bolus administration of intravenous contrast. CONTRAST:  174mL ISOVUE-300 IOPAMIDOL INJECTION 61% IV. Oral contrast was also administered. COMPARISON:  No prior CT chest, abdomen or pelvis. RIGHT hip CT 03/23/2017 is correlated. FINDINGS: CT CHEST FINDINGS Cardiovascular: Prior CABG. The coronary graft arising from the ANTERIOR aspect of the aorta is difficult to visualize. Heart moderately enlarged. No pericardial effusion. Severe aortic valvular calcification. Severe three-vessel coronary atherosclerosis. Severe atherosclerosis involving the thoracic aorta without evidence of aneurysm. Atherosclerosis involving the proximal great vessels. Central pulmonary arteries patent. Mediastinum/Nodes: No pathologically enlarged mediastinal, hilar or axillary lymph nodes. No mediastinal masses. Normal-appearing esophagus. Normal-appearing thyroid gland. Lungs/Pleura:  Emphysematous changes throughout both lungs. Moderately large RIGHT pleural effusion and small LEFT pleural effusion. No enhancing pleural masses. Airspace consolidation with air bronchograms involving the RIGHT LOWER LOBE. Mild passive atelectasis involving the LEFT LOWER LOBE. No pulmonary parenchymal nodules or masses. Central airways patent with mild bronchial wall thickening. Mild LEFT LOWER LOBE bronchiectasis. Musculoskeletal: Degenerative changes throughout the thoracic spine. No acute findings. CT ABDOMEN PELVIS FINDINGS Hepatobiliary: Unopacified hepatic veins. Liver upper normal in size. Mildly irregular hepatic contour. No convincing focal hepatic parenchymal masses. Numerous gallstones in the gallbladder, including cholesterol gallstones, the largest measuring approximately 2.6 cm. No pericholecystic edema or inflammation. Pancreas: Normal in appearance without evidence of mass, ductal dilation, or inflammation. Spleen: Normal in size and appearance. Focus of accessory splenic tissue MEDIAL to the spleen at its ANTERIOR tip. Adrenals/Urinary Tract: Normal appearing adrenal glands. Kidneys normal in size and appearance without focal parenchymal abnormality. No hydronephrosis. No evidence of urinary tract calculi. Normal appearing urinary bladder. Stomach/Bowel: Stomach decompressed and unremarkable. Normal-appearing small bowel. Circumferential  mass involving the cecum extending over an approximate 5 cm length, without evidence of obstruction. Entire colon relatively decompressed. Distal descending and sigmoid colon diverticulosis without evidence of acute diverticulitis. Sigmoid colon tortuous and elongated. Normal appearing decompressed appendix in the RIGHT UPPER pelvis. Vascular/Lymphatic: Severe aortoiliofemoral and visceral artery atherosclerosis without evidence of aneurysm. No pathologic lymphadenopathy. Reproductive: Normal sized prostate gland containing metallic fiducial markers. Atrophic  seminal vesicles. Other: Small BILATERAL inguinal hernias containing fat. Musculoskeletal: Multilevel degenerative disc disease, spondylosis and facet degenerative changes throughout the lumbar spine with severe multifactorial spinal stenosis at L3-4 and moderate to severe multifactorial spinal stenosis at L4-5. No acute findings. IMPRESSION: CT Chest: 1. No evidence of metastatic disease in the chest. 2. Moderate-sized RIGHT pleural effusion and small LEFT pleural effusion. Passive atelectasis and/or pneumonia involving the RIGHT LOWER LOBE and mild passive atelectasis involving the LEFT LOWER LOBE. 3.  No acute cardiopulmonary disease otherwise. 4. Moderate cardiomegaly. Severe three-vessel coronary atherosclerosis. Severe aortic valvular calcification. CT Abdomen Pelvis: 1. Cecal mass which extends over an approximate 5 cm segment. 2. No evidence of metastatic disease in the abdomen or pelvis. 3. Query hepatic cirrhosis. 4. Cholelithiasis without evidence of acute cholecystitis. 5. Distal descending and sigmoid colon diverticulosis without evidence of acute diverticulitis. Aortic Atherosclerosis (ICD10-I70.0) and Emphysema (ICD10-J43.9). Electronically Signed   By: Evangeline Dakin M.D.   On: 09/02/2017 18:01   Ct Abdomen Pelvis W Contrast  Result Date: 09/02/2017 CLINICAL DATA:  84 year old with recent diagnosis of colon cancer in the cecum. Patient presented with anemia and had heme-positive stools. Initial staging CT. EXAM: CT CHEST, ABDOMEN, AND PELVIS WITH CONTRAST TECHNIQUE: Multidetector CT imaging of the chest, abdomen and pelvis was performed following the standard protocol during bolus administration of intravenous contrast. CONTRAST:  174mL ISOVUE-300 IOPAMIDOL INJECTION 61% IV. Oral contrast was also administered. COMPARISON:  No prior CT chest, abdomen or pelvis. RIGHT hip CT 03/23/2017 is correlated. FINDINGS: CT CHEST FINDINGS Cardiovascular: Prior CABG. The coronary graft arising from the  ANTERIOR aspect of the aorta is difficult to visualize. Heart moderately enlarged. No pericardial effusion. Severe aortic valvular calcification. Severe three-vessel coronary atherosclerosis. Severe atherosclerosis involving the thoracic aorta without evidence of aneurysm. Atherosclerosis involving the proximal great vessels. Central pulmonary arteries patent. Mediastinum/Nodes: No pathologically enlarged mediastinal, hilar or axillary lymph nodes. No mediastinal masses. Normal-appearing esophagus. Normal-appearing thyroid gland. Lungs/Pleura: Emphysematous changes throughout both lungs. Moderately large RIGHT pleural effusion and small LEFT pleural effusion. No enhancing pleural masses. Airspace consolidation with air bronchograms involving the RIGHT LOWER LOBE. Mild passive atelectasis involving the LEFT LOWER LOBE. No pulmonary parenchymal nodules or masses. Central airways patent with mild bronchial wall thickening. Mild LEFT LOWER LOBE bronchiectasis. Musculoskeletal: Degenerative changes throughout the thoracic spine. No acute findings. CT ABDOMEN PELVIS FINDINGS Hepatobiliary: Unopacified hepatic veins. Liver upper normal in size. Mildly irregular hepatic contour. No convincing focal hepatic parenchymal masses. Numerous gallstones in the gallbladder, including cholesterol gallstones, the largest measuring approximately 2.6 cm. No pericholecystic edema or inflammation. Pancreas: Normal in appearance without evidence of mass, ductal dilation, or inflammation. Spleen: Normal in size and appearance. Focus of accessory splenic tissue MEDIAL to the spleen at its ANTERIOR tip. Adrenals/Urinary Tract: Normal appearing adrenal glands. Kidneys normal in size and appearance without focal parenchymal abnormality. No hydronephrosis. No evidence of urinary tract calculi. Normal appearing urinary bladder. Stomach/Bowel: Stomach decompressed and unremarkable. Normal-appearing small bowel. Circumferential mass involving the  cecum extending over an approximate 5 cm length, without evidence of obstruction. Entire colon  relatively decompressed. Distal descending and sigmoid colon diverticulosis without evidence of acute diverticulitis. Sigmoid colon tortuous and elongated. Normal appearing decompressed appendix in the RIGHT UPPER pelvis. Vascular/Lymphatic: Severe aortoiliofemoral and visceral artery atherosclerosis without evidence of aneurysm. No pathologic lymphadenopathy. Reproductive: Normal sized prostate gland containing metallic fiducial markers. Atrophic seminal vesicles. Other: Small BILATERAL inguinal hernias containing fat. Musculoskeletal: Multilevel degenerative disc disease, spondylosis and facet degenerative changes throughout the lumbar spine with severe multifactorial spinal stenosis at L3-4 and moderate to severe multifactorial spinal stenosis at L4-5. No acute findings. IMPRESSION: CT Chest: 1. No evidence of metastatic disease in the chest. 2. Moderate-sized RIGHT pleural effusion and small LEFT pleural effusion. Passive atelectasis and/or pneumonia involving the RIGHT LOWER LOBE and mild passive atelectasis involving the LEFT LOWER LOBE. 3.  No acute cardiopulmonary disease otherwise. 4. Moderate cardiomegaly. Severe three-vessel coronary atherosclerosis. Severe aortic valvular calcification. CT Abdomen Pelvis: 1. Cecal mass which extends over an approximate 5 cm segment. 2. No evidence of metastatic disease in the abdomen or pelvis. 3. Query hepatic cirrhosis. 4. Cholelithiasis without evidence of acute cholecystitis. 5. Distal descending and sigmoid colon diverticulosis without evidence of acute diverticulitis. Aortic Atherosclerosis (ICD10-I70.0) and Emphysema (ICD10-J43.9). Electronically Signed   By: Evangeline Dakin M.D.   On: 09/02/2017 18:01   Dg Chest Port 1 View  Result Date: 09/01/2017 CLINICAL DATA:  84 y.o. male with medical history significant for CAD, Afib not on anticoagulation due to history of  recurrent GI bleed felt to be from small gastric AVMs causing iron deficiency anemia. EXAM: PORTABLE CHEST 1 VIEW COMPARISON:  03/22/2017 FINDINGS: Median sternotomy. The heart is enlarged. There is perihilar airspace filling and prominent interstitial markings consistent with pulmonary edema. No focal consolidations. IMPRESSION: Cardiomegaly and mild pulmonary edema. Electronically Signed   By: Nolon Nations M.D.   On: 09/01/2017 16:18     Scheduled Meds: . allopurinol  100 mg Oral Daily  . buPROPion  150 mg Oral Daily  . cholecalciferol  1,000 Units Oral Daily  . citalopram  20 mg Oral Daily  . diclofenac sodium  4 g Topical QID  . diltiazem  120 mg Oral Daily  . feeding supplement  237 mL Oral BID BM  . furosemide  40 mg Oral Daily  . guaiFENesin  1,200 mg Oral BID  . insulin aspart  0-15 Units Subcutaneous TID WC  . insulin aspart  0-5 Units Subcutaneous QHS  . ipratropium  0.5 mg Nebulization BID  . iron polysaccharides  150 mg Oral BID  . levalbuterol  0.63 mg Nebulization BID  . pantoprazole  40 mg Oral BID  . potassium chloride SA  20 mEq Oral Daily  . pravastatin  20 mg Oral q1800  . senna-docusate  2 tablet Oral BID  . temazepam  15 mg Oral QHS   Continuous Infusions:   LOS: 4 days   Kerney Elbe, DO [facility] Pager [phone]  If 7PM-7AM, please contact night-coverage [email or web address] Password [email or web address] 09/03/2017, 8:02 AM

## 2017-09-03 NOTE — Progress Notes (Signed)
Biopsy of colonic mass near cecum from yesterday's colonoscopy came back positive for adenocarcinoma, as expected.  This finding was reviewed with the patient and his daughter at the bedside, along with the fact that his CT scan did not show any evidence of metastasis.  The patient and his daughter had questions about his ability to tolerate surgery.  I explained that a right hemicolectomy is a relatively easier colonic resection compared to other parts of the colon, and that he seemed to do very well with his propofol sedation during his colonoscopy so hopefully he will manage okay with his upcoming surgery.  We will sign off.  Please contact us if we can be of further assistance in this patient's care.  Cleotis Nipper, M.D. Pager [phone] If no answer or after 5 PM call [phone]

## 2017-09-03 NOTE — Progress Notes (Signed)
Pt sundowning and has become confused, irritable and verbally abusive. Threatening nurse and tech with foul language. Pt is upset that he cannot eat anything other than clear liquids.

## 2017-09-04 ENCOUNTER — Inpatient Hospital Stay (HOSPITAL_COMMUNITY): Payer: PPO

## 2017-09-04 DIAGNOSIS — I5031 Acute diastolic (congestive) heart failure: Secondary | ICD-10-CM

## 2017-09-04 LAB — CBC WITH DIFFERENTIAL/PLATELET
BASOS PCT: 1 %
Basophils Absolute: 0 10*3/uL (ref 0.0–0.1)
Eosinophils Absolute: 0.3 10*3/uL (ref 0.0–0.7)
Eosinophils Relative: 9 %
HCT: 27.9 % — ABNORMAL LOW (ref 39.0–52.0)
HEMOGLOBIN: 8.7 g/dL — AB (ref 13.0–17.0)
Lymphocytes Relative: 16 %
Lymphs Abs: 0.5 10*3/uL — ABNORMAL LOW (ref 0.7–4.0)
MCH: 29.4 pg (ref 26.0–34.0)
MCHC: 31.2 g/dL (ref 30.0–36.0)
MCV: 94.3 fL (ref 78.0–100.0)
Monocytes Absolute: 0.3 10*3/uL (ref 0.1–1.0)
Monocytes Relative: 8 %
Neutro Abs: 2.2 10*3/uL (ref 1.7–7.7)
Neutrophils Relative %: 66 %
Platelets: 176 10*3/uL (ref 150–400)
RBC: 2.96 MIL/uL — ABNORMAL LOW (ref 4.22–5.81)
RDW: 15.9 % — ABNORMAL HIGH (ref 11.5–15.5)
WBC: 3.3 10*3/uL — ABNORMAL LOW (ref 4.0–10.5)

## 2017-09-04 LAB — MAGNESIUM: Magnesium: 2.2 mg/dL (ref 1.7–2.4)

## 2017-09-04 LAB — COMPREHENSIVE METABOLIC PANEL
ALBUMIN: 3.5 g/dL (ref 3.5–5.0)
ALT: 10 U/L (ref 0–44)
AST: 15 U/L (ref 15–41)
Alkaline Phosphatase: 53 U/L (ref 38–126)
Anion gap: 9 (ref 5–15)
BILIRUBIN TOTAL: 0.5 mg/dL (ref 0.3–1.2)
BUN: 15 mg/dL (ref 8–23)
CALCIUM: 9 mg/dL (ref 8.9–10.3)
CO2: 29 mmol/L (ref 22–32)
Chloride: 103 mmol/L (ref 98–111)
Creatinine, Ser: 1.18 mg/dL (ref 0.61–1.24)
GFR calc Af Amer: >60 mL/min (ref >60)
GFR, EST NON AFRICAN AMERICAN: 56 mL/min — AB (ref >60)
Glucose, Bld: 121 mg/dL — ABNORMAL HIGH (ref 70–99)
Potassium: 3.5 mmol/L (ref 3.5–5.1)
Sodium: 141 mmol/L (ref 135–145)
TOTAL PROTEIN: 6.5 g/dL (ref 6.5–8.1)

## 2017-09-04 LAB — GLUCOSE, CAPILLARY
Glucose-Capillary: 123 mg/dL — ABNORMAL HIGH (ref 70–99)
Glucose-Capillary: 198 mg/dL — ABNORMAL HIGH (ref 70–99)
Glucose-Capillary: 142 mg/dL — ABNORMAL HIGH (ref 70–99)
Glucose-Capillary: 166 mg/dL — ABNORMAL HIGH (ref 70–99)

## 2017-09-04 LAB — PHOSPHORUS: Phosphorus: 4.5 mg/dL (ref 2.5–4.6)

## 2017-09-04 LAB — PH, BODY FLUID: pH, Body Fluid: 7.5

## 2017-09-04 NOTE — Progress Notes (Signed)
Subjective:  Denies SSCP, palpitations or Dyspnea   Objective:  Vitals:   09/03/17 1637 09/03/17 2118 09/03/17 2159 09/04/17 0503  BP:   (!) 129/95 120/67  Pulse:   78 65  Resp:   18 16  Temp:   98.2 F (36.8 C) 97.7 F (36.5 C)  TempSrc:   Oral Oral  SpO2: 96% 98% 97% 99%  Weight:      Height:        Intake/Output from previous day:  Intake/Output Summary (Last 24 hours) at 09/04/2017 0756 Last data filed at 09/03/2017 1820 Gross per 24 hour  Intake 240 ml  Output 575 ml  Net -335 ml    Physical Exam: Affect appropriate Chronically ill pale white male HEENT: normal Neck supple with no adenopathy JVP normal no bruits no thyromegaly Lungs clear with no wheezing and good diaphragmatic motion Heart:  S1/S2 AS murmur, no rub, gallop or click PMI normal Abdomen: benighn, BS positve, no tenderness, no AAA no bruit.  No HSM or HJR Distal pulses intact with no bruits No edema Neuro non-focal Skin warm and dry No muscular weakness   Lab Results: Basic Metabolic Panel: Recent Labs    09/03/17 0102 09/04/17 0511  NA 138 141  K 4.2 3.5  CL 103 103  CO2 26 29  GLUCOSE 131* 121*  BUN 19 15  CREATININE 1.15 1.18  CALCIUM 9.1 9.0  MG 2.2 2.2  PHOS 4.0 4.5   Liver Function Tests: Recent Labs    09/03/17 0102 09/04/17 0511  AST 16 15  ALT 10 10  ALKPHOS 55 53  BILITOT 0.6 0.5  PROT 6.9 6.5  ALBUMIN 3.7 3.5   No results for input(s): LIPASE, AMYLASE in the last 72 hours. CBC: Recent Labs    09/03/17 0102 09/04/17 0511  WBC 4.1 3.3*  NEUTROABS 2.9 2.2  HGB 8.8* 8.7*  HCT 28.0* 27.9*  MCV 94.6 94.3  PLT 180 176    Imaging: Dg Chest 1 View  Result Date: 09/03/2017 CLINICAL DATA:  Status post right-sided thoracentesis EXAM: CHEST  1 VIEW COMPARISON:  09/01/2017 FINDINGS: Cardiac shadow is enlarged. Postsurgical changes are noted. No post thoracentesis pneumothorax is noted. Minimal residual right effusion is seen. Mild atelectatic changes are  again noted in the left base. IMPRESSION: No pneumothorax following right thoracentesis. Electronically Signed   By: Inez Catalina M.D.   On: 09/03/2017 15:47   Ct Chest W Contrast  Result Date: 09/02/2017 CLINICAL DATA:  84 year old with recent diagnosis of colon cancer in the cecum. Patient presented with anemia and had heme-positive stools. Initial staging CT. EXAM: CT CHEST, ABDOMEN, AND PELVIS WITH CONTRAST TECHNIQUE: Multidetector CT imaging of the chest, abdomen and pelvis was performed following the standard protocol during bolus administration of intravenous contrast. CONTRAST:  123mL ISOVUE-300 IOPAMIDOL INJECTION 61% IV. Oral contrast was also administered. COMPARISON:  No prior CT chest, abdomen or pelvis. RIGHT hip CT 03/23/2017 is correlated. FINDINGS: CT CHEST FINDINGS Cardiovascular: Prior CABG. The coronary graft arising from the ANTERIOR aspect of the aorta is difficult to visualize. Heart moderately enlarged. No pericardial effusion. Severe aortic valvular calcification. Severe three-vessel coronary atherosclerosis. Severe atherosclerosis involving the thoracic aorta without evidence of aneurysm. Atherosclerosis involving the proximal great vessels. Central pulmonary arteries patent. Mediastinum/Nodes: No pathologically enlarged mediastinal, hilar or axillary lymph nodes. No mediastinal masses. Normal-appearing esophagus. Normal-appearing thyroid gland. Lungs/Pleura: Emphysematous changes throughout both lungs. Moderately large RIGHT pleural effusion and small LEFT pleural effusion. No enhancing pleural  masses. Airspace consolidation with air bronchograms involving the RIGHT LOWER LOBE. Mild passive atelectasis involving the LEFT LOWER LOBE. No pulmonary parenchymal nodules or masses. Central airways patent with mild bronchial wall thickening. Mild LEFT LOWER LOBE bronchiectasis. Musculoskeletal: Degenerative changes throughout the thoracic spine. No acute findings. CT ABDOMEN PELVIS FINDINGS  Hepatobiliary: Unopacified hepatic veins. Liver upper normal in size. Mildly irregular hepatic contour. No convincing focal hepatic parenchymal masses. Numerous gallstones in the gallbladder, including cholesterol gallstones, the largest measuring approximately 2.6 cm. No pericholecystic edema or inflammation. Pancreas: Normal in appearance without evidence of mass, ductal dilation, or inflammation. Spleen: Normal in size and appearance. Focus of accessory splenic tissue MEDIAL to the spleen at its ANTERIOR tip. Adrenals/Urinary Tract: Normal appearing adrenal glands. Kidneys normal in size and appearance without focal parenchymal abnormality. No hydronephrosis. No evidence of urinary tract calculi. Normal appearing urinary bladder. Stomach/Bowel: Stomach decompressed and unremarkable. Normal-appearing small bowel. Circumferential mass involving the cecum extending over an approximate 5 cm length, without evidence of obstruction. Entire colon relatively decompressed. Distal descending and sigmoid colon diverticulosis without evidence of acute diverticulitis. Sigmoid colon tortuous and elongated. Normal appearing decompressed appendix in the RIGHT UPPER pelvis. Vascular/Lymphatic: Severe aortoiliofemoral and visceral artery atherosclerosis without evidence of aneurysm. No pathologic lymphadenopathy. Reproductive: Normal sized prostate gland containing metallic fiducial markers. Atrophic seminal vesicles. Other: Small BILATERAL inguinal hernias containing fat. Musculoskeletal: Multilevel degenerative disc disease, spondylosis and facet degenerative changes throughout the lumbar spine with severe multifactorial spinal stenosis at L3-4 and moderate to severe multifactorial spinal stenosis at L4-5. No acute findings. IMPRESSION: CT Chest: 1. No evidence of metastatic disease in the chest. 2. Moderate-sized RIGHT pleural effusion and small LEFT pleural effusion. Passive atelectasis and/or pneumonia involving the RIGHT  LOWER LOBE and mild passive atelectasis involving the LEFT LOWER LOBE. 3.  No acute cardiopulmonary disease otherwise. 4. Moderate cardiomegaly. Severe three-vessel coronary atherosclerosis. Severe aortic valvular calcification. CT Abdomen Pelvis: 1. Cecal mass which extends over an approximate 5 cm segment. 2. No evidence of metastatic disease in the abdomen or pelvis. 3. Query hepatic cirrhosis. 4. Cholelithiasis without evidence of acute cholecystitis. 5. Distal descending and sigmoid colon diverticulosis without evidence of acute diverticulitis. Aortic Atherosclerosis (ICD10-I70.0) and Emphysema (ICD10-J43.9). Electronically Signed   By: Evangeline Dakin M.D.   On: 09/02/2017 18:01   Ct Abdomen Pelvis W Contrast  Result Date: 09/02/2017 CLINICAL DATA:  84 year old with recent diagnosis of colon cancer in the cecum. Patient presented with anemia and had heme-positive stools. Initial staging CT. EXAM: CT CHEST, ABDOMEN, AND PELVIS WITH CONTRAST TECHNIQUE: Multidetector CT imaging of the chest, abdomen and pelvis was performed following the standard protocol during bolus administration of intravenous contrast. CONTRAST:  135mL ISOVUE-300 IOPAMIDOL INJECTION 61% IV. Oral contrast was also administered. COMPARISON:  No prior CT chest, abdomen or pelvis. RIGHT hip CT 03/23/2017 is correlated. FINDINGS: CT CHEST FINDINGS Cardiovascular: Prior CABG. The coronary graft arising from the ANTERIOR aspect of the aorta is difficult to visualize. Heart moderately enlarged. No pericardial effusion. Severe aortic valvular calcification. Severe three-vessel coronary atherosclerosis. Severe atherosclerosis involving the thoracic aorta without evidence of aneurysm. Atherosclerosis involving the proximal great vessels. Central pulmonary arteries patent. Mediastinum/Nodes: No pathologically enlarged mediastinal, hilar or axillary lymph nodes. No mediastinal masses. Normal-appearing esophagus. Normal-appearing thyroid gland.  Lungs/Pleura: Emphysematous changes throughout both lungs. Moderately large RIGHT pleural effusion and small LEFT pleural effusion. No enhancing pleural masses. Airspace consolidation with air bronchograms involving the RIGHT LOWER LOBE. Mild passive atelectasis involving the LEFT  LOWER LOBE. No pulmonary parenchymal nodules or masses. Central airways patent with mild bronchial wall thickening. Mild LEFT LOWER LOBE bronchiectasis. Musculoskeletal: Degenerative changes throughout the thoracic spine. No acute findings. CT ABDOMEN PELVIS FINDINGS Hepatobiliary: Unopacified hepatic veins. Liver upper normal in size. Mildly irregular hepatic contour. No convincing focal hepatic parenchymal masses. Numerous gallstones in the gallbladder, including cholesterol gallstones, the largest measuring approximately 2.6 cm. No pericholecystic edema or inflammation. Pancreas: Normal in appearance without evidence of mass, ductal dilation, or inflammation. Spleen: Normal in size and appearance. Focus of accessory splenic tissue MEDIAL to the spleen at its ANTERIOR tip. Adrenals/Urinary Tract: Normal appearing adrenal glands. Kidneys normal in size and appearance without focal parenchymal abnormality. No hydronephrosis. No evidence of urinary tract calculi. Normal appearing urinary bladder. Stomach/Bowel: Stomach decompressed and unremarkable. Normal-appearing small bowel. Circumferential mass involving the cecum extending over an approximate 5 cm length, without evidence of obstruction. Entire colon relatively decompressed. Distal descending and sigmoid colon diverticulosis without evidence of acute diverticulitis. Sigmoid colon tortuous and elongated. Normal appearing decompressed appendix in the RIGHT UPPER pelvis. Vascular/Lymphatic: Severe aortoiliofemoral and visceral artery atherosclerosis without evidence of aneurysm. No pathologic lymphadenopathy. Reproductive: Normal sized prostate gland containing metallic fiducial markers.  Atrophic seminal vesicles. Other: Small BILATERAL inguinal hernias containing fat. Musculoskeletal: Multilevel degenerative disc disease, spondylosis and facet degenerative changes throughout the lumbar spine with severe multifactorial spinal stenosis at L3-4 and moderate to severe multifactorial spinal stenosis at L4-5. No acute findings. IMPRESSION: CT Chest: 1. No evidence of metastatic disease in the chest. 2. Moderate-sized RIGHT pleural effusion and small LEFT pleural effusion. Passive atelectasis and/or pneumonia involving the RIGHT LOWER LOBE and mild passive atelectasis involving the LEFT LOWER LOBE. 3.  No acute cardiopulmonary disease otherwise. 4. Moderate cardiomegaly. Severe three-vessel coronary atherosclerosis. Severe aortic valvular calcification. CT Abdomen Pelvis: 1. Cecal mass which extends over an approximate 5 cm segment. 2. No evidence of metastatic disease in the abdomen or pelvis. 3. Query hepatic cirrhosis. 4. Cholelithiasis without evidence of acute cholecystitis. 5. Distal descending and sigmoid colon diverticulosis without evidence of acute diverticulitis. Aortic Atherosclerosis (ICD10-I70.0) and Emphysema (ICD10-J43.9). Electronically Signed   By: Evangeline Dakin M.D.   On: 09/02/2017 18:01   US Thoracentesis Asp Pleural Space W/img Guide  Result Date: 09/03/2017 INDICATION: Patient with history of shortness of breath and right pleural effusion. Request is made for diagnostic and therapeutic right thoracentesis. EXAM: ULTRASOUND GUIDED DIAGNOSTIC AND THERAPEUTIC RIGHT THORACENTESIS MEDICATIONS: 10 mL of 1% lidocaine COMPLICATIONS: None immediate. PROCEDURE: An ultrasound guided thoracentesis was thoroughly discussed with the patient and questions answered. The benefits, risks, alternatives and complications were also discussed. The patient understands and wishes to proceed with the procedure. Written consent was obtained. Ultrasound was performed to localize and mark an adequate  pocket of fluid in the right chest. The area was then prepped and draped in the normal sterile fashion. 1% Lidocaine was used for local anesthesia. Under ultrasound guidance a 6 Fr Safe-T-Centesis catheter was introduced. Thoracentesis was performed. The catheter was removed and a dressing applied. FINDINGS: A total of approximately 650 mL of clear gold fluid was removed. Samples were sent to the laboratory as requested by the clinical team. IMPRESSION: Successful ultrasound guided right thoracentesis yielding 650 mL of pleural fluid. Read by: Earley Abide, PA-C Electronically Signed   By: Jacqulynn Cadet M.D.   On: 09/03/2017 15:35    Cardiac Studies:  ECG: Afib rate 106 nonspecific ST changes    Telemetry: afib   Echo:  EF 55-60% moderate AS mean gradient 20 mmHg mild MR   Medications:   . allopurinol  100 mg Oral Daily  . buPROPion  150 mg Oral Daily  . cholecalciferol  1,000 Units Oral Daily  . citalopram  20 mg Oral Daily  . diclofenac sodium  4 g Topical QID  . diltiazem  120 mg Oral Daily  . feeding supplement  237 mL Oral BID BM  . furosemide  80 mg Oral Daily  . guaiFENesin  1,200 mg Oral BID  . insulin aspart  0-15 Units Subcutaneous TID WC  . insulin aspart  0-5 Units Subcutaneous QHS  . ipratropium  0.5 mg Nebulization BID  . iron polysaccharides  150 mg Oral BID  . levalbuterol  0.63 mg Nebulization BID  . pantoprazole  40 mg Oral BID  . potassium chloride SA  20 mEq Oral Daily  . pravastatin  20 mg Oral q1800  . senna-docusate  2 tablet Oral BID  . temazepam  15 mg Oral QHS      Assessment/Plan:   Preoperative Cardiac Evaluation:  Clearly patient is high risk Little choice but to proceed with surgery Monday with anemia , potential for metastatic spread and obstruction. Looking back through chart no documented CAD with low risk myovue 2015. Afib is chronic and can be rate controlled. AS is moderate and not critical. Functional status is very poor and has oxygen  dependant COPD. Post op course and rehab will be long. Continue cardizem for rate control and diuretic to keep I/O's slightly negative Both will need to be changed to iv post op.    Jenkins Rouge 09/04/2017, 7:56 AM

## 2017-09-04 NOTE — Progress Notes (Signed)
PROGRESS NOTE    [name]  NTI:[id number] DOB: 10/11/1932 DOA: 08/30/2017 PCP: Wenda Low, MD   Brief Narrative: Joel [name] is a 84 y.o. male with medical history significant for CAD, Afib not on anticoagulation due to history of recurrent GI bleed felt to be from small gastric AVMs causing iron deficiency anemia. He required several transfusions last year, but has since been doing well. He has been following up with Hematology but has not followed up there since March 2019 it seems. The plan was for him to follow up monthlly with transfusions to keep Hb above 9. He has been doing well, but in the last 2-3 weeks he noted some abdominal bloating as well as weaness. Yesterday he want for a bone density scan and asked that his hemoglobin be checked, he was called to come to ER today due to Hb of 7. Denies nausea, abdominal pain, reflux, or BRBPR. He has very dark stools but he takes iron BID.  He has had some SOB with exertion, but no chest pain, dizziness or syncope.  In the ER repeat lab confirmed Hb 7, two units of blood have been ordered and Hemoccult was positive for blood. Hospitalist was asked to admit, and a call has been placed to GI for inpatient consultation. Patient underwent EGD which showed Gastritis, Duodenitis, and Esophageal Candidiasis. Repeat Hb improved to 9.0 but dropped again so GI did colonoscopy which showed a Cecal Mass so General Surgery was consulted.   Patient complained of Dyspnea so was given IV lasix on 09/01/17 and started on Breathing treatments with improvement ECHOCardiogram being checked. He appeared to be volume overloaded with a mild Diastolic CHF Exacerbation so lasix was increased. Cardiology was consulted for preoperative clearance. Anticipate Surgical Resection in AM so will make NPO except meds and sips after midnight.  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  Localized mild inflammation characterized by erosions and erythema was found in the duodenal bulb -Continue to Monitor for S/Sx of Bleeding -C/w Iron Polysaccharides 150 mg po BID -Protonix 40 mg po Daily increased to 40 mg po BID by Gastroenterology  -Will stop Monitoring Hb/Hct q8h -GI took the patient for colonoscopy this AM as his Hb/Hct was dropping and because he has not had a colonoscopy in 5 years. -Colonoscopy revealed an ulcerated, likely malignant non-obstructing medium-sized mass in the cecum and a 6 mm polyp in the rectum that was sessile along with sigmoid colon diverticula -GI feels cecal mass is strongly suggestive of cancer and likely accounts for chronic blood loss; Pathology Confirms Adenocarcinoma  -Continue to Monitor and repeat CBC in AM   DMType2 complicated by Diabetic Neuropathy in feet -HbA1c was 6.0  -Continue to Monitor CBG's -Hold Metformin 1000 mg po BID and Glimepiride 1 mg po Daily with Breakfast  -C/w Gabapentin  -Continued  Moderate Novolog SSI q4h  while NPO for possible GI Intervention but will change to Moderate AC/HS now that patient is on a Heart Healthy/Carb Modified Diet  -CBG's ranging from 123-198; Continue to Monitor CBG's closely   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 Continue po Lasix 80 mg  pHTN -Increased Lasix 40 mg Daily to 80 mg po Daily (Home Dose);  -Given IV Lasix yesterday -C/w Diuresis with Home Dose as he appears Euvolemic now   HTN -C/w Diltiazem 120 mg po Daily, Hold Losartan 25 mg po Daily for now currently that he got diuresed and will get IV Contrast    Chronic GIB -As Above; Likely from Cecal Mass   Persistent Atrial Fibrillation -s/p Multiple DCCV -C/w Diltiazem 120 mg po Daily -CHADS2VASc Elevated of 4 but not on Anticoagulation due chronic GIB -Per Cardiology should have ben on Baby ASA but stopped.  -Cardiology Consulted for Pre-Operative Clearance; recommending continuing Cardizem for Rate control and states will need to be changed to IV post-op  Aortic Stenosis  -Was Moderate on last ECHO at Cardiology office -Per Cardiology monitor clinically and follow with repeat [facility] though he is a poor candidate in the future even probably for TAVR -Repeat ECHO this Visit as below  -Cardiology Consulted for Pre-Op Clearance and patient is a High Risk   COPD on Home O2 -C/w 2 liters of O2 via [place]; Was not wearing it so desaturated -May have a mild Exacerbation because he felt dyspneic so will start patient on Xopenex/Atrovent q6h and place on Guaifenesin 1200 mg po BID  -C/w Dextromethorphan-Guaifenesin 30-600 mg po q12h  Bipolar Affective Disorder -C/w Citalopram but to 20 mg po Daily and Bupropion 150 mg po Daily  -C/w Temazepam  15 mg po qHS  OSA -Ordered CPAP -Was placed on BiPAP 16/12 with 2 Liters of O2 Humidified overnight   Esophageal Candidiasis -Noted on EGD with plaques consistent with Candidiasis -Started on Fluconazole 100 mg po TID x3 days per GI and is to stopped 09/02/17  HLD/Dyslipidemia  -C/w Lovastatin 20 mg po Daily qHS   Gout -C/w Home Allopurinol 100 mg po Daily   Recent Right Hip Fracture -PT/OT to Evaluate and Treat -X-Ray on 06/07/17 showed: "Standing AP pelvis and frog-leg lateral x-ray demonstrates cannulated screw fixation femoral neck fracture with slight backout of cannulated screws unchanged from March 2019. No evidence of AVN and no displacement with interval healing of the femoral neck fracture.  Impression: Satisfactory cannulated screw fixation femoral neck fracture  with good position." -Dr Lorin Mercy recommended Ambulation and LE Strengthening   CKD Stage 3 -Cr appears to be elevated since the beginning of the year -Maybe new Baseline; BUN/Cr now 15/1.18 -BUN/Cr stable compared to prior values this year -Avoid Nephrotoxic Medications if possible and monitor closely given that patient is to receive contrast dye for CT Scans  -Repeat CMP in AM   BPPV -C/w Home Meclizine 25 mg po TIDprn  Dyspnea, significantly  improved  -Likely Multifactorial and in the setting of COPD, Anemia and mild volume overload with Acute on Chronic Diastolic CHF, and Right Plueral Effusion -Bronchodilators as above -Check ECHOCardiogram and Chest X-Ray -ECHOCardiogram showed the estimated  ejection fraction was in the range of 55% to 60%. The study is not technically sufficient to allow evaluation of LV diastolic function. -CXR showed Cardiomegaly and mild pulmonary edema. -Lasix as above -Flutter Valve, Incentive Spirometry, and Guaifenesin -Obtain Right Thoracentesis   Acute on Chronic Diastolic CHF, mild Exacerbation but now improved -Had 1+ LE Edema and crackles -Increased po Lasix to 80 mg  Daily -Gave IV Lasix as well.  -Strict I's/O's, Daily Weights; Patient is -2.901 Liters  -Cardiology Consulted for Pre-Op Clearance and patient is high risk -Continue to Monitor Volume Status   DVT prophylaxis: SCDs given concern for Blood Loss Anemia Code Status: FULL CODE Family Communication: Discussed with Grandchildren at Bedside  Disposition Plan: Pending Further workup and evaluation by General Surgery  Consultants:   [facility] Gastroenterology (signed off)  General Surgery   Cardiology    Procedures:  EGD Findings:      Diffuse, white plaques were found in the mid esophagus.      Diffuse moderate inflammation characterized by erosions, erythema and       friability was found in the entire examined stomach.      Localized mild inflammation characterized by erosions and erythema was       found in the duodenal bulb. Impression:               - Esophageal plaques were found, consistent with                            candidiasis. Due to the patient's history of                            diabetes and the fact that he had been bleeding                            this was not biopsy                           - Gastritis.                           - Duodenitis.                           - No specimens collected.  ECHOCARDIOGRAM ------------------------------------------------------------------- Study Conclusions  - Left ventricle: Wall thickness was increased in a pattern of   moderate LVH. Systolic function was normal. The estimated   ejection fraction was in the range of 55% to 60%. The study is   not technically sufficient to allow evaluation of LV diastolic   function. - Aortic valve: There was moderate stenosis. Valve area (VTI): 1.57   cm^2. Valve area (Vmax): 1.74 cm^2. Valve area (Vmean): 1.53   cm^2. - Mitral valve: Moderately calcified annulus. Mildly thickened,   mildly calcified leaflets . There was mild regurgitation. Valve   area by continuity equation  (using LVOT flow): 2.59 cm^2. - Left atrium: The atrium was moderately dilated. - Atrial septum: No defect or patent foramen ovale was identified. - Pulmonary arteries:  PA peak pressure: 65 mm Hg (S). - Impressions: Gradients have increased since 2017 when they were   mean 16 mmHg and peak 26 mmHg.  Impressions:  - Gradients have increased since 2017 when they were mean 16 mmHg   and peak 26 mmHg.  COLONOSCOPY Findings:      The digital rectal exam findings include flat prostate bed.      An ulcerated non-obstructing medium-sized MASS was found in the cecum.       The mass was non-circumferential. No bleeding was present. This was       biopsied with a cold forceps for histology. Estimated blood loss was       minimal.      A 6 mm polyp was found in the rectum (benign-appearing lesion). The       polyp was sessile. The polyp was removed with a cold snare. Resection       and retrieval were complete. Estimated blood loss: 3 mL.      Multiple large-mouthed diverticula were found in the sigmoid colon.      The exam was otherwise normal throughout the examined colon.      The terminal ileum appeared normal.      The retroflexed view of the distal rectum and anal verge was normal and       showed no anal or rectal abnormalities. Impression:               - Flat prostate bed found on digital rectal exam.                           - LIKELY MALIGNANT MASS in the cecum. Biopsied.                            This could well account for patient's                            recurrent/ongoing low grade blood loss.                           - One benign appearing 6 mm polyp in the rectum,                            removed with a cold snare. Resected and retrieved.                           - Diverticulosis in the sigmoid colon.                           - The examined portion of the ileum was normal.                           - The distal rectum and anal verge are normal on                             retroflexion view.   Antimicrobials:  Anti-infectives (From admission, onward)   Start     Dose/Rate Route Frequency Ordered Stop   08/31/17 1100  fluconazole (DIFLUCAN) tablet 100 mg     100 mg  Oral Daily 08/31/17 0921 09/02/17 1726     Subjective: Seen and examined at bedside and states he was feeling well.  Nursing states that he sundown and became very agitated last night however he is very pleasant this morning.  States he slept very well last night.  No chest pain, shortness breath, nausea, vomiting.  Be on a clear liquid diet.  No other concerns or complaints at this time and hopeful for surgery in a.m.  Objective: Vitals:   09/04/17 0503 09/04/17 0800 09/04/17 0900 09/04/17 1414  BP: 120/67   (!) 129/59  Pulse: 65   63  Resp: 16   16  Temp: 97.7 F (36.5 C)   98.2 F (36.8 C)  TempSrc: Oral   Oral  SpO2: 99% 95% 97% 98%  Weight:      Height:        Intake/Output Summary (Last 24 hours) at 09/04/2017 1500 Last data filed at 09/04/2017 0900 Gross per 24 hour  Intake -  Output 1000 ml  Net -1000 ml   Filed Weights   08/30/17 1720 08/30/17 1724 08/30/17 2240  Weight: 108.9 kg (240 lb) 108.9 kg (240 lb) 108.9 kg (240 lb)   Examination: Physical Exam:  Constitutional: Well-nourished, well-developed obese Caucasian male currently and sitting in chair bedside talking with his grandchildren in no acute distress Eyes: Sclera anicteric.  Lids and conjunctive are normal. ENMT: External ears and nose appear normal Neck: Supple with no appreciable JVD Respiratory: Diminished to auscultation bilaterally with no appreciable crackles today.  Does have some coarse breath sounds but is not wheezing or have any rhonchi. Unlabored breathing and not tachypneic. Not wearing any supplemental O2 via [place] Cardiovascular: Irregularly irregular.  Has a loud 3 out of 6 systolic murmur appreciated.  Has trace lower extremity edema today and improved from yesterday Abdomen: Soft,  nontender, slightly distended secondary body habitus.  Bowel sounds present all 4 quadrants GU: Deferred Musculoskeletal: No contractures cyanosis.  No joint deformity noted Skin: Skin is warm and dry no appreciable rash or lesions limited skin evaluation Neurologic: Renal nerves II through XII grossly intact no appreciable focal deficits Psychiatric: Normal pleasant mood and affect.  Intact judgment and insight.  Patient awake and alert and oriented x3  Data Reviewed: I have personally reviewed following labs and imaging studies  CBC: Recent Labs  Lab 08/31/17 0647  09/01/17 0037  09/02/17 0054 09/02/17 0804 09/02/17 1810 09/03/17 0102 09/04/17 0511  WBC 5.6  --  6.6  --  4.5  --   --  4.1 3.3*  NEUTROABS  --   --  5.2  --  3.2  --   --  2.9 2.2  HGB 8.9*   < > 8.4*   < > 8.9* 8.7* 9.0* 8.8* 8.7*  HCT 27.9*   < > 27.2*   < > 28.4* 27.7* 29.1* 28.0* 27.9*  MCV 94.3  --  94.1  --  95.0  --   --  94.6 94.3  PLT 168  --  177  --  188  --   --  180 176   < > = values in this interval not displayed.   Basic Metabolic Panel: Recent Labs  Lab 08/31/17 0647 09/01/17 0037 09/02/17 0054 09/03/17 0102 09/04/17 0511  NA 142 142 145 138 141  K 4.5 4.3 4.2 4.2 3.5  CL 107 107 107 103 103  CO2 25 26 30 26 29   GLUCOSE 141* 174* 140* 131* 121*  BUN  25* 24* 21 19 15   CREATININE 1.40* 1.31* 1.18 1.15 1.18  CALCIUM 9.4 8.8* 9.2 9.1 9.0  MG  --  2.4 2.3 2.2 2.2  PHOS  --  4.2 3.9 4.0 4.5   GFR: Estimated Creatinine Clearance: 61.5 mL/min (by C-G formula based on SCr of 1.18 mg/dL). Liver Function Tests: Recent Labs  Lab 09/01/17 0037 09/02/17 0054 09/03/17 0102 09/04/17 0511  AST 15 13* 16 15  ALT 12 12 10 10   ALKPHOS 60 59 55 53  BILITOT 0.8 0.9 0.6 0.5  PROT 6.8 7.1 6.9 6.5  ALBUMIN 3.6 3.6 3.7 3.5   No results for input(s): LIPASE, AMYLASE in the last 168 hours. No results for input(s): AMMONIA in the last 168 hours. Coagulation Profile: Recent Labs  Lab 08/30/17 2135   INR 1.07   Cardiac Enzymes: No results for input(s): CKTOTAL, CKMB, CKMBINDEX, TROPONINI in the last 168 hours. BNP (last 3 results) No results for input(s): PROBNP in the last 8760 hours. HbA1C: No results for input(s): HGBA1C in the last 72 hours. CBG: Recent Labs  Lab 09/03/17 1149 09/03/17 1626 09/03/17 2141 09/04/17 0739 09/04/17 1202  GLUCAP 165* 142* 159* 123* 198*   Lipid Profile: No results for input(s): CHOL, HDL, LDLCALC, TRIG, CHOLHDL, LDLDIRECT in the last 72 hours. Thyroid Function Tests: No results for input(s): TSH, T4TOTAL, FREET4, T3FREE, THYROIDAB in the last 72 hours. Anemia Panel: No results for input(s): VITAMINB12, FOLATE, FERRITIN, TIBC, IRON, RETICCTPCT in the last 72 hours. Sepsis Labs: No results for input(s): PROCALCITON, LATICACIDVEN in the last 168 hours.  Recent Results (from the past 240 hour(s))  Body fluid culture     Status: None (Preliminary result)   Collection Time: 09/03/17  3:54 PM  Result Value Ref Range Status   Specimen Description   Final    PLEURAL RIGHT Performed at [facility] [address], [place], [place] [address]    Special Requests   Final    NONE Performed at [facility], [place] [address] [place], [place] [address]    Gram Stain   Final    WBC PRESENT,BOTH PMN AND MONONUCLEAR NO ORGANISMS SEEN CYTOSPIN SMEAR    Culture   Final    NO GROWTH < 24 HOURS Performed at [facility], [place] [address] [place], [place] [address]    Report Status PENDING  Incomplete     Radiology Studies: Dg Chest 1 View  Result Date: 09/03/2017 CLINICAL DATA:  Status post right-sided thoracentesis EXAM: CHEST  1 VIEW COMPARISON:  09/01/2017 FINDINGS: Cardiac shadow is enlarged. Postsurgical changes are noted. No post thoracentesis pneumothorax is noted. Minimal residual right effusion is seen. Mild atelectatic changes are again noted in the left base. IMPRESSION: No pneumothorax following  right thoracentesis. Electronically Signed   By: Inez Catalina M.D.   On: 09/03/2017 15:47   Dg Chest 2 View  Result Date: 09/04/2017 CLINICAL DATA:  Per order- SOB Hx of cancer, HTN, DM, CHF, AFIB, and COPD. EXAM: CHEST - 2 VIEW COMPARISON:  09/13/2017 FINDINGS: Median sternotomy. The heart is enlarged, stable in configuration. There is pulmonary vascular congestion and mild prominence of interstitial markings, consistent with mild edema. More focal opacity in the RIGHT mid lung zone raises question of early infiltrate. No pleural effusions. Degenerative changes are seen in thoracic spine. IMPRESSION: Cardiomegaly and mild edema. Question of early infiltrate in the RIGHT UPPER lobe. Followup PA and lateral chest X-ray is recommended in 3-4 weeks following  trial of antibiotic therapy to ensure resolution and exclude underlying malignancy. Electronically Signed   By: Nolon Nations M.D.   On: 09/04/2017 11:23   Ct Chest W Contrast  Result Date: 09/02/2017 CLINICAL DATA:  84 year old with recent diagnosis of colon cancer in the cecum. Patient presented with anemia and had heme-positive stools. Initial staging CT. EXAM: CT CHEST, ABDOMEN, AND PELVIS WITH CONTRAST TECHNIQUE: Multidetector CT imaging of the chest, abdomen and pelvis was performed following the standard protocol during bolus administration of intravenous contrast. CONTRAST:  143mL ISOVUE-300 IOPAMIDOL INJECTION 61% IV. Oral contrast was also administered. COMPARISON:  No prior CT chest, abdomen or pelvis. RIGHT hip CT 03/23/2017 is correlated. FINDINGS: CT CHEST FINDINGS Cardiovascular: Prior CABG. The coronary graft arising from the ANTERIOR aspect of the aorta is difficult to visualize. Heart moderately enlarged. No pericardial effusion. Severe aortic valvular calcification. Severe three-vessel coronary atherosclerosis. Severe atherosclerosis involving the thoracic aorta without evidence of aneurysm. Atherosclerosis involving the proximal great  vessels. Central pulmonary arteries patent. Mediastinum/Nodes: No pathologically enlarged mediastinal, hilar or axillary lymph nodes. No mediastinal masses. Normal-appearing esophagus. Normal-appearing thyroid gland. Lungs/Pleura: Emphysematous changes throughout both lungs. Moderately large RIGHT pleural effusion and small LEFT pleural effusion. No enhancing pleural masses. Airspace consolidation with air bronchograms involving the RIGHT LOWER LOBE. Mild passive atelectasis involving the LEFT LOWER LOBE. No pulmonary parenchymal nodules or masses. Central airways patent with mild bronchial wall thickening. Mild LEFT LOWER LOBE bronchiectasis. Musculoskeletal: Degenerative changes throughout the thoracic spine. No acute findings. CT ABDOMEN PELVIS FINDINGS Hepatobiliary: Unopacified hepatic veins. Liver upper normal in size. Mildly irregular hepatic contour. No convincing focal hepatic parenchymal masses. Numerous gallstones in the gallbladder, including cholesterol gallstones, the largest measuring approximately 2.6 cm. No pericholecystic edema or inflammation. Pancreas: Normal in appearance without evidence of mass, ductal dilation, or inflammation. Spleen: Normal in size and appearance. Focus of accessory splenic tissue MEDIAL to the spleen at its ANTERIOR tip. Adrenals/Urinary Tract: Normal appearing adrenal glands. Kidneys normal in size and appearance without focal parenchymal abnormality. No hydronephrosis. No evidence of urinary tract calculi. Normal appearing urinary bladder. Stomach/Bowel: Stomach decompressed and unremarkable. Normal-appearing small bowel. Circumferential mass involving the cecum extending over an approximate 5 cm length, without evidence of obstruction. Entire colon relatively decompressed. Distal descending and sigmoid colon diverticulosis without evidence of acute diverticulitis. Sigmoid colon tortuous and elongated. Normal appearing decompressed appendix in the RIGHT UPPER pelvis.  Vascular/Lymphatic: Severe aortoiliofemoral and visceral artery atherosclerosis without evidence of aneurysm. No pathologic lymphadenopathy. Reproductive: Normal sized prostate gland containing metallic fiducial markers. Atrophic seminal vesicles. Other: Small BILATERAL inguinal hernias containing fat. Musculoskeletal: Multilevel degenerative disc disease, spondylosis and facet degenerative changes throughout the lumbar spine with severe multifactorial spinal stenosis at L3-4 and moderate to severe multifactorial spinal stenosis at L4-5. No acute findings. IMPRESSION: CT Chest: 1. No evidence of metastatic disease in the chest. 2. Moderate-sized RIGHT pleural effusion and small LEFT pleural effusion. Passive atelectasis and/or pneumonia involving the RIGHT LOWER LOBE and mild passive atelectasis involving the LEFT LOWER LOBE. 3.  No acute cardiopulmonary disease otherwise. 4. Moderate cardiomegaly. Severe three-vessel coronary atherosclerosis. Severe aortic valvular calcification. CT Abdomen Pelvis: 1. Cecal mass which extends over an approximate 5 cm segment. 2. No evidence of metastatic disease in the abdomen or pelvis. 3. Query hepatic cirrhosis. 4. Cholelithiasis without evidence of acute cholecystitis. 5. Distal descending and sigmoid colon diverticulosis without evidence of acute diverticulitis. Aortic Atherosclerosis (ICD10-I70.0) and Emphysema (ICD10-J43.9). Electronically Signed   By: Evangeline Dakin  M.D.   On: 09/02/2017 18:01   Ct Abdomen Pelvis W Contrast  Result Date: 09/02/2017 CLINICAL DATA:  84 year old with recent diagnosis of colon cancer in the cecum. Patient presented with anemia and had heme-positive stools. Initial staging CT. EXAM: CT CHEST, ABDOMEN, AND PELVIS WITH CONTRAST TECHNIQUE: Multidetector CT imaging of the chest, abdomen and pelvis was performed following the standard protocol during bolus administration of intravenous contrast. CONTRAST:  160mL ISOVUE-300 IOPAMIDOL INJECTION  61% IV. Oral contrast was also administered. COMPARISON:  No prior CT chest, abdomen or pelvis. RIGHT hip CT 03/23/2017 is correlated. FINDINGS: CT CHEST FINDINGS Cardiovascular: Prior CABG. The coronary graft arising from the ANTERIOR aspect of the aorta is difficult to visualize. Heart moderately enlarged. No pericardial effusion. Severe aortic valvular calcification. Severe three-vessel coronary atherosclerosis. Severe atherosclerosis involving the thoracic aorta without evidence of aneurysm. Atherosclerosis involving the proximal great vessels. Central pulmonary arteries patent. Mediastinum/Nodes: No pathologically enlarged mediastinal, hilar or axillary lymph nodes. No mediastinal masses. Normal-appearing esophagus. Normal-appearing thyroid gland. Lungs/Pleura: Emphysematous changes throughout both lungs. Moderately large RIGHT pleural effusion and small LEFT pleural effusion. No enhancing pleural masses. Airspace consolidation with air bronchograms involving the RIGHT LOWER LOBE. Mild passive atelectasis involving the LEFT LOWER LOBE. No pulmonary parenchymal nodules or masses. Central airways patent with mild bronchial wall thickening. Mild LEFT LOWER LOBE bronchiectasis. Musculoskeletal: Degenerative changes throughout the thoracic spine. No acute findings. CT ABDOMEN PELVIS FINDINGS Hepatobiliary: Unopacified hepatic veins. Liver upper normal in size. Mildly irregular hepatic contour. No convincing focal hepatic parenchymal masses. Numerous gallstones in the gallbladder, including cholesterol gallstones, the largest measuring approximately 2.6 cm. No pericholecystic edema or inflammation. Pancreas: Normal in appearance without evidence of mass, ductal dilation, or inflammation. Spleen: Normal in size and appearance. Focus of accessory splenic tissue MEDIAL to the spleen at its ANTERIOR tip. Adrenals/Urinary Tract: Normal appearing adrenal glands. Kidneys normal in size and appearance without focal  parenchymal abnormality. No hydronephrosis. No evidence of urinary tract calculi. Normal appearing urinary bladder. Stomach/Bowel: Stomach decompressed and unremarkable. Normal-appearing small bowel. Circumferential mass involving the cecum extending over an approximate 5 cm length, without evidence of obstruction. Entire colon relatively decompressed. Distal descending and sigmoid colon diverticulosis without evidence of acute diverticulitis. Sigmoid colon tortuous and elongated. Normal appearing decompressed appendix in the RIGHT UPPER pelvis. Vascular/Lymphatic: Severe aortoiliofemoral and visceral artery atherosclerosis without evidence of aneurysm. No pathologic lymphadenopathy. Reproductive: Normal sized prostate gland containing metallic fiducial markers. Atrophic seminal vesicles. Other: Small BILATERAL inguinal hernias containing fat. Musculoskeletal: Multilevel degenerative disc disease, spondylosis and facet degenerative changes throughout the lumbar spine with severe multifactorial spinal stenosis at L3-4 and moderate to severe multifactorial spinal stenosis at L4-5. No acute findings. IMPRESSION: CT Chest: 1. No evidence of metastatic disease in the chest. 2. Moderate-sized RIGHT pleural effusion and small LEFT pleural effusion. Passive atelectasis and/or pneumonia involving the RIGHT LOWER LOBE and mild passive atelectasis involving the LEFT LOWER LOBE. 3.  No acute cardiopulmonary disease otherwise. 4. Moderate cardiomegaly. Severe three-vessel coronary atherosclerosis. Severe aortic valvular calcification. CT Abdomen Pelvis: 1. Cecal mass which extends over an approximate 5 cm segment. 2. No evidence of metastatic disease in the abdomen or pelvis. 3. Query hepatic cirrhosis. 4. Cholelithiasis without evidence of acute cholecystitis. 5. Distal descending and sigmoid colon diverticulosis without evidence of acute diverticulitis. Aortic Atherosclerosis (ICD10-I70.0) and Emphysema (ICD10-J43.9).  Electronically Signed   By: Evangeline Dakin M.D.   On: 09/02/2017 18:01   US Thoracentesis Asp Pleural Space W/img Guide  Result  Date: 09/03/2017 INDICATION: Patient with history of shortness of breath and right pleural effusion. Request is made for diagnostic and therapeutic right thoracentesis. EXAM: ULTRASOUND GUIDED DIAGNOSTIC AND THERAPEUTIC RIGHT THORACENTESIS MEDICATIONS: 10 mL of 1% lidocaine COMPLICATIONS: None immediate. PROCEDURE: An ultrasound guided thoracentesis was thoroughly discussed with the patient and questions answered. The benefits, risks, alternatives and complications were also discussed. The patient understands and wishes to proceed with the procedure. Written consent was obtained. Ultrasound was performed to localize and mark an adequate pocket of fluid in the right chest. The area was then prepped and draped in the normal sterile fashion. 1% Lidocaine was used for local anesthesia. Under ultrasound guidance a 6 Fr Safe-T-Centesis catheter was introduced. Thoracentesis was performed. The catheter was removed and a dressing applied. FINDINGS: A total of approximately 650 mL of clear gold fluid was removed. Samples were sent to the laboratory as requested by the clinical team. IMPRESSION: Successful ultrasound guided right thoracentesis yielding 650 mL of pleural fluid. Read by: Earley Abide, PA-C Electronically Signed   By: Jacqulynn Cadet M.D.   On: 09/03/2017 15:35     Scheduled Meds: . allopurinol  100 mg Oral Daily  . buPROPion  150 mg Oral Daily  . cholecalciferol  1,000 Units Oral Daily  . citalopram  20 mg Oral Daily  . diclofenac sodium  4 g Topical QID  . diltiazem  120 mg Oral Daily  . feeding supplement  237 mL Oral BID BM  . furosemide  80 mg Oral Daily  . guaiFENesin  1,200 mg Oral BID  . insulin aspart  0-15 Units Subcutaneous TID WC  . insulin aspart  0-5 Units Subcutaneous QHS  . ipratropium  0.5 mg Nebulization BID  . iron polysaccharides  150 mg Oral  BID  . levalbuterol  0.63 mg Nebulization BID  . pantoprazole  40 mg Oral BID  . potassium chloride SA  20 mEq Oral Daily  . pravastatin  20 mg Oral q1800  . senna-docusate  2 tablet Oral BID  . temazepam  15 mg Oral QHS   Continuous Infusions:   LOS: 5 days   Kerney Elbe, DO [facility] Pager [phone]  If 7PM-7AM, please contact night-coverage [email or web address] Password [email or web address] 09/04/2017, 3:00 PM

## 2017-09-05 ENCOUNTER — Encounter (HOSPITAL_COMMUNITY): Payer: Self-pay | Admitting: Anesthesiology

## 2017-09-05 ENCOUNTER — Inpatient Hospital Stay (HOSPITAL_COMMUNITY): Payer: PPO | Admitting: Registered Nurse

## 2017-09-05 ENCOUNTER — Encounter (HOSPITAL_COMMUNITY)
Admission: EM | Disposition: A | Discharge status: Home Health | Payer: Self-pay | Source: Home / Self Care | Attending: Internal Medicine

## 2017-09-05 DIAGNOSIS — I10 Essential (primary) hypertension: Secondary | ICD-10-CM

## 2017-09-05 DIAGNOSIS — C182 Malignant neoplasm of ascending colon: Principal | ICD-10-CM

## 2017-09-05 DIAGNOSIS — Z9049 Acquired absence of other specified parts of digestive tract: Secondary | ICD-10-CM

## 2017-09-05 HISTORY — DX: Malignant neoplasm of ascending colon: C18.2

## 2017-09-05 HISTORY — PX: COLON RESECTION: SHX5231

## 2017-09-05 LAB — CBC WITH DIFFERENTIAL/PLATELET
BASOS PCT: 1 %
Basophils Absolute: 0 10*3/uL (ref 0.0–0.1)
EOS ABS: 0.3 10*3/uL (ref 0.0–0.7)
Eosinophils Relative: 8 %
HCT: 27.7 % — ABNORMAL LOW (ref 39.0–52.0)
HEMOGLOBIN: 8.7 g/dL — AB (ref 13.0–17.0)
Lymphocytes Relative: 11 %
Lymphs Abs: 0.4 10*3/uL — ABNORMAL LOW (ref 0.7–4.0)
MCH: 29.4 pg (ref 26.0–34.0)
MCHC: 31.4 g/dL (ref 30.0–36.0)
MCV: 93.6 fL (ref 78.0–100.0)
Monocytes Absolute: 0.4 10*3/uL (ref 0.1–1.0)
Monocytes Relative: 10 %
NEUTROS PCT: 70 %
Neutro Abs: 2.6 10*3/uL (ref 1.7–7.7)
PLATELETS: 173 10*3/uL (ref 150–400)
RBC: 2.96 MIL/uL — AB (ref 4.22–5.81)
RDW: 15.7 % — ABNORMAL HIGH (ref 11.5–15.5)
WBC: 3.6 10*3/uL — AB (ref 4.0–10.5)

## 2017-09-05 LAB — TYPE AND SCREEN
ABO/RH(D): O POS
Antibody Screen: NEGATIVE

## 2017-09-05 LAB — CBC
HEMATOCRIT: 28.5 % — AB (ref 39.0–52.0)
HEMATOCRIT: 28.7 % — AB (ref 39.0–52.0)
Hemoglobin: 9 g/dL — ABNORMAL LOW (ref 13.0–17.0)
Hemoglobin: 9 g/dL — ABNORMAL LOW (ref 13.0–17.0)
MCH: 29.6 pg (ref 26.0–34.0)
MCH: 29.5 pg (ref 26.0–34.0)
MCHC: 31.6 g/dL (ref 30.0–36.0)
MCHC: 31.4 g/dL (ref 30.0–36.0)
MCV: 93.8 fL (ref 78.0–100.0)
MCV: 94.1 fL (ref 78.0–100.0)
Platelets: 191 10*3/uL (ref 150–400)
Platelets: 194 10*3/uL (ref 150–400)
RBC: 3.04 MIL/uL — AB (ref 4.22–5.81)
RBC: 3.05 MIL/uL — ABNORMAL LOW (ref 4.22–5.81)
RDW: 15.7 % — ABNORMAL HIGH (ref 11.5–15.5)
RDW: 15.7 % — AB (ref 11.5–15.5)
WBC: 5.8 10*3/uL (ref 4.0–10.5)
WBC: 6.2 10*3/uL (ref 4.0–10.5)

## 2017-09-05 LAB — SURGICAL PCR SCREEN
MRSA, PCR: NEGATIVE
Staphylococcus aureus: NEGATIVE

## 2017-09-05 LAB — HEMOGLOBIN A1C
Hgb A1c MFr Bld: 6.1 % — ABNORMAL HIGH (ref 4.8–5.6)
Mean Plasma Glucose: 128.37 mg/dL

## 2017-09-05 LAB — BASIC METABOLIC PANEL
Anion gap: 9 (ref 5–15)
BUN: 15 mg/dL (ref 8–23)
CALCIUM: 8.9 mg/dL (ref 8.9–10.3)
CO2: 26 mmol/L (ref 22–32)
CREATININE: 1.16 mg/dL (ref 0.61–1.24)
Chloride: 103 mmol/L (ref 98–111)
GFR calc non Af Amer: 57 mL/min — ABNORMAL LOW (ref >60)
Glucose, Bld: 196 mg/dL — ABNORMAL HIGH (ref 70–99)
Potassium: 4.4 mmol/L (ref 3.5–5.1)
Sodium: 138 mmol/L (ref 135–145)

## 2017-09-05 LAB — GLUCOSE, CAPILLARY
GLUCOSE-CAPILLARY: 192 mg/dL — AB (ref 70–99)
GLUCOSE-CAPILLARY: 169 mg/dL — AB (ref 70–99)
Glucose-Capillary: 130 mg/dL — ABNORMAL HIGH (ref 70–99)
Glucose-Capillary: 157 mg/dL — ABNORMAL HIGH (ref 70–99)

## 2017-09-05 LAB — COMPREHENSIVE METABOLIC PANEL
ALBUMIN: 3.6 g/dL (ref 3.5–5.0)
ALK PHOS: 57 U/L (ref 38–126)
ALT: 10 U/L (ref 0–44)
ANION GAP: 8 (ref 5–15)
AST: 22 U/L (ref 15–41)
BUN: 15 mg/dL (ref 8–23)
CALCIUM: 9.2 mg/dL (ref 8.9–10.3)
CHLORIDE: 103 mmol/L (ref 98–111)
CO2: 30 mmol/L (ref 22–32)
Creatinine, Ser: 1.25 mg/dL — ABNORMAL HIGH (ref 0.61–1.24)
GFR calc Af Amer: >60 mL/min (ref >60)
GFR calc non Af Amer: 52 mL/min — ABNORMAL LOW (ref >60)
GLUCOSE: 125 mg/dL — AB (ref 70–99)
Potassium: 4.1 mmol/L (ref 3.5–5.1)
SODIUM: 141 mmol/L (ref 135–145)
Total Bilirubin: 0.6 mg/dL (ref 0.3–1.2)
Total Protein: 6.7 g/dL (ref 6.5–8.1)

## 2017-09-05 LAB — CREATININE, SERUM
Creatinine, Ser: 1.32 mg/dL — ABNORMAL HIGH (ref 0.61–1.24)
GFR calc non Af Amer: 49 mL/min — ABNORMAL LOW (ref >60)
GFR, EST AFRICAN AMERICAN: 56 mL/min — AB (ref >60)

## 2017-09-05 LAB — MAGNESIUM: Magnesium: 2.1 mg/dL (ref 1.7–2.4)

## 2017-09-05 LAB — PHOSPHORUS: Phosphorus: 4 mg/dL (ref 2.5–4.6)

## 2017-09-05 SURGERY — LAPAROSCOPIC RIGHT COLON RESECTION
Anesthesia: General | Site: Abdomen

## 2017-09-05 MED ORDER — FENTANYL CITRATE (PF) 100 MCG/2ML IJ SOLN
25.0000 ug | INTRAMUSCULAR | Status: DC | PRN
  Administered 2017-09-05: 50 ug via INTRAVENOUS
  Administered 2017-09-05: 25 ug via INTRAVENOUS
  Administered 2017-09-05: 50 ug via INTRAVENOUS

## 2017-09-05 MED ORDER — FENTANYL CITRATE (PF) 100 MCG/2ML IJ SOLN
INTRAMUSCULAR | Status: DC | PRN
  Administered 2017-09-05 (×3): 50 ug via INTRAVENOUS

## 2017-09-05 MED ORDER — CLINDAMYCIN PHOSPHATE 900 MG/50ML IV SOLN
900.0000 mg | INTRAVENOUS | Status: AC
  Administered 2017-09-05: 900 mg via INTRAVENOUS
  Filled 2017-09-05: qty 50

## 2017-09-05 MED ORDER — ALVIMOPAN 12 MG PO CAPS
12.0000 mg | ORAL_CAPSULE | ORAL | Status: DC
  Filled 2017-09-05: qty 1

## 2017-09-05 MED ORDER — ACETAMINOPHEN 10 MG/ML IV SOLN
INTRAVENOUS | Status: DC | PRN
  Administered 2017-09-05: 1000 mg via INTRAVENOUS

## 2017-09-05 MED ORDER — SODIUM CHLORIDE 0.9% IV SOLUTION: Freq: Once | INTRAVENOUS | Status: DC

## 2017-09-05 MED ORDER — FENTANYL CITRATE (PF) 100 MCG/2ML IJ SOLN
INTRAMUSCULAR | Status: AC
  Filled 2017-09-05: qty 4

## 2017-09-05 MED ORDER — ROCURONIUM BROMIDE 10 MG/ML (PF) SYRINGE
PREFILLED_SYRINGE | INTRAVENOUS | Status: DC | PRN
  Administered 2017-09-05: 50 mg via INTRAVENOUS
  Administered 2017-09-05 (×3): 10 mg via INTRAVENOUS

## 2017-09-05 MED ORDER — KETOROLAC TROMETHAMINE 15 MG/ML IJ SOLN: 15.0000 mg | Freq: Four times a day (QID) | INTRAMUSCULAR | Status: DC | PRN

## 2017-09-05 MED ORDER — LIDOCAINE 2% (20 MG/ML) 5 ML SYRINGE
INTRAMUSCULAR | Status: AC
  Filled 2017-09-05: qty 5

## 2017-09-05 MED ORDER — ONDANSETRON HCL 4 MG PO TABS
4.0000 mg | ORAL_TABLET | Freq: Four times a day (QID) | ORAL | Status: DC | PRN
Start: 2017-09-05 — End: 2017-09-16

## 2017-09-05 MED ORDER — ENOXAPARIN SODIUM 40 MG/0.4ML ~~LOC~~ SOLN
40.0000 mg | SUBCUTANEOUS | Status: DC
  Administered 2017-09-06 – 2017-09-08 (×3): 40 mg via SUBCUTANEOUS
  Filled 2017-09-05 (×3): qty 0.4

## 2017-09-05 MED ORDER — CHLORHEXIDINE GLUCONATE CLOTH 2 % EX PADS
6.0000 | MEDICATED_PAD | Freq: Once | CUTANEOUS | Status: AC
  Administered 2017-09-05: 6 via TOPICAL

## 2017-09-05 MED ORDER — BUPIVACAINE-EPINEPHRINE (PF) 0.25% -1:200000 IJ SOLN
INTRAMUSCULAR | Status: DC | PRN
  Administered 2017-09-05: 25 mL via PERINEURAL

## 2017-09-05 MED ORDER — ALVIMOPAN 12 MG PO CAPS
12.0000 mg | ORAL_CAPSULE | Freq: Two times a day (BID) | ORAL | Status: DC
  Administered 2017-09-06 – 2017-09-08 (×6): 12 mg via ORAL
  Filled 2017-09-05 (×8): qty 1

## 2017-09-05 MED ORDER — GENTAMICIN SULFATE 40 MG/ML IJ SOLN
5.0000 mg/kg | INTRAVENOUS | Status: AC
  Administered 2017-09-05: 450 mg via INTRAVENOUS
  Filled 2017-09-05: qty 11.25

## 2017-09-05 MED ORDER — HYDROMORPHONE HCL 1 MG/ML IJ SOLN
1.0000 mg | INTRAMUSCULAR | Status: DC | PRN
  Administered 2017-09-05 – 2017-09-06 (×6): 1 mg via INTRAVENOUS
  Filled 2017-09-05 (×6): qty 1

## 2017-09-05 MED ORDER — LIDOCAINE 2% (20 MG/ML) 5 ML SYRINGE
INTRAMUSCULAR | Status: DC | PRN
  Administered 2017-09-05: 1 mg/kg/h via INTRAVENOUS

## 2017-09-05 MED ORDER — LACTATED RINGERS IV SOLN
INTRAVENOUS | Status: DC
  Administered 2017-09-05 – 2017-09-06 (×5): via INTRAVENOUS

## 2017-09-05 MED ORDER — SACCHAROMYCES BOULARDII 250 MG PO CAPS
250.0000 mg | ORAL_CAPSULE | Freq: Two times a day (BID) | ORAL | Status: DC
  Administered 2017-09-05 – 2017-09-09 (×9): 250 mg via ORAL
  Filled 2017-09-05 (×9): qty 1

## 2017-09-05 MED ORDER — ONDANSETRON HCL 4 MG/2ML IJ SOLN
INTRAMUSCULAR | Status: DC | PRN
  Administered 2017-09-05: 4 mg via INTRAVENOUS

## 2017-09-05 MED ORDER — DEXAMETHASONE SODIUM PHOSPHATE 10 MG/ML IJ SOLN
INTRAMUSCULAR | Status: AC
  Filled 2017-09-05: qty 1

## 2017-09-05 MED ORDER — PROPOFOL 10 MG/ML IV BOLUS
INTRAVENOUS | Status: DC | PRN
  Administered 2017-09-05: 100 mg via INTRAVENOUS

## 2017-09-05 MED ORDER — SUGAMMADEX SODIUM 200 MG/2ML IV SOLN
INTRAVENOUS | Status: DC | PRN
  Administered 2017-09-05: 200 mg via INTRAVENOUS

## 2017-09-05 MED ORDER — HYDROCODONE-ACETAMINOPHEN 5-325 MG PO TABS
1.0000 | ORAL_TABLET | ORAL | Status: DC | PRN
  Administered 2017-09-06 – 2017-09-08 (×4): 2 via ORAL
  Filled 2017-09-05 (×4): qty 2

## 2017-09-05 MED ORDER — BUPIVACAINE-EPINEPHRINE (PF) 0.25% -1:200000 IJ SOLN
INTRAMUSCULAR | Status: AC
  Filled 2017-09-05: qty 30

## 2017-09-05 MED ORDER — PROPOFOL 10 MG/ML IV BOLUS
INTRAVENOUS | Status: AC
  Filled 2017-09-05: qty 20

## 2017-09-05 MED ORDER — ONDANSETRON HCL 4 MG/2ML IJ SOLN: 4.0000 mg | Freq: Four times a day (QID) | INTRAMUSCULAR | Status: DC | PRN

## 2017-09-05 MED ORDER — RINGERS IRRIGATION IR SOLN
Status: DC | PRN
  Administered 2017-09-05: 1000 mL

## 2017-09-05 MED ORDER — ONDANSETRON HCL 4 MG/2ML IJ SOLN
INTRAMUSCULAR | Status: AC
  Filled 2017-09-05: qty 2

## 2017-09-05 MED ORDER — LIDOCAINE HCL 2 % IJ SOLN
INTRAMUSCULAR | Status: AC
  Filled 2017-09-05: qty 20

## 2017-09-05 MED ORDER — ROCURONIUM BROMIDE 10 MG/ML (PF) SYRINGE
PREFILLED_SYRINGE | INTRAVENOUS | Status: AC
  Filled 2017-09-05: qty 10

## 2017-09-05 MED ORDER — KETOROLAC TROMETHAMINE 15 MG/ML IJ SOLN
15.0000 mg | Freq: Four times a day (QID) | INTRAMUSCULAR | Status: AC
  Administered 2017-09-05: 15 mg via INTRAVENOUS
  Filled 2017-09-05: qty 1

## 2017-09-05 MED ORDER — LIDOCAINE 2% (20 MG/ML) 5 ML SYRINGE
INTRAMUSCULAR | Status: DC | PRN
  Administered 2017-09-05: 60 mg via INTRAVENOUS

## 2017-09-05 MED ORDER — ACETAMINOPHEN 10 MG/ML IV SOLN
INTRAVENOUS | Status: AC
  Filled 2017-09-05: qty 100

## 2017-09-05 MED ORDER — CLINDAMYCIN PHOSPHATE 900 MG/50ML IV SOLN
900.0000 mg | Freq: Three times a day (TID) | INTRAVENOUS | Status: AC
  Administered 2017-09-05: 900 mg via INTRAVENOUS
  Filled 2017-09-05: qty 50

## 2017-09-05 MED ORDER — LACTATED RINGERS IV SOLN
INTRAVENOUS | Status: DC
  Administered 2017-09-05: 09:00:00 via INTRAVENOUS

## 2017-09-05 MED ORDER — ENSURE SURGERY PO LIQD
237.0000 mL | Freq: Two times a day (BID) | ORAL | Status: DC
  Administered 2017-09-06 – 2017-09-07 (×4): 237 mL via ORAL
  Filled 2017-09-05 (×23): qty 237

## 2017-09-05 MED ORDER — SODIUM CHLORIDE 0.9 % IR SOLN
Status: DC | PRN
  Administered 2017-09-05: 2000 mL

## 2017-09-05 MED ORDER — FENTANYL CITRATE (PF) 250 MCG/5ML IJ SOLN
INTRAMUSCULAR | Status: AC
  Filled 2017-09-05: qty 5

## 2017-09-05 MED ORDER — PHENYLEPHRINE HCL 10 MG/ML IJ SOLN
INTRAMUSCULAR | Status: AC
  Filled 2017-09-05: qty 2

## 2017-09-05 SURGICAL SUPPLY — 62 items
APPLIER CLIP 5 13 M/L LIGAMAX5 (MISCELLANEOUS)
APPLIER CLIP ROT 10 11.4 M/L (STAPLE)
APR CLP MED LRG 11.4X10 (STAPLE)
APR CLP MED LRG 5 ANG JAW (MISCELLANEOUS)
BLADE EXTENDED COATED 6.5IN (ELECTRODE) IMPLANT
BLADE HEX COATED 2.75 (ELECTRODE) ×6 IMPLANT
CABLE HIGH FREQUENCY MONO STRZ (ELECTRODE) ×2 IMPLANT
CELLS DAT CNTRL 66122 CELL SVR (MISCELLANEOUS) IMPLANT
CLIP APPLIE 5 13 M/L LIGAMAX5 (MISCELLANEOUS) IMPLANT
CLIP APPLIE ROT 10 11.4 M/L (STAPLE) IMPLANT
CLOSURE WOUND 1/2 X4 (GAUZE/BANDAGES/DRESSINGS)
COUNTER NEEDLE 20 DBL MAG RED (NEEDLE) ×3 IMPLANT
COVER SURGICAL LIGHT HANDLE (MISCELLANEOUS) ×3 IMPLANT
DECANTER SPIKE VIAL GLASS SM (MISCELLANEOUS) ×3 IMPLANT
DEVICE TROCAR PUNCTURE CLOSURE (ENDOMECHANICALS) IMPLANT
DRAPE LAPAROSCOPIC ABDOMINAL (DRAPES) ×3 IMPLANT
DRAPE UTILITY XL STRL (DRAPES) ×3 IMPLANT
DRSG OPSITE POSTOP 4X6 (GAUZE/BANDAGES/DRESSINGS) ×2 IMPLANT
DRSG OPSITE POSTOP 4X8 (GAUZE/BANDAGES/DRESSINGS) IMPLANT
ELECT REM PT RETURN 15FT ADLT (MISCELLANEOUS) ×3 IMPLANT
GAUZE SPONGE 4X4 12PLY STRL (GAUZE/BANDAGES/DRESSINGS) ×3 IMPLANT
GLOVE EUDERMIC 7 POWDERFREE (GLOVE) ×6 IMPLANT
GOWN STRL REUS W/TWL XL LVL3 (GOWN DISPOSABLE) ×12 IMPLANT
LEGGING LITHOTOMY PAIR STRL (DRAPES) ×3 IMPLANT
LIGASURE IMPACT 36 18CM CVD LR (INSTRUMENTS) ×2 IMPLANT
NDL INSUFFLATION 14GA 120MM (NEEDLE) IMPLANT
NEEDLE INSUFFLATION 14GA 120MM (NEEDLE) ×3 IMPLANT
PACK COLON (CUSTOM PROCEDURE TRAY) ×3 IMPLANT
PAD POSITIONING PINK XL (MISCELLANEOUS) IMPLANT
PENCIL BUTTON HOLSTER BLD 10FT (ELECTRODE) ×2 IMPLANT
POSITIONER SURGICAL ARM (MISCELLANEOUS) IMPLANT
RELOAD PROXIMATE 75MM BLUE (ENDOMECHANICALS) ×6 IMPLANT
RELOAD STAPLE 75 3.8 BLU REG (ENDOMECHANICALS) IMPLANT
RETRACTOR WND ALEXIS 18 MED (MISCELLANEOUS) IMPLANT
RTRCTR WOUND ALEXIS 18CM MED (MISCELLANEOUS)
SCISSORS LAP 5X35 DISP (ENDOMECHANICALS) ×3 IMPLANT
SET IRRIG TUBING LAPAROSCOPIC (IRRIGATION / IRRIGATOR) ×3 IMPLANT
SHEARS HARMONIC ACE PLUS 36CM (ENDOMECHANICALS) ×2 IMPLANT
SLEEVE ADV FIXATION 5X100MM (TROCAR) ×8 IMPLANT
SOLUTION ANTI FOG 6CC (MISCELLANEOUS) ×3 IMPLANT
STAPLER GUN LINEAR PROX 60 (STAPLE) ×2 IMPLANT
STAPLER PROXIMATE 75MM BLUE (STAPLE) ×2 IMPLANT
STAPLER VISISTAT 35W (STAPLE) ×3 IMPLANT
STRIP CLOSURE SKIN 1/2X4 (GAUZE/BANDAGES/DRESSINGS) IMPLANT
SUT PDS AB 1 CT1 27 (SUTURE) IMPLANT
SUT PDS AB 1 CTX 36 (SUTURE) ×4 IMPLANT
SUT PDS AB 1 TP1 96 (SUTURE) IMPLANT
SUT SILK 2 0 (SUTURE) ×3
SUT SILK 2 0 SH CR/8 (SUTURE) ×3 IMPLANT
SUT SILK 2-0 18XBRD TIE 12 (SUTURE) ×1 IMPLANT
SUT SILK 3 0 (SUTURE) ×3
SUT SILK 3 0 SH CR/8 (SUTURE) ×3 IMPLANT
SUT SILK 3-0 18XBRD TIE 12 (SUTURE) ×1 IMPLANT
SUT VICRYL 0 UR6 27IN ABS (SUTURE) ×2 IMPLANT
TAPE CLOTH 4X10 WHT NS (GAUZE/BANDAGES/DRESSINGS) IMPLANT
TRAY FOLEY MTR SLVR 16FR STAT (SET/KITS/TRAYS/PACK) ×3 IMPLANT
TROCAR ADV FIXATION 5X100MM (TROCAR) ×2 IMPLANT
TROCAR XCEL BLUNT TIP 100MML (ENDOMECHANICALS) IMPLANT
TROCAR XCEL NON-BLD 11X100MML (ENDOMECHANICALS) ×1 IMPLANT
TUBING ARTHROSCOPY IRRIG 16FT (MISCELLANEOUS) ×2 IMPLANT
TUBING INSUF HEATED (TUBING) ×3 IMPLANT
TUBING IRRIGATION (MISCELLANEOUS) ×4 IMPLANT

## 2017-09-05 NOTE — Op Note (Signed)
Patient Name:           Joel [name]   Date of Surgery:        09/05/2017  Pre op Diagnosis:      Adenocarcinoma of the ascending colon  Post op Diagnosis:    Same, umbilical hernia  Procedure:                 Laparoscopic assisted right colectomy, primary repair of umbilical hernia  Surgeon:                     Edsel Petrin. Dalbert Batman, M.D., FACS  Assistant:                      Nadeen Landau, MD   Indication for Assistant: Complex exposure in obese patient.  Assistant indicated to provide exposure, expedite case, and reduce the incidence of intraoperative postop complications  Operative Indications:   This is a 84 year old gentleman with chronic atrial fibrillation, acute on chronic blood loss anemia, diabetes, pulmonary hypertension, pleural effusion status post thoracentesis, moderate aortic stenosis, COPD, hypertension.  The he was admitted for blood transfusion recently and colonoscopy showed adenocarcinoma of the proximal ascending colon.  There is no sign of metastasis.  He is felt to be at significant increased risk for anesthesia but without other option for management of his cancer.  He is brought to the operating room for right colon resection  Operative Findings:       There was a 3 to 4 cm palpable mass in the proximal ascending colon consistent with his colonoscopy and CT findings.  There was no sign of any liver metastasis.  A couple of mesenteric lymph nodes felt enlarged and they were sent for routine exam.  He had a small umbilical hernia and we had to debride omental adhesions out of that and we closed that primarily with a figure-of-eight suture of #1 Vicryl following the induction of general endotracheal anesthesia a Foley catheter was placed.  Procedure in Detail:        Following the induction of general endotracheal anesthesia a Foley catheter was placed.  The intravenous antibiotics were given.  The abdomen was prepped and draped in a sterile fashion surgical timeout was  performed.  0.5% Marcaine with epinephrine was used as local infiltration anesthetic. A Veress needle was inserted in the left subcostal region, tested, and connected to the insufflator.  Insufflation was uneventful.  The Veress needle was removed and a 5 mm trocar placed in the left subcostal region.  4 quadrant visualization revealed no bleeding or injury.  I placed a 5 mm trocar in the left lower quadrant to take down the adhesions around the umbilicus.  I then placed three 5 mm trochars in the midline above the umbilicus and one 5 mm trocar in the right mid abdomen. I identified the appendix and then the terminal ileum and ileocecal valve and cecum. I Identified and examined the liver and gallbladder.  I mobilized the right colon by dividing its lateral peritoneal attachments from the ileocecal mesentery all the way up the ascending colon.  The ascending colon was mobilized away from Gerota's fascia and the duodenum.  The hepatic flexure was taken down.  Harmonic scalpel was used to carefully dissected the transverse colon down from the gallbladder liver and stomach.  Ultimately we had excellent mobilization.  There was no bleeding.  The insufflation was released.  A short 8 cm midline incision was made above  the umbilicus.  Self-retaining retractor was placed.  The terminal ileum right colon and hepatic flexure were easily delivered into the wound.  The terminal ileum was divided with a GIA stapling device.  The right transverse colon was divided with a GIA stapling device.  Mesenteric vessels were scored and taken down.  Small vessels were divided with the LigaSure device and larger vessels were clamped divided and ligated with 2-0 silk ties.  The specimen was removed and sent to pathology.  We had about 5 cm proximal margin and about 17 cm distal margin. Anastomosis was created between the terminal ileum and the mid transverse colon with a GIA stapling device.     The common defect was closed with a TA 60  stapling device.  Silk sutures were placed in critical points to reinforce the staple line.  The lumen appeared about 3 cm was felt to be very adequate.  The anastomosis appeared intact and the bowel appeared pink and viable.  The abdomen was irrigated with saline.  The did not appear to be any bleeding.  The anastomosis was dropped back into the abdominal cavity and the omentum brought down over the top of this.  We changed our gowns gloves and instruments and drapes at this point.      The midline fascia was closed with a running suture of #1 PDS.  The wounds were irrigated.  The trocar sites were closed with skin staples.  The midline skin was closed loosely with skin staples and Telfa wicks.  Honeycomb bandage was placed.  Patient tolerated the procedure well and was taken to PACU in stable condition.  EBL 150 cc or less.  Counts correct.  Complications none.     Edsel Petrin. Dalbert Batman, M.D., FACS General and Minimally Invasive Surgery Breast and Colorectal Surgery  09/05/2017 11:52 AM

## 2017-09-05 NOTE — Progress Notes (Signed)
3 Days Post-Op  Subjective: Stable and alert.  No problems overnight. Appreciate cardiology and internal medicine evaluation.  Agree patient is obviously high risk but basically his medical problems are stable and unlikely to improve over time. His best option is to go ahead with laparoscopic assisted right colectomy today.  Comorbidities include atrial fibrillation.  Diabetes.  Pulmonary hypertension.  Right pleural effusion status post thoracentesis, moderate aortic stenosis, COPD, acute and chronic blood loss anemia, hypertension. He will need ICU postop and I told him that  Colonoscopy shows biopsy-proven adenocarcinoma in the region of the cecum.  CT scan shows a nonobstructing mass in this location but no signs of any metastatic disease.  Right pleural effusion was noted and underwent thoracentesis this weekend.  Lab work this morning shows hemoglobin stable at 8.7.  Glucose 125.  Creatinine being 1.25.  Potassium 4.1.  LFTs normal. Type and screen sent  Objective: Vital signs in last 24 hours: Temp:  [98.1 F (36.7 C)-98.5 F (36.9 C)] 98.1 F (36.7 C) (08/05 0433) Pulse Rate:  [63-66] 63 (08/05 0433) Resp:  [16-22] 18 (08/05 0433) BP: (125-138)/(59-89) 125/71 (08/05 0433) SpO2:  [95 %-99 %] 99 % (08/05 0433) Last BM Date: 09/03/17  Intake/Output from previous day: 08/04 0701 - 08/05 0700 In: -  Out: 1750 [Urine:1750] Intake/Output this shift: No intake/output data recorded.   EXAM: General appearance: Alert.  Cooperative.  No distress.  Sitting in chair.  Large body habitus.  Has good insight and understanding of the proposed procedure and its risks Resp: Reduced breath sounds bilaterally but no crackles or wheezes.  Not tachypneic.  Not wearing oxygen.  No use of accessory muscles. Cardiovascular.  Irregularly irregular.  Systolic murmur present.  Trace lower extremity edema. GI: Soft.  Nontender.  Protuberant abdomen.  Bowel sounds present.  Small umbilical  hernia.  Lab Results:  Recent Labs    09/04/17 0511 09/05/17 0504  WBC 3.3* 3.6*  HGB 8.7* 8.7*  HCT 27.9* 27.7*  PLT 176 173   BMET Recent Labs    09/04/17 0511 09/05/17 0504  NA 141 141  K 3.5 4.1  CL 103 103  CO2 29 30  GLUCOSE 121* 125*  BUN 15 15  CREATININE 1.18 1.25*  CALCIUM 9.0 9.2   PT/INR No results for input(s): LABPROT, INR in the last 72 hours. ABG No results for input(s): PHART, HCO3 in the last 72 hours.  Invalid input(s): PCO2, PO2  Studies/Results: Dg Chest 1 View  Result Date: 09/03/2017 CLINICAL DATA:  Status post right-sided thoracentesis EXAM: CHEST  1 VIEW COMPARISON:  09/01/2017 FINDINGS: Cardiac shadow is enlarged. Postsurgical changes are noted. No post thoracentesis pneumothorax is noted. Minimal residual right effusion is seen. Mild atelectatic changes are again noted in the left base. IMPRESSION: No pneumothorax following right thoracentesis. Electronically Signed   By: Inez Catalina M.D.   On: 09/03/2017 15:47   Dg Chest 2 View  Result Date: 09/04/2017 CLINICAL DATA:  Per order- SOB Hx of cancer, HTN, DM, CHF, AFIB, and COPD. EXAM: CHEST - 2 VIEW COMPARISON:  09/13/2017 FINDINGS: Median sternotomy. The heart is enlarged, stable in configuration. There is pulmonary vascular congestion and mild prominence of interstitial markings, consistent with mild edema. More focal opacity in the RIGHT mid lung zone raises question of early infiltrate. No pleural effusions. Degenerative changes are seen in thoracic spine. IMPRESSION: Cardiomegaly and mild edema. Question of early infiltrate in the RIGHT UPPER lobe. Followup PA and lateral chest X-ray is recommended  in 3-4 weeks following trial of antibiotic therapy to ensure resolution and exclude underlying malignancy. Electronically Signed   By: Nolon Nations M.D.   On: 09/04/2017 11:23   US Thoracentesis Asp Pleural Space W/img Guide  Result Date: 09/03/2017 INDICATION: Patient with history of shortness of  breath and right pleural effusion. Request is made for diagnostic and therapeutic right thoracentesis. EXAM: ULTRASOUND GUIDED DIAGNOSTIC AND THERAPEUTIC RIGHT THORACENTESIS MEDICATIONS: 10 mL of 1% lidocaine COMPLICATIONS: None immediate. PROCEDURE: An ultrasound guided thoracentesis was thoroughly discussed with the patient and questions answered. The benefits, risks, alternatives and complications were also discussed. The patient understands and wishes to proceed with the procedure. Written consent was obtained. Ultrasound was performed to localize and mark an adequate pocket of fluid in the right chest. The area was then prepped and draped in the normal sterile fashion. 1% Lidocaine was used for local anesthesia. Under ultrasound guidance a 6 Fr Safe-T-Centesis catheter was introduced. Thoracentesis was performed. The catheter was removed and a dressing applied. FINDINGS: A total of approximately 650 mL of clear gold fluid was removed. Samples were sent to the laboratory as requested by the clinical team. IMPRESSION: Successful ultrasound guided right thoracentesis yielding 650 mL of pleural fluid. Read by: Earley Abide, PA-C Electronically Signed   By: Jacqulynn Cadet M.D.   On: 09/03/2017 15:35    Anti-infectives: Anti-infectives (From admission, onward)   Start     Dose/Rate Route Frequency Ordered Stop   09/05/17 0748  clindamycin (CLEOCIN) IVPB 900 mg     900 mg 100 mL/hr over 30 Minutes Intravenous 60 min pre-op 09/05/17 0749     09/05/17 0748  gentamicin (GARAMYCIN) 540 mg in dextrose 5 % 100 mL IVPB     5 mg/kg  108.9 kg 113.5 mL/hr over 60 Minutes Intravenous 60 min pre-op 09/05/17 0749     08/31/17 1100  fluconazole (DIFLUCAN) tablet 100 mg     100 mg Oral Daily 08/31/17 0921 09/02/17 1726      Assessment/Plan:  Adenocarcinoma right colon.  Proceed with laparoscopic-assisted right colectomy, possible open colectomy today OR notified.  Plan surgery this morning May need  transfusion.  Type and screen sent ICU postop I have discussed the indications, details, techniques, and numerous risk of the surgery with him.  He is aware of the risk of bleeding, infection, anastomotic leak with reoperation and ostomy, injury to adjacent organs such as intestine or ureter or bladder with major reconstructive surgery, wound problems such as infection or dehiscence, cardiac pulmonary and thromboembolic problems.  He knows that his health may decline requiring skilled nursing facility.  He understands all these issues.  All his questions were answered.  He agrees with this plan.  Acute on chronic blood loss anemia chronic GI bleeding Right pleural effusion.  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  Moderate.  See echocardiogram and cardiology note COPD on home O2 Acute on chronic diastolic  Obstructive sleep apnea. Essential hypertension Persistent atrial fibrillation, currently on no anticoagulation.  Controlled with Cardizem History of bipolar affective disorder COPD Gout Recent right hip fracture CKD stage III   LOS: 6 days    Renelda Loma M Jordanne Elsbury 09/05/2017

## 2017-09-05 NOTE — Progress Notes (Signed)
Spoke with pt regarding cpap/bipap use at home.  Pt stated he uses bipap, not cpap at home.  Rx changed to bipap qhs.  Settings were 18/14 per chart notes, but pt stated he was unable to tolerate at those settings.  Bipap set for 16/12 per pt request and comfort with 2.5L o2 bleedin.  Pt tolerating well at this time.  OH60, rr12, spo2 95%.

## 2017-09-05 NOTE — Interval H&P Note (Signed)
History and Physical Interval Note:  09/05/2017 9:27 AM  [place]  has presented today for surgery, with the diagnosis of cancer right colon  The various methods of treatment have been discussed with the patient and family. After consideration of risks, benefits and other options for treatment, the patient has consented to  Procedure(s): LAPAROSCOPIC RIGHT COLON RESECTION (N/A) as a surgical intervention .  The patient's history has been reviewed, patient examined, no change in status, stable for surgery.  I have reviewed the patient's chart and labs.  Questions were answered to the patient's satisfaction.     Adin Hector

## 2017-09-05 NOTE — Progress Notes (Signed)
PT Cancellation Note  Patient Details Name: Joel [name] MRN: [id number] DOB: [birth year]   Cancelled Treatment:    Reason Eval/Treat Not Completed: Patient at procedure or test/unavailable; Pt to have R laparoscopic colectomy today due to adenocarcinoma of the cecum. Pt currently in pre-op. Will follow up with pt tomorrow, as schedule permits.   Julien Girt, PT, DPT  Pager # [phone]   [name] 09/05/2017, 8:50 AM

## 2017-09-05 NOTE — Transfer of Care (Signed)
Immediate Anesthesia Transfer of Care Note  Patient: Joel [name]  Procedure(s) Performed: LAPAROSCOPIC RIGHT COLON RESECTION (N/A Abdomen)  Patient Location: PACU  Anesthesia Type:General  Level of Consciousness: awake, alert , oriented and patient cooperative  Airway & Oxygen Therapy: Patient Spontanous Breathing and Patient connected to face mask oxygen  Post-op Assessment: Report given to RN, Post -op Vital signs reviewed and stable and Patient moving all extremities  Post vital signs: Reviewed and stable  Last Vitals:  Vitals Value Taken Time  BP 149/89 09/05/2017 11:54 AM  Temp    Pulse 71 09/05/2017 11:57 AM  Resp 20 09/05/2017 11:57 AM  SpO2 100 % 09/05/2017 11:57 AM  Vitals shown include unvalidated device data.  Last Pain:  Vitals:   09/04/17 2221  TempSrc:   PainSc: 1       Patients Stated Pain Goal: 1 (01/19/74 8832)  Complications: No apparent anesthesia complications

## 2017-09-05 NOTE — Anesthesia Procedure Notes (Signed)
Procedure Name: Intubation Date/Time: 09/05/2017 9:40 AM Performed by: Victoriano Lain, CRNA Pre-anesthesia Checklist: Patient identified, Emergency Drugs available, Suction available, Patient being monitored and Timeout performed Patient Re-evaluated:Patient Re-evaluated prior to induction Oxygen Delivery Method: Circle system utilized Preoxygenation: Pre-oxygenation with 100% oxygen Induction Type: IV induction Ventilation: Mask ventilation without difficulty and Oral airway inserted - appropriate to patient size Laryngoscope Size: Mac and 4 Grade View: Grade I Tube type: Oral Tube size: 7.5 mm Number of attempts: 1 Airway Equipment and Method: Stylet Placement Confirmation: ETT inserted through vocal cords under direct vision,  positive ETCO2 and breath sounds checked- equal and bilateral Secured at: 22 cm Tube secured with: Tape Dental Injury: Teeth and Oropharynx as per pre-operative assessment

## 2017-09-05 NOTE — Anesthesia Preprocedure Evaluation (Addendum)
Anesthesia Evaluation  Patient identified by MRN, date of birth, ID band Patient awake    Reviewed: Allergy & Precautions, NPO status , Patient's Chart, lab work & pertinent test results  Airway Mallampati: I  TM Distance: <3 FB Neck ROM: Full    Dental  (+) Edentulous Upper, Edentulous Lower   Pulmonary sleep apnea and Oxygen sleep apnea , COPD, former smoker,     + decreased breath sounds      Cardiovascular hypertension, Pt. on medications +CHF  + dysrhythmias Atrial Fibrillation + Valvular Problems/Murmurs AS  Rhythm:Irregular Rate:Normal     Neuro/Psych PSYCHIATRIC DISORDERS Anxiety Bipolar Disorder    GI/Hepatic Neg liver ROS, GERD  Medicated,  Endo/Other  diabetes, Type 2, Oral Hypoglycemic Agents  Renal/GU      Musculoskeletal   Abdominal (+) + obese,   Peds  Hematology   Anesthesia Other Findings - HLD  Reproductive/Obstetrics                            Lab Results  Component Value Date   WBC 3.6 (L) 09/05/2017   HGB 8.7 (L) 09/05/2017   HCT 27.7 (L) 09/05/2017   MCV 93.6 09/05/2017   PLT 173 09/05/2017   Lab Results  Component Value Date   CREATININE 1.25 (H) 09/05/2017   BUN 15 09/05/2017   NA 141 09/05/2017   K 4.1 09/05/2017   CL 103 09/05/2017   CO2 30 09/05/2017   Lab Results  Component Value Date   INR 1.07 08/30/2017   INR 1.03 05/14/2016   INR 1.46 02/27/2016   Echo: - Left ventricle: Wall thickness was increased in a pattern of   moderate LVH. Systolic function was normal. The estimated   ejection fraction was in the range of 55% to 60%. The study is   not technically sufficient to allow evaluation of LV diastolic   function. - Aortic valve: There was moderate stenosis. Valve area (VTI): 1.57   cm^2. Valve area (Vmax): 1.74 cm^2. Valve area (Vmean): 1.53   cm^2. - Mitral valve: Moderately calcified annulus. Mildly thickened,   mildly calcified leaflets  . There was mild regurgitation. Valve   area by continuity equation (using LVOT flow): 2.59 cm^2. - Left atrium: The atrium was moderately dilated. - Atrial septum: No defect or patent foramen ovale was identified. - Pulmonary arteries: PA peak pressure: 65 mm Hg (S). - Impressions: Gradients have increased since 2017 when they were   mean 16 mmHg and peak 26 mmHg.  EKG: atrial fibrillation.   Anesthesia Physical Anesthesia Plan  ASA: III  Anesthesia Plan: General   Post-op Pain Management:    Induction: Intravenous  PONV Risk Score and Plan: 3 and Ondansetron and Treatment may vary due to age or medical condition  Airway Management Planned: Oral ETT  Additional Equipment: None  Intra-op Plan:   Post-operative Plan: Possible Post-op intubation/ventilation  Informed Consent: I have reviewed the patients History and Physical, chart, labs and discussed the procedure including the risks, benefits and alternatives for the proposed anesthesia with the patient or authorized representative who has indicated his/her understanding and acceptance.     Plan Discussed with: CRNA  Anesthesia Plan Comments:        Anesthesia Quick Evaluation

## 2017-09-05 NOTE — Anesthesia Postprocedure Evaluation (Signed)
Anesthesia Post Note  Patient: Joel [name]  Procedure(s) Performed: LAPAROSCOPIC RIGHT COLON RESECTION (N/A Abdomen)     Patient location during evaluation: PACU Anesthesia Type: General Level of consciousness: awake and alert Pain management: pain level controlled Vital Signs Assessment: post-procedure vital signs reviewed and stable Respiratory status: spontaneous breathing, nonlabored ventilation, respiratory function stable and patient connected to nasal cannula oxygen Cardiovascular status: blood pressure returned to baseline and stable Postop Assessment: no apparent nausea or vomiting Anesthetic complications: no    Last Vitals:  Vitals:   09/05/17 1352 09/05/17 1400  BP:  (!) 153/71  Pulse:    Resp: 14 15  Temp:    SpO2:  97%    Last Pain:  Vitals:   09/05/17 1409  TempSrc:   PainSc: [place] Taiten Brawn

## 2017-09-05 NOTE — Progress Notes (Addendum)
Progress Note  Patient Name: Joel [name] Date of Encounter: 09/05/2017  Primary Cardiologist: No primary care provider on file.   Subjective   Patient just back from laparoscopic right colon resection.  Remains in atrial fibrillation with CVR  Inpatient Medications    Scheduled Meds: . [START ON 09/06/2017] alvimopan  12 mg Oral BID  . buPROPion  150 mg Oral Daily  . citalopram  20 mg Oral Daily  . diltiazem  120 mg Oral Daily  . [START ON 09/06/2017] enoxaparin (LOVENOX) injection  40 mg Subcutaneous Q24H  . [START ON 09/06/2017] feeding supplement  237 mL Oral BID BM  . fentaNYL      . furosemide  80 mg Oral Daily  . guaiFENesin  1,200 mg Oral BID  . insulin aspart  0-15 Units Subcutaneous TID WC  . insulin aspart  0-5 Units Subcutaneous QHS  . ipratropium  0.5 mg Nebulization BID  . iron polysaccharides  150 mg Oral BID  . ketorolac  15 mg Intravenous Q6H  . levalbuterol  0.63 mg Nebulization BID  . pantoprazole  40 mg Oral BID  . potassium chloride SA  20 mEq Oral Daily  . pravastatin  20 mg Oral q1800  . saccharomyces boulardii  250 mg Oral BID  . temazepam  15 mg Oral QHS   Continuous Infusions: . clindamycin (CLEOCIN) IV    . lactated ringers     PRN Meds: [DISCONTINUED] acetaminophen **OR** acetaminophen, fluticasone, HYDROcodone-acetaminophen, HYDROmorphone (DILAUDID) injection, ketorolac **FOLLOWED BY** ketorolac, meclizine, ondansetron **OR** ondansetron (ZOFRAN) IV, ondansetron **OR** ondansetron (ZOFRAN) IV   Vital Signs    Vitals:   09/05/17 1230 09/05/17 1245 09/05/17 1300 09/05/17 1309  BP: 139/74 129/75 (!) 146/66 134/71  Pulse: 79 71 78 79  Resp: 18 15 16 17   Temp:  (!) 97.4 F (36.3 C)  (!) 97.5 F (36.4 C)  TempSrc:      SpO2: 97% 96% 95% 96%  Weight:      Height:        Intake/Output Summary (Last 24 hours) at 09/05/2017 1331 Last data filed at 09/05/2017 1245 Gross per 24 hour  Intake 1011.25 ml  Output 1415 ml  Net -403.75 ml   Filed  Weights   08/30/17 1720 08/30/17 1724 08/30/17 2240  Weight: 240 lb (108.9 kg) 240 lb (108.9 kg) 240 lb (108.9 kg)    Telemetry    Atrial fibrillation with controlled ventricular response- Personally Reviewed  ECG    New EKG to review- Personally Reviewed  Physical Exam   GEN: No acute distress.   Neck: No JVD Cardiac: RRR, no murmurs, rubs, or gallops.  Respiratory: Clear to auscultation bilaterally. GI: Soft, nontender, non-distended  MS: No edema; No deformity. Neuro:  Nonfocal  Psych: Normal affect   Labs    Chemistry Recent Labs  Lab 09/03/17 0102 09/04/17 0511 09/05/17 0504 09/05/17 1235  NA 138 141 141 138  K 4.2 3.5 4.1 4.4  CL 103 103 103 103  CO2 26 29 30 26   GLUCOSE 131* 121* 125* 196*  BUN 19 15 15 15   CREATININE 1.15 1.18 1.25* 1.16  CALCIUM 9.1 9.0 9.2 8.9  PROT 6.9 6.5 6.7  --   ALBUMIN 3.7 3.5 3.6  --   AST 16 15 22   --   ALT 10 10 10   --   ALKPHOS 55 53 57  --   BILITOT 0.6 0.5 0.6  --   GFRNONAA 57* 56* 52* 57*  GFRAA >  60 >60 >60 >60  ANIONGAP 9 9 8 9      Hematology Recent Labs  Lab 09/04/17 0511 09/05/17 0504 09/05/17 1235  WBC 3.3* 3.6* 5.8  RBC 2.96* 2.96* 3.04*  HGB 8.7* 8.7* 9.0*  HCT 27.9* 27.7* 28.5*  MCV 94.3 93.6 93.8  MCH 29.4 29.4 29.6  MCHC 31.2 31.4 31.6  RDW 15.9* 15.7* 15.7*  PLT 176 173 191    Cardiac EnzymesNo results for input(s): TROPONINI in the last 168 hours. No results for input(s): TROPIPOC in the last 168 hours.   BNP Recent Labs  Lab 09/01/17 0037  BNP 237.1*     DDimer No results for input(s): DDIMER in the last 168 hours.   Radiology    Dg Chest 1 View  Result Date: 09/03/2017 CLINICAL DATA:  Status post right-sided thoracentesis EXAM: CHEST  1 VIEW COMPARISON:  09/01/2017 FINDINGS: Cardiac shadow is enlarged. Postsurgical changes are noted. No post thoracentesis pneumothorax is noted. Minimal residual right effusion is seen. Mild atelectatic changes are again noted in the left base.  IMPRESSION: No pneumothorax following right thoracentesis. Electronically Signed   By: Inez Catalina M.D.   On: 09/03/2017 15:47   Dg Chest 2 View  Result Date: 09/04/2017 CLINICAL DATA:  Per order- SOB Hx of cancer, HTN, DM, CHF, AFIB, and COPD. EXAM: CHEST - 2 VIEW COMPARISON:  09/13/2017 FINDINGS: Median sternotomy. The heart is enlarged, stable in configuration. There is pulmonary vascular congestion and mild prominence of interstitial markings, consistent with mild edema. More focal opacity in the RIGHT mid lung zone raises question of early infiltrate. No pleural effusions. Degenerative changes are seen in thoracic spine. IMPRESSION: Cardiomegaly and mild edema. Question of early infiltrate in the RIGHT UPPER lobe. Followup PA and lateral chest X-ray is recommended in 3-4 weeks following trial of antibiotic therapy to ensure resolution and exclude underlying malignancy. Electronically Signed   By: Nolon Nations M.D.   On: 09/04/2017 11:23   US Thoracentesis Asp Pleural Space W/img Guide  Result Date: 09/03/2017 INDICATION: Patient with history of shortness of breath and right pleural effusion. Request is made for diagnostic and therapeutic right thoracentesis. EXAM: ULTRASOUND GUIDED DIAGNOSTIC AND THERAPEUTIC RIGHT THORACENTESIS MEDICATIONS: 10 mL of 1% lidocaine COMPLICATIONS: None immediate. PROCEDURE: An ultrasound guided thoracentesis was thoroughly discussed with the patient and questions answered. The benefits, risks, alternatives and complications were also discussed. The patient understands and wishes to proceed with the procedure. Written consent was obtained. Ultrasound was performed to localize and mark an adequate pocket of fluid in the right chest. The area was then prepped and draped in the normal sterile fashion. 1% Lidocaine was used for local anesthesia. Under ultrasound guidance a 6 Fr Safe-T-Centesis catheter was introduced. Thoracentesis was performed. The catheter was removed and  a dressing applied. FINDINGS: A total of approximately 650 mL of clear gold fluid was removed. Samples were sent to the laboratory as requested by the clinical team. IMPRESSION: Successful ultrasound guided right thoracentesis yielding 650 mL of pleural fluid. Read by: Earley Abide, PA-C Electronically Signed   By: Jacqulynn Cadet M.D.   On: 09/03/2017 15:35    Cardiac Studies   2D echocardiogram 09/01/2017 Study Conclusions  - Left ventricle: Wall thickness was increased in a pattern of   moderate LVH. Systolic function was normal. The estimated   ejection fraction was in the range of 55% to 60%. The study is   not technically sufficient to allow evaluation of LV diastolic   function. -  Aortic valve: There was moderate stenosis. Valve area (VTI): 1.57   cm^2. Valve area (Vmax): 1.74 cm^2. Valve area (Vmean): 1.53   cm^2. - Mitral valve: Moderately calcified annulus. Mildly thickened,   mildly calcified leaflets . There was mild regurgitation. Valve   area by continuity equation (using LVOT flow): 2.59 cm^2. - Left atrium: The atrium was moderately dilated. - Atrial septum: No defect or patent foramen ovale was identified. - Pulmonary arteries: PA peak pressure: 65 mm Hg (S). - Impressions: Gradients have increased since 2017 when they were   mean 16 mmHg and peak 26 mmHg.  Impressions:  - Gradients have increased since 2017 when they were mean 16 mmHg   and peak 26 mmHg.   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 Long history of GI bleeding and anemia with AVM's as well. Chronic afib with anticoagulation held multiple times in past due to bleeding. Also COPD on home oxygen. 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 He has had recent fall and can barely ambulate. Currently no chest pain mild dyspnea and no  palpitations. Afib rate control is fine Hct is low at 28   Assessment & Plan    1.  Permanent atrial fibrillation -He is well controlled on telemetry at this time. -CHADS2VASC score is 4 -Restart Cardizem CD 120 mg daily when taking p.o. to control heart rate -if heart rate increases in the interim would need to start IV Cardizem drip -He has not been on anticoagulation recently because of problems with bleeding. -Would start on Eliquis 5 mg twice daily once felt to be stable from a surgical standpoint  2.  Moderate aortic stenosis by recent echo with mean gradient 33mmHg -Will need to continue outpatient monitoring with yearly echo  3.  HTN -BP controlled post op -He is on Cardizem CD 120 mg daily and losartan 25 mg daily at home -Restart when taking p.o. if blood pressure remains stable  4.  OSA on BiPAP -Cntinue BiPAP therapy per respiratory  5. Colon CA -Status post laparoscopic right colectomy -Per surgery  I have spent a total of 35 minutes with patient reviewing hospital notes , telemetry, EKGs, labs and examining patient as well as establishing an assessment and plan that was discussed with the patient.  > 50% of time was spent in direct patient care.       For questions or updates, please contact [facility] Please consult [email or web address] for contact info under Cardiology/STEMI.      Signed, Fransico Him, MD  09/05/2017, 1:31 PM

## 2017-09-05 NOTE — Progress Notes (Signed)
PROGRESS NOTE    Joel [name]  VQQ:[id number] DOB: November 07, 1932 DOA: 08/30/2017 PCP: Wenda Low, MD   Brief Narrative: Joel [name] is a 84 y.o. male with medical history significant for CAD, Afib not on anticoagulation due to history of recurrent GI bleed felt to be from small gastric AVMs causing iron deficiency anemia. He required several transfusions last year, but has since been doing well. He has been following up with Hematology but has not followed up there since March 2019 it seems. The plan was for him to follow up monthlly with transfusions to keep Hb above 9. He has been doing well, but in the last 2-3 weeks he noted some abdominal bloating as well as weaness. Yesterday he want for a bone density scan and asked that his hemoglobin be checked, he was called to come to ER today due to Hb of 7. Denies nausea, abdominal pain, reflux, or BRBPR. He has very dark stools but he takes iron BID.  He has had some SOB with exertion, but no chest pain, dizziness or syncope.  In the ER repeat lab confirmed Hb 7, two units of blood have been ordered and Hemoccult was positive for blood. Hospitalist was asked to admit, and a call has been placed to GI for inpatient consultation. Patient underwent EGD which showed Gastritis, Duodenitis, and Esophageal Candidiasis. Repeat Hb improved to 9.0 but dropped again so GI did colonoscopy which showed a Cecal Mass so General Surgery was consulted.   Patient complained of Dyspnea so was given IV lasix on 09/01/17 and started on Breathing treatments with improvement ECHOCardiogram being checked. He appeared to be volume overloaded with a mild Diastolic CHF Exacerbation so lasix was increased. Cardiology was consulted for preoperative clearance. Patient underwent surgical resection of adenocarcinoma of the ascending colon laparoscopically and also had primary repair of umbilical hernia. Post-Operatively he was taken to the ICU.   Assessment & Plan:   Principal  Problem:   Cancer of ascending colon ([place] 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  Localized mild inflammation characterized by erosions and erythema was found in the duodenal bulb -Continue to Monitor for S/Sx of Bleeding -C/w Iron Polysaccharides 150 mg po BID -Protonix 40 mg po Daily increased to 40 mg po BID by Gastroenterology  -Stopped Monitoring Hb/Hct q8h -GI took the patient for colonoscopy as his Hb/Hct was dropping and because he has not had a colonoscopy in 5 years. -Colonoscopy revealed an ulcerated, likely malignant non-obstructing medium-sized mass in the cecum and a 6 mm polyp in the rectum that was sessile along with sigmoid colon diverticula -GI feels cecal mass is strongly suggestive of cancer and likely accounts for chronic blood loss; Pathology Confirms Adenocarcinoma -General Surgery took the patient for Right Colectomy today and then he was taken to the ICU  -Continue to Monitor and repeat CBC in  AM   DMType2 complicated by Diabetic Neuropathy in feet -HbA1c was 6.0  -Continue to Monitor CBG's -Hold Metformin 1000 mg po BID and Glimepiride 1 mg po Daily with Breakfast  -C/w Gabapentin  -Continued  Moderate Novolog SSI q4h while NPO for possible GI Intervention but will change to Moderate AC/HS now  that patient is on a Heart Healthy/Carb Modified Diet  -CBG's ranging from 130-192; Continue to Monitor CBG's closely   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 -Check CT Chest/Abd/Pelvis with Contrast to evaluate for Mets  -GI consulted General Surgery who is to evaluate and recommending likely resection and would plan to proceed early next week -Per Surgery will need to remain on Clear Liquid Diet throughouth the weekend -Surgery requesting Cardiology to evaluate him for Pre-Surgical Risk Stratification  -Patient underwent Laparscopic Surgical Right Colectomy today and Umbilical Hernia Repair   Right Pleural Effusion -CT Scan showed Moderate Fluid -Obtain Thoracentesis; Done and obtained 650 mL of Clear Gold Fluid  -C/w Diuresis and increased to 80 mg po Daily and given 1 dose of IV Lasix yesterday. Continue po Lasix 80 mg  pHTN -Increased Lasix 40 mg Daily to 80 mg po Daily (Home Dose);  -Given IV Lasix 09/03/17 -C/w Diuresis with Home Dose as he appears Euvolemic now   HTN -C/w Diltiazem 120 mg po Daily, Hold Losartan 25 mg po Daily for now currently that he got diuresed and will get IV Contrast along with Surgery -Diltiazem held due to Surgery   Chronic GIB -As Above; Likely from Cecal Mass   Persistent Atrial Fibrillation  -s/p Multiple DCCV -C/w Diltiazem 120 mg po Daily -CHADS2VASc Elevated of 4 but not on Anticoagulation due chronic GIB -Per Cardiology should have ben on Baby ASA but stopped.  -Cardiology Consulted for Pre-Operative Clearance; recommending continuing Cardizem for Rate control and states will need to be changed to IV post-op however Dr. Radford Pax recommending po Cardizem when patient is able to tolerate po and IV Cardizem gtt in the interm if HR increases   Aortic Stenosis  -Was Moderate on last ECHO at Cardiology office -Per Cardiology monitor clinically and follow with repeat [facility] though he is a poor candidate in the future even probably for  TAVR -Repeat ECHO this Visit as below  -Cardiology Consulted for Pre-Op Clearance and patient is a High Risk  -Cardiology recommending yearly [facility]  COPD on Home O2 -C/w 2 liters of O2 via [place]; Was not wearing it so desaturated earlier this admission; Now back on O2 -May have a mild Exacerbation because he felt dyspneic so will start patient on Xopenex/Atrovent q6h and place on Guaifenesin 1200 mg po BID  -C/w Dextromethorphan-Guaifenesin 30-600 mg po q12h -Had some wheezing post-operatively   Bipolar Affective Disorder -C/w Citalopram but to 20 mg po Daily and Bupropion 150 mg po Daily  -C/w Temazepam 15 mg po qHS  OSA -Ordered CPAP -Was placed on BiPAP 16/12 with 2 Liters of O2 Humidified overnight   Esophageal Candidiasis -Noted on EGD with plaques consistent with Candidiasis -Started on Fluconazole 100 mg po TID x3 days per GI and is to stopped 09/02/17  HLD/Dyslipidemia  -C/w Lovastatin 20 mg po Daily qHS   Gout -C/w Home Allopurinol 100 mg po Daily   Recent Right Hip Fracture -PT/OT to Evaluate and Treat -X-Ray on 06/07/17 showed: "Standing AP pelvis and frog-leg lateral x-ray demonstrates cannulated screw fixation femoral neck fracture with slight backout of cannulated screws unchanged from March 2019. No evidence of AVN  and no displacement with interval healing of the femoral neck fracture.  Impression: Satisfactory cannulated screw fixation femoral neck fracture  with good position." -Dr Lorin Mercy recommended Ambulation and LE Strengthening   CKD Stage 3 -Cr appears to be elevated since the beginning of the year -Maybe new Baseline; BUN/Cr now 15/1.25 this AM  -BUN/Cr stable compared to prior values this year -Avoid Nephrotoxic Medications if possible and monitor closely given that patient is to receive contrast dye for CT Scans  -Repeat CMP in AM   BPPV -C/w Home Meclizine 25 mg po TIDprn  Dyspnea, significantly improved  -Likely Multifactorial and in the setting of  COPD, Anemia and mild volume overload with Acute on Chronic Diastolic CHF, and Right Plueral Effusion -Bronchodilators as above -Check ECHOCardiogram and Chest X-Ray -ECHOCardiogram showed the estimated  ejection fraction was in the range of 55% to 60%. The study is not technically sufficient to allow evaluation of LV diastolic function. -CXR showed Cardiomegaly and mild pulmonary edema. -Lasix as above -Flutter Valve, Incentive Spirometry, and Guaifenesin -Obtained Right Thoracentesis  -Continue to Monitor Respiratory Status Carefully   Acute on Chronic Diastolic CHF, mild Exacerbation but now improved -Had 1+ LE Edema and crackles earlier this Admission  -Increased po Lasix to 80 mg Daily -Gave IV Lasix as well.  -Strict I's/O's, Daily Weights; Patient is -3,304 Liters  -Cardiology Consulted for Pre-Op Clearance and patient is high risk -Continue to Monitor Volume Status   DVT prophylaxis: SCDs given concern for Blood Loss Anemia Code Status: FULL CODE Family Communication: Discussed with Grandchildren at Bedside  Disposition Plan: Pending Further workup and evaluation by General Surgery  Consultants:   [facility] Gastroenterology (signed off)  General Surgery   Cardiology    Procedures:  EGD Findings:      Diffuse, white plaques were found in the mid esophagus.      Diffuse moderate inflammation characterized by erosions, erythema and       friability was found in the entire examined stomach.      Localized mild inflammation characterized by erosions and erythema was       found in the duodenal bulb. Impression:               - Esophageal plaques were found, consistent with                            candidiasis. Due to the patient's history of                            diabetes and the fact that he had been bleeding                            this was not biopsy                           - Gastritis.                           - Duodenitis.                           - No  specimens collected.  ECHOCARDIOGRAM ------------------------------------------------------------------- Study Conclusions  - Left ventricle: Wall thickness was increased in a pattern of   moderate LVH. Systolic  function was normal. The estimated   ejection fraction was in the range of 55% to 60%. The study is   not technically sufficient to allow evaluation of LV diastolic   function. - Aortic valve: There was moderate stenosis. Valve area (VTI): 1.57   cm^2. Valve area (Vmax): 1.74 cm^2. Valve area (Vmean): 1.53   cm^2. - Mitral valve: Moderately calcified annulus. Mildly thickened,   mildly calcified leaflets . There was mild regurgitation. Valve   area by continuity equation (using LVOT flow): 2.59 cm^2. - Left atrium: The atrium was moderately dilated. - Atrial septum: No defect or patent foramen ovale was identified. - Pulmonary arteries: PA peak pressure: 65 mm Hg (S). - Impressions: Gradients have increased since 2017 when they were   mean 16 mmHg and peak 26 mmHg.  Impressions:  - Gradients have increased since 2017 when they were mean 16 mmHg   and peak 26 mmHg.  COLONOSCOPY Findings:      The digital rectal exam findings include flat prostate bed.      An ulcerated non-obstructing medium-sized MASS was found in the cecum.       The mass was non-circumferential. No bleeding was present. This was       biopsied with a cold forceps for histology. Estimated blood loss was       minimal.      A 6 mm polyp was found in the rectum (benign-appearing lesion). The       polyp was sessile. The polyp was removed with a cold snare. Resection       and retrieval were complete. Estimated blood loss: 3 mL.      Multiple large-mouthed diverticula were found in the sigmoid colon.      The exam was otherwise normal throughout the examined colon.      The terminal ileum appeared normal.      The retroflexed view of the distal rectum and anal verge was normal and       showed no  anal or rectal abnormalities. Impression:               - Flat prostate bed found on digital rectal exam.                           - LIKELY MALIGNANT MASS in the cecum. Biopsied.                            This could well account for patient's                            recurrent/ongoing low grade blood loss.                           - One benign appearing 6 mm polyp in the rectum,                            removed with a cold snare. Resected and retrieved.                           - Diverticulosis in the sigmoid colon.                           -  The examined portion of the ileum was normal.                           - The distal rectum and anal verge are normal on                            retroflexion view.   Antimicrobials:  Anti-infectives (From admission, onward)   Start     Dose/Rate Route Frequency Ordered Stop   09/05/17 1800  clindamycin (CLEOCIN) IVPB 900 mg     900 mg 100 mL/hr over 30 Minutes Intravenous Every 8 hours 09/05/17 1320 09/06/17 0559   09/05/17 0815  gentamicin (GARAMYCIN) 450 mg in dextrose 5 % 100 mL IVPB     5 mg/kg  90.1 kg (Adjusted) 111.3 mL/hr over 60 Minutes Intravenous To Short Stay 09/05/17 0749 09/05/17 1101   09/05/17 0748  clindamycin (CLEOCIN) IVPB 900 mg     900 mg 100 mL/hr over 30 Minutes Intravenous 60 min pre-op 09/05/17 0749 09/05/17 1006   08/31/17 1100  fluconazole (DIFLUCAN) tablet 100 mg     100 mg Oral Daily 08/31/17 0921 09/02/17 1726     Subjective: Seen and examined in PACU and he felt ok. States his abdomen was hurting along with his Right Leg. No CP but had mild SOB. No other complaints or concerns and wanting to rest.   Objective: Vitals:   09/05/17 1310 09/05/17 1324 09/05/17 1352 09/05/17 1400  BP: 134/71   (!) 153/71  Pulse: 79     Resp:   14 15  Temp:      TempSrc:      SpO2: 97%   97%  Weight:  104.7 kg (230 lb 13.2 oz)    Height:        Intake/Output Summary (Last 24 hours) at 09/05/2017 1447 Last data  filed at 09/05/2017 1245 Gross per 24 hour  Intake 1011.25 ml  Output 1415 ml  Net -403.75 ml   Filed Weights   08/30/17 1724 08/30/17 2240 09/05/17 1324  Weight: 108.9 kg (240 lb) 108.9 kg (240 lb) 104.7 kg (230 lb 13.2 oz)   Examination: Physical Exam:  Constitutional: Well-nourished, well-developed obese Caucasian male is currently being monitored in PACU and is calm and currently no acute distress Eyes: Sclera anicteric.  Lids extremities are normal ENMT: External ears and nose appear normal Neck: Appears supple with no JVD Respiratory: Diminished to auscultation bilaterally with some mild wheezing and coarse breath sounds.  Is not labored but is wearing supplemental oxygen via nasal cannula Cardiovascular: Irregularly irregular but not tachycardic.  No appreciable lower extremity edema has a 3 out of 6 systolic murmur Abdomen: Soft, tender to palpate, distended secondary body habitus and from insufflation during surgery.  Abdomen is slightly taut and distended.  Bowel sounds are diminished  GU: Deferred Musculoskeletal: No contractures or cyanosis.  No joint deformities noted Skin: Skin is warm and dry.  Incisions appeared slightly bloody and a honeycomb dressing midline in his abdomen has some blood around it. Neurologic: Cranial nerves II through XII grossly intact with no appreciable focal deficits Psychiatric: Normal mood and affect.  Intact judgment insight.  Patient is awake now and alert.  Data Reviewed: I have personally reviewed following labs and imaging studies  CBC: Recent Labs  Lab 09/01/17 0037  09/02/17 0054  09/03/17 0102 09/04/17 1829 09/05/17 0504 09/05/17 1235 09/05/17 1333  WBC 6.6  --  4.5  --  4.1 3.3* 3.6* 5.8 6.2  NEUTROABS 5.2  --  3.2  --  2.9 2.2 2.6  --   --   HGB 8.4*   < > 8.9*   < > 8.8* 8.7* 8.7* 9.0* 9.0*  HCT 27.2*   < > 28.4*   < > 28.0* 27.9* 27.7* 28.5* 28.7*  MCV 94.1  --  95.0  --  94.6 94.3 93.6 93.8 94.1  PLT 177  --  188  --  180  176 173 191 194   < > = values in this interval not displayed.   Basic Metabolic Panel: Recent Labs  Lab 09/01/17 0037 09/02/17 0054 09/03/17 0102 09/04/17 0511 09/05/17 0504 09/05/17 1235 09/05/17 1333  NA 142 145 138 141 141 138  --   K 4.3 4.2 4.2 3.5 4.1 4.4  --   CL 107 107 103 103 103 103  --   CO2 26 30 26 29 30 26   --   GLUCOSE 174* 140* 131* 121* 125* 196*  --   BUN 24* 21 19 15 15 15   --   CREATININE 1.31* 1.18 1.15 1.18 1.25* 1.16 1.32*  CALCIUM 8.8* 9.2 9.1 9.0 9.2 8.9  --   MG 2.4 2.3 2.2 2.2 2.1  --   --   PHOS 4.2 3.9 4.0 4.5 4.0  --   --    GFR: Estimated Creatinine Clearance: 53.9 mL/min (A) (by C-G formula based on SCr of 1.32 mg/dL (H)). Liver Function Tests: Recent Labs  Lab 09/01/17 0037 09/02/17 0054 09/03/17 0102 09/04/17 0511 09/05/17 0504  AST 15 13* 16 15 22   ALT 12 12 10 10 10   ALKPHOS 60 59 55 53 57  BILITOT 0.8 0.9 0.6 0.5 0.6  PROT 6.8 7.1 6.9 6.5 6.7  ALBUMIN 3.6 3.6 3.7 3.5 3.6   No results for input(s): LIPASE, AMYLASE in the last 168 hours. No results for input(s): AMMONIA in the last 168 hours. Coagulation Profile: Recent Labs  Lab 08/30/17 2135  INR 1.07   Cardiac Enzymes: No results for input(s): CKTOTAL, CKMB, CKMBINDEX, TROPONINI in the last 168 hours. BNP (last 3 results) No results for input(s): PROBNP in the last 8760 hours. HbA1C: Recent Labs    09/05/17 0800  HGBA1C 6.1*   CBG: Recent Labs  Lab 09/04/17 1202 09/04/17 1702 09/04/17 1943 09/05/17 0735 09/05/17 1159  GLUCAP 198* 142* 166* 130* 192*   Lipid Profile: No results for input(s): CHOL, HDL, LDLCALC, TRIG, CHOLHDL, LDLDIRECT in the last 72 hours. Thyroid Function Tests: No results for input(s): TSH, T4TOTAL, FREET4, T3FREE, THYROIDAB in the last 72 hours. Anemia Panel: No results for input(s): VITAMINB12, FOLATE, FERRITIN, TIBC, IRON, RETICCTPCT in the last 72 hours. Sepsis Labs: No results for input(s): PROCALCITON, LATICACIDVEN in the last  168 hours.  Recent Results (from the past 240 hour(s))  Body fluid culture     Status: None (Preliminary result)   Collection Time: 09/03/17  3:54 PM  Result Value Ref Range Status   Specimen Description   Final    PLEURAL RIGHT Performed at [facility] [address] [place], [place] [address]    Special Requests   Final    NONE Performed at [facility], [place] [address] [place], [place] [address]    Gram Stain   Final    WBC PRESENT,BOTH PMN AND MONONUCLEAR NO ORGANISMS SEEN CYTOSPIN SMEAR    Culture  Final    NO GROWTH 2 DAYS Performed at [facility], [place] [address], [place], [place] [address]    Report Status PENDING  Incomplete  Surgical pcr screen     Status: None   Collection Time: 09/05/17  8:26 AM  Result Value Ref Range Status   MRSA, PCR NEGATIVE NEGATIVE Final   Staphylococcus aureus NEGATIVE NEGATIVE Final    Comment: (NOTE) The Xpert SA Assay (FDA approved for NASAL specimens in patients 77 years of age and older), is one component of a comprehensive surveillance program. It is not intended to diagnose infection nor to guide or monitor treatment. Performed at [facility], [place] [address], [place], [place] [address]      Radiology Studies: Dg Chest 1 View  Result Date: 09/03/2017 CLINICAL DATA:  Status post right-sided thoracentesis EXAM: CHEST  1 VIEW COMPARISON:  09/01/2017 FINDINGS: Cardiac shadow is enlarged. Postsurgical changes are noted. No post thoracentesis pneumothorax is noted. Minimal residual right effusion is seen. Mild atelectatic changes are again noted in the left base. IMPRESSION: No pneumothorax following right thoracentesis. Electronically Signed   By: Inez Catalina M.D.   On: 09/03/2017 15:47   Dg Chest 2 View  Result Date: 09/04/2017 CLINICAL DATA:  Per order- SOB Hx of cancer, HTN, DM, CHF, AFIB, and COPD. EXAM: CHEST - 2 VIEW COMPARISON:  09/13/2017 FINDINGS: Median  sternotomy. The heart is enlarged, stable in configuration. There is pulmonary vascular congestion and mild prominence of interstitial markings, consistent with mild edema. More focal opacity in the RIGHT mid lung zone raises question of early infiltrate. No pleural effusions. Degenerative changes are seen in thoracic spine. IMPRESSION: Cardiomegaly and mild edema. Question of early infiltrate in the RIGHT UPPER lobe. Followup PA and lateral chest X-ray is recommended in 3-4 weeks following trial of antibiotic therapy to ensure resolution and exclude underlying malignancy. Electronically Signed   By: Nolon Nations M.D.   On: 09/04/2017 11:23   US Thoracentesis Asp Pleural Space W/img Guide  Result Date: 09/03/2017 INDICATION: Patient with history of shortness of breath and right pleural effusion. Request is made for diagnostic and therapeutic right thoracentesis. EXAM: ULTRASOUND GUIDED DIAGNOSTIC AND THERAPEUTIC RIGHT THORACENTESIS MEDICATIONS: 10 mL of 1% lidocaine COMPLICATIONS: None immediate. PROCEDURE: An ultrasound guided thoracentesis was thoroughly discussed with the patient and questions answered. The benefits, risks, alternatives and complications were also discussed. The patient understands and wishes to proceed with the procedure. Written consent was obtained. Ultrasound was performed to localize and mark an adequate pocket of fluid in the right chest. The area was then prepped and draped in the normal sterile fashion. 1% Lidocaine was used for local anesthesia. Under ultrasound guidance a 6 Fr Safe-T-Centesis catheter was introduced. Thoracentesis was performed. The catheter was removed and a dressing applied. FINDINGS: A total of approximately 650 mL of clear gold fluid was removed. Samples were sent to the laboratory as requested by the clinical team. IMPRESSION: Successful ultrasound guided right thoracentesis yielding 650 mL of pleural fluid. Read by: Earley Abide, PA-C Electronically  Signed   By: Jacqulynn Cadet M.D.   On: 09/03/2017 15:35     Scheduled Meds: . [START ON 09/06/2017] alvimopan  12 mg Oral BID  . buPROPion  150 mg Oral Daily  . citalopram  20 mg Oral Daily  . diltiazem  120 mg Oral Daily  . [START ON 09/06/2017] enoxaparin (LOVENOX) injection  40 mg Subcutaneous Q24H  . [START ON 09/06/2017] feeding supplement  237 mL Oral BID BM  . fentaNYL      . furosemide  80 mg Oral Daily  . guaiFENesin  1,200 mg Oral BID  . insulin aspart  0-15 Units Subcutaneous TID WC  . insulin aspart  0-5 Units Subcutaneous QHS  . ipratropium  0.5 mg Nebulization BID  . iron polysaccharides  150 mg Oral BID  . levalbuterol  0.63 mg Nebulization BID  . pantoprazole  40 mg Oral BID  . potassium chloride SA  20 mEq Oral Daily  . pravastatin  20 mg Oral q1800  . saccharomyces boulardii  250 mg Oral BID  . temazepam  15 mg Oral QHS   Continuous Infusions: . clindamycin (CLEOCIN) IV    . lactated ringers 100 mL/hr at 09/05/17 1336    LOS: 6 days   Kerney Elbe, DO [facility] Pager [phone]  If 7PM-7AM, please contact night-coverage [email or web address] Password [name] 09/05/2017, 2:47 PM

## 2017-09-05 NOTE — H&P (View-Only) (Signed)
3 Days Post-Op  Subjective: Stable and alert.  No problems overnight. Appreciate cardiology and internal medicine evaluation.  Agree patient is obviously high risk but basically his medical problems are stable and unlikely to improve over time. His best option is to go ahead with laparoscopic assisted right colectomy today.  Comorbidities include atrial fibrillation.  Diabetes.  Pulmonary hypertension.  Right pleural effusion status post thoracentesis, moderate aortic stenosis, COPD, acute and chronic blood loss anemia, hypertension. He will need ICU postop and I told him that  Colonoscopy shows biopsy-proven adenocarcinoma in the region of the cecum.  CT scan shows a nonobstructing mass in this location but no signs of any metastatic disease.  Right pleural effusion was noted and underwent thoracentesis this weekend.  Lab work this morning shows hemoglobin stable at 8.7.  Glucose 125.  Creatinine being 1.25.  Potassium 4.1.  LFTs normal. Type and screen sent  Objective: Vital signs in last 24 hours: Temp:  [98.1 F (36.7 C)-98.5 F (36.9 C)] 98.1 F (36.7 C) (08/05 0433) Pulse Rate:  [63-66] 63 (08/05 0433) Resp:  [16-22] 18 (08/05 0433) BP: (125-138)/(59-89) 125/71 (08/05 0433) SpO2:  [95 %-99 %] 99 % (08/05 0433) Last BM Date: 09/03/17  Intake/Output from previous day: 08/04 0701 - 08/05 0700 In: -  Out: 1750 [Urine:1750] Intake/Output this shift: No intake/output data recorded.   EXAM: General appearance: Alert.  Cooperative.  No distress.  Sitting in chair.  Large body habitus.  Has good insight and understanding of the proposed procedure and its risks Resp: Reduced breath sounds bilaterally but no crackles or wheezes.  Not tachypneic.  Not wearing oxygen.  No use of accessory muscles. Cardiovascular.  Irregularly irregular.  Systolic murmur present.  Trace lower extremity edema. GI: Soft.  Nontender.  Protuberant abdomen.  Bowel sounds present.  Small umbilical  hernia.  Lab Results:  Recent Labs    09/04/17 0511 09/05/17 0504  WBC 3.3* 3.6*  HGB 8.7* 8.7*  HCT 27.9* 27.7*  PLT 176 173   BMET Recent Labs    09/04/17 0511 09/05/17 0504  NA 141 141  K 3.5 4.1  CL 103 103  CO2 29 30  GLUCOSE 121* 125*  BUN 15 15  CREATININE 1.18 1.25*  CALCIUM 9.0 9.2   PT/INR No results for input(s): LABPROT, INR in the last 72 hours. ABG No results for input(s): PHART, HCO3 in the last 72 hours.  Invalid input(s): PCO2, PO2  Studies/Results: Dg Chest 1 View  Result Date: 09/03/2017 CLINICAL DATA:  Status post right-sided thoracentesis EXAM: CHEST  1 VIEW COMPARISON:  09/01/2017 FINDINGS: Cardiac shadow is enlarged. Postsurgical changes are noted. No post thoracentesis pneumothorax is noted. Minimal residual right effusion is seen. Mild atelectatic changes are again noted in the left base. IMPRESSION: No pneumothorax following right thoracentesis. Electronically Signed   By: Inez Catalina M.D.   On: 09/03/2017 15:47   Dg Chest 2 View  Result Date: 09/04/2017 CLINICAL DATA:  Per order- SOB Hx of cancer, HTN, DM, CHF, AFIB, and COPD. EXAM: CHEST - 2 VIEW COMPARISON:  09/13/2017 FINDINGS: Median sternotomy. The heart is enlarged, stable in configuration. There is pulmonary vascular congestion and mild prominence of interstitial markings, consistent with mild edema. More focal opacity in the RIGHT mid lung zone raises question of early infiltrate. No pleural effusions. Degenerative changes are seen in thoracic spine. IMPRESSION: Cardiomegaly and mild edema. Question of early infiltrate in the RIGHT UPPER lobe. Followup PA and lateral chest X-ray is recommended  in 3-4 weeks following trial of antibiotic therapy to ensure resolution and exclude underlying malignancy. Electronically Signed   By: Nolon Nations M.D.   On: 09/04/2017 11:23   US Thoracentesis Asp Pleural Space W/img Guide  Result Date: 09/03/2017 INDICATION: Patient with history of shortness of  breath and right pleural effusion. Request is made for diagnostic and therapeutic right thoracentesis. EXAM: ULTRASOUND GUIDED DIAGNOSTIC AND THERAPEUTIC RIGHT THORACENTESIS MEDICATIONS: 10 mL of 1% lidocaine COMPLICATIONS: None immediate. PROCEDURE: An ultrasound guided thoracentesis was thoroughly discussed with the patient and questions answered. The benefits, risks, alternatives and complications were also discussed. The patient understands and wishes to proceed with the procedure. Written consent was obtained. Ultrasound was performed to localize and mark an adequate pocket of fluid in the right chest. The area was then prepped and draped in the normal sterile fashion. 1% Lidocaine was used for local anesthesia. Under ultrasound guidance a 6 Fr Safe-T-Centesis catheter was introduced. Thoracentesis was performed. The catheter was removed and a dressing applied. FINDINGS: A total of approximately 650 mL of clear gold fluid was removed. Samples were sent to the laboratory as requested by the clinical team. IMPRESSION: Successful ultrasound guided right thoracentesis yielding 650 mL of pleural fluid. Read by: Earley Abide, PA-C Electronically Signed   By: Jacqulynn Cadet M.D.   On: 09/03/2017 15:35    Anti-infectives: Anti-infectives (From admission, onward)   Start     Dose/Rate Route Frequency Ordered Stop   09/05/17 0748  clindamycin (CLEOCIN) IVPB 900 mg     900 mg 100 mL/hr over 30 Minutes Intravenous 60 min pre-op 09/05/17 0749     09/05/17 0748  gentamicin (GARAMYCIN) 540 mg in dextrose 5 % 100 mL IVPB     5 mg/kg  108.9 kg 113.5 mL/hr over 60 Minutes Intravenous 60 min pre-op 09/05/17 0749     08/31/17 1100  fluconazole (DIFLUCAN) tablet 100 mg     100 mg Oral Daily 08/31/17 0921 09/02/17 1726      Assessment/Plan:  Adenocarcinoma right colon.  Proceed with laparoscopic-assisted right colectomy, possible open colectomy today OR notified.  Plan surgery this morning May need  transfusion.  Type and screen sent ICU postop I have discussed the indications, details, techniques, and numerous risk of the surgery with him.  He is aware of the risk of bleeding, infection, anastomotic leak with reoperation and ostomy, injury to adjacent organs such as intestine or ureter or bladder with major reconstructive surgery, wound problems such as infection or dehiscence, cardiac pulmonary and thromboembolic problems.  He knows that his health may decline requiring skilled nursing facility.  He understands all these issues.  All his questions were answered.  He agrees with this plan.  Acute on chronic blood loss anemia chronic GI bleeding Right pleural effusion.  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  Moderate.  See echocardiogram and cardiology note COPD on home O2 Acute on chronic diastolic  Obstructive sleep apnea. Essential hypertension Persistent atrial fibrillation, currently on no anticoagulation.  Controlled with Cardizem History of bipolar affective disorder COPD Gout Recent right hip fracture CKD stage III   LOS: 6 days    Renelda Loma M Giovany Cosby 09/05/2017

## 2017-09-06 ENCOUNTER — Encounter (HOSPITAL_COMMUNITY): Payer: Self-pay | Admitting: General Surgery

## 2017-09-06 LAB — GLUCOSE, CAPILLARY
GLUCOSE-CAPILLARY: 130 mg/dL — AB (ref 70–99)
GLUCOSE-CAPILLARY: 160 mg/dL — AB (ref 70–99)
Glucose-Capillary: 106 mg/dL — ABNORMAL HIGH (ref 70–99)
Glucose-Capillary: 138 mg/dL — ABNORMAL HIGH (ref 70–99)

## 2017-09-06 LAB — COMPREHENSIVE METABOLIC PANEL
ALK PHOS: 53 U/L (ref 38–126)
ALT: 11 U/L (ref 0–44)
AST: 15 U/L (ref 15–41)
Albumin: 3.4 g/dL — ABNORMAL LOW (ref 3.5–5.0)
Anion gap: 10 (ref 5–15)
BUN: 18 mg/dL (ref 8–23)
CALCIUM: 8.9 mg/dL (ref 8.9–10.3)
CO2: 26 mmol/L (ref 22–32)
CREATININE: 1.61 mg/dL — AB (ref 0.61–1.24)
Chloride: 103 mmol/L (ref 98–111)
GFR, EST AFRICAN AMERICAN: 44 mL/min — AB (ref >60)
GFR, EST NON AFRICAN AMERICAN: 38 mL/min — AB (ref >60)
Glucose, Bld: 156 mg/dL — ABNORMAL HIGH (ref 70–99)
Potassium: 5 mmol/L (ref 3.5–5.1)
Sodium: 139 mmol/L (ref 135–145)
Total Bilirubin: 0.7 mg/dL (ref 0.3–1.2)
Total Protein: 6.7 g/dL (ref 6.5–8.1)

## 2017-09-06 LAB — CBC WITH DIFFERENTIAL/PLATELET
Basophils Absolute: 0 10*3/uL (ref 0.0–0.1)
Basophils Relative: 0 %
Eosinophils Absolute: 0 10*3/uL (ref 0.0–0.7)
Eosinophils Relative: 0 %
HCT: 28.7 % — ABNORMAL LOW (ref 39.0–52.0)
HEMOGLOBIN: 8.8 g/dL — AB (ref 13.0–17.0)
LYMPHS ABS: 0.4 10*3/uL — AB (ref 0.7–4.0)
LYMPHS PCT: 4 %
MCH: 29.1 pg (ref 26.0–34.0)
MCHC: 30.7 g/dL (ref 30.0–36.0)
MCV: 95 fL (ref 78.0–100.0)
Monocytes Absolute: 0.6 10*3/uL (ref 0.1–1.0)
Monocytes Relative: 7 %
NEUTROS ABS: 8.2 10*3/uL — AB (ref 1.7–7.7)
NEUTROS PCT: 89 %
Platelets: 202 10*3/uL (ref 150–400)
RBC: 3.02 MIL/uL — AB (ref 4.22–5.81)
RDW: 15.9 % — ABNORMAL HIGH (ref 11.5–15.5)
WBC: 9.2 10*3/uL (ref 4.0–10.5)

## 2017-09-06 LAB — MAGNESIUM: MAGNESIUM: 2 mg/dL (ref 1.7–2.4)

## 2017-09-06 LAB — PHOSPHORUS: PHOSPHORUS: 4.5 mg/dL (ref 2.5–4.6)

## 2017-09-06 MED ORDER — ORAL CARE MOUTH RINSE
15.0000 mL | Freq: Two times a day (BID) | OROMUCOSAL | Status: DC
  Administered 2017-09-06 – 2017-09-13 (×8): 15 mL via OROMUCOSAL

## 2017-09-06 NOTE — Progress Notes (Signed)
1 Day Post-Op  Subjective: Arousable, alert.  Appropriate.  Denies nausea.  Denies dyspnea. Operative events explained to patient Used BiPAP successfully last night.  Currently SPO2 95% on 2-1/2 L. Urine output reasonable. Hemoglobin stable at 8.8.  Creatinine 1.61 slightly above baseline.  Potassium 5.0.  Glucose 156   Objective: Vital signs in last 24 hours: Temp:  [97.4 F (36.3 C)-98.6 F (37 C)] 97.7 F (36.5 C) (08/06 0333) Pulse Rate:  [71-99] 99 (08/05 2113) Resp:  [10-21] 15 (08/06 0300) BP: (105-157)/(48-89) 106/48 (08/06 0300) SpO2:  [93 %-100 %] 95 % (08/06 0300) Weight:  [104.7 kg (230 lb 13.2 oz)] 104.7 kg (230 lb 13.2 oz) (08/05 1324) Last BM Date: 09/03/17  Intake/Output from previous day: 08/05 0701 - 08/06 0700 In: 2489.6 [P.O.:240; I.V.:1938.3; IV Piggyback:311.3] Out: 390 [Urine:340; Blood:50] Intake/Output this shift: Total I/O In: 754.9 [I.V.:754.9] Out: -   General appearance: Sleeping but easily aroused and appropriate.  Minimal distress. Resp: Clear anteriorly without wheeze or rhonchi GI: Soft.  Somewhat distended and tympanitic.  Bowel sounds present.  Wounds look fine.  Lab Results:  Results for orders placed or performed during the hospital encounter of 08/30/17 (from the past 24 hour(s))  Type and screen [place]     Status: None   Collection Time: 09/05/17  6:42 AM  Result Value Ref Range   ABO/RH(D) O POS    Antibody Screen NEG    Sample Expiration      09/08/2017 Performed at [facility], [place] [address] [place], [place] [address]   Glucose, capillary     Status: Abnormal   Collection Time: 09/05/17  7:35 AM  Result Value Ref Range   Glucose-Capillary 130 (H) 70 - 99 mg/dL  Hemoglobin A1c     Status: Abnormal   Collection Time: 09/05/17  8:00 AM  Result Value Ref Range   Hgb A1c MFr Bld 6.1 (H) 4.8 - 5.6 %   Mean Plasma Glucose 128.37 mg/dL  Surgical pcr screen     Status: None   Collection Time: 09/05/17  8:26 AM  Result Value Ref Range   MRSA, PCR NEGATIVE NEGATIVE   Staphylococcus aureus NEGATIVE NEGATIVE  Glucose, capillary     Status: Abnormal   Collection Time: 09/05/17 11:59 AM  Result Value Ref Range   Glucose-Capillary 192 (H) 70 - 99 mg/dL  CBC     Status: Abnormal   Collection Time: 09/05/17 12:35 PM  Result Value Ref Range   WBC 5.8 4.0 - 10.5 K/uL   RBC 3.04 (L) 4.22 - 5.81 MIL/uL   Hemoglobin 9.0 (L) 13.0 - 17.0 g/dL   HCT 28.5 (L) 39.0 - 52.0 %   MCV 93.8 78.0 - 100.0 fL   MCH 29.6 26.0 - 34.0 pg   MCHC 31.6 30.0 - 36.0 g/dL   RDW 15.7 (H) 11.5 - 15.5 %   Platelets 191 150 - 400 K/uL  Basic metabolic panel     Status: Abnormal   Collection Time: 09/05/17 12:35 PM  Result Value Ref Range   Sodium 138 135 - 145 mmol/L   Potassium 4.4 3.5 - 5.1 mmol/L   Chloride 103 98 - 111 mmol/L   CO2 26 22 - 32 mmol/L   Glucose, Bld 196 (H) 70 - 99 mg/dL   BUN 15 8 - 23 mg/dL   Creatinine, Ser 1.16 0.61 - 1.24 mg/dL   Calcium 8.9 8.9 - 10.3 mg/dL   GFR calc non Af Wyvonnia Lora  57 (L) >60 mL/min   GFR calc Af Amer >60 >60 mL/min   Anion gap 9 5 - 15  CBC     Status: Abnormal   Collection Time: 09/05/17  1:33 PM  Result Value Ref Range   WBC 6.2 4.0 - 10.5 K/uL   RBC 3.05 (L) 4.22 - 5.81 MIL/uL   Hemoglobin 9.0 (L) 13.0 - 17.0 g/dL   HCT 28.7 (L) 39.0 - 52.0 %   MCV 94.1 78.0 - 100.0 fL   MCH 29.5 26.0 - 34.0 pg   MCHC 31.4 30.0 - 36.0 g/dL   RDW 15.7 (H) 11.5 - 15.5 %   Platelets 194 150 - 400 K/uL  Creatinine, serum     Status: Abnormal   Collection Time: 09/05/17  1:33 PM  Result Value Ref Range   Creatinine, Ser 1.32 (H) 0.61 - 1.24 mg/dL   GFR calc non Af Amer 49 (L) >60 mL/min   GFR calc Af Amer 56 (L) >60 mL/min  Glucose, capillary     Status: Abnormal   Collection Time: 09/05/17  4:33 PM  Result Value Ref Range   Glucose-Capillary 169 (H) 70 - 99 mg/dL  Glucose, capillary     Status: Abnormal   Collection Time: 09/05/17  9:23 PM  Result  Value Ref Range   Glucose-Capillary 157 (H) 70 - 99 mg/dL  CBC with Differential/Platelet     Status: Abnormal   Collection Time: 09/06/17  3:18 AM  Result Value Ref Range   WBC 9.2 4.0 - 10.5 K/uL   RBC 3.02 (L) 4.22 - 5.81 MIL/uL   Hemoglobin 8.8 (L) 13.0 - 17.0 g/dL   HCT 28.7 (L) 39.0 - 52.0 %   MCV 95.0 78.0 - 100.0 fL   MCH 29.1 26.0 - 34.0 pg   MCHC 30.7 30.0 - 36.0 g/dL   RDW 15.9 (H) 11.5 - 15.5 %   Platelets 202 150 - 400 K/uL   Neutrophils Relative % 89 %   Neutro Abs 8.2 (H) 1.7 - 7.7 K/uL   Lymphocytes Relative 4 %   Lymphs Abs 0.4 (L) 0.7 - 4.0 K/uL   Monocytes Relative 7 %   Monocytes Absolute 0.6 0.1 - 1.0 K/uL   Eosinophils Relative 0 %   Eosinophils Absolute 0.0 0.0 - 0.7 K/uL   Basophils Relative 0 %   Basophils Absolute 0.0 0.0 - 0.1 K/uL  Comprehensive metabolic panel     Status: Abnormal   Collection Time: 09/06/17  3:18 AM  Result Value Ref Range   Sodium 139 135 - 145 mmol/L   Potassium 5.0 3.5 - 5.1 mmol/L   Chloride 103 98 - 111 mmol/L   CO2 26 22 - 32 mmol/L   Glucose, Bld 156 (H) 70 - 99 mg/dL   BUN 18 8 - 23 mg/dL   Creatinine, Ser 1.61 (H) 0.61 - 1.24 mg/dL   Calcium 8.9 8.9 - 10.3 mg/dL   Total Protein 6.7 6.5 - 8.1 g/dL   Albumin 3.4 (L) 3.5 - 5.0 g/dL   AST 15 15 - 41 U/L   ALT 11 0 - 44 U/L   Alkaline Phosphatase 53 38 - 126 U/L   Total Bilirubin 0.7 0.3 - 1.2 mg/dL   GFR calc non Af Amer 38 (L) >60 mL/min   GFR calc Af Amer 44 (L) >60 mL/min   Anion gap 10 5 - 15  Magnesium     Status: None   Collection Time: 09/06/17  3:18 AM  Result Value Ref Range   Magnesium 2.0 1.7 - 2.4 mg/dL  Phosphorus     Status: None   Collection Time: 09/06/17  3:18 AM  Result Value Ref Range   Phosphorus 4.5 2.5 - 4.6 mg/dL     Studies/Results: No results found.  Marland Kitchen alvimopan  12 mg Oral BID  . buPROPion  150 mg Oral Daily  . citalopram  20 mg Oral Daily  . diltiazem  120 mg Oral Daily  . enoxaparin (LOVENOX) injection  40 mg Subcutaneous Q24H   . feeding supplement  237 mL Oral BID BM  . furosemide  80 mg Oral Daily  . guaiFENesin  1,200 mg Oral BID  . insulin aspart  0-15 Units Subcutaneous TID WC  . insulin aspart  0-5 Units Subcutaneous QHS  . ipratropium  0.5 mg Nebulization BID  . iron polysaccharides  150 mg Oral BID  . levalbuterol  0.63 mg Nebulization BID  . mouth rinse  15 mL Mouth Rinse BID  . pantoprazole  40 mg Oral BID  . potassium chloride SA  20 mEq Oral Daily  . pravastatin  20 mg Oral q1800  . saccharomyces boulardii  250 mg Oral BID  . temazepam  15 mg Oral QHS     Assessment/Plan: s/p Procedure(s): LAPAROSCOPIC RIGHT COLON RESECTION  POD #1.  Laparoscopic right colectomy for cancer Stable Keep in ICU 1 more day Sips clear liquids Up to chair Foley out tomorrow Check pathology  Atrial fibrillation-controlled at rate of 90.  On Cardizem.  Cardiology following Pulmonary hypertension COPD OSA on BiPAP.-Continue BiPAP per respiratory therapy Moderate aortic stenosis Pleural effusion-status post thoracentesis preop.  Chest x-ray tomorrow Acute on chronic blood loss anemia.-No evidence of active bleeding Hypertension.  BP seems controlled.  Medication regimen per cardiology and [facility] hospitalist    @PROBHOSP @  LOS: 7 days    Adin Hector 09/06/2017  . .prob

## 2017-09-06 NOTE — Evaluation (Signed)
Occupational Therapy Evaluation Patient Details Name: Joel [name] MRN: [id number] DOB: 06/24/1932 Today's Date: 09/06/2017    History of Present Illness This 84 y.o. male is s/p laparoscopic R colectomy and repair of umbilical due to colon CA.  PMH significant for CAD, Afib not on anticoagulation due to history of recurrent GI bleed due to suspected small gastric AVMs causing iron deficiency anemia. Surgical history of R hip cannulated pinning in 03/2017, R shoulder surgery in 2011/2012, and electrotherapy for depression in 1969.    Clinical Impression   Pt was admitted for the above.  He recently returned home from rehab and was mostly mod I with adls and used a w/c.  Pt will benefit from continued OT in acute to regain strength endurance for adls/bathroom transfers. He plans to have someone stay with him initially and daughter also nearby.  Will follow in acute setting with the goals below    Follow Up Recommendations  Supervision/Assistance - 24 hour    Equipment Recommendations  None recommended by OT    Recommendations for Other Services       Precautions / Restrictions Precautions Precautions: Fall Precaution Comments: abd sx Restrictions Weight Bearing Restrictions: No      Mobility Bed Mobility Overal bed mobility: Needs Assistance Bed Mobility: Supine to Sit     Supine to sit: Min assist;Mod assist;HOB elevated     General bed mobility comments: HOB raised completely and pt used bedrails on bil sides  Transfers   Equipment used: Rolling walker (2 wheeled)   Sit to Stand: Min assist;+2 safety/equipment Stand pivot transfers: Min assist;+2 safety/equipment       General transfer comment: assist to rise and steady    Balance                                           ADL either performed or assessed with clinical judgement   ADL Overall ADL's : Needs assistance/impaired             Lower Body Bathing: Moderate assistance;Sit  to/from stand       Lower Body Dressing: Maximal assistance;Sit to/from stand   Toilet Transfer: Minimal assistance;+2 for safety/equipment;Stand-pivot;RW(to chair)             General ADL Comments: Pt can complete UB adls with set up from sitting.  He will have someone stay with him 24/7 who can assist with adls as needed.     Vision         Perception     Praxis      Pertinent Vitals/Pain Pain Assessment: 0-10 Pain Score: 8  Pain Location: abdomen Pain Descriptors / Indicators: Aching;Sore Pain Intervention(s): Limited activity within patient's tolerance;Monitored during session;Premedicated before session;Repositioned     Hand Dominance     Extremity/Trunk Assessment Upper Extremity Assessment Upper Extremity Assessment: Overall WFL for tasks assessed           Communication Communication Communication: No difficulties   Cognition Arousal/Alertness: Awake/alert Behavior During Therapy: WFL for tasks assessed/performed Overall Cognitive Status: Within Functional Limits for tasks assessed                                     General Comments  supine BP 124/54. Sitting in chair at end of session 119/90    Exercises  Shoulder Instructions      Home Living Family/patient expects to be discharged to:: Private residence Living Arrangements: Alone Available Help at Discharge: Family;Friend(s);Available 24 hours/day Type of Home: House Home Access: Ramped entrance     Home Layout: Able to live on main level with bedroom/bathroom     Bathroom Shower/Tub: Occupational psychologist: Handicapped height     Home Equipment: Wheelchair - [employer] - 2 wheels;Toilet riser;Cane - quad   Additional Comments: uses wheelchair for all home and community ambulation, uses environment (countertops, Network engineer) to navigate bathroom/bedroom       Prior Functioning/Environment Level of Independence: Independent with assistive device(s)         Comments: pt was in rehab x 6 weeks after hip sx. Uses w/c         OT Problem List: Decreased strength;Decreased activity tolerance;Pain      OT Treatment/Interventions: Self-care/ADL training;DME and/or AE instruction;Patient/family education;Balance training;Therapeutic activities    OT Goals(Current goals can be found in the care plan section) Acute Rehab OT Goals Patient Stated Goal: return to independence; home OT Goal Formulation: With patient Time For Goal Achievement: 09/20/17 Potential to Achieve Goals: Good ADL Goals Pt Will Perform Grooming: with supervision;standing Pt Will Transfer to Toilet: with min guard assist;ambulating;bedside commode Additional ADL Goal #1: pt will perform bed mobility from flat bed at min A level in preparation for adls  OT Frequency: Min 2X/week   Barriers to D/C:            Co-evaluation PT/OT/SLP Co-Evaluation/Treatment: Yes Reason for Co-Treatment: For patient/therapist safety PT goals addressed during session: Mobility/safety with mobility OT goals addressed during session: ADL's and self-care      AM-PAC PT "6 Clicks" Daily Activity     Outcome Measure Help from another person eating meals?: None Help from another person taking care of personal grooming?: A Little Help from another person toileting, which includes using toliet, bedpan, or urinal?: A Lot Help from another person bathing (including washing, rinsing, drying)?: A Lot Help from another person to put on and taking off regular upper body clothing?: A Little Help from another person to put on and taking off regular lower body clothing?: A Lot 6 Click Score: 16   End of Session Nurse Communication: Mobility status  Activity Tolerance: Patient tolerated treatment well Patient left: in chair;with call bell/phone within reach;with chair alarm set  OT Visit Diagnosis: Muscle weakness (generalized) (M62.81);Unsteadiness on feet (R26.81)                Time:  2563-8937 OT Time Calculation (min): 23 min Charges:  OT General Charges $OT Visit: 1 Visit OT Evaluation $OT Eval Low Complexity: Joel [name], OTR/L [phone] 09/06/2017  Joel [name] 09/06/2017, 11:57 AM

## 2017-09-06 NOTE — Progress Notes (Signed)
Pt. ready to be placed on BIPAP DS for h/s, added 2.5 lpm oxygen into circuit, humidifier refilled with s/w, L/FFM, Pt. tolerating well, RN made aware.

## 2017-09-06 NOTE — Evaluation (Signed)
Physical Therapy RE-Evaluation Patient Details Name: Joel [name] MRN: [id number] DOB: 06-04-1932 Today's Date: 09/06/2017   History of Present Illness  This 84 y.o. male is s/p laparoscopic R colectomy and repair of umbilical due to colon CA.  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 Surgical history of R hip cannulated pinning in 03/2017, R shoulder surgery in 2011/2012, and electrotherapy for depression in 1969.   Clinical Impression  The patient mobilized  To sitting and transferred to recliner with min assist and 2 for safety. The patient reports that he  Uses a WC mostly. Patient is not interested in SNF, reports having caregivers available PRN. Pt admitted with above diagnosis. Pt currently with functional limitations due to the deficits listed below (see PT Problem List). Pt will benefit from skilled PT to increase their independence and safety with mobility to allow discharge to the venue listed below.     Follow Up Recommendations Home health PT  Equipment Recommendations  None recommended by PT    Recommendations for Other Services       Precautions / Restrictions Precautions Precautions: Fall Precaution Comments: abd sx Restrictions Weight Bearing Restrictions: No      Mobility  Bed Mobility Overal bed mobility: Needs Assistance Bed Mobility: Supine to Sit     Supine to sit: Min assist;Mod assist;HOB elevated     General bed mobility comments: HOB raised completely and pt used bedrails on bil sides  Transfers   Equipment used: Rolling walker (2 wheeled)   Sit to Stand: Min assist;+2 safety/equipment Stand pivot transfers: Min assist;+2 safety/equipment       General transfer comment: assist to rise and steady  Ambulation/Gait                Stairs            Wheelchair Mobility    Modified Rankin (Stroke Patients Only)       Balance                                              Pertinent Vitals/Pain Pain Assessment: 0-10 Pain Score: 8  Pain Location: abdomen Pain Descriptors / Indicators: Aching;Sore Pain Intervention(s): Monitored during session;Premedicated before session;Limited activity within patient's tolerance    Home Living Family/patient expects to be discharged to:: Private residence Living Arrangements: Alone Available Help at Discharge: Family;Friend(s);Available 24 hours/day Type of Home: House Home Access: Ramped entrance     Home Layout: Able to live on main level with bedroom/bathroom Home Equipment: Wheelchair - [employer] - 2 wheels;Toilet riser;Cane - quad Additional Comments: uses wheelchair for all home and community ambulation, uses environment (countertops, Network engineer) to navigate bathroom/bedroom     Prior Function Level of Independence: Independent with assistive device(s)  drives        Comments: pt was in rehab x 6 weeks after hip sx. Uses w/c , has a man who assists at home PRN and can stay with pt. per pt.     Hand Dominance        Extremity/Trunk Assessment   Upper Extremity Assessment Upper Extremity Assessment: Overall WFL for tasks assessed            Communication   Communication: No difficulties  Cognition Arousal/Alertness: Awake/alert Behavior During Therapy: WFL for tasks assessed/performed Overall Cognitive Status: Within Functional Limits for  tasks assessed                                        General Comments General comments (skin integrity, edema, etc.): supine BP 124/54. Sitting in chair at end of session 119/90    Exercises     Assessment/Plan    PT Assessment    PT Problem List         PT Treatment Interventions DME instruction;Balance training;Functional mobility training;Patient/family education;Therapeutic activities;Therapeutic exercise;Wheelchair mobility training    PT Goals (Current goals can be found in the Care Plan section)  Acute  Rehab PT Goals Patient Stated Goal: return to independence; home PT Goal Formulation: With patient Time For Goal Achievement: 09/20/17 Potential to Achieve Goals: Good    Frequency Min 3X/week   Barriers to discharge        Co-evaluation PT/OT/SLP Co-Evaluation/Treatment: Yes Reason for Co-Treatment: For patient/therapist safety PT goals addressed during session: Mobility/safety with mobility OT goals addressed during session: ADL's and self-care       AM-PAC PT "6 Clicks" Daily Activity  Outcome Measure Difficulty turning over in bed (including adjusting bedclothes, sheets and blankets)?: A Lot Difficulty moving from lying on back to sitting on the side of the bed? : A Lot Difficulty sitting down on and standing up from a chair with arms (e.g., wheelchair, bedside commode, etc,.)?: A Lot Help needed moving to and from a bed to chair (including a wheelchair)?: A Lot Help needed walking in hospital room?: Total Help needed climbing 3-5 steps with a railing? : Total 6 Click Score: 10    End of Session Equipment Utilized During Treatment: Gait belt     Nurse Communication: Mobility status PT Visit Diagnosis: Unsteadiness on feet (R26.81);Other abnormalities of gait and mobility (R26.89)    Time: 5885-0277 PT Time Calculation (min) (ACUTE ONLY): 25 min   Charges:   PT Evaluation $PT Re-evaluation: 1 Re-eval          Tresa Endo PT [phone]   Claretha Cooper 09/06/2017, 2:03 PM

## 2017-09-06 NOTE — Progress Notes (Addendum)
Progress Note  Patient Name: Joel [name] Date of Encounter: 09/06/2017  Primary Cardiologist: No primary care provider on file.   Subjective   Remains rate controlled in afib. Denies any chest pain or SOB.  Inpatient Medications    Scheduled Meds: . alvimopan  12 mg Oral BID  . buPROPion  150 mg Oral Daily  . citalopram  20 mg Oral Daily  . diltiazem  120 mg Oral Daily  . enoxaparin (LOVENOX) injection  40 mg Subcutaneous Q24H  . feeding supplement  237 mL Oral BID BM  . furosemide  80 mg Oral Daily  . guaiFENesin  1,200 mg Oral BID  . insulin aspart  0-15 Units Subcutaneous TID WC  . insulin aspart  0-5 Units Subcutaneous QHS  . ipratropium  0.5 mg Nebulization BID  . iron polysaccharides  150 mg Oral BID  . levalbuterol  0.63 mg Nebulization BID  . mouth rinse  15 mL Mouth Rinse BID  . pantoprazole  40 mg Oral BID  . potassium chloride SA  20 mEq Oral Daily  . pravastatin  20 mg Oral q1800  . saccharomyces boulardii  250 mg Oral BID  . temazepam  15 mg Oral QHS   Continuous Infusions: . lactated ringers 100 mL/hr at 09/06/17 1000   PRN Meds: [DISCONTINUED] acetaminophen **OR** acetaminophen, fluticasone, HYDROcodone-acetaminophen, HYDROmorphone (DILAUDID) injection, [COMPLETED] ketorolac **FOLLOWED BY** ketorolac, meclizine, ondansetron **OR** ondansetron (ZOFRAN) IV   Vital Signs    Vitals:   09/06/17 0800 09/06/17 0824 09/06/17 0900 09/06/17 1000  BP:   101/61 (!) 101/53  Pulse:      Resp:   10 17  Temp: 99.2 F (37.3 C)     TempSrc: Oral     SpO2:  94% 97% 96%  Weight:      Height:        Intake/Output Summary (Last 24 hours) at 09/06/2017 1123 Last data filed at 09/06/2017 1000 Gross per 24 hour  Intake 3285.83 ml  Output 290 ml  Net 2995.83 ml   Filed Weights   08/30/17 1724 08/30/17 2240 09/05/17 1324  Weight: 240 lb (108.9 kg) 240 lb (108.9 kg) 230 lb 13.2 oz (104.7 kg)    Telemetry    Atrial fibrillation with CVR - Personally  Reviewed  ECG    No new EKG to review - Personally Reviewed  Physical Exam   GEN: No acute distress.   Neck: No JVD Cardiac: irregularly irregular, no  rubs, or gallops. 2/6 SM at RUSB to LLSB Respiratory: Clear to auscultation bilaterally. GI: Soft, nontender, non-distended  MS: No edema; No deformity. Neuro:  Nonfocal  Psych: Normal affect   Labs    Chemistry Recent Labs  Lab 09/04/17 0511 09/05/17 0504 09/05/17 1235 09/05/17 1333 09/06/17 0318  NA 141 141 138  --  139  K 3.5 4.1 4.4  --  5.0  CL 103 103 103  --  103  CO2 29 30 26   --  26  GLUCOSE 121* 125* 196*  --  156*  BUN 15 15 15   --  18  CREATININE 1.18 1.25* 1.16 1.32* 1.61*  CALCIUM 9.0 9.2 8.9  --  8.9  PROT 6.5 6.7  --   --  6.7  ALBUMIN 3.5 3.6  --   --  3.4*  AST 15 22  --   --  15  ALT 10 10  --   --  11  ALKPHOS 53 57  --   --  73  BILITOT 0.5 0.6  --   --  0.7  GFRNONAA 56* 52* 57* 49* 38*  GFRAA >60 >60 >60 56* 44*  ANIONGAP 9 8 9   --  10     Hematology Recent Labs  Lab 09/05/17 1235 09/05/17 1333 09/06/17 0318  WBC 5.8 6.2 9.2  RBC 3.04* 3.05* 3.02*  HGB 9.0* 9.0* 8.8*  HCT 28.5* 28.7* 28.7*  MCV 93.8 94.1 95.0  MCH 29.6 29.5 29.1  MCHC 31.6 31.4 30.7  RDW 15.7* 15.7* 15.9*  PLT 191 194 202    Cardiac EnzymesNo results for input(s): TROPONINI in the last 168 hours. No results for input(s): TROPIPOC in the last 168 hours.   BNP Recent Labs  Lab 09/01/17 0037  BNP 237.1*     DDimer No results for input(s): DDIMER in the last 168 hours.   Radiology    No results found.  Cardiac Studies   2D echocardiogram 09/01/2017 Study Conclusions  - Left ventricle: Wall thickness was increased in a pattern of moderate LVH. Systolic function was normal. The estimated ejection fraction was in the range of 55% to 60%. The study is not technically sufficient to allow evaluation of LV diastolic function. - Aortic valve: There was moderate stenosis. Valve area (VTI):  1.57 cm^2. Valve area (Vmax): 1.74 cm^2. Valve area (Vmean): 1.53 cm^2. - Mitral valve: Moderately calcified annulus. Mildly thickened, mildly calcified leaflets . There was mild regurgitation. Valve area by continuity equation (using LVOT flow): 2.59 cm^2. - Left atrium: The atrium was moderately dilated. - Atrial septum: No defect or patent foramen ovale was identified. - Pulmonary arteries: PA peak pressure: 65 mm Hg (S). - Impressions: Gradients have increased since 2017 when they were mean 16 mmHg and peak 26 mmHg.  Impressions:  - Gradients have increased since 2017 when they were mean 16 mmHg and peak 26 mmHg.    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 Long history of GI bleeding and anemia with AVM's as well. Chronic afib with anticoagulation held multiple times in past due to bleeding. Also COPD on home oxygen. 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 He has had recent fall and can barely ambulate. Currently no chest pain mild dyspnea and no palpitations. Afib rate control is fine Hct is low at 28  Assessment & Plan    1.  Permanent atrial fibrillation -He is well controlled on telemetry with HR in the 80's -CHADS2VASC score is 4 -continue home dose of Cardizem CD 120 mg daily  -He has not been on anticoagulation recently because of problems with bleeding. -Would start on Eliquis 5 mg twice daily once felt to be stable from a surgical standpoint  2.  Moderate aortic stenosis by recent echo with mean gradient 72mmHg -Will need to continue outpatient monitoring with yearly echo  3.  HTN -BP controlled post op at 101/27mmHg -continue Cardizem CD 120 mg daily -Losartan on hold due to soft BP  4.  OSA on BiPAP -Cntinue BiPAP therapy per respiratory  5. Colon CA -Status post laparoscopic  right colectomy -Per surgery   [facility] will sign off.   Medication Recommendations:  Cardizem CD 120mg  daily.  Add apixaban 5mg  BID when ok with surgery.  Restart Losartan once BP more stable. Other recommendations (labs, testing, etc):  none Follow up as an outpatient:  Dr. Minus Breeding or extender in 2 weeks  For questions or updates, please contact [facility] Please consult [email or web address] for contact info under Cardiology/STEMI.      Signed, Fransico Him, MD  09/06/2017, 11:23 AM

## 2017-09-06 NOTE — Progress Notes (Signed)
PROGRESS NOTE    Joel DRAHEIM  XIP:[id number] DOB: March 01, 1932 DOA: 08/30/2017 PCP: Wenda Low, MD   Brief Narrative: Joel [name] is a 84 y.o. male with medical history significant for CAD, Afib not on anticoagulation due to history of recurrent GI bleed felt to be from small gastric AVMs causing iron deficiency anemia. He required several transfusions last year, but has since been doing well. He has been following up with Hematology but has not followed up there since March 2019 it seems. The plan was for him to follow up monthlly with transfusions to keep Hb above 9. He has been doing well, but in the last 2-3 weeks he noted some abdominal bloating as well as weaness. Yesterday he want for a bone density scan and asked that his hemoglobin be checked, he was called to come to ER today due to Hb of 7. Denies nausea, abdominal pain, reflux, or BRBPR. He has very dark stools but he takes iron BID.  He has had some SOB with exertion, but no chest pain, dizziness or syncope.  In the ER repeat lab confirmed Hb 7, two units of blood have been ordered and Hemoccult was positive for blood. Hospitalist was asked to admit, and a call has been placed to GI for inpatient consultation. Patient underwent EGD which showed Gastritis, Duodenitis, and Esophageal Candidiasis. Repeat Hb improved to 9.0 but dropped again so GI did colonoscopy which showed a Cecal Mass so General Surgery was consulted.   Patient complained of Dyspnea so was given IV lasix on 09/01/17 and started on Breathing treatments with improvement ECHOCardiogram being checked. He appeared to be volume overloaded with a mild Diastolic CHF Exacerbation so lasix was increased. Cardiology was consulted for preoperative clearance. Patient underwent surgical resection of adenocarcinoma of the ascending colon laparoscopically and also had primary repair of umbilical hernia. Post-Operatively he was taken to the ICU and is POD 1 and doing well.    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  Localized mild inflammation characterized by erosions and erythema was found in the duodenal bulb -Continue to Monitor for S/Sx of Bleeding -C/w Iron Polysaccharides 150 mg po BID -Protonix 40 mg po Daily increased to 40 mg po BID by Gastroenterology  -Stopped Monitoring Hb/Hct q8h -GI took the patient for colonoscopy as his Hb/Hct was dropping and because he has not had a colonoscopy in 5 years. -Colonoscopy revealed an ulcerated, likely malignant non-obstructing medium-sized mass in the cecum and a 6 mm polyp in the rectum that was sessile along with sigmoid colon diverticula -GI feels cecal mass is strongly suggestive of cancer and likely accounts for chronic blood loss; Pathology Confirms Adenocarcinoma -General Surgery took the patient for Right Colectomy 09/05/17 and then he was taken to the ICU  -  Continue to Monitor and repeat CBC in AM   DMType2 complicated by Diabetic Neuropathy in feet -HbA1c was 6.0  -Continue to Monitor CBG's -Hold Metformin 1000 mg po BID and Glimepiride 1 mg po Daily with Breakfast  -C/w Gabapentin  -Continued  Moderate Novolog SSI q4h while NPO for possible GI  Intervention but will change to Moderate AC/HS now that patient is on a Heart Healthy/Carb Modified Diet  -CBG's ranging from 106-169; Continue to Monitor CBG's closely   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 -Check CT Chest/Abd/Pelvis with Contrast to evaluate for Mets  -GI consulted General Surgery who is to evaluate and recommending likely resection and would plan to proceed early next week -Per Surgery will need to remain on Clear Liquid Diet throughouth the weekend -Surgery requesting Cardiology to evaluate him for Pre-Surgical Risk Stratification  -Patient underwent Laparscopic Surgical Right Colectomy and Umbilical Hernia Repair and is POD1 -General Surgery recommending Sips of Clear Liquids and Up to [place] -Recommending Removal of Foley in AM   Right Pleural Effusion -CT Scan showed Moderate Fluid -Obtain Thoracentesis; Done and obtained 650 mL of Clear Gold Fluid  -C/w Diuresis and increased to 80 mg po Daily and given 1 dose of IV Lasix yesterday. Continue po Lasix 80 mg  pHTN -Increased Lasix 40 mg Daily to 80 mg po Daily (Home Dose);  -Given IV Lasix 09/03/17 -C/w Diuresis with Home Dose as he appears Euvolemic now   HTN -C/w Diltiazem 120 mg po Daily, Hold Losartan 25 mg po Daily for now currently that he got diuresed and will get IV Contrast along with Surgery -Diltiazem held due to Surgery but resumed by Cardiology   Chronic GIB -As Above; Likely from Cecal Mass   Persistent Atrial Fibrillation  -s/p Multiple DCCV -C/w Diltiazem 120 mg po Daily -CHADS2VASc Elevated of 4 but not on Anticoagulation due chronic GIB but now cardiology recommending starting the patient on Eliquis 5 mg twice daily once felt that he is stable from a surgical standpoint -Per Cardiology should have ben on Baby ASA but stopped.  -Cardiology Consulted for Pre-Operative Clearance; recommending continuing Cardizem for Rate control and states will need to be changed to IV post-op  however Dr. Radford Pax recommending po Cardizem when patient is able to tolerate po and IV Cardizem gtt in the interm if HR increases   Aortic Stenosis  -Was Moderate on last ECHO at Cardiology office -Per Cardiology monitor clinically and follow with repeat [facility] though he is a poor candidate in the future even probably for TAVR -Repeat ECHO this Visit as below  -Cardiology Consulted for Pre-Op Clearance and patient is a High Risk  -Cardiology recommending yearly [facility]  COPD on Home O2 -C/w 2 liters of O2 via [place]; Was not wearing it so desaturated earlier this admission; Now back on O2 -May have a mild Exacerbation because he felt dyspneic so will start patient on Xopenex/Atrovent q6h and place on Guaifenesin 1200 mg po BID  -C/w Dextromethorphan-Guaifenesin 30-600 mg po q12h -Had some wheezing post-operatively   Bipolar Affective Disorder -C/w Citalopram but to 20 mg po Daily and Bupropion 150 mg po Daily  -C/w Temazepam 15 mg po qHS  OSA -Ordered CPAP -Was placed on BiPAP 16/12 with 2 Liters of O2 Humidified overnight   Esophageal Candidiasis -Noted on EGD with plaques consistent with Candidiasis -Started on Fluconazole 100 mg po TID x3 days per GI and is to stopped 09/02/17  HLD/Dyslipidemia  -C/w Lovastatin 20 mg po Daily qHS  Gout -C/w Home Allopurinol 100 mg po Daily   Recent Right Hip Fracture -PT/OT to Evaluate and Treat -X-Ray on 06/07/17 showed: "Standing AP pelvis and frog-leg lateral x-ray demonstrates cannulated screw fixation femoral neck fracture with slight backout of cannulated screws unchanged from March 2019. No evidence of AVN and no displacement with interval healing of the femoral neck fracture.  Impression: Satisfactory cannulated screw fixation femoral neck fracture  with good position." -Dr Lorin Mercy recommended Ambulation and LE Strengthening   Mild AKI on CKD Stage 3 -Cr appears to be elevated since the beginning of the year -Maybe new Baseline; BUN/Cr  went from 15/1.25 -> 18/1.61 -BUN/Cr stable compared to prior values this year -Avoid Nephrotoxic Medications if possible and monitor closely given that patient is to receive contrast dye for CT Scans; -Possibly worsened in the setting of Surgery; Losartan has not been yet resumed -Continue to Monitor and Repeat CMP in AM   BPPV -C/w Home Meclizine 25 mg po TIDprn  Dyspnea, significantly improved  -Likely Multifactorial and in the setting of COPD, Anemia and mild volume overload with Acute on Chronic Diastolic CHF, and Right Plueral Effusion -Bronchodilators as above -Check ECHOCardiogram and Chest X-Ray -ECHOCardiogram showed the estimated  ejection fraction was in the range of 55% to 60%. The study is not technically sufficient to allow evaluation of LV diastolic function. -CXR showed Cardiomegaly and mild pulmonary edema. -Lasix as above -Flutter Valve, Incentive Spirometry, and Guaifenesin -Obtained Right Thoracentesis  -Continue to Monitor Respiratory Status Carefully; Currently on 2.5 Liters of O2 post-operatively   Acute on Chronic Diastolic CHF, mild Exacerbation but now improved -Had 1+ LE Edema and crackles earlier this Admission  -Increased po Lasix to 80 mg Daily -Gave IV Lasix as well.  -Strict I's/O's, Daily Weights; Patient is now -28.878 Liters  -Cardiology Consulted for Pre-Op Clearance and patient is high risk -Continue to Monitor Volume Status   DVT prophylaxis: SCDs given concern for Blood Loss Anemia Code Status: FULL CODE Family Communication: Discussed with Grandchildren at Bedside  Disposition Plan: Pending Further workup and evaluation by General Surgery; Remain In ICU per General Surgery Recommendation today and transfer to the floor possibly in AM; Will go home with Home Health PT in AM   Consultants:   [facility] Gastroenterology (signed off)  General Surgery   Cardiology    Procedures:  EGD Findings:      Diffuse, white plaques were found in the mid  esophagus.      Diffuse moderate inflammation characterized by erosions, erythema and       friability was found in the entire examined stomach.      Localized mild inflammation characterized by erosions and erythema was       found in the duodenal bulb. Impression:               - Esophageal plaques were found, consistent with                            candidiasis. Due to the patient's history of                            diabetes and the fact that he had been bleeding                            this was not biopsy                           -  Gastritis.                           - Duodenitis.                           - No specimens collected.  ECHOCARDIOGRAM ------------------------------------------------------------------- Study Conclusions  - Left ventricle: Wall thickness was increased in a pattern of   moderate LVH. Systolic function was normal. The estimated   ejection fraction was in the range of 55% to 60%. The study is   not technically sufficient to allow evaluation of LV diastolic   function. - Aortic valve: There was moderate stenosis. Valve area (VTI): 1.57   cm^2. Valve area (Vmax): 1.74 cm^2. Valve area (Vmean): 1.53   cm^2. - Mitral valve: Moderately calcified annulus. Mildly thickened,   mildly calcified leaflets . There was mild regurgitation. Valve   area by continuity equation (using LVOT flow): 2.59 cm^2. - Left atrium: The atrium was moderately dilated. - Atrial septum: No defect or patent foramen ovale was identified. - Pulmonary arteries: PA peak pressure: 65 mm Hg (S). - Impressions: Gradients have increased since 2017 when they were   mean 16 mmHg and peak 26 mmHg.  Impressions:  - Gradients have increased since 2017 when they were mean 16 mmHg   and peak 26 mmHg.  COLONOSCOPY Findings:      The digital rectal exam findings include flat prostate bed.      An ulcerated non-obstructing medium-sized MASS was found in the cecum.       The mass  was non-circumferential. No bleeding was present. This was       biopsied with a cold forceps for histology. Estimated blood loss was       minimal.      A 6 mm polyp was found in the rectum (benign-appearing lesion). The       polyp was sessile. The polyp was removed with a cold snare. Resection       and retrieval were complete. Estimated blood loss: 3 mL.      Multiple large-mouthed diverticula were found in the sigmoid colon.      The exam was otherwise normal throughout the examined colon.      The terminal ileum appeared normal.      The retroflexed view of the distal rectum and anal verge was normal and       showed no anal or rectal abnormalities. Impression:               - Flat prostate bed found on digital rectal exam.                           - LIKELY MALIGNANT MASS in the cecum. Biopsied.                            This could well account for patient's                            recurrent/ongoing low grade blood loss.                           - One benign appearing 6 mm polyp in the rectum,  removed with a cold snare. Resected and retrieved.                           - Diverticulosis in the sigmoid colon.                           - The examined portion of the ileum was normal.                           - The distal rectum and anal verge are normal on                            retroflexion view.   Antimicrobials:  Anti-infectives (From admission, onward)   Start     Dose/Rate Route Frequency Ordered Stop   09/05/17 1800  clindamycin (CLEOCIN) IVPB 900 mg     900 mg 100 mL/hr over 30 Minutes Intravenous Every 8 hours 09/05/17 1320 09/05/17 1826   09/05/17 0815  gentamicin (GARAMYCIN) 450 mg in dextrose 5 % 100 mL IVPB     5 mg/kg  90.1 kg (Adjusted) 111.3 mL/hr over 60 Minutes Intravenous To Short Stay 09/05/17 0749 09/05/17 1101   09/05/17 0748  clindamycin (CLEOCIN) IVPB 900 mg     900 mg 100 mL/hr over 30 Minutes Intravenous 60 min pre-op  09/05/17 0749 09/05/17 1006   08/31/17 1100  fluconazole (DIFLUCAN) tablet 100 mg     100 mg Oral Daily 08/31/17 0921 09/02/17 1726     Subjective: Seen and examined in ICU and was doing well.  Denies any chest pain, shortness breath, nausea, vomiting.  States his abdomen is sore and distended but he is passing gas and has bowel sounds.  No lightheadedness or dizziness.  No concerns or complaints at this time and appreciate of of care that he has received.   Objective: Vitals:   09/06/17 1100 09/06/17 1200 09/06/17 1400 09/06/17 1500  BP: (!) 102/54 109/88 135/66 (!) 105/59  Pulse:      Resp: 20 17    Temp:      TempSrc:      SpO2: 97% 98% 98% 97%  Weight:      Height:        Intake/Output Summary (Last 24 hours) at 09/06/2017 1631 Last data filed at 09/06/2017 1400 Gross per 24 hour  Intake 2292.6 ml  Output 245 ml  Net 2047.6 ml   Filed Weights   08/30/17 1724 08/30/17 2240 09/05/17 1324  Weight: 108.9 kg (240 lb) 108.9 kg (240 lb) 104.7 kg (230 lb 13.2 oz)   Examination: Physical Exam:  Constitutional: Well nourished, well-developed obese Caucasian male is currently in no acute distress seen in the ICU and is doing well Eyes: Sclera anicteric.  Lids and conjunctive are normal ENMT: Sternal ears nose appear normal Neck: Appears supple with no JVD Respiratory: Diminished to auscultation bilaterally with coarse breath sounds.  He is wearing supplemental oxygen via nasal cannula but has no appreciable wheezing, rales, rhonchi.  Unlabored breathing Cardiovascular: Irregularly irregular but not tachycardic.  Has no appreciable lower extremity edema.  Has a 3/6 systolic murmur noted  Abdomen: Firm slightly tender to palpate and distended and hypertympanic likely from insufflation during surgery.  Abdomen feels taut however he does have bowel sounds are present x4 GU: Has indwelling Foley catheter now from surgery  and draining clear urine Musculoskeletal:  Skin: Skin is warm and  dry.  Incisions appeared slightly bloody and a honeycomb dressing midline no contractures or cyanosis.  No joint deformity noted Neurologic: Cranial nerves II through XII grossly intact no appreciable focal deficits Psychiatric: Normal mood and affect.  Intact judgment intact.  Patient awake alert and oriented  Data Reviewed: I have personally reviewed following labs and imaging studies  CBC: Recent Labs  Lab 09/02/17 0054  09/03/17 0102 09/04/17 0511 09/05/17 0504 09/05/17 1235 09/05/17 1333 09/06/17 0318  WBC 4.5  --  4.1 3.3* 3.6* 5.8 6.2 9.2  NEUTROABS 3.2  --  2.9 2.2 2.6  --   --  8.2*  HGB 8.9*   < > 8.8* 8.7* 8.7* 9.0* 9.0* 8.8*  HCT 28.4*   < > 28.0* 27.9* 27.7* 28.5* 28.7* 28.7*  MCV 95.0  --  94.6 94.3 93.6 93.8 94.1 95.0  PLT 188  --  180 176 173 191 194 202   < > = values in this interval not displayed.   Basic Metabolic Panel: Recent Labs  Lab 09/02/17 0054 09/03/17 0102 09/04/17 0511 09/05/17 0504 09/05/17 1235 09/05/17 1333 09/06/17 0318  NA 145 138 141 141 138  --  139  K 4.2 4.2 3.5 4.1 4.4  --  5.0  CL 107 103 103 103 103  --  103  CO2 30 26 29 30 26   --  26  GLUCOSE 140* 131* 121* 125* 196*  --  156*  BUN 21 19 15 15 15   --  18  CREATININE 1.18 1.15 1.18 1.25* 1.16 1.32* 1.61*  CALCIUM 9.2 9.1 9.0 9.2 8.9  --  8.9  MG 2.3 2.2 2.2 2.1  --   --  2.0  PHOS 3.9 4.0 4.5 4.0  --   --  4.5   GFR: Estimated Creatinine Clearance: 44.2 mL/min (A) (by C-G formula based on SCr of 1.61 mg/dL (H)). Liver Function Tests: Recent Labs  Lab 09/02/17 0054 09/03/17 0102 09/04/17 0511 09/05/17 0504 09/06/17 0318  AST 13* 16 15 22 15   ALT 12 10 10 10 11   ALKPHOS 59 55 53 57 53  BILITOT 0.9 0.6 0.5 0.6 0.7  PROT 7.1 6.9 6.5 6.7 6.7  ALBUMIN 3.6 3.7 3.5 3.6 3.4*   No results for input(s): LIPASE, AMYLASE in the last 168 hours. No results for input(s): AMMONIA in the last 168 hours. Coagulation Profile: Recent Labs  Lab 08/30/17 2135  INR 1.07    Cardiac Enzymes: No results for input(s): CKTOTAL, CKMB, CKMBINDEX, TROPONINI in the last 168 hours. BNP (last 3 results) No results for input(s): PROBNP in the last 8760 hours. HbA1C: Recent Labs    09/05/17 0800  HGBA1C 6.1*   CBG: Recent Labs  Lab 09/05/17 1633 09/05/17 2123 09/06/17 0749 09/06/17 1210 09/06/17 1607  GLUCAP 169* 157* 130* 160* 106*   Lipid Profile: No results for input(s): CHOL, HDL, LDLCALC, TRIG, CHOLHDL, LDLDIRECT in the last 72 hours. Thyroid Function Tests: No results for input(s): TSH, T4TOTAL, FREET4, T3FREE, THYROIDAB in the last 72 hours. Anemia Panel: No results for input(s): VITAMINB12, FOLATE, FERRITIN, TIBC, IRON, RETICCTPCT in the last 72 hours. Sepsis Labs: No results for input(s): PROCALCITON, LATICACIDVEN in the last 168 hours.  Recent Results (from the past 240 hour(s))  Body fluid culture     Status: Abnormal (Preliminary result)   Collection Time: 09/03/17  3:54 PM  Result Value Ref Range Status   Specimen Description  Final    PLEURAL RIGHT Performed at [facility], [place] [address], [place], [place] [address]    Special Requests   Final    NONE Performed at [facility], [place] [address], [place], [place] [address]    Gram Stain   Final    WBC PRESENT,BOTH PMN AND MONONUCLEAR NO ORGANISMS SEEN CYTOSPIN SMEAR    Culture (A)  Final    Fungus isolated-identification to follow CRITICAL RESULT CALLED TO, READ BACK BY AND VERIFIED WITH: Cannon Kettle RN, AT [phone] 09/06/17 BY D. VANHOOK REGARDING CULTURE GROWTH Performed at [facility], [place] [address] [place], [place] [address]    Report Status PENDING  Incomplete  Surgical pcr screen     Status: None   Collection Time: 09/05/17  8:26 AM  Result Value Ref Range Status   MRSA, PCR NEGATIVE NEGATIVE Final   Staphylococcus aureus NEGATIVE NEGATIVE Final    Comment: (NOTE) The Xpert SA Assay (FDA approved for NASAL specimens in patients  37 years of age and older), is one component of a comprehensive surveillance program. It is not intended to diagnose infection nor to guide or monitor treatment. Performed at [facility], [place] [address] [place], [place] [address]     Radiology Studies: No results found.   Scheduled Meds: . alvimopan  12 mg Oral BID  . buPROPion  150 mg Oral Daily  . citalopram  20 mg Oral Daily  . diltiazem  120 mg Oral Daily  . enoxaparin (LOVENOX) injection  40 mg Subcutaneous Q24H  . feeding supplement  237 mL Oral BID BM  . furosemide  80 mg Oral Daily  . guaiFENesin  1,200 mg Oral BID  . insulin aspart  0-15 Units Subcutaneous TID WC  . insulin aspart  0-5 Units Subcutaneous QHS  . ipratropium  0.5 mg Nebulization BID  . iron polysaccharides  150 mg Oral BID  . levalbuterol  0.63 mg Nebulization BID  . mouth rinse  15 mL Mouth Rinse BID  . pantoprazole  40 mg Oral BID  . potassium chloride SA  20 mEq Oral Daily  . pravastatin  20 mg Oral q1800  . saccharomyces boulardii  250 mg Oral BID  . temazepam  15 mg Oral QHS   Continuous Infusions: . lactated ringers 100 mL/hr at 09/06/17 1400    LOS: 7 days   Kerney Elbe, DO [facility] Pager [phone]  If 7PM-7AM, please contact night-coverage [email or web address] Password [email or web address] 09/06/2017, 4:31 PM

## 2017-09-07 ENCOUNTER — Inpatient Hospital Stay (HOSPITAL_COMMUNITY): Payer: PPO

## 2017-09-07 DIAGNOSIS — Z9049 Acquired absence of other specified parts of digestive tract: Secondary | ICD-10-CM

## 2017-09-07 DIAGNOSIS — L8990 Pressure ulcer of unspecified site, unspecified stage: Secondary | ICD-10-CM

## 2017-09-07 DIAGNOSIS — J189 Pneumonia, unspecified organism: Secondary | ICD-10-CM

## 2017-09-07 LAB — BASIC METABOLIC PANEL
Anion gap: 8 (ref 5–15)
Anion gap: 8 (ref 5–15)
BUN: 31 mg/dL — AB (ref 8–23)
BUN: 31 mg/dL — AB (ref 8–23)
CHLORIDE: 104 mmol/L (ref 98–111)
CHLORIDE: 102 mmol/L (ref 98–111)
CO2: 27 mmol/L (ref 22–32)
CO2: 29 mmol/L (ref 22–32)
CREATININE: 2.18 mg/dL — AB (ref 0.61–1.24)
CREATININE: 1.96 mg/dL — AB (ref 0.61–1.24)
Calcium: 8.9 mg/dL (ref 8.9–10.3)
Calcium: 9 mg/dL (ref 8.9–10.3)
GFR calc Af Amer: 31 mL/min — ABNORMAL LOW (ref >60)
GFR, EST AFRICAN AMERICAN: 35 mL/min — AB (ref >60)
GFR, EST NON AFRICAN AMERICAN: 26 mL/min — AB (ref >60)
GFR, EST NON AFRICAN AMERICAN: 30 mL/min — AB (ref >60)
Glucose, Bld: 186 mg/dL — ABNORMAL HIGH (ref 70–99)
Glucose, Bld: 164 mg/dL — ABNORMAL HIGH (ref 70–99)
POTASSIUM: 4.1 mmol/L (ref 3.5–5.1)
Potassium: 4 mmol/L (ref 3.5–5.1)
SODIUM: 139 mmol/L (ref 135–145)
Sodium: 139 mmol/L (ref 135–145)

## 2017-09-07 LAB — CBC
HCT: 26.7 % — ABNORMAL LOW (ref 39.0–52.0)
HEMOGLOBIN: 8.2 g/dL — AB (ref 13.0–17.0)
MCH: 29.3 pg (ref 26.0–34.0)
MCHC: 30.7 g/dL (ref 30.0–36.0)
MCV: 95.4 fL (ref 78.0–100.0)
PLATELETS: 162 10*3/uL (ref 150–400)
RBC: 2.8 MIL/uL — AB (ref 4.22–5.81)
RDW: 15.8 % — ABNORMAL HIGH (ref 11.5–15.5)
WBC: 6.9 10*3/uL (ref 4.0–10.5)

## 2017-09-07 LAB — GLUCOSE, CAPILLARY
GLUCOSE-CAPILLARY: 131 mg/dL — AB (ref 70–99)
GLUCOSE-CAPILLARY: 128 mg/dL — AB (ref 70–99)
GLUCOSE-CAPILLARY: 154 mg/dL — AB (ref 70–99)
Glucose-Capillary: 134 mg/dL — ABNORMAL HIGH (ref 70–99)

## 2017-09-07 LAB — BODY FLUID CULTURE

## 2017-09-07 MED ORDER — LACTATED RINGERS IV SOLN
INTRAVENOUS | Status: DC
  Administered 2017-09-07 – 2017-09-09 (×3): via INTRAVENOUS

## 2017-09-07 MED ORDER — BOOST / RESOURCE BREEZE PO LIQD CUSTOM
1.0000 | Freq: Three times a day (TID) | ORAL | Status: DC
  Administered 2017-09-07 – 2017-09-11 (×4): 1 via ORAL

## 2017-09-07 MED ORDER — AZTREONAM 2 G IJ SOLR
2.0000 g | Freq: Three times a day (TID) | INTRAMUSCULAR | Status: DC
  Administered 2017-09-08 – 2017-09-12 (×13): 2 g via INTRAVENOUS
  Filled 2017-09-07 (×15): qty 2

## 2017-09-07 MED ORDER — SODIUM CHLORIDE 0.9 % IV SOLN
INTRAVENOUS | Status: AC
  Administered 2017-09-07: 05:00:00 via INTRAVENOUS
  Filled 2017-09-07: qty 1000

## 2017-09-07 MED ORDER — SODIUM CHLORIDE 0.9 % IV SOLN
2.0000 g | INTRAVENOUS | Status: AC
  Administered 2017-09-07: 2 g via INTRAVENOUS
  Filled 2017-09-07: qty 2

## 2017-09-07 MED ORDER — LACTATED RINGERS IV SOLN
INTRAVENOUS | Status: DC
  Filled 2017-09-07: qty 1000

## 2017-09-07 NOTE — Progress Notes (Signed)
Pharmacy Antibiotic Note  Joel [name] is a 84 y.o. male admitted on 08/30/2017 with chronic GI bleeding, anemia,  AFib.  Underwent colectomy and umbilical hernia repair on 8/5.  Cecal mass seen on colonoscopy; surgery recommended.  Pharmacy has been consulted for Aztreonam dosing for possible HCAP (Zosyn changed to Aztreonam due to PCN allergy).  Plan: Aztreonam 2gm IV q8h Follow blood cultures   Height: 6' (182.9 cm) Weight: 230 lb 13.2 oz (104.7 kg) IBW/kg (Calculated) : 77.6  Temp (24hrs), Avg:97.9 F (36.6 C), Min:97.7 F (36.5 C), Max:98.2 F (36.8 C)  Recent Labs  Lab 09/05/17 0504 09/05/17 1235 09/05/17 1333 09/06/17 0318 09/07/17 0328 09/07/17 0620  WBC 3.6* 5.8 6.2 9.2 6.9  --   CREATININE 1.25* 1.16 1.32* 1.61* 2.18* 1.96*    Estimated Creatinine Clearance: 36.3 mL/min (A) (by C-G formula based on SCr of 1.96 mg/dL (H)).    Allergies  Allergen Reactions  . Penicillins Anaphylaxis    Has patient had a PCN reaction causing immediate rash, facial/tongue/throat swelling, SOB or lightheadedness with hypotension: unknown Has patient had a PCN reaction causing severe rash involving mucus membranes or skin necrosis: unknown Has patient had a PCN reaction that required hospitalization: unknown Has patient had a PCN reaction occurring within the last 10 years: no If all of the above answers are "NO", then may proceed with Cephalosporin use.   Marland Kitchen Antihistamines, Loratadine-Type Other (See Comments)    depression  . Other     Beta blockers-Novacaine  Caused depression    Antimicrobials this admission: 8/7 Aztreonam >>    Dose adjustments this admission:    Microbiology results: 8/7 BCx: sent 8/7 Body Fluid Cx: fungus isolated (probably contaminant)  8/5 MRSA PCR: negative  Thank you for allowing pharmacy to be a part of this patient's care.  Everette Rank, PharmD 09/07/2017 4:33 PM

## 2017-09-07 NOTE — Progress Notes (Signed)
Initial Nutrition Assessment  DOCUMENTATION CODES:   Obesity unspecified  INTERVENTION:   -Provide Boost Breeze po TID, each supplement provides 250 kcal and 9 grams of protein -Continue Ensure Surgery, provides 330 kcal and 18g protein  NUTRITION DIAGNOSIS:   Increased nutrient needs related to cancer and cancer related treatments as evidenced by estimated needs.  GOAL:   Patient will meet greater than or equal to 90% of their needs  MONITOR:   PO intake, Supplement acceptance, Labs, Weight trends, I & O's, Skin  REASON FOR ASSESSMENT:   NPO/Clear Liquid Diet   ASSESSMENT:   84 y.o. male with medical history significant for CAD, Afib not on anticoagulation due to history of recurrent GI bleed felt to be from small gastric AVMs causing iron deficiency anemia. 8/5: s/p Laparoscopic assisted right colectomy, primary repair of umbilical hernia  Patient in room with friend at bedside. This is LOS day 8 and pt has been on a clear liquid/NPO since 8/1. Pt with clear liquid tray at bedside, states he's only eaten the [place] ice so far. Feels ready for solid foods, feels hungry. States PTA, he was eating  ~2 meals a day, lunch and dinner. Pt tends to eat at restaurants for most of his meals. His friend at bedside confirmed that the pt always eats a lunch meal with him and then will pick up something for dinner at one of his favorite restaurants. Pt states his meals consist of a meat and vegetables.   Pt states he wants to lose weight. States he weighed 215 lb at the nursing home he was at. Then he began to gain fluid weight and states he is still trying to get that weight off. Currently patient weighs 230 lb.   Patient has been drinking Ensure Surgery supplements (has been on clears). Will order [facility] as an additional source of calories and protein given increased needs.  Medications: Lasix tablet daily, K-DUR tablet daily, Florastor capsule BID, Lactated Ringers infusion Labs  reviewed: CBG: 131 GFR: 30   NUTRITION - FOCUSED PHYSICAL EXAM:    Most Recent Value  Orbital Region  No depletion  Upper Arm Region  No depletion  Thoracic and Lumbar Region  Unable to assess  Buccal Region  No depletion  Temple Region  Mild depletion  Clavicle Bone Region  Mild depletion  Clavicle and Acromion Bone Region  No depletion  Scapular Bone Region  No depletion  Dorsal Hand  No depletion  Patellar Region  No depletion  Anterior Thigh Region  No depletion  Posterior Calf Region  No depletion  Edema (RD Assessment)  Mild       Diet Order:   Diet Order           Diet clear liquid Room service appropriate? Yes; Fluid consistency: Thin  Diet effective now          EDUCATION NEEDS:   Education needs have been addressed  Skin:  Skin Assessment: Skin Integrity Issues: Skin Integrity Issues:: DTI DTI: back  Last BM:  8/1  Height:   Ht Readings from Last 1 Encounters:  08/31/17 6' (1.829 m)    Weight:   Wt Readings from Last 1 Encounters:  09/05/17 230 lb 13.2 oz (104.7 kg)    Ideal Body Weight:  80.9 kg  BMI:  Body mass index is 31.31 kg/m.  Estimated Nutritional Needs:   Kcal:  2400-2600  Protein:  110-120g  Fluid:  2L/day   Clayton Bibles, MS, RD, LDN [place]  Long Inpatient Clinical Dietitian Pager: [phone] After Hours Pager: [phone]

## 2017-09-07 NOTE — Progress Notes (Signed)
PROGRESS NOTE  Joel [name] TGG:[id number] DOB: 03-12-1932 DOA: 08/30/2017 PCP: Wenda Low, MD  HPI/Recap of past 2 hours: 84 year old male with medical history significant for CAD, A. fib not on AC due to recurrent GI bleed from possible gastric AVMs, diabetes, hypertension, COPD, OSA, CKD, diastolic CHF presents to the ED complaining of generalized weakness, with abdominal bloating.  In the ED hemoglobin was noted to be 7, with FOBT positive.  GI consulted.  Patient admitted for further management   Today, patient complained of mild pain around abdominal surgical sites. Passing flatus. Denied any other new complaints, denies any chest pain, worsening shortness of breath, fever/chills.  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 Localized mild inflammation characterized by erosions and erythema was found in the duodenal bulb Colonoscopy revealed an ulcerated, likely malignant non-obstructing medium-sized mass in the cecum and a 6 mm polyp in the rectum that was sessile along with sigmoid colon diverticula CT abd/pelvis/chest showed: No evidence of metastatic disease test/abdomen/pelvis.  Possible hepatic cirrhosis Pathology confirms adenocarcinoma Gen surg on board: S/P laparoscopic R colectomy and umbilical repair on 0/0/93 C/w iron supplementation Continue PPI Monitor  closely  AKI on CKD Stage 3 Baseline Cr around 1.4, worsening since this admission, currently 1.96 Possibly worsened in the setting of surgery Gentle hydration with IVF Bladder scan Daily BMP  ?HCAP Noted productive cough Afebrile, no leukocytosis Urine strep pneumo, legionella pending BC X 2 pending collection CXR showing airspace consolidation in LLL with L pleural effusion Start IV Zosyn   Right Pleural Effusion s/p thoracentesis CT Scan showed Moderate Fluid Thoracentesis done, obtained 650 mL of gold fluid  C/w Diuresis, increased lasix to 80 mg po daily  Acute on Chronic Diastolic HF Improved ECHO with EF of 55% to 60%. The study isnot technically sufficient to allow evaluation of LV diastolic function. CXR initially showed Cardiomegaly and mild pulmonary edema, repeat showed pulmonary edema resolved Continue PO lasix 80 mg daily, monitor BMP closely Strict I's/O's, Daily Weights  Chronic respiratory failure/COPD on Home O2 C/w 2 liters of O2 via [place]  Continue Xopenex/Atrovent, mucinex Flutter valve, incentive spirometry  Persistent Atrial Fibrillation  S/P Multiple DCCV Rate controlled CHADS2VASc: 4 but not on AC due chronic GIB but now cardiology recommending starting the patient on Eliquis 5 mg twice daily once felt that he is stable from a surgical standpoint Cardiology on board C/w Diltiazem 120 mg po Daily  Esophageal Candidiasis Noted on EGD with plaques consistent with Candidiasis Completed Fluconazole 100 mg po TID x3 days per GI, stopped on 09/01/80  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 HbA1c was 6.0 Continue SSI, accuchecks Continue gabapentin Hold Metformin 1000 mg po BID and Glimepiride 1 mg po Daily  HTN C/w Diltiazem 120 mg po Daily Hold Losartan 25 mg po Daily  OSA Ordered CPAP  HLD/Dyslipidemia  C/w Lovastatin 20 mg po Daily  qHS   Gout C/w Home Allopurinol 100 mg po Daily   BPPV C/w Home Meclizine 25 mg po  TIDprn  Bipolar Affective Disorder C/w Citalopram but to 20 mg po Daily and Bupropion 150 mg po Daily  C/w Temazepam 15 mg po qHS        Code Status: Full  Family Communication: None at bedside  Disposition Plan: Home once stable   Consultants:  Gen surg  Cardiology  Procedures:  R colectomy and umbilical repair   Antimicrobials:  IV Aztreonam  DVT prophylaxis:  Lovenox   Objective: Vitals:   09/07/17 0800 09/07/17 0928 09/07/17 0944 09/07/17 1400  BP: 122/73  134/64 136/63  Pulse:  88 100 68  Resp: (!) 22 18    Temp: 97.8 F (36.6 C)  98 F (36.7 C) 97.7 F (36.5 C)  TempSrc: Oral  Oral Oral  SpO2: 100% 98% 100% 93%  Weight:      Height:        Intake/Output Summary (Last 24 hours) at 09/07/2017 1443 Last data filed at 09/07/2017 0643 Gross per 24 hour  Intake 1837.97 ml  Output 1480 ml  Net 357.97 ml   Filed Weights   08/30/17 1724 08/30/17 2240 09/05/17 1324  Weight: 108.9 kg (240 lb) 108.9 kg (240 lb) 104.7 kg (230 lb 13.2 oz)    Exam:   General:  NAD, chronically ill-appearing  Cardiovascular: S1, S2 present  Respiratory: Diminished breath sounds bilaterally  Abdomen: Soft, distended, tympanic, bowel sounds present  Musculoskeletal: No pedal edema bilaterally  Skin: Normal  Psychiatry: Normal mood   Data Reviewed: CBC: Recent Labs  Lab 09/02/17 0054  09/03/17 0102 09/04/17 0511 09/05/17 0504 09/05/17 1235 09/05/17 1333 09/06/17 0318 09/07/17 0328  WBC 4.5  --  4.1 3.3* 3.6* 5.8 6.2 9.2 6.9  NEUTROABS 3.2  --  2.9 2.2 2.6  --   --  8.2*  --   HGB 8.9*   < > 8.8* 8.7* 8.7* 9.0* 9.0* 8.8* 8.2*  HCT 28.4*   < > 28.0* 27.9* 27.7* 28.5* 28.7* 28.7* 26.7*  MCV 95.0  --  94.6 94.3 93.6 93.8 94.1 95.0 95.4  PLT 188  --  180 176 173 191 194 202 162   < > = values in this interval not displayed.   Basic Metabolic Panel: Recent Labs  Lab 09/02/17 0054 09/03/17 0102 09/04/17 3016 09/05/17 0504 09/05/17 1235  09/05/17 1333 09/06/17 0318 09/07/17 0328 09/07/17 0620  NA 145 138 141 141 138  --  139 139 139  K 4.2 4.2 3.5 4.1 4.4  --  5.0 4.1 4.0  CL 107 103 103 103 103  --  103 104 102  CO2 30 26 29 30 26   --  26 27 29   GLUCOSE 140* 131* 121* 125* 196*  --  156* 186* 164*  BUN 21 19 15 15 15   --  18 31* 31*  CREATININE 1.18 1.15 1.18 1.25* 1.16 1.32* 1.61* 2.18* 1.96*  CALCIUM 9.2 9.1 9.0 9.2 8.9  --  8.9 8.9 9.0  MG 2.3 2.2 2.2 2.1  --   --  2.0  --   --   PHOS 3.9 4.0 4.5 4.0  --   --  4.5  --   --    GFR: Estimated Creatinine Clearance: 36.3 mL/min (A) (by C-G formula based on SCr of 1.96 mg/dL (H)). Liver Function Tests: Recent Labs  Lab 09/02/17 0109 09/03/17 0102 09/04/17 3235 09/05/17 5732 09/06/17 2025  AST 13* 16 15 22 15   ALT 12 10 10 10 11   ALKPHOS 59 55 53 57 53  BILITOT 0.9 0.6 0.5 0.6 0.7  PROT 7.1 6.9 6.5 6.7 6.7  ALBUMIN 3.6 3.7 3.5 3.6 3.4*   No results for input(s): LIPASE, AMYLASE in the last 168 hours. No results for input(s): AMMONIA in the last 168 hours. Coagulation Profile: No results for input(s): INR, PROTIME in the last 168 hours. Cardiac Enzymes: No results for input(s): CKTOTAL, CKMB, CKMBINDEX, TROPONINI in the last 168 hours. BNP (last 3 results) No results for input(s): PROBNP in the last 8760 hours. HbA1C: Recent Labs    09/05/17 0800  HGBA1C 6.1*   CBG: Recent Labs  Lab 09/06/17 0749 09/06/17 1210 09/06/17 1607 09/06/17 2207 09/07/17 0749  GLUCAP 130* 160* 106* 138* 131*   Lipid Profile: No results for input(s): CHOL, HDL, LDLCALC, TRIG, CHOLHDL, LDLDIRECT in the last 72 hours. Thyroid Function Tests: No results for input(s): TSH, T4TOTAL, FREET4, T3FREE, THYROIDAB in the last 72 hours. Anemia Panel: No results for input(s): VITAMINB12, FOLATE, FERRITIN, TIBC, IRON, RETICCTPCT in the last 72 hours. Urine analysis:    Component Value Date/Time   COLORURINE YELLOW 01/27/2016 1730   APPEARANCEUR CLOUDY (A) 01/27/2016 1730    LABSPEC 1.019 01/27/2016 1730   PHURINE 5.0 01/27/2016 1730   GLUCOSEU NEGATIVE 01/27/2016 1730   HGBUR NEGATIVE 01/27/2016 1730   BILIRUBINUR NEGATIVE 01/27/2016 1730   KETONESUR 5 (A) 01/27/2016 1730   PROTEINUR NEGATIVE 01/27/2016 1730   UROBILINOGEN 0.2 10/22/2014 2136   NITRITE NEGATIVE 01/27/2016 1730   LEUKOCYTESUR NEGATIVE 01/27/2016 1730   Sepsis Labs: @LABRCNTIP (procalcitonin:4,lacticidven:4)  ) Recent Results (from the past 240 hour(s))  Body fluid culture     Status: Abnormal   Collection Time: 09/03/17  3:54 PM  Result Value Ref Range Status   Specimen Description   Final    PLEURAL RIGHT Performed at [facility], [place] [address] [place], [place] [address]    Special Requests   Final    NONE Performed at [facility], [place] [address] [place], [place] [address]    Gram Stain   Final    WBC PRESENT,BOTH PMN AND MONONUCLEAR NO ORGANISMS SEEN CYTOSPIN SMEAR    Culture (A)  Final    FUNGUS (MOLD) ISOLATED, PROBABLE CONTAMINANT/COLONIZER (SAPROPHYTE). CONTACT MICROBIOLOGY IF FURTHER IDENTIFICATION REQUIRED [id number]. CRITICAL RESULT CALLED TO, READ BACK BY AND VERIFIED WITH: Cannon Kettle RN, AT [phone] 09/06/17 BY D. VANHOOK REGARDING CULTURE GROWTH Performed at [facility], [place] [address] [place], [place] [address]    Report Status 09/07/2017 FINAL  Final  Surgical pcr screen     Status: None   Collection Time: 09/05/17  8:26 AM  Result Value Ref Range Status   MRSA, PCR NEGATIVE NEGATIVE Final   Staphylococcus aureus NEGATIVE NEGATIVE Final    Comment: (NOTE) The Xpert SA Assay (FDA approved for NASAL specimens in patients 80 years of age and older), is one component of a comprehensive surveillance program. It is not intended to diagnose infection nor to guide or monitor treatment. Performed at [facility], [place] [address] [place], [place] [address]       Studies: Dg Chest 1 View  Result  Date: 09/07/2017 CLINICAL DATA:  Pleural effusion EXAM: CHEST  1 VIEW COMPARISON:  September 04, 2017 FINDINGS: There is airspace consolidation in the left lower lobe with left pleural effusion. There is a minimal right pleural effusion. There  is mild atelectasis in the medial right base. There is cardiomegaly with pulmonary vascularity within normal limits. There is aortic atherosclerosis. No adenopathy. Patient is status post median sternotomy. No evident bone lesions. IMPRESSION: New airspace consolidation left lower lobe with left pleural effusion. Minimal right pleural effusion. Atelectasis medial right base. Stable cardiomegaly.  There is aortic atherosclerosis. Aortic Atherosclerosis (ICD10-I70.0). Electronically Signed   By: Lowella Grip III M.D.   On: 09/07/2017 07:00    Scheduled Meds: . alvimopan  12 mg Oral BID  . buPROPion  150 mg Oral Daily  . citalopram  20 mg Oral Daily  . diltiazem  120 mg Oral Daily  . enoxaparin (LOVENOX) injection  40 mg Subcutaneous Q24H  . feeding supplement  237 mL Oral BID BM  . furosemide  80 mg Oral Daily  . guaiFENesin  1,200 mg Oral BID  . insulin aspart  0-15 Units Subcutaneous TID WC  . insulin aspart  0-5 Units Subcutaneous QHS  . ipratropium  0.5 mg Nebulization BID  . iron polysaccharides  150 mg Oral BID  . levalbuterol  0.63 mg Nebulization BID  . mouth rinse  15 mL Mouth Rinse BID  . pantoprazole  40 mg Oral BID  . potassium chloride SA  20 mEq Oral Daily  . pravastatin  20 mg Oral q1800  . saccharomyces boulardii  250 mg Oral BID  . temazepam  15 mg Oral QHS    Continuous Infusions: . lactated ringers 100 mL/hr at 09/07/17 0737     LOS: 8 days     Alma Friendly, MD [facility]   If 7PM-7AM, please contact night-coverage [email or web address] Password [facility] 09/07/2017, 2:43 PM

## 2017-09-07 NOTE — Progress Notes (Signed)
Physical Therapy Treatment Patient Details Name: Joel [name] MRN: [id number] DOB: [birth year] Today's Date: 09/07/2017    History of Present Illness This 84 y.o. male is s/p laparoscopic R colectomy and repair of umbilical due to colon CA.  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 Surgical history of R hip cannulated pinning in 03/2017, R shoulder surgery in 2011/2012, and electrotherapy for depression in 1969.     PT Comments    Assisted pt to EOB required much assist due to distended ABD.  Mod c/o dizziness.  Vitals 137/86, HR 95, RA 96% with mild dyspnea.  Assisted with amb.  Used B platform EVA walker for safety/first attempt walking.  Pt tends to "toe walk" on R LE s/p hip Fx Feb 2019.  "My leg is shorter than the other".  Limited distance due to fatigue/weakness/dyspnea.  Assisted to recliner upright.  Pt still c/o "bloated" ABD to point it effects his breathing/lung volume. Instructed on "rocking" while in chair and importance of mobility.   Pt wants to D/C to home but he will need to move better than he did today.    Follow Up Recommendations  Home health PT     Equipment Recommendations  None recommended by PT    Recommendations for Other Services       Precautions / Restrictions Precautions Precautions: Fall Precaution Comments: abd sx Restrictions Weight Bearing Restrictions: No    Mobility  Bed Mobility Overal bed mobility: Needs Assistance Bed Mobility: Supine to Sit     Supine to sit: Min assist;Mod assist;HOB elevated     General bed mobility comments: HOB raised completely and pt used bedrails on bil sides due to distended ABD  Transfers Overall transfer level: Needs assistance Equipment used: Rolling walker (2 wheeled) Transfers: Risk manager;Sit to/from Stand Sit to Stand: Min assist;+2 safety/equipment         General transfer comment: assist to rise and steady from elevated  bed  Ambulation/Gait Ambulation/Gait assistance: Min assist;Mod assist Gait Distance (Feet): 32 Feet Assistive device: Bilateral platform walker(EVA walker ) Gait Pattern/deviations: Step-to pattern;Decreased stance time - right Gait velocity: decreased    General Gait Details: pt tends to "toe walk" R LE "since my hip fx" plus used EVA walker for safety/first time amb attempt   Stairs             Wheelchair Mobility    Modified Rankin (Stroke Patients Only)       Balance                                            Cognition Arousal/Alertness: Awake/alert Behavior During Therapy: WFL for tasks assessed/performed Overall Cognitive Status: Within Functional Limits for tasks assessed                                 General Comments: pleasant      Exercises      General Comments        Pertinent Vitals/Pain Pain Assessment: Faces Faces Pain Scale: Hurts little more Pain Location: abdomen R side and MAX c/o ABD bloating Pain Descriptors / Indicators: Tightness;Grimacing Pain Intervention(s): Monitored during session;Repositioned    Home Living  Prior Function            PT Goals (current goals can now be found in the care plan section) Progress towards PT goals: Progressing toward goals    Frequency    Min 3X/week      PT Plan Current plan remains appropriate    Co-evaluation              AM-PAC PT "6 Clicks" Daily Activity  Outcome Measure  Difficulty turning over in bed (including adjusting bedclothes, sheets and blankets)?: A Lot Difficulty moving from lying on back to sitting on the side of the bed? : A Lot Difficulty sitting down on and standing up from a chair with arms (e.g., wheelchair, bedside commode, etc,.)?: A Lot Help needed moving to and from a bed to chair (including a wheelchair)?: A Lot Help needed walking in hospital room?: A Lot Help needed climbing 3-5  steps with a railing? : Total 6 Click Score: 11    End of Session Equipment Utilized During Treatment: Gait belt Activity Tolerance: Patient limited by fatigue Patient left: in chair;with call bell/phone within reach Nurse Communication: Mobility status PT Visit Diagnosis: Unsteadiness on feet (R26.81);Other abnormalities of gait and mobility (R26.89)     Time: 5449-2010 PT Time Calculation (min) (ACUTE ONLY): 27 min  Charges:  $Gait Training: 8-22 mins $Therapeutic Activity: 8-22 mins                     Rica Koyanagi  PTA [facility]  Acute  Rehab Pager      [phone]

## 2017-09-07 NOTE — Progress Notes (Signed)
Assumed care of patient, agree with previous RN assessment. Will continue to monitor.

## 2017-09-07 NOTE — Progress Notes (Signed)
2 Days Post-Op  Subjective: Alert.  More animated.  Says he feels fine.  Says he is passing flatus.  Tolerating clear liquids.  Denies shortness of breath Got up to chair yesterday  Appreciate input from [facility] and cardiology  Urine output only 725 cc yesterday BUN up to 31 and creatinine up to 2.18.  Suspect CKD and a component of volume depletion. Hemoglobin has drifted down a little to 8.2.  Potassium 4.1.  Glucose 186.  Chest x-ray shows right lung base basically clear.  Left lung base obscured by large heart.  Objective: Vital signs in last 24 hours: Temp:  [97.8 F (36.6 C)-99.2 F (37.3 C)] 98.2 F (36.8 C) (08/07 0400) Resp:  [10-29] 21 (08/07 0400) BP: (97-143)/(32-88) 123/59 (08/07 0400) SpO2:  [83 %-100 %] 100 % (08/07 0400) Last BM Date: 09/06/17  Intake/Output from previous day: 08/06 0701 - 08/07 0700 In: 2136.2 [I.V.:2136.2] Out: 725 [Urine:725] Intake/Output this shift: Total I/O In: 839.6 [I.V.:839.6] Out: 580 [Urine:580]   EXAM: General appearance:  Awake and appropriate and appropriate.  Minimal distress. Resp: Clear anteriorly without wheeze or rhonchi.  Vital capacity does not appear very great. GI: Soft.   distended and tympanitic.  Bowel sounds present.  Wounds look fine.     Lab Results:  Results for orders placed or performed during the hospital encounter of 08/30/17 (from the past 24 hour(s))  Glucose, capillary     Status: Abnormal   Collection Time: 09/06/17  7:49 AM  Result Value Ref Range   Glucose-Capillary 130 (H) 70 - 99 mg/dL   Comment 1 Notify RN    Comment 2 Document in Chart   Glucose, capillary     Status: Abnormal   Collection Time: 09/06/17 12:10 PM  Result Value Ref Range   Glucose-Capillary 160 (H) 70 - 99 mg/dL   Comment 1 Notify RN    Comment 2 Document in Chart   Glucose, capillary     Status: Abnormal   Collection Time: 09/06/17  4:07 PM  Result Value Ref Range   Glucose-Capillary 106 (H) 70 - 99 mg/dL   Glucose, capillary     Status: Abnormal   Collection Time: 09/06/17 10:07 PM  Result Value Ref Range   Glucose-Capillary 138 (H) 70 - 99 mg/dL   Comment 1 Notify RN   CBC     Status: Abnormal   Collection Time: 09/07/17  3:28 AM  Result Value Ref Range   WBC 6.9 4.0 - 10.5 K/uL   RBC 2.80 (L) 4.22 - 5.81 MIL/uL   Hemoglobin 8.2 (L) 13.0 - 17.0 g/dL   HCT 26.7 (L) 39.0 - 52.0 %   MCV 95.4 78.0 - 100.0 fL   MCH 29.3 26.0 - 34.0 pg   MCHC 30.7 30.0 - 36.0 g/dL   RDW 15.8 (H) 11.5 - 15.5 %   Platelets 162 150 - 400 K/uL  Basic metabolic panel     Status: Abnormal   Collection Time: 09/07/17  3:28 AM  Result Value Ref Range   Sodium 139 135 - 145 mmol/L   Potassium 4.1 3.5 - 5.1 mmol/L   Chloride 104 98 - 111 mmol/L   CO2 27 22 - 32 mmol/L   Glucose, Bld 186 (H) 70 - 99 mg/dL   BUN 31 (H) 8 - 23 mg/dL   Creatinine, Ser 2.18 (H) 0.61 - 1.24 mg/dL   Calcium 8.9 8.9 - 10.3 mg/dL   GFR calc non Af Amer 26 (L) >60 mL/min  GFR calc Af Amer 31 (L) >60 mL/min   Anion gap 8 5 - 15     Studies/Results: No results found.  Marland Kitchen alvimopan  12 mg Oral BID  . buPROPion  150 mg Oral Daily  . citalopram  20 mg Oral Daily  . diltiazem  120 mg Oral Daily  . enoxaparin (LOVENOX) injection  40 mg Subcutaneous Q24H  . feeding supplement  237 mL Oral BID BM  . furosemide  80 mg Oral Daily  . guaiFENesin  1,200 mg Oral BID  . insulin aspart  0-15 Units Subcutaneous TID WC  . insulin aspart  0-5 Units Subcutaneous QHS  . ipratropium  0.5 mg Nebulization BID  . iron polysaccharides  150 mg Oral BID  . levalbuterol  0.63 mg Nebulization BID  . mouth rinse  15 mL Mouth Rinse BID  . pantoprazole  40 mg Oral BID  . potassium chloride SA  20 mEq Oral Daily  . pravastatin  20 mg Oral q1800  . saccharomyces boulardii  250 mg Oral BID  . temazepam  15 mg Oral QHS     Assessment/Plan: s/p Procedure(s): LAPAROSCOPIC RIGHT COLON RESECTION  POD #2.  Laparoscopic right colectomy for  cancer Stable Transfer to floor Since distended and seems to have ileus, will stay on liquid diet today Up to chair PT Foley out today Check pathology  Elevated creatinine and BUN.  Background CKD.  Suspect volume depletion.  Will gently hydrate over the next 24 hours  Atrial fibrillation-controlled at rate of 79-96.  On Cardizem.  Cardiology recommends Cardizem CD 120 mg daily.  When able, start apixaban 5 mg twice daily.  Restart losartan once BP more stable.  Follow-up with Dr. Minus Breeding in 2 weeks.  Pulmonary hypertension COPD HTN -Cardizem.  Restart losartan for BP more stable. OSA on BiPAP.-Continue BiPAP per respiratory therapy Moderate aortic stenosis -yearly echo recommended Pleural effusion-status post thoracentesis preop.  Chest x-ray shows mild pulmonary congestion but no recurrence of effusion.  On Lasix per [facility]. Acute on chronic blood loss anemia.-No evidence of active bleeding Hypertension.  BP seems controlled.  Medication regimen per cardiology and [facility] DM 2 complicated by diabetic neuropathy in feet.-See recommendations by [facility]    @PROBHOSP @  LOS: 8 days    Joel [name] 09/07/2017  . .prob

## 2017-09-08 ENCOUNTER — Inpatient Hospital Stay (HOSPITAL_COMMUNITY): Payer: PPO

## 2017-09-08 ENCOUNTER — Encounter (HOSPITAL_COMMUNITY): Payer: Self-pay | Admitting: Radiology

## 2017-09-08 LAB — BLOOD GAS, ARTERIAL
ACID-BASE EXCESS: 1.6 mmol/L (ref 0.0–2.0)
BICARBONATE: 25.5 mmol/L (ref 20.0–28.0)
Drawn by: 441261
O2 Content: 2 L/min
O2 Saturation: 97.5 %
PO2 ART: 92 mmHg (ref 83.0–108.0)
Patient temperature: 98.6
pCO2 arterial: 39.3 mmHg (ref 32.0–48.0)
pH, Arterial: 7.429 (ref 7.350–7.450)

## 2017-09-08 LAB — CBC WITH DIFFERENTIAL/PLATELET
BASOS ABS: 0 10*3/uL (ref 0.0–0.1)
Basophils Relative: 0 %
EOS ABS: 0.2 10*3/uL (ref 0.0–0.7)
EOS PCT: 4 %
HCT: 26.1 % — ABNORMAL LOW (ref 39.0–52.0)
Hemoglobin: 8.1 g/dL — ABNORMAL LOW (ref 13.0–17.0)
LYMPHS PCT: 6 %
Lymphs Abs: 0.3 10*3/uL — ABNORMAL LOW (ref 0.7–4.0)
MCH: 28.8 pg (ref 26.0–34.0)
MCHC: 31 g/dL (ref 30.0–36.0)
MCV: 92.9 fL (ref 78.0–100.0)
MONO ABS: 0.5 10*3/uL (ref 0.1–1.0)
Monocytes Relative: 9 %
Neutro Abs: 4.5 10*3/uL (ref 1.7–7.7)
Neutrophils Relative %: 81 %
PLATELETS: 195 10*3/uL (ref 150–400)
RBC: 2.81 MIL/uL — AB (ref 4.22–5.81)
RDW: 15.6 % — AB (ref 11.5–15.5)
WBC: 5.6 10*3/uL (ref 4.0–10.5)

## 2017-09-08 LAB — BASIC METABOLIC PANEL
Anion gap: 9 (ref 5–15)
BUN: 26 mg/dL — AB (ref 8–23)
CALCIUM: 9 mg/dL (ref 8.9–10.3)
CO2: 26 mmol/L (ref 22–32)
Chloride: 104 mmol/L (ref 98–111)
Creatinine, Ser: 1.48 mg/dL — ABNORMAL HIGH (ref 0.61–1.24)
GFR calc Af Amer: 49 mL/min — ABNORMAL LOW (ref >60)
GFR, EST NON AFRICAN AMERICAN: 42 mL/min — AB (ref >60)
GLUCOSE: 137 mg/dL — AB (ref 70–99)
POTASSIUM: 3.6 mmol/L (ref 3.5–5.1)
SODIUM: 139 mmol/L (ref 135–145)

## 2017-09-08 LAB — GLUCOSE, CAPILLARY
GLUCOSE-CAPILLARY: 138 mg/dL — AB (ref 70–99)
GLUCOSE-CAPILLARY: 135 mg/dL — AB (ref 70–99)
GLUCOSE-CAPILLARY: 174 mg/dL — AB (ref 70–99)
GLUCOSE-CAPILLARY: 122 mg/dL — AB (ref 70–99)

## 2017-09-08 LAB — STREP PNEUMONIAE URINARY ANTIGEN: Strep Pneumo Urinary Antigen: NEGATIVE

## 2017-09-08 MED ORDER — VANCOMYCIN HCL 10 G IV SOLR
2000.0000 mg | Freq: Once | INTRAVENOUS | Status: AC
  Administered 2017-09-08: 2000 mg via INTRAVENOUS
  Filled 2017-09-08: qty 2000

## 2017-09-08 MED ORDER — APIXABAN 5 MG PO TABS
5.0000 mg | ORAL_TABLET | Freq: Two times a day (BID) | ORAL | Status: DC
  Administered 2017-09-08: 5 mg via ORAL
  Filled 2017-09-08: qty 1

## 2017-09-08 MED ORDER — VANCOMYCIN HCL IN DEXTROSE 1-5 GM/200ML-% IV SOLN: 1000.0000 mg | INTRAVENOUS | Status: DC

## 2017-09-08 NOTE — Progress Notes (Signed)
Pharmacy Antibiotic Note  Joel [name] is a 84 y.o. male admitted on 08/30/2017 after PCP visit for low hemoglobin. Hemoglobin confirmed 7 and hemoccult was positive for blood. Patient had colonoscopy significant for cecal mass. Patient underwent surgical resection of adenocarcinoma laproscopically and primary repair of umbilical hernia on 8/5. Chest x-ray on 8/7 significant for new airspace consolidation left lower lobe with left pleural effusion. Patient started on aztreonam on 8/7 for suspected HCAP due to documented anaphylaxis to penicillin. Patient transferred to ICU on 8/8 secondary to AMS. Initiate vancomycin on 8/8 to provide Gram + coverage.   -afebrile, WBC WNL -Scr trending down 1.48 (CrCl 49) -Blood cultures on 8/7 no growth thus far  Plan: -Continue aztreonam 2 g IV q8h -Initiate vancomycin 2000 mg IV x 1, then 1000 mg q24h (estimated AUC 434) -Monitor: renal function, clinical status -F/u with culture results   Height: 6' (182.9 cm) Weight: 237 lb 8 oz (107.7 kg) IBW/kg (Calculated) : 77.6  Temp (24hrs), Avg:98 F (36.7 C), Min:97.7 F (36.5 C), Max:98.4 F (36.9 C)  Recent Labs  Lab 09/05/17 1235 09/05/17 1333 09/06/17 0318 09/07/17 0328 09/07/17 0620 09/08/17 0540  WBC 5.8 6.2 9.2 6.9  --  5.6  CREATININE 1.16 1.32* 1.61* 2.18* 1.96* 1.48*    Estimated Creatinine Clearance: 48.8 mL/min (A) (by C-G formula based on SCr of 1.48 mg/dL (H)).    Allergies  Allergen Reactions  . Penicillins Anaphylaxis    Has patient had a PCN reaction causing immediate rash, facial/tongue/throat swelling, SOB or lightheadedness with hypotension: unknown Has patient had a PCN reaction causing severe rash involving mucus membranes or skin necrosis: unknown Has patient had a PCN reaction that required hospitalization: unknown Has patient had a PCN reaction occurring within the last 10 years: no If all of the above answers are "NO", then may proceed with Cephalosporin use.   Marland Kitchen  Antihistamines, Loratadine-Type Other (See Comments)    depression  . Other     Beta blockers-Novacaine  Caused depression    Antimicrobials this admission: 8/7 aztreonam>> 8/8 vancomycin>>  Dose adjustments this admission: Microbiology results: 8/7 BCx x2:  8/7 MRSA PCR: negative 8/5 Body FluidCx: fungus (likely contaminant)    Thank you for allowing pharmacy to be a part of this patient's care.  Quintella Baton 09/08/2017 10:54 AM

## 2017-09-08 NOTE — Progress Notes (Addendum)
PROGRESS NOTE  Joel [name] AOZ:[id number] DOB: Jun 30, 1932 DOA: 08/30/2017 PCP: Wenda Low, MD  HPI/Recap of past 76 hours: 84 year old male with medical history significant for CAD, A. fib not on AC due to recurrent GI bleed from possible gastric AVMs, diabetes, hypertension, COPD, OSA, CKD, diastolic CHF presents to the ED complaining of generalized weakness, with abdominal bloating.  In the ED hemoglobin was noted to be 7, with FOBT positive.  GI consulted.  Patient admitted for further management.   Early this am, pt was noted to be very lethargic,sleepy, not easy to arouse. Was somewhat oriented. VSS remained stable. RRT was called. Patient transferred to SDU. Denied any chest pain, worsening abdominal pain, N/V, cough, SOB, fever/chills.  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 Localized mild inflammation characterized by erosions and erythema was found in the duodenal bulb Colonoscopy revealed an ulcerated, likely malignant non-obstructing medium-sized mass in the cecum and a 6 mm polyp in the rectum that was sessile along with sigmoid colon diverticula CT abd/pelvis/chest showed: No evidence of metastatic disease test/abdomen/pelvis.  Possible hepatic cirrhosis Pathology confirms adenocarcinoma Gen surg on board: S/P laparoscopic R colectomy and umbilical repair on  06/03/82 C/w iron supplementation Continue PPI Monitor closely  Acute metabolic encephalopathy Unknown etiology, currently back to baseline No narcotics given ABG unremarkable CT head no acute intracranial abnormalities Continue IV AB, supplemental O2, cpap Monitor in SDU  AKI on CKD Stage 3 Improving Baseline Cr around 1.4 Possibly worsened in the setting of surgery Gentle hydration with IVF Bladder scan Daily BMP  HCAP Noted productive cough Afebrile, no leukocytosis Urine strep pneumo, legionella pending BC X 2 NGTD CXR showing airspace consolidation in LLL with L pleural effusion Continue Aztreonam and start IV Vanc  Right Pleural Effusion s/p thoracentesis CT Scan showed Moderate Fluid Thoracentesis done, obtained 650 mL of gold fluid  C/w Diuresis, increased lasix to 80 mg po daily  Acute on Chronic Diastolic HF Improved ECHO with EF of 55% to 60%. The study isnot technically sufficient to allow evaluation of LV diastolic function. CXR initially showed Cardiomegaly and mild pulmonary edema, repeat showed pulmonary edema resolved Continue PO lasix 80 mg daily, monitor BMP closely Strict I's/O's, Daily Weights  Chronic respiratory failure/COPD on Home O2 C/w 2 liters of O2 via [place]  Continue Xopenex/Atrovent, mucinex Flutter valve, incentive spirometry  Persistent Atrial Fibrillation  S/P Multiple DCCV Rate controlled CHADS2VASc: 4 but not on AC due chronic GIB but now cardiology recommending starting the patient on Eliquis 5 mg twice daily once felt that he is stable from a surgical standpoint Start Eliquis, pharmacy to dose Cardiology signed off C/w Diltiazem 120 mg po Daily  Esophageal Candidiasis Noted on EGD with plaques consistent with Candidiasis Completed Fluconazole 100 mg po TID x3 days per GI, stopped on 02/03/22  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 HbA1c was 6.0 Continue  SSI, accuchecks Continue gabapentin Hold Metformin 1000  mg po BID and Glimepiride 1 mg po Daily  HTN C/w Diltiazem 120 mg po Daily Hold Losartan 25 mg po Daily  OSA Ordered CPAP  HLD/Dyslipidemia  C/w statin  Gout Hold home Allopurinol 100 mg po Daily   BPPV Hold home Meclizine 25 mg po TIDprn  Bipolar Affective Disorder C/w Citalopram 20 mg po Daily and Bupropion 150 mg po Daily  C/w Temazepam 15 mg po qHS        Code Status: Full  Family Communication: None at bedside  Disposition Plan: Home once stable   Consultants:  Gen surg  Cardiology  Procedures:  R colectomy and umbilical repair   Antimicrobials:  IV Aztreonam  IV Zosyn  DVT prophylaxis:  Lovenox   Objective: Vitals:   09/08/17 1200 09/08/17 1300 09/08/17 1400 09/08/17 1600  BP: (!) 151/79 138/82 (!) 138/56   Pulse: 87 97 84   Resp: (!) 25 13 18    Temp: 98 F (36.7 C)   (!) 97.4 F (36.3 C)  TempSrc: Oral   Axillary  SpO2: 91% 93% (!) 89%   Weight:      Height:        Intake/Output Summary (Last 24 hours) at 09/08/2017 1712 Last data filed at 09/08/2017 1300 Gross per 24 hour  Intake 2076.08 ml  Output 1050 ml  Net 1026.08 ml   Filed Weights   08/30/17 2240 09/05/17 1324 09/08/17 0500  Weight: 108.9 kg 104.7 kg 107.7 kg    Exam:   General:  NAD, chronically ill-appearing, lethargic  Cardiovascular: S1, S2 present  Respiratory: Diminished breath sounds bilaterally  Abdomen: Soft, distended, tympanic, bowel sounds present  Musculoskeletal: No pedal edema bilaterally  Skin: Normal  Psychiatry: Normal mood   Data Reviewed: CBC: Recent Labs  Lab 09/03/17 0102 09/04/17 0511 09/05/17 0504 09/05/17 1235 09/05/17 1333 09/06/17 0318 09/07/17 0328 09/08/17 0540  WBC 4.1 3.3* 3.6* 5.8 6.2 9.2 6.9 5.6  NEUTROABS 2.9 2.2 2.6  --   --  8.2*  --  4.5  HGB 8.8* 8.7* 8.7* 9.0* 9.0* 8.8* 8.2* 8.1*  HCT 28.0* 27.9* 27.7* 28.5* 28.7* 28.7* 26.7* 26.1*  MCV 94.6 94.3 93.6 93.8 94.1 95.0 95.4 92.9  PLT 180 176 173 191  194 202 162 277   Basic Metabolic Panel: Recent Labs  Lab 09/02/17 0054 09/03/17 0102 09/04/17 0511 09/05/17 0504 09/05/17 1235 09/05/17 1333 09/06/17 0318 09/07/17 0328 09/07/17 0620 09/08/17 0540  NA 145 138 141 141 138  --  139 139 139 139  K 4.2 4.2 3.5 4.1 4.4  --  5.0 4.1 4.0 3.6  CL 107 103 103 103 103  --  103 104 102 104  CO2 30 26 29 30 26   --  26 27 29 26   GLUCOSE 140* 131* 121* 125* 196*  --  156* 186* 164* 137*  BUN 21 19 15 15 15   --  18 31* 31* 26*  CREATININE 1.18 1.15 1.18 1.25* 1.16 1.32* 1.61* 2.18* 1.96* 1.48*  CALCIUM 9.2 9.1 9.0 9.2 8.9  --  8.9 8.9 9.0 9.0  MG 2.3 2.2 2.2 2.1  --   --  2.0  --   --   --   PHOS 3.9 4.0 4.5 4.0  --   --  4.5  --   --   --    GFR: Estimated Creatinine Clearance: 48.8 mL/min (A) (by C-G formula based on SCr of 1.48 mg/dL (H)). Liver Function  Tests: Recent Labs  Lab 09/02/17 0054 09/03/17 0102 09/04/17 0511 09/05/17 0504 09/06/17 0318  AST 13* 16 15 22 15   ALT 12 10 10 10 11   ALKPHOS 59 55 53 57 53  BILITOT 0.9 0.6 0.5 0.6 0.7  PROT 7.1 6.9 6.5 6.7 6.7  ALBUMIN 3.6 3.7 3.5 3.6 3.4*   No results for input(s): LIPASE, AMYLASE in the last 168 hours. No results for input(s): AMMONIA in the last 168 hours. Coagulation Profile: No results for input(s): INR, PROTIME in the last 168 hours. Cardiac Enzymes: No results for input(s): CKTOTAL, CKMB, CKMBINDEX, TROPONINI in the last 168 hours. BNP (last 3 results) No results for input(s): PROBNP in the last 8760 hours. HbA1C: No results for input(s): HGBA1C in the last 72 hours. CBG: Recent Labs  Lab 09/07/17 1617 09/07/17 2114 09/08/17 0740 09/08/17 1146 09/08/17 1527  GLUCAP 154* 134* 138* 135* 174*   Lipid Profile: No results for input(s): CHOL, HDL, LDLCALC, TRIG, CHOLHDL, LDLDIRECT in the last 72 hours. Thyroid Function Tests: No results for input(s): TSH, T4TOTAL, FREET4, T3FREE, THYROIDAB in the last 72 hours. Anemia Panel: No results for input(s):  VITAMINB12, FOLATE, FERRITIN, TIBC, IRON, RETICCTPCT in the last 72 hours. Urine analysis:    Component Value Date/Time   COLORURINE YELLOW 01/27/2016 1730   APPEARANCEUR CLOUDY (A) 01/27/2016 1730   LABSPEC 1.019 01/27/2016 1730   PHURINE 5.0 01/27/2016 1730   GLUCOSEU NEGATIVE 01/27/2016 1730   HGBUR NEGATIVE 01/27/2016 1730   BILIRUBINUR NEGATIVE 01/27/2016 1730   KETONESUR 5 (A) 01/27/2016 1730   PROTEINUR NEGATIVE 01/27/2016 1730   UROBILINOGEN 0.2 10/22/2014 2136   NITRITE NEGATIVE 01/27/2016 1730   LEUKOCYTESUR NEGATIVE 01/27/2016 1730   Sepsis Labs: @LABRCNTIP (procalcitonin:4,lacticidven:4)  ) Recent Results (from the past 240 hour(s))  Body fluid culture     Status: Abnormal   Collection Time: 09/03/17  3:54 PM  Result Value Ref Range Status   Specimen Description   Final    PLEURAL RIGHT Performed at [facility] [place] [address], [place], [place] [address]    Special Requests   Final    NONE Performed at [facility], [place] [address], [place], [place] [address]    Gram Stain   Final    WBC PRESENT,BOTH PMN AND MONONUCLEAR NO ORGANISMS SEEN CYTOSPIN SMEAR    Culture (A)  Final    FUNGUS (MOLD) ISOLATED, PROBABLE CONTAMINANT/COLONIZER (SAPROPHYTE). CONTACT MICROBIOLOGY IF FURTHER IDENTIFICATION REQUIRED [id number]. CRITICAL RESULT CALLED TO, READ BACK BY AND VERIFIED WITH: Cannon Kettle RN, AT [phone] 09/06/17 BY D. VANHOOK REGARDING CULTURE GROWTH Performed at [facility], [place] [address] [place], [place] [address]    Report Status 09/07/2017 FINAL  Final  Surgical pcr screen     Status: None   Collection Time: 09/05/17  8:26 AM  Result Value Ref Range Status   MRSA, PCR NEGATIVE NEGATIVE Final   Staphylococcus aureus NEGATIVE NEGATIVE Final    Comment: (NOTE) The Xpert SA Assay (FDA approved for NASAL specimens in patients 76 years of age and older), is one component of a comprehensive surveillance program. It is not  intended to diagnose infection nor to guide or monitor treatment. Performed at [facility], [place] [address], [place], [place] [address]   Culture, blood (routine x 2)     Status: None (Preliminary result)   Collection Time: 09/07/17  4:34 PM  Result Value Ref Range Status   Specimen Description   Final  BLOOD LEFT ANTECUBITAL Performed at [facility] [address] [place], [place] [address]    Special Requests   Final    BOTTLES DRAWN AEROBIC AND ANAEROBIC Blood Culture adequate volume Performed at [facility] [address], [place], [place] [address]    Culture   Final    NO GROWTH < 24 HOURS Performed at [facility] [address] [place], [place] [address]    Report Status PENDING  Incomplete  Culture, blood (routine x 2)     Status: None (Preliminary result)   Collection Time: 09/07/17  4:40 PM  Result Value Ref Range Status   Specimen Description   Final    BLOOD BLOOD RIGHT HAND Performed at [facility] [address], [place], [place] [address]    Special Requests   Final    BOTTLES DRAWN AEROBIC AND ANAEROBIC Blood Culture adequate volume Performed at [facility] [address], [place], [place] [address]    Culture   Final    NO GROWTH < 24 HOURS Performed at [facility] [address] [place],  [address]    Report Status PENDING  Incomplete      Studies: Ct Head Wo Contrast  Result Date: 09/08/2017 CLINICAL DATA:  Altered level of consciousness EXAM: CT HEAD WITHOUT CONTRAST TECHNIQUE: Contiguous axial images were obtained from the base of the skull through the vertex without intravenous contrast. COMPARISON:  01/27/16 FINDINGS: Brain: Mild atrophic changes and chronic white matter ischemic change is seen. No findings to suggest acute hemorrhage, acute infarction or space-occupying mass lesion are noted. Vascular: No hyperdense vessel  or unexpected calcification. Skull: Normal. Negative for fracture or focal lesion. Sinuses/Orbits: No acute finding. Other: None. IMPRESSION: Chronic atrophic and ischemic changes. No acute abnormality is seen. Electronically Signed   By: Inez Catalina M.D.   On: 09/08/2017 09:34   Dg Chest Port 1 View  Result Date: 09/08/2017 CLINICAL DATA:  Follow-up pneumonia EXAM: PORTABLE CHEST 1 VIEW COMPARISON:  09/07/2017 FINDINGS: Cardiac shadow is enlarged but stable. Aortic calcifications are again seen. Postsurgical changes are noted. Persistent left basilar infiltrate and effusion is seen. The right lung is stable. IMPRESSION: Stable changes in the left base. Electronically Signed   By: Inez Catalina M.D.   On: 09/08/2017 09:03    Scheduled Meds: . alvimopan  12 mg Oral BID  . buPROPion  150 mg Oral Daily  . citalopram  20 mg Oral Daily  . diltiazem  120 mg Oral Daily  . enoxaparin (LOVENOX) injection  40 mg Subcutaneous Q24H  . feeding supplement  1 Container Oral TID WC  . feeding supplement  237 mL Oral BID BM  . furosemide  80 mg Oral Daily  . guaiFENesin  1,200 mg Oral BID  . insulin aspart  0-15 Units Subcutaneous TID WC  . insulin aspart  0-5 Units Subcutaneous QHS  . ipratropium  0.5 mg Nebulization BID  . iron polysaccharides  150 mg Oral BID  . levalbuterol  0.63 mg Nebulization BID  . mouth rinse  15 mL Mouth Rinse BID  . pantoprazole  40 mg Oral BID  . potassium chloride SA  20 mEq Oral Daily  . pravastatin  20 mg Oral q1800  . saccharomyces boulardii  250 mg Oral BID  . temazepam  15 mg Oral QHS    Continuous Infusions: . aztreonam Stopped (09/08/17 1027)  . lactated ringers Stopped (09/08/17 1230)  . [  START ON 09/09/2017] vancomycin       LOS: 9 days     Alma Friendly, MD [facility]   If 7PM-7AM, please contact night-coverage [email or web address] Password [email or web address] 09/08/2017, 5:12 PM

## 2017-09-08 NOTE — Progress Notes (Signed)
This Rn was paged to pts room by [place]. Pt was hard to arouse from sleep and would drift back asleep after opening an eye. MD paged and ICU asked to come assess patient. Vitals and CBG taken and documented in chart. Pts speech was very garbled and unclear. Pt remains orientated to person, place, and time. Pts bedside neuro exam by PA was unremarkable. Orders placed and carried out. Pt transferred by ICU nurse to ICU for closer observation.

## 2017-09-08 NOTE — Progress Notes (Signed)
Patient ID: Joel [name], male   DOB: December 23, 1932, 84 y.o.   MRN: [id number]    3 Days Post-Op  Subjective: Upon my arrival to the patient's room this morning around 0800am, the patient was sitting up in his chair and appeared to be sleeping.  He was very difficult to arouse with very dense speech.  RN's presence was requested which she states was a change in his status just from about 10 minutes prior when getting report for him.  He tried to talk initially and we could not understand him only when aroused by shaking or very loud voice.  He was oriented as he also knew where he was and what year it was, but would instantly fall back asleep.  He complained of no pain anywhere except some abdominal pain from his surgery. No pain meds since sometime yesterday.  Last meds were his routine home meds at 2200 last night.   Objective: Vital signs in last 24 hours: Temp:  [97.7 F (36.5 C)-98.4 F (36.9 C)] 98.4 F (36.9 C) (08/08 0500) Pulse Rate:  [68-100] 95 (08/08 0805) Resp:  [17-22] 22 (08/08 0500) BP: (119-140)/(63-80) 140/80 (08/08 0805) SpO2:  [91 %-100 %] 91 % (08/08 0805) Weight:  [107.7 kg] 107.7 kg (08/08 0500) Last BM Date: (before surgery )  Intake/Output from previous day: 08/07 0701 - 08/08 0700 In: 1556.7 [I.V.:1485.9; IV Piggyback:70.8] Out: -  Intake/Output this shift: No intake/output data recorded.  PE: Gen: somnolent, difficult to arouse Heart: irregularly irregular Lungs: CTAB Abd: soft, some distention, but +BS, midline incision with honeycomb dressing in place and some old bloody drainage.  Other incisions are c/d/i Neuro: PERRL, EOMI, slight left sided droop when smiling, but when repeated later this was improved.  Tongue is midline, good shoulder shrug strength, good eyelid strength, normal grimace, upper arm strength equal bilaterally, able to hold his arms up in the air with no issues.  Normal strength in his legs.  No deficits noted with his neuro exam except  very dense speech, which improved after about 20 minutes.   Lab Results:  Recent Labs    09/07/17 0328 09/08/17 0540  WBC 6.9 5.6  HGB 8.2* 8.1*  HCT 26.7* 26.1*  PLT 162 195   BMET Recent Labs    09/07/17 0620 09/08/17 0540  NA 139 139  K 4.0 3.6  CL 102 104  CO2 29 26  GLUCOSE 164* 137*  BUN 31* 26*  CREATININE 1.96* 1.48*  CALCIUM 9.0 9.0   PT/INR No results for input(s): LABPROT, INR in the last 72 hours. CMP     Component Value Date/Time   NA 139 09/08/2017 0540   K 3.6 09/08/2017 0540   CL 104 09/08/2017 0540   CO2 26 09/08/2017 0540   GLUCOSE 137 (H) 09/08/2017 0540   BUN 26 (H) 09/08/2017 0540   CREATININE 1.48 (H) 09/08/2017 0540   CREATININE 1.38 (H) 03/17/2017 1458   CALCIUM 9.0 09/08/2017 0540   PROT 6.7 09/06/2017 0318   ALBUMIN 3.4 (L) 09/06/2017 0318   AST 15 09/06/2017 0318   AST 15 03/17/2017 1458   ALT 11 09/06/2017 0318   ALT 12 03/17/2017 1458   ALKPHOS 53 09/06/2017 0318   BILITOT 0.7 09/06/2017 0318   BILITOT 0.4 03/17/2017 1458   GFRNONAA 42 (L) 09/08/2017 0540   GFRNONAA 46 (L) 03/17/2017 1458   GFRAA 49 (L) 09/08/2017 0540   GFRAA 53 (L) 03/17/2017 1458   Lipase  No results found for:  LIPASE     Studies/Results: Dg Chest 1 View  Result Date: 09/07/2017 CLINICAL DATA:  Pleural effusion EXAM: CHEST  1 VIEW COMPARISON:  September 04, 2017 FINDINGS: There is airspace consolidation in the left lower lobe with left pleural effusion. There is a minimal right pleural effusion. There is mild atelectasis in the medial right base. There is cardiomegaly with pulmonary vascularity within normal limits. There is aortic atherosclerosis. No adenopathy. Patient is status post median sternotomy. No evident bone lesions. IMPRESSION: New airspace consolidation left lower lobe with left pleural effusion. Minimal right pleural effusion. Atelectasis medial right base. Stable cardiomegaly.  There is aortic atherosclerosis. Aortic Atherosclerosis  (ICD10-I70.0). Electronically Signed   By: Lowella Grip III M.D.   On: 09/07/2017 07:00   Dg Chest Port 1 View  Result Date: 09/08/2017 CLINICAL DATA:  Follow-up pneumonia EXAM: PORTABLE CHEST 1 VIEW COMPARISON:  09/07/2017 FINDINGS: Cardiac shadow is enlarged but stable. Aortic calcifications are again seen. Postsurgical changes are noted. Persistent left basilar infiltrate and effusion is seen. The right lung is stable. IMPRESSION: Stable changes in the left base. Electronically Signed   By: Inez Catalina M.D.   On: 09/08/2017 09:03    Anti-infectives: Anti-infectives (From admission, onward)   Start     Dose/Rate Route Frequency Ordered Stop   09/08/17 0100  aztreonam (AZACTAM) 2 g in sodium chloride 0.9 % 100 mL IVPB     2 g 200 mL/hr over 30 Minutes Intravenous Every 8 hours 09/07/17 1654     09/07/17 1645  aztreonam (AZACTAM) 2 g in sodium chloride 0.9 % 100 mL IVPB     2 g 200 mL/hr over 30 Minutes Intravenous STAT 09/07/17 1633 09/07/17 1853   09/05/17 1800  clindamycin (CLEOCIN) IVPB 900 mg     900 mg 100 mL/hr over 30 Minutes Intravenous Every 8 hours 09/05/17 1320 09/05/17 1826   09/05/17 0815  gentamicin (GARAMYCIN) 450 mg in dextrose 5 % 100 mL IVPB     5 mg/kg  90.1 kg (Adjusted) 111.3 mL/hr over 60 Minutes Intravenous To Short Stay 09/05/17 0749 09/05/17 1101   09/05/17 0748  clindamycin (CLEOCIN) IVPB 900 mg     900 mg 100 mL/hr over 30 Minutes Intravenous 60 min pre-op 09/05/17 0749 09/05/17 1006   08/31/17 1100  fluconazole (DIFLUCAN) tablet 100 mg     100 mg Oral Daily 08/31/17 0921 09/02/17 1726       Assessment/Plan  POD #3.Laparoscopic right colectomy for cancer -made NPO for now until assessment can be done to rule out CVA -path is node negative, otherwise invasive colorectal adenoca  Elevated creatinine and BUN.   -improved, Cr down to 1.48 today  Atrial fibrillation-controlled at rate of 79-96.On Cardizem. Cardiology recommends Cardizem CD  120 mg daily.  When able, start apixaban 5 mg twice daily.  Restart losartan once BP more stable.  Follow-up with Dr. Minus Breeding in 2 weeks.  AMS -patient apparently didn't wear his BiPap overnight and is not on O2 when I came in.  ABG checked to rule out hypercarbia, which this was normal. -CT head ordered to rule out CVA or some other event -tx back down to SDU for closer monitoring  Pulmonary hypertension COPD HTN -Cardizem.  Restart losartan for BP more stable. OSA on BiPAP.-Continue BiPAP per respiratory therapy Moderate aortic stenosis -yearly echo recommended Pleural effusion-status post thoracentesis preop. Chest x-ray shows mild pulmonary congestion but no recurrence of effusion.  On Lasix per [facility]. Acute on chronic  blood loss anemia.-No evidence of active bleeding Hypertension. Medication regimen per cardiology and [facility] DM 2 complicated by diabetic neuropathy in feet.-See recommendations by [facility]  FEN - NPO for now, if workup negative, then can resume clears VTE - SCDs/Lovenox, can likely start eliquis once CT head completed ID - none currently   LOS: 9 days    Henreitta Cea , [facility] 09/08/2017, 9:16 AM Pager: [phone]

## 2017-09-08 NOTE — Care Management Note (Signed)
Case Management Note  Patient Details  Name: Joel [name] MRN: [id number] Date of Birth: 21-Dec-1932  Subjective/Objective:     Transferred back to icu after loc changed from alert to insomlence, speech garbled , place of 02 at 2l/min, telemetry, Iv flds, Iv azactam and iv vancomycin.               Action/Plan:  Will follow for hhc needs and changes in conditions.   Expected Discharge Date:  (unknown)               Expected Discharge Plan:     In-House Referral:     Discharge planning Services     Post Acute Care Choice:    Choice offered to:     DME Arranged:    DME Agency:     HH Arranged:    HH Agency:     Status of Service:     If discussed at [employer], dates discussed:    Additional Comments:  Leeroy Cha, RN 09/08/2017, 10:58 AM

## 2017-09-08 NOTE — Consult Note (Signed)
[place] Nurse wound consult note Reason for Consult: requested while in the ICU to assist with 2 person skin assessment upon transfer from the floor  Wound type: Deep tissue pressure injury; medical device related, mid back  Stage 2 pressure injury: right medial heel Deep tissue pressure injury: left medial heel  Stage 2 pressure injury: sacrum  Pressure Injury POA: No Measurement: see nursing flow sheets Wound bed: Right heel serous filled intact blister Left heel dark purple non blanchable skin, intact Sacrum: 100% yellow with epithelial buds Mid back: dark purple linear area with blistering consistent with some type of tubing under the patient.  Drainage (amount, consistency, odor) see nursing notes  Periwound: intact  Dressing procedure/placement/frequency: ICU Progressa bed for moisture management and pressure redistribution Foam dressings to protect back wounds, heel wounds Prevalon boots to offload bilateral heels. Sacral foam to protect wound  Assess under foam dressings each shift for acute changes. Especially back wound that may evolve to eschar.  [place] Nurse team will follow along with you for weekly wound assessments.  Please notify me of any acute changes in the wounds or any new areas of concerns [place] MSN, RN,CWOCN, [place], [place]

## 2017-09-08 NOTE — Progress Notes (Addendum)
ANTICOAGULATION CONSULT NOTE - Initial Consult  Pharmacy Consult for Apixaban  Indication: atrial fibrillation  Allergies  Allergen Reactions  . Penicillins Anaphylaxis    Has patient had a PCN reaction causing immediate rash, facial/tongue/throat swelling, SOB or lightheadedness with hypotension: unknown Has patient had a PCN reaction causing severe rash involving mucus membranes or skin necrosis: unknown Has patient had a PCN reaction that required hospitalization: unknown Has patient had a PCN reaction occurring within the last 10 years: no If all of the above answers are "NO", then may proceed with Cephalosporin use.   Marland Kitchen Antihistamines, Loratadine-Type Other (See Comments)    depression  . Other     Beta blockers-Novacaine  Caused depression    Patient Measurements: Height: 6' (182.9 cm) Weight: 237 lb 8 oz (107.7 kg) IBW/kg (Calculated) : 77.6  Vital Signs: Temp: 97.4 F (36.3 C) (08/08 1600) Temp Source: Axillary (08/08 1600) BP: 132/51 (08/08 1700) Pulse Rate: 76 (08/08 1700)  Labs: Recent Labs    09/06/17 0318 09/07/17 0328 09/07/17 0620 09/08/17 0540  HGB 8.8* 8.2*  --  8.1*  HCT 28.7* 26.7*  --  26.1*  PLT 202 162  --  195  CREATININE 1.61* 2.18* 1.96* 1.48*    Estimated Creatinine Clearance: 48.8 mL/min (A) (by C-G formula based on SCr of 1.48 mg/dL (H)).   Medical History: Past Medical History:  Diagnosis Date  . Allergic rhinitis   . Anemia   . Anxiety   . Aortic stenosis 2012   mild to mod   . Atrial fibrillation ([place]) 1991   with multiple DCCV  . Bipolar affective disorder ([place])   . Cancer of ascending colon ([place]) 09/05/2017  . CHF (congestive heart failure) ([place]) 08/30/2017  . COPD (chronic obstructive pulmonary disease) ([place])   . Diabetic neuropathy ([place])    in feet  . Diabetic neuropathy ([place])   . Dyslipidemia   . Fatigue    last year or so  . Gout   . Hypertension   . Insomnia   . Obesity   . On home oxygen therapy 2 1/2 liters  with bipap at night  . OSA (obstructive sleep apnea)    severe, uses bipap 16 1/2 by 12 or 13 setting  . Prostate cancer ([place])   . Rectal bleeding 12/16/2015  . Type 2 diabetes mellitus ([place])   . Vertigo     Medications:  Scheduled:  . alvimopan  12 mg Oral BID  . buPROPion  150 mg Oral Daily  . citalopram  20 mg Oral Daily  . diltiazem  120 mg Oral Daily  . enoxaparin (LOVENOX) injection  40 mg Subcutaneous Q24H  . feeding supplement  1 Container Oral TID WC  . feeding supplement  237 mL Oral BID BM  . furosemide  80 mg Oral Daily  . guaiFENesin  1,200 mg Oral BID  . insulin aspart  0-15 Units Subcutaneous TID WC  . insulin aspart  0-5 Units Subcutaneous QHS  . ipratropium  0.5 mg Nebulization BID  . iron polysaccharides  150 mg Oral BID  . levalbuterol  0.63 mg Nebulization BID  . mouth rinse  15 mL Mouth Rinse BID  . pantoprazole  40 mg Oral BID  . potassium chloride SA  20 mEq Oral Daily  . pravastatin  20 mg Oral q1800  . saccharomyces boulardii  250 mg Oral BID  . temazepam  15 mg Oral QHS   Infusions:  . aztreonam Stopped (09/08/17 1719)  .  lactated ringers Stopped (09/08/17 1230)  . [START ON 09/09/2017] vancomycin      Assessment: 50 yoM admitted on 7/30 with anemia, long history of GI bleeding, s/p colectomy on 09/05/17 for cancer.  Also has Afib, not previously on chronic anticoagulation d/t GI bleeding.  Cardiology now recommends Eliquis.  Per [facility], "Okay to restart Eliquis if there is no intracranial bleeding"    SCr 1.48 (decreasing to near baseline SCr < 1.5) CBC:  Hgb remains low/stable 8.1, Plt 195  Goal of Therapy:  Monitor platelets by anticoagulation protocol: Yes   Plan:   Eliquis 5 mg PO BID  D/C Lovenox 40mg  [place] daily  Pharmacy will sign off note writing at this time, but will follow peripherally for renal dosing.  Please reconsult if a change in clinical status warrants re-evaluation of dosage.   Gretta Arab PharmD, [place] Pager  [phone] 09/08/2017,5:30 PM

## 2017-09-08 NOTE — Progress Notes (Signed)
Physical Therapy Treatment Patient Details Name: Joel [name] MRN: [id number] DOB: 04-Sep-1932 Today's Date: 09/08/2017    History of Present Illness This 84 y.o. male is s/p laparoscopic R colectomy and repair of umbilical due to colon CA.  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 Surgical history of R hip cannulated pinning in 03/2017, R shoulder surgery in 2011/2012, and electrotherapy for depression in 1969.     PT Comments    Pt in bed on 2 lts at 98%, HR 84, RR 21, BP 158/54(93) Assisted to EOB RA 90%, HR 87, BP 166/88(119) Assisted OOB to BSC to void/poss BM but non.  Pt required increased time and rest break between each activity.  Assisted with amb a greater distance in hallway however required + 2 assist and use of B platform EVA walker for support. Pt is motivated and wants to D/C to home vs.      Follow Up Recommendations  Home health PT     Equipment Recommendations  None recommended by PT    Recommendations for Other Services       Precautions / Restrictions Precautions Precautions: Fall Precaution Comments: recent ABD surgery Restrictions Weight Bearing Restrictions: No    Mobility  Bed Mobility Overal bed mobility: Needs Assistance Bed Mobility: Supine to Sit     Supine to sit: Min assist;Mod assist     General bed mobility comments: HOB raised completely and pt used bedrails on bil sides due to distended ABD  Transfers Overall transfer level: Needs assistance Equipment used: Rolling walker (2 wheeled) Transfers: Risk manager;Sit to/from Stand Sit to Stand: Min assist;+2 safety/equipment Stand pivot transfers: Min assist;+2 safety/equipment       General transfer comment: assist to rise and steady from elevated bed to [facility] then up again to amb  Ambulation/Gait Ambulation/Gait assistance: Min assist Gait Distance (Feet): 50 Feet Assistive device: Bilateral platform walker Gait  Pattern/deviations: Step-to pattern;Decreased stance time - right Gait velocity: decreased    General Gait Details: used EVA walker for increased support.  Pt c/o R heel pain.  Tolerated a greater distance on RA avg 91% and HR 89   + 2 assist for safety/equipment/recliner to follow   Stairs             Wheelchair Mobility    Modified Rankin (Stroke Patients Only)       Balance                                            Cognition Arousal/Alertness: Awake/alert Behavior During Therapy: WFL for tasks assessed/performed Overall Cognitive Status: Within Functional Limits for tasks assessed                                 General Comments: pleasant      Exercises      General Comments        Pertinent Vitals/Pain Pain Assessment: Faces Pain Location: R heel when walking Pain Descriptors / Indicators: Sore Pain Intervention(s): Monitored during session    Home Living                      Prior Function            PT Goals (current goals can now  be found in the care plan section) Progress towards PT goals: Progressing toward goals    Frequency    Min 3X/week      PT Plan Current plan remains appropriate    Co-evaluation              AM-PAC PT "6 Clicks" Daily Activity  Outcome Measure  Difficulty turning over in bed (including adjusting bedclothes, sheets and blankets)?: A Lot Difficulty moving from lying on back to sitting on the side of the bed? : A Lot Difficulty sitting down on and standing up from a chair with arms (e.g., wheelchair, bedside commode, etc,.)?: A Lot Help needed moving to and from a bed to chair (including a wheelchair)?: A Lot Help needed walking in hospital room?: A Lot Help needed climbing 3-5 steps with a railing? : Total 6 Click Score: 11    End of Session Equipment Utilized During Treatment: Gait belt Activity Tolerance: Patient limited by fatigue Patient left: in  chair;with call bell/phone within reach Nurse Communication: Mobility status PT Visit Diagnosis: Unsteadiness on feet (R26.81);Other abnormalities of gait and mobility (R26.89)     Time: 5364-6803 PT Time Calculation (min) (ACUTE ONLY): 44 min  Charges:  $Gait Training: 8-22 mins $Therapeutic Activity: 23-37 mins                     {Tanganika Barradas  PTA [facility]  Acute  Rehab Pager      [phone]

## 2017-09-09 LAB — CBC WITH DIFFERENTIAL/PLATELET
BASOS ABS: 0 10*3/uL (ref 0.0–0.1)
BASOS PCT: 0 %
Basophils Absolute: 0 10*3/uL (ref 0.0–0.1)
Basophils Absolute: 0 10*3/uL (ref 0.0–0.1)
Basophils Relative: 0 %
Basophils Relative: 0 %
EOS PCT: 3 %
EOS PCT: 3 %
Eosinophils Absolute: 0.2 10*3/uL (ref 0.0–0.7)
Eosinophils Absolute: 0.2 10*3/uL (ref 0.0–0.7)
Eosinophils Absolute: 0.1 10*3/uL (ref 0.0–0.7)
Eosinophils Relative: 3 %
HCT: 27.2 % — ABNORMAL LOW (ref 39.0–52.0)
HEMATOCRIT: 23.7 % — AB (ref 39.0–52.0)
HEMATOCRIT: 25.7 % — AB (ref 39.0–52.0)
HEMOGLOBIN: 9.1 g/dL — AB (ref 13.0–17.0)
Hemoglobin: 7.3 g/dL — ABNORMAL LOW (ref 13.0–17.0)
Hemoglobin: 8.5 g/dL — ABNORMAL LOW (ref 13.0–17.0)
LYMPHS ABS: 0.7 10*3/uL (ref 0.7–4.0)
LYMPHS ABS: 0.5 10*3/uL — AB (ref 0.7–4.0)
LYMPHS PCT: 14 %
LYMPHS PCT: 11 %
Lymphocytes Relative: 6 %
Lymphs Abs: 0.3 10*3/uL — ABNORMAL LOW (ref 0.7–4.0)
MCH: 28.5 pg (ref 26.0–34.0)
MCH: 29.9 pg (ref 26.0–34.0)
MCH: 30.1 pg (ref 26.0–34.0)
MCHC: 30.8 g/dL (ref 30.0–36.0)
MCHC: 33.1 g/dL (ref 30.0–36.0)
MCHC: 33.5 g/dL (ref 30.0–36.0)
MCV: 92.6 fL (ref 78.0–100.0)
MCV: 90.5 fL (ref 78.0–100.0)
MCV: 90.1 fL (ref 78.0–100.0)
MONO ABS: 0.6 10*3/uL (ref 0.1–1.0)
MONO ABS: 0.5 10*3/uL (ref 0.1–1.0)
MONOS PCT: 11 %
Monocytes Absolute: 0.7 10*3/uL (ref 0.1–1.0)
Monocytes Relative: 10 %
Monocytes Relative: 14 %
NEUTROS ABS: 3.9 10*3/uL (ref 1.7–7.7)
NEUTROS ABS: 3.8 10*3/uL (ref 1.7–7.7)
NEUTROS PCT: 72 %
Neutro Abs: 3.3 10*3/uL (ref 1.7–7.7)
Neutrophils Relative %: 80 %
Neutrophils Relative %: 73 %
PLATELETS: 211 10*3/uL (ref 150–400)
PLATELETS: 181 10*3/uL (ref 150–400)
Platelets: 201 10*3/uL (ref 150–400)
RBC: 2.56 MIL/uL — ABNORMAL LOW (ref 4.22–5.81)
RBC: 2.84 MIL/uL — AB (ref 4.22–5.81)
RBC: 3.02 MIL/uL — AB (ref 4.22–5.81)
RDW: 15.8 % — AB (ref 11.5–15.5)
RDW: 15.3 % (ref 11.5–15.5)
RDW: 15.1 % (ref 11.5–15.5)
WBC: 4.9 10*3/uL (ref 4.0–10.5)
WBC: 5.2 10*3/uL (ref 4.0–10.5)
WBC: 4.6 10*3/uL (ref 4.0–10.5)

## 2017-09-09 LAB — CBC
HEMATOCRIT: 24.9 % — AB (ref 39.0–52.0)
Hemoglobin: 8.3 g/dL — ABNORMAL LOW (ref 13.0–17.0)
MCH: 30 pg (ref 26.0–34.0)
MCHC: 33.3 g/dL (ref 30.0–36.0)
MCV: 89.9 fL (ref 78.0–100.0)
Platelets: 166 10*3/uL (ref 150–400)
RBC: 2.77 MIL/uL — ABNORMAL LOW (ref 4.22–5.81)
RDW: 15 % (ref 11.5–15.5)
WBC: 5.3 10*3/uL (ref 4.0–10.5)

## 2017-09-09 LAB — BASIC METABOLIC PANEL
Anion gap: 8 (ref 5–15)
Anion gap: 6 (ref 5–15)
BUN: 22 mg/dL (ref 8–23)
BUN: 19 mg/dL (ref 8–23)
CALCIUM: 8.5 mg/dL — AB (ref 8.9–10.3)
CALCIUM: 8 mg/dL — AB (ref 8.9–10.3)
CHLORIDE: 107 mmol/L (ref 98–111)
CO2: 28 mmol/L (ref 22–32)
CO2: 28 mmol/L (ref 22–32)
CREATININE: 1.24 mg/dL (ref 0.61–1.24)
Chloride: 106 mmol/L (ref 98–111)
Creatinine, Ser: 1.16 mg/dL (ref 0.61–1.24)
GFR calc non Af Amer: 52 mL/min — ABNORMAL LOW (ref >60)
GFR calc non Af Amer: 57 mL/min — ABNORMAL LOW (ref >60)
GLUCOSE: 134 mg/dL — AB (ref 70–99)
Glucose, Bld: 151 mg/dL — ABNORMAL HIGH (ref 70–99)
Potassium: 3.3 mmol/L — ABNORMAL LOW (ref 3.5–5.1)
Potassium: 3.5 mmol/L (ref 3.5–5.1)
Sodium: 142 mmol/L (ref 135–145)
Sodium: 141 mmol/L (ref 135–145)

## 2017-09-09 LAB — POCT I-STAT, CHEM 8
BUN: 16 mg/dL (ref 8–23)
CHLORIDE: 101 mmol/L (ref 98–111)
CREATININE: 1.1 mg/dL (ref 0.61–1.24)
Calcium, Ion: 1.18 mmol/L (ref 1.15–1.40)
GLUCOSE: 139 mg/dL — AB (ref 70–99)
HEMATOCRIT: 24 % — AB (ref 39.0–52.0)
Hemoglobin: 8.2 g/dL — ABNORMAL LOW (ref 13.0–17.0)
POTASSIUM: 3.4 mmol/L — AB (ref 3.5–5.1)
Sodium: 142 mmol/L (ref 135–145)
TCO2: 25 mmol/L (ref 22–32)

## 2017-09-09 LAB — GLUCOSE, CAPILLARY
GLUCOSE-CAPILLARY: 115 mg/dL — AB (ref 70–99)
GLUCOSE-CAPILLARY: 133 mg/dL — AB (ref 70–99)
Glucose-Capillary: 149 mg/dL — ABNORMAL HIGH (ref 70–99)
Glucose-Capillary: 136 mg/dL — ABNORMAL HIGH (ref 70–99)

## 2017-09-09 LAB — APTT: aPTT: 39 seconds — ABNORMAL HIGH (ref 24–36)

## 2017-09-09 LAB — HEMOGLOBIN AND HEMATOCRIT, BLOOD
HCT: 20.8 % — ABNORMAL LOW (ref 39.0–52.0)
Hemoglobin: 6.6 g/dL — CL (ref 13.0–17.0)

## 2017-09-09 LAB — PREPARE RBC (CROSSMATCH)

## 2017-09-09 LAB — PROTIME-INR
INR: 1.27
Prothrombin Time: 15.8 seconds — ABNORMAL HIGH (ref 11.4–15.2)

## 2017-09-09 MED ORDER — POTASSIUM CHLORIDE CRYS ER 20 MEQ PO TBCR
40.0000 meq | EXTENDED_RELEASE_TABLET | Freq: Two times a day (BID) | ORAL | Status: AC
  Administered 2017-09-09 – 2017-09-10 (×3): 40 meq via ORAL
  Filled 2017-09-09 (×3): qty 2

## 2017-09-09 MED ORDER — SODIUM CHLORIDE 0.9% IV SOLUTION
Freq: Once | INTRAVENOUS | Status: AC
  Administered 2017-09-09: 10 mL/h via INTRAVENOUS

## 2017-09-09 MED ORDER — SODIUM CHLORIDE 0.9% IV SOLUTION: Freq: Once | INTRAVENOUS | Status: AC

## 2017-09-09 MED ORDER — SODIUM CHLORIDE 0.9 % IV SOLN
INTRAVENOUS | Status: DC
  Administered 2017-09-09 (×2): via INTRAVENOUS
  Administered 2017-09-09: 75 mL/h via INTRAVENOUS
  Administered 2017-09-10 – 2017-09-13 (×6): via INTRAVENOUS

## 2017-09-09 MED ORDER — PROTHROMBIN COMPLEX CONC HUMAN 500 UNITS IV KIT
2648.0000 [IU] | PACK | Status: AC
  Administered 2017-09-09: 2648 [IU] via INTRAVENOUS
  Filled 2017-09-09: qty 648

## 2017-09-09 MED ORDER — VANCOMYCIN HCL 10 G IV SOLR
1250.0000 mg | INTRAVENOUS | Status: DC
  Administered 2017-09-09: 1250 mg via INTRAVENOUS
  Filled 2017-09-09 (×2): qty 1250

## 2017-09-09 NOTE — Care Management Note (Signed)
Case Management Note  Patient Details  Name: Joel [name] MRN: [id number] Date of Birth: 1932-03-06  Subjective/Objective:      abnormal labs and hypoxia/ using bipap at hs/o2 during awake times/hgb 6.6 this am from 7.2/              Action/Plan: iv ns and iv azactam and iv vancomycin/ Will continue to follow for cm and dc needs plan is to return home at this time/  Expected Discharge Date:  (unknown)               Expected Discharge Plan:  Home/Self Care  In-House Referral:     Discharge planning Services  CM Consult  Post Acute Care Choice:    Choice offered to:     DME Arranged:    DME Agency:     HH Arranged:    HH Agency:     Status of Service:  In process, will continue to follow  If discussed at Long Length of Stay Meetings, dates discussed:    Additional Comments:  Leeroy Cha, RN 09/09/2017, 10:31 AM

## 2017-09-09 NOTE — Progress Notes (Signed)
4 Days Post-Op  Subjective: Had episode of lethargy yesterday This has resolved and he is alert and oriented and knows my name and understands why he is here in the hospital ABG showed normal oxygenation and CO2 exchange.  He was not hypercarbic CT brain showed no acute abnormality He is simply being monitored in stepdown  Eliquis has been started.  He continues on oral Cardizem for atrial fibrillation.  He had a bowel movement which had old blood in it.  He is still little distended but is hungry Afebrile.  Heart rate 83.  BP 146/76.  SPO2 99% Good urine output.  2 stools recorded  Hemoglobin has drifted down to 7.3.  WBC 4900.  Potassium 3.3.  Creatinine 1.24.  Pathology report shows a 5.3 cm colon cancer.  Margins negative.  14 lymph nodes are negative.  Stage T3N0 Would offer medical oncology consultation as an outpatient, but doubt that he would qualify for any adjuvant therapy given age and comorbidities  Objective: Vital signs in last 24 hours: Temp:  [97.4 F (36.3 C)-99.8 F (37.7 C)] 98.4 F (36.9 C) (08/09 0400) Pulse Rate:  [76-97] 83 (08/09 0323) Resp:  [13-29] 21 (08/09 0323) BP: (116-176)/(51-132) 146/76 (08/09 0400) SpO2:  [89 %-99 %] 99 % (08/09 0323) Weight:  [105.3 kg] 105.3 kg (08/09 0400) Last BM Date: (before surgery )  Intake/Output from previous day: 08/08 0701 - 08/09 0700 In: 1793.4 [P.O.:630; I.V.:461.1; IV Piggyback:702.3] Out: 2150 [Urine:2150] Intake/Output this shift: Total I/O In: 414.2 [P.O.:150; I.V.:164.2; IV Piggyback:100] Out: 550 [Urine:550]   EXAM:  General appearance: Alert.  Oriented to person place situation.  Knows my name very alert.  No distress Resp: clear to auscultation bilaterally GI: Abdomen is soft.  Wounds look fine.  Some bowel sounds present but still distended and a little tympanitic Neurologic: Grossly normal oriented x4.  Moves all 4 extremities to command.  No gross motor or sensory deficit  Lab Results:   Results for orders placed or performed during the hospital encounter of 08/30/17 (from the past 24 hour(s))  Glucose, capillary     Status: Abnormal   Collection Time: 09/08/17  7:40 AM  Result Value Ref Range   Glucose-Capillary 138 (H) 70 - 99 mg/dL  Blood gas, arterial     Status: None   Collection Time: 09/08/17  8:10 AM  Result Value Ref Range   O2 Content 2.0 L/min   Delivery systems NASAL CANNULA    pH, Arterial 7.429 7.350 - 7.450   pCO2 arterial 39.3 32.0 - 48.0 mmHg   pO2, Arterial 92.0 83.0 - 108.0 mmHg   Bicarbonate 25.5 20.0 - 28.0 mmol/L   Acid-Base Excess 1.6 0.0 - 2.0 mmol/L   O2 Saturation 97.5 %   Patient temperature 98.6    Collection site LEFT RADIAL    Drawn by [id number]    Sample type ARTERIAL DRAW    Allens test (pass/fail) PASS PASS  Glucose, capillary     Status: Abnormal   Collection Time: 09/08/17 11:46 AM  Result Value Ref Range   Glucose-Capillary 135 (H) 70 - 99 mg/dL  Strep pneumoniae urinary antigen     Status: None   Collection Time: 09/08/17  2:04 PM  Result Value Ref Range   Strep Pneumo Urinary Antigen NEGATIVE NEGATIVE  Glucose, capillary     Status: Abnormal   Collection Time: 09/08/17  3:27 PM  Result Value Ref Range   Glucose-Capillary 174 (H) 70 - 99 mg/dL  Glucose, capillary  Status: Abnormal   Collection Time: 09/08/17  9:27 PM  Result Value Ref Range   Glucose-Capillary 122 (H) 70 - 99 mg/dL   Comment 1 Notify RN    Comment 2 Document in Chart   CBC with Differential/Platelet     Status: Abnormal   Collection Time: 09/09/17  3:48 AM  Result Value Ref Range   WBC 4.9 4.0 - 10.5 K/uL   RBC 2.56 (L) 4.22 - 5.81 MIL/uL   Hemoglobin 7.3 (L) 13.0 - 17.0 g/dL   HCT 23.7 (L) 39.0 - 52.0 %   MCV 92.6 78.0 - 100.0 fL   MCH 28.5 26.0 - 34.0 pg   MCHC 30.8 30.0 - 36.0 g/dL   RDW 15.8 (H) 11.5 - 15.5 %   Platelets 201 150 - 400 K/uL   Neutrophils Relative % 80 %   Neutro Abs 3.9 1.7 - 7.7 K/uL   Lymphocytes Relative 6 %   Lymphs  Abs 0.3 (L) 0.7 - 4.0 K/uL   Monocytes Relative 11 %   Monocytes Absolute 0.6 0.1 - 1.0 K/uL   Eosinophils Relative 3 %   Eosinophils Absolute 0.2 0.0 - 0.7 K/uL   Basophils Relative 0 %   Basophils Absolute 0.0 0.0 - 0.1 K/uL  Basic metabolic panel     Status: Abnormal   Collection Time: 09/09/17  3:48 AM  Result Value Ref Range   Sodium 142 135 - 145 mmol/L   Potassium 3.3 (L) 3.5 - 5.1 mmol/L   Chloride 106 98 - 111 mmol/L   CO2 28 22 - 32 mmol/L   Glucose, Bld 134 (H) 70 - 99 mg/dL   BUN 22 8 - 23 mg/dL   Creatinine, Ser 1.24 0.61 - 1.24 mg/dL   Calcium 8.5 (L) 8.9 - 10.3 mg/dL   GFR calc non Af Amer 52 (L) >60 mL/min   GFR calc Af Amer >60 >60 mL/min   Anion gap 8 5 - 15     Studies/Results: Ct Head Wo Contrast  Result Date: 09/08/2017 CLINICAL DATA:  Altered level of consciousness EXAM: CT HEAD WITHOUT CONTRAST TECHNIQUE: Contiguous axial images were obtained from the base of the skull through the vertex without intravenous contrast. COMPARISON:  01/27/16 FINDINGS: Brain: Mild atrophic changes and chronic white matter ischemic change is seen. No findings to suggest acute hemorrhage, acute infarction or space-occupying mass lesion are noted. Vascular: No hyperdense vessel or unexpected calcification. Skull: Normal. Negative for fracture or focal lesion. Sinuses/Orbits: No acute finding. Other: None. IMPRESSION: Chronic atrophic and ischemic changes. No acute abnormality is seen. Electronically Signed   By: Inez Catalina M.D.   On: 09/08/2017 09:34   Dg Chest Port 1 View  Result Date: 09/08/2017 CLINICAL DATA:  Follow-up pneumonia EXAM: PORTABLE CHEST 1 VIEW COMPARISON:  09/07/2017 FINDINGS: Cardiac shadow is enlarged but stable. Aortic calcifications are again seen. Postsurgical changes are noted. Persistent left basilar infiltrate and effusion is seen. The right lung is stable. IMPRESSION: Stable changes in the left base. Electronically Signed   By: Inez Catalina M.D.   On:  09/08/2017 09:03    . alvimopan  12 mg Oral BID  . apixaban  5 mg Oral BID  . buPROPion  150 mg Oral Daily  . citalopram  20 mg Oral Daily  . diltiazem  120 mg Oral Daily  . feeding supplement  1 Container Oral TID WC  . feeding supplement  237 mL Oral BID BM  . furosemide  80 mg Oral Daily  .  guaiFENesin  1,200 mg Oral BID  . insulin aspart  0-15 Units Subcutaneous TID WC  . insulin aspart  0-5 Units Subcutaneous QHS  . ipratropium  0.5 mg Nebulization BID  . iron polysaccharides  150 mg Oral BID  . levalbuterol  0.63 mg Nebulization BID  . mouth rinse  15 mL Mouth Rinse BID  . pantoprazole  40 mg Oral BID  . potassium chloride SA  20 mEq Oral Daily  . pravastatin  20 mg Oral q1800  . saccharomyces boulardii  250 mg Oral BID  . temazepam  15 mg Oral QHS     Assessment/Plan: s/p Procedure(s): LAPAROSCOPIC RIGHT COLON RESECTION   POD #4.Laparoscopic right colectomy for cancer -Advance to full liquid diet but leave it there until abdominal distention improves -path is node negative, otherwise invasive colorectal adenoca, T3, N0 -There is no indication for antibiotics from general surgery standpoint -Resume physical therapy and ambulation -Okay to transfer back to floor from surgical standpoint  Acute blood loss anemia, superimposed on chronic blood loss anemia and chronic GI bleed -Hemoglobin 7.3 -Monitor closely.  He may need transfusion -Dark blood in his stool last night probably is left over from surgery.  Monitor closely  Elevated creatinine and BUN. -improved, Cr down to 1.24 today  Hypokalemia.  This could contribute to ileus.  Will supplement orally  Atrial fibrillation-controlled at rate of79-96.On Cardizem. Cardiologyrecommends Cardizem CD 120 mg daily. When able, start apixaban 5 mg twice daily(done). Restart losartan once BP more stable. Follow-up with Dr. Minus Breeding in 2 weeks.  AMS -ABG and CT brain unremarkable -I have discontinued  hydrocodone and Restoril with the thought that perhaps he was overly sedated -He is not having any significant pain  Pulmonary hypertension COPD HTN-Cardizem. Restart losartan for BP more stable. OSA on BiPAP.-Continue BiPAP per respiratory therapy Moderate aortic stenosis-yearly echo recommended Pleural effusion-status post thoracentesis preop. Chest x-rayshows mild pulmonary congestion but no recurrence of effusion.On Lasix per [facility] hospitalist. Acute on chronic blood loss anemia.-Monitor hemoglobin daily Hypertension. Medication regimen per cardiology and [facility] hospitalist DM 2 complicated by diabetic neuropathy in feet.-See recommendations by [facility] hospitalist  FEN - full liquid diet and hold until ileus resolves VTE - SCDs/Lovenox.  Eliquis has been started ID - on vancomycin currently.  Not indicated from surgical standpoint.  Question whether this is sepsis protocol  @PROBHOSP @  LOS: 10 days    Adin Hector 09/09/2017  . .prob

## 2017-09-09 NOTE — Progress Notes (Signed)
Surgery:  Patient has had several bloody bowel movements this morning He feels weak but no increase in abdominal pain Received Eliquis yesterday Hemoglobin 8.1 yesterday morning.  7.3 at 3:45 AM today.  6.6 at 8 AM today. PT and PTT pending  Hemodynamically stable Abdomen soft.  Somewhat distended.  Appropriate incisional tenderness Mental status normal  Has received 1 unit PRBC.  I have instructed nursing to give second unit of PRBC and 2 units FFP and K Centra by 3 PM today Check CBC after 2nd unit PRBC Blood bank keeping 4 units ahead   He has never had a GI bleed like this before Most likely this is an anastomotic bleed, now POD #4. This is unlikely to be from another source. Most likely this will stop with blood products and bowel rest However, if it does not he will need to return to the operating room for resection of his anastomosis.  I discussed this with him and he understands.   Joel [name]. Dalbert Batman, M.D., [facility] General and Minimally invasive Surgery Breast and Colorectal Surgery Office:   [phone] Pager:   [phone]

## 2017-09-09 NOTE — Progress Notes (Signed)
OT Cancellation Note  Patient Details Name: Joel [name] MRN: [id number] DOB: 12-25-1932   Cancelled Treatment:    Reason Eval/Treat Not Completed: Medical issues which prohibited therapy.  Pt's Hgb 6.6.  Will check another day.  Lashaunta Sicard 09/09/2017, 10:59 AM  Lesle Chris, OTR/L [phone] 09/09/2017

## 2017-09-09 NOTE — Progress Notes (Signed)
PROGRESS NOTE  Joel [name] IWL:[id number] DOB: 12/14/1932 DOA: 08/30/2017 PCP: Wenda Low, MD  HPI/Recap of past 74 hours: 84 year old male with medical history significant for CAD, A. fib not on AC due to recurrent GI bleed from possible gastric AVMs, diabetes, hypertension, COPD, OSA, CKD, diastolic CHF presents to the ED complaining of generalized weakness, with abdominal bloating.  In the ED hemoglobin was noted to be 7, with FOBT positive.  GI consulted.  Patient admitted for further management.  Today, pt noted to have several episodes of BRBPR, with drop in hemoglobin. Pt denies any worsening abdominal pain, fever/chills, N/V, chest pain, SOB.  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 Localized mild inflammation characterized by erosions and erythema was found in the duodenal bulb Colonoscopy revealed an ulcerated, likely malignant non-obstructing medium-sized mass in the cecum and a 6 mm polyp in the rectum that was sessile along with sigmoid colon diverticula CT abd/pelvis/chest showed: No evidence of metastatic disease test/abdomen/pelvis.  Possible hepatic cirrhosis Pathology confirms adenocarcinoma Gen surg on board: S/P laparoscopic R colectomy and umbilical repair on 09/02/42 C/w iron supplementation Continue PPI Monitor closely  Lower GI bleed with acute drop in  hemoglobin/Post op bleed Hgb drop to 6.6, with several episodes of BRBPR on 09/09/17 Gen surg on board: Likely anastomotic bleed, if persistent may need to return to OR for resection of anastomosis Spoke to GI, no further recs from their end, due to recent surgery (anastomotic bleed most likely etiology) S/P 2U of PRBC + 2U of FFP + Kcentra on 09/09/17 IVF Held ppx lovenox for now Will not resume eliquis/NOACs due to significant bleeding. Will defer to cardiology follow up as an outpt to decide resumption Trend CBC  Acute metabolic encephalopathy Resolved  Unknown etiology, currently back to baseline No narcotics given ABG unremarkable CT head no acute intracranial abnormalities Continue IV AB, supplemental O2, cpap Monitor in SDU  AKI on CKD Stage 3 Resolved Baseline Cr around 1.4 Gentle hydration with IVF Daily BMP  HCAP Noted productive cough Afebrile, no leukocytosis Urine strep pneumo negative, legionella pending BC X 2 NGTD CXR showing airspace consolidation in LLL with L pleural effusion Continue Aztreonam and IV Vanc  Right Pleural Effusion s/p thoracentesis CT Scan showed Moderate Fluid Thoracentesis done, obtained 650 mL of gold fluid  C/w Diuresis, increased lasix to 80 mg po daily  Acute on Chronic Diastolic HF Improved ECHO with EF of 55% to 60%. The study isnot technically sufficient to allow evaluation of LV diastolic function. CXR initially showed Cardiomegaly and mild pulmonary edema, repeat showed pulmonary edema resolved Continue PO lasix 80 mg daily, monitor BMP closely Strict I's/O's, Daily Weights  Chronic respiratory failure/COPD on Home O2 C/w 2 liters of O2 via [place]  Continue Xopenex/Atrovent, mucinex Flutter valve, incentive spirometry  Persistent Atrial Fibrillation  S/P Multiple DCCV Rate controlled CHADS2VASc: 4 but not on AC due  chronic GIB but now cardiology recommending starting the patient on Eliquis 5 mg twice daily once felt that he  is stable from a surgical standpoint Will not resume eliquis/NOACs due to significant bleeding. 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 HbA1c was 6.0 Continue SSI, accuchecks Continue gabapentin Hold Metformin 1000 mg po BID and Glimepiride 1 mg po Daily  HTN C/w Diltiazem 120 mg po Daily Hold Losartan 25 mg po Daily  OSA Ordered CPAP  HLD/Dyslipidemia  C/w statin  Gout Hold home Allopurinol 100 mg po Daily   BPPV Hold home Meclizine 25 mg po TIDprn  Bipolar Affective Disorder C/w Citalopram 20 mg po Daily and Bupropion 150 mg po Daily  C/w Temazepam 15 mg po qHS        Code Status: Full  Family Communication: None at bedside  Disposition Plan: TBD once more stable   Consultants:  Gen surg  Cardiology  GI  Procedures:  R colectomy and umbilical repair   Antimicrobials:  IV Aztreonam  IV Vanc  DVT prophylaxis:  Held Lovenox due to GIB   Objective: Vitals:   09/09/17 1415 09/09/17 1430 09/09/17 1445 09/09/17 1500  BP: 135/67 133/69 (!) 150/118 138/64  Pulse: 91 92 98 88  Resp: 19 (!) 23 (!) 24 (!) 21  Temp: 98.3 F (36.8 C) 98.2 F (36.8 C) 98.2 F (36.8 C)   TempSrc:      SpO2: 99% 90% 100% 99%  Weight:      Height:        Intake/Output Summary (Last 24 hours) at 09/09/2017 1620 Last data filed at 09/09/2017 1500 Gross per 24 hour  Intake 3273.08 ml  Output 2300 ml  Net 973.08 ml   Filed Weights   09/05/17 1324 09/08/17 0500 09/09/17 0400  Weight: 104.7 kg 107.7 kg 105.3 kg    Exam:   General:  NAD, chronically ill-appearing  Cardiovascular: S1, S2 present  Respiratory: Diminished breath sounds bilaterally  Abdomen: Soft, distended,  tympanic, bowel sounds present  Musculoskeletal: No pedal edema bilaterally  Skin: Normal  Psychiatry: Normal mood   Data Reviewed: CBC: Recent Labs  Lab 09/05/17 0504  09/06/17 0318 09/07/17 0328 09/08/17 0540 09/09/17 0348 09/09/17 0756 09/09/17 1516  WBC 3.6*   < > 9.2 6.9 5.6 4.9  --  5.2  NEUTROABS 2.6  --  8.2*  --  4.5 3.9  --  3.8  HGB 8.7*   < > 8.8* 8.2* 8.1* 7.3* 6.6* 8.5*  HCT 27.7*   < > 28.7* 26.7* 26.1* 23.7* 20.8* 25.7*  MCV 93.6   < > 95.0 95.4 92.9 92.6  --  90.5  PLT 173   < > 202 162 195 201  --  211   < > = values in this interval not displayed.   Basic Metabolic Panel: Recent Labs  Lab 09/03/17 0102 09/04/17 0511 09/05/17 0504  09/06/17 0318 09/07/17 0328 09/07/17 0620 09/08/17 0540 09/09/17 0348 09/09/17 1516  NA 138 141 141   < > 139 139 139 139 142 141  K 4.2 3.5 4.1   < > 5.0 4.1 4.0 3.6 3.3* 3.5  CL 103 103 103   < > 103 104 102 104 106 107  CO2 26 29 30    < > 26 27 29 26 28 28   GLUCOSE 131* 121* 125*   < > 156* 186* 164* 137* 134* 151*  BUN 19 15 15    < > 18 31* 31* 26* 22 19  CREATININE 1.15 1.18 1.25*   < > 1.61* 2.18* 1.96* 1.48* 1.24 1.16  CALCIUM 9.1 9.0 9.2   < > 8.9 8.9 9.0 9.0 8.5* 8.0*  MG 2.2 2.2 2.1  --  2.0  --   --   --   --   --   PHOS 4.0 4.5 4.0  --  4.5  --   --   --   --   --    < > = values in this interval not displayed.   GFR: Estimated Creatinine Clearance: 61.6 mL/min (by C-G formula based on SCr of 1.16 mg/dL). Liver Function Tests: Recent Labs  Lab 09/03/17 0102 09/04/17 0511 09/05/17 0504 09/06/17 0318  AST 16 15 22 15   ALT 10 10 10 11   ALKPHOS 55 53 57 53  BILITOT 0.6 0.5 0.6 0.7  PROT 6.9 6.5 6.7 6.7  ALBUMIN 3.7 3.5 3.6 3.4*   No results for input(s): LIPASE, AMYLASE in the last 168 hours. No results for input(s): AMMONIA in the last 168 hours. Coagulation Profile: No results for input(s): INR, PROTIME in the last 168 hours. Cardiac Enzymes: No results for input(s): CKTOTAL, CKMB,  CKMBINDEX, TROPONINI in the last 168 hours. BNP (last 3 results) No results for input(s): PROBNP in the last 8760 hours. HbA1C: No results for input(s): HGBA1C in the last 72 hours. CBG: Recent Labs  Lab 09/08/17 1146 09/08/17 1527 09/08/17 2127 09/09/17 0743 09/09/17 1219  GLUCAP 135* 174* 122* 115* 133*   Lipid Profile: No results for input(s): CHOL, HDL, LDLCALC, TRIG, CHOLHDL, LDLDIRECT in the last 72 hours. Thyroid Function Tests: No results for input(s): TSH, T4TOTAL, FREET4, T3FREE, THYROIDAB in the last 72 hours. Anemia Panel: No results for input(s): VITAMINB12, FOLATE, FERRITIN, TIBC, IRON, RETICCTPCT in the last 72 hours. Urine analysis:    Component Value Date/Time   COLORURINE YELLOW 01/27/2016 1730   APPEARANCEUR CLOUDY (A) 01/27/2016 1730   LABSPEC 1.019 01/27/2016 1730   PHURINE 5.0 01/27/2016 1730   GLUCOSEU NEGATIVE 01/27/2016 1730   HGBUR NEGATIVE 01/27/2016 1730   BILIRUBINUR NEGATIVE 01/27/2016 1730   KETONESUR 5 (A) 01/27/2016 1730   PROTEINUR NEGATIVE 01/27/2016 1730   UROBILINOGEN 0.2 10/22/2014 2136   NITRITE NEGATIVE 01/27/2016 1730   LEUKOCYTESUR NEGATIVE 01/27/2016 1730   Sepsis Labs: @LABRCNTIP (procalcitonin:4,lacticidven:4)  ) Recent Results (from the past 240 hour(s))  Body fluid culture     Status: Abnormal   Collection Time: 09/03/17  3:54 PM  Result Value Ref Range Status   Specimen Description   Final    PLEURAL RIGHT Performed at [facility], [place] [address] [place], [place] [address]    Special Requests   Final    NONE Performed at [facility], [place] [address], [place], [place] [address]    Gram Stain   Final    WBC PRESENT,BOTH PMN AND MONONUCLEAR NO ORGANISMS SEEN CYTOSPIN SMEAR    Culture (A)  Final    FUNGUS (MOLD) ISOLATED, PROBABLE CONTAMINANT/COLONIZER (SAPROPHYTE). CONTACT MICROBIOLOGY IF FURTHER IDENTIFICATION REQUIRED [phone]. CRITICAL RESULT CALLED TO, READ BACK BY  AND VERIFIED WITH: Cannon Kettle RN, AT [phone] 09/06/17 BY D. VANHOOK REGARDING CULTURE GROWTH Performed at [facility], [place] [address]  [place], [place] [address]    Report Status 09/07/2017 FINAL  Final  Surgical pcr screen     Status: None   Collection Time: 09/05/17  8:26 AM  Result Value Ref Range Status   MRSA, PCR NEGATIVE NEGATIVE Final   Staphylococcus aureus NEGATIVE NEGATIVE Final    Comment: (NOTE) The Xpert SA Assay (FDA approved for NASAL specimens in patients 64 years of age and older), is one component of a comprehensive surveillance program. It is not intended to diagnose infection nor to guide or monitor treatment. Performed at [facility], [place] [address] [place], [place] [address]   Culture, blood (routine x 2)     Status: None (Preliminary result)   Collection Time: 09/07/17  4:34 PM  Result Value Ref Range Status   Specimen Description   Final    BLOOD LEFT ANTECUBITAL Performed at [facility] [address] [place], [place] [address]    Special Requests   Final    BOTTLES DRAWN AEROBIC AND ANAEROBIC Blood Culture adequate volume Performed at [facility] [address] [place], [place] [address]    Culture   Final    NO GROWTH 2 DAYS Performed at [facility] [address] [place], [place] [address]    Report Status PENDING  Incomplete  Culture, blood (routine x 2)     Status: None (Preliminary result)   Collection Time: 09/07/17  4:40 PM  Result Value Ref Range Status   Specimen Description   Final    BLOOD BLOOD RIGHT HAND Performed at [facility] [address], [place], [place] [address]    Special Requests   Final    BOTTLES DRAWN AEROBIC AND ANAEROBIC Blood Culture adequate volume Performed at [facility] [address] [place], [place] [address]    Culture   Final    NO GROWTH 2 DAYS Performed at [facility]  [address], [place],  [address]    Report Status PENDING  Incomplete      Studies: No results found.  Scheduled Meds: . buPROPion  150 mg Oral Daily  . citalopram  20 mg Oral Daily  . diltiazem  120 mg Oral Daily  . feeding supplement  1 Container Oral TID WC  . feeding supplement  237 mL Oral BID BM  . furosemide  80 mg Oral Daily  . guaiFENesin  1,200 mg Oral BID  . insulin aspart  0-15 Units Subcutaneous TID WC  . insulin aspart  0-5 Units Subcutaneous QHS  . ipratropium  0.5 mg Nebulization BID  . iron polysaccharides  150 mg Oral BID  . levalbuterol  0.63 mg Nebulization BID  . mouth rinse  15 mL Mouth Rinse BID  . pantoprazole  40 mg Oral BID  . potassium chloride SA  20 mEq Oral Daily  . potassium chloride  40 mEq Oral BID  . pravastatin  20 mg Oral q1800    Continuous Infusions: . sodium chloride 75 mL/hr at 09/09/17 1500  . aztreonam Stopped (09/09/17 0839)  . vancomycin Stopped (09/09/17 1353)     LOS: 10 days     Alma Friendly, MD [facility]   If 7PM-7AM, please contact night-coverage [email or web address] Password [email or web address] 09/09/2017, 4:20 PM

## 2017-09-09 NOTE — Progress Notes (Signed)
Pharmacy Antibiotic Note  Joel [name] is a 84 y.o. male admitted on 08/30/2017 after PCP visit for low hemoglobin. Hemoglobin confirmed 7 and hemoccult was positive for blood. Patient had colonoscopy significant for cecal mass. Patient underwent surgical resection of adenocarcinoma laproscopically and primary repair of umbilical hernia on 8/5. Chest x-ray on 8/7 significant for new airspace consolidation left lower lobe with left pleural effusion. Patient started on aztreonam on 8/7 for suspected HCAP due to documented anaphylaxis to penicillin. Patient transferred to ICU on 8/8 secondary to AMS. Initiate vancomycin on 8/8 to provide Gram + coverage.   -afebrile, WBC WNL -Scr trending down 1.48> 1.24 -Blood cultures on 8/7 no growth thus far  Plan: ? De-escalate to oral levaquin -Continue aztreonam 2 g IV q8h vancomycin 2000 mg IV x 1 given 8/8 at 1230, then 1250 mg q24h (estimated AUC 469) -Monitor: renal function, clinical status -F/u with culture results  -vancomycin levels as needed  Height: 6' (182.9 cm) Weight: 232 lb 2.3 oz (105.3 kg) IBW/kg (Calculated) : 77.6  Temp (24hrs), Avg:98.1 F (36.7 C), Min:97.4 F (36.3 C), Max:99.8 F (37.7 C)  Recent Labs  Lab 09/05/17 1333 09/06/17 0318 09/07/17 0328 09/07/17 0620 09/08/17 0540 09/09/17 0348  WBC 6.2 9.2 6.9  --  5.6 4.9  CREATININE 1.32* 1.61* 2.18* 1.96* 1.48* 1.24    Estimated Creatinine Clearance: 57.6 mL/min (by C-G formula based on SCr of 1.24 mg/dL).    Allergies  Allergen Reactions  . Penicillins Anaphylaxis    Has patient had a PCN reaction causing immediate rash, facial/tongue/throat swelling, SOB or lightheadedness with hypotension: unknown Has patient had a PCN reaction causing severe rash involving mucus membranes or skin necrosis: unknown Has patient had a PCN reaction that required hospitalization: unknown Has patient had a PCN reaction occurring within the last 10 years: no If all of the above  answers are "NO", then may proceed with Cephalosporin use.   Marland Kitchen Antihistamines, Loratadine-Type Other (See Comments)    depression  . Other     Beta blockers-Novacaine  Caused depression    Antimicrobials this admission: 8/7 aztreonam>> 8/8 vancomycin>> Dose adjustments this admission: 8/8 change vanc 1 gm q24 to vanc 1250 q24 for improved renal function Microbiology results: 8/7 BCx x2: NGTD 8/7 MRSA PCR: negative 8/5 Body FluidCx: fungus (likely contaminant)   Thank you for allowing pharmacy to be a part of this patient's care. Eudelia Bunch, Pharm.D [phone] 09/09/2017 9:07 AM

## 2017-09-09 NOTE — Progress Notes (Signed)
PT Cancellation Note  Patient Details Name: JOZEPH PERSING MRN: [id number] DOB: November 04, 1932   Cancelled Treatment:     receiving Rapid Blood Transfusion today.  Will attempt to see another day as schedule permits.  Pt requires + 2 assist.     Rica Koyanagi  PTA [facility]  Acute  Rehab Pager      [phone]

## 2017-09-09 NOTE — Progress Notes (Signed)
Checked on patient again.  He says he still feels like "shit" but is sitting up reading the paper in NAD.  Vitals are stable.  He has had 3 more large bloody BMs since we last checked on him around 1400.  RN states that his abdominal incision has bled as well.  Dressing is currently dry.  hgb has come up from 6.6 to 8.5 after 3 units of pRBCs.  He has received his Kcentra as well.  Given his persistently bleeding, we will give him 2 more units of pRBCs as well as now 2 units of FFP.  We will start resuscitating with blood products from here on out at 1:1 ratio.  plts are still 211, but if continues to need more blood products, he may at some point needs plts given as well.  Continue to monitor closely for further plans.  Henreitta Cea 4:42 PM 09/09/2017

## 2017-09-09 NOTE — Discharge Instructions (Addendum)

## 2017-09-10 ENCOUNTER — Inpatient Hospital Stay (HOSPITAL_COMMUNITY): Payer: PPO | Admitting: Registered Nurse

## 2017-09-10 ENCOUNTER — Encounter (HOSPITAL_COMMUNITY)
Admission: EM | Disposition: A | Discharge status: Home Health | Payer: Self-pay | Source: Home / Self Care | Attending: Internal Medicine

## 2017-09-10 HISTORY — PX: LAPAROTOMY: SHX154

## 2017-09-10 LAB — PROTIME-INR
INR: 1.41
Prothrombin Time: 17.2 seconds — ABNORMAL HIGH (ref 11.4–15.2)

## 2017-09-10 LAB — CBC WITH DIFFERENTIAL/PLATELET
BASOS ABS: 0 10*3/uL (ref 0.0–0.1)
BASOS PCT: 0 %
EOS ABS: 0.2 10*3/uL (ref 0.0–0.7)
Eosinophils Relative: 4 %
HCT: 21.3 % — ABNORMAL LOW (ref 39.0–52.0)
HEMOGLOBIN: 7.2 g/dL — AB (ref 13.0–17.0)
LYMPHS ABS: 0.6 10*3/uL — AB (ref 0.7–4.0)
Lymphocytes Relative: 11 %
MCH: 30.3 pg (ref 26.0–34.0)
MCHC: 33.8 g/dL (ref 30.0–36.0)
MCV: 89.5 fL (ref 78.0–100.0)
Monocytes Absolute: 0.5 10*3/uL (ref 0.1–1.0)
Monocytes Relative: 9 %
NEUTROS PCT: 76 %
Neutro Abs: 4.2 10*3/uL (ref 1.7–7.7)
Platelets: 187 10*3/uL (ref 150–400)
RBC: 2.38 MIL/uL — AB (ref 4.22–5.81)
RDW: 15.2 % (ref 11.5–15.5)
WBC: 5.6 10*3/uL (ref 4.0–10.5)

## 2017-09-10 LAB — BASIC METABOLIC PANEL
ANION GAP: 7 (ref 5–15)
BUN: 16 mg/dL (ref 8–23)
CHLORIDE: 107 mmol/L (ref 98–111)
CO2: 26 mmol/L (ref 22–32)
Calcium: 7.5 mg/dL — ABNORMAL LOW (ref 8.9–10.3)
Creatinine, Ser: 0.99 mg/dL (ref 0.61–1.24)
GFR calc non Af Amer: >60 mL/min (ref >60)
Glucose, Bld: 139 mg/dL — ABNORMAL HIGH (ref 70–99)
POTASSIUM: 3.2 mmol/L — AB (ref 3.5–5.1)
SODIUM: 140 mmol/L (ref 135–145)

## 2017-09-10 LAB — GLUCOSE, CAPILLARY
GLUCOSE-CAPILLARY: 124 mg/dL — AB (ref 70–99)
GLUCOSE-CAPILLARY: 156 mg/dL — AB (ref 70–99)
GLUCOSE-CAPILLARY: 130 mg/dL — AB (ref 70–99)
GLUCOSE-CAPILLARY: 132 mg/dL — AB (ref 70–99)
Glucose-Capillary: 150 mg/dL — ABNORMAL HIGH (ref 70–99)
Glucose-Capillary: 137 mg/dL — ABNORMAL HIGH (ref 70–99)
Glucose-Capillary: 119 mg/dL — ABNORMAL HIGH (ref 70–99)
Glucose-Capillary: 119 mg/dL — ABNORMAL HIGH (ref 70–99)

## 2017-09-10 LAB — PREPARE RBC (CROSSMATCH)

## 2017-09-10 LAB — HEMOGLOBIN AND HEMATOCRIT, BLOOD
HEMATOCRIT: 30 % — AB (ref 39.0–52.0)
HEMATOCRIT: 30.2 % — AB (ref 39.0–52.0)
HEMOGLOBIN: 10.1 g/dL — AB (ref 13.0–17.0)
Hemoglobin: 10.2 g/dL — ABNORMAL LOW (ref 13.0–17.0)

## 2017-09-10 LAB — POCT I-STAT 4, (NA,K, GLUC, HGB,HCT)
Glucose, Bld: 132 mg/dL — ABNORMAL HIGH (ref 70–99)
HCT: 24 % — ABNORMAL LOW (ref 39.0–52.0)
Hemoglobin: 8.2 g/dL — ABNORMAL LOW (ref 13.0–17.0)
Potassium: 3.6 mmol/L (ref 3.5–5.1)
Sodium: 143 mmol/L (ref 135–145)

## 2017-09-10 LAB — LEGIONELLA PNEUMOPHILA SEROGP 1 UR AG: L. pneumophila Serogp 1 Ur Ag: NEGATIVE

## 2017-09-10 LAB — APTT: aPTT: 41 seconds — ABNORMAL HIGH (ref 24–36)

## 2017-09-10 SURGERY — LAPAROTOMY, EXPLORATORY
Anesthesia: General | Site: Abdomen

## 2017-09-10 MED ORDER — LIDOCAINE 2% (20 MG/ML) 5 ML SYRINGE
INTRAMUSCULAR | Status: AC
  Filled 2017-09-10: qty 5

## 2017-09-10 MED ORDER — SODIUM CHLORIDE 0.9 % IV SOLN
INTRAVENOUS | Status: DC | PRN
  Administered 2017-09-10: 09:00:00 via INTRAVENOUS

## 2017-09-10 MED ORDER — PHENYLEPHRINE HCL 10 MG/ML IJ SOLN
INTRAMUSCULAR | Status: DC | PRN
  Administered 2017-09-10: 240 ug via INTRAVENOUS

## 2017-09-10 MED ORDER — FENTANYL CITRATE (PF) 100 MCG/2ML IJ SOLN
25.0000 ug | INTRAMUSCULAR | Status: DC | PRN
  Administered 2017-09-10: 50 ug via INTRAVENOUS

## 2017-09-10 MED ORDER — METHOCARBAMOL 1000 MG/10ML IJ SOLN
500.0000 mg | Freq: Four times a day (QID) | INTRAMUSCULAR | Status: DC | PRN
  Filled 2017-09-10: qty 5

## 2017-09-10 MED ORDER — POTASSIUM CHLORIDE 10 MEQ/100ML IV SOLN
10.0000 meq | INTRAVENOUS | Status: AC
  Administered 2017-09-10 (×2): 10 meq via INTRAVENOUS
  Filled 2017-09-10 (×2): qty 100

## 2017-09-10 MED ORDER — SUCCINYLCHOLINE CHLORIDE 200 MG/10ML IV SOSY
PREFILLED_SYRINGE | INTRAVENOUS | Status: DC | PRN
  Administered 2017-09-10: 120 mg via INTRAVENOUS

## 2017-09-10 MED ORDER — FENTANYL CITRATE (PF) 250 MCG/5ML IJ SOLN
INTRAMUSCULAR | Status: AC
  Filled 2017-09-10: qty 5

## 2017-09-10 MED ORDER — ROCURONIUM BROMIDE 10 MG/ML (PF) SYRINGE
PREFILLED_SYRINGE | INTRAVENOUS | Status: AC
  Filled 2017-09-10: qty 10

## 2017-09-10 MED ORDER — ONDANSETRON HCL 4 MG/2ML IJ SOLN
INTRAMUSCULAR | Status: AC
  Filled 2017-09-10: qty 2

## 2017-09-10 MED ORDER — PROMETHAZINE HCL 25 MG/ML IJ SOLN
6.2500 mg | Freq: Once | INTRAMUSCULAR | Status: AC | PRN
  Administered 2017-09-10: 6.25 mg via INTRAVENOUS

## 2017-09-10 MED ORDER — PROMETHAZINE HCL 25 MG/ML IJ SOLN
INTRAMUSCULAR | Status: AC
  Filled 2017-09-10: qty 1

## 2017-09-10 MED ORDER — LIDOCAINE 2% (20 MG/ML) 5 ML SYRINGE
INTRAMUSCULAR | Status: DC | PRN
  Administered 2017-09-10: 100 mg via INTRAVENOUS

## 2017-09-10 MED ORDER — ETOMIDATE 2 MG/ML IV SOLN
INTRAVENOUS | Status: DC | PRN
  Administered 2017-09-10: 12 mg via INTRAVENOUS

## 2017-09-10 MED ORDER — ACETAMINOPHEN 325 MG PO TABS
650.0000 mg | ORAL_TABLET | Freq: Four times a day (QID) | ORAL | Status: DC
  Administered 2017-09-10 – 2017-09-16 (×19): 650 mg via ORAL
  Filled 2017-09-10 (×20): qty 2

## 2017-09-10 MED ORDER — METOCLOPRAMIDE HCL 5 MG/ML IJ SOLN
INTRAMUSCULAR | Status: AC
  Filled 2017-09-10: qty 2

## 2017-09-10 MED ORDER — ROCURONIUM BROMIDE 10 MG/ML (PF) SYRINGE
PREFILLED_SYRINGE | INTRAVENOUS | Status: DC | PRN
  Administered 2017-09-10: 50 mg via INTRAVENOUS

## 2017-09-10 MED ORDER — LACTATED RINGERS IV SOLN
INTRAVENOUS | Status: DC | PRN
  Administered 2017-09-10: 10:00:00 via INTRAVENOUS

## 2017-09-10 MED ORDER — SUGAMMADEX SODIUM 200 MG/2ML IV SOLN
INTRAVENOUS | Status: DC | PRN
  Administered 2017-09-10: 200 mg via INTRAVENOUS

## 2017-09-10 MED ORDER — METRONIDAZOLE IN NACL 5-0.79 MG/ML-% IV SOLN: 500.0000 mg | Freq: Once | INTRAVENOUS | Status: DC

## 2017-09-10 MED ORDER — ONDANSETRON HCL 4 MG/2ML IJ SOLN
INTRAMUSCULAR | Status: DC | PRN
  Administered 2017-09-10: 4 mg via INTRAVENOUS

## 2017-09-10 MED ORDER — SODIUM CHLORIDE 0.9% IV SOLUTION
Freq: Once | INTRAVENOUS | Status: AC
  Administered 2017-09-10: 06:00:00 via INTRAVENOUS

## 2017-09-10 MED ORDER — OXYCODONE HCL 5 MG PO TABS
5.0000 mg | ORAL_TABLET | ORAL | Status: DC | PRN
  Administered 2017-09-10: 5 mg via ORAL
  Filled 2017-09-10: qty 1

## 2017-09-10 MED ORDER — 0.9 % SODIUM CHLORIDE (POUR BTL) OPTIME
TOPICAL | Status: DC | PRN
  Administered 2017-09-10: 1000 mL

## 2017-09-10 MED ORDER — PROPOFOL 10 MG/ML IV BOLUS
INTRAVENOUS | Status: AC
  Filled 2017-09-10: qty 20

## 2017-09-10 MED ORDER — FENTANYL CITRATE (PF) 100 MCG/2ML IJ SOLN
INTRAMUSCULAR | Status: AC
  Filled 2017-09-10: qty 2

## 2017-09-10 MED ORDER — METOCLOPRAMIDE HCL 5 MG/ML IJ SOLN
5.0000 mg | Freq: Once | INTRAMUSCULAR | Status: AC | PRN
  Administered 2017-09-10: 5 mg via INTRAVENOUS

## 2017-09-10 MED ORDER — CIPROFLOXACIN IN D5W 400 MG/200ML IV SOLN: 400.0000 mg | Freq: Once | INTRAVENOUS | Status: DC

## 2017-09-10 MED ORDER — SUCCINYLCHOLINE CHLORIDE 200 MG/10ML IV SOSY
PREFILLED_SYRINGE | INTRAVENOUS | Status: AC
  Filled 2017-09-10: qty 10

## 2017-09-10 MED ORDER — FENTANYL CITRATE (PF) 100 MCG/2ML IJ SOLN
INTRAMUSCULAR | Status: DC | PRN
  Administered 2017-09-10: 100 ug via INTRAVENOUS
  Administered 2017-09-10: 50 ug via INTRAVENOUS
  Administered 2017-09-10 (×2): 100 ug via INTRAVENOUS

## 2017-09-10 MED ORDER — HYDROMORPHONE HCL 1 MG/ML IJ SOLN: 0.5000 mg | INTRAMUSCULAR | Status: DC | PRN

## 2017-09-10 MED ORDER — SODIUM CHLORIDE 0.9 % IV SOLN
10.0000 mL/h | Freq: Once | INTRAVENOUS | Status: DC
Start: 2017-09-10 — End: 2017-09-16

## 2017-09-10 MED ORDER — SUGAMMADEX SODIUM 200 MG/2ML IV SOLN
INTRAVENOUS | Status: AC
  Filled 2017-09-10: qty 2

## 2017-09-10 MED ORDER — VANCOMYCIN HCL 10 G IV SOLR
1500.0000 mg | INTRAVENOUS | Status: DC
  Filled 2017-09-10 (×2): qty 1500

## 2017-09-10 MED ORDER — GABAPENTIN 100 MG PO CAPS
200.0000 mg | ORAL_CAPSULE | Freq: Two times a day (BID) | ORAL | Status: DC
  Administered 2017-09-10 – 2017-09-16 (×12): 200 mg via ORAL
  Filled 2017-09-10 (×12): qty 2

## 2017-09-10 SURGICAL SUPPLY — 41 items
APL SWBSTK 6 STRL LF DISP (MISCELLANEOUS) ×1
APPLICATOR COTTON TIP 6 STRL (MISCELLANEOUS) ×1 IMPLANT
APPLICATOR COTTON TIP 6IN STRL (MISCELLANEOUS) ×3
BLADE EXTENDED COATED 6.5IN (ELECTRODE) ×3 IMPLANT
BLADE HEX COATED 2.75 (ELECTRODE) ×3 IMPLANT
COVER MAYO STAND STRL (DRAPES) IMPLANT
COVER SURGICAL LIGHT HANDLE (MISCELLANEOUS) ×3 IMPLANT
DRAPE LAPAROSCOPIC ABDOMINAL (DRAPES) ×3 IMPLANT
DRAPE WARM FLUID 44X44 (DRAPE) ×2 IMPLANT
ELECT REM PT RETURN 15FT ADLT (MISCELLANEOUS) ×3 IMPLANT
GAUZE SPONGE 4X4 12PLY STRL (GAUZE/BANDAGES/DRESSINGS) ×3 IMPLANT
GLOVE BIOGEL PI IND STRL 7.0 (GLOVE) ×1 IMPLANT
GLOVE BIOGEL PI INDICATOR 7.0 (GLOVE) ×2
GOWN STRL REUS W/TWL LRG LVL3 (GOWN DISPOSABLE) ×3 IMPLANT
GOWN STRL REUS W/TWL XL LVL3 (GOWN DISPOSABLE) ×6 IMPLANT
HANDLE SUCTION POOLE (INSTRUMENTS) IMPLANT
KIT BASIN OR (CUSTOM PROCEDURE TRAY) ×3 IMPLANT
LIGASURE IMPACT 36 18CM CVD LR (INSTRUMENTS) ×3 IMPLANT
NS IRRIG 1000ML POUR BTL (IV SOLUTION) ×3 IMPLANT
PACK GENERAL/GYN (CUSTOM PROCEDURE TRAY) ×3 IMPLANT
PAD ABD 7.5X8 STRL (GAUZE/BANDAGES/DRESSINGS) ×3 IMPLANT
PAD ABD 8X10 STRL (GAUZE/BANDAGES/DRESSINGS) ×2 IMPLANT
RELOAD PROXIMATE 75MM BLUE (ENDOMECHANICALS) ×6 IMPLANT
RELOAD STAPLE 75 3.8 BLU REG (ENDOMECHANICALS) IMPLANT
REMOVER STAPLE SKIN (DISPOSABLE) ×2 IMPLANT
SPONGE LAP 18X18 RF (DISPOSABLE) ×2 IMPLANT
STAPLER PROXIMATE 75MM BLUE (STAPLE) ×3 IMPLANT
STAPLER VISISTAT 35W (STAPLE) ×3 IMPLANT
SUCTION POOLE HANDLE (INSTRUMENTS)
SUT PDS AB 1 CTX 36 (SUTURE) IMPLANT
SUT PDS AB 1 TP1 96 (SUTURE) ×6 IMPLANT
SUT SILK 2 0 (SUTURE)
SUT SILK 2 0 SH CR/8 (SUTURE) ×2 IMPLANT
SUT SILK 2-0 18XBRD TIE 12 (SUTURE) IMPLANT
SUT SILK 3 0 (SUTURE) ×3
SUT SILK 3 0 SH CR/8 (SUTURE) ×4 IMPLANT
SUT SILK 3-0 18XBRD TIE 12 (SUTURE) IMPLANT
TAPE CLOTH SURG 4X10 WHT LF (GAUZE/BANDAGES/DRESSINGS) ×2 IMPLANT
TOWEL OR 17X26 10 PK STRL BLUE (TOWEL DISPOSABLE) ×6 IMPLANT
TRAY FOLEY MTR SLVR 16FR STAT (SET/KITS/TRAYS/PACK) ×3 IMPLANT
YANKAUER SUCT BULB TIP NO VENT (SUCTIONS) IMPLANT

## 2017-09-10 NOTE — Progress Notes (Signed)
PROGRESS NOTE  Joel [name]:[id number] DOB: 23-Oct-1932 DOA: 08/30/2017 PCP: Wenda Low, MD  HPI/Recap of past 59 hours: 84 year old male with medical history significant for CAD, A. fib not on AC due to recurrent GI bleed from possible gastric AVMs, diabetes, hypertension, COPD, OSA, CKD, diastolic CHF presents to the ED complaining of generalized weakness, with abdominal bloating.  In the ED hemoglobin was noted to be 7, with FOBT positive.  GI consulted.  Patient admitted for further management.  Overnight, had several episodes of bloody BM with drop in hgb. Today, after surgery, had another large episode of large BRBPR with clots noted. Met pt sleeping post op, easily arousable, just had pain meds. Denies any new complaints.   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 Localized mild inflammation characterized by erosions and erythema was found in the duodenal bulb Colonoscopy revealed an ulcerated, likely malignant non-obstructing medium-sized mass in the cecum and a 6 mm polyp in the rectum that was sessile along with sigmoid colon diverticula CT abd/pelvis/chest showed: No evidence of metastatic disease on chest/abdomen/pelvis. Possible hepatic cirrhosis Pathology confirms adenocarcinoma Gen surg on board: S/P laparoscopic R colectomy and umbilical repair on 06/06/71 C/w iron supplementation Continue PPI Monitor closely  Lower GI  bleed with acute drop in hemoglobin/Post op bleed s/p ex lap with partial colectomy on 09/10/17 Hgb drop to 6.6, with several episodes of BRBPR starting 09/09/17 Gen surg on board: S/P ex-lap with partial colectomy on 09/10/17 S/P 6U of PRBC + 2U of FFP + Kcentra since this admission IVF Held ppx lovenox for now Will not resume eliquis/NOACs due to significant bleeding. Will defer to cardiology follow up as an outpt to decide resumption Trend CBC  Acute metabolic encephalopathy Resolved  Unknown etiology, currently back to baseline No narcotics given ABG unremarkable CT head no acute intracranial abnormalities Continue IV AB, supplemental O2, cpap Monitor in SDU  AKI on CKD Stage 3 Resolved Baseline Cr around 1.4 Gentle hydration with IVF Daily BMP  HCAP Noted productive cough Afebrile, no leukocytosis Urine strep pneumo and legionella negative BC X 2 NGTD CXR showing airspace consolidation in LLL with L pleural effusion Continue Aztreonam and IV Vanc for 5 days, plan to de-escalate once HD stable  Right Pleural Effusion s/p thoracentesis CT Scan showed Moderate Fluid Thoracentesis done, obtained 650 mL of gold fluid  C/w Diuresis, increased lasix to 80 mg po daily  Acute on Chronic Diastolic HF Improved ECHO with EF of 55% to 60%. The study isnot technically sufficient to allow evaluation of LV diastolic function. CXR initially showed Cardiomegaly and mild pulmonary edema, repeat showed pulmonary edema resolved Continue PO lasix 80 mg daily, monitor BMP closely Strict I's/O's, Daily Weights  Chronic respiratory failure/COPD on Home O2 C/w 2 liters of O2 via [place]  Continue Xopenex/Atrovent, mucinex Flutter valve, incentive spirometry  Persistent Atrial Fibrillation  S/P Multiple DCCV Rate controlled CHADS2VASc: 4 but not on AC due chronic GIB  but now cardiology recommending starting the patient on Eliquis 5 mg twice daily once felt that he is stable from a surgical  standpoint Will not resume eliquis/NOACs due to significant bleeding. 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 HbA1c was 6.0 Continue SSI, accuchecks Continue gabapentin Hold Metformin 1000 mg po BID and Glimepiride 1 mg po Daily  HTN C/w Diltiazem 120 mg po Daily Hold Losartan 25 mg po Daily  OSA Ordered CPAP  HLD/Dyslipidemia  C/w statin  Gout Hold home Allopurinol 100 mg po Daily   BPPV Hold home Meclizine 25 mg po TIDprn  Bipolar Affective Disorder C/w Citalopram 20 mg po Daily and Bupropion 150 mg po Daily  C/w Temazepam 15 mg po qHS        Code Status: Full  Family Communication: None at bedside  Disposition Plan: TBD once more stable   Consultants:  Gen surg  Cardiology  GI  Procedures:  R colectomy and umbilical repair   Ex lap, with partial colectomy  Antimicrobials:  IV Aztreonam  IV Vanc  DVT prophylaxis:  Held Lovenox due to GIB   Objective: Vitals:   09/10/17 1330 09/10/17 1400 09/10/17 1430 09/10/17 1500  BP: 133/61 (!) 127/58 (!) 126/59 (!) 113/51  Pulse: (!) 110 (!) 102 95 99  Resp: (!) 33 (!) 28 (!) 24 (!) 27  Temp:      TempSrc:      SpO2: 99% 99% 99% 99%  Weight:      Height:        Intake/Output Summary (Last 24 hours) at 09/10/2017 1608 Last data filed at 09/10/2017 1500 Gross per 24 hour  Intake 6414.71 ml  Output 2490 ml  Net 3924.71 ml   Filed Weights   09/08/17 0500 09/09/17 0400 09/10/17 0307  Weight: 107.7 kg 105.3 kg 105.3 kg    Exam:   General:  NAD, chronically ill-appearing  Cardiovascular: S1, S2 present  Respiratory: Diminished breath sounds bilaterally  Abdomen: Large dressing covering the entire  abdomen, C/D/I, distended, hypoactive BS  Musculoskeletal: No pedal edema bilaterally  Skin: Normal  Psychiatry: Normal mood   Data Reviewed: CBC: Recent Labs  Lab 09/08/17 0540 09/09/17 0348  09/09/17 1516  09/09/17 1948 09/09/17 2329 09/10/17 0357 09/10/17 1047 09/10/17 1246  WBC 5.6 4.9  --  5.2  --  4.6 5.3 5.6  --   --   NEUTROABS 4.5 3.9  --  3.8  --  3.3  --  4.2  --   --   HGB 8.1* 7.3*   < > 8.5*   < > 9.1* 8.3* 7.2* 8.2* 10.1*  HCT 26.1* 23.7*   < > 25.7*   < > 27.2* 24.9* 21.3* 24.0* 30.0*  MCV 92.9 92.6  --  90.5  --  90.1 89.9 89.5  --   --   PLT 195 201  --  211  --  181 166 187  --   --    < > = values in this interval not displayed.   Basic Metabolic Panel: Recent Labs  Lab 09/04/17 0511 09/05/17 0504  09/06/17 0318  09/07/17 0620 09/08/17 0540 09/09/17 0348 09/09/17 1516 09/09/17 1612 09/10/17 0357 09/10/17 1047  NA 141 141   < >  139   < > 139 139 142 141 142 140 143  K 3.5 4.1   < > 5.0   < > 4.0 3.6 3.3* 3.5 3.4* 3.2* 3.6  CL 103 103   < > 103   < > 102 104 106 107 101 107  --   CO2 29 30   < > 26   < > _0 --  26  --   GLUCOSE 121* 125*   < > 156*   < > 164* 137* 134* 151* 139* 139* 132*  BUN 15 15   < > 18   < > 31* 26* _1 --   CREATININE 1.18 1.25*   < > 1.61*   < > 1.96* 1.48* 1.24 1.16 1.10 0.99  --   CALCIUM 9.0 9.2   < > 8.9   < > 9.0 9.0 8.5* 8.0*  --  7.5*  --   MG 2.2 2.1  --  2.0  --   --   --   --   --   --   --   --   PHOS 4.5 4.0  --  4.5  --   --   --   --   --   --   --   --    < > = values in this interval not displayed.   GFR: Estimated Creatinine Clearance: 72.2 mL/min (by C-G formula based on SCr of 0.99 mg/dL). Liver Function Tests: Recent Labs  Lab 09/04/17 0511 09/05/17 0504 09/06/17 0318  AST _2 ALT _3 ALKPHOS 53 57 53  BILITOT 0.5 0.6 0.7  PROT 6.5 6.7 6.7  ALBUMIN 3.5 3.6 3.4*   No results for input(s): LIPASE, AMYLASE in the last 168 hours. No results for input(s):  AMMONIA in the last 168 hours. Coagulation Profile: Recent Labs  Lab 09/09/17 1609 09/10/17 0357  INR 1.27 1.41   Cardiac Enzymes: No results for input(s): CKTOTAL, CKMB, CKMBINDEX, TROPONINI in the last 168 hours. BNP (last 3 results) No results for input(s): PROBNP in the last 8760 hours. HbA1C: No results for input(s): HGBA1C in the last 72 hours. CBG: Recent Labs  Lab 09/09/17 2053 09/10/17 0107 09/10/17 0343 09/10/17 0743 09/10/17 1132  GLUCAP 136* 150* 137* 124* 156*   Lipid Profile: No results for input(s): CHOL, HDL, LDLCALC, TRIG, CHOLHDL, LDLDIRECT in the last 72 hours. Thyroid Function Tests: No results for input(s): TSH, T4TOTAL, FREET4, T3FREE, THYROIDAB in the last 72 hours. Anemia Panel: No results for input(s): VITAMINB12, FOLATE, FERRITIN, TIBC, IRON, RETICCTPCT in the last 72 hours. Urine analysis:    Component Value Date/Time   COLORURINE YELLOW 01/27/2016 1730   APPEARANCEUR CLOUDY (A) 01/27/2016 1730   LABSPEC 1.019 01/27/2016 1730   PHURINE 5.0 01/27/2016 1730   GLUCOSEU NEGATIVE 01/27/2016 1730   HGBUR NEGATIVE 01/27/2016 1730   BILIRUBINUR NEGATIVE 01/27/2016 1730   KETONESUR 5 (A) 01/27/2016 1730   PROTEINUR NEGATIVE 01/27/2016 1730   UROBILINOGEN 0.2 10/22/2014 2136   NITRITE NEGATIVE 01/27/2016 1730   LEUKOCYTESUR NEGATIVE 01/27/2016 1730   Sepsis Labs: _4 (procalcitonin:4,lacticidven:4)  ) Recent Results (from the past 240 hour(s))  Body fluid culture     Status: Abnormal   Collection Time: 09/03/17  3:54 PM  Result Value Ref Range Status   Specimen Description   Final    PLEURAL RIGHT Performed at [facility], [address] [place], [place]  [address]    Special Requests   Final    NONE Performed at [facility], [place] [address], [place], [place] [address]    Gram Stain   Final    WBC PRESENT,BOTH PMN AND MONONUCLEAR NO ORGANISMS SEEN CYTOSPIN SMEAR    Culture (A)  Final     FUNGUS (MOLD) ISOLATED, PROBABLE CONTAMINANT/COLONIZER (SAPROPHYTE). CONTACT MICROBIOLOGY IF FURTHER IDENTIFICATION REQUIRED [phone]. CRITICAL RESULT CALLED TO, READ BACK BY AND VERIFIED WITH: Cannon Kettle RN, AT [phone] 09/06/17 BY D. VANHOOK REGARDING CULTURE GROWTH Performed at [facility], [place] [address] [place], [place] [address]    Report Status 09/07/2017 FINAL  Final  Surgical pcr screen     Status: None   Collection Time: 09/05/17  8:26 AM  Result Value Ref Range Status   MRSA, PCR NEGATIVE NEGATIVE Final   Staphylococcus aureus NEGATIVE NEGATIVE Final    Comment: (NOTE) The Xpert SA Assay (FDA approved for NASAL specimens in patients 74 years of age and older), is one component of a comprehensive surveillance program. It is not intended to diagnose infection nor to guide or monitor treatment. Performed at [facility], [place] [name] [place], [place] [address]   Culture, blood (routine x 2)     Status: None (Preliminary result)   Collection Time: 09/07/17  4:34 PM  Result Value Ref Range Status   Specimen Description   Final    BLOOD LEFT ANTECUBITAL Performed at [facility] [address] [place], [place] [address]    Special Requests   Final    BOTTLES DRAWN AEROBIC AND ANAEROBIC Blood Culture adequate volume Performed at [place] [address], [place], [place] [address]    Culture   Final    NO GROWTH 3 DAYS Performed at [facility], [place] [address] [place], [place] [address]    Report Status PENDING  Incomplete  Culture, blood (routine x 2)     Status: None (Preliminary result)   Collection Time: 09/07/17  4:40 PM  Result Value Ref Range Status   Specimen Description   Final    BLOOD BLOOD RIGHT HAND Performed at [facility] [address], [place], [place] [address]    Special Requests   Final    BOTTLES DRAWN AEROBIC AND ANAEROBIC Blood Culture adequate  volume Performed at [facility] [address], [place], [place] [address]    Culture   Final    NO GROWTH 3 DAYS Performed at [facility], [place] [address] [place], [place] [address]    Report Status PENDING  Incomplete      Studies: No results found.  Scheduled Meds: . acetaminophen  650 mg Oral Q6H  . buPROPion  150 mg Oral Daily  . citalopram  20 mg Oral Daily  . diltiazem  120 mg Oral Daily  . feeding supplement  1 Container Oral TID WC  . feeding supplement  237 mL Oral BID BM  . furosemide  80 mg Oral Daily  . gabapentin  200 mg Oral BID  . guaiFENesin  1,200 mg Oral BID  . insulin aspart  0-15 Units Subcutaneous TID WC  . insulin aspart  0-5 Units Subcutaneous QHS  . ipratropium  0.5 mg Nebulization BID  . iron polysaccharides  150 mg Oral BID  . levalbuterol  0.63 mg Nebulization BID  . mouth rinse  15 mL Mouth Rinse BID  . pantoprazole  40 mg Oral BID  . potassium chloride  40 mEq Oral BID  . pravastatin  20 mg Oral q1800    Continuous Infusions: . sodium chloride 75 mL/hr at 09/10/17 1500  . sodium chloride    . aztreonam 0 g (09/10/17 0147)  . methocarbamol (ROBAXIN) IV    . vancomycin       LOS: 11 days     Alma Friendly, MD [facility]   If 7PM-7AM, please contact night-coverage [email or web address] Password [email or web address] 09/10/2017, 4:08 PM

## 2017-09-10 NOTE — Progress Notes (Signed)
Patient ID: Joel [name], male   DOB: 1933/01/28, 84 y.o.   MRN: [id number] 5 Days Post-Op   Subjective: Had several bloody bowel movements through the night and another this morning.  Feels bloated.  No severe pain.  Objective: Vital signs in last 24 hours: Temp:  [97.3 F (36.3 C)-98.9 F (37.2 C)] 97.6 F (36.4 C) (08/10 0600) Pulse Rate:  [81-121] 100 (08/10 0600) Resp:  [15-28] 16 (08/10 0600) BP: (107-156)/(47-118) 107/56 (08/10 0600) SpO2:  [90 %-100 %] 98 % (08/10 0600) Weight:  [105.3 kg] 105.3 kg (08/10 0307) Last BM Date: 09/10/17  Intake/Output from previous day: 08/09 0701 - 08/10 0700 In: 4264.6 [P.O.:120; I.V.:1312.2; Blood:2104; IV Piggyback:678.4] Out: 2409 [Urine:3600; Stool:175] Intake/Output this shift: No intake/output data recorded.  General appearance: alert, cooperative and no distress Resp: clear to auscultation bilaterally GI: Al distention.  Soft and nontender.  Gross bloody bowel movement this morning. Incision/Wound: Clean and dry  Lab Results:  Recent Labs    09/09/17 2329 09/10/17 0357  WBC 5.3 5.6  HGB 8.3* 7.2*  HCT 24.9* 21.3*  PLT 166 187   BMET Recent Labs    09/09/17 1516 09/09/17 1612 09/10/17 0357  NA 141 142 140  K 3.5 3.4* 3.2*  CL 107 101 107  CO2 28  --  26  GLUCOSE 151* 139* 139*  BUN 19 16 16   CREATININE 1.16 1.10 0.99  CALCIUM 8.0*  --  7.5*     Studies/Results: Ct Head Wo Contrast  Result Date: 09/08/2017 CLINICAL DATA:  Altered level of consciousness EXAM: CT HEAD WITHOUT CONTRAST TECHNIQUE: Contiguous axial images were obtained from the base of the skull through the vertex without intravenous contrast. COMPARISON:  01/27/16 FINDINGS: Brain: Mild atrophic changes and chronic white matter ischemic change is seen. No findings to suggest acute hemorrhage, acute infarction or space-occupying mass lesion are noted. Vascular: No hyperdense vessel or unexpected calcification. Skull: Normal. Negative for fracture or  focal lesion. Sinuses/Orbits: No acute finding. Other: None. IMPRESSION: Chronic atrophic and ischemic changes. No acute abnormality is seen. Electronically Signed   By: Inez Catalina M.D.   On: 09/08/2017 09:34   Dg Chest Port 1 View  Result Date: 09/08/2017 CLINICAL DATA:  Follow-up pneumonia EXAM: PORTABLE CHEST 1 VIEW COMPARISON:  09/07/2017 FINDINGS: Cardiac shadow is enlarged but stable. Aortic calcifications are again seen. Postsurgical changes are noted. Persistent left basilar infiltrate and effusion is seen. The right lung is stable. IMPRESSION: Stable changes in the left base. Electronically Signed   By: Inez Catalina M.D.   On: 09/08/2017 09:03    Anti-infectives: Anti-infectives (From admission, onward)   Start     Dose/Rate Route Frequency Ordered Stop   09/10/17 0745  ciprofloxacin (CIPRO) IVPB 400 mg  Status:  Discontinued     400 mg 200 mL/hr over 60 Minutes Intravenous  Once 09/10/17 0733 09/10/17 0735   09/10/17 0745  metroNIDAZOLE (FLAGYL) IVPB 500 mg  Status:  Discontinued     500 mg 100 mL/hr over 60 Minutes Intravenous  Once 09/10/17 0733 09/10/17 0735   09/09/17 1200  vancomycin (VANCOCIN) 1,250 mg in sodium chloride 0.9 % 250 mL IVPB     1,250 mg 166.7 mL/hr over 90 Minutes Intravenous Every 24 hours 09/09/17 0904     09/09/17 1100  vancomycin (VANCOCIN) IVPB 1000 mg/200 mL premix  Status:  Discontinued     1,000 mg 200 mL/hr over 60 Minutes Intravenous Every 24 hours 09/08/17 1050 09/09/17 0904  09/08/17 1200  vancomycin (VANCOCIN) 2,000 mg in sodium chloride 0.9 % 500 mL IVPB     2,000 mg 250 mL/hr over 120 Minutes Intravenous  Once 09/08/17 1050 09/08/17 1454   09/08/17 0100  aztreonam (AZACTAM) 2 g in sodium chloride 0.9 % 100 mL IVPB     2 g 200 mL/hr over 30 Minutes Intravenous Every 8 hours 09/07/17 1654     09/07/17 1645  aztreonam (AZACTAM) 2 g in sodium chloride 0.9 % 100 mL IVPB     2 g 200 mL/hr over 30 Minutes Intravenous STAT 09/07/17 1633 09/07/17  1853   09/05/17 1800  clindamycin (CLEOCIN) IVPB 900 mg     900 mg 100 mL/hr over 30 Minutes Intravenous Every 8 hours 09/05/17 1320 09/05/17 1826   09/05/17 0815  gentamicin (GARAMYCIN) 450 mg in dextrose 5 % 100 mL IVPB     5 mg/kg  90.1 kg (Adjusted) 111.3 mL/hr over 60 Minutes Intravenous To Short Stay 09/05/17 0749 09/05/17 1101   09/05/17 0748  clindamycin (CLEOCIN) IVPB 900 mg     900 mg 100 mL/hr over 30 Minutes Intravenous 60 min pre-op 09/05/17 0749 09/05/17 1006   08/31/17 1100  fluconazole (DIFLUCAN) tablet 100 mg     100 mg Oral Daily 08/31/17 0921 09/02/17 1726      Assessment/Plan: s/p Procedure(s): LAPAROSCOPIC RIGHT COLON RESECTION Postop bleeding likely from staple line.  This is continued despite reversal of anticoagulation and he is received now 6 units of PRBCs as well as FFP.  Signs of ongoing bleeding.  Remained stable.  I recommended proceeding to the operating room for laparotomy and control of bleeding, likely with resection of his anastomosis.  We discussed the nature of the surgery and indications and he understands and agrees.  Attempted to call granddaughter but no answer and I left message.   LOS: 11 days    Joel [name] 09/10/2017

## 2017-09-10 NOTE — Op Note (Signed)
Preoperative Diagnosis: Postoperative GI bleeding  Postoprative Diagnosis: Same  Procedure: Procedure(s): EXPLORATORY LAPAROTOMY, PARTIAL COLECTOMY   Surgeon: Excell Seltzer T   Assistants: Romana Juniper  Anesthesia:  General endotracheal anesthesia  Indications: Patient is 4 days status post right hemicolectomy for cancer of the colon.  He is chronically anticoagulated on Eliquis.  He initially did well without evidence of bleeding and his Eliquis was restarted after 3 days for 1 dose but he developed persistent lower GI bleeding which has continued for the past 24 hours requiring 6 unit transfusion and has ongoing rectal bleeding this morning.  Recently has bleeding from his recent anastomosis and I recommended laparotomy with resection of his anastomosis.  This was discussed with the patient and his daughter all questions answered and he agreed to proceed.    Procedure Detail: Patient was brought to the operating room, placed in supine position on the operating table, and general endotracheal anesthesia induced.  He was already on broad-spectrum IV antibiotics.  The abdomen was widely sterilely prepped and draped.  Patient timeout was performed and correct procedure verified.  Previous midline staples were removed and the wound bluntly open to the fascia.  Fascial sutures were removed and the abdomen entered.  There was a small amount of clear ascitic fluid no intraperitoneal hemorrhage.  Small bowel obstruction versus sigmoid colon and some loops of small bowel were noted to be moderately dilated.  The anastomosis was brought into the operative field with good exposure.  It appeared unremarkable externally.  I did open a portion of the anastomosis and inspect the staple line.  There was some mild diffuse oozing but I do not see any discrete bleeding point.  This enterotomy was closed with silk.  I then proceeded with resection of the anastomosis.  The omentum of the portion of colon to be  removed was divided with LigaSure.  The ileum was divided about 5 cm proximal to the anastomosis with GIA 75 mm stapler.  The transverse colon was divided with a similar distance from the anastomosis with the GIA-75.  The mesentery of the anastomosis and ileum and colon was divided with LigaSure and the specimen.  There was no bleeding from the mesentery.  The bowel was somewhat edematous but otherwise appeared healthy.  A functional end-to-end anastomosis was created with a single firing of the GIA-75 stapler.  The staple line was carefully inspected and there was no bleeding.  The common enterotomy was closed in 2 layers with running 2-0 Vicryl interrupted seromuscular 2-0 silk.  The crotch of the anastomosis was reinforced with silk.  It appeared widely patent less line.  The viscera returned to the anatomic position.  The midline fascia was closed with PDS incision and tied centrally.  Subcutaneous tissue was left open and packed with moist saline gauze.  Patient was returned to the ICU in critical but stable condition.    Findings: As above  Estimated Blood Loss:  less than 50 mL         Drains: None  Blood Given: 2 Units  PRBC          Specimens: Portion of terminal ileum and transverse colon with anastomosis        Complications:  * No complications entered in OR log *         Disposition: PACU - hemodynamically stable.         Condition: stable

## 2017-09-10 NOTE — Progress Notes (Signed)
Patient lethargic. Answers questions appropriately, but does not sustain eye contain for more than thirty seconds. PERRLA, following commands. On bipap. Holding 2200 medications, K Schorr notified at 2105.

## 2017-09-10 NOTE — Anesthesia Preprocedure Evaluation (Addendum)
Anesthesia Evaluation  Patient identified by MRN, date of birth, ID band Patient awake    Reviewed: Allergy & Precautions, NPO status , Patient's Chart, lab work & pertinent test results  Airway Mallampati: II  TM Distance: >3 FB     Dental   Pulmonary shortness of breath, sleep apnea , former smoker,    breath sounds clear to auscultation       Cardiovascular hypertension, +CHF   Rhythm:Regular Rate:Normal     Neuro/Psych    GI/Hepatic Neg liver ROS, History noted CG   Endo/Other  diabetes  Renal/GU negative Renal ROS     Musculoskeletal   Abdominal   Peds  Hematology  (+) anemia ,   Anesthesia Other Findings   Reproductive/Obstetrics                            Anesthesia Physical Anesthesia Plan  ASA: III  Anesthesia Plan: General   Post-op Pain Management:    Induction: Intravenous  PONV Risk Score and Plan: Ondansetron, Dexamethasone and Midazolam  Airway Management Planned: Oral ETT  Additional Equipment:   Intra-op Plan:   Post-operative Plan: Possible Post-op intubation/ventilation  Informed Consent: I have reviewed the patients History and Physical, chart, labs and discussed the procedure including the risks, benefits and alternatives for the proposed anesthesia with the patient or authorized representative who has indicated his/her understanding and acceptance.   Dental advisory given  Plan Discussed with: CRNA, Anesthesiologist and Surgeon  Anesthesia Plan Comments:        Anesthesia Quick Evaluation

## 2017-09-10 NOTE — Progress Notes (Signed)
Pharmacy Antibiotic Note  Joel [name] is a 84 y.o. male admitted on 08/30/2017 after PCP visit for low hemoglobin. Hemoglobin confirmed 7 and hemoccult was positive for blood. Patient had colonoscopy significant for cecal mass. Patient underwent surgical resection of adenocarcinoma laproscopically and primary repair of umbilical hernia on 8/5. Chest x-ray on 8/7 significant for new airspace consolidation left lower lobe with left pleural effusion. Patient started on aztreonam on 8/7 for suspected HCAP due to documented anaphylaxis to penicillin. Patient transferred to ICU on 8/8 secondary to AMS. Initiate vancomycin on 8/8 to provide Gram + coverage.   09/10/2017 -afebrile, WBC WNL -Scr trending down 1.48> 1.24> 0.99 -Blood cultures on 8/7 no growth thus far  Plan: ? De-escalate to levaquin -Continue aztreonam 2 g IV q8h - change vancomycin to 1500 mg IV q24 for AUC 462 (SCr 1, IBW/ABW) -Monitor: renal function, clinical status -F/u with culture results  -vancomycin levels as needed  Height: 6' (182.9 cm) Weight: 232 lb 2.3 oz (105.3 kg) IBW/kg (Calculated) : 77.6  Temp (24hrs), Avg:98.1 F (36.7 C), Min:97.3 F (36.3 C), Max:98.9 F (37.2 C)  Recent Labs  Lab 09/08/17 0540 09/09/17 0348 09/09/17 1516 09/09/17 1612 09/09/17 1948 09/09/17 2329 09/10/17 0357  WBC 5.6 4.9 5.2  --  4.6 5.3 5.6  CREATININE 1.48* 1.24 1.16 1.10  --   --  0.99    Estimated Creatinine Clearance: 72.2 mL/min (by C-G formula based on SCr of 0.99 mg/dL).    Allergies  Allergen Reactions  . Penicillins Anaphylaxis    Has patient had a PCN reaction causing immediate rash, facial/tongue/throat swelling, SOB or lightheadedness with hypotension: unknown Has patient had a PCN reaction causing severe rash involving mucus membranes or skin necrosis: unknown Has patient had a PCN reaction that required hospitalization: unknown Has patient had a PCN reaction occurring within the last 10 years: no If all of  the above answers are "NO", then may proceed with Cephalosporin use.   Marland Kitchen Antihistamines, Loratadine-Type Other (See Comments)    depression  . Other     Beta blockers-Novacaine  Caused depression    Antimicrobials this admission: 8/7 aztreonam>> 8/8 vancomycin>> Dose adjustments this admission: 8/8 change vanc 1 gm q24 to vanc 1250 q24 for improved renal function 8/10 change vanc 1250 q24 >> 1500 q24 for improved renal fxn Microbiology results: 8/7 BCx x2: NGTD 8/7 MRSA PCR: negative 8/5 Body FluidCx: fungus (likely contaminant)   Thank you for allowing pharmacy to be a part of this patient's care. Eudelia Bunch, Pharm.D [phone] 09/10/2017 9:03 AM

## 2017-09-10 NOTE — Transfer of Care (Signed)
Immediate Anesthesia Transfer of Care Note  Patient: Joel [name]  Procedure(s) Performed: EXPLORATORY LAPAROTOMY, PARTIAL COLECTOMY (N/A Abdomen)  Patient Location: PACU  Anesthesia Type:General  Level of Consciousness: awake, alert , oriented and patient cooperative  Airway & Oxygen Therapy: Patient Spontanous Breathing and Patient connected to face mask oxygen  Post-op Assessment: Report given to RN, Post -op Vital signs reviewed and stable and Patient moving all extremities X 4  Post vital signs: stable  Last Vitals:  Vitals Value Taken Time  BP 145/81 09/10/2017 11:15 AM  Temp 36.4 C 09/10/2017 11:12 AM  Pulse 96 09/10/2017 11:19 AM  Resp 22 09/10/2017 11:19 AM  SpO2 98 % 09/10/2017 11:19 AM  Vitals shown include unvalidated device data.  Last Pain:  Vitals:   09/10/17 0900  TempSrc: Oral  PainSc:       Patients Stated Pain Goal: 2 (27/03/50 0938)  Complications: No apparent anesthesia complications

## 2017-09-10 NOTE — Anesthesia Postprocedure Evaluation (Signed)
Anesthesia Post Note  Patient: Joel [name]  Procedure(s) Performed: EXPLORATORY LAPAROTOMY, PARTIAL COLECTOMY (N/A Abdomen)     Patient location during evaluation: PACU Anesthesia Type: General Level of consciousness: awake Pain management: pain level controlled Vital Signs Assessment: post-procedure vital signs reviewed and stable Respiratory status: spontaneous breathing Cardiovascular status: stable Anesthetic complications: no    Last Vitals:  Vitals:   09/10/17 1112 09/10/17 1115  BP: (!) 143/94 (!) 145/81  Pulse: (!) 107 (!) 116  Resp: (!) 23 (!) 22  Temp: (!) 36.4 C   SpO2: 99% 100%    Last Pain:  Vitals:   09/10/17 1112  TempSrc:   PainSc: Asleep                 Abed Schar

## 2017-09-10 NOTE — Progress Notes (Addendum)
Pt had large bright red BM that consisted almost entirely of blood. Several clots were noticed ranging in size from quarter sized to approximately half of a palm of a hand sized. This was the first BM this pt has had since his 2nd surgery. MD notified. Orders received to repeat H&H at 1800. Will continue to monitor.

## 2017-09-10 NOTE — Anesthesia Procedure Notes (Signed)
Procedure Name: Intubation Date/Time: 09/10/2017 9:42 AM Performed by: Lissa Morales, CRNA Pre-anesthesia Checklist: Patient identified, Emergency Drugs available, Suction available and Patient being monitored Patient Re-evaluated:Patient Re-evaluated prior to induction Oxygen Delivery Method: Circle system utilized Preoxygenation: Pre-oxygenation with 100% oxygen Induction Type: IV induction, Rapid sequence and Cricoid Pressure applied Laryngoscope Size: Mac and 4 Grade View: Grade II Tube type: Oral Tube size: 8.0 (taper ETT) mm Number of attempts: 1 Airway Equipment and Method: Stylet and Oral airway Placement Confirmation: ETT inserted through vocal cords under direct vision,  positive ETCO2 and breath sounds checked- equal and bilateral Secured at: 22 cm Tube secured with: Tape Dental Injury: Teeth and Oropharynx as per pre-operative assessment

## 2017-09-11 ENCOUNTER — Inpatient Hospital Stay (HOSPITAL_COMMUNITY): Payer: PPO

## 2017-09-11 ENCOUNTER — Encounter (HOSPITAL_COMMUNITY): Payer: Self-pay | Admitting: General Surgery

## 2017-09-11 LAB — CBC WITH DIFFERENTIAL/PLATELET
BASOS ABS: 0 10*3/uL (ref 0.0–0.1)
BASOS PCT: 0 %
EOS ABS: 0.1 10*3/uL (ref 0.0–0.7)
EOS PCT: 1 %
HCT: 27.9 % — ABNORMAL LOW (ref 39.0–52.0)
Hemoglobin: 9.2 g/dL — ABNORMAL LOW (ref 13.0–17.0)
LYMPHS ABS: 0.6 10*3/uL — AB (ref 0.7–4.0)
Lymphocytes Relative: 7 %
MCH: 29.9 pg (ref 26.0–34.0)
MCHC: 33 g/dL (ref 30.0–36.0)
MCV: 90.6 fL (ref 78.0–100.0)
Monocytes Absolute: 0.8 10*3/uL (ref 0.1–1.0)
Monocytes Relative: 9 %
Neutro Abs: 7.4 10*3/uL (ref 1.7–7.7)
Neutrophils Relative %: 83 %
Platelets: 182 10*3/uL (ref 150–400)
RBC: 3.08 MIL/uL — AB (ref 4.22–5.81)
RDW: 16.3 % — ABNORMAL HIGH (ref 11.5–15.5)
WBC: 8.8 10*3/uL (ref 4.0–10.5)

## 2017-09-11 LAB — BPAM FFP
BLOOD PRODUCT EXPIRATION DATE: 201908142359
Blood Product Expiration Date: 201908142359
ISSUE DATE / TIME: 201908091339
ISSUE DATE / TIME: 201908091339
UNIT TYPE AND RH: 5100
Unit Type and Rh: 5100

## 2017-09-11 LAB — BASIC METABOLIC PANEL
Anion gap: 6 (ref 5–15)
BUN: 16 mg/dL (ref 8–23)
CO2: 25 mmol/L (ref 22–32)
Calcium: 7.3 mg/dL — ABNORMAL LOW (ref 8.9–10.3)
Chloride: 114 mmol/L — ABNORMAL HIGH (ref 98–111)
Creatinine, Ser: 1.23 mg/dL (ref 0.61–1.24)
GFR calc Af Amer: >60 mL/min (ref >60)
GFR, EST NON AFRICAN AMERICAN: 53 mL/min — AB (ref >60)
Glucose, Bld: 135 mg/dL — ABNORMAL HIGH (ref 70–99)
POTASSIUM: 3.6 mmol/L (ref 3.5–5.1)
SODIUM: 145 mmol/L (ref 135–145)

## 2017-09-11 LAB — PREPARE FRESH FROZEN PLASMA
UNIT DIVISION: 0
Unit division: 0

## 2017-09-11 LAB — GLUCOSE, CAPILLARY
GLUCOSE-CAPILLARY: 144 mg/dL — AB (ref 70–99)
Glucose-Capillary: 123 mg/dL — ABNORMAL HIGH (ref 70–99)
Glucose-Capillary: 118 mg/dL — ABNORMAL HIGH (ref 70–99)
Glucose-Capillary: 171 mg/dL — ABNORMAL HIGH (ref 70–99)
Glucose-Capillary: 168 mg/dL — ABNORMAL HIGH (ref 70–99)

## 2017-09-11 LAB — HEMOGLOBIN AND HEMATOCRIT, BLOOD
HCT: 25.8 % — ABNORMAL LOW (ref 39.0–52.0)
HEMATOCRIT: 27.7 % — AB (ref 39.0–52.0)
HEMOGLOBIN: 8.7 g/dL — AB (ref 13.0–17.0)
HEMOGLOBIN: 9.1 g/dL — AB (ref 13.0–17.0)

## 2017-09-11 NOTE — Progress Notes (Signed)
PROGRESS NOTE  Joel [name] VZD:[id number] DOB: 02-Jan-1933 DOA: 08/30/2017 PCP: Wenda Low, MD  HPI/Recap of past 40 hours: 84 year old male with medical history significant for CAD, A. fib not on AC due to recurrent GI bleed from possible gastric AVMs, diabetes, hypertension, COPD, OSA, CKD, diastolic CHF presents to the ED complaining of generalized weakness, with abdominal bloating.  In the ED hemoglobin was noted to be 7, with FOBT positive.  GI consulted.  Patient admitted for further management.  Today, pt reported feeling better, no further bleeding noted. Reports passing gas, denies any worsening abdominal pain, fever/chills, cough, N/V.   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 Localized mild inflammation characterized by erosions and erythema was found in the duodenal bulb Colonoscopy revealed an ulcerated, likely malignant non-obstructing medium-sized mass in the cecum and a 6 mm polyp in the rectum that was sessile along with sigmoid colon diverticula CT abd/pelvis/chest showed: No evidence of metastatic disease on chest/abdomen/pelvis. Possible hepatic cirrhosis Pathology confirms adenocarcinoma Gen surg on board: S/P laparoscopic R colectomy and umbilical repair on 09/09/39 C/w iron supplementation Continue PPI Monitor closely  Lower GI bleed with acute drop in hemoglobin/Post op bleed s/p ex lap with partial colectomy on  09/10/17 Hgb drop to 6.6, with several episodes of BRBPR starting 09/09/17 Gen surg on board: S/P ex-lap with partial colectomy on 09/10/17 S/P 6U of PRBC + 2U of FFP + Kcentra since this admission IVF Held ppx lovenox for now Will not resume eliquis/NOACs due to significant bleeding. Will defer to cardiology follow up as an outpt to decide resumption Trend CBC  Acute metabolic encephalopathy Resolved  Unknown etiology, currently back to baseline No narcotics given ABG unremarkable CT head no acute intracranial abnormalities Continue IV AB, supplemental O2, cpap Monitor in SDU  AKI on CKD Stage 3 Resolved Baseline Cr around 1.4 Gentle hydration with IVF Daily BMP  HCAP Noted productive cough Afebrile, no leukocytosis Urine strep pneumo and legionella negative BC X 2 NGTD CXR showing airspace consolidation in LLL with L pleural effusion Continue Aztreonam for 5 days, d/c Vancomycin   Right Pleural Effusion s/p thoracentesis CT Scan showed Moderate Fluid Thoracentesis done, obtained 650 mL of gold fluid  C/w Diuresis, increased lasix to 80 mg po daily  Acute on Chronic Diastolic HF Improved ECHO with EF of 55% to 60%. The study isnot technically sufficient to allow evaluation of LV diastolic function. CXR initially showed Cardiomegaly and mild pulmonary edema, repeat showed pulmonary edema resolved Continue PO lasix 80 mg daily, monitor BMP closely Strict I's/O's, Daily Weights  Chronic respiratory failure/COPD on Home O2 C/w 2 liters of O2 via [place]  Continue Xopenex/Atrovent, mucinex Flutter valve, incentive spirometry  Persistent Atrial Fibrillation  S/P Multiple DCCV Rate controlled CHADS2VASc: 4 but not on AC due chronic GIB but now cardiology recommending starting the patient on Eliquis 5 mg twice daily once felt that he is stable from a surgical standpoint Will not  resume eliquis/NOACs due to significant bleeding. 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 HbA1c was 6.0 Continue SSI, accuchecks Continue gabapentin Hold Metformin 1000 mg po BID and Glimepiride 1 mg po Daily  HTN C/w Diltiazem 120 mg po Daily Hold Losartan 25 mg po Daily  OSA Ordered CPAP  HLD/Dyslipidemia  C/w statin  Gout Hold home Allopurinol 100 mg po Daily   BPPV Hold home Meclizine 25 mg po TIDprn  Bipolar Affective Disorder C/w Citalopram 20 mg po Daily and Bupropion 150 mg po Daily  C/w Temazepam 15 mg po qHS        Code Status: Full  Family Communication: None at bedside  Disposition Plan: TBD once more stable   Consultants:  Gen surg  Cardiology  GI  Procedures:  R colectomy and umbilical repair   Ex lap, with partial colectomy  Antimicrobials:  IV Aztreonam  DVT prophylaxis:  Held Lovenox due to GIB   Objective: Vitals:   09/11/17 0700 09/11/17 0800 09/11/17 0900 09/11/17 1200  BP: (!) 108/59 (!) 126/52 (!) 123/51 (!) 119/48  Pulse: 82 (!) 106 (!) 101 79  Resp: (!) 23 (!) 23 (!) 23 (!) 21  Temp:  97.6 F (36.4 C)  97.6 F (36.4 C)  TempSrc:  Oral  Oral  SpO2: 99% 100% 100% 99%  Weight:      Height:        Intake/Output Summary (Last 24 hours) at 09/11/2017 1348 Last data filed at 09/11/2017 1200 Gross per 24 hour  Intake 1494.65 ml  Output 1475 ml  Net 19.65 ml   Filed Weights   09/09/17 0400 09/10/17 0307 09/11/17 0332  Weight: 105.3 kg 105.3 kg 108.1 kg    Exam:   General:  NAD, chronically ill-appearing  Cardiovascular: S1, S2 present  Respiratory: Diminished breath sounds bilaterally  Abdomen: Large dressing covering the entire abdomen, C/D/I, distended, hypoactive BS  Musculoskeletal: No pedal edema  bilaterally  Skin: Normal  Psychiatry: Normal mood   Data Reviewed: CBC: Recent Labs  Lab 09/09/17 0348  09/09/17 1516  09/09/17 1948 09/09/17 2329 09/10/17 0357 09/10/17 1047 09/10/17 1246 09/10/17 1822 09/11/17 0024 09/11/17 0646  WBC 4.9  --  5.2  --  4.6 5.3 5.6  --   --   --  8.8  --   NEUTROABS 3.9  --  3.8  --  3.3  --  4.2  --   --   --  7.4  --   HGB 7.3*   < > 8.5*   < > 9.1* 8.3* 7.2* 8.2* 10.1* 10.2* 9.2* 8.7*  HCT 23.7*   < > 25.7*   < > 27.2* 24.9* 21.3* 24.0* 30.0* 30.2* 27.9* 25.8*  MCV 92.6  --  90.5  --  90.1 89.9 89.5  --   --   --  90.6  --   PLT 201  --  211  --  181 166 187  --   --   --  182  --    < > = values in this interval not displayed.   Basic Metabolic Panel: Recent Labs  Lab 09/05/17 0504  09/06/17 0318  09/08/17 0540 09/09/17 0348 09/09/17 1516 09/09/17 1612 09/10/17  4098 09/10/17 1047 09/11/17 0024  NA 141   < > 139   < > 139 142 141 142 140 143 145  K 4.1   < > 5.0   < > 3.6 3.3* 3.5 3.4* 3.2* 3.6 3.6  CL 103   < > 103   < > 104 106 107 101 107  --  114*  CO2 30   < > 26   < > 26 28 28   --  26  --  25  GLUCOSE 125*   < > 156*   < > 137* 134* 151* 139* 139* 132* 135*  BUN 15   < > 18   < > 26* 22 19 16 16   --  16  CREATININE 1.25*   < > 1.61*   < > 1.48* 1.24 1.16 1.10 0.99  --  1.23  CALCIUM 9.2   < > 8.9   < > 9.0 8.5* 8.0*  --  7.5*  --  7.3*  MG 2.1  --  2.0  --   --   --   --   --   --   --   --   PHOS 4.0  --  4.5  --   --   --   --   --   --   --   --    < > = values in this interval not displayed.   GFR: Estimated Creatinine Clearance: 58.8 mL/min (by C-G formula based on SCr of 1.23 mg/dL). Liver Function Tests: Recent Labs  Lab 09/05/17 0504 09/06/17 0318  AST 22 15  ALT 10 11  ALKPHOS 57 53  BILITOT 0.6 0.7  PROT 6.7 6.7  ALBUMIN 3.6 3.4*   No results for input(s): LIPASE, AMYLASE in the last 168 hours. No results for input(s): AMMONIA in the last 168 hours. Coagulation Profile: Recent Labs  Lab  09/09/17 1609 09/10/17 0357  INR 1.27 1.41   Cardiac Enzymes: No results for input(s): CKTOTAL, CKMB, CKMBINDEX, TROPONINI in the last 168 hours. BNP (last 3 results) No results for input(s): PROBNP in the last 8760 hours. HbA1C: No results for input(s): HGBA1C in the last 72 hours. CBG: Recent Labs  Lab 09/10/17 2124 09/10/17 2325 09/11/17 0313 09/11/17 0723 09/11/17 1154  GLUCAP 132* 119* 123* 118* 171*   Lipid Profile: No results for input(s): CHOL, HDL, LDLCALC, TRIG, CHOLHDL, LDLDIRECT in the last 72 hours. Thyroid Function Tests: No results for input(s): TSH, T4TOTAL, FREET4, T3FREE, THYROIDAB in the last 72 hours. Anemia Panel: No results for input(s): VITAMINB12, FOLATE, FERRITIN, TIBC, IRON, RETICCTPCT in the last 72 hours. Urine analysis:    Component Value Date/Time   COLORURINE YELLOW 01/27/2016 1730   APPEARANCEUR CLOUDY (A) 01/27/2016 1730   LABSPEC 1.019 01/27/2016 1730   PHURINE 5.0 01/27/2016 1730   GLUCOSEU NEGATIVE 01/27/2016 1730   HGBUR NEGATIVE 01/27/2016 1730   BILIRUBINUR NEGATIVE 01/27/2016 1730   KETONESUR 5 (A) 01/27/2016 1730   PROTEINUR NEGATIVE 01/27/2016 1730   UROBILINOGEN 0.2 10/22/2014 2136   NITRITE NEGATIVE 01/27/2016 1730   LEUKOCYTESUR NEGATIVE 01/27/2016 1730   Sepsis Labs: @LABRCNTIP (procalcitonin:4,lacticidven:4)  ) Recent Results (from the past 240 hour(s))  Body fluid culture     Status: Abnormal   Collection Time: 09/03/17  3:54 PM  Result Value Ref Range Status   Specimen Description   Final    PLEURAL RIGHT Performed at [facility], [place] [address] [place], [name] [address]    Special  Requests   Final    NONE Performed at [facility], [place] [address], [place], [place] [address]    Gram Stain   Final    WBC PRESENT,BOTH PMN AND MONONUCLEAR NO ORGANISMS SEEN CYTOSPIN SMEAR    Culture (A)  Final    FUNGUS (MOLD) ISOLATED, PROBABLE CONTAMINANT/COLONIZER (SAPROPHYTE).  CONTACT MICROBIOLOGY IF FURTHER IDENTIFICATION REQUIRED [phone]. CRITICAL RESULT CALLED TO, READ BACK BY AND VERIFIED WITH: Cannon Kettle RN, AT [phone] 09/06/17 BY D. VANHOOK REGARDING CULTURE GROWTH Performed at [facility], [place] [address], [place], [place] [address]    Report Status 09/07/2017 FINAL  Final  Surgical pcr screen     Status: None   Collection Time: 09/05/17  8:26 AM  Result Value Ref Range Status   MRSA, PCR NEGATIVE NEGATIVE Final   Staphylococcus aureus NEGATIVE NEGATIVE Final    Comment: (NOTE) The Xpert SA Assay (FDA approved for NASAL specimens in patients 72 years of age and older), is one component of a comprehensive surveillance program. It is not intended to diagnose infection nor to guide or monitor treatment. Performed at [facility], [place] [address], [place], [place] [address]   Culture, blood (routine x 2)     Status: None (Preliminary result)   Collection Time: 09/07/17  4:34 PM  Result Value Ref Range Status   Specimen Description   Final    BLOOD LEFT ANTECUBITAL Performed at [place] [address], [place], [place] [address]    Special Requests   Final    BOTTLES DRAWN AEROBIC AND ANAEROBIC Blood Culture adequate volume Performed at [place] [address] [place], [place] [address]    Culture   Final    NO GROWTH 3 DAYS Performed at [facility], [place] [address] [place], [place] [address]    Report Status PENDING  Incomplete  Culture, blood (routine x 2)     Status: None (Preliminary result)   Collection Time: 09/07/17  4:40 PM  Result Value Ref Range Status   Specimen Description   Final    BLOOD BLOOD RIGHT HAND Performed at [place] [address] [place], [place] [address]    Special Requests   Final    BOTTLES DRAWN AEROBIC AND ANAEROBIC Blood Culture adequate volume Performed at [place] [address] [place], [place] [address]    Culture   Final    NO GROWTH 3 DAYS Performed at [facility], [place] [address] [place], [place] [address]    Report Status PENDING  Incomplete      Studies: Dg Chest Port 1 View  Result Date: 09/11/2017 CLINICAL DATA:  Of care associated pneumonia EXAM: PORTABLE CHEST 1 VIEW COMPARISON:  Three days ago FINDINGS: Chronic cardiomegaly and vascular pedicle widening. Stable mild asymmetric opacification at the left base where there is volume loss. Congested appearance of vessels. IMPRESSION: 1. Cardiomegaly and vascular congestion. 2. No focal opacity in the frontal projection. Electronically Signed   By: Monte Fantasia M.D.   On: 09/11/2017 07:12    Scheduled Meds: . acetaminophen  650 mg Oral Q6H  . buPROPion  150 mg Oral Daily  . citalopram  20 mg Oral Daily  . diltiazem  120 mg Oral Daily  . feeding supplement  1 Container Oral TID WC  . feeding supplement  237 mL Oral BID BM  . furosemide  80 mg Oral Daily  .  gabapentin  200 mg Oral BID  . guaiFENesin  1,200 mg Oral BID  . insulin aspart  0-15 Units Subcutaneous TID WC  . insulin aspart  0-5 Units Subcutaneous QHS  . ipratropium  0.5 mg Nebulization BID  . iron polysaccharides  150 mg Oral BID  . levalbuterol  0.63 mg Nebulization BID  . mouth rinse  15 mL Mouth Rinse BID  . pantoprazole  40 mg Oral BID  . pravastatin  20 mg Oral q1800    Continuous Infusions: . sodium chloride 75 mL/hr at 09/11/17 0900  . sodium chloride    . aztreonam Stopped (09/11/17 0850)  . methocarbamol (ROBAXIN) IV       LOS: 12 days     Alma Friendly, MD [facility]   If 7PM-7AM, please contact night-coverage [email or web address] Password [email or web address] 09/11/2017, 1:48 PM

## 2017-09-11 NOTE — Progress Notes (Signed)
Pt was able to use the [facility]. Pt had another moderate amount BM that consisted of bright red blood with a handful of clots ranging from dime to nickel sized. This is the 2nd BM since surgery on 09/10/2017. MD notified. Orders received for H&H. Will continue to monitor.

## 2017-09-11 NOTE — Progress Notes (Signed)
Progress Note: General Surgery Service   Assessment/Plan: Patient Active Problem List   Diagnosis Date Noted  . Pressure injury of skin 09/07/2017  . S/P colon resection 09/05/2017  . Cancer of ascending colon ([place]) 09/05/2017  . Dyspnea 09/01/2017  . Blood loss anemia 08/30/2017  . Closed displaced fracture of right femoral neck ([place]) 03/22/2017  . Cough 01/17/2017  . Persistent atrial fibrillation ([place]) 01/17/2017  . Leg swelling 09/26/2016  . GI bleed 05/15/2016  . UGI bleed 05/14/2016  . COPD (chronic obstructive pulmonary disease) ([place]) 05/14/2016  . Chronic GI bleeding 04/12/2016  . HLD (hyperlipidemia) 04/12/2016  . Anemia due to chronic blood loss 04/12/2016  . Diabetes mellitus with complication ([place])   . BPPV (benign paroxysmal positional vertigo) 01/27/2016  . Hypertension 01/27/2016  . Malignant neoplasm of prostate ([place]) 01/07/2016  . Paroxysmal atrial fibrillation (HCC)   . Bipolar I disorder ([place])   . Bright red rectal bleeding 12/16/2015  . Rectal bleeding 12/16/2015  . Pulmonary hypertension ([place]) 06/29/2015  . Moderate aortic stenosis 04/17/2013  . Encounter for monitoring Coumadin therapy 04/17/2013  . Anemia, iron deficiency 04/04/2013  . Dyspnea on exertion 11/18/2012  . Obesity hypoventilation syndrome ([place]) 09/19/2012  . ALLERGIC RHINITIS 06/24/2009  . DM2 (diabetes mellitus, type 2) ([place]) 03/13/2007  . OSA (obstructive sleep apnea) 03/13/2007  . COPD mixed type ([place]) 03/13/2007   s/p Procedure(s): EXPLORATORY LAPAROTOMY, PARTIAL COLECTOMY 09/10/2017 R hemicolectomy for mass with complication of bleeding resulting in reexploration and removal of anastomosis -ok for liquids -continue to check hemoglobins   LOS: 12 days  Chief Complaint/Subjective: Pain controlled, +flatus, no nausea  Objective: Vital signs in last 24 hours: Temp:  [97.4 F (36.3 C)-101.7 F (38.7 C)] 97.4 F (36.3 C) (08/11 0332) Pulse Rate:  [79-116] 82 (08/11  0700) Resp:  [14-33] 23 (08/11 0700) BP: (92-145)/(46-94) 108/59 (08/11 0700) SpO2:  [92 %-100 %] 99 % (08/11 0700) Weight:  [108.1 kg] 108.1 kg (08/11 0332) Last BM Date: 09/10/17  Intake/Output from previous day: 08/10 0701 - 08/11 0700 In: 5236 [I.V.:3766; Blood:1375; IV Piggyback:95] Out: 1500 [Urine:1450; Blood:50] Intake/Output this shift: No intake/output data recorded.  Lungs: CTAB  Cardiovascular: irreg irreg  Abd: soft, ATTP, incision open with clean base, no erythema  Extremities: no edema  Neuro: AOx4  Lab Results: CBC  Recent Labs    09/10/17 0357  09/11/17 0024 09/11/17 0646  WBC 5.6  --  8.8  --   HGB 7.2*   < > 9.2* 8.7*  HCT 21.3*   < > 27.9* 25.8*  PLT 187  --  182  --    < > = values in this interval not displayed.   BMET Recent Labs    09/10/17 0357 09/10/17 1047 09/11/17 0024  NA 140 143 145  K 3.2* 3.6 3.6  CL 107  --  114*  CO2 26  --  25  GLUCOSE 139* 132* 135*  BUN 16  --  16  CREATININE 0.99  --  1.23  CALCIUM 7.5*  --  7.3*   PT/INR Recent Labs    09/09/17 1609 09/10/17 0357  LABPROT 15.8* 17.2*  INR 1.27 1.41   ABG Recent Labs    09/08/17 0810  PHART 7.429  HCO3 25.5    Studies/Results:  Anti-infectives: Anti-infectives (From admission, onward)   Start     Dose/Rate Route Frequency Ordered Stop   09/10/17 1200  vancomycin (VANCOCIN) 1,500 mg in sodium chloride 0.9 % 500 mL IVPB  1,500 mg 250 mL/hr over 120 Minutes Intravenous Every 24 hours 09/10/17 0905     09/10/17 0745  ciprofloxacin (CIPRO) IVPB 400 mg  Status:  Discontinued     400 mg 200 mL/hr over 60 Minutes Intravenous  Once 09/10/17 0733 09/10/17 0735   09/10/17 0745  metroNIDAZOLE (FLAGYL) IVPB 500 mg  Status:  Discontinued     500 mg 100 mL/hr over 60 Minutes Intravenous  Once 09/10/17 0733 09/10/17 0735   09/09/17 1200  vancomycin (VANCOCIN) 1,250 mg in sodium chloride 0.9 % 250 mL IVPB  Status:  Discontinued     1,250 mg 166.7 mL/hr over 90  Minutes Intravenous Every 24 hours 09/09/17 0904 09/10/17 0905   09/09/17 1100  vancomycin (VANCOCIN) IVPB 1000 mg/200 mL premix  Status:  Discontinued     1,000 mg 200 mL/hr over 60 Minutes Intravenous Every 24 hours 09/08/17 1050 09/09/17 0904   09/08/17 1200  vancomycin (VANCOCIN) 2,000 mg in sodium chloride 0.9 % 500 mL IVPB     2,000 mg 250 mL/hr over 120 Minutes Intravenous  Once 09/08/17 1050 09/08/17 1454   09/08/17 0100  aztreonam (AZACTAM) 2 g in sodium chloride 0.9 % 100 mL IVPB     2 g 200 mL/hr over 30 Minutes Intravenous Every 8 hours 09/07/17 1654     09/07/17 1645  aztreonam (AZACTAM) 2 g in sodium chloride 0.9 % 100 mL IVPB     2 g 200 mL/hr over 30 Minutes Intravenous STAT 09/07/17 1633 09/07/17 1853   09/05/17 1800  clindamycin (CLEOCIN) IVPB 900 mg     900 mg 100 mL/hr over 30 Minutes Intravenous Every 8 hours 09/05/17 1320 09/05/17 1826   09/05/17 0815  gentamicin (GARAMYCIN) 450 mg in dextrose 5 % 100 mL IVPB     5 mg/kg  90.1 kg (Adjusted) 111.3 mL/hr over 60 Minutes Intravenous To Short Stay 09/05/17 0749 09/05/17 1101   09/05/17 0748  clindamycin (CLEOCIN) IVPB 900 mg     900 mg 100 mL/hr over 30 Minutes Intravenous 60 min pre-op 09/05/17 0749 09/05/17 1006   08/31/17 1100  fluconazole (DIFLUCAN) tablet 100 mg     100 mg Oral Daily 08/31/17 0921 09/02/17 1726      Medications: Scheduled Meds: . acetaminophen  650 mg Oral Q6H  . buPROPion  150 mg Oral Daily  . citalopram  20 mg Oral Daily  . diltiazem  120 mg Oral Daily  . feeding supplement  1 Container Oral TID WC  . feeding supplement  237 mL Oral BID BM  . furosemide  80 mg Oral Daily  . gabapentin  200 mg Oral BID  . guaiFENesin  1,200 mg Oral BID  . insulin aspart  0-15 Units Subcutaneous TID WC  . insulin aspart  0-5 Units Subcutaneous QHS  . ipratropium  0.5 mg Nebulization BID  . iron polysaccharides  150 mg Oral BID  . levalbuterol  0.63 mg Nebulization BID  . mouth rinse  15 mL Mouth  Rinse BID  . pantoprazole  40 mg Oral BID  . potassium chloride  40 mEq Oral BID  . pravastatin  20 mg Oral q1800   Continuous Infusions: . sodium chloride 75 mL/hr at 09/11/17 0330  . sodium chloride    . aztreonam Stopped (09/11/17 0133)  . methocarbamol (ROBAXIN) IV    . vancomycin     PRN Meds:.fluticasone, HYDROmorphone (DILAUDID) injection, methocarbamol (ROBAXIN) IV, ondansetron **OR** ondansetron (ZOFRAN) IV, oxyCODONE  Mickeal Skinner, MD Pg# [phone] [facility]

## 2017-09-12 LAB — BASIC METABOLIC PANEL
ANION GAP: 7 (ref 5–15)
BUN: 17 mg/dL (ref 8–23)
CO2: 23 mmol/L (ref 22–32)
CREATININE: 1.13 mg/dL (ref 0.61–1.24)
Calcium: 7.4 mg/dL — ABNORMAL LOW (ref 8.9–10.3)
Chloride: 112 mmol/L — ABNORMAL HIGH (ref 98–111)
GFR, EST NON AFRICAN AMERICAN: 59 mL/min — AB (ref >60)
Glucose, Bld: 140 mg/dL — ABNORMAL HIGH (ref 70–99)
Potassium: 3.1 mmol/L — ABNORMAL LOW (ref 3.5–5.1)
SODIUM: 142 mmol/L (ref 135–145)

## 2017-09-12 LAB — CBC WITH DIFFERENTIAL/PLATELET
BASOS ABS: 0 10*3/uL (ref 0.0–0.1)
BASOS PCT: 0 %
EOS ABS: 0.4 10*3/uL (ref 0.0–0.7)
Eosinophils Relative: 5 %
HCT: 24.4 % — ABNORMAL LOW (ref 39.0–52.0)
Hemoglobin: 8 g/dL — ABNORMAL LOW (ref 13.0–17.0)
Lymphocytes Relative: 8 %
Lymphs Abs: 0.5 10*3/uL — ABNORMAL LOW (ref 0.7–4.0)
MCH: 30 pg (ref 26.0–34.0)
MCHC: 32.8 g/dL (ref 30.0–36.0)
MCV: 91.4 fL (ref 78.0–100.0)
MONO ABS: 0.6 10*3/uL (ref 0.1–1.0)
MONOS PCT: 9 %
NEUTROS ABS: 5.6 10*3/uL (ref 1.7–7.7)
NEUTROS PCT: 78 %
Platelets: 221 10*3/uL (ref 150–400)
RBC: 2.67 MIL/uL — ABNORMAL LOW (ref 4.22–5.81)
RDW: 16.7 % — AB (ref 11.5–15.5)
WBC: 7.1 10*3/uL (ref 4.0–10.5)

## 2017-09-12 LAB — GLUCOSE, CAPILLARY
Glucose-Capillary: 117 mg/dL — ABNORMAL HIGH (ref 70–99)
Glucose-Capillary: 113 mg/dL — ABNORMAL HIGH (ref 70–99)
Glucose-Capillary: 125 mg/dL — ABNORMAL HIGH (ref 70–99)
Glucose-Capillary: 132 mg/dL — ABNORMAL HIGH (ref 70–99)

## 2017-09-12 LAB — CULTURE, BLOOD (ROUTINE X 2)
CULTURE: NO GROWTH
Culture: NO GROWTH
SPECIAL REQUESTS: ADEQUATE
Special Requests: ADEQUATE

## 2017-09-12 MED ORDER — POTASSIUM CHLORIDE CRYS ER 20 MEQ PO TBCR
40.0000 meq | EXTENDED_RELEASE_TABLET | Freq: Once | ORAL | Status: AC
  Administered 2017-09-12: 40 meq via ORAL
  Filled 2017-09-12: qty 2

## 2017-09-12 NOTE — Progress Notes (Signed)
Occupational Therapy Treatment Patient Details Name: Joel [name] MRN: [id number] DOB: [birth year] Today's Date: 09/12/2017    History of present illness 84 y.o. male is s/p laparoscopic R colectomy and repair of umbilical due to colon CA. On 8/10, back to OR for re-exploration and removal of anastomosis.  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 Surgical history of R hip cannulated pinning in 03/2017, R shoulder surgery in 2011/2012, and electrotherapy for depression in 1969.    OT comments  Pt presents sitting up in recliner pleasant and agreeable to therapy session. Focus of session on UB/LB strengthening and increasing activity tolerance as precursor to ADL and completion of functional mobility. Pt performing sit<>stand (x5 total during session) at RW with overall minA and intermittent seated rest breaks. Completed additional UB strengthening exercises. Pt with difficulty reaching LEs for completing LB ADLs, will benefit from education on AE for increasing independence with these tasks. Will continue to follow acutely to progress pt towards established OT goals.   Follow Up Recommendations  Supervision/Assistance - 24 hour    Equipment Recommendations  None recommended by OT          Precautions / Restrictions Precautions Precautions: Fall Precaution Comments: recent ABD surgery; R knee instability per pt Restrictions Weight Bearing Restrictions: No       Mobility Bed Mobility Overal bed mobility: Needs Assistance Bed Mobility: Supine to Sit     Supine to sit: Mod assist;HOB elevated     General bed mobility comments: OOB in recliner upon arrival   Transfers Overall transfer level: Needs assistance Equipment used: Rolling walker (2 wheeled) Transfers: Sit to/from Stand Sit to Stand: Min assist         General transfer comment: Assist to rise, stabilize, control descent. VCs safety, hand placement initially  with good carryover     Balance Overall balance assessment: Needs assistance;History of Falls         Standing balance support: Bilateral upper extremity supported Standing balance-Leahy Scale: Poor                             ADL either performed or assessed with clinical judgement   ADL Overall ADL's : Needs assistance/impaired Eating/Feeding: Set up;Sitting                   Lower Body Dressing: Maximal assistance;Sit to/from stand Lower Body Dressing Details (indicate cue type and reason): requires assist to adjust socks today; reports he cannot reach feet for LB ADLs              Functional mobility during ADLs: Minimal assistance;Rolling walker;Min guard(sit<>stand) General ADL Comments: focus of session on UB/LB strengthening. Pt completing multiple sit<>stands at RW with light minA, seated UB exercise with intermittent rest breaks, VSS                       Cognition Arousal/Alertness: Awake/alert Behavior During Therapy: WFL for tasks assessed/performed Overall Cognitive Status: Within Functional Limits for tasks assessed                                          Exercises General Exercises - Upper Extremity Shoulder Flexion: AROM;10 reps;Both;Seated;Other (comment)(provided light resistance for increased strengthening) Shoulder Extension: AROM;Both;10 reps;Seated;Other (comment)(provided light resistance for increased  strengthening)   Shoulder Instructions       General Comments      Pertinent Vitals/ Pain       Pain Assessment: No/denies pain  Home Living                                          Prior Functioning/Environment              Frequency  Min 2X/week        Progress Toward Goals  OT Goals(current goals can now be found in the care plan section)  Progress towards OT goals: Progressing toward goals  Acute Rehab OT Goals Patient Stated Goal: return to independence;  home OT Goal Formulation: With patient Time For Goal Achievement: 09/20/17 Potential to Achieve Goals: Good  Plan Discharge plan remains appropriate    Co-evaluation                 AM-PAC PT "6 Clicks" Daily Activity     Outcome Measure   Help from another person eating meals?: None Help from another person taking care of personal grooming?: A Little Help from another person toileting, which includes using toliet, bedpan, or urinal?: A Lot Help from another person bathing (including washing, rinsing, drying)?: A Lot Help from another person to put on and taking off regular upper body clothing?: A Little Help from another person to put on and taking off regular lower body clothing?: A Lot 6 Click Score: 16    End of Session Equipment Utilized During Treatment: Rolling walker  OT Visit Diagnosis: Muscle weakness (generalized) (M62.81);Unsteadiness on feet (R26.81)   Activity Tolerance Patient tolerated treatment well   Patient Left in chair;with call bell/phone within reach;with chair alarm set   Nurse Communication Mobility status        Time: 3825-0539 OT Time Calculation (min): 22 min  Charges: OT General Charges $OT Visit: 1 Visit OT Treatments $Therapeutic Activity: 8-22 mins  Joel [name], OT Pager [phone] 09/12/2017    Joel [name] 09/12/2017, 1:05 PM

## 2017-09-12 NOTE — Progress Notes (Signed)
PROGRESS NOTE  Joel [name] KTG:[id number] DOB: 12-05-1932 DOA: 08/30/2017 PCP: Wenda Low, MD  HPI/Recap of past 55 hours: 85 year old male with medical history significant for CAD, A. fib not on AC due to recurrent GI bleed from possible gastric AVMs, diabetes, hypertension, COPD, OSA, CKD, diastolic CHF presents to the ED complaining of generalized weakness, with abdominal bloating.  In the ED hemoglobin was noted to be 7, with FOBT positive.  GI consulted.  Patient admitted for further management.  Today, pt reported feeling much better, no further bleeding noted this am. Reports passing gas, denies any worsening abdominal pain, fever/chills, cough, N/V. Very grateful for the medical care he has received so far.   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 Localized mild inflammation characterized by erosions and erythema was found in the duodenal bulb Colonoscopy revealed an ulcerated, likely malignant non-obstructing medium-sized mass in the cecum and a 6 mm polyp in the rectum that was sessile along with sigmoid colon diverticula CT abd/pelvis/chest showed: No evidence of metastatic disease on chest/abdomen/pelvis. Possible hepatic cirrhosis Pathology confirms adenocarcinoma Gen surg on board: S/P laparoscopic R colectomy and umbilical repair on 07/09/09 C/w iron supplementation Continue PPI Monitor closely  Lower GI bleed with acute  drop in hemoglobin/Post op bleed s/p ex lap with partial colectomy on 09/10/17 Hgb drop to 6.6, with several episodes of BRBPR starting 09/09/17 Gen surg on board: S/P ex-lap with partial colectomy on 09/10/17 S/P 6U of PRBC + 2U of FFP + Kcentra since this admission IVF Held ppx lovenox for now Will not resume eliquis/NOACs due to significant bleeding. Will defer to cardiology follow up as an outpt to decide resumption Trend CBC  Hypokalemia Replace PRN  Acute metabolic encephalopathy Resolved  Unknown etiology, currently back to baseline No narcotics given ABG unremarkable CT head no acute intracranial abnormalities Continue IV AB, supplemental O2, cpap Monitor in SDU  AKI on CKD Stage 3 Resolved Baseline Cr around 1.4 Gentle hydration with IVF Daily BMP  HCAP Noted productive cough Afebrile, no leukocytosis Urine strep pneumo and legionella negative BC X 2 NGTD CXR showing airspace consolidation in LLL with L pleural effusion S/P Vancomycin, and Aztreonam for 5 days total  Right Pleural Effusion s/p thoracentesis CT Scan showed Moderate Fluid Thoracentesis done, obtained 650 mL of gold fluid  C/w Diuresis, increased lasix to 80 mg po daily  Acute on Chronic Diastolic HF Improved ECHO with EF of 55% to 60%. The study isnot technically sufficient to allow evaluation of LV diastolic function. CXR initially showed Cardiomegaly and mild pulmonary edema, repeat showed pulmonary edema resolved Continue PO lasix 80 mg daily, monitor BMP closely Strict I's/O's, Daily Weights  Chronic respiratory failure/COPD on Home O2 C/w 2 liters of O2 via [place]  Continue Xopenex/Atrovent, mucinex Flutter valve, incentive spirometry  Persistent Atrial Fibrillation  S/P Multiple DCCV Rate controlled CHADS2VASc: 4 but not on AC due chronic GIB but now cardiology recommending starting the patient  on Eliquis 5 mg twice daily once felt that he is stable from a surgical standpoint Will not  resume eliquis/NOACs due to significant bleeding. 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 HbA1c was 6.0 Continue SSI, accuchecks Continue gabapentin Hold Metformin 1000 mg po BID and Glimepiride 1 mg po Daily  HTN C/w Diltiazem 120 mg po Daily Hold Losartan 25 mg po Daily  OSA Ordered CPAP  HLD/Dyslipidemia  C/w statin  Gout Hold home Allopurinol 100 mg po Daily   BPPV Hold home Meclizine 25 mg po TIDprn  Bipolar Affective Disorder C/w Citalopram 20 mg po Daily and Bupropion 150 mg po Daily  C/w Temazepam 15 mg po qHS        Code Status: Full  Family Communication: None at bedside  Disposition Plan: Likely SNF   Consultants:  Gen surg  Cardiology  GI  Procedures:  R colectomy and umbilical repair   Ex lap, with partial colectomy  Antimicrobials:  None currently  DVT prophylaxis:  Held Lovenox due to GIB   Objective: Vitals:   09/12/17 0700 09/12/17 0800 09/12/17 1017 09/12/17 1200  BP:  130/73 137/84   Pulse: 95 88    Resp: 18 20    Temp:  98.7 F (37.1 C)  97.6 F (36.4 C)  TempSrc:  Oral  Oral  SpO2: 99% 100%    Weight:      Height:        Intake/Output Summary (Last 24 hours) at 09/12/2017 1340 Last data filed at 09/12/2017 0600 Gross per 24 hour  Intake 1349.69 ml  Output 350 ml  Net 999.69 ml   Filed Weights   09/10/17 0307 09/11/17 0332 09/12/17 0500  Weight: 105.3 kg 108.1 kg 109.5 kg    Exam:   General:  NAD, chronically ill-appearing  Cardiovascular: S1, S2 present  Respiratory: Diminished breath sounds bilaterally  Abdomen: Soft, NT, dressing C/D/I, distended, +BS  Musculoskeletal: No pedal edema bilaterally  Skin:  Normal  Psychiatry: Normal mood   Data Reviewed: CBC: Recent Labs  Lab 09/09/17 1516  09/09/17 1948 09/09/17 2329 09/10/17 0357  09/10/17 1822 09/11/17 0024 09/11/17 0646 09/11/17 1425 09/12/17 0400  WBC 5.2  --  4.6 5.3 5.6  --   --  8.8  --   --  7.1  NEUTROABS 3.8  --  3.3  --  4.2  --   --  7.4  --   --  5.6  HGB 8.5*   < > 9.1* 8.3* 7.2*   < > 10.2* 9.2* 8.7* 9.1* 8.0*  HCT 25.7*   < > 27.2* 24.9* 21.3*   < > 30.2* 27.9* 25.8* 27.7* 24.4*  MCV 90.5  --  90.1 89.9 89.5  --   --  90.6  --   --  91.4  PLT 211  --  181 166 187  --   --  182  --   --  221   < > = values in this interval not displayed.   Basic Metabolic Panel: Recent Labs  Lab 09/06/17 0318  09/09/17 0348 09/09/17 1516 09/09/17 1612 09/10/17 0357 09/10/17 1047 09/11/17 0024 09/12/17 0400  NA 139   < > 142 141 142 140 143  145 142  K 5.0   < > 3.3* 3.5 3.4* 3.2* 3.6 3.6 3.1*  CL 103   < > 106 107 101 107  --  114* 112*  CO2 26   < > 28 28  --  26  --  25 23  GLUCOSE 156*   < > 134* 151* 139* 139* 132* 135* 140*  BUN 18   < > 22 19 16 16   --  16 17  CREATININE 1.61*   < > 1.24 1.16 1.10 0.99  --  1.23 1.13  CALCIUM 8.9   < > 8.5* 8.0*  --  7.5*  --  7.3* 7.4*  MG 2.0  --   --   --   --   --   --   --   --   PHOS 4.5  --   --   --   --   --   --   --   --    < > = values in this interval not displayed.   GFR: Estimated Creatinine Clearance: 64.4 mL/min (by C-G formula based on SCr of 1.13 mg/dL). Liver Function Tests: Recent Labs  Lab 09/06/17 0318  AST 15  ALT 11  ALKPHOS 53  BILITOT 0.7  PROT 6.7  ALBUMIN 3.4*   No results for input(s): LIPASE, AMYLASE in the last 168 hours. No results for input(s): AMMONIA in the last 168 hours. Coagulation Profile: Recent Labs  Lab 09/09/17 1609 09/10/17 0357  INR 1.27 1.41   Cardiac Enzymes: No results for input(s): CKTOTAL, CKMB, CKMBINDEX, TROPONINI in the last 168 hours. BNP (last 3 results) No results for input(s): PROBNP in the last 8760  hours. HbA1C: No results for input(s): HGBA1C in the last 72 hours. CBG: Recent Labs  Lab 09/11/17 1154 09/11/17 1558 09/11/17 2059 09/12/17 0824 09/12/17 1200  GLUCAP 171* 168* 144* 117* 113*   Lipid Profile: No results for input(s): CHOL, HDL, LDLCALC, TRIG, CHOLHDL, LDLDIRECT in the last 72 hours. Thyroid Function Tests: No results for input(s): TSH, T4TOTAL, FREET4, T3FREE, THYROIDAB in the last 72 hours. Anemia Panel: No results for input(s): VITAMINB12, FOLATE, FERRITIN, TIBC, IRON, RETICCTPCT in the last 72 hours. Urine analysis:    Component Value Date/Time   COLORURINE YELLOW 01/27/2016 1730   APPEARANCEUR CLOUDY (A) 01/27/2016 1730   LABSPEC 1.019 01/27/2016 1730   PHURINE 5.0 01/27/2016 1730   GLUCOSEU NEGATIVE 01/27/2016 1730   HGBUR NEGATIVE 01/27/2016 1730   BILIRUBINUR NEGATIVE 01/27/2016 1730   KETONESUR 5 (A) 01/27/2016 1730   PROTEINUR NEGATIVE 01/27/2016 1730   UROBILINOGEN 0.2 10/22/2014 2136   NITRITE NEGATIVE 01/27/2016 1730   LEUKOCYTESUR NEGATIVE 01/27/2016 1730   Sepsis Labs: @LABRCNTIP (procalcitonin:4,lacticidven:4)  ) Recent Results (from the past 240 hour(s))  Body fluid culture     Status: Abnormal   Collection Time: 09/03/17  3:54 PM  Result Value Ref Range Status   Specimen Description   Final    PLEURAL RIGHT Performed at [facility], [place] [address], [place], [place] [address]    Special Requests   Final    NONE Performed at [facility], [place] [address] [place], [place] [address]    Gram Stain   Final    WBC PRESENT,BOTH PMN AND MONONUCLEAR NO ORGANISMS SEEN CYTOSPIN SMEAR    Culture (A)  Final    FUNGUS (MOLD) ISOLATED, PROBABLE CONTAMINANT/COLONIZER (SAPROPHYTE). CONTACT MICROBIOLOGY IF FURTHER IDENTIFICATION REQUIRED [phone]. CRITICAL RESULT CALLED TO, READ BACK BY AND  VERIFIED WITH: [place], AT [phone] 09/06/17 BY D. Victoriano Lain REGARDING CULTURE GROWTH Performed at [facility], [place] [address], [place], [place] [address]    Report Status 09/07/2017 FINAL  Final  Surgical pcr screen     Status: None   Collection Time: 09/05/17  8:26 AM  Result Value Ref Range Status   MRSA, PCR NEGATIVE NEGATIVE Final   Staphylococcus aureus NEGATIVE NEGATIVE Final    Comment: (NOTE) The Xpert SA Assay (FDA approved for NASAL specimens in patients 56 years of age and older), is one component of a comprehensive surveillance program. It is not intended to diagnose infection nor to guide or monitor treatment. Performed at [facility], [place] [address] [place], [place] [address]   Culture, blood (routine x 2)     Status: None   Collection Time: 09/07/17  4:34 PM  Result Value Ref Range Status   Specimen Description   Final    BLOOD LEFT ANTECUBITAL Performed at [place] [address] [place], [place] [address]    Special Requests   Final    BOTTLES DRAWN AEROBIC AND ANAEROBIC Blood Culture adequate volume Performed at [place] [address], [place], [place] [address]    Culture   Final    NO GROWTH 5 DAYS Performed at [facility], [place] [address] [place], [place] [address]    Report Status 09/12/2017 FINAL  Final  Culture, blood (routine x 2)     Status: None   Collection Time: 09/07/17  4:40 PM  Result Value Ref Range Status   Specimen Description   Final    BLOOD BLOOD RIGHT HAND Performed at [place] [address] [place], [place] [address]    Special Requests   Final    BOTTLES DRAWN AEROBIC AND ANAEROBIC Blood Culture adequate volume Performed at [place] [address], [place], [place] [address]    Culture   Final    NO GROWTH 5 DAYS Performed at [facility], [place] [address] [place], [place] [address]    Report Status 09/12/2017 FINAL  Final      Studies: No results found.  Scheduled Meds: . acetaminophen  650  mg Oral Q6H  . buPROPion  150 mg Oral Daily  . citalopram  20 mg Oral Daily  . diltiazem  120 mg Oral Daily  . feeding supplement  237 mL Oral BID BM  . furosemide  80 mg Oral Daily  . gabapentin  200 mg Oral BID  . guaiFENesin  1,200 mg Oral BID  . insulin aspart  0-15 Units Subcutaneous TID WC  . insulin aspart  0-5 Units Subcutaneous QHS  . ipratropium  0.5 mg Nebulization BID  . iron polysaccharides  150 mg Oral BID  . levalbuterol  0.63 mg Nebulization BID  . mouth rinse  15 mL Mouth Rinse BID  . pantoprazole  40 mg Oral BID  . pravastatin  20 mg Oral q1800    Continuous Infusions: . sodium chloride 50 mL/hr at 09/12/17 1104  . sodium chloride    . methocarbamol (ROBAXIN) IV       LOS: 13 days     Alma Friendly, MD [facility]   If 7PM-7AM, please contact night-coverage [email or web address] Password [email or web address] 09/12/2017, 1:40 PM

## 2017-09-12 NOTE — Progress Notes (Signed)
Nutrition Follow-up  DOCUMENTATION CODES:   Obesity unspecified  INTERVENTION:  - Continue Ensure Surgery BID. - Will d/c Boost Breeze per patient preference. - Continue to encourage PO intakes. - Continue diet advancement as medically feasible.   NUTRITION DIAGNOSIS:   Increased nutrient needs related to cancer and cancer related treatments as evidenced by estimated needs. -ongoing  GOAL:   Patient will meet greater than or equal to 90% of their needs -unmet/unable to meet  MONITOR:   PO intake, Supplement acceptance, Labs, Weight trends, I & O's, Skin  ASSESSMENT:   84 y.o. male with medical history significant for CAD, Afib not on anticoagulation due to history of recurrent GI bleed felt to be from small gastric AVMs causing iron deficiency anemia.  Weight relatively stable throughout admission. Patient POD #7 R colectomy and POD #2 resection of anastomosis d/t bleeding. Patient has mainly been on CLD or has been NPO since admission on 7/30. Diet advanced from CLD to FLD today at 8 AM. Patient states he did not have breakfast this AM. He reports that he has been drinking mostly soda and is happy about diet advancement and plans to cut back on soda intake. He denies overt abdominal pain, denies nausea, states that he does not necessarily feel hungry. Lunch ordered per patient preferences: chocolate pudding, grits with salt and extra butter, cream of tomato soup, and cream of potato soup.    Medications reviewed; 80 mg oral Lasix/day, sliding scale Novolog, 40 mEq oral KCl x1 dose today.  Labs reviewed; CBG: 117 mg/dL today, K: 3.1 mmol/L, Ca: 7.4 mg/dL. IVF: NS @ 50 mL/hr.      Diet Order:   Diet Order            Diet full liquid Room service appropriate? Yes; Fluid consistency: Thin  Diet effective now              EDUCATION NEEDS:   Education needs have been addressed  Skin:  Skin Assessment: Skin Integrity Issues: Skin Integrity Issues:: Stage II,  Incisions DTI: back and L heel Stage II: sacrum and R heel Incisions: abdomen (8/5 and 8/10)  Last BM:  8/12  Height:   Ht Readings from Last 1 Encounters:  08/31/17 6' (1.829 m)    Weight:   Wt Readings from Last 1 Encounters:  09/12/17 109.5 kg    Ideal Body Weight:  80.9 kg  BMI:  Body mass index is 32.74 kg/m.  Estimated Nutritional Needs:   Kcal:  2400-2600  Protein:  110-120g  Fluid:  2L/day     Jarome Matin, MS, RD, LDN, CNSC Inpatient Clinical Dietitian Pager # [phone] After hours/weekend pager # [phone]

## 2017-09-12 NOTE — Progress Notes (Signed)
Physical Therapy Treatment Patient Details Name: Joel [name] MRN: [id number] DOB: 14-Nov-1932 Today's Date: 09/12/2017    History of Present Illness 84 y.o. male is s/p laparoscopic R colectomy and repair of umbilical due to colon CA. On 8/10, back to OR for re-exploration and removal of anastomosis.  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 Surgical history of R hip cannulated pinning in 03/2017, R shoulder surgery in 2011/2012, and electrotherapy for depression in 1969.     PT Comments    Pt is continuing to progress however he still requires considerable assistance for mobility at this time. Per eval note, he declines SNF placement. At this time, he is not able to manage at home alone. Recommend CSW consult. If pt chooses to go home, he will likely require 24 hour supervision/assist. Will continue to follow.    Follow Up Recommendations  Home health PT;Supervision/Assistance - 24 hour vs SNF(depending on progress/if pt agreeable. If pt refuses SNF, will likely require 24 hour supervision/assist )     Equipment Recommendations  None recommended by PT    Recommendations for Other Services       Precautions / Restrictions Precautions Precautions: Fall Precaution Comments: recent ABD surgery; R knee instability per pt Restrictions Weight Bearing Restrictions: No    Mobility  Bed Mobility Overal bed mobility: Needs Assistance Bed Mobility: Supine to Sit     Supine to sit: Mod assist;HOB elevated     General bed mobility comments: Assist for trunk and to scoot to EOB. Increased time. Utilized bedpad for scooting.   Transfers Overall transfer level: Needs assistance Equipment used: Rolling walker (2 wheeled) Transfers: Sit to/from Stand Sit to Stand: Min assist;+2 safety/equipment;From elevated surface         General transfer comment: Assist to rise, stabilize, control descent. VCs safety, hand placement.    Ambulation/Gait Ambulation/Gait assistance: Min assist;+2 safety/equipment Gait Distance (Feet): 60 Feet Assistive device: Rolling walker (2 wheeled) Gait Pattern/deviations: Step-to pattern;Step-through pattern;Decreased stride length     General Gait Details: Assist to steady throughout distance. Followed with recliner for safety. Dyspnea 2/4-remained on [place] O2. Pt fatigues fairly easily.    Stairs             Wheelchair Mobility    Modified Rankin (Stroke Patients Only)       Balance Overall balance assessment: Needs assistance;History of Falls         Standing balance support: Bilateral upper extremity supported Standing balance-Leahy Scale: Poor                              Cognition Arousal/Alertness: Awake/alert Behavior During Therapy: WFL for tasks assessed/performed Overall Cognitive Status: Within Functional Limits for tasks assessed                                        Exercises      General Comments        Pertinent Vitals/Pain Pain Assessment: No/denies pain    Home Living                      Prior Function            PT Goals (current goals can now be found in the care plan section) Progress towards PT goals: Progressing toward  goals    Frequency    Min 3X/week      PT Plan Discharge plan needs to be updated    Co-evaluation              AM-PAC PT "6 Clicks" Daily Activity  Outcome Measure  Difficulty turning over in bed (including adjusting bedclothes, sheets and blankets)?: Unable Difficulty moving from lying on back to sitting on the side of the bed? : Unable Difficulty sitting down on and standing up from a chair with arms (e.g., wheelchair, bedside commode, etc,.)?: Unable Help needed moving to and from a bed to chair (including a wheelchair)?: A Little Help needed walking in hospital room?: A Little Help needed climbing 3-5 steps with a railing? : Total 6 Click  Score: 10    End of Session Equipment Utilized During Treatment: Gait belt;Oxygen Activity Tolerance: Patient limited by fatigue;Patient tolerated treatment well Patient left: in chair;with call bell/phone within reach;with family/visitor present   PT Visit Diagnosis: Unsteadiness on feet (R26.81);Other abnormalities of gait and mobility (R26.89);Muscle weakness (generalized) (M62.81);History of falling (Z91.81)     Time: 1032-1050 PT Time Calculation (min) (ACUTE ONLY): 18 min  Charges:  $Gait Training: 8-22 mins                        Joel [name], MPT Pager: [phone]

## 2017-09-12 NOTE — Progress Notes (Signed)
Assessment & Plan: POD#2/7 - status post right colectomy, status post resection of anastomosis for bleeding  Tolerating clear liquid diet, loose BM's  No sign of active bleeding this AM  Advance to full liquid diet  Continue to monitor Hgb  Per medical service.  Surgery will follow.        Earnstine Regal, MD, [facility]       Office: [phone]   Chief Complaint: Right colon cancer, anemia  Subjective: Patient in bed, talkative.  Wants to eat.  Having loose BM's.  Objective: Vital signs in last 24 hours: Temp:  [97.6 F (36.4 C)-97.8 F (36.6 C)] 97.8 F (36.6 C) (08/11 1600) Pulse Rate:  [60-106] 60 (08/12 0600) Resp:  [13-27] 19 (08/12 0600) BP: (105-136)/(48-75) 136/58 (08/12 0600) SpO2:  [90 %-100 %] 96 % (08/12 0600) Weight:  [109.5 kg] 109.5 kg (08/12 0500) Last BM Date: 09/11/17  Intake/Output from previous day: 08/11 0701 - 08/12 0700 In: 1637.1 [I.V.:1537.1] Out: 875 [Urine:875] Intake/Output this shift: No intake/output data recorded.  Physical Exam: HEENT - sclerae clear, mucous membranes moist Neck - soft Abdomen - soft, obese; BS present; dressing dry and intact Neuro - alert & oriented, no focal deficits  Lab Results:  Recent Labs    09/11/17 0024  09/11/17 1425 09/12/17 0400  WBC 8.8  --   --  7.1  HGB 9.2*   < > 9.1* 8.0*  HCT 27.9*   < > 27.7* 24.4*  PLT 182  --   --  221   < > = values in this interval not displayed.   BMET Recent Labs    09/11/17 0024 09/12/17 0400  NA 145 142  K 3.6 3.1*  CL 114* 112*  CO2 25 23  GLUCOSE 135* 140*  BUN 16 17  CREATININE 1.23 1.13  CALCIUM 7.3* 7.4*   PT/INR Recent Labs    09/09/17 1609 09/10/17 0357  LABPROT 15.8* 17.2*  INR 1.27 1.41   Comprehensive Metabolic Panel:    Component Value Date/Time   NA 142 09/12/2017 0400   NA 145 09/11/2017 0024   K 3.1 (L) 09/12/2017 0400   K 3.6 09/11/2017 0024   CL 112 (H) 09/12/2017 0400   CL 114 (H)  09/11/2017 0024   CO2 23 09/12/2017 0400   CO2 25 09/11/2017 0024   BUN 17 09/12/2017 0400   BUN 16 09/11/2017 0024   CREATININE 1.13 09/12/2017 0400   CREATININE 1.23 09/11/2017 0024   CREATININE 1.38 (H) 03/17/2017 1458   GLUCOSE 140 (H) 09/12/2017 0400   GLUCOSE 135 (H) 09/11/2017 0024   CALCIUM 7.4 (L) 09/12/2017 0400   CALCIUM 7.3 (L) 09/11/2017 0024   AST 15 09/06/2017 0318   AST 22 09/05/2017 0504   AST 15 03/17/2017 1458   ALT 11 09/06/2017 0318   ALT 10 09/05/2017 0504   ALT 12 03/17/2017 1458   ALKPHOS 53 09/06/2017 0318   ALKPHOS 57 09/05/2017 0504   BILITOT 0.7 09/06/2017 0318   BILITOT 0.6 09/05/2017 0504   BILITOT 0.4 03/17/2017 1458   PROT 6.7 09/06/2017 0318   PROT 6.7 09/05/2017 0504   ALBUMIN 3.4 (L) 09/06/2017 0318   ALBUMIN 3.6 09/05/2017 0504    Studies/Results: Dg Chest Port 1 View  Result Date: 09/11/2017 CLINICAL DATA:  Of care associated pneumonia EXAM: PORTABLE CHEST 1 VIEW COMPARISON:  Three days ago FINDINGS: Chronic cardiomegaly and vascular pedicle widening.  Stable mild asymmetric opacification at the left base where there is volume loss. Congested appearance of vessels. IMPRESSION: 1. Cardiomegaly and vascular congestion. 2. No focal opacity in the frontal projection. Electronically Signed   By: Monte Fantasia M.D.   On: 09/11/2017 07:12      Lunabelle Oatley M 09/12/2017  Patient ID: Gilman Buttner, male   DOB: 1932-03-12, 84 y.o.   MRN: [id number]

## 2017-09-12 NOTE — Care Management Note (Signed)
Case Management Note  Patient Details  Name: Joel [name] MRN: [id number] Date of Birth: 1932-06-08  Subjective/Objective:     Pod-7 from rt colon resection/pod 2 from reanastomosis from bld/advancing diet to full liquids/iv ns/ iv robaxin               Action/Plan:  Following for cm needs   Expected Discharge Date:  (unknown)               Expected Discharge Plan:  Home/Self Care  In-House Referral:     Discharge planning Services  CM Consult  Post Acute Care Choice:    Choice offered to:     DME Arranged:    DME Agency:     HH Arranged:    [place] Agency:     Status of Service:  In process, will continue to follow  If discussed at Long Length of Stay Meetings, dates discussed:    Additional Comments:  Leeroy Cha, RN 09/12/2017, 10:12 AM

## 2017-09-13 ENCOUNTER — Encounter: Payer: Self-pay | Admitting: *Deleted

## 2017-09-13 ENCOUNTER — Ambulatory Visit (INDEPENDENT_AMBULATORY_CARE_PROVIDER_SITE_OTHER): Payer: PPO | Admitting: Orthopaedic Surgery

## 2017-09-13 LAB — BPAM RBC
BLOOD PRODUCT EXPIRATION DATE: 201909082359
BLOOD PRODUCT EXPIRATION DATE: 201909082359
BLOOD PRODUCT EXPIRATION DATE: 201909102359
Blood Product Expiration Date: 201909052359
Blood Product Expiration Date: 201909082359
Blood Product Expiration Date: 201909082359
Blood Product Expiration Date: 201909082359
Blood Product Expiration Date: 201909082359
Blood Product Expiration Date: 201909082359
Blood Product Expiration Date: 201909082359
Blood Product Expiration Date: 201909082359
Blood Product Expiration Date: 201909082359
ISSUE DATE / TIME: 201908091339
ISSUE DATE / TIME: 201908091348
ISSUE DATE / TIME: 201908091653
ISSUE DATE / TIME: 201908091653
ISSUE DATE / TIME: 201908100841
ISSUE DATE / TIME: 201908091052
ISSUE DATE / TIME: 201908100540
ISSUE DATE / TIME: 201908100841
UNIT TYPE AND RH: 5100
UNIT TYPE AND RH: 5100
UNIT TYPE AND RH: 5100
UNIT TYPE AND RH: 5100
UNIT TYPE AND RH: 5100
UNIT TYPE AND RH: 5100
UNIT TYPE AND RH: 5100
UNIT TYPE AND RH: 5100
Unit Type and Rh: 5100
Unit Type and Rh: 5100
Unit Type and Rh: 5100
Unit Type and Rh: 5100

## 2017-09-13 LAB — TYPE AND SCREEN
ABO/RH(D): O POS
ABO/RH(D): O POS
Antibody Screen: NEGATIVE
Antibody Screen: NEGATIVE
Unit division: 0
Unit division: 0
Unit division: 0
Unit division: 0
Unit division: 0
Unit division: 0
Unit division: 0
Unit division: 0
Unit division: 0
Unit division: 0
Unit division: 0
Unit division: 0

## 2017-09-13 LAB — GLUCOSE, CAPILLARY
GLUCOSE-CAPILLARY: 99 mg/dL (ref 70–99)
GLUCOSE-CAPILLARY: 147 mg/dL — AB (ref 70–99)
Glucose-Capillary: 152 mg/dL — ABNORMAL HIGH (ref 70–99)
Glucose-Capillary: 102 mg/dL — ABNORMAL HIGH (ref 70–99)

## 2017-09-13 LAB — BASIC METABOLIC PANEL
Anion gap: 6 (ref 5–15)
BUN: 13 mg/dL (ref 8–23)
CHLORIDE: 111 mmol/L (ref 98–111)
CO2: 25 mmol/L (ref 22–32)
Calcium: 7.8 mg/dL — ABNORMAL LOW (ref 8.9–10.3)
Creatinine, Ser: 0.94 mg/dL (ref 0.61–1.24)
GFR calc non Af Amer: >60 mL/min (ref >60)
Glucose, Bld: 119 mg/dL — ABNORMAL HIGH (ref 70–99)
POTASSIUM: 3.2 mmol/L — AB (ref 3.5–5.1)
SODIUM: 142 mmol/L (ref 135–145)

## 2017-09-13 LAB — CBC WITH DIFFERENTIAL/PLATELET
BASOS PCT: 0 %
Basophils Absolute: 0 10*3/uL (ref 0.0–0.1)
Eosinophils Absolute: 0.4 10*3/uL (ref 0.0–0.7)
Eosinophils Relative: 7 %
HCT: 23.4 % — ABNORMAL LOW (ref 39.0–52.0)
Hemoglobin: 7.6 g/dL — ABNORMAL LOW (ref 13.0–17.0)
Lymphocytes Relative: 10 %
Lymphs Abs: 0.5 10*3/uL — ABNORMAL LOW (ref 0.7–4.0)
MCH: 29.9 pg (ref 26.0–34.0)
MCHC: 32.5 g/dL (ref 30.0–36.0)
MCV: 92.1 fL (ref 78.0–100.0)
Monocytes Absolute: 0.4 10*3/uL (ref 0.1–1.0)
Monocytes Relative: 8 %
NEUTROS PCT: 75 %
Neutro Abs: 3.7 10*3/uL (ref 1.7–7.7)
Platelets: 215 10*3/uL (ref 150–400)
RBC: 2.54 MIL/uL — AB (ref 4.22–5.81)
RDW: 16.2 % — ABNORMAL HIGH (ref 11.5–15.5)
WBC: 5 10*3/uL (ref 4.0–10.5)

## 2017-09-13 MED ORDER — POTASSIUM CHLORIDE CRYS ER 20 MEQ PO TBCR
40.0000 meq | EXTENDED_RELEASE_TABLET | Freq: Once | ORAL | Status: AC
  Administered 2017-09-13: 40 meq via ORAL
  Filled 2017-09-13: qty 2

## 2017-09-13 NOTE — Progress Notes (Signed)
PROGRESS NOTE  Joel [name] DGU:[id number] DOB: Jun 12, 1932 DOA: 08/30/2017 PCP: Wenda Low, MD  HPI/Recap of past 43 hours: 84 year old male with medical history significant for CAD, A. fib not on AC due to recurrent GI bleed from possible gastric AVMs, diabetes, hypertension, COPD, OSA, CKD, diastolic CHF presents to the ED complaining of generalized weakness, with abdominal bloating.  In the ED hemoglobin was noted to be 7, with FOBT positive.  GI consulted.  Patient admitted for further management.  Today, pt reported feeling much better. Reports still having some bloody loose BM. Reports passing gas, denies any worsening abdominal pain, although abdomen still distended. Denies fever/chills, cough, N/V. Pt refusing SNF placement, wants to go home with every possible assistance he can get.   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 Localized mild inflammation characterized by erosions and erythema was found in the duodenal bulb Colonoscopy revealed an ulcerated, likely malignant non-obstructing medium-sized mass in the cecum and a 6 mm polyp in the rectum that was sessile along with sigmoid colon diverticula CT abd/pelvis/chest showed: No evidence of metastatic disease on chest/abdomen/pelvis. Possible hepatic cirrhosis Pathology confirms adenocarcinoma Gen surg on board: S/P laparoscopic R colectomy and umbilical repair on 09/08/54 C/w  iron supplementation Continue PPI Monitor closely  Lower GI bleed with acute drop in hemoglobin/Post op bleed s/p ex lap with partial colectomy on 09/10/17 Hgb drop to 6.6, with several episodes of BRBPR starting 09/09/17 Gen surg on board: S/P ex-lap with partial colectomy on 09/10/17 S/P 6U of PRBC + 2U of FFP + Kcentra since this admission Held ppx lovenox for now Will not resume eliquis/NOACs due to significant bleeding. Will defer to cardiology follow up as an outpt to decide resumption Trend CBC  Hypokalemia Replace PRN  Acute metabolic encephalopathy Resolved  Unknown etiology, currently back to baseline No narcotics given ABG unremarkable CT head no acute intracranial abnormalities Continue supplemental O2, cpap Monitor in SDU  AKI on CKD Stage 3 Resolved Baseline Cr around 1.4 S/P Gentle hydration Daily BMP  HCAP Afebrile, no leukocytosis Urine strep pneumo and legionella negative BC X 2 NGTD CXR showing airspace consolidation in LLL with L pleural effusion S/P Vancomycin, and Aztreonam for 5 days total  Right Pleural Effusion s/p thoracentesis CT Scan showed Moderate Fluid Thoracentesis done, obtained 650 mL of gold fluid  C/w Diuresis, increased lasix to 80 mg po daily  Acute on Chronic Diastolic HF Improved ECHO with EF of 55% to 60%. The study isnot technically sufficient to allow evaluation of LV diastolic function. CXR initially showed Cardiomegaly and mild pulmonary edema, repeat showed pulmonary edema resolved Continue PO lasix 80 mg daily, monitor BMP closely Strict I's/O's, Daily Weights  Chronic respiratory failure/COPD on Home O2 C/w 2 liters of O2 via [place]  Continue Xopenex/Atrovent, mucinex Flutter valve, incentive spirometry  Persistent Atrial Fibrillation  S/P Multiple DCCV Rate controlled CHADS2VASc: 4 but not on AC due chronic GIB but now cardiology recommending  starting the patient on Eliquis 5 mg twice daily once felt that he is stable  from a surgical standpoint Will not resume eliquis/NOACs due to significant bleeding. 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 HbA1c was 6.0 Continue SSI, accuchecks Continue gabapentin Hold Metformin 1000 mg po BID and Glimepiride 1 mg po Daily  HTN C/w Diltiazem 120 mg po Daily Hold Losartan 25 mg po Daily  OSA Ordered CPAP  HLD/Dyslipidemia  C/w statin  Gout Hold home Allopurinol 100 mg po Daily   BPPV Hold home Meclizine 25 mg po TIDprn  Bipolar Affective Disorder C/w Citalopram 20 mg po Daily and Bupropion 150 mg po Daily  C/w Temazepam 15 mg po qHS     Code Status: Full  Family Communication: None at bedside  Disposition Plan: Recommended SNF, but pt declines, wants to go home (lives alone), will appreciate [facility] services, with every possible assistance he can get.     Consultants:  Gen surg  Cardiology  GI  Procedures:  R colectomy and umbilical repair   Ex lap, with partial colectomy  Antimicrobials:  None currently  DVT prophylaxis:  Held Lovenox due to GIB   Objective: Vitals:   09/13/17 1000 09/13/17 1200 09/13/17 1300 09/13/17 1400  BP: 129/61     Pulse: 79  (!) 30 73  Resp: (!) 25  (!) 25 19  Temp:  97.6 F (36.4 C)    TempSrc:  Oral    SpO2: 97%  (!) 81% (!) 86%  Weight:      Height:        Intake/Output Summary (Last 24 hours) at 09/13/2017 1607 Last data filed at 09/13/2017 0600 Gross per 24 hour  Intake 924.77 ml  Output 300 ml  Net 624.77 ml   Filed Weights   09/11/17 0332 09/12/17 0500 09/13/17 0500  Weight: 108.1 kg 109.5 kg 110.4 kg    Exam:   General:  NAD, chronically ill-appearing  Cardiovascular: S1, S2  present  Respiratory: Diminished breath sounds bilaterally  Abdomen: Soft, NT, dressing C/D/I, ++distended, +BS  Musculoskeletal: No pedal edema bilaterally  Skin: Normal  Psychiatry: Normal mood   Data Reviewed: CBC: Recent Labs  Lab 09/09/17 1948 09/09/17 2329 09/10/17 0357  09/11/17 0024 09/11/17 0646 09/11/17 1425 09/12/17 0400 09/13/17 0343  WBC 4.6 5.3 5.6  --  8.8  --   --  7.1 5.0  NEUTROABS 3.3  --  4.2  --  7.4  --   --  5.6 3.7  HGB 9.1* 8.3* 7.2*   < > 9.2* 8.7* 9.1* 8.0* 7.6*  HCT 27.2* 24.9* 21.3*   < > 27.9* 25.8* 27.7* 24.4* 23.4*  MCV 90.1 89.9 89.5  --  90.6  --   --  91.4 92.1  PLT 181 166 187  --  182  --   --  221 215   < > = values in this interval not displayed.   Basic Metabolic Panel: Recent Labs  Lab 09/09/17 1516 09/09/17 1612 09/10/17 0357 09/10/17 1047 09/11/17 0024 09/12/17 0400 09/13/17 0343  NA 141 142 140 143 145 142 142  K 3.5 3.4* 3.2* 3.6 3.6 3.1* 3.2*  CL 107 101 107  --  114* 112*  111  CO2 28  --  26  --  25 23 25   GLUCOSE 151* 139* 139* 132* 135* 140* 119*  BUN 19 16 16   --  16 17 13   CREATININE 1.16 1.10 0.99  --  1.23 1.13 0.94  CALCIUM 8.0*  --  7.5*  --  7.3* 7.4* 7.8*   GFR: Estimated Creatinine Clearance: 77.7 mL/min (by C-G formula based on SCr of 0.94 mg/dL). Liver Function Tests: No results for input(s): AST, ALT, ALKPHOS, BILITOT, PROT, ALBUMIN in the last 168 hours. No results for input(s): LIPASE, AMYLASE in the last 168 hours. No results for input(s): AMMONIA in the last 168 hours. Coagulation Profile: Recent Labs  Lab 09/09/17 1609 09/10/17 0357  INR 1.27 1.41   Cardiac Enzymes: No results for input(s): CKTOTAL, CKMB, CKMBINDEX, TROPONINI in the last 168 hours. BNP (last 3 results) No results for input(s): PROBNP in the last 8760 hours. HbA1C: No results for input(s): HGBA1C in the last 72 hours. CBG: Recent Labs  Lab 09/12/17 1200 09/12/17 1644 09/12/17 2223 09/13/17 0734 09/13/17 1149   GLUCAP 113* 125* 132* 99 147*   Lipid Profile: No results for input(s): CHOL, HDL, LDLCALC, TRIG, CHOLHDL, LDLDIRECT in the last 72 hours. Thyroid Function Tests: No results for input(s): TSH, T4TOTAL, FREET4, T3FREE, THYROIDAB in the last 72 hours. Anemia Panel: No results for input(s): VITAMINB12, FOLATE, FERRITIN, TIBC, IRON, RETICCTPCT in the last 72 hours. Urine analysis:    Component Value Date/Time   COLORURINE YELLOW 01/27/2016 1730   APPEARANCEUR CLOUDY (A) 01/27/2016 1730   LABSPEC 1.019 01/27/2016 1730   PHURINE 5.0 01/27/2016 1730   GLUCOSEU NEGATIVE 01/27/2016 1730   HGBUR NEGATIVE 01/27/2016 1730   BILIRUBINUR NEGATIVE 01/27/2016 1730   KETONESUR 5 (A) 01/27/2016 1730   PROTEINUR NEGATIVE 01/27/2016 1730   UROBILINOGEN 0.2 10/22/2014 2136   NITRITE NEGATIVE 01/27/2016 1730   LEUKOCYTESUR NEGATIVE 01/27/2016 1730   Sepsis Labs: @LABRCNTIP (procalcitonin:4,lacticidven:4)  ) Recent Results (from the past 240 hour(s))  Surgical pcr screen     Status: None   Collection Time: 09/05/17  8:26 AM  Result Value Ref Range Status   MRSA, PCR NEGATIVE NEGATIVE Final   Staphylococcus aureus NEGATIVE NEGATIVE Final    Comment: (NOTE) The Xpert SA Assay (FDA approved for NASAL specimens in patients 59 years of age and older), is one component of a comprehensive surveillance program. It is not intended to diagnose infection nor to guide or monitor treatment. Performed at [facility], [place] [address], [place], [place] [address]   Culture, blood (routine x 2)     Status: None   Collection Time: 09/07/17  4:34 PM  Result Value Ref Range Status   Specimen Description   Final    BLOOD LEFT ANTECUBITAL Performed at [facility] [address] [place], [place] [address]    Special Requests   Final    BOTTLES DRAWN AEROBIC AND ANAEROBIC Blood Culture adequate volume Performed at [facility] [address]  [place], [place] [address]    Culture   Final    NO GROWTH 5 DAYS Performed at [facility], [place] [address], [place], [place] [address]    Report Status 09/12/2017 FINAL  Final  Culture, blood (routine x 2)     Status: None   Collection Time: 09/07/17  4:40 PM  Result Value Ref Range Status   Specimen Description   Final    BLOOD BLOOD RIGHT HAND Performed  at [facility], [place] [address] [place], [place] [address]    Special Requests   Final    BOTTLES DRAWN AEROBIC AND ANAEROBIC Blood Culture adequate volume Performed at [facility] [address], [place], [place] [address]    Culture   Final    NO GROWTH 5 DAYS Performed at [facility], [place] [address] [place], [place] [address]    Report Status 09/12/2017 FINAL  Final      Studies: No results found.  Scheduled Meds: . acetaminophen  650 mg Oral Q6H  . buPROPion  150 mg Oral Daily  . citalopram  20 mg Oral Daily  . diltiazem  120 mg Oral Daily  . feeding supplement  237 mL Oral BID BM  . furosemide  80 mg Oral Daily  . gabapentin  200 mg Oral BID  . guaiFENesin  1,200 mg Oral BID  . insulin aspart  0-15 Units Subcutaneous TID WC  . insulin aspart  0-5 Units Subcutaneous QHS  . ipratropium  0.5 mg Nebulization BID  . iron polysaccharides  150 mg Oral BID  . levalbuterol  0.63 mg Nebulization BID  . mouth rinse  15 mL Mouth Rinse BID  . pantoprazole  40 mg Oral BID  . pravastatin  20 mg Oral q1800    Continuous Infusions: . sodium chloride    . methocarbamol (ROBAXIN) IV       LOS: 14 days     Alma Friendly, MD [facility]   If 7PM-7AM, please contact night-coverage [email or web address] Password [facility] 09/13/2017, 4:07 PM

## 2017-09-13 NOTE — Consult Note (Signed)
   [facility] CM Inpatient Consult   09/13/2017  KENYADA HY Apr 03, 1932 [id number]   Received call from inpatient LCSW to request that writer speak with Mr. Hanner about [facility] Management engagement. Also indicated that Mr. Gauthier has declined SNF placement.   Went to bedside to speak with Mr. Fails about [facility] services. He is agreeable and [facility] Care Management written consent obtained and [facility] folder provided.   Mr. Strassman states he lives alone. Denies having any concerns with medications or with transportation.   Confirms Primary Care MD is Dr. Lysle Rubens ( PCP office listed as doing transition of care calls)  Confirmed best contact number for Mr. Brix as [phone].  Will make referral to [facility] for follow up. Mr. Samples has unplanned readmission risk score of 27% (high) and medical history of CAD, A.fib, CHF, COPD, CKD, HTN, OSA, DM, colon cancer.  Will make referral to [facility] for follow up.   Will make inpatient RNCM aware [facility] will follow.    Marthenia Rolling, MSN-Ed, RN,BSN [facility] Liaison [phone]

## 2017-09-13 NOTE — Care Management Note (Signed)
Case Management Note  Patient Details  Name: Joel [name] MRN: [id number] Date of Birth: 10-24-1932  Subjective/Objective:    Low hgb 7.6 receiving one unit prbc today/ iv ns iv robaxin  / rt hip fracture limiting movement post op/gi evaulation to look for another source of bleeding due to continued low hgb.              Action/Plan: following for cm needs remains in sdu.   Expected Discharge Date:  (unknown)               Expected Discharge Plan:  Home/Self Care  In-House Referral:     Discharge planning Services  CM Consult  Post Acute Care Choice:    Choice offered to:     DME Arranged:    DME Agency:     HH Arranged:    HH Agency:     Status of Service:  In process, will continue to follow  If discussed at Long Length of Stay Meetings, dates discussed:    Additional Comments:  Leeroy Cha, RN 09/13/2017, 10:28 AM

## 2017-09-13 NOTE — Progress Notes (Addendum)
3 Days Post-Op    SN:KNLZJ colon cancer, anemia  Subjective: He is still pretty distended, having some flatus and BM.  BM he says is liquid and still has some blood in it.   He had a femur fracture so Mobility is limited and needs PT/assistance for moving in bed and OOB.  Wound is OK,  Objective: Vital signs in last 24 hours: Temp:  [97.4 F (36.3 C)-98.7 F (37.1 C)] 97.4 F (36.3 C) (08/13 0400) Pulse Rate:  [71-134] 75 (08/13 0600) Resp:  [14-25] 20 (08/13 0600) BP: (120-158)/(51-121) 133/60 (08/13 0600) SpO2:  [84 %-100 %] 97 % (08/13 0600) Weight:  [110.4 kg] 110.4 kg (08/13 0500) Last BM Date: 09/12/17 240 PO 1733 IV Voided x 3 - 300 recorded Afebrile, VSS - AF H/H still dropping some -  transfused 10 units PRBC this admit No films Intake/Output from previous day: 08/12 0701 - 08/13 0700 In: 1973.9 [P.O.:240; I.V.:1733.9] Out: 300 [Urine:300] Intake/Output this shift: No intake/output data recorded.  General appearance: alert, cooperative and no distress Resp: clear to auscultation bilaterally and Moving 1250 on IS GI: very distended, + BS, +BM  - liquid and still some blood reported.    Lab Results:  Recent Labs    09/12/17 0400 09/13/17 0343  WBC 7.1 5.0  HGB 8.0* 7.6*  HCT 24.4* 23.4*  PLT 221 215    BMET Recent Labs    09/12/17 0400 09/13/17 0343  NA 142 142  K 3.1* 3.2*  CL 112* 111  CO2 23 25  GLUCOSE 140* 119*  BUN 17 13  CREATININE 1.13 0.94  CALCIUM 7.4* 7.8*   PT/INR No results for input(s): LABPROT, INR in the last 72 hours.  No results for input(s): AST, ALT, ALKPHOS, BILITOT, PROT, ALBUMIN in the last 168 hours.   Lipase  No results found for: LIPASE   Medications: . acetaminophen  650 mg Oral Q6H  . buPROPion  150 mg Oral Daily  . citalopram  20 mg Oral Daily  . diltiazem  120 mg Oral Daily  . feeding supplement  237 mL Oral BID BM  . furosemide  80 mg Oral Daily  . gabapentin  200 mg Oral BID  . guaiFENesin  1,200 mg  Oral BID  . insulin aspart  0-15 Units Subcutaneous TID WC  . insulin aspart  0-5 Units Subcutaneous QHS  . ipratropium  0.5 mg Nebulization BID  . iron polysaccharides  150 mg Oral BID  . levalbuterol  0.63 mg Nebulization BID  . mouth rinse  15 mL Mouth Rinse BID  . pantoprazole  40 mg Oral BID  . potassium chloride  40 mEq Oral Once  . pravastatin  20 mg Oral q1800   . sodium chloride    . methocarbamol (ROBAXIN) IV     Anti-infectives (From admission, onward)   Start     Dose/Rate Route Frequency Ordered Stop   09/10/17 1200  vancomycin (VANCOCIN) 1,500 mg in sodium chloride 0.9 % 500 mL IVPB  Status:  Discontinued     1,500 mg 250 mL/hr over 120 Minutes Intravenous Every 24 hours 09/10/17 0905 09/11/17 1102   09/10/17 0745  ciprofloxacin (CIPRO) IVPB 400 mg  Status:  Discontinued     400 mg 200 mL/hr over 60 Minutes Intravenous  Once 09/10/17 0733 09/10/17 0735   09/10/17 0745  metroNIDAZOLE (FLAGYL) IVPB 500 mg  Status:  Discontinued     500 mg 100 mL/hr over 60 Minutes Intravenous  Once 09/10/17  7062 09/10/17 0735   09/09/17 1200  vancomycin (VANCOCIN) 1,250 mg in sodium chloride 0.9 % 250 mL IVPB  Status:  Discontinued     1,250 mg 166.7 mL/hr over 90 Minutes Intravenous Every 24 hours 09/09/17 0904 09/10/17 0905   09/09/17 1100  vancomycin (VANCOCIN) IVPB 1000 mg/200 mL premix  Status:  Discontinued     1,000 mg 200 mL/hr over 60 Minutes Intravenous Every 24 hours 09/08/17 1050 09/09/17 0904   09/08/17 1200  vancomycin (VANCOCIN) 2,000 mg in sodium chloride 0.9 % 500 mL IVPB     2,000 mg 250 mL/hr over 120 Minutes Intravenous  Once 09/08/17 1050 09/08/17 1454   09/08/17 0100  aztreonam (AZACTAM) 2 g in sodium chloride 0.9 % 100 mL IVPB  Status:  Discontinued     2 g 200 mL/hr over 30 Minutes Intravenous Every 8 hours 09/07/17 1654 09/12/17 0718   09/07/17 1645  aztreonam (AZACTAM) 2 g in sodium chloride 0.9 % 100 mL IVPB     2 g 200 mL/hr over 30 Minutes Intravenous  STAT 09/07/17 1633 09/07/17 1853   09/05/17 1800  clindamycin (CLEOCIN) IVPB 900 mg     900 mg 100 mL/hr over 30 Minutes Intravenous Every 8 hours 09/05/17 1320 09/05/17 1826   09/05/17 0815  gentamicin (GARAMYCIN) 450 mg in dextrose 5 % 100 mL IVPB     5 mg/kg  90.1 kg (Adjusted) 111.3 mL/hr over 60 Minutes Intravenous To Short Stay 09/05/17 0749 09/05/17 1101   09/05/17 0748  clindamycin (CLEOCIN) IVPB 900 mg     900 mg 100 mL/hr over 30 Minutes Intravenous 60 min pre-op 09/05/17 0749 09/05/17 1006   08/31/17 1100  fluconazole (DIFLUCAN) tablet 100 mg     100 mg Oral Daily 08/31/17 0921 09/02/17 1726      Assessment/Plan Atrial fibrillation  Aortic stenosis - Moderate Hypertension OSA - BiPAP Anemia   Adenocarcinoma of the ascending colon  Laparoscopic assisted right colectomy, primary repair of umbilical hernia, 3/76/28, Dr. Fanny Skates POD# 8 Right colon cancer with bleeding - on Eliquis 09/10/17, Dr. Excell Seltzer   POD#3  FEN:IV fluids/Full liquids ID:  Bowel prep preop;  Aztreonam 8/7 - 8/12; Vancomycin 8/8 -8/12;  DVT:  No anticoagulation for ongoing low H/H Follow up:  Dr. Fanny Skates  Plan:  OOB more, try to walk more, but this is difficult, takes assistance to move.  Continue to watch H/H, ? Repeat GI evaluation to look for another bleeding source.       LOS: 14 days    Karuna Balducci 09/13/2017 [phone]

## 2017-09-13 NOTE — Consult Note (Signed)
[facility] Nurse wound follow-up consult note Reason for Consult: Reassessment of several pressure injuries Wound type: Sacrum stage 2 pressure injury; 2X1X.1cm, pink and moist Middle back stage 2 pressure injury; 16X1X.1cm, pink and moist with loose peeling skin surrounding previous deep tissue injury areas Left heel 2X2cm deep tissue injury Right heel stage 2 pink fluid filled blister; 3X3cm Pressure Injury POA: No Dressing procedure/placement/frequency: Float heels to reduce pressure.  Pt is on a low air loss bed to reduce pressure.  Foam dressing to protect from further injury and promote healing.  Discussed plan of care with patient. [place] team will continue to assess weekly. Julien Girt MSN, RN, [place], [place], [place]

## 2017-09-13 NOTE — Progress Notes (Signed)
Physical Therapy Treatment Patient Details Name: Joel [name] MRN: [id number] DOB: 03-24-1932 Today's Date: 09/13/2017    History of Present Illness 84 y.o. male is s/p laparoscopic R colectomy and repair of umbilical due to colon CA. On 8/10, back to OR for re-exploration and removal of anastomosis.  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 Surgical history of R hip cannulated pinning in 03/2017, R shoulder surgery in 2011/2012, and electrotherapy for depression in 1969.     PT Comments    Pt OOB in recliner.  Feeling better.  No pain from recent surgery.  Assisted with amb a greater distance.  Assisted to bathroom.  Pt had a lot flutes and a small rasberry bloodish stool.  Reported to RN.    Follow Up Recommendations  Home health PT;Supervision/Assistance - 24 hour;SNF(pt ref SNF)     Equipment Recommendations  None recommended by PT    Recommendations for Other Services       Precautions / Restrictions Precautions Precautions: Fall Precaution Comments: recent ABD surgery; R knee "bad" Restrictions Weight Bearing Restrictions: No    Mobility  Bed Mobility               General bed mobility comments: OOB in recliner upon arrival   Transfers Overall transfer level: Needs assistance Equipment used: Rolling walker (2 wheeled) Transfers: Sit to/from Stand Sit to Stand: Min guard;Supervision Stand pivot transfers: Min guard;Supervision       General transfer comment: good use of hands to steady self and one VC safety with turns  Ambulation/Gait Ambulation/Gait assistance: Supervision;Min guard Gait Distance (Feet): 120 Feet Assistive device: Rolling walker (2 wheeled) Gait Pattern/deviations: Step-to pattern;Step-through pattern;Decreased stride length     General Gait Details: tolerated an increased distance   Chief Strategy Officer    Modified Rankin (Stroke Patients Only)        Balance                                            Cognition Arousal/Alertness: Awake/alert Behavior During Therapy: WFL for tasks assessed/performed Overall Cognitive Status: Within Functional Limits for tasks assessed                                 General Comments: pleasant/motivated      Exercises      General Comments        Pertinent Vitals/Pain Pain Assessment: No/denies pain Pain Location: ABD feels "tight" Pain Descriptors / Indicators: Tightness Pain Intervention(s): Limited activity within patient's tolerance;Monitored during session;Repositioned    Home Living                      Prior Function            PT Goals (current goals can now be found in the care plan section) Progress towards PT goals: Progressing toward goals    Frequency    Min 3X/week      PT Plan Discharge plan needs to be updated    Co-evaluation              AM-PAC PT "6 Clicks" Daily Activity  Outcome Measure  Difficulty turning over in bed (including adjusting bedclothes,  sheets and blankets)?: A Little Difficulty moving from lying on back to sitting on the side of the bed? : A Little Difficulty sitting down on and standing up from a chair with arms (e.g., wheelchair, bedside commode, etc,.)?: A Little Help needed moving to and from a bed to chair (including a wheelchair)?: A Little Help needed walking in hospital room?: A Little Help needed climbing 3-5 steps with a railing? : A Lot 6 Click Score: 17    End of Session Equipment Utilized During Treatment: Gait belt Activity Tolerance: Patient tolerated treatment well Patient left: in chair;with call bell/phone within reach;with family/visitor present Nurse Communication: Mobility status PT Visit Diagnosis: Unsteadiness on feet (R26.81);Other abnormalities of gait and mobility (R26.89);Muscle weakness (generalized) (M62.81);History of falling (Z91.81)     Time:  1105-1130 PT Time Calculation (min) (ACUTE ONLY): 25 min  Charges:  $Gait Training: 8-22 mins $Therapeutic Activity: 8-22 mins                     Rica Koyanagi  PTA [facility]  Acute  Rehab Pager      [phone]

## 2017-09-13 NOTE — Progress Notes (Addendum)
CSW consult- SNF  CSW met with the patient at beside to discuss discharge planning.. Patient reports he does not want to go to SNF for rehab, and prefers to return home.  The patient reports he went to SNF in February after having surgery and stayed for a few weeks. Patient was then discharge home with Home Health services and completed rehab for about six weeks. Patient reports since then he has been going to the [facility] completing outpatient therapy.   Patient reports he prefers to be at home and is agreeable and open to any additional services.  Patient reports "it is easier to walk when I have my walker." Patient has a walker and wheelchair at home. Patient also reports his neighbors and friends have been very helpful with house duties, such as walking his dog. Patient can complete all ADL's and prepare his meals.   CSW contacted [place] liaison, she plans to follow up with the patient. RN case manager also aware of patient home needs.   D/C plan: [place], Marlinda Mike, MSW Clinical Social Worker  [phone] 09/13/2017  11:28 AM

## 2017-09-14 LAB — BASIC METABOLIC PANEL
Anion gap: 6 (ref 5–15)
BUN: 9 mg/dL (ref 8–23)
CHLORIDE: 109 mmol/L (ref 98–111)
CO2: 28 mmol/L (ref 22–32)
Calcium: 8 mg/dL — ABNORMAL LOW (ref 8.9–10.3)
Creatinine, Ser: 0.92 mg/dL (ref 0.61–1.24)
GFR calc Af Amer: >60 mL/min (ref >60)
GFR calc non Af Amer: >60 mL/min (ref >60)
GLUCOSE: 109 mg/dL — AB (ref 70–99)
POTASSIUM: 3.2 mmol/L — AB (ref 3.5–5.1)
Sodium: 143 mmol/L (ref 135–145)

## 2017-09-14 LAB — CBC WITH DIFFERENTIAL/PLATELET
Basophils Absolute: 0 10*3/uL (ref 0.0–0.1)
Basophils Relative: 1 %
EOS PCT: 7 %
Eosinophils Absolute: 0.3 10*3/uL (ref 0.0–0.7)
HCT: 24.9 % — ABNORMAL LOW (ref 39.0–52.0)
Hemoglobin: 8.1 g/dL — ABNORMAL LOW (ref 13.0–17.0)
LYMPHS ABS: 0.6 10*3/uL — AB (ref 0.7–4.0)
LYMPHS PCT: 14 %
MCH: 29.8 pg (ref 26.0–34.0)
MCHC: 32.5 g/dL (ref 30.0–36.0)
MCV: 91.5 fL (ref 78.0–100.0)
Monocytes Absolute: 0.3 10*3/uL (ref 0.1–1.0)
Monocytes Relative: 7 %
Neutro Abs: 3 10*3/uL (ref 1.7–7.7)
Neutrophils Relative %: 71 %
PLATELETS: 258 10*3/uL (ref 150–400)
RBC: 2.72 MIL/uL — ABNORMAL LOW (ref 4.22–5.81)
RDW: 16.4 % — ABNORMAL HIGH (ref 11.5–15.5)
WBC: 4.2 10*3/uL (ref 4.0–10.5)

## 2017-09-14 LAB — GLUCOSE, CAPILLARY
GLUCOSE-CAPILLARY: 95 mg/dL (ref 70–99)
GLUCOSE-CAPILLARY: 139 mg/dL — AB (ref 70–99)
GLUCOSE-CAPILLARY: 97 mg/dL (ref 70–99)
Glucose-Capillary: 107 mg/dL — ABNORMAL HIGH (ref 70–99)

## 2017-09-14 MED ORDER — CITALOPRAM HYDROBROMIDE 20 MG PO TABS
20.0000 mg | ORAL_TABLET | Freq: Every day | ORAL | Status: DC
  Administered 2017-09-14 – 2017-09-16 (×3): 20 mg via ORAL
  Filled 2017-09-14 (×3): qty 1

## 2017-09-14 MED ORDER — BUPROPION HCL ER (XL) 150 MG PO TB24
150.0000 mg | ORAL_TABLET | Freq: Every day | ORAL | Status: DC
  Administered 2017-09-14 – 2017-09-16 (×3): 150 mg via ORAL
  Filled 2017-09-14 (×3): qty 1

## 2017-09-14 MED ORDER — FUROSEMIDE 40 MG PO TABS
80.0000 mg | ORAL_TABLET | Freq: Every day | ORAL | Status: DC
  Administered 2017-09-14 – 2017-09-16 (×3): 80 mg via ORAL
  Filled 2017-09-14 (×3): qty 2

## 2017-09-14 MED ORDER — POTASSIUM CHLORIDE CRYS ER 20 MEQ PO TBCR
40.0000 meq | EXTENDED_RELEASE_TABLET | Freq: Once | ORAL | Status: AC
  Administered 2017-09-14: 40 meq via ORAL
  Filled 2017-09-14: qty 2

## 2017-09-14 MED ORDER — BENZOCAINE 10 % MT GEL
Freq: Two times a day (BID) | OROMUCOSAL | Status: DC | PRN
  Filled 2017-09-14: qty 9.4

## 2017-09-14 MED ORDER — B COMPLEX-C PO TABS
1.0000 | ORAL_TABLET | Freq: Every day | ORAL | Status: DC
  Administered 2017-09-14 – 2017-09-16 (×3): 1 via ORAL
  Filled 2017-09-14 (×3): qty 1

## 2017-09-14 MED ORDER — PHENOL 1.4 % MT LIQD
1.0000 | OROMUCOSAL | Status: DC | PRN
  Filled 2017-09-14: qty 177

## 2017-09-14 NOTE — Care Management Note (Signed)
Case Management Note  Patient Details  Name: Joel [name] MRN: [id number] Date of Birth: [birth year]  Subjective/Objective:                  Pod #4 resection of anastomosis after post surgical bleeding/cl ligs tolerating well/transfer to floor today per surgery.  Action/Plan: Following for cm needs and progression  Expected Discharge Date:  (unknown)               Expected Discharge Plan:  Home/Self Care  In-House Referral:     Discharge planning Services  CM Consult  Post Acute Care Choice:    Choice offered to:     DME Arranged:    DME Agency:     HH Arranged:    HH Agency:     Status of Service:  In process, will continue to follow  If discussed at Long Length of Stay Meetings, dates discussed:    Additional Comments:  Leeroy Cha, RN 09/14/2017, 8:57 AM

## 2017-09-14 NOTE — Consult Note (Signed)
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  He had bleeding after this and therefore had another surgery with resection of the anastomosis thinking that this may be the source of the bleeding.  After speaking to will the surgical PA it was felt that this would probably not the source of bleeding.  We were asked to see the patient and provide an opinion.  Looking back at his records in January 2018 he had an EGD which was reported as normal.  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  In February 2018 a repeat EGD was negative.  In March 2018 he had an EGD which showed an AVM in the stomach which was treated with argon plasma coagulation.  In April 2018 he had a negative enteroscopy.  A colonoscopy in August 2019 showed the colon cancer in the cecum and also sigmoid diverticulosis  Patient states he is no longer bleeding.  States it cleared up yesterday.  States he had a normal colored stool today.  Most recent hemoglobin and hematocrit were 8.1 and 24.9 respectively.     Past Medical History:  Diagnosis Date  . Allergic rhinitis   . Anemia   . Anxiety   . Aortic stenosis 2012   mild to mod   . Atrial fibrillation ([place]) 1991   with multiple DCCV  . Bipolar affective disorder ([name])   . Cancer of ascending colon ([place]) 09/05/2017  . CHF (congestive heart failure) ([place]) 08/30/2017  . COPD (chronic obstructive pulmonary disease) ([place])   . Diabetic neuropathy ([place])    in feet  . Diabetic neuropathy ([place])   . Dyslipidemia   . Fatigue    last year or so  . Gout   . Hypertension   . Insomnia   . Obesity   . On home oxygen therapy 2 1/2 liters with bipap at night  . OSA (obstructive sleep apnea)    severe, uses bipap 16 1/2 by 12 or 13 setting  . Prostate cancer ([place])   . Rectal bleeding 12/16/2015  . Type 2 diabetes mellitus ([place])   . Vertigo    Past Surgical History:   Procedure Laterality Date  . BIOPSY  09/02/2017   Procedure: BIOPSY;  Surgeon: Ronald Lobo, MD;  [facility]: [facility];  Service: Endoscopy;;  . CARDIOVERSION  yrs ago   prior to 1998  . COLON RESECTION N/A 09/05/2017   Procedure: LAPAROSCOPIC RIGHT COLON RESECTION;  Surgeon: Fanny Skates, MD;  Location: [facility];  Service: General;  Laterality: N/A;  . COLONOSCOPY N/A 09/02/2017   Procedure: COLONOSCOPY;  Surgeon: Ronald Lobo, MD;  [facility];  Service: Endoscopy;  Laterality: N/A;  . COLONOSCOPY WITH PROPOFOL N/A 01/23/2013   Procedure: COLONOSCOPY WITH PROPOFOL;  Surgeon: Garlan Fair, MD;  [facility];  Service: Endoscopy;  Laterality: N/A;  . electro shock  1969   for depression  . ENTEROSCOPY Left 05/17/2016   Procedure: ENTEROSCOPY;  Surgeon: Wilford Corner, MD;  [facility];  Service: Endoscopy;  Laterality: Left;  . ESOPHAGOGASTRODUODENOSCOPY N/A 03/03/2016   Procedure: ESOPHAGOGASTRODUODENOSCOPY (EGD);  Surgeon: Teena Irani, MD;  Location: [facility];  Service: Endoscopy;  Laterality: N/A;  . ESOPHAGOGASTRODUODENOSCOPY N/A 03/26/2016   Procedure: ESOPHAGOGASTRODUODENOSCOPY (EGD);  Surgeon: Teena Irani, MD;  Location[facility] ENDOSCOPY;  Service: Endoscopy;  Laterality: N/A;  would like to use ultraslim scope  . ESOPHAGOGASTRODUODENOSCOPY N/A  08/31/2017   Procedure: ESOPHAGOGASTRODUODENOSCOPY (EGD);  Surgeon: Laurence Spates, MD;  Location: [facility];  Service: Endoscopy;  Laterality: N/A;  . ESOPHAGOGASTRODUODENOSCOPY (EGD) WITH PROPOFOL N/A 01/23/2013   Procedure: ESOPHAGOGASTRODUODENOSCOPY (EGD) WITH PROPOFOL;  Surgeon: Garlan Fair, MD;  [facility];  Service: Endoscopy;  Laterality: N/A;  . ESOPHAGOGASTRODUODENOSCOPY (EGD) WITH PROPOFOL N/A 04/13/2016   Procedure: ESOPHAGOGASTRODUODENOSCOPY (EGD) WITH PROPOFOL;  Surgeon: Otis Brace, MD;  Location: [facility];  Service: Gastroenterology;  Laterality: N/A;  . GIVENS  CAPSULE STUDY N/A 03/03/2016   Procedure: GIVENS CAPSULE STUDY;  Surgeon: Teena Irani, MD;  [facility];  Service: Endoscopy;  Laterality: N/A;  . HEMORRHOID SURGERY N/A 12/16/2015   Procedure: PROCTOSCOPY WITH CONTROL OF BLEEDING;  Surgeon: Fanny Skates, MD;  Location: [facility];  Service: General;  Laterality: N/A;  . HIP PINNING,CANNULATED Right 03/23/2017   Procedure: CANNULATED HIP PINNING;  Surgeon: Marybelle Killings, MD;  [facility];  Service: Orthopedics;  Laterality: Right;  . KNEE SURGERY  1970's   rt  . LAPAROTOMY N/A 09/10/2017   Procedure: EXPLORATORY LAPAROTOMY, PARTIAL COLECTOMY;  Surgeon: Excell Seltzer, MD;  [facility];  Service: General;  Laterality: N/A;  . [place]  . POLYPECTOMY  09/02/2017   Procedure: POLYPECTOMY;  Surgeon: Ronald Lobo, MD;  [facility];  Service: Endoscopy;;  . PROSTATE BIOPSY    . SHOULDER SURGERY  7-8 yrs ago   rt   Social History   Socioeconomic History  . Marital status: Widowed    Spouse name: Not on file  . Number of children: Not on file  . Years of education: Not on file  . Highest education level: Not on file  Occupational History  . Occupation: Event organiser  . Financial resource strain: Not on file  . Food insecurity:    Worry: Not on file    Inability: Not on file  . Transportation needs:    Medical: Not on file    Non-medical: Not on file  Tobacco Use  . Smoking status: Former Smoker    Packs/day: 2.00    Years: 25.00    Pack years: 50.00    Types: Cigarettes    Last attempt to quit: 07/08/1976    Years since quitting: 41.2  . Smokeless tobacco: Never Used  Substance and Sexual Activity  . Alcohol use: No    Comment: in AA since 1977, no alcohol since 1977  . Drug use: No    Types: Marijuana    Comment: marijuana use 37 years ago  . Sexual activity: Yes  Lifestyle  . Physical activity:    Days per week: Not on file    Minutes per session: Not on file  . Stress: Not on  file  Relationships  . Social connections:    Talks on phone: Not on file    Gets together: Not on file    Attends religious service: Not on file    Active member of club or organization: Not on file    Attends meetings of clubs or organizations: Not on file    Relationship status: Not on file  . Intimate partner violence:    Fear of current or ex partner: Not on file    Emotionally abused: Not on file    Physically abused: Not on file    Forced sexual activity: Not on file  Other Topics Concern  . Not on file  Social History Narrative  . Not on file  family history includes Tuberculosis in his maternal grandmother.  Current Facility-Administered Medications:  .  0.9 %  sodium chloride infusion, 10 mL/hr, Intravenous, Once, [place], Marland Kitchen, MD .  acetaminophen (TYLENOL) tablet 650 mg, 650 mg, Oral, Q6H, Romana Juniper A, MD, 650 mg at 09/14/17 1517 .  B-complex with vitamin C tablet 1 tablet, 1 tablet, Oral, Daily, Lavina Hamman, MD, 1 tablet at 09/14/17 1027 .  benzocaine (ORAJEL) 10 % mucosal gel, , Mouth/Throat, BID PRN, Lavina Hamman, MD .  buPROPion (WELLBUTRIN XL) 24 hr tablet 150 mg, 150 mg, Oral, Daily, Lavina Hamman, MD, 150 mg at 09/14/17 0851 .  citalopram (CELEXA) tablet 20 mg, 20 mg, Oral, Daily, Lavina Hamman, MD, 20 mg at 09/14/17 0851 .  diltiazem (CARDIZEM CD) 24 hr capsule 120 mg, 120 mg, Oral, Daily, Excell Seltzer, MD, 120 mg at 09/14/17 0844 .  feeding supplement (ENSURE SURGERY) liquid 237 mL, 237 mL, Oral, BID BM, Excell Seltzer, MD, 237 mL at 09/07/17 1500 .  fluticasone (FLONASE) 50 MCG/ACT nasal spray 2 spray, 2 spray, Each Nare, Daily PRN, Excell Seltzer, MD .  furosemide (LASIX) tablet 80 mg, 80 mg, Oral, Daily, Lavina Hamman, MD, 80 mg at 09/14/17 0851 .  gabapentin (NEURONTIN) capsule 200 mg, 200 mg, Oral, BID, Romana Juniper A, MD, 200 mg at 09/14/17 0844 .  guaiFENesin (MUCINEX) 12 hr tablet 1,200 mg, 1,200 mg, Oral, BID,  Excell Seltzer, MD, 1,200 mg at 09/14/17 0844 .  insulin aspart (novoLOG) injection 0-15 Units, 0-15 Units, Subcutaneous, TID WC, Excell Seltzer, MD, 2 Units at 09/14/17 1144 .  insulin aspart (novoLOG) injection 0-5 Units, 0-5 Units, Subcutaneous, QHS, [place], Benjamin, MD .  ipratropium (ATROVENT) nebulizer solution 0.5 mg, 0.5 mg, Nebulization, BID, Excell Seltzer, MD, 0.5 mg at 09/14/17 1610 .  iron polysaccharides (NIFEREX) capsule 150 mg, 150 mg, Oral, BID, Excell Seltzer, MD, 150 mg at 09/14/17 0850 .  levalbuterol (XOPENEX) nebulizer solution 0.63 mg, 0.63 mg, Nebulization, BID, Excell Seltzer, MD, 0.63 mg at 09/14/17 9604 .  MEDLINE mouth rinse, 15 mL, Mouth Rinse, BID, Excell Seltzer, MD, 15 mL at 09/13/17 0925 .  methocarbamol (ROBAXIN) 500 mg in dextrose 5 % 50 mL IVPB, 500 mg, Intravenous, Q6H PRN, Kae Heller, Chelsea A, MD .  ondansetron (ZOFRAN) tablet 4 mg, 4 mg, Oral, Q6H PRN **OR** ondansetron (ZOFRAN) injection 4 mg, 4 mg, Intravenous, Q6H PRN, Excell Seltzer, MD .  oxyCODONE (Oxy IR/ROXICODONE) immediate release tablet 5 mg, 5 mg, Oral, Q4H PRN, Romana Juniper A, MD, 5 mg at 09/10/17 1320 .  pantoprazole (PROTONIX) EC tablet 40 mg, 40 mg, Oral, BID, Excell Seltzer, MD, 40 mg at 09/14/17 0845 .  phenol (CHLORASEPTIC) mouth spray 1 spray, 1 spray, Mouth/Throat, PRN, Lavina Hamman, MD .  pravastatin (PRAVACHOL) tablet 20 mg, 20 mg, Oral, q1800, Excell Seltzer, MD, 20 mg at 09/13/17 1746 Allergies  Allergen Reactions  . Penicillins Anaphylaxis    Has patient had a PCN reaction causing immediate rash, facial/tongue/throat swelling, SOB or lightheadedness with hypotension: unknown Has patient had a PCN reaction causing severe rash involving mucus membranes or skin necrosis: unknown Has patient had a PCN reaction that required hospitalization: unknown Has patient had a PCN reaction occurring within the last 10 years: no If all of the above answers  are "NO", then may proceed with Cephalosporin use.   Marland Kitchen Antihistamines, Loratadine-Type Other (See Comments)    depression  . Other     Beta blockers-Novacaine  Caused  depression     Objective:     BP 130/68 (BP Location: Left Arm)   Pulse 69   Temp 97.7 F (36.5 C) (Oral)   Resp 18   Ht 6' (1.829 m)   Wt 110.4 kg   SpO2 99%   BMI 33.01 kg/m   No distress  Heart regular rhythm no murmurs  Lungs clear  Abdomen soft nontender  Laboratory No components found for: D1    Assessment:     Colon cancer status post resection of cecal tumor earlier this month followed by repeat operation with resection of anastomosis because of bleeding.  Patient appears to be stable at this time.  No signs of bleeding at this time.      Plan:     At this time I would recommend holding any anticoagulants.  Recommend observation.  Sources of potential bleeding could have been diverticular in origin or further AVMs.  Would follow clinically for now.  Watch hemoglobin and hematocrit trends.  Further investigation based on H&H trends. Lab Results  Component Value Date   HGB 8.1 (L) 09/14/2017   HGB 7.6 (L) 09/13/2017   HGB 8.0 (L) 09/12/2017   HGB 9.5 (L) 03/17/2017   HCT 24.9 (L) 09/14/2017   HCT 23.4 (L) 09/13/2017   HCT 24.4 (L) 09/12/2017   ALKPHOS 53 09/06/2017   ALKPHOS 57 09/05/2017   ALKPHOS 53 09/04/2017   AST 15 09/06/2017   AST 22 09/05/2017   AST 15 09/04/2017   AST 15 03/17/2017   ALT 11 09/06/2017   ALT 10 09/05/2017   ALT 10 09/04/2017   ALT 12 03/17/2017

## 2017-09-14 NOTE — Progress Notes (Signed)
Assessment & Plan: POD#4/9 - status post right colectomy, status post resection of anastomosis for bleeding             Tolerating full liquid diet, loose BM's with mucous             No sign of active bleeding this AM             Continue to monitor Hgb - up to 8.1 this AM without transfusion  Will ask [facility] to see patient today.  They have followed him long-term (Dr. Amedeo [name], Dr. Michail [name])  OK to transfer to floor.  If Hgb remains stable, probably home 2-3 days.  Per medical service.  Surgery will follow.        Joel Regal, MD, [facility]       Office: [phone]   Chief Complaint: Colon Ca  Subjective: Patient up in chair, feels much improved this AM.  No complaints.  Mucous stool without blood this AM.  Tolerating full liquid diet.  Objective: Vital signs in last 24 hours: Temp:  [97.5 F (36.4 C)-98.3 F (36.8 C)] 98.3 F (36.8 C) (08/14 0400) Pulse Rate:  [30-98] 98 (08/14 0400) Resp:  [16-29] 22 (08/14 0400) BP: (125-132)/(48-93) 132/82 (08/14 0400) SpO2:  [79 %-100 %] 97 % (08/14 0825) Last BM Date: 09/13/17  Intake/Output from previous day: 08/13 0701 - 08/14 0700 In: -  Out: 3710 [Urine:1650] Intake/Output this shift: No intake/output data recorded.  Physical Exam: HEENT - sclerae clear, mucous membranes moist Neck - soft Abdomen - midline dressing dry and intact Neuro - alert & oriented, no focal deficits  Lab Results:  Recent Labs    09/13/17 0343 09/14/17 0340  WBC 5.0 4.2  HGB 7.6* 8.1*  HCT 23.4* 24.9*  PLT 215 258   BMET Recent Labs    09/13/17 0343 09/14/17 0340  NA 142 143  K 3.2* 3.2*  CL 111 109  CO2 25 28  GLUCOSE 119* 109*  BUN 13 9  CREATININE 0.94 0.92  CALCIUM 7.8* 8.0*   PT/INR No results for input(s): LABPROT, INR in the last 72 hours. Comprehensive Metabolic Panel:    Component Value Date/Time   NA 143 09/14/2017 0340   NA 142 09/13/2017 0343   K 3.2 (L) 09/14/2017  0340   K 3.2 (L) 09/13/2017 0343   CL 109 09/14/2017 0340   CL 111 09/13/2017 0343   CO2 28 09/14/2017 0340   CO2 25 09/13/2017 0343   BUN 9 09/14/2017 0340   BUN 13 09/13/2017 0343   CREATININE 0.92 09/14/2017 0340   CREATININE 0.94 09/13/2017 0343   CREATININE 1.38 (H) 03/17/2017 1458   GLUCOSE 109 (H) 09/14/2017 0340   GLUCOSE 119 (H) 09/13/2017 0343   CALCIUM 8.0 (L) 09/14/2017 0340   CALCIUM 7.8 (L) 09/13/2017 0343   AST 15 09/06/2017 0318   AST 22 09/05/2017 0504   AST 15 03/17/2017 1458   ALT 11 09/06/2017 0318   ALT 10 09/05/2017 0504   ALT 12 03/17/2017 1458   ALKPHOS 53 09/06/2017 0318   ALKPHOS 57 09/05/2017 0504   BILITOT 0.7 09/06/2017 0318   BILITOT 0.6 09/05/2017 0504   BILITOT 0.4 03/17/2017 1458   PROT 6.7 09/06/2017 0318   PROT 6.7 09/05/2017 0504   ALBUMIN 3.4 (L) 09/06/2017 0318   ALBUMIN 3.6 09/05/2017 0504    Studies/Results: No results found.    Joel [name]  09/14/2017  Patient ID: Joel [name], male   DOB: 04/14/1932, 84 y.o.   MRN: [id number]

## 2017-09-14 NOTE — Progress Notes (Signed)
Physical Therapy Treatment Patient Details Name: Joel [name] MRN: [id number] DOB: Jul 02, 1932 Today's Date: 09/14/2017    History of Present Illness 84 y.o. male is s/p laparoscopic R colectomy and repair of umbilical due to colon CA. On 8/10, back to OR for re-exploration and removal of anastomosis.  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 Surgical history of R hip cannulated pinning in 03/2017, R shoulder surgery in 2011/2012, and electrotherapy for depression in 1969.     PT Comments    Pt in recliner with B LE down 2 buddies visiting.  Unable to apply his crocs. Increased edema more so than yesterday.  Reported to RN.  Assisted to bathroom and had a mod formed dark brown stool and voided.  Lots of flutes.  Still feels distended.  Assisted with amb in hallway.  Progressing slowly.    Follow Up Recommendations  Home health PT;Supervision/Assistance - 24 hour;SNF     Equipment Recommendations  None recommended by PT    Recommendations for Other Services       Precautions / Restrictions Precautions Precautions: Fall Precaution Comments: recent ABD surgery; R knee "bad" Restrictions Weight Bearing Restrictions: No    Mobility  Bed Mobility               General bed mobility comments: OOB in recliner upon arrival   Transfers Overall transfer level: Needs assistance Equipment used: Rolling walker (2 wheeled) Transfers: Sit to/from [employer] Transfers Sit to Stand: Min guard;Supervision Stand pivot transfers: Min guard;Supervision       General transfer comment: good use of hands to steady self and one VC safety with turns   also assisted on/off toilet  Ambulation/Gait Ambulation/Gait assistance: Supervision;Min guard Gait Distance (Feet): 115 Feet Assistive device: Rolling walker (2 wheeled) Gait Pattern/deviations: Step-to pattern;Step-through pattern;Decreased stride length Gait velocity:  decreased    General Gait Details: tolerated distance well mild c/o fatigue    Stairs             Wheelchair Mobility    Modified Rankin (Stroke Patients Only)       Balance                                            Cognition Arousal/Alertness: Awake/alert Behavior During Therapy: WFL for tasks assessed/performed Overall Cognitive Status: Within Functional Limits for tasks assessed                                 General Comments: pleasant/motivated/sweet       Exercises      General Comments        Pertinent Vitals/Pain Pain Assessment: No/denies pain    Home Living                      Prior Function            PT Goals (current goals can now be found in the care plan section) Progress towards PT goals: Progressing toward goals    Frequency    Min 3X/week      PT Plan      Co-evaluation              AM-PAC PT "6 Clicks" Daily Activity  Outcome Measure  Difficulty turning  over in bed (including adjusting bedclothes, sheets and blankets)?: A Little Difficulty moving from lying on back to sitting on the side of the bed? : A Little Difficulty sitting down on and standing up from a chair with arms (e.g., wheelchair, bedside commode, etc,.)?: A Little Help needed moving to and from a bed to chair (including a wheelchair)?: A Little Help needed walking in hospital room?: A Little Help needed climbing 3-5 steps with a railing? : A Little 6 Click Score: 18    End of Session Equipment Utilized During Treatment: Gait belt Activity Tolerance: Patient tolerated treatment well Patient left: in chair;with call bell/phone within reach;with family/visitor present Nurse Communication: Mobility status       Time: 1445-1510 PT Time Calculation (min) (ACUTE ONLY): 25 min  Charges:  $Gait Training: 8-22 mins $Therapeutic Activity: 8-22 mins                     {Montee Tallman  PTA [facility]  Acute   Rehab Pager      [phone]

## 2017-09-14 NOTE — Progress Notes (Signed)
Patient transferred from ICU. Agree with previous RN assessment. VSS tele applied. Denies any needs at this time will continue to monitor.

## 2017-09-14 NOTE — Progress Notes (Signed)
[facility] Progress Note  Patient: Joel [name] YIF:[id number]   PCP: Wenda Low, MD DOB: 05-07-1932   DOA: 08/30/2017   DOS: 09/14/2017   Date of Service: the patient was seen and examined on 09/14/2017  Subjective: Patient denies any acute complaint.  No nausea no vomiting.  Breathing is okay.  Passing gas.  No blood in the stool.  Tolerating diet.  No abdominal pain.  Brief hospital course: Pt. with PMH of CAD, A. fib on anticoagulation, HTN, COPD, OSA, chronic diastolic CHF, CKD stage III; admitted on 08/30/2017, presented with complaint of generalized weakness, was found to have symptomatic anemia.  Patient underwent upper GI endoscopy on 08/31/2017 followed by colonoscopy on 09/02/2017.  No active source of bleeding was identified.  Due to persistent bleeding as well as abdominal pain general surgery was consulted and patient was taken for laparoscopic right colectomy and umbilical hernia repair on 09/05/2017.  Biopsy performed on the colonoscopy was positive for adenocarcinoma.  Had postoperative GI bleed after initiation of Eliquis requiring multiple PRBC transfusion and was undertaken expiratory laparotomy and partial colectomy on 09/10/2017.  Postprocedure the patient is recovering well H&H has been stable.  Had some blood mixed stool for which general surgery is considering repeat GI evaluation. Currently further plan is monitor H&H and monitor advancement of diet.  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 Localized mild inflammation characterized by erosions and erythema was found in the duodenal bulb Colonoscopy revealed an ulcerated, likely malignant non-obstructing medium-sized mass in the cecum and a 6 mm polyp in the rectum that  was sessile along with sigmoid colon diverticula CT abd/pelvis/chest showed: No evidence of metastatic disease on chest/abdomen/pelvis. Possible hepatic cirrhosis Pathology confirms adenocarcinoma Gen surg on board: S/P laparoscopic R colectomy and umbilical repair on 08/03/07 C/w iron supplementation Continue PPI Monitor closely  Lower GI bleed with acute drop in hemoglobin/Post op bleed s/p ex lap with partial colectomy on 09/10/17 Hgb drop to 6.6, with several episodes of BRBPR starting 09/09/17 Gen surg on board: S/P ex-lap with partial colectomy on 09/10/17 S/P 6U of PRBC + 2U of FFP + Kcentra since this admission Held ppx lovenox for now Will not resume eliquis/NOACs due to significant bleeding. Will defer to cardiology follow up as an outpt to decide resumption Trend CBC  Hypokalemia Replace PRN  Acute metabolic encephalopathy Resolved  Unknown etiology, currently back to baseline No narcotics given ABG unremarkable CT head no acute intracranial abnormalities Continue supplemental O2, cpap Monitor in SDU  AKI onCKD Stage 3 Resolved Baseline Cr around 1.4 S/P Gentle hydration Daily BMP  HCAP Afebrile, no leukocytosis Urine strep pneumo and legionella negative BC X 2 NGTD CXR showing airspace consolidation in LLL with L pleural effusion S/P Vancomycin, and Aztreonam for 5 days total  Right Pleural Effusion s/p thoracentesis CT Scan showed Moderate Fluid Thoracentesis done, obtained 650 mL of gold fluid  C/w Diuresis, increased lasix to 80 mg po daily  Acute on Chronic Diastolic HF Improved ECHO with EF of 55% to 60%. The study isnot technically sufficient to allow evaluation of LV diastolic function. CXR initially showed Cardiomegaly and mild pulmonary edema, repeat showed pulmonary edema resolved Continue PO lasix 80 mg daily, monitor BMP closely Strict I's/O's, Daily Weights  Chronic respiratory failure/COPD on  Home O2 C/w 2 liters of O2 via [place]    Continue Xopenex/Atrovent, mucinex Flutter valve, incentive spirometry  Persistent Atrial Fibrillation  S/P Multiple DCCV Rate controlled CHADS2VASc: 4 but not on AC due chronic GIBbut now cardiology recommending starting the patient on Eliquis 5 mg twice daily once felt that he is stable from a surgical standpoint Will not resume eliquis/NOACs due to significant bleeding. 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 HbA1c was 6.0 Continue SSI, accuchecks Continue gabapentin Hold Metformin 1000 mg po BID and Glimepiride 1 mg po Daily  HTN C/w Diltiazem 120 mg po Daily Hold Losartan 25 mg po Daily  OSA Ordered CPAP  HLD/Dyslipidemia  C/w statin  Gout Hold home Allopurinol 100 mg po Daily   BPPV Hold home Meclizine 25 mg po TIDprn  Bipolar Affective Disorder C/w Citalopram 20 mg po Daily and Bupropion 150 mg po Daily  C/w Temazepam 15 mg po qHS  Diet: full liquid diet DVT Prophylaxis: mechanical compression device  Advance goals of care discussion: full code  Family Communication: no family was present at bedside, at the time of interview.   Disposition:  Discharge to home, refused SNF.  Consultants: gastroenterology  General surgery  Cardiology  Procedures: Right laparoscopic colectomy and umbilical hernia repair. Exploratory laparotomy and partial colectomy. EGD. Colonoscopy.  Antibiotics: Anti-infectives (From admission, onward)   Start     Dose/Rate Route Frequency Ordered Stop   09/10/17 1200  vancomycin (VANCOCIN) 1,500 mg in sodium chloride 0.9 % 500 mL IVPB  Status:  Discontinued     1,500 mg 250 mL/hr over 120 Minutes Intravenous Every 24 hours 09/10/17 0905  09/11/17 1102   09/10/17 0745  ciprofloxacin (CIPRO) IVPB 400 mg  Status:  Discontinued     400 mg 200 mL/hr over 60 Minutes Intravenous  Once 09/10/17 0733 09/10/17 0735   09/10/17 0745  metroNIDAZOLE (FLAGYL) IVPB 500 mg  Status:  Discontinued     500 mg 100 mL/hr over 60 Minutes Intravenous  Once 09/10/17 0733 09/10/17 0735   09/09/17 1200  vancomycin (VANCOCIN) 1,250 mg in sodium chloride 0.9 % 250 mL IVPB  Status:  Discontinued     1,250 mg 166.7 mL/hr over 90 Minutes Intravenous Every 24 hours 09/09/17 0904 09/10/17 0905   09/09/17 1100  vancomycin (VANCOCIN) IVPB 1000 mg/200 mL premix  Status:  Discontinued     1,000 mg 200 mL/hr over 60 Minutes Intravenous Every 24 hours 09/08/17 1050 09/09/17 0904   09/08/17 1200  vancomycin (VANCOCIN) 2,000 mg in sodium chloride 0.9 % 500 mL IVPB     2,000 mg 250 mL/hr over 120 Minutes Intravenous  Once 09/08/17 1050 09/08/17 1454   09/08/17 0100  aztreonam (AZACTAM) 2 g in sodium chloride 0.9 % 100 mL IVPB  Status:  Discontinued     2 g 200 mL/hr over 30 Minutes Intravenous Every 8 hours 09/07/17 1654 09/12/17 0718   09/07/17 1645  aztreonam (AZACTAM) 2 g in sodium chloride 0.9 % 100 mL IVPB     2 g 200 mL/hr over 30 Minutes Intravenous STAT 09/07/17 1633 09/07/17 1853   09/05/17 1800  clindamycin (CLEOCIN) IVPB 900 mg     900 mg 100 mL/hr over 30  Minutes Intravenous Every 8 hours 09/05/17 1320 09/05/17 1826   09/05/17 0815  gentamicin (GARAMYCIN) 450 mg in dextrose 5 % 100 mL IVPB     5 mg/kg  90.1 kg (Adjusted) 111.3 mL/hr over 60 Minutes Intravenous To Short Stay 09/05/17 0749 09/05/17 1101   09/05/17 0748  clindamycin (CLEOCIN) IVPB 900 mg     900 mg 100 mL/hr over 30 Minutes Intravenous 60 min pre-op 09/05/17 0749 09/05/17 1006   08/31/17 1100  fluconazole (DIFLUCAN) tablet 100 mg     100 mg Oral Daily 08/31/17 0921 09/02/17 1726       Objective: Physical Exam: Vitals:   09/14/17 0127 09/14/17 0400 09/14/17 0800 09/14/17 0825    BP:  132/82    Pulse: 65 98    Resp: 18 (!) 22    Temp:  98.3 F (36.8 C) 97.8 F (36.6 C)   TempSrc:  Axillary Oral   SpO2: 99% 99%  97%  Weight:      Height:        Intake/Output Summary (Last 24 hours) at 09/14/2017 0845 Last data filed at 09/13/2017 1748 Gross per 24 hour  Intake -  Output 1650 ml  Net -1650 ml   Filed Weights   09/11/17 0332 09/12/17 0500 09/13/17 0500  Weight: 108.1 kg 109.5 kg 110.4 kg   General: Alert, Awake and Oriented to Time, Place and Person. Appear in mild distress, affect appropriate Eyes: PERRL, Conjunctiva normal ENT: Oral Mucosa clear moist. Neck: no JVD, no Abnormal Mass Or lumps Cardiovascular: S1 and S2 Present, no Murmur, Peripheral Pulses Present Respiratory: normal respiratory effort, Bilateral Air entry equal and Decreased, no use of accessory muscle, Clear to Auscultation, no Crackles, no wheezes Abdomen: Bowel Sound present, Soft and mild tenderness, no hernia Skin: no redness, no Rash, no induration Extremities: bilateral  Pedal edema,  no calf tenderness Neurologic: Grossly no focal neuro deficit. Bilaterally Equal motor strength  Data Reviewed: CBC: Recent Labs  Lab 09/10/17 0357  09/11/17 0024 09/11/17 0646 09/11/17 1425 09/12/17 0400 09/13/17 0343 09/14/17 0340  WBC 5.6  --  8.8  --   --  7.1 5.0 4.2  NEUTROABS 4.2  --  7.4  --   --  5.6 3.7 3.0  HGB 7.2*   < > 9.2* 8.7* 9.1* 8.0* 7.6* 8.1*  HCT 21.3*   < > 27.9* 25.8* 27.7* 24.4* 23.4* 24.9*  MCV 89.5  --  90.6  --   --  91.4 92.1 91.5  PLT 187  --  182  --   --  221 215 258   < > = values in this interval not displayed.   Basic Metabolic Panel: Recent Labs  Lab 09/10/17 0357 09/10/17 1047 09/11/17 0024 09/12/17 0400 09/13/17 0343 09/14/17 0340  NA 140 143 145 142 142 143  K 3.2* 3.6 3.6 3.1* 3.2* 3.2*  CL 107  --  114* 112* 111 109  CO2 26  --  25 23 25 28   GLUCOSE 139* 132* 135* 140* 119* 109*  BUN 16  --  16 17 13 9   CREATININE 0.99  --  1.23 1.13  0.94 0.92  CALCIUM 7.5*  --  7.3* 7.4* 7.8* 8.0*    Liver Function Tests: No results for input(s): AST, ALT, ALKPHOS, BILITOT, PROT, ALBUMIN in the last 168 hours. No results for input(s): LIPASE, AMYLASE in the last 168 hours. No results for input(s): AMMONIA in the last 168 hours. Coagulation Profile: Recent Labs  Lab 09/09/17  1609 09/10/17 0357  INR 1.27 1.41   Cardiac Enzymes: No results for input(s): CKTOTAL, CKMB, CKMBINDEX, TROPONINI in the last 168 hours. BNP (last 3 results) No results for input(s): PROBNP in the last 8760 hours. CBG: Recent Labs  Lab 09/13/17 0734 09/13/17 1149 09/13/17 1612 09/13/17 2133 09/14/17 0810  GLUCAP 99 147* 152* 102* 95   Studies: No results found.  Scheduled Meds: . acetaminophen  650 mg Oral Q6H  . buPROPion  150 mg Oral Daily  . citalopram  20 mg Oral Daily  . diltiazem  120 mg Oral Daily  . feeding supplement  237 mL Oral BID BM  . furosemide  80 mg Oral Daily  . gabapentin  200 mg Oral BID  . guaiFENesin  1,200 mg Oral BID  . insulin aspart  0-15 Units Subcutaneous TID WC  . insulin aspart  0-5 Units Subcutaneous QHS  . ipratropium  0.5 mg Nebulization BID  . iron polysaccharides  150 mg Oral BID  . levalbuterol  0.63 mg Nebulization BID  . mouth rinse  15 mL Mouth Rinse BID  . pantoprazole  40 mg Oral BID  . pravastatin  20 mg Oral q1800   Continuous Infusions: . sodium chloride    . methocarbamol (ROBAXIN) IV     PRN Meds: fluticasone, methocarbamol (ROBAXIN) IV, ondansetron **OR** ondansetron (ZOFRAN) IV, oxyCODONE  Time spent: 35 minutes  Author: Berle Mull, MD [facility] Pager: [phone] 09/14/2017 8:45 AM  If 7PM-7AM, please contact night-coverage at [email or web address], password [email or web address]

## 2017-09-15 LAB — GLUCOSE, CAPILLARY
GLUCOSE-CAPILLARY: 90 mg/dL (ref 70–99)
Glucose-Capillary: 160 mg/dL — ABNORMAL HIGH (ref 70–99)
Glucose-Capillary: 119 mg/dL — ABNORMAL HIGH (ref 70–99)
Glucose-Capillary: 175 mg/dL — ABNORMAL HIGH (ref 70–99)

## 2017-09-15 LAB — BASIC METABOLIC PANEL
Anion gap: 9 (ref 5–15)
BUN: 9 mg/dL (ref 8–23)
CALCIUM: 8.2 mg/dL — AB (ref 8.9–10.3)
CHLORIDE: 104 mmol/L (ref 98–111)
CO2: 29 mmol/L (ref 22–32)
CREATININE: 0.94 mg/dL (ref 0.61–1.24)
GFR calc Af Amer: >60 mL/min (ref >60)
Glucose, Bld: 98 mg/dL (ref 70–99)
Potassium: 3.2 mmol/L — ABNORMAL LOW (ref 3.5–5.1)
SODIUM: 142 mmol/L (ref 135–145)

## 2017-09-15 LAB — CBC WITH DIFFERENTIAL/PLATELET
BASOS PCT: 0 %
Basophils Absolute: 0 10*3/uL (ref 0.0–0.1)
EOS ABS: 0.2 10*3/uL (ref 0.0–0.7)
EOS PCT: 5 %
HCT: 25.7 % — ABNORMAL LOW (ref 39.0–52.0)
Hemoglobin: 8.3 g/dL — ABNORMAL LOW (ref 13.0–17.0)
LYMPHS ABS: 0.6 10*3/uL — AB (ref 0.7–4.0)
Lymphocytes Relative: 13 %
MCH: 29.5 pg (ref 26.0–34.0)
MCHC: 32.3 g/dL (ref 30.0–36.0)
MCV: 91.5 fL (ref 78.0–100.0)
MONO ABS: 0.5 10*3/uL (ref 0.1–1.0)
MONOS PCT: 10 %
Neutro Abs: 3.2 10*3/uL (ref 1.7–7.7)
Neutrophils Relative %: 72 %
PLATELETS: 306 10*3/uL (ref 150–400)
RBC: 2.81 MIL/uL — ABNORMAL LOW (ref 4.22–5.81)
RDW: 16.1 % — AB (ref 11.5–15.5)
WBC: 4.6 10*3/uL (ref 4.0–10.5)

## 2017-09-15 NOTE — Progress Notes (Signed)
5 Days Post-Op    CC: Right colon cancer/anemia  Subjective: "I am hungry as a bear."  Patient says he feels much better not having much pain no further blood in his stool.  Objective: Vital signs in last 24 hours: Temp:  [97.5 F (36.4 C)-97.9 F (36.6 C)] 97.5 F (36.4 C) (08/15 0520) Pulse Rate:  [69-77] 77 (08/15 0520) Resp:  [16-18] 16 (08/15 0520) BP: (117-130)/(57-74) 125/74 (08/15 0520) SpO2:  [96 %-100 %] 96 % (08/15 0520) Weight:  [108.3 kg] 108.3 kg (08/15 0520) Last BM Date: 09/14/17 960 PO 2175 IV  Stool x 1 Afebrile, VSS K+ 3.2, Hemoglobin 8.3, hematocrit 25.7; trending up.  Platelets 306,000 No films.  Intake/Output from previous day: 08/14 0701 - 08/15 0700 In: 960 [P.O.:960] Out: 2175 [Urine:2175] Intake/Output this shift: No intake/output data recorded.  General appearance: alert, cooperative and no distress Resp: clear to auscultation bilaterally Cardio: Aortic murmur GI: Large still looks a bit distended, positive bowel sounds, positive BM midline incision looks fine.  Lab Results:  Recent Labs    09/14/17 0340 09/15/17 0457  WBC 4.2 4.6  HGB 8.1* 8.3*  HCT 24.9* 25.7*  PLT 258 306    BMET Recent Labs    09/14/17 0340 09/15/17 0457  NA 143 142  K 3.2* 3.2*  CL 109 104  CO2 28 29  GLUCOSE 109* 98  BUN 9 9  CREATININE 0.92 0.94  CALCIUM 8.0* 8.2*   PT/INR No results for input(s): LABPROT, INR in the last 72 hours.  No results for input(s): AST, ALT, ALKPHOS, BILITOT, PROT, ALBUMIN in the last 168 hours.   Lipase  No results found for: LIPASE   Medications: . acetaminophen  650 mg Oral Q6H  . B-complex with vitamin C  1 tablet Oral Daily  . buPROPion  150 mg Oral Daily  . citalopram  20 mg Oral Daily  . diltiazem  120 mg Oral Daily  . feeding supplement  237 mL Oral BID BM  . furosemide  80 mg Oral Daily  . gabapentin  200 mg Oral BID  . guaiFENesin  1,200 mg Oral BID  . insulin aspart  0-15 Units Subcutaneous TID WC   . insulin aspart  0-5 Units Subcutaneous QHS  . ipratropium  0.5 mg Nebulization BID  . iron polysaccharides  150 mg Oral BID  . levalbuterol  0.63 mg Nebulization BID  . mouth rinse  15 mL Mouth Rinse BID  . pantoprazole  40 mg Oral BID  . pravastatin  20 mg Oral q1800    Assessment/Plan  Atrial fibrillation  Aortic stenosis - Moderate Hypertension OSA - BiPAP Anemia  Hx of AVMs small bowel & stomach; Hx sigmoid diverticulosis  Adenocarcinoma of the ascending colon Laparoscopic assisted right colectomy,primary repair of umbilical hernia, 2/87/68, Dr. Fanny Skates POD# 10 Right colon cancer with bleeding - on Eliquis 09/10/17, Dr. Excell Seltzer   POD#5  FEN:IV fluids/Full liquids ID:  Bowel prep preop;  Aztreonam 8/7 - 8/12; Vancomycin 8/8 -8/12;  DVT:  No anticoagulation for ongoing low H/H Follow up:  Dr. Fanny Skates  Plan: Carb modified diet, continue to mobilize.  GI recommends holding on the anticoagulation for now.    LOS: 16 days    Anjeanette Petzold 09/15/2017 [phone]

## 2017-09-15 NOTE — Progress Notes (Signed)
No bleeding. Stool brown. Continue to observe and hold anticoagulation for now. See my note from yesterday

## 2017-09-15 NOTE — Progress Notes (Signed)
Physical Therapy Treatment Patient Details Name: Joel [name] MRN: [id number] DOB: 1932-12-06 Today's Date: 09/15/2017    History of Present Illness 84 y.o. male is s/p laparoscopic R colectomy and repair of umbilical due to colon CA. On 8/10, back to OR for re-exploration and removal of anastomosis.  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 Surgical history of R hip cannulated pinning in 03/2017, R shoulder surgery in 2011/2012, and electrotherapy for depression in 1969.     PT Comments    Pt feeling tired and c/o buttock pain "from sitting".  Foam cushion in room off to the side and not in chair.  Pt stated his "tail bone" hurts.  I cut the cushion 8x8 inches center back to decrease pressure to that area andinstructed on proper positioning.  Assisted with amb a greater distance in hallway.  Tolerated well.  Pt is on regular diet and hopes to D/C to home tomorrow.  Pt  Declines SNF.  Lives alone but has plans to get help from friends.    Follow Up Recommendations  Home health PT;Supervision/Assistance - 24 hour;SNF(pt wants to go home and will have assistance and currently has a life alert)     Equipment Recommendations  None recommended by PT    Recommendations for Other Services       Precautions / Restrictions Precautions Precautions: Fall Precaution Comments: recent ABD surgery; R knee "bad" Restrictions Weight Bearing Restrictions: No    Mobility  Bed Mobility               General bed mobility comments: OOB in recliner upon arrival   Transfers Overall transfer level: Needs assistance Equipment used: Rolling walker (2 wheeled)   Sit to Stand: Supervision Stand pivot transfers: Supervision       General transfer comment: good use of hands to steady self and one VC safety with turns  Good safety cognition  Ambulation/Gait Ambulation/Gait assistance: Supervision Gait Distance (Feet): 135 Feet Assistive  device: Rolling walker (2 wheeled) Gait Pattern/deviations: Step-to pattern;Step-through pattern;Decreased stride length Gait velocity: decreased    General Gait Details: tolerated distance well mild c/o fatigue but is aware of his limitations    Stairs             Wheelchair Mobility    Modified Rankin (Stroke Patients Only)       Balance                                            Cognition Arousal/Alertness: Awake/alert Behavior During Therapy: WFL for tasks assessed/performed Overall Cognitive Status: Within Functional Limits for tasks assessed                                 General Comments: pleasant/motivated/sweet       Exercises      General Comments        Pertinent Vitals/Pain Pain Assessment: Faces Faces Pain Scale: Hurts a little bit Pain Location: ABD feels "tight"  "bloated" Pain Descriptors / Indicators: Tightness Pain Intervention(s): Monitored during session    Home Living                      Prior Function            PT  Goals (current goals can now be found in the care plan section) Progress towards PT goals: Progressing toward goals    Frequency    Min 3X/week      PT Plan Current plan remains appropriate    Co-evaluation              AM-PAC PT "6 Clicks" Daily Activity  Outcome Measure  Difficulty turning over in bed (including adjusting bedclothes, sheets and blankets)?: A Little Difficulty moving from lying on back to sitting on the side of the bed? : A Little Difficulty sitting down on and standing up from a chair with arms (e.g., wheelchair, bedside commode, etc,.)?: A Little Help needed moving to and from a bed to chair (including a wheelchair)?: A Little Help needed walking in hospital room?: A Little Help needed climbing 3-5 steps with a railing? : A Little 6 Click Score: 18    End of Session Equipment Utilized During Treatment: Gait belt Activity Tolerance:  Patient tolerated treatment well Patient left: in chair;with call bell/phone within reach;with family/visitor present Nurse Communication: Mobility status PT Visit Diagnosis: Unsteadiness on feet (R26.81);Other abnormalities of gait and mobility (R26.89);Muscle weakness (generalized) (M62.81);History of falling (Z91.81)     Time: 8984-2103 PT Time Calculation (min) (ACUTE ONLY): 26 min  Charges:  $Gait Training: 8-22 mins $Therapeutic Activity: 8-22 mins                     Rica Koyanagi  PTA [facility]  Acute  Rehab Pager      [phone]

## 2017-09-15 NOTE — Progress Notes (Signed)
[facility] Progress Note  Patient: Joel [name] DDU:[id number]   PCP: Wenda Low, MD DOB: 16-Feb-1932   DOA: 08/30/2017   DOS: 09/15/2017   Date of Service: the patient was seen and examined on 09/15/2017  Subjective: Patient denies any acute complaint.  No further bleeding.  Brief hospital course: Pt. with PMH of CAD, A. fib on anticoagulation, HTN, COPD, OSA, chronic diastolic CHF, CKD stage III; admitted on 08/30/2017, presented with complaint of generalized weakness, was found to have symptomatic anemia.  Patient underwent upper GI endoscopy on 08/31/2017 followed by colonoscopy on 09/02/2017.  No active source of bleeding was identified.  Due to persistent bleeding as well as abdominal pain general surgery was consulted and patient was taken for laparoscopic right colectomy and umbilical hernia repair on 09/05/2017.  Biopsy performed on the colonoscopy was positive for adenocarcinoma.  Had postoperative GI bleed after initiation of Eliquis requiring multiple PRBC transfusion and was undertaken expiratory laparotomy and partial colectomy on 09/10/2017.  Postprocedure the patient is recovering well H&H has been stable.  Had some blood mixed stool for which general surgery is considering repeat GI evaluation. Currently further plan is monitor H&H and monitor advancement of diet.  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 Localized mild inflammation characterized by erosions and erythema was found in the duodenal bulb Colonoscopy revealed an ulcerated, likely malignant non-obstructing medium-sized mass in the cecum and a 6 mm polyp in the rectum that was sessile along with sigmoid colon diverticula CT abd/pelvis/chest showed: No evidence of  metastatic disease on chest/abdomen/pelvis. Possible hepatic cirrhosis Pathology confirms adenocarcinoma Gen surg on board: S/P laparoscopic R colectomy and umbilical repair on 03/11/29 C/w iron supplementation Continue PPI GI reconsulted, recommend close monitoring for now.  Will recheck H&H tomorrow.  Likely can go home if the H&H remained stable.  Lower GI bleed with acute drop in hemoglobin/Post op bleed s/p ex lap with partial colectomy on 09/10/17 Hgb drop to 6.6, with several episodes of BRBPR starting 09/09/17 Gen surg on board: S/P ex-lap with partial colectomy on 09/10/17 S/P 6U of PRBC + 2U of FFP + Kcentra since this admission Held ppx lovenox for now Will not resume eliquis/NOACs due to significant bleeding. Will defer to cardiology follow up as an outpt to decide resumption Trend CBC  Hypokalemia Replace PRN  Acute metabolic encephalopathy Resolved  Unknown etiology, currently back to baseline No narcotics given ABG unremarkable CT head no acute intracranial abnormalities Continue supplemental O2, cpap Monitor in SDU  AKI onCKD Stage 3 Resolved Baseline Cr around 1.4 S/P Gentle hydration Daily BMP  HCAP Afebrile, no leukocytosis Urine strep pneumo and legionella negative BC X 2 NGTD CXR showing airspace consolidation in LLL with L pleural effusion S/P Vancomycin, and Aztreonam for 5 days total  Right Pleural Effusion s/p thoracentesis CT Scan showed Moderate Fluid Thoracentesis done, obtained 650 mL of gold fluid  C/w Diuresis, increased lasix to 80 mg po daily  Acute on Chronic Diastolic HF Improved ECHO with EF of 55% to 60%. The study isnot technically sufficient to allow evaluation of LV diastolic function. CXR initially showed Cardiomegaly and mild pulmonary edema, repeat showed pulmonary edema resolved Continue PO lasix 80 mg daily, monitor BMP closely Strict I's/O's, Daily Weights  Chronic respiratory failure/COPD on Home  O2 C/w 2 liters  of O2 via [place]  Continue Xopenex/Atrovent, mucinex Flutter valve, incentive spirometry  Persistent Atrial Fibrillation  S/P Multiple DCCV Rate controlled CHADS2VASc: 4 but not on AC due chronic GIBbut now cardiology recommending starting the patient on Eliquis 5 mg twice daily once felt that he is stable from a surgical standpoint Will not resume eliquis/NOACs due to significant bleeding. 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 HbA1c was 6.0 Continue SSI, accuchecks Continue gabapentin Hold Metformin 1000 mg po BID and Glimepiride 1 mg po Daily  HTN C/w Diltiazem 120 mg po Daily Hold Losartan 25 mg po Daily  OSA Ordered CPAP  HLD/Dyslipidemia  C/w statin  Gout Hold home Allopurinol 100 mg po Daily   BPPV Hold home Meclizine 25 mg po TIDprn  Bipolar Affective Disorder C/w Citalopram 20 mg po Daily and Bupropion 150 mg po Daily  C/w Temazepam 15 mg po qHS  Diet: full liquid diet DVT Prophylaxis: mechanical compression device  Advance goals of care discussion: full code  Family Communication: no family was present at bedside, at the time of interview.   Disposition:  Discharge to home, refused SNF.  Consultants: gastroenterology  General surgery  Cardiology  Procedures: Right laparoscopic colectomy and umbilical hernia repair. Exploratory laparotomy and partial colectomy. EGD. Colonoscopy.  Antibiotics: Anti-infectives (From admission, onward)   Start     Dose/Rate Route Frequency Ordered Stop   09/10/17 1200  vancomycin (VANCOCIN) 1,500 mg in sodium chloride 0.9 % 500 mL IVPB  Status:  Discontinued     1,500 mg 250 mL/hr over 120 Minutes Intravenous Every 24 hours  09/10/17 0905 09/11/17 1102   09/10/17 0745  ciprofloxacin (CIPRO) IVPB 400 mg  Status:  Discontinued     400 mg 200 mL/hr over 60 Minutes Intravenous  Once 09/10/17 0733 09/10/17 0735   09/10/17 0745  metroNIDAZOLE (FLAGYL) IVPB 500 mg  Status:  Discontinued     500 mg 100 mL/hr over 60 Minutes Intravenous  Once 09/10/17 0733 09/10/17 0735   09/09/17 1200  vancomycin (VANCOCIN) 1,250 mg in sodium chloride 0.9 % 250 mL IVPB  Status:  Discontinued     1,250 mg 166.7 mL/hr over 90 Minutes Intravenous Every 24 hours 09/09/17 0904 09/10/17 0905   09/09/17 1100  vancomycin (VANCOCIN) IVPB 1000 mg/200 mL premix  Status:  Discontinued     1,000 mg 200 mL/hr over 60 Minutes Intravenous Every 24 hours 09/08/17 1050 09/09/17 0904   09/08/17 1200  vancomycin (VANCOCIN) 2,000 mg in sodium chloride 0.9 % 500 mL IVPB     2,000 mg 250 mL/hr over 120 Minutes Intravenous  Once 09/08/17 1050 09/08/17 1454   09/08/17 0100  aztreonam (AZACTAM) 2 g in sodium chloride 0.9 % 100 mL IVPB  Status:  Discontinued     2 g 200 mL/hr over 30 Minutes Intravenous Every 8 hours 09/07/17 1654 09/12/17 0718   09/07/17 1645  aztreonam (AZACTAM) 2 g in sodium chloride 0.9 % 100 mL IVPB     2 g 200 mL/hr over 30 Minutes Intravenous STAT 09/07/17 1633 09/07/17 1853   09/05/17 1800  clindamycin (CLEOCIN) IVPB 900 mg     900 mg 100 mL/hr over 30 Minutes Intravenous  Every 8 hours 09/05/17 1320 09/05/17 1826   09/05/17 0815  gentamicin (GARAMYCIN) 450 mg in dextrose 5 % 100 mL IVPB     5 mg/kg  90.1 kg (Adjusted) 111.3 mL/hr over 60 Minutes Intravenous To Short Stay 09/05/17 0749 09/05/17 1101   09/05/17 0748  clindamycin (CLEOCIN) IVPB 900 mg     900 mg 100 mL/hr over 30 Minutes Intravenous 60 min pre-op 09/05/17 0749 09/05/17 1006   08/31/17 1100  fluconazole (DIFLUCAN) tablet 100 mg     100 mg Oral Daily 08/31/17 0921 09/02/17 1726       Objective: Physical Exam: Vitals:   09/14/17 2000 09/14/17 2245 09/15/17 0520  09/15/17 0830  BP:  117/63 125/74   Pulse:  70 77   Resp:  18 16   Temp:  97.9 F (36.6 C) (!) 97.5 F (36.4 C)   TempSrc:  Oral    SpO2: 99% 99% 96% 99%  Weight:   108.3 kg   Height:        Intake/Output Summary (Last 24 hours) at 09/15/2017 1732 Last data filed at 09/15/2017 1728 Gross per 24 hour  Intake 1140 ml  Output 2100 ml  Net -960 ml   Filed Weights   09/12/17 0500 09/13/17 0500 09/15/17 0520  Weight: 109.5 kg 110.4 kg 108.3 kg   General: Alert, Awake and Oriented to Time, Place and Person. Appear in mild distress, affect appropriate Eyes: PERRL, Conjunctiva normal ENT: Oral Mucosa clear moist. Neck: no JVD, no Abnormal Mass Or lumps Cardiovascular: S1 and S2 Present, no Murmur, Peripheral Pulses Present Respiratory: normal respiratory effort, Bilateral Air entry equal and Decreased, no use of accessory muscle, Clear to Auscultation, no Crackles, no wheezes Abdomen: Bowel Sound present, Soft and mild tenderness, no hernia Skin: no redness, no Rash, no induration Extremities: bilateral  Pedal edema,  no calf tenderness Neurologic: Grossly no focal neuro deficit. Bilaterally Equal motor strength  Data Reviewed: CBC: Recent Labs  Lab 09/11/17 0024  09/11/17 1425 09/12/17 0400 09/13/17 0343 09/14/17 0340 09/15/17 0457  WBC 8.8  --   --  7.1 5.0 4.2 4.6  NEUTROABS 7.4  --   --  5.6 3.7 3.0 3.2  HGB 9.2*   < > 9.1* 8.0* 7.6* 8.1* 8.3*  HCT 27.9*   < > 27.7* 24.4* 23.4* 24.9* 25.7*  MCV 90.6  --   --  91.4 92.1 91.5 91.5  PLT 182  --   --  221 215 258 306   < > = values in this interval not displayed.   Basic Metabolic Panel: Recent Labs  Lab 09/11/17 0024 09/12/17 0400 09/13/17 0343 09/14/17 0340 09/15/17 0457  NA 145 142 142 143 142  K 3.6 3.1* 3.2* 3.2* 3.2*  CL 114* 112* 111 109 104  CO2 25 23 25 28 29   GLUCOSE 135* 140* 119* 109* 98  BUN 16 17 13 9 9   CREATININE 1.23 1.13 0.94 0.92 0.94  CALCIUM 7.3* 7.4* 7.8* 8.0* 8.2*    Liver Function  Tests: No results for input(s): AST, ALT, ALKPHOS, BILITOT, PROT, ALBUMIN in the last 168 hours. No results for input(s): LIPASE, AMYLASE in the last 168 hours. No results for input(s): AMMONIA in the last 168 hours. Coagulation Profile: Recent Labs  Lab 09/09/17 1609 09/10/17 0357  INR 1.27 1.41   Cardiac Enzymes: No results for input(s): CKTOTAL, CKMB, CKMBINDEX, TROPONINI in the last 168 hours. BNP (last 3 results) No results for input(s): PROBNP in the last 8760  hours. CBG: Recent Labs  Lab 09/14/17 1645 09/14/17 2153 09/15/17 0804 09/15/17 1157 09/15/17 1644  GLUCAP 97 107* 90 160* 119*   Studies: No results found.  Scheduled Meds: . acetaminophen  650 mg Oral Q6H  . B-complex with vitamin C  1 tablet Oral Daily  . buPROPion  150 mg Oral Daily  . citalopram  20 mg Oral Daily  . diltiazem  120 mg Oral Daily  . feeding supplement  237 mL Oral BID BM  . furosemide  80 mg Oral Daily  . gabapentin  200 mg Oral BID  . guaiFENesin  1,200 mg Oral BID  . insulin aspart  0-15 Units Subcutaneous TID WC  . insulin aspart  0-5 Units Subcutaneous QHS  . ipratropium  0.5 mg Nebulization BID  . iron polysaccharides  150 mg Oral BID  . levalbuterol  0.63 mg Nebulization BID  . mouth rinse  15 mL Mouth Rinse BID  . pantoprazole  40 mg Oral BID  . pravastatin  20 mg Oral q1800   Continuous Infusions: . sodium chloride    . methocarbamol (ROBAXIN) IV     PRN Meds: benzocaine, fluticasone, methocarbamol (ROBAXIN) IV, ondansetron **OR** ondansetron (ZOFRAN) IV, oxyCODONE, phenol  Time spent: 35 minutes  Author: Berle Mull, MD [facility] Pager: [phone] 09/15/2017 5:32 PM  If 7PM-7AM, please contact night-coverage at [email or web address], password [facility]

## 2017-09-15 NOTE — Care Management Note (Signed)
Case Management Note  Patient Details  Name: Joel [name] MRN: [id number] Date of Birth: December 05, 1932  Subjective/Objective: Admitted w/Colon Ca.s/p  R colectomy. From home alone, has rw. PT recc HHPT.Used [facility] in past-will use again-HHRN/HHPT ordered rep Jermaine aware. Has pcp(will make own pcp f/u appt),pharmacy,transp. [facility] following.                  Action/Plan:d/c home w/[facility]HC.   Expected Discharge Date:  (unknown)               Expected Discharge Plan:  [place]  In-House Referral:     Discharge planning Services  CM Consult  Post Acute Care Choice:  (has rw) Choice offered to:  Patient  DME Arranged:    DME Agency:     HH Arranged:  RN, PT [place] Agency:  [place]  Status of Service:  Completed, signed off  If discussed at [place] of Stay Meetings, dates discussed:    Additional Comments:  Dessa Phi, RN 09/15/2017, 11:07 AM

## 2017-09-15 NOTE — Progress Notes (Signed)
OT Cancellation Note  Patient Details Name: Joel [name] MRN: [id number] DOB: October 19, 1932   Cancelled Treatment:    Reason Eval/Treat Not Completed: Fatigue/lethargy limiting ability to participate. Pt up in chair and too fatiqued to participate in OT   He is willing to stay up for awhile. Will check back another day.  Evana Runnels 09/15/2017, 2:50 PM  Lesle Chris, OTR/L [phone] 09/15/2017

## 2017-09-16 ENCOUNTER — Encounter (HOSPITAL_COMMUNITY): Payer: Self-pay

## 2017-09-16 ENCOUNTER — Other Ambulatory Visit: Payer: Self-pay | Admitting: *Deleted

## 2017-09-16 DIAGNOSIS — I5033 Acute on chronic diastolic (congestive) heart failure: Secondary | ICD-10-CM

## 2017-09-16 LAB — HEMOGLOBIN AND HEMATOCRIT, BLOOD
HCT: 26.4 % — ABNORMAL LOW (ref 39.0–52.0)
Hemoglobin: 8.4 g/dL — ABNORMAL LOW (ref 13.0–17.0)

## 2017-09-16 LAB — CBC WITH DIFFERENTIAL/PLATELET
BASOS ABS: 0 10*3/uL (ref 0.0–0.1)
BASOS PCT: 0 %
EOS ABS: 0.2 10*3/uL (ref 0.0–0.7)
EOS PCT: 4 %
HCT: 24.9 % — ABNORMAL LOW (ref 39.0–52.0)
HEMOGLOBIN: 7.9 g/dL — AB (ref 13.0–17.0)
Lymphocytes Relative: 11 %
Lymphs Abs: 0.6 10*3/uL — ABNORMAL LOW (ref 0.7–4.0)
MCH: 29.3 pg (ref 26.0–34.0)
MCHC: 31.7 g/dL (ref 30.0–36.0)
MCV: 92.2 fL (ref 78.0–100.0)
Monocytes Absolute: 0.5 10*3/uL (ref 0.1–1.0)
Monocytes Relative: 10 %
NEUTROS PCT: 75 %
Neutro Abs: 4 10*3/uL (ref 1.7–7.7)
PLATELETS: 306 10*3/uL (ref 150–400)
RBC: 2.7 MIL/uL — AB (ref 4.22–5.81)
RDW: 16.2 % — ABNORMAL HIGH (ref 11.5–15.5)
WBC: 5.4 10*3/uL (ref 4.0–10.5)

## 2017-09-16 LAB — BASIC METABOLIC PANEL
Anion gap: 9 (ref 5–15)
BUN: 10 mg/dL (ref 8–23)
CHLORIDE: 104 mmol/L (ref 98–111)
CO2: 28 mmol/L (ref 22–32)
Calcium: 8.4 mg/dL — ABNORMAL LOW (ref 8.9–10.3)
Creatinine, Ser: 1.17 mg/dL (ref 0.61–1.24)
GFR calc non Af Amer: 56 mL/min — ABNORMAL LOW (ref >60)
Glucose, Bld: 148 mg/dL — ABNORMAL HIGH (ref 70–99)
POTASSIUM: 3.2 mmol/L — AB (ref 3.5–5.1)
SODIUM: 141 mmol/L (ref 135–145)

## 2017-09-16 LAB — GLUCOSE, CAPILLARY
Glucose-Capillary: 133 mg/dL — ABNORMAL HIGH (ref 70–99)
Glucose-Capillary: 164 mg/dL — ABNORMAL HIGH (ref 70–99)

## 2017-09-16 MED ORDER — FUROSEMIDE 80 MG PO TABS: 80.0000 mg | ORAL_TABLET | Freq: Every day | ORAL | 0 refills | Status: DC

## 2017-09-16 MED ORDER — B COMPLEX-C PO TABS: 1.0000 | ORAL_TABLET | Freq: Every day | ORAL | 0 refills | Status: DC

## 2017-09-16 MED ORDER — POTASSIUM CHLORIDE CRYS ER 20 MEQ PO TBCR
40.0000 meq | EXTENDED_RELEASE_TABLET | Freq: Once | ORAL | Status: AC
  Administered 2017-09-16: 40 meq via ORAL
  Filled 2017-09-16: qty 2

## 2017-09-16 MED ORDER — DILTIAZEM HCL ER COATED BEADS 120 MG PO CP24: 120.0000 mg | ORAL_CAPSULE | Freq: Every day | ORAL | 0 refills | Status: AC

## 2017-09-16 MED ORDER — GABAPENTIN 100 MG PO CAPS: 200.0000 mg | ORAL_CAPSULE | Freq: Two times a day (BID) | ORAL | 0 refills | Status: AC

## 2017-09-16 MED ORDER — CITALOPRAM HYDROBROMIDE 20 MG PO TABS: 20.0000 mg | ORAL_TABLET | Freq: Every day | ORAL | 0 refills | Status: AC

## 2017-09-16 MED ORDER — ENSURE SURGERY PO LIQD: 237.0000 mL | Freq: Two times a day (BID) | ORAL | 0 refills | Status: DC

## 2017-09-16 MED ORDER — GUAIFENESIN ER 600 MG PO TB12: 1200.0000 mg | ORAL_TABLET | Freq: Two times a day (BID) | ORAL | 0 refills | Status: DC

## 2017-09-16 MED ORDER — PANTOPRAZOLE SODIUM 40 MG PO TBEC: 40.0000 mg | DELAYED_RELEASE_TABLET | Freq: Two times a day (BID) | ORAL | 0 refills | Status: AC

## 2017-09-16 NOTE — Care Management Important Message (Signed)
Important Message  Patient Details  Name: LERAY GARVERICK MRN: [id number] Date of Birth: [birth year]   Medicare Important Message Given:  Yes    Kerin Salen 09/16/2017, 11:47 AMImportant Message  Patient Details  Name: KELTON BULTMAN MRN: [id number] Date of Birth: 1932/05/30   Medicare Important Message Given:  Yes    Kerin Salen 09/16/2017, 11:47 AM

## 2017-09-16 NOTE — Progress Notes (Signed)
     Assessment & Plan: POD#6/11 - status post right colectomy, status post resection of anastomosis for bleeding Tolerating diet, formed BM's No sign of active bleeding this AM Hgb - 7.9 this AM  OK for discharge home today from surgical standpoint.  HHN for wound care - may shower once daily with wound open, then replace dressing.  Follow up at [place] office to be arranged.        Earnstine Regal, MD, [facility]       Office: [phone]   Chief Complaint: Colon Ca  Subjective: Patient up in chair, doing well.  Looking forward to going home to see his dog.  Objective: Vital signs in last 24 hours: Temp:  [98.3 F (36.8 C)-98.9 F (37.2 C)] 98.9 F (37.2 C) (08/16 0600) Pulse Rate:  [66-67] 67 (08/16 0600) Resp:  [16-20] 20 (08/16 0600) BP: (125-149)/(62-65) 149/65 (08/16 0600) SpO2:  [97 %-99 %] 98 % (08/16 0600) Weight:  [107.4 kg] 107.4 kg (08/16 0718) Last BM Date: 09/15/17  Intake/Output from previous day: 08/15 0701 - 08/16 0700 In: 780 [P.O.:780] Out: 2075 [Urine:2075] Intake/Output this shift: Total I/O In: 360 [P.O.:360] Out: 425 [Urine:425]  Physical Exam: HEENT - sclerae clear, mucous membranes moist Neck - soft Abdomen - soft, protuberant; wound dry and intact with packing in place; BS present Neuro - alert & oriented, no focal deficits  Lab Results:  Recent Labs    09/15/17 0457 09/16/17 0541  WBC 4.6 5.4  HGB 8.3* 7.9*  HCT 25.7* 24.9*  PLT 306 306   BMET Recent Labs    09/15/17 0457 09/16/17 0541  NA 142 141  K 3.2* 3.2*  CL 104 104  CO2 29 28  GLUCOSE 98 148*  BUN 9 10  CREATININE 0.94 1.17  CALCIUM 8.2* 8.4*   PT/INR No results for input(s): LABPROT, INR in the last 72 hours. Comprehensive Metabolic Panel:    Component Value Date/Time   NA 141 09/16/2017 0541   NA 142 09/15/2017 0457   K 3.2 (L) 09/16/2017 0541   K 3.2 (L) 09/15/2017 0457   CL 104  09/16/2017 0541   CL 104 09/15/2017 0457   CO2 28 09/16/2017 0541   CO2 29 09/15/2017 0457   BUN 10 09/16/2017 0541   BUN 9 09/15/2017 0457   CREATININE 1.17 09/16/2017 0541   CREATININE 0.94 09/15/2017 0457   CREATININE 1.38 (H) 03/17/2017 1458   GLUCOSE 148 (H) 09/16/2017 0541   GLUCOSE 98 09/15/2017 0457   CALCIUM 8.4 (L) 09/16/2017 0541   CALCIUM 8.2 (L) 09/15/2017 0457   AST 15 09/06/2017 0318   AST 22 09/05/2017 0504   AST 15 03/17/2017 1458   ALT 11 09/06/2017 0318   ALT 10 09/05/2017 0504   ALT 12 03/17/2017 1458   ALKPHOS 53 09/06/2017 0318   ALKPHOS 57 09/05/2017 0504   BILITOT 0.7 09/06/2017 0318   BILITOT 0.6 09/05/2017 0504   BILITOT 0.4 03/17/2017 1458   PROT 6.7 09/06/2017 0318   PROT 6.7 09/05/2017 0504   ALBUMIN 3.4 (L) 09/06/2017 0318   ALBUMIN 3.6 09/05/2017 0504    Studies/Results: No results found.    Kynli Chou M 09/16/2017  Patient ID: Joel [name], male   DOB: 10-27-1932, 84 y.o.   MRN: [id number]

## 2017-09-19 ENCOUNTER — Other Ambulatory Visit: Payer: Self-pay | Admitting: *Deleted

## 2017-09-19 NOTE — Patient Outreach (Signed)
[facility]) Care Management  09/19/2017  PARNELL SPIELER Oct 08, 1932 [id number]   Referral received from hospital liaison as member was admitted to hospital 7/30-8/16 with new diagnosis of colon cancer/status post ex-lap.  Primary MD will perform transition of care assessment.  Per chart, he also has history of aortic stenosis, hypertension, atrial fibrillation, pulmonary hypertension, COPD, diabetes, and prostate cancer.  Call placed to member for telephone assessment, identity verified.  This care manager introduced self and purpose of call.  [facility] care management services explained.  He report he lives alone but has support of daughter and friend.  They help with household needs, transportation to MD appointments, and medication management.  Attempted to review medications, however daughter not present and he would like her to review.  He does state he is taking all as prescribed.    He has been contacted by [place] for home services.  Has appointments with primary MD and cardiology within the next week.  Is not aware of appointment with surgeon, provided with contact information and advised to call to follow up.  If no appointment scheduled, if not scheduled advised to do so.  Denies any urgent concerns, agrees to home visit and initial assessment within the next week.  [name], [place], MSN [place] [phone]

## 2017-09-20 DIAGNOSIS — E860 Dehydration: Secondary | ICD-10-CM | POA: Diagnosis not present

## 2017-09-20 DIAGNOSIS — R197 Diarrhea, unspecified: Secondary | ICD-10-CM | POA: Diagnosis not present

## 2017-09-20 DIAGNOSIS — R5383 Other fatigue: Secondary | ICD-10-CM | POA: Diagnosis not present

## 2017-09-20 NOTE — Discharge Summary (Signed)
[facility] Discharge Summary   Patient: Joel [name]   PCP: Wenda Low, MD DOB: 1932-05-16   Date of admission: 08/30/2017   Date of discharge: 09/16/2017     Discharge Diagnoses:  Principal Problem:   Cancer of ascending colon ([place]) Active Problems:   DM2 (diabetes mellitus, type 2) ([place])   Pulmonary hypertension ([place])   Hypertension   Chronic GI bleeding   Persistent atrial fibrillation (HCC)   Blood loss anemia   Dyspnea   S/P colon resection   Pressure injury of skin   Admitted From: home Disposition:  Home with home health, pt refused SNF  Recommendations for Outpatient Follow-up:  1. Please follow up with PCP and cardiology as well as gastroenterology as recommended  2. Avoid anticoagulation    Follow-up Information    Erlene Quan, PA-C Follow up.   Specialties:  Cardiology, Radiology Why:  Cardiology hospital follow up on Tuesday August 27th at 1:30. Please arrive 15 minutes early for checkin.  Contact information: [address] [place] [address] [phone]        Fanny Skates, MD. Call.   Specialty:  General Surgery Contact information: [address] [place] [address] [phone]        [facility] Follow up.   Specialty:  [place] Why:  [facility] nursing/physical therapy Contact information: [address] [place] [address] [phone]        Schedule an appointment as soon as possible for a visit with Wenda Low, MD.   Specialty:  Internal Medicine Why:  within 3-5 days of discharge Contact information: [address] [place] [address] [phone]          Diet recommendation: cardiac diet  Activity: The patient is advised to gradually reintroduce usual activities.  Discharge Condition: good  Code Status: full code  History of present illness: As per the H and P dictated on admission, "Joel [name] is a  84 y.o. male with medical history significant for CAD, Afib not on anticoagulation due to history of recurrent GI bleed felt to be from small gastric AVMs causing iron deficiency anemia. He required several transfusions last year, but has since been doing well. He has been following up with hematology but has not followed up there since March 2019 it seems. The plan was for him to follow up monthlly with transfusions to keep Hb above 9. He has been doing well, but in the last 2-3 weeks he noted some abdominal bloating as well as weaness. Yesterday he want for a bone scan and asked that his hemoglobin be checked, he was called to come to ER today due to Hb of 7. Denies nausea, abdominal pain, reflux, or BRBPR. He has very dark stools but he takes iron BID.  He has had some SOB with exertion, but no chest pain, dizziness or syncope.  ED Course: In the ER repeat lab confirmed Hb 7, two units of blood have been ordered and Hemoccult was positive for blood. Hospitalist was asked to admit, and a call has been placed to GI for inpatient consultation"  Hospital Course:  Patient underwent upper GI endoscopy on 08/31/2017 followed by colonoscopy on 09/02/2017.  No active source of bleeding was identified.  Due to persistent bleeding as well as abdominal pain general surgery was consulted and patient was taken for laparoscopic right colectomy and umbilical hernia repair on 09/05/2017.  Biopsy performed on the  colonoscopy was positive for adenocarcinoma.  Had postoperative GI bleed after initiation of Eliquis requiring multiple PRBC transfusion and was undertaken expiratory laparotomy and partial colectomy on 09/10/2017. Postprocedure the patient is recovering well H&H has been stable.   Summary of his active problems in the hospital is as following. 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 Localized mild inflammation characterized by erosions and erythema was found in the duodenal bulb Colonoscopy revealed an ulcerated, likely malignant non-obstructing medium-sized mass in the cecum and a 6 mm polyp in the rectum that was sessile along with sigmoid colon diverticula CT abd/pelvis/chest showed: No evidence of metastatic disease on chest/abdomen/pelvis. Possible hepatic cirrhosis Pathology confirms adenocarcinoma Gen surg on board: S/P laparoscopic R colectomy and umbilical repair on 03/10/01 C/w iron supplementation Continue PPI GI reconsulted, recommend conservative management   Lower GI bleed with acute drop in hemoglobin/Post op bleed s/p ex lap with partial colectomy on 09/10/17 Hgb drop to 6.6, with several episodes of BRBPR starting 09/09/17 Gen surg on board: S/P ex-lap with partial colectomy on 09/10/17 S/P 6U of PRBC + 2U of FFP + Kcentra since this admission Held ppx lovenox for now Will not resume eliquis/NOACs due to significant bleeding. Will defer to cardiology follow up as an outpt to decide resumption Trend CBC outpatient   Hypokalemia Replace PRN  Acute metabolic encephalopathy Resolved  Unknown etiology, currently back to baseline No narcotics given ABG unremarkable CT head no acute intracranial abnormalities Continuesupplemental O2, cpap Monitor in SDU  AKI onCKD Stage 3 Resolved Baseline Cr around 1.4 S/PIVF  HCAP Afebrile, no leukocytosis Urine strep pneumo and legionella negative BC X 2 NGTD CXR showing airspace consolidation in LLL with L pleural effusion S/P Vancomycin, and Aztreonam for 5 days total  Right Pleural Effusion s/p thoracentesis CT Scan showed Moderate Fluid Thoracentesis done, obtained 650 mL of gold fluid  C/w Diuresis, increased lasix to 80 mg po daily  Acute on Chronic Diastolic HF Improved ECHO with EF of  55% to 60%. The study isnot technically sufficient to allow evaluation of LV diastolic function. CXR initially showed Cardiomegaly and mild pulmonary edema, repeat showed pulmonary edema resolved Continue PO lasix 80 mg daily,   Chronic respiratory failure/COPD on Home O2 C/w 2 liters of O2 via [place]  Continue Xopenex/Atrovent, mucinex Flutter valve, incentive spirometry  Persistent Atrial Fibrillation  S/P Multiple DCCV Rate controlled CHADS2VASc: 4 but not on AC due chronic GIBbut now cardiology recommending starting the patient on Eliquis 5 mg twice daily once felt that he is stable from a surgical standpoint Will not resume eliquis/NOACs due to significant bleeding. 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  Patient was seen by physical therapy, who recommended SNF, pt refused and home health was arranged by Education officer, museum and case Freight forwarder. On the day of the discharge the patient's vitals were stable , and no other acute medical condition were reported by patient. the patient was felt safe to be discharge at home with home health.  Consultants: General surgery gastroenterology cardiology  Procedures:  Right laparoscopic  colectomy and umbilical hernia repair. Exploratory laparotomy and partial colectomy. EGD. Colonoscopy.  DISCHARGE MEDICATION: Allergies as of 09/16/2017      Reactions   Penicillins Anaphylaxis   Has patient had a PCN reaction causing immediate rash, facial/tongue/throat swelling, SOB or lightheadedness with hypotension: unknown Has patient had a PCN reaction causing severe rash involving mucus membranes or skin necrosis: unknown Has patient had a PCN reaction that required hospitalization: unknown Has patient had a PCN reaction occurring within the last 10 years: no If all of the above answers are "NO", then may proceed with Cephalosporin use.   Antihistamines, Loratadine-type Other (See Comments)   depression   Other    Beta blockers-Novacaine  Caused depression      Medication List    STOP taking these medications   dextromethorphan-guaiFENesin 30-600 MG 12hr tablet Commonly known as:  MUCINEX DM   diltiazem 120 MG 12 hr capsule Commonly known as:  CARDIZEM SR Replaced by:  diltiazem 120 MG 24 hr capsule   enoxaparin 30 MG/0.3ML injection Commonly known as:  LOVENOX   losartan 25 MG tablet Commonly known as:  COZAAR   omeprazole 20 MG capsule Commonly known as:  PRILOSEC Replaced by:  pantoprazole 40 MG tablet     TAKE these medications   allopurinol 100 MG tablet Commonly known as:  ZYLOPRIM Take 100 mg by mouth daily. Notes to patient:  Continue home regimen    B-complex with vitamin C tablet Take 1 tablet by mouth daily. Notes to patient:  Start tomorrow    buPROPion 150 MG 24 hr tablet Commonly known as:  WELLBUTRIN XL Take 1 tablet (150 mg total) by mouth daily. Notes to patient:  Start tomorrow   cholecalciferol 1000 units tablet Commonly known as:  VITAMIN D Take 1,000 Units by mouth daily. Notes to patient:  Continue home regimen    citalopram 20 MG tablet Commonly known as:  CELEXA Take 1 tablet (20 mg total) by mouth daily. What changed:      medication strength  how much to take Notes to patient:  Start tomorrow    diltiazem 120 MG 24 hr capsule Commonly known as:  CARDIZEM CD Take 1 capsule (120 mg total) by mouth daily. Replaces:  diltiazem 120 MG 12 hr capsule   feeding supplement Liqd Take 237 mLs by mouth 2 (two) times daily between meals.   fluticasone 50 MCG/ACT nasal spray Commonly known as:  FLONASE Place 2 sprays into both nostrils daily. What changed:    when to take this  reasons to take this   furosemide 80 MG tablet Commonly known as:  LASIX Take 1 tablet (80 mg total) by mouth daily. What changed:    medication strength  how much to take Notes to patient:  Start tomorrow    gabapentin 100 MG capsule Commonly known as:  NEURONTIN Take 2 capsules (200 mg total) by mouth 2 (two) times daily.  glimepiride 1 MG tablet Commonly known as:  AMARYL Take 1 mg by mouth daily with breakfast. Notes to patient:  Continue home regimen    guaiFENesin 600 MG 12 hr tablet Commonly known as:  MUCINEX Take 2 tablets (1,200 mg total) by mouth 2 (two) times daily.   iron polysaccharides 150 MG capsule Commonly known as:  NIFEREX Take 1 capsule (150 mg total) by mouth 2 (two) times daily.   lovastatin 20 MG tablet Commonly known as:  MEVACOR Take 20 mg by mouth at bedtime.   meclizine 25 MG tablet Commonly known as:  ANTIVERT Take 1 tablet (25 mg total) by mouth 3 (three) times daily as needed for dizziness. What changed:  when to take this   metFORMIN 1000 MG tablet Commonly known as:  GLUCOPHAGE Take 1,000 mg by mouth 2 (two) times daily. Notes to patient:  Continue home regimen    OXYGEN Inhale 2 L into the lungs as needed (shortness of breath).   pantoprazole 40 MG tablet Commonly known as:  PROTONIX Take 1 tablet (40 mg total) by mouth 2 (two) times daily. Replaces:  omeprazole 20 MG capsule   potassium chloride SA 20 MEQ tablet Commonly known as:  K-DUR,KLOR-CON Take 20 mEq by  mouth daily. Notes to patient:  Start tomorrow   senna-docusate 8.6-50 MG tablet Commonly known as:  Senokot-S Take 2 tablets by mouth 2 (two) times daily. Notes to patient:  Continue home regimen    temazepam 15 MG capsule Commonly known as:  RESTORIL Take 1 capsule (15 mg total) by mouth at bedtime.   VISION FORMULA PO Take 1 tablet by mouth 2 (two) times daily. Notes to patient:  Continue home regimen    VITAMIN B-12 PO Take 1 tablet by mouth daily. Notes to patient:  Continue home regimen    VITAMIN C PO Take 1 tablet by mouth daily. Notes to patient:  Start tomorrow            Discharge Care Instructions  (From admission, onward)         Start     Ordered   09/16/17 0000  Discharge wound care:    Comments:  may shower once daily with wound open, then replace dressing   09/16/17 1232         Allergies  Allergen Reactions  . Penicillins Anaphylaxis    Has patient had a PCN reaction causing immediate rash, facial/tongue/throat swelling, SOB or lightheadedness with hypotension: unknown Has patient had a PCN reaction causing severe rash involving mucus membranes or skin necrosis: unknown Has patient had a PCN reaction that required hospitalization: unknown Has patient had a PCN reaction occurring within the last 10 years: no If all of the above answers are "NO", then may proceed with Cephalosporin use.   Mar[place] Kitchen Antihistamines, Loratadine-Type Other (See Comments)    depression  . Other     Beta blockers-Novacaine  Caused depression   Discharge Instructions    Diet - low sodium heart healthy   Complete by:  As directed    Discharge instructions   Complete by:  As directed    It is important that you read following instructions as well as go over your medication list with RN to help you understand your care after this hospitalization.  Discharge Instructions: Please follow-up with PCP in one week  Please request your primary care physician to go over all  Hospital Tests and Procedure/Radiological results at the follow up,  Please get all [facility]  records sent to your PCP by signing hospital release before you go home.   Do not take more than prescribed Pain, Sleep and Anxiety Medications. You were cared for by a hospitalist during your hospital stay. If you have any questions about your discharge medications or the care you received while you were in the hospital after you are discharged, you can call the unit and ask to speak with the hospitalist on call if the hospitalist that took care of you is not available.  Once you are discharged, your primary care physician will handle any further medical issues. Please note that NO REFILLS for any discharge medications will be authorized once you are discharged, as it is imperative that you return to your primary care physician (or establish a relationship with a primary care physician if you do not have one) for your aftercare needs so that they can reassess your need for medications and monitor your lab values. You Must read complete instructions/literature along with all the possible adverse reactions/side effects for all the Medicines you take and that have been prescribed to you. Take any new Medicines after you have completely understood and accept all the possible adverse reactions/side effects. Wear Seat belts while driving. If you have smoked or chewed Tobacco in the last 2 yrs please stop smoking and/or stop any Recreational drug use.   Discharge wound care:   Complete by:  As directed    may shower once daily with wound open, then replace dressing   Increase activity slowly   Complete by:  As directed      Discharge Exam: Filed Weights   09/13/17 0500 09/15/17 0520 09/16/17 0718  Weight: 110.4 kg 108.3 kg 107.4 kg   Vitals:   09/16/17 0600 09/16/17 0831  BP: (!) 149/65   Pulse: 67   Resp: 20   Temp: 98.9 F (37.2 C)   SpO2: 98% 98%   General: Appear in no distress, no Rash; Oral  Mucosa moist. Cardiovascular: S1 and S2 Present, no Murmur, no JVD Respiratory: Bilateral Air entry present and Clear to Auscultation, no Crackles, no wheezes Abdomen: Bowel Sound present, Soft and no tenderness Extremities: no Pedal edema, no calf tenderness Neurology: Grossly no focal neuro deficit.no  The results of significant diagnostics from this hospitalization (including imaging, microbiology, ancillary and laboratory) are listed below for reference.    Significant Diagnostic Studies: Dg Chest 1 View  Result Date: 09/07/2017 CLINICAL DATA:  Pleural effusion EXAM: CHEST  1 VIEW COMPARISON:  September 04, 2017 FINDINGS: There is airspace consolidation in the left lower lobe with left pleural effusion. There is a minimal right pleural effusion. There is mild atelectasis in the medial right base. There is cardiomegaly with pulmonary vascularity within normal limits. There is aortic atherosclerosis. No adenopathy. Patient is status post median sternotomy. No evident bone lesions. IMPRESSION: New airspace consolidation left lower lobe with left pleural effusion. Minimal right pleural effusion. Atelectasis medial right base. Stable cardiomegaly.  There is aortic atherosclerosis. Aortic Atherosclerosis (ICD10-I70.0). Electronically Signed   By: Lowella Grip III M.D.   On: 09/07/2017 07:00   Dg Chest 1 View  Result Date: 09/03/2017 CLINICAL DATA:  Status post right-sided thoracentesis EXAM: CHEST  1 VIEW COMPARISON:  09/01/2017 FINDINGS: Cardiac shadow is enlarged. Postsurgical changes are noted. No post thoracentesis pneumothorax is noted. Minimal residual right effusion is seen. Mild atelectatic changes are again noted in the left base. IMPRESSION: No pneumothorax following right thoracentesis. Electronically Signed   By: Inez Catalina  M.D.   On: 09/03/2017 15:47   Dg Chest 2 View  Result Date: 09/04/2017 CLINICAL DATA:  Per order- SOB Hx of cancer, HTN, DM, CHF, AFIB, and COPD. EXAM: CHEST - 2  VIEW COMPARISON:  09/13/2017 FINDINGS: Median sternotomy. The heart is enlarged, stable in configuration. There is pulmonary vascular congestion and mild prominence of interstitial markings, consistent with mild edema. More focal opacity in the RIGHT mid lung zone raises question of early infiltrate. No pleural effusions. Degenerative changes are seen in thoracic spine. IMPRESSION: Cardiomegaly and mild edema. Question of early infiltrate in the RIGHT UPPER lobe. Followup PA and lateral chest X-ray is recommended in 3-4 weeks following trial of antibiotic therapy to ensure resolution and exclude underlying malignancy. Electronically Signed   By: Nolon Nations M.D.   On: 09/04/2017 11:23   Ct Head Wo Contrast  Result Date: 09/08/2017 CLINICAL DATA:  Altered level of consciousness EXAM: CT HEAD WITHOUT CONTRAST TECHNIQUE: Contiguous axial images were obtained from the base of the skull through the vertex without intravenous contrast. COMPARISON:  01/27/16 FINDINGS: Brain: Mild atrophic changes and chronic white matter ischemic change is seen. No findings to suggest acute hemorrhage, acute infarction or space-occupying mass lesion are noted. Vascular: No hyperdense vessel or unexpected calcification. Skull: Normal. Negative for fracture or focal lesion. Sinuses/Orbits: No acute finding. Other: None. IMPRESSION: Chronic atrophic and ischemic changes. No acute abnormality is seen. Electronically Signed   By: Inez Catalina M.D.   On: 09/08/2017 09:34   Ct Chest W Contrast  Result Date: 09/02/2017 CLINICAL DATA:  84 year old with recent diagnosis of colon cancer in the cecum. Patient presented with anemia and had heme-positive stools. Initial staging CT. EXAM: CT CHEST, ABDOMEN, AND PELVIS WITH CONTRAST TECHNIQUE: Multidetector CT imaging of the chest, abdomen and pelvis was performed following the standard protocol during bolus administration of intravenous contrast. CONTRAST:  166mL ISOVUE-300 IOPAMIDOL INJECTION  61% IV. Oral contrast was also administered. COMPARISON:  No prior CT chest, abdomen or pelvis. RIGHT hip CT 03/23/2017 is correlated. FINDINGS: CT CHEST FINDINGS Cardiovascular: Prior CABG. The coronary graft arising from the ANTERIOR aspect of the aorta is difficult to visualize. Heart moderately enlarged. No pericardial effusion. Severe aortic valvular calcification. Severe three-vessel coronary atherosclerosis. Severe atherosclerosis involving the thoracic aorta without evidence of aneurysm. Atherosclerosis involving the proximal great vessels. Central pulmonary arteries patent. Mediastinum/Nodes: No pathologically enlarged mediastinal, hilar or axillary lymph nodes. No mediastinal masses. Normal-appearing esophagus. Normal-appearing thyroid gland. Lungs/Pleura: Emphysematous changes throughout both lungs. Moderately large RIGHT pleural effusion and small LEFT pleural effusion. No enhancing pleural masses. Airspace consolidation with air bronchograms involving the RIGHT LOWER LOBE. Mild passive atelectasis involving the LEFT LOWER LOBE. No pulmonary parenchymal nodules or masses. Central airways patent with mild bronchial wall thickening. Mild LEFT LOWER LOBE bronchiectasis. Musculoskeletal: Degenerative changes throughout the thoracic spine. No acute findings. CT ABDOMEN PELVIS FINDINGS Hepatobiliary: Unopacified hepatic veins. Liver upper normal in size. Mildly irregular hepatic contour. No convincing focal hepatic parenchymal masses. Numerous gallstones in the gallbladder, including cholesterol gallstones, the largest measuring approximately 2.6 cm. No pericholecystic edema or inflammation. Pancreas: Normal in appearance without evidence of mass, ductal dilation, or inflammation. Spleen: Normal in size and appearance. Focus of accessory splenic tissue MEDIAL to the spleen at its ANTERIOR tip. Adrenals/Urinary Tract: Normal appearing adrenal glands. Kidneys normal in size and appearance without focal  parenchymal abnormality. No hydronephrosis. No evidence of urinary tract calculi. Normal appearing urinary bladder. Stomach/Bowel: Stomach decompressed and unremarkable. Normal-appearing small bowel.  Circumferential mass involving the cecum extending over an approximate 5 cm length, without evidence of obstruction. Entire colon relatively decompressed. Distal descending and sigmoid colon diverticulosis without evidence of acute diverticulitis. Sigmoid colon tortuous and elongated. Normal appearing decompressed appendix in the RIGHT UPPER pelvis. Vascular/Lymphatic: Severe aortoiliofemoral and visceral artery atherosclerosis without evidence of aneurysm. No pathologic lymphadenopathy. Reproductive: Normal sized prostate gland containing metallic fiducial markers. Atrophic seminal vesicles. Other: Small BILATERAL inguinal hernias containing fat. Musculoskeletal: Multilevel degenerative disc disease, spondylosis and facet degenerative changes throughout the lumbar spine with severe multifactorial spinal stenosis at L3-4 and moderate to severe multifactorial spinal stenosis at L4-5. No acute findings. IMPRESSION: CT Chest: 1. No evidence of metastatic disease in the chest. 2. Moderate-sized RIGHT pleural effusion and small LEFT pleural effusion. Passive atelectasis and/or pneumonia involving the RIGHT LOWER LOBE and mild passive atelectasis involving the LEFT LOWER LOBE. 3.  No acute cardiopulmonary disease otherwise. 4. Moderate cardiomegaly. Severe three-vessel coronary atherosclerosis. Severe aortic valvular calcification. CT Abdomen Pelvis: 1. Cecal mass which extends over an approximate 5 cm segment. 2. No evidence of metastatic disease in the abdomen or pelvis. 3. Query hepatic cirrhosis. 4. Cholelithiasis without evidence of acute cholecystitis. 5. Distal descending and sigmoid colon diverticulosis without evidence of acute diverticulitis. Aortic Atherosclerosis (ICD10-I70.0) and Emphysema (ICD10-J43.9).  Electronically Signed   By: Evangeline Dakin M.D.   On: 09/02/2017 18:01   Ct Abdomen Pelvis W Contrast  Result Date: 09/02/2017 CLINICAL DATA:  84 year old with recent diagnosis of colon cancer in the cecum. Patient presented with anemia and had heme-positive stools. Initial staging CT. EXAM: CT CHEST, ABDOMEN, AND PELVIS WITH CONTRAST TECHNIQUE: Multidetector CT imaging of the chest, abdomen and pelvis was performed following the standard protocol during bolus administration of intravenous contrast. CONTRAST:  128mL ISOVUE-300 IOPAMIDOL INJECTION 61% IV. Oral contrast was also administered. COMPARISON:  No prior CT chest, abdomen or pelvis. RIGHT hip CT 03/23/2017 is correlated. FINDINGS: CT CHEST FINDINGS Cardiovascular: Prior CABG. The coronary graft arising from the ANTERIOR aspect of the aorta is difficult to visualize. Heart moderately enlarged. No pericardial effusion. Severe aortic valvular calcification. Severe three-vessel coronary atherosclerosis. Severe atherosclerosis involving the thoracic aorta without evidence of aneurysm. Atherosclerosis involving the proximal great vessels. Central pulmonary arteries patent. Mediastinum/Nodes: No pathologically enlarged mediastinal, hilar or axillary lymph nodes. No mediastinal masses. Normal-appearing esophagus. Normal-appearing thyroid gland. Lungs/Pleura: Emphysematous changes throughout both lungs. Moderately large RIGHT pleural effusion and small LEFT pleural effusion. No enhancing pleural masses. Airspace consolidation with air bronchograms involving the RIGHT LOWER LOBE. Mild passive atelectasis involving the LEFT LOWER LOBE. No pulmonary parenchymal nodules or masses. Central airways patent with mild bronchial wall thickening. Mild LEFT LOWER LOBE bronchiectasis. Musculoskeletal: Degenerative changes throughout the thoracic spine. No acute findings. CT ABDOMEN PELVIS FINDINGS Hepatobiliary: Unopacified hepatic veins. Liver upper normal in size. Mildly  irregular hepatic contour. No convincing focal hepatic parenchymal masses. Numerous gallstones in the gallbladder, including cholesterol gallstones, the largest measuring approximately 2.6 cm. No pericholecystic edema or inflammation. Pancreas: Normal in appearance without evidence of mass, ductal dilation, or inflammation. Spleen: Normal in size and appearance. Focus of accessory splenic tissue MEDIAL to the spleen at its ANTERIOR tip. Adrenals/Urinary Tract: Normal appearing adrenal glands. Kidneys normal in size and appearance without focal parenchymal abnormality. No hydronephrosis. No evidence of urinary tract calculi. Normal appearing urinary bladder. Stomach/Bowel: Stomach decompressed and unremarkable. Normal-appearing small bowel. Circumferential mass involving the cecum extending over an approximate 5 cm length, without evidence of obstruction. Entire colon  relatively decompressed. Distal descending and sigmoid colon diverticulosis without evidence of acute diverticulitis. Sigmoid colon tortuous and elongated. Normal appearing decompressed appendix in the RIGHT UPPER pelvis. Vascular/Lymphatic: Severe aortoiliofemoral and visceral artery atherosclerosis without evidence of aneurysm. No pathologic lymphadenopathy. Reproductive: Normal sized prostate gland containing metallic fiducial markers. Atrophic seminal vesicles. Other: Small BILATERAL inguinal hernias containing fat. Musculoskeletal: Multilevel degenerative disc disease, spondylosis and facet degenerative changes throughout the lumbar spine with severe multifactorial spinal stenosis at L3-4 and moderate to severe multifactorial spinal stenosis at L4-5. No acute findings. IMPRESSION: CT Chest: 1. No evidence of metastatic disease in the chest. 2. Moderate-sized RIGHT pleural effusion and small LEFT pleural effusion. Passive atelectasis and/or pneumonia involving the RIGHT LOWER LOBE and mild passive atelectasis involving the LEFT LOWER LOBE. 3.  No  acute cardiopulmonary disease otherwise. 4. Moderate cardiomegaly. Severe three-vessel coronary atherosclerosis. Severe aortic valvular calcification. CT Abdomen Pelvis: 1. Cecal mass which extends over an approximate 5 cm segment. 2. No evidence of metastatic disease in the abdomen or pelvis. 3. Query hepatic cirrhosis. 4. Cholelithiasis without evidence of acute cholecystitis. 5. Distal descending and sigmoid colon diverticulosis without evidence of acute diverticulitis. Aortic Atherosclerosis (ICD10-I70.0) and Emphysema (ICD10-J43.9). Electronically Signed   By: Evangeline Dakin M.D.   On: 09/02/2017 18:01   Dg Chest Port 1 View  Result Date: 09/11/2017 CLINICAL DATA:  Of care associated pneumonia EXAM: PORTABLE CHEST 1 VIEW COMPARISON:  Three days ago FINDINGS: Chronic cardiomegaly and vascular pedicle widening. Stable mild asymmetric opacification at the left base where there is volume loss. Congested appearance of vessels. IMPRESSION: 1. Cardiomegaly and vascular congestion. 2. No focal opacity in the frontal projection. Electronically Signed   By: Monte Fantasia M.D.   On: 09/11/2017 07:12   Dg Chest Port 1 View  Result Date: 09/08/2017 CLINICAL DATA:  Follow-up pneumonia EXAM: PORTABLE CHEST 1 VIEW COMPARISON:  09/07/2017 FINDINGS: Cardiac shadow is enlarged but stable. Aortic calcifications are again seen. Postsurgical changes are noted. Persistent left basilar infiltrate and effusion is seen. The right lung is stable. IMPRESSION: Stable changes in the left base. Electronically Signed   By: Inez Catalina M.D.   On: 09/08/2017 09:03   Dg Chest Port 1 View  Result Date: 09/01/2017 CLINICAL DATA:  84 y.o. male with medical history significant for CAD, Afib not on anticoagulation due to history of recurrent GI bleed felt to be from small gastric AVMs causing iron deficiency anemia. EXAM: PORTABLE CHEST 1 VIEW COMPARISON:  03/22/2017 FINDINGS: Median sternotomy. The heart is enlarged. There is  perihilar airspace filling and prominent interstitial markings consistent with pulmonary edema. No focal consolidations. IMPRESSION: Cardiomegaly and mild pulmonary edema. Electronically Signed   By: Nolon Nations M.D.   On: 09/01/2017 16:18   US Thoracentesis Asp Pleural Space W/img Guide  Result Date: 09/03/2017 INDICATION: Patient with history of shortness of breath and right pleural effusion. Request is made for diagnostic and therapeutic right thoracentesis. EXAM: ULTRASOUND GUIDED DIAGNOSTIC AND THERAPEUTIC RIGHT THORACENTESIS MEDICATIONS: 10 mL of 1% lidocaine COMPLICATIONS: None immediate. PROCEDURE: An ultrasound guided thoracentesis was thoroughly discussed with the patient and questions answered. The benefits, risks, alternatives and complications were also discussed. The patient understands and wishes to proceed with the procedure. Written consent was obtained. Ultrasound was performed to localize and mark an adequate pocket of fluid in the right chest. The area was then prepped and draped in the normal sterile fashion. 1% Lidocaine was used for local anesthesia. Under ultrasound guidance a 6 Fr  Safe-T-Centesis catheter was introduced. Thoracentesis was performed. The catheter was removed and a dressing applied. FINDINGS: A total of approximately 650 mL of clear gold fluid was removed. Samples were sent to the laboratory as requested by the clinical team. IMPRESSION: Successful ultrasound guided right thoracentesis yielding 650 mL of pleural fluid. Read by: Earley Abide, PA-C Electronically Signed   By: Jacqulynn Cadet M.D.   On: 09/03/2017 15:35    Microbiology: No results found for this or any previous visit (from the past 240 hour(s)).   Labs: CBC: Recent Labs  Lab 09/14/17 0340 09/15/17 0457 09/16/17 0541 09/16/17 1015  WBC 4.2 4.6 5.4  --   NEUTROABS 3.0 3.2 4.0  --   HGB 8.1* 8.3* 7.9* 8.4*  HCT 24.9* 25.7* 24.9* 26.4*  MCV 91.5 91.5 92.2  --   PLT 258 306 306  --     Basic Metabolic Panel: Recent Labs  Lab 09/14/17 0340 09/15/17 0457 09/16/17 0541  NA 143 142 141  K 3.2* 3.2* 3.2*  CL 109 104 104  CO2 28 29 28   GLUCOSE 109* 98 148*  BUN 9 9 10   CREATININE 0.92 0.94 1.17  CALCIUM 8.0* 8.2* 8.4*   Liver Function Tests: No results for input(s): AST, ALT, ALKPHOS, BILITOT, PROT, ALBUMIN in the last 168 hours. No results for input(s): LIPASE, AMYLASE in the last 168 hours. No results for input(s): AMMONIA in the last 168 hours. Cardiac Enzymes: No results for input(s): CKTOTAL, CKMB, CKMBINDEX, TROPONINI in the last 168 hours. BNP (last 3 results) Recent Labs    09/01/17 0037  BNP 237.1*   CBG: Recent Labs  Lab 09/15/17 1157 09/15/17 1644 09/15/17 2120 09/16/17 0800 09/16/17 1222  GLUCAP 160* 119* 175* 133* 164*   Time spent: 35 minutes  Signed:  Berle Mull  [facility] 09/16/2017 , 6:01 PM

## 2017-09-21 DIAGNOSIS — D631 Anemia in chronic kidney disease: Secondary | ICD-10-CM | POA: Diagnosis not present

## 2017-09-21 DIAGNOSIS — E785 Hyperlipidemia, unspecified: Secondary | ICD-10-CM | POA: Diagnosis not present

## 2017-09-21 DIAGNOSIS — K922 Gastrointestinal hemorrhage, unspecified: Secondary | ICD-10-CM | POA: Diagnosis not present

## 2017-09-21 DIAGNOSIS — Z483 Aftercare following surgery for neoplasm: Secondary | ICD-10-CM | POA: Diagnosis not present

## 2017-09-21 DIAGNOSIS — L89610 Pressure ulcer of right heel, unstageable: Secondary | ICD-10-CM | POA: Diagnosis not present

## 2017-09-21 DIAGNOSIS — I700 Atherosclerosis of aorta: Secondary | ICD-10-CM | POA: Diagnosis not present

## 2017-09-21 DIAGNOSIS — I509 Heart failure, unspecified: Secondary | ICD-10-CM | POA: Diagnosis not present

## 2017-09-21 DIAGNOSIS — Z6833 Body mass index (BMI) 33.0-33.9, adult: Secondary | ICD-10-CM | POA: Diagnosis not present

## 2017-09-21 DIAGNOSIS — G8929 Other chronic pain: Secondary | ICD-10-CM | POA: Diagnosis not present

## 2017-09-21 DIAGNOSIS — E1122 Type 2 diabetes mellitus with diabetic chronic kidney disease: Secondary | ICD-10-CM | POA: Diagnosis not present

## 2017-09-21 DIAGNOSIS — F419 Anxiety disorder, unspecified: Secondary | ICD-10-CM | POA: Diagnosis not present

## 2017-09-21 DIAGNOSIS — I2510 Atherosclerotic heart disease of native coronary artery without angina pectoris: Secondary | ICD-10-CM | POA: Diagnosis not present

## 2017-09-21 DIAGNOSIS — N189 Chronic kidney disease, unspecified: Secondary | ICD-10-CM | POA: Diagnosis not present

## 2017-09-21 DIAGNOSIS — F319 Bipolar disorder, unspecified: Secondary | ICD-10-CM | POA: Diagnosis not present

## 2017-09-21 DIAGNOSIS — I481 Persistent atrial fibrillation: Secondary | ICD-10-CM | POA: Diagnosis not present

## 2017-09-21 DIAGNOSIS — M25551 Pain in right hip: Secondary | ICD-10-CM | POA: Diagnosis not present

## 2017-09-21 DIAGNOSIS — L89152 Pressure ulcer of sacral region, stage 2: Secondary | ICD-10-CM | POA: Diagnosis not present

## 2017-09-21 DIAGNOSIS — I130 Hypertensive heart and chronic kidney disease with heart failure and stage 1 through stage 4 chronic kidney disease, or unspecified chronic kidney disease: Secondary | ICD-10-CM | POA: Diagnosis not present

## 2017-09-21 DIAGNOSIS — E669 Obesity, unspecified: Secondary | ICD-10-CM | POA: Diagnosis not present

## 2017-09-21 DIAGNOSIS — I350 Nonrheumatic aortic (valve) stenosis: Secondary | ICD-10-CM | POA: Diagnosis not present

## 2017-09-21 DIAGNOSIS — D500 Iron deficiency anemia secondary to blood loss (chronic): Secondary | ICD-10-CM | POA: Diagnosis not present

## 2017-09-21 DIAGNOSIS — S72001S Fracture of unspecified part of neck of right femur, sequela: Secondary | ICD-10-CM | POA: Diagnosis not present

## 2017-09-21 DIAGNOSIS — J439 Emphysema, unspecified: Secondary | ICD-10-CM | POA: Diagnosis not present

## 2017-09-21 DIAGNOSIS — I272 Pulmonary hypertension, unspecified: Secondary | ICD-10-CM | POA: Diagnosis not present

## 2017-09-21 DIAGNOSIS — S21209D Unspecified open wound of unspecified back wall of thorax without penetration into thoracic cavity, subsequent encounter: Secondary | ICD-10-CM | POA: Diagnosis not present

## 2017-09-22 DIAGNOSIS — N183 Chronic kidney disease, stage 3 (moderate): Secondary | ICD-10-CM | POA: Diagnosis not present

## 2017-09-22 DIAGNOSIS — L89152 Pressure ulcer of sacral region, stage 2: Secondary | ICD-10-CM | POA: Diagnosis not present

## 2017-09-22 DIAGNOSIS — L0390 Cellulitis, unspecified: Secondary | ICD-10-CM | POA: Diagnosis not present

## 2017-09-22 DIAGNOSIS — R609 Edema, unspecified: Secondary | ICD-10-CM | POA: Diagnosis not present

## 2017-09-22 DIAGNOSIS — L97409 Non-pressure chronic ulcer of unspecified heel and midfoot with unspecified severity: Secondary | ICD-10-CM | POA: Diagnosis not present

## 2017-09-22 DIAGNOSIS — C189 Malignant neoplasm of colon, unspecified: Secondary | ICD-10-CM | POA: Diagnosis not present

## 2017-09-27 ENCOUNTER — Encounter: Payer: Self-pay | Admitting: Cardiology

## 2017-09-27 ENCOUNTER — Ambulatory Visit: Payer: PPO | Admitting: Cardiology

## 2017-09-27 VITALS — BP 132/80 | HR 84 | Ht 70.0 in | Wt 226.2 lb

## 2017-09-27 DIAGNOSIS — I350 Nonrheumatic aortic (valve) stenosis: Secondary | ICD-10-CM

## 2017-09-27 DIAGNOSIS — J449 Chronic obstructive pulmonary disease, unspecified: Secondary | ICD-10-CM | POA: Diagnosis not present

## 2017-09-27 DIAGNOSIS — I10 Essential (primary) hypertension: Secondary | ICD-10-CM

## 2017-09-27 DIAGNOSIS — K922 Gastrointestinal hemorrhage, unspecified: Secondary | ICD-10-CM

## 2017-09-27 DIAGNOSIS — Z0389 Encounter for observation for other suspected diseases and conditions ruled out: Secondary | ICD-10-CM

## 2017-09-27 DIAGNOSIS — I482 Chronic atrial fibrillation: Secondary | ICD-10-CM | POA: Diagnosis not present

## 2017-09-27 DIAGNOSIS — I481 Persistent atrial fibrillation: Secondary | ICD-10-CM

## 2017-09-27 DIAGNOSIS — E118 Type 2 diabetes mellitus with unspecified complications: Secondary | ICD-10-CM

## 2017-09-27 DIAGNOSIS — IMO0001 Reserved for inherently not codable concepts without codable children: Secondary | ICD-10-CM

## 2017-09-27 DIAGNOSIS — D500 Iron deficiency anemia secondary to blood loss (chronic): Secondary | ICD-10-CM | POA: Diagnosis not present

## 2017-09-27 DIAGNOSIS — I4821 Permanent atrial fibrillation: Secondary | ICD-10-CM

## 2017-09-27 DIAGNOSIS — I4819 Other persistent atrial fibrillation: Secondary | ICD-10-CM

## 2017-09-27 NOTE — Assessment & Plan Note (Signed)
S/P transthoracic MAZE in 2001 CAF now with CVR

## 2017-09-27 NOTE — Assessment & Plan Note (Signed)
Cath no significant CAD 1997, Myoview low risk 2015

## 2017-09-27 NOTE — Assessment & Plan Note (Signed)
Echo 09/01/17

## 2017-09-27 NOTE — Assessment & Plan Note (Signed)
NIDDM- on Glucophage 

## 2017-09-27 NOTE — Assessment & Plan Note (Signed)
On chronic O2 

## 2017-09-27 NOTE — Assessment & Plan Note (Addendum)
Multifactorial, chronic GI blood loss secondary to AVMs and suspected poor production

## 2017-09-27 NOTE — Patient Instructions (Signed)
Medication Instructions:  No Changes. If you need a refill on your cardiac medications before your next appointment, please call your pharmacy.  Labwork: None Ordered.  Testing/Procedures: None Ordered.   Follow-Up: Your physician wants you to follow-up in: 6 weeks with Dr.Hochrein.    Thank you for choosing [facility] at [facility]!!

## 2017-09-27 NOTE — Assessment & Plan Note (Signed)
Controlled.  

## 2017-09-27 NOTE — Assessment & Plan Note (Signed)
Unclear source. 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 He does have a history of AVMs. Prio small bowel study was to be done at [facility] but that program shut down before he could be seen there. He has been seen by Heme/Onc but couldn't keep his f/u secondary to a fall at home. CHADS VASC=4.  Too high risk at this time for anticoagulation per Dr Penelope Coop.

## 2017-09-27 NOTE — Progress Notes (Signed)
09/27/2017 Joel [name]   [birth year]  [id number]  Primary Physician Wenda Low, MD Primary Cardiologist: Dr Percival Spanish  HPI:  84 y/o male with an interesting cardiac history, he had prior coronary angiogram in 1997 that was negative for significant CAD. In 2001 he had [facility]hor[facility]ZE at [facility]. He has had CAF and has had recurrent GI bleeding on anticoagulation. He has seen several gastroenterologist, most recently Dr Oletta Lamas. A colonoscopy done 09/02/17 revealed a cecal mass. He underwent laparoscopic colon resection 09/05/17 but had recurrent GI bleeding and had to have partial colectomy on 09/10/17. It was thought that this would alleviate his bleeding but ultimatley it was felt that the patient was still too high of a risk for anticoagulation, (see Dr Estell Harpin note 09/14/17). From a cardiac standpoint he has done well. He denies chest pain. He is "weak as water". He had his labs checked by his PCP a few days ago and I will not repeat those today.    Current Outpatient Medications  Medication Sig Dispense Refill  . allopurinol (ZYLOPRIM) 100 MG tablet Take 100 mg by mouth daily.     . Ascorbic Acid (VITAMIN C PO) Take 1 tablet by mouth daily.    . B Complex-C (B-COMPLEX WITH VITAMIN C) tablet Take 1 tablet by mouth daily. 60 tablet 0  . buPROPion (WELLBUTRIN XL) 150 MG 24 hr tablet Take 1 tablet (150 mg total) by mouth daily. 30 tablet 1  . cephALEXin (KEFLEX) 500 MG capsule TK 1 C PO BID FOR 10 DAYS  0  . cholecalciferol (VITAMIN D) 1000 units tablet Take 1,000 Units by mouth daily.    . citalopram (CELEXA) 20 MG tablet Take 1 tablet (20 mg total) by mouth daily. 30 tablet 0  . Cyanocobalamin (VITAMIN B-12 PO) Take 1 tablet by mouth daily.    Marland Kitchen diltiazem (CARDIZEM CD) 120 MG 24 hr capsule Take 1 capsule (120 mg total) by mouth daily. 60 capsule 0  . feeding supplement (ENSURE SURGERY) LIQD Take 237 mLs by mouth 2 (two) times daily between meals. 90 Bottle 0  . fluticasone  (FLONASE) 50 MCG/ACT nasal spray Place 2 sprays into both nostrils daily. (Patient taking differently: Place 2 sprays into both nostrils daily as needed for allergies or rhinitis. ) 16 g 1  . furosemide (LASIX) 80 MG tablet Take 1 tablet (80 mg total) by mouth daily. 30 tablet 0  . gabapentin (NEURONTIN) 100 MG capsule Take 2 capsules (200 mg total) by mouth 2 (two) times daily. 60 capsule 0  . glimepiride (AMARYL) 1 MG tablet Take 1 mg by mouth daily with breakfast.   0  . guaiFENesin (MUCINEX) 600 MG 12 hr tablet Take 2 tablets (1,200 mg total) by mouth 2 (two) times daily. 30 tablet 0  . iron polysaccharides (NIFEREX) 150 MG capsule Take 1 capsule (150 mg total) by mouth 2 (two) times daily. 60 capsule 0  . lovastatin (MEVACOR) 20 MG tablet Take 20 mg by mouth at bedtime.     . meclizine (ANTIVERT) 25 MG tablet Take 1 tablet (25 mg total) by mouth 3 (three) times daily as needed for dizziness. (Patient taking differently: Take 25 mg by mouth 2 (two) times daily. ) 30 tablet 0  . Multiple Vitamins-Minerals (VISION FORMULA PO) Take 1 tablet by mouth 2 (two) times daily.    . OXYGEN Inhale 2 L into the lungs as needed (shortness of breath).     . pantoprazole (PROTONIX) 40 MG tablet  Take 1 tablet (40 mg total) by mouth 2 (two) times daily. 60 tablet 0  . potassium chloride SA (K-DUR,KLOR-CON) 20 MEQ tablet Take 20 mEq by mouth daily.   0  . senna-docusate (SENOKOT-S) 8.6-50 MG tablet Take 2 tablets by mouth 2 (two) times daily. 120 tablet 1  . temazepam (RESTORIL) 15 MG capsule Take 1 capsule (15 mg total) by mouth at bedtime. 30 capsule 0  . metFORMIN (GLUCOPHAGE) 1000 MG tablet Take 1,000 mg by mouth 2 (two) times daily.   0   No current facility-administered medications for this visit.     Allergies  Allergen Reactions  . Penicillins Anaphylaxis    Has patient had a PCN reaction causing immediate rash, facial/tongue/throat swelling, SOB or lightheadedness with hypotension: unknown Has  patient had a PCN reaction causing severe rash involving mucus membranes or skin necrosis: unknown Has patient had a PCN reaction that required hospitalization: unknown Has patient had a PCN reaction occurring within the last 10 years: no If all of the above answers are "NO", then may proceed with Cephalosporin use.   Marland Kitchen Antihistamines, Loratadine-Type Other (See Comments)    depression  . Other     Beta blockers-Novacaine  Caused depression    Past Medical History:  Diagnosis Date  . Allergic rhinitis   . Anemia   . Anxiety   . Aortic stenosis 2012   mild to mod   . Atrial fibrillation ([place]) 1991   with multiple DCCV  . Bipolar affective disorder ([place])   . Cancer of ascending colon ([place]) 09/05/2017  . CHF (congestive heart failure) ([place]) 08/30/2017  . COPD (chronic obstructive pulmonary disease) ([place])   . Diabetic neuropathy ([place])    in feet  . Diabetic neuropathy ([place])   . Dyslipidemia   . Fatigue    last year or so  . Gout   . Hypertension   . Insomnia   . Obesity   . On home oxygen therapy 2 1/2 liters with bipap at night  . OSA (obstructive sleep apnea)    severe, uses bipap 16 1/2 by 12 or 13 setting  . Prostate cancer ([place])   . Rectal bleeding 12/16/2015  . Type 2 diabetes mellitus ([place])   . Vertigo     Social History   Socioeconomic History  . Marital status: Widowed    Spouse name: Not on file  . Number of children: Not on file  . Years of education: Not on file  . Highest education level: Not on file  Occupational History  . Occupation: Event organiser  . Financial resource strain: Not on file  . Food insecurity:    Worry: Not on file    Inability: Not on file  . Transportation needs:    Medical: Not on file    Non-medical: Not on file  Tobacco Use  . Smoking status: Former Smoker    Packs/day: 2.00    Years: 25.00    Pack years: 50.00    Types: Cigarettes    Last attempt to quit: 07/08/1976    Years since quitting: 41.2  . Smokeless  tobacco: Never Used  Substance and Sexual Activity  . Alcohol use: No    Comment: in AA since 1977, no alcohol since 1977  . Drug use: No    Types: Marijuana    Comment: marijuana use 37 years ago  . Sexual activity: Yes  Lifestyle  . Physical activity:    Days per week: Not on file  Minutes per session: Not on file  . Stress: Not on file  Relationships  . Social connections:    Talks on phone: Not on file    Gets together: Not on file    Attends religious service: Not on file    Active member of club or organization: Not on file    Attends meetings of clubs or organizations: Not on file    Relationship status: Not on file  . Intimate partner violence:    Fear of current or ex partner: Not on file    Emotionally abused: Not on file    Physically abused: Not on file    Forced sexual activity: Not on file  Other Topics Concern  . Not on file  Social History Narrative  . Not on file     Family History  Problem Relation Age of Onset  . Tuberculosis Maternal Grandmother   . Heart attack Neg Hx   . Diabetes Neg Hx   . CAD Neg Hx   . Cancer Neg Hx      Review of Systems: General: negative for chills, fever, night sweats or weight changes.  Cardiovascular: negative for chest pain, dyspnea on exertion, edema, orthopnea, palpitations, paroxysmal nocturnal dyspnea or shortness of breath Dermatological: negative for rash Respiratory: negative for cough or wheezing Urologic: negative for hematuria Abdominal: negative for nausea, vomiting, diarrhea, bright red blood per rectum, melena, or hematemesis Neurologic: negative for visual changes, syncope, or dizziness All other systems reviewed and are otherwise negative except as noted above.    Blood pressure 132/80, pulse 84, height 5\' 10"  (1.778 m), weight 226 lb 3.2 oz (102.6 kg).  General appearance: alert, cooperative, no distress, moderately obese and in a wheel chair Neck: no JVD Lungs: clear to auscultation  bilaterally Heart: regular rate and rhythm and 2/6 systolic murmur AOV Extremities: trace LE edema Skin: pale, cool, dry Neurologic: Grossly normal  EKG AF-VR 84  Echo- 09/01/17 Study Conclusions  - Left ventricle: Wall thickness was increased in a pattern of   moderate LVH. Systolic function was normal. The estimated   ejection fraction was in the range of 55% to 60%. The study is   not technically sufficient to allow evaluation of LV diastolic   function. - Aortic valve: There was moderate stenosis. Valve area (VTI): 1.57   cm^2. Valve area (Vmax): 1.74 cm^2. Valve area (Vmean): 1.53   cm^2. - Mitral valve: Moderately calcified annulus. Mildly thickened,   mildly calcified leaflets . There was mild regurgitation. Valve   area by continuity equation (using LVOT flow): 2.59 cm^2. - Left atrium: The atrium was moderately dilated. - Atrial septum: No defect or patent foramen ovale was identified. - Pulmonary arteries: PA peak pressure: 65 mm Hg (S). - Impressions: Gradients have increased since 2017 when they were   mean 16 mmHg and peak 26 mmHg.  Impressions:  - Gradients have increased since 2017 when they were mean 16 mmHg   and peak 26 mmHg.  ASSESSMENT AND PLAN:   Persistent atrial fibrillation (HCC) S/P transthoracic MAZE in 2001 CAF now with CVR  Chronic GI bleeding Unclear source. 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 He does have a history of AVMs. Prio small bowel study was to be done at [facility] but that program shut down before he could be seen there. He has been seen by Heme/Onc but couldn't keep his f/u secondary to a fall at home.  CHADS VASC=4.  Too high risk at this time for anticoagulation per Dr Penelope Coop.   COPD mixed type ([place]) On chronic O2  Diabetes mellitus with complication (HCC) NIDDM on Glucophage  Hypertension Controlled  Normal coronary arteries Cath no  significant CAD 1997, Myoview low risk 2015  Moderate aortic stenosis Echo 09/01/17  Anemia, iron deficiency Multifactorial, chronic GI blood loss secondary to AVMs and suspected poor production   PLAN  Same cardiac Rx for now- no anticoagulation.  Will review with Dr Percival Spanish. F/U in 4-6 weeks. Consider anticoagulation in the future if his Hgb normalizes.   Kerin Ransom PA-C 09/27/2017 2:20 PM

## 2017-09-28 ENCOUNTER — Other Ambulatory Visit: Payer: Self-pay | Admitting: *Deleted

## 2017-09-28 ENCOUNTER — Encounter: Payer: Self-pay | Admitting: *Deleted

## 2017-09-28 DIAGNOSIS — E662 Morbid (severe) obesity with alveolar hypoventilation: Secondary | ICD-10-CM | POA: Diagnosis not present

## 2017-09-28 DIAGNOSIS — G4733 Obstructive sleep apnea (adult) (pediatric): Secondary | ICD-10-CM | POA: Diagnosis not present

## 2017-09-28 DIAGNOSIS — J449 Chronic obstructive pulmonary disease, unspecified: Secondary | ICD-10-CM | POA: Diagnosis not present

## 2017-09-28 DIAGNOSIS — J410 Simple chronic bronchitis: Secondary | ICD-10-CM | POA: Diagnosis not present

## 2017-09-28 DIAGNOSIS — D500 Iron deficiency anemia secondary to blood loss (chronic): Secondary | ICD-10-CM | POA: Diagnosis not present

## 2017-09-28 DIAGNOSIS — C189 Malignant neoplasm of colon, unspecified: Secondary | ICD-10-CM | POA: Diagnosis not present

## 2017-09-28 DIAGNOSIS — M6281 Muscle weakness (generalized): Secondary | ICD-10-CM | POA: Diagnosis not present

## 2017-09-28 DIAGNOSIS — Z9181 History of falling: Secondary | ICD-10-CM | POA: Diagnosis not present

## 2017-09-28 DIAGNOSIS — I2510 Atherosclerotic heart disease of native coronary artery without angina pectoris: Secondary | ICD-10-CM | POA: Diagnosis not present

## 2017-09-28 DIAGNOSIS — K922 Gastrointestinal hemorrhage, unspecified: Secondary | ICD-10-CM | POA: Diagnosis not present

## 2017-09-28 DIAGNOSIS — S72011D Unspecified intracapsular fracture of right femur, subsequent encounter for closed fracture with routine healing: Secondary | ICD-10-CM | POA: Diagnosis not present

## 2017-09-28 DIAGNOSIS — I700 Atherosclerosis of aorta: Secondary | ICD-10-CM | POA: Diagnosis not present

## 2017-09-28 DIAGNOSIS — Z483 Aftercare following surgery for neoplasm: Secondary | ICD-10-CM | POA: Diagnosis not present

## 2017-09-28 DIAGNOSIS — E669 Obesity, unspecified: Secondary | ICD-10-CM | POA: Diagnosis not present

## 2017-09-28 DIAGNOSIS — Z6833 Body mass index (BMI) 33.0-33.9, adult: Secondary | ICD-10-CM | POA: Diagnosis not present

## 2017-09-28 NOTE — Patient Outreach (Signed)
[facility]) Care Management   09/28/2017  Joel [name] [birth year] [id number]  Joel [name] is an 84 y.o. male  Subjective:   Member alert and oriented x3, denies any pain or discomfort at this time.  Report compliance with medications, denies seeing the surgeon since being discharged from hospital.  Has appointment with primary MD tomorrow.  Objective:   Review of Systems  Constitutional: Negative.   HENT: Negative.   Eyes: Negative.   Respiratory: Negative.   Cardiovascular: Negative.   Gastrointestinal: Negative.   Genitourinary: Negative.   Musculoskeletal: Negative.   Skin:       Abdominal incision  Neurological: Negative.   Endo/Heme/Allergies: Negative.   Psychiatric/Behavioral: Negative.     Physical Exam  Constitutional: He is oriented to person, place, and time. He appears well-developed and well-nourished.  Neck: Normal range of motion.  Cardiovascular:  Atrial fibrillation  Respiratory: Effort normal and breath sounds normal.  GI: Soft. Bowel sounds are normal.  Musculoskeletal: Normal range of motion.  Neurological: He is alert and oriented to person, place, and time.  Skin: Skin is warm and dry.   BP 120/62 (BP Location: Left Arm, Patient Position: Sitting, Cuff Size: Normal)   Pulse 72   Resp 18   Ht 1.829 m (6')   Wt 220 lb (99.8 kg)   SpO2 98%   BMI 29.84 kg/m   Encounter Medications:   Outpatient Encounter Medications as of 09/28/2017  Medication Sig  . Ascorbic Acid (VITAMIN C PO) Take 1 tablet by mouth daily.  . B Complex-C (B-COMPLEX WITH VITAMIN C) tablet Take 1 tablet by mouth daily.  Marland Kitchen buPROPion (WELLBUTRIN XL) 150 MG 24 hr tablet Take 1 tablet (150 mg total) by mouth daily.  . cephALEXin (KEFLEX) 500 MG capsule TK 1 C PO BID FOR 10 DAYS  . cholecalciferol (VITAMIN D) 1000 units tablet Take 1,000 Units by mouth daily.  . citalopram (CELEXA) 20 MG tablet Take 1 tablet (20 mg total) by mouth daily.  Marland Kitchen diltiazem  (CARDIZEM CD) 120 MG 24 hr capsule Take 1 capsule (120 mg total) by mouth daily.  . fluticasone (FLONASE) 50 MCG/ACT nasal spray Place 2 sprays into both nostrils daily. (Patient taking differently: Place 2 sprays into both nostrils daily as needed for allergies or rhinitis. )  . furosemide (LASIX) 80 MG tablet Take 1 tablet (80 mg total) by mouth daily.  Marland Kitchen gabapentin (NEURONTIN) 100 MG capsule Take 2 capsules (200 mg total) by mouth 2 (two) times daily.  Marland Kitchen glimepiride (AMARYL) 1 MG tablet Take 1 mg by mouth daily with breakfast.   . guaiFENesin (MUCINEX) 600 MG 12 hr tablet Take 2 tablets (1,200 mg total) by mouth 2 (two) times daily.  . iron polysaccharides (NIFEREX) 150 MG capsule Take 1 capsule (150 mg total) by mouth 2 (two) times daily.  . meclizine (ANTIVERT) 25 MG tablet Take 1 tablet (25 mg total) by mouth 3 (three) times daily as needed for dizziness. (Patient taking differently: Take 25 mg by mouth 2 (two) times daily. )  . Multiple Vitamins-Minerals (VISION FORMULA PO) Take 1 tablet by mouth 2 (two) times daily.  . OXYGEN Inhale 2 L into the lungs as needed (shortness of breath).   . pantoprazole (PROTONIX) 40 MG tablet Take 1 tablet (40 mg total) by mouth 2 (two) times daily.  . potassium chloride SA (K-DUR,KLOR-CON) 20 MEQ tablet Take 20 mEq by mouth daily.   Marland Kitchen senna-docusate (SENOKOT-S) 8.6-50 MG tablet Take 2  tablets by mouth 2 (two) times daily.  . temazepam (RESTORIL) 15 MG capsule Take 1 capsule (15 mg total) by mouth at bedtime.  Marland Kitchen allopurinol (ZYLOPRIM) 100 MG tablet Take 100 mg by mouth daily.   . Cyanocobalamin (VITAMIN B-12 PO) Take 1 tablet by mouth daily.  . feeding supplement (ENSURE SURGERY) LIQD Take 237 mLs by mouth 2 (two) times daily between meals. (Patient not taking: Reported on 09/28/2017)  . lovastatin (MEVACOR) 20 MG tablet Take 20 mg by mouth at bedtime.   . metFORMIN (GLUCOPHAGE) 1000 MG tablet Take 1,000 mg by mouth 2 (two) times daily.    No  facility-administered encounter medications on file as of 09/28/2017.     Functional Status:   In your present state of health, do you have any difficulty performing the following activities: 09/28/2017 08/30/2017  Hearing? [place]? N -  Difficulty concentrating or making decisions? N -  Walking or climbing stairs? Y -  Dressing or bathing? N -  Doing errands, shopping? [place]  Preparing Food and eating ? N -  Using the Toilet? N -  In the past six months, have you accidently leaked urine? N -  Do you have problems with loss of bowel control? N -  Managing your Medications? N -  Managing your Finances? N -  Housekeeping or managing your Housekeeping? Y -  Some recent data might be hidden    Fall/Depression Screening:    Fall Risk  09/28/2017 03/29/2017 07/14/2016  Falls in the past year? Yes Yes No  Number falls in past yr: 1 1 -  Injury with Fall? Yes Yes -  Risk Factor Category  High Fall Risk - -  Risk for fall due to : History of fall(s) Impaired balance/gait;Impaired mobility -  Follow up Education provided Education provided;Follow up appointment;Falls prevention discussed -   PHQ 2/9 Scores 09/28/2017 03/29/2017 07/14/2016 03/30/2016 01/07/2016 01/07/2016  PHQ - 2 Score 1 0 0 0 0 0    Assessment:    Met with member at scheduled time.  He lives alone but daughter is very active in his care, lives less than 2 miles away.  Also has friend that visits daily to help with household needs, walks his dog, and takes him to MD appointments.    Member reports he is active with home health for nursing and PT.  Request for this care manager to change dressing due to getting it wet in the shower today.  Wound not complete closed.  Sutures noted just under the epidermal layer of skin.  Yellow slough also noted covering most of the wound bed, edges pink.  He denies any fever or other signs of infection.  State either the home health nurse or his daughter has been changing dressing daily.  Wet to  dry dressing applied, surgeon's office notified of wound appearance.  Initial appointment received for tomorrow, but office called back to have member come in today.  He and his friend is aware, will attend.  Medications reviewed, he does not have allopurinol and vitamin b12, but state he will have his friend pick up refills from pharmacy.  This care manager attempted to help member fill pill box, but he declined stating that he would refill it later today.  Denies any urgent concerns at this time.  Unsure if he will follow up with oncology, will discuss with surgeon.  Provided with this care manager's contact information, advised to contact with questions.  Plan:  Will follow up with member within the next 2 weeks.  THN CM Care Plan Problem One     Most Recent Value  Care Plan Problem One  Knowledge deficit related to new diagnosis of colon cancer as evidenced by recent hospital admission  Role Documenting the Problem One  Care Management [place] for Problem One  Active  [facility] Long Term Goal   Member will not be readmitted back to hospital within 31 days of discharge  [facility] Long Term Goal Start Date  09/28/17  Interventions for Problem One Long Term Goal  Discussed with member the importance of following discharge instructions, including follow up appointments, medications, diet, and home health involvement, to decrease the risk of readmission  THN CM Short Term Goal #1   Member will have follow up appointment with surgeon within the next 2 weeks  THN CM Short Term Goal #1 Start Date  09/28/17  Interventions for Short Term Goal #1  Call placed to surgeon's office to report condition of wound bed and to request follow up appointment.  THN CM Short Term Goal #2   Member will report compliance with medications as prescribed over the next 4 weeks  THN CM Short Term Goal #2 Start Date  09/28/17  Interventions for Short Term Goal #2  Medications reviewed in the home compared to  discharge instructions.  Offered to assist with filling pill box, member declined.     [name], [place], MSN [place] [phone]

## 2017-09-29 DIAGNOSIS — S31109D Unspecified open wound of abdominal wall, unspecified quadrant without penetration into peritoneal cavity, subsequent encounter: Secondary | ICD-10-CM | POA: Diagnosis not present

## 2017-09-29 DIAGNOSIS — R600 Localized edema: Secondary | ICD-10-CM | POA: Diagnosis not present

## 2017-09-29 DIAGNOSIS — L89152 Pressure ulcer of sacral region, stage 2: Secondary | ICD-10-CM | POA: Diagnosis not present

## 2017-09-29 DIAGNOSIS — L97409 Non-pressure chronic ulcer of unspecified heel and midfoot with unspecified severity: Secondary | ICD-10-CM | POA: Diagnosis not present

## 2017-10-05 ENCOUNTER — Telehealth: Payer: Self-pay | Admitting: Oncology

## 2017-10-05 ENCOUNTER — Encounter: Payer: Self-pay | Admitting: Oncology

## 2017-10-05 NOTE — Telephone Encounter (Signed)
New referral received from Dr. Dalbert Batman for colon cancer. Pt has been scheduled to see Dr. Benay Spice on 9/11 at 1130am. I was unable to reach the pt to make aware of the appt date and time. I lft the appt information on the pt's voicemail. Letter mailed.

## 2017-10-11 ENCOUNTER — Other Ambulatory Visit: Payer: Self-pay | Admitting: *Deleted

## 2017-10-11 ENCOUNTER — Encounter (HOSPITAL_BASED_OUTPATIENT_CLINIC_OR_DEPARTMENT_OTHER): Payer: PPO | Attending: Internal Medicine

## 2017-10-11 DIAGNOSIS — E11621 Type 2 diabetes mellitus with foot ulcer: Secondary | ICD-10-CM | POA: Diagnosis not present

## 2017-10-11 DIAGNOSIS — Z8546 Personal history of malignant neoplasm of prostate: Secondary | ICD-10-CM | POA: Insufficient documentation

## 2017-10-11 DIAGNOSIS — L97412 Non-pressure chronic ulcer of right heel and midfoot with fat layer exposed: Secondary | ICD-10-CM | POA: Diagnosis not present

## 2017-10-11 DIAGNOSIS — Z923 Personal history of irradiation: Secondary | ICD-10-CM | POA: Diagnosis not present

## 2017-10-11 DIAGNOSIS — N182 Chronic kidney disease, stage 2 (mild): Secondary | ICD-10-CM | POA: Diagnosis not present

## 2017-10-11 DIAGNOSIS — M109 Gout, unspecified: Secondary | ICD-10-CM | POA: Insufficient documentation

## 2017-10-11 DIAGNOSIS — Z9049 Acquired absence of other specified parts of digestive tract: Secondary | ICD-10-CM | POA: Diagnosis not present

## 2017-10-11 DIAGNOSIS — E1122 Type 2 diabetes mellitus with diabetic chronic kidney disease: Secondary | ICD-10-CM | POA: Diagnosis not present

## 2017-10-11 DIAGNOSIS — L89612 Pressure ulcer of right heel, stage 2: Secondary | ICD-10-CM | POA: Insufficient documentation

## 2017-10-11 DIAGNOSIS — J449 Chronic obstructive pulmonary disease, unspecified: Secondary | ICD-10-CM | POA: Insufficient documentation

## 2017-10-11 DIAGNOSIS — Y838 Other surgical procedures as the cause of abnormal reaction of the patient, or of later complication, without mention of misadventure at the time of the procedure: Secondary | ICD-10-CM | POA: Insufficient documentation

## 2017-10-11 DIAGNOSIS — E1151 Type 2 diabetes mellitus with diabetic peripheral angiopathy without gangrene: Secondary | ICD-10-CM | POA: Diagnosis not present

## 2017-10-11 DIAGNOSIS — I129 Hypertensive chronic kidney disease with stage 1 through stage 4 chronic kidney disease, or unspecified chronic kidney disease: Secondary | ICD-10-CM | POA: Diagnosis not present

## 2017-10-11 DIAGNOSIS — Z87891 Personal history of nicotine dependence: Secondary | ICD-10-CM | POA: Insufficient documentation

## 2017-10-11 DIAGNOSIS — L89100 Pressure ulcer of unspecified part of back, unstageable: Secondary | ICD-10-CM | POA: Diagnosis not present

## 2017-10-11 DIAGNOSIS — Z7984 Long term (current) use of oral hypoglycemic drugs: Secondary | ICD-10-CM | POA: Insufficient documentation

## 2017-10-11 DIAGNOSIS — L89120 Pressure ulcer of left upper back, unstageable: Secondary | ICD-10-CM | POA: Diagnosis not present

## 2017-10-11 DIAGNOSIS — T8189XA Other complications of procedures, not elsewhere classified, initial encounter: Secondary | ICD-10-CM | POA: Diagnosis not present

## 2017-10-11 DIAGNOSIS — E1140 Type 2 diabetes mellitus with diabetic neuropathy, unspecified: Secondary | ICD-10-CM | POA: Diagnosis not present

## 2017-10-11 NOTE — Patient Outreach (Signed)
Joel [name]) Care Management  10/11/2017  Joel [name] May 24, 1932 [id number]   Call placed to member to follow up on current health status and appointments with MDs.  No answer, HIPAA compliant voice message left.  Will follow up within the next 4 business days.  [name], [place], MSN [place] [phone]

## 2017-10-12 ENCOUNTER — Telehealth: Payer: Self-pay | Admitting: Oncology

## 2017-10-12 ENCOUNTER — Inpatient Hospital Stay: Payer: PPO | Attending: Oncology | Admitting: Oncology

## 2017-10-12 VITALS — BP 138/64 | HR 82 | Temp 97.8°F | Resp 18 | Ht 72.0 in | Wt 227.7 lb

## 2017-10-12 DIAGNOSIS — I350 Nonrheumatic aortic (valve) stenosis: Secondary | ICD-10-CM

## 2017-10-12 DIAGNOSIS — K922 Gastrointestinal hemorrhage, unspecified: Secondary | ICD-10-CM | POA: Diagnosis not present

## 2017-10-12 DIAGNOSIS — J449 Chronic obstructive pulmonary disease, unspecified: Secondary | ICD-10-CM | POA: Diagnosis not present

## 2017-10-12 DIAGNOSIS — I4891 Unspecified atrial fibrillation: Secondary | ICD-10-CM | POA: Diagnosis not present

## 2017-10-12 DIAGNOSIS — G629 Polyneuropathy, unspecified: Secondary | ICD-10-CM

## 2017-10-12 DIAGNOSIS — Z87891 Personal history of nicotine dependence: Secondary | ICD-10-CM

## 2017-10-12 DIAGNOSIS — E119 Type 2 diabetes mellitus without complications: Secondary | ICD-10-CM

## 2017-10-12 DIAGNOSIS — I509 Heart failure, unspecified: Secondary | ICD-10-CM | POA: Diagnosis not present

## 2017-10-12 DIAGNOSIS — F319 Bipolar disorder, unspecified: Secondary | ICD-10-CM | POA: Diagnosis not present

## 2017-10-12 DIAGNOSIS — D500 Iron deficiency anemia secondary to blood loss (chronic): Secondary | ICD-10-CM | POA: Diagnosis not present

## 2017-10-12 DIAGNOSIS — C182 Malignant neoplasm of ascending colon: Secondary | ICD-10-CM | POA: Diagnosis not present

## 2017-10-12 DIAGNOSIS — M109 Gout, unspecified: Secondary | ICD-10-CM

## 2017-10-12 DIAGNOSIS — I2510 Atherosclerotic heart disease of native coronary artery without angina pectoris: Secondary | ICD-10-CM | POA: Diagnosis not present

## 2017-10-12 NOTE — Telephone Encounter (Signed)
Scheduled appt per 9/11 los - gave patient AVS and calender per los.   

## 2017-10-12 NOTE — Progress Notes (Signed)
[place] Patient Consult   Requesting MD: He would Asheton Scheffler 84 y.o.  03-04-1932    Reason for Consult: Colon cancer   HPI: Mr. Zentz reports a history of GI bleeding for several years.  He had a colonoscopy in 2014 that showed diverticular disease and an adenoma was removed.  He underwent extensive diagnostic evaluation for GI bleeding including upper endoscopy, and small bowel capsule study.  He was admitted 08/30/2017 with symptomatic anemia.  The hemoglobin returned at 7.3.  He was transfused with packed red blood cells.  Gastroenterology was consulted.  He was taken to a colonoscopy procedure by Dr. Cristina Gong on 09/02/2017.  A nonobstructing mass was found in the cecum.  No bleeding was present.  The mass was biopsied.  A 6 mm polyp was removed from the rectum.  Multiple diverticula in the sigmoid colon.  The pathology revealed adenocarcinoma involving the cecal mass.  The rectal polyp returned as a hyperplastic polyp.  CTs of the chest, abdomen, and pelvis on 09/02/2017 revealed veer aortic valvular calcification, severe three-vessel coronary atherosclerosis.  Emphysema in both lungs.  Moderate right and small left pleural effusions.  Airspace consolidation with air bronchograms in the right lower lobe.  No parenchymal nodules or masses.  Mildly irregular hepatic contour.  A mass was noted at the cecum doubt evidence of obstruction.  No pathologic lymphadenopathy.  Normal prostate with metallic fiducial markers.  Dr. Dalbert Batman was consulted and he was taken to the operating room on 09/05/2017 for a laparoscopic-assisted right colectomy and repair of an umbilical hernia.  A mass was noted in the proximal ascending colon.  No evidence of liver metastases.  Mesenteric lymph nodes "felt "enlarged and were sent for examination.  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  No macroscopic tumor perforation.  Tumor  extended into and focally through the muscularis propria.  Resection margins are negative.  No lymphovascular or perineural invasion.  No tumor deposits.  14 lymph nodes were negative for metastatic carcinoma.  The tumor returned MSI-stable with no loss of mismatch repair protein expression.  He was restarted on Eliquis anticoagulation following surgery.  He developed lower GI bleeding and was taken back to the waiting room on 09/10/2017.  There was mild diffuse using from the anastomosis.  This area was resected..  The pathology from the segmental resection revealed no malignancy and 2 benign lymph nodes.  Mr. Markus denies recurrent bleeding.  The abdominal wound remains open.  The wound is being packed daily.  He is visited by home nurse.  Past Medical History:  Diagnosis Date  . Allergic rhinitis   . Anemia   . Anxiety   . Aortic stenosis 2012   mild to mod   . Atrial fibrillation ([place]) 1991   with multiple DCCV  . Bipolar affective disorder ([place])   . Cancer of ascending colon (HCC)-T3N0 09/05/2017  . CHF (congestive heart failure) ([place]) 08/30/2017  . COPD (chronic obstructive pulmonary disease) ([place])   . Diabetic neuropathy ([place])    in feet  . Diabetic neuropathy ([name])   . Dyslipidemia   . Fatigue    last year or so  . Gout   . Hypertension   . Insomnia   . Obesity   . On home oxygen therapy 2 1/2 liters with bipap at night  . OSA (obstructive sleep apnea)    severe, uses bipap 16 1/2 by 12 or 13 setting  .  Prostate cancer (HCC)-T2a Gleason 4+4, radiation 319 2018-5 12/2016   . Rectal bleeding 12/16/2015  . Type 2 diabetes mellitus ([place])   . Vertigo     Past Surgical History:  Procedure Laterality Date  . BIOPSY  09/02/2017   Procedure: BIOPSY;  Surgeon: Ronald Lobo, MD;  Location: [facility];  Service: Endoscopy;;  . CARDIOVERSION  yrs ago   prior to 1998  . COLON RESECTION N/A 09/05/2017   Procedure: LAPAROSCOPIC RIGHT COLON RESECTION;  Surgeon: Fanny Skates, MD;   Location: [facility];  Service: General;  Laterality: N/A;  . COLONOSCOPY N/A 09/02/2017   Procedure: COLONOSCOPY;  Surgeon: Ronald Lobo, MD;  [facility]: [facility];  Service: Endoscopy;  Laterality: N/A;  . COLONOSCOPY WITH PROPOFOL N/A 01/23/2013   Procedure: COLONOSCOPY WITH PROPOFOL;  Surgeon: Garlan Fair, MD;  [facility]: [facility];  Service: Endoscopy;  Laterality: N/A;  . electro shock  1969   for depression  . ENTEROSCOPY Left 05/17/2016   Procedure: ENTEROSCOPY;  Surgeon: Wilford Corner, MD;  Location: [facility];  Service: Endoscopy;  Laterality: Left;  . ESOPHAGOGASTRODUODENOSCOPY N/A 03/03/2016   Procedure: ESOPHAGOGASTRODUODENOSCOPY (EGD);  Surgeon: Teena Irani, MD;  [facility];  Service: Endoscopy;  Laterality: N/A;  . ESOPHAGOGASTRODUODENOSCOPY N/A 03/26/2016   Procedure: ESOPHAGOGASTRODUODENOSCOPY (EGD);  Surgeon: Teena Irani, MD;  Location[facility] END[facility]COPY;  Service: Endoscopy;  Laterality: N/A;  would like to use ultraslim scope  . ESOPHAGOGASTRODUODENOSCOPY N/A 08/31/2017   Procedure: ESOPHAGOGASTRODUODENOSCOPY (EGD);  Surgeon: Laurence Spates, MD;  Location: [facility];  Service: Endoscopy;  Laterality: N/A;  . ESOPHAGOGASTRODUODENOSCOPY (EGD) WITH PROPOFOL N/A 01/23/2013   Procedure: ESOPHAGOGASTRODUODENOSCOPY (EGD) WITH PROPOFOL;  Surgeon: Garlan Fair, MD;  [facility];  Service: Endoscopy;  Laterality: N/A;  . ESOPHAGOGASTRODUODENOSCOPY (EGD) WITH PROPOFOL N/A 04/13/2016   Procedure: ESOPHAGOGASTRODUODENOSCOPY (EGD) WITH PROPOFOL;  Surgeon: Otis Brace, MD;  Location: [facility];  Service: Gastroenterology;  Laterality: N/A;  . GIVENS CAPSULE STUDY N/A 03/03/2016   Procedure: GIVENS CAPSULE STUDY;  Surgeon: Teena Irani, MD;  [facility];  Service: Endoscopy;  Laterality: N/A;  . HEMORRHOID SURGERY N/A 12/16/2015   Procedure: PROCTOSCOPY WITH CONTROL OF BLEEDING;  Surgeon: Fanny Skates, MD;  Location: [facility];  Service: General;   Laterality: N/A;  . HIP PINNING,CANNULATED Right 03/23/2017   Procedure: CANNULATED HIP PINNING;  Surgeon: Marybelle Killings, MD;  Location: [facility];  Service: Orthopedics;  Laterality: Right;  . KNEE SURGERY  1970's   rt  . LAPAROTOMY N/A 09/10/2017   Procedure: EXPLORATORY LAPAROTOMY, PARTIAL COLECTOMY;  Surgeon: Excell Seltzer, MD;  Location[facility];  Service: General;  Laterality: N/A;  . [place]  . POLYPECTOMY  09/02/2017   Procedure: POLYPECTOMY;  Surgeon: Ronald Lobo, MD;  [facility];  Service: Endoscopy;;  . PROSTATE BIOPSY    . SHOULDER SURGERY  7-8 yrs ago   rt    Medications: Reviewed  Allergies:  Allergies  Allergen Reactions  . Penicillins Anaphylaxis    Has patient had a PCN reaction causing immediate rash, facial/tongue/throat swelling, SOB or lightheadedness with hypotension: unknown Has patient had a PCN reaction causing severe rash involving mucus membranes or skin necrosis: unknown Has patient had a PCN reaction that required hospitalization: unknown Has patient had a PCN reaction occurring within the last 10 years: no If all of the above answers are "NO", then may proceed with Cephalosporin use.   Marland Kitchen Antihistamines, Loratadine-Type Other (See Comments)    depression  . Other  Beta blockers-Novacaine  Caused depression    Family history: No family history of cancer  Social History:   He lives alone in [place].  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  He quit smoking cigarettes at age 44.  He is an alcoholic and quit alcohol at age 57.  He has received multiple red cell transfusions.  No risk factor for HIV or hepatitis.  ROS:   Positives include: 25 pound weight loss following right leg fracture early in 2019-he has gained 12 pounds back.,  Constipation when taking iron, open abdominal wound, skin breakdown at the mid back and right heel  A complete ROS was otherwise negative.  Physical  Exam:  Blood pressure 138/64, pulse 82, temperature 97.8 F (36.6 C), temperature source Oral, resp. rate 18, height 6' (1.829 m), weight 227 lb 11.2 oz (103.3 kg), SpO2 98 %.  HEENT: Upper and lower denture weights, oropharynx without visible mass, neck without mass Lungs: Clear bilaterally, no respiratory distress Cardiac: Irregular, 2/6 systolic murmur Abdomen: No hepatosplenomegaly, no mass, open midline wound with a gauze packing in place GU: Testes without mass, 1 cm nodularity at the left epididymis Vascular: No leg edema Lymph nodes: No cervical, supraclavicular, axillary, or inguinal nodes Neurologic: Alert and oriented, the motor exam appears intact in the upper and lower extremities Skin: Linear area of skin breakdown at the mid back, healing ulcer at the right heel Musculoskeletal: No spine tenderness   LAB:  CBC  Lab Results  Component Value Date   WBC 5.4 09/16/2017   HGB 8.4 (L) 09/16/2017   HCT 26.4 (L) 09/16/2017   MCV 92.2 09/16/2017   PLT 306 09/16/2017   NEUTROABS 4.0 09/16/2017        CMP  Lab Results  Component Value Date   NA 141 09/16/2017   K 3.2 (L) 09/16/2017   CL 104 09/16/2017   CO2 28 09/16/2017   GLUCOSE 148 (H) 09/16/2017   BUN 10 09/16/2017   CREATININE 1.17 09/16/2017   CALCIUM 8.4 (L) 09/16/2017   PROT 6.7 09/06/2017   ALBUMIN 3.4 (L) 09/06/2017   AST 15 09/06/2017   ALT 11 09/06/2017   ALKPHOS 53 09/06/2017   BILITOT 0.7 09/06/2017   GFRNONAA 56 (L) 09/16/2017   GFRAA >60 09/16/2017     Lab Results  Component Value Date   CEA1 4.4 09/02/2017    Imaging: As per HPI, ED images from 09/02/2017 reviewed   Assessment/Plan:   1. Colon cancer-cecum, status post a right colectomy 09/05/2017, stage II (T3N0), 0/14 lymph nodes positive  MSI-stable, no loss of mismatch repair protein expression 09/02/2017  Colonoscopy 09/02/2017-cecal mass with biopsy confirming adenocarcinoma, hyperplastic polyp removed from the rectum  CTs  09/02/2017- cecal mass, bilateral pleural effusions, no evidence of metastatic disease  2. Postoperative bleeding when anticoagulation was resumed, status post a resection of the anastomosis 09/10/2017  3.  Anemia secondary to chronic GI bleeding from #1  4.  Atrial fibrillation  5.  Aortic stenosis  6.  Coronary artery disease  7.   CHF  8.   Diabetes  9.   COPD  10.  Bipolar disorder  11.  Gout  12.  Betty neuropathy   Disposition:   Mr. Jeppsen is been diagnosed with colon cancer.  He underwent a right colectomy 09/05/2017 for a stage II adenocarcinoma of the cecum.  I discussed the details of the surgical pathology report, the prognosis, and adjuvant treatment  options with Mr. Aguiniga today.  He has a good prognosis for a long-term disease-free survival.  The tumor does not have "high risk "features.  I do not recommend adjuvant chemotherapy.  He does not appear to have hereditary non-polyposis colon cancer syndrome, but his family members are at increased risk of developing colorectal cancer and should receive appropriate screening.  He will alert his children.  I do not recommend surveillance imaging.  The pleural effusions noted during the recent hospital admission are likely related to cardiac disease.  He will return for an office visit and CEA in approximately 5 months.  He will discuss the indication for a surveillance colonoscopy with Dr. Cristina Gong.  He will continue follow-up with Dr. Dalbert Batman for wound care.  Betsy Coder, MD  10/12/2017, 2:20 PM

## 2017-10-14 ENCOUNTER — Other Ambulatory Visit: Payer: Self-pay | Admitting: *Deleted

## 2017-10-14 NOTE — Patient Outreach (Signed)
[facility]) Care Management  10/14/2017  Joel [name] May 17, 1932 [id number]   2nd attempt made to contact member to follow up on MD visits, no answer.  HIPAA compliant voice message left.  Unsuccessful outreach letter sent, will follow up within the next 4 business days.  [name], [place], MSN [place] [phone]

## 2017-10-17 DIAGNOSIS — M25551 Pain in right hip: Secondary | ICD-10-CM | POA: Diagnosis not present

## 2017-10-17 DIAGNOSIS — I130 Hypertensive heart and chronic kidney disease with heart failure and stage 1 through stage 4 chronic kidney disease, or unspecified chronic kidney disease: Secondary | ICD-10-CM | POA: Diagnosis not present

## 2017-10-17 DIAGNOSIS — I350 Nonrheumatic aortic (valve) stenosis: Secondary | ICD-10-CM | POA: Diagnosis not present

## 2017-10-17 DIAGNOSIS — E1122 Type 2 diabetes mellitus with diabetic chronic kidney disease: Secondary | ICD-10-CM | POA: Diagnosis not present

## 2017-10-17 DIAGNOSIS — I2510 Atherosclerotic heart disease of native coronary artery without angina pectoris: Secondary | ICD-10-CM | POA: Diagnosis not present

## 2017-10-17 DIAGNOSIS — D631 Anemia in chronic kidney disease: Secondary | ICD-10-CM | POA: Diagnosis not present

## 2017-10-17 DIAGNOSIS — N189 Chronic kidney disease, unspecified: Secondary | ICD-10-CM | POA: Diagnosis not present

## 2017-10-17 DIAGNOSIS — D500 Iron deficiency anemia secondary to blood loss (chronic): Secondary | ICD-10-CM | POA: Diagnosis not present

## 2017-10-17 DIAGNOSIS — Z6833 Body mass index (BMI) 33.0-33.9, adult: Secondary | ICD-10-CM | POA: Diagnosis not present

## 2017-10-17 DIAGNOSIS — J439 Emphysema, unspecified: Secondary | ICD-10-CM | POA: Diagnosis not present

## 2017-10-17 DIAGNOSIS — I4819 Other persistent atrial fibrillation: Secondary | ICD-10-CM | POA: Diagnosis not present

## 2017-10-17 DIAGNOSIS — Z483 Aftercare following surgery for neoplasm: Secondary | ICD-10-CM | POA: Diagnosis not present

## 2017-10-17 DIAGNOSIS — L89152 Pressure ulcer of sacral region, stage 2: Secondary | ICD-10-CM | POA: Diagnosis not present

## 2017-10-17 DIAGNOSIS — E669 Obesity, unspecified: Secondary | ICD-10-CM | POA: Diagnosis not present

## 2017-10-17 DIAGNOSIS — G8929 Other chronic pain: Secondary | ICD-10-CM | POA: Diagnosis not present

## 2017-10-17 DIAGNOSIS — E785 Hyperlipidemia, unspecified: Secondary | ICD-10-CM | POA: Diagnosis not present

## 2017-10-17 DIAGNOSIS — S21209D Unspecified open wound of unspecified back wall of thorax without penetration into thoracic cavity, subsequent encounter: Secondary | ICD-10-CM | POA: Diagnosis not present

## 2017-10-17 DIAGNOSIS — I272 Pulmonary hypertension, unspecified: Secondary | ICD-10-CM | POA: Diagnosis not present

## 2017-10-17 DIAGNOSIS — S72001S Fracture of unspecified part of neck of right femur, sequela: Secondary | ICD-10-CM | POA: Diagnosis not present

## 2017-10-17 DIAGNOSIS — I700 Atherosclerosis of aorta: Secondary | ICD-10-CM | POA: Diagnosis not present

## 2017-10-17 DIAGNOSIS — I509 Heart failure, unspecified: Secondary | ICD-10-CM | POA: Diagnosis not present

## 2017-10-17 DIAGNOSIS — K922 Gastrointestinal hemorrhage, unspecified: Secondary | ICD-10-CM | POA: Diagnosis not present

## 2017-10-17 DIAGNOSIS — L89610 Pressure ulcer of right heel, unstageable: Secondary | ICD-10-CM | POA: Diagnosis not present

## 2017-10-17 DIAGNOSIS — F419 Anxiety disorder, unspecified: Secondary | ICD-10-CM | POA: Diagnosis not present

## 2017-10-17 DIAGNOSIS — F319 Bipolar disorder, unspecified: Secondary | ICD-10-CM | POA: Diagnosis not present

## 2017-10-19 ENCOUNTER — Other Ambulatory Visit: Payer: Self-pay | Admitting: *Deleted

## 2017-10-19 NOTE — Patient Outreach (Addendum)
Joel [name]) Care Management  10/19/2017  Joel [name] December 06, 1932 [id number]   Call placed to member to follow up on current health condition, no answer.  HIPAA compliant voice message left.  Call also placed to member's daughter, unsuccessful, HIPAA compliant message left.  This is the 3rd consecutive unsuccessful outreach.  Will await call back, if no call back by 9/24 will close case due to inability to maintain contact.   Call received back from daughter, state member is doing well but does not know how to check his messages.  She will call him and have him call this care manager back.     Update @ 1700:  No call received back from member or daughter.  Will follow up within the next week.  If remain unable to contact will close case.  Joel [name], [place], MSN [place] [phone]

## 2017-10-20 DIAGNOSIS — L97418 Non-pressure chronic ulcer of right heel and midfoot with other specified severity: Secondary | ICD-10-CM | POA: Diagnosis not present

## 2017-10-20 DIAGNOSIS — L89612 Pressure ulcer of right heel, stage 2: Secondary | ICD-10-CM | POA: Diagnosis not present

## 2017-10-20 DIAGNOSIS — E11621 Type 2 diabetes mellitus with foot ulcer: Secondary | ICD-10-CM | POA: Diagnosis not present

## 2017-10-20 DIAGNOSIS — T8131XA Disruption of external operation (surgical) wound, not elsewhere classified, initial encounter: Secondary | ICD-10-CM | POA: Diagnosis not present

## 2017-10-20 DIAGNOSIS — L89142 Pressure ulcer of left lower back, stage 2: Secondary | ICD-10-CM | POA: Diagnosis not present

## 2017-10-26 ENCOUNTER — Other Ambulatory Visit: Payer: Self-pay | Admitting: *Deleted

## 2017-10-26 DIAGNOSIS — C189 Malignant neoplasm of colon, unspecified: Secondary | ICD-10-CM | POA: Diagnosis not present

## 2017-10-26 DIAGNOSIS — Z9181 History of falling: Secondary | ICD-10-CM | POA: Diagnosis not present

## 2017-10-26 DIAGNOSIS — E662 Morbid (severe) obesity with alveolar hypoventilation: Secondary | ICD-10-CM | POA: Diagnosis not present

## 2017-10-26 DIAGNOSIS — J449 Chronic obstructive pulmonary disease, unspecified: Secondary | ICD-10-CM | POA: Diagnosis not present

## 2017-10-26 DIAGNOSIS — J410 Simple chronic bronchitis: Secondary | ICD-10-CM | POA: Diagnosis not present

## 2017-10-26 DIAGNOSIS — M6281 Muscle weakness (generalized): Secondary | ICD-10-CM | POA: Diagnosis not present

## 2017-10-26 DIAGNOSIS — S72011D Unspecified intracapsular fracture of right femur, subsequent encounter for closed fracture with routine healing: Secondary | ICD-10-CM | POA: Diagnosis not present

## 2017-10-26 DIAGNOSIS — G4733 Obstructive sleep apnea (adult) (pediatric): Secondary | ICD-10-CM | POA: Diagnosis not present

## 2017-10-26 NOTE — Patient Outreach (Signed)
[name] Care Management  10/26/2017  Joel [name] 06-07-1932 [id number]   Call placed to member to follow up on current health status as this care manager has been unable to contact him for the past couple weeks.  He report he is doing well, still have the open abdominal wound.  State he has nurse visiting the home weekly and is seen in the wound clinic weekly for dressing changes however it should be changed daily.  He is having trouble changing it himself.  This care manager inquired about having his daughter assist with daily changes.  State she is busy and does not like to bother her.  He report PT services are complete, but he feel he need more sessions.  Admits that MD office has discussed level of care with him and the need for additional services in the home.  He is unsure if he will be able to pay out of pocket for services, adamantly refuses to be placed back in facility.  Will place referral for social worker to address additional support in the home.  He has upcoming appointments this week with the wound clinic, primary MD, and surgeon, friend will provide transportation.  Denies any urgent concerns, agrees to home visit next month.   THN CM Care Plan Problem One     Most Recent Value  Care Plan Problem One  Knowledge deficit related to new diagnosis of colon cancer as evidenced by recent hospital admission  Role Documenting the Problem One  Care Management [place] for Problem One  Active  [facility] Long Term Goal   Member will not be readmitted back to hospital within 31 days of discharge  [place] Term Goal Start Date  09/28/17  [facility] Long Term Goal Met Date  10/26/17  [facility] CM Short Term Goal #1   Member will have follow up appointment with surgeon within the next 2 weeks  THN CM Short Term Goal #1 Start Date  09/28/17  [facility] CM Short Term Goal #1 Met Date  10/26/17  THN CM Short Term Goal #2   Member will report compliance with medications as prescribed  over the next 4 weeks  THN CM Short Term Goal #2 Start Date  09/28/17  Interventions for Short Term Goal #2  Reviewed medications with member again, will review in person during next visit      [name], RN, MSN [place] Manager [phone]

## 2017-10-27 DIAGNOSIS — L89612 Pressure ulcer of right heel, stage 2: Secondary | ICD-10-CM | POA: Diagnosis not present

## 2017-10-27 DIAGNOSIS — D649 Anemia, unspecified: Secondary | ICD-10-CM | POA: Diagnosis not present

## 2017-10-27 DIAGNOSIS — I482 Chronic atrial fibrillation: Secondary | ICD-10-CM | POA: Diagnosis not present

## 2017-10-27 DIAGNOSIS — N183 Chronic kidney disease, stage 3 (moderate): Secondary | ICD-10-CM | POA: Diagnosis not present

## 2017-10-27 DIAGNOSIS — L89142 Pressure ulcer of left lower back, stage 2: Secondary | ICD-10-CM | POA: Diagnosis not present

## 2017-10-27 DIAGNOSIS — I10 Essential (primary) hypertension: Secondary | ICD-10-CM | POA: Diagnosis not present

## 2017-10-27 DIAGNOSIS — E1142 Type 2 diabetes mellitus with diabetic polyneuropathy: Secondary | ICD-10-CM | POA: Diagnosis not present

## 2017-10-27 DIAGNOSIS — I509 Heart failure, unspecified: Secondary | ICD-10-CM | POA: Diagnosis not present

## 2017-10-27 DIAGNOSIS — J449 Chronic obstructive pulmonary disease, unspecified: Secondary | ICD-10-CM | POA: Diagnosis not present

## 2017-10-27 DIAGNOSIS — C189 Malignant neoplasm of colon, unspecified: Secondary | ICD-10-CM | POA: Diagnosis not present

## 2017-11-01 DIAGNOSIS — E669 Obesity, unspecified: Secondary | ICD-10-CM | POA: Diagnosis not present

## 2017-11-01 DIAGNOSIS — I2510 Atherosclerotic heart disease of native coronary artery without angina pectoris: Secondary | ICD-10-CM | POA: Diagnosis not present

## 2017-11-01 DIAGNOSIS — Z483 Aftercare following surgery for neoplasm: Secondary | ICD-10-CM | POA: Diagnosis not present

## 2017-11-01 DIAGNOSIS — K922 Gastrointestinal hemorrhage, unspecified: Secondary | ICD-10-CM | POA: Diagnosis not present

## 2017-11-01 DIAGNOSIS — Z6833 Body mass index (BMI) 33.0-33.9, adult: Secondary | ICD-10-CM | POA: Diagnosis not present

## 2017-11-01 DIAGNOSIS — D500 Iron deficiency anemia secondary to blood loss (chronic): Secondary | ICD-10-CM | POA: Diagnosis not present

## 2017-11-01 DIAGNOSIS — I700 Atherosclerosis of aorta: Secondary | ICD-10-CM | POA: Diagnosis not present

## 2017-11-03 ENCOUNTER — Encounter (HOSPITAL_BASED_OUTPATIENT_CLINIC_OR_DEPARTMENT_OTHER): Payer: PPO | Attending: Internal Medicine

## 2017-11-03 DIAGNOSIS — L89122 Pressure ulcer of left upper back, stage 2: Secondary | ICD-10-CM | POA: Diagnosis not present

## 2017-11-03 DIAGNOSIS — Z923 Personal history of irradiation: Secondary | ICD-10-CM | POA: Diagnosis not present

## 2017-11-03 DIAGNOSIS — J449 Chronic obstructive pulmonary disease, unspecified: Secondary | ICD-10-CM | POA: Diagnosis not present

## 2017-11-03 DIAGNOSIS — I10 Essential (primary) hypertension: Secondary | ICD-10-CM | POA: Insufficient documentation

## 2017-11-03 DIAGNOSIS — T8189XA Other complications of procedures, not elsewhere classified, initial encounter: Secondary | ICD-10-CM | POA: Insufficient documentation

## 2017-11-03 DIAGNOSIS — L89102 Pressure ulcer of unspecified part of back, stage 2: Secondary | ICD-10-CM | POA: Diagnosis not present

## 2017-11-03 DIAGNOSIS — Y838 Other surgical procedures as the cause of abnormal reaction of the patient, or of later complication, without mention of misadventure at the time of the procedure: Secondary | ICD-10-CM | POA: Insufficient documentation

## 2017-11-03 DIAGNOSIS — E119 Type 2 diabetes mellitus without complications: Secondary | ICD-10-CM | POA: Insufficient documentation

## 2017-11-03 DIAGNOSIS — T8131XA Disruption of external operation (surgical) wound, not elsewhere classified, initial encounter: Secondary | ICD-10-CM | POA: Diagnosis not present

## 2017-11-11 DIAGNOSIS — N189 Chronic kidney disease, unspecified: Secondary | ICD-10-CM | POA: Diagnosis not present

## 2017-11-11 DIAGNOSIS — E669 Obesity, unspecified: Secondary | ICD-10-CM | POA: Diagnosis not present

## 2017-11-11 DIAGNOSIS — D631 Anemia in chronic kidney disease: Secondary | ICD-10-CM | POA: Diagnosis not present

## 2017-11-11 DIAGNOSIS — K922 Gastrointestinal hemorrhage, unspecified: Secondary | ICD-10-CM | POA: Diagnosis not present

## 2017-11-11 DIAGNOSIS — Z483 Aftercare following surgery for neoplasm: Secondary | ICD-10-CM | POA: Diagnosis not present

## 2017-11-11 DIAGNOSIS — I509 Heart failure, unspecified: Secondary | ICD-10-CM | POA: Diagnosis not present

## 2017-11-11 DIAGNOSIS — G8929 Other chronic pain: Secondary | ICD-10-CM | POA: Diagnosis not present

## 2017-11-11 DIAGNOSIS — E785 Hyperlipidemia, unspecified: Secondary | ICD-10-CM | POA: Diagnosis not present

## 2017-11-11 DIAGNOSIS — I4819 Other persistent atrial fibrillation: Secondary | ICD-10-CM | POA: Diagnosis not present

## 2017-11-11 DIAGNOSIS — D500 Iron deficiency anemia secondary to blood loss (chronic): Secondary | ICD-10-CM | POA: Diagnosis not present

## 2017-11-11 DIAGNOSIS — I700 Atherosclerosis of aorta: Secondary | ICD-10-CM | POA: Diagnosis not present

## 2017-11-11 DIAGNOSIS — E1122 Type 2 diabetes mellitus with diabetic chronic kidney disease: Secondary | ICD-10-CM | POA: Diagnosis not present

## 2017-11-11 DIAGNOSIS — I272 Pulmonary hypertension, unspecified: Secondary | ICD-10-CM | POA: Diagnosis not present

## 2017-11-11 DIAGNOSIS — S72001S Fracture of unspecified part of neck of right femur, sequela: Secondary | ICD-10-CM | POA: Diagnosis not present

## 2017-11-11 DIAGNOSIS — I350 Nonrheumatic aortic (valve) stenosis: Secondary | ICD-10-CM | POA: Diagnosis not present

## 2017-11-11 DIAGNOSIS — J439 Emphysema, unspecified: Secondary | ICD-10-CM | POA: Diagnosis not present

## 2017-11-11 DIAGNOSIS — L89152 Pressure ulcer of sacral region, stage 2: Secondary | ICD-10-CM | POA: Diagnosis not present

## 2017-11-11 DIAGNOSIS — Z6833 Body mass index (BMI) 33.0-33.9, adult: Secondary | ICD-10-CM | POA: Diagnosis not present

## 2017-11-11 DIAGNOSIS — I2510 Atherosclerotic heart disease of native coronary artery without angina pectoris: Secondary | ICD-10-CM | POA: Diagnosis not present

## 2017-11-11 DIAGNOSIS — I130 Hypertensive heart and chronic kidney disease with heart failure and stage 1 through stage 4 chronic kidney disease, or unspecified chronic kidney disease: Secondary | ICD-10-CM | POA: Diagnosis not present

## 2017-11-11 DIAGNOSIS — F419 Anxiety disorder, unspecified: Secondary | ICD-10-CM | POA: Diagnosis not present

## 2017-11-11 DIAGNOSIS — F319 Bipolar disorder, unspecified: Secondary | ICD-10-CM | POA: Diagnosis not present

## 2017-11-11 DIAGNOSIS — M25551 Pain in right hip: Secondary | ICD-10-CM | POA: Diagnosis not present

## 2017-11-11 DIAGNOSIS — L89610 Pressure ulcer of right heel, unstageable: Secondary | ICD-10-CM | POA: Diagnosis not present

## 2017-11-11 DIAGNOSIS — S21209D Unspecified open wound of unspecified back wall of thorax without penetration into thoracic cavity, subsequent encounter: Secondary | ICD-10-CM | POA: Diagnosis not present

## 2017-11-14 DIAGNOSIS — I700 Atherosclerosis of aorta: Secondary | ICD-10-CM | POA: Diagnosis not present

## 2017-11-14 DIAGNOSIS — S21209D Unspecified open wound of unspecified back wall of thorax without penetration into thoracic cavity, subsequent encounter: Secondary | ICD-10-CM | POA: Diagnosis not present

## 2017-11-14 DIAGNOSIS — I350 Nonrheumatic aortic (valve) stenosis: Secondary | ICD-10-CM | POA: Diagnosis not present

## 2017-11-14 DIAGNOSIS — Z483 Aftercare following surgery for neoplasm: Secondary | ICD-10-CM | POA: Diagnosis not present

## 2017-11-14 DIAGNOSIS — N189 Chronic kidney disease, unspecified: Secondary | ICD-10-CM | POA: Diagnosis not present

## 2017-11-14 DIAGNOSIS — D500 Iron deficiency anemia secondary to blood loss (chronic): Secondary | ICD-10-CM | POA: Diagnosis not present

## 2017-11-14 DIAGNOSIS — I4819 Other persistent atrial fibrillation: Secondary | ICD-10-CM | POA: Diagnosis not present

## 2017-11-14 DIAGNOSIS — S72001S Fracture of unspecified part of neck of right femur, sequela: Secondary | ICD-10-CM | POA: Diagnosis not present

## 2017-11-14 DIAGNOSIS — I272 Pulmonary hypertension, unspecified: Secondary | ICD-10-CM | POA: Diagnosis not present

## 2017-11-14 DIAGNOSIS — L89152 Pressure ulcer of sacral region, stage 2: Secondary | ICD-10-CM | POA: Diagnosis not present

## 2017-11-14 DIAGNOSIS — F419 Anxiety disorder, unspecified: Secondary | ICD-10-CM | POA: Diagnosis not present

## 2017-11-14 DIAGNOSIS — E785 Hyperlipidemia, unspecified: Secondary | ICD-10-CM | POA: Diagnosis not present

## 2017-11-14 DIAGNOSIS — Z6833 Body mass index (BMI) 33.0-33.9, adult: Secondary | ICD-10-CM | POA: Diagnosis not present

## 2017-11-14 DIAGNOSIS — E1122 Type 2 diabetes mellitus with diabetic chronic kidney disease: Secondary | ICD-10-CM | POA: Diagnosis not present

## 2017-11-14 DIAGNOSIS — I2510 Atherosclerotic heart disease of native coronary artery without angina pectoris: Secondary | ICD-10-CM | POA: Diagnosis not present

## 2017-11-14 DIAGNOSIS — K922 Gastrointestinal hemorrhage, unspecified: Secondary | ICD-10-CM | POA: Diagnosis not present

## 2017-11-14 DIAGNOSIS — L89610 Pressure ulcer of right heel, unstageable: Secondary | ICD-10-CM | POA: Diagnosis not present

## 2017-11-14 DIAGNOSIS — G8929 Other chronic pain: Secondary | ICD-10-CM | POA: Diagnosis not present

## 2017-11-14 DIAGNOSIS — I509 Heart failure, unspecified: Secondary | ICD-10-CM | POA: Diagnosis not present

## 2017-11-14 DIAGNOSIS — F319 Bipolar disorder, unspecified: Secondary | ICD-10-CM | POA: Diagnosis not present

## 2017-11-14 DIAGNOSIS — M25551 Pain in right hip: Secondary | ICD-10-CM | POA: Diagnosis not present

## 2017-11-14 DIAGNOSIS — D631 Anemia in chronic kidney disease: Secondary | ICD-10-CM | POA: Diagnosis not present

## 2017-11-14 DIAGNOSIS — I130 Hypertensive heart and chronic kidney disease with heart failure and stage 1 through stage 4 chronic kidney disease, or unspecified chronic kidney disease: Secondary | ICD-10-CM | POA: Diagnosis not present

## 2017-11-14 DIAGNOSIS — E669 Obesity, unspecified: Secondary | ICD-10-CM | POA: Diagnosis not present

## 2017-11-14 DIAGNOSIS — J439 Emphysema, unspecified: Secondary | ICD-10-CM | POA: Diagnosis not present

## 2017-11-16 ENCOUNTER — Other Ambulatory Visit: Payer: Self-pay | Admitting: *Deleted

## 2017-11-16 NOTE — Patient Outreach (Signed)
[facility]) Care Management   11/16/2017  ZAVIAN SLOWEY [birth year] [id number]  LEIBISH MCGREGOR is an 84 y.o. male  Subjective:   Member alert and oriented x3, denies any pain or discomfort.  Report he is going to wound clinic every other week, state home health is changing dressing once a week.  State the dressing is to be changed every other day, however has not been changed in 3-5 days.  Has appointment with wound clinic tomorrow.  Objective:   Review of Systems  Constitutional: Negative.   HENT: Negative.   Eyes: Negative.   Respiratory: Negative.   Cardiovascular: Negative.   Gastrointestinal: Negative.   Genitourinary: Negative.   Musculoskeletal: Negative.   Skin:       Abdominal wound  Neurological: Negative.   Endo/Heme/Allergies: Negative.   Psychiatric/Behavioral: Negative.     Physical Exam  Constitutional: He is oriented to person, place, and time. He appears well-developed and well-nourished.  Neck: Normal range of motion.  Cardiovascular: Normal rate and normal heart sounds.  Respiratory: Effort normal and breath sounds normal.  GI: Soft. Bowel sounds are normal.  Musculoskeletal: Normal range of motion.  Neurological: He is alert and oriented to person, place, and time.  Skin: Skin is warm and dry.   BP 138/72 (BP Location: Right Arm, Patient Position: Sitting, Cuff Size: Normal)   Pulse 79   Resp 18   SpO2 96%   Encounter Medications:   Outpatient Encounter Medications as of 11/16/2017  Medication Sig  . allopurinol (ZYLOPRIM) 100 MG tablet Take 100 mg by mouth daily.   . Ascorbic Acid (VITAMIN C PO) Take 1 tablet by mouth daily.  . B Complex-C (B-COMPLEX WITH VITAMIN C) tablet Take 1 tablet by mouth daily.  Marland Kitchen buPROPion (WELLBUTRIN XL) 150 MG 24 hr tablet Take 1 tablet (150 mg total) by mouth daily.  . cholecalciferol (VITAMIN D) 1000 units tablet Take 1,000 Units by mouth daily.  . citalopram (CELEXA) 20 MG tablet Take 1 tablet  (20 mg total) by mouth daily.  . Cyanocobalamin (VITAMIN B-12 PO) Take 1 tablet by mouth daily.  Marland Kitchen diltiazem (CARDIZEM CD) 120 MG 24 hr capsule Take 1 capsule (120 mg total) by mouth daily.  . feeding supplement (ENSURE SURGERY) LIQD Take 237 mLs by mouth 2 (two) times daily between meals.  . fluticasone (FLONASE) 50 MCG/ACT nasal spray Place 2 sprays into both nostrils daily. (Patient taking differently: Place 2 sprays into both nostrils daily as needed for allergies or rhinitis. )  . furosemide (LASIX) 80 MG tablet Take 1 tablet (80 mg total) by mouth daily.  Marland Kitchen glimepiride (AMARYL) 1 MG tablet Take 1 mg by mouth daily with breakfast.   . iron polysaccharides (NIFEREX) 150 MG capsule Take 1 capsule (150 mg total) by mouth 2 (two) times daily.  Marland Kitchen lovastatin (MEVACOR) 20 MG tablet Take 20 mg by mouth at bedtime.   . meclizine (ANTIVERT) 25 MG tablet Take 1 tablet (25 mg total) by mouth 3 (three) times daily as needed for dizziness. (Patient taking differently: Take 25 mg by mouth 2 (two) times daily. )  . Multiple Vitamins-Minerals (VISION FORMULA PO) Take 1 tablet by mouth 2 (two) times daily.  . OXYGEN Inhale 2 L into the lungs as needed (shortness of breath).   . potassium chloride SA (K-DUR,KLOR-CON) 20 MEQ tablet Take 20 mEq by mouth daily.   . temazepam (RESTORIL) 15 MG capsule Take 1 capsule (15 mg total) by mouth at bedtime.  Marland Kitchen  cephALEXin (KEFLEX) 500 MG capsule TK 1 C PO BID FOR 10 DAYS  . gabapentin (NEURONTIN) 100 MG capsule Take 2 capsules (200 mg total) by mouth 2 (two) times daily. (Patient not taking: Reported on 11/16/2017)  . guaiFENesin (MUCINEX) 600 MG 12 hr tablet Take 2 tablets (1,200 mg total) by mouth 2 (two) times daily. (Patient not taking: Reported on 11/16/2017)  . metFORMIN (GLUCOPHAGE) 1000 MG tablet Take 1,000 mg by mouth 2 (two) times daily.   . pantoprazole (PROTONIX) 40 MG tablet Take 1 tablet (40 mg total) by mouth 2 (two) times daily. (Patient not taking: Reported  on 11/16/2017)  . senna-docusate (SENOKOT-S) 8.6-50 MG tablet Take 2 tablets by mouth 2 (two) times daily. (Patient not taking: Reported on 11/16/2017)   No facility-administered encounter medications on file as of 11/16/2017.     Functional Status:   In your present state of health, do you have any difficulty performing the following activities: 09/28/2017 08/30/2017  Hearing? [place]? N -  Difficulty concentrating or making decisions? N -  Walking or climbing stairs? Y -  Dressing or bathing? N -  Doing errands, shopping? [place]  Preparing Food and eating ? N -  Using the Toilet? N -  In the past six months, have you accidently leaked urine? N -  Do you have problems with loss of bowel control? N -  Managing your Medications? N -  Managing your Finances? N -  Housekeeping or managing your Housekeeping? Y -  Some recent data might be hidden    Fall/Depression Screening:    Fall Risk  09/28/2017 03/29/2017 07/14/2016  Falls in the past year? Yes Yes No  Number falls in past yr: 1 1 -  Injury with Fall? Yes Yes -  Risk Factor Category  High Fall Risk - -  Risk for fall due to : History of fall(s) Impaired balance/gait;Impaired mobility -  Follow up Education provided Education provided;Follow up appointment;Falls prevention discussed -   PHQ 2/9 Scores 09/28/2017 03/29/2017 07/14/2016 03/30/2016 01/07/2016 01/07/2016  PHQ - 2 Score 1 0 0 0 0 0    Assessment:    Met with member at scheduled time.  He is in wheelchair, state his time for PT has run out.  Report he was able to move around the home with his walker just a couple weeks ago but has gotten weaker.  Per member, [facility] is in the process of requesting approval from insurance company for more sessions.  Abdominal dressing, wound closing slowly from the top of incision, remains open with yellow/brownish drainage.  He verbalizes understanding of importance of changing dressing every other day as instructed in effort to  decrease risk of infection and promote healing.  Attempted to review medications and assist with managing pill box, however difficult to do.  He has multiple pill bottles in his office, unable to determine his system, which medications he is taking and if he is taking them appropriately.  Was initially reluctant to pharmacist assistance, but agrees. Although he uses pill box, is open to pill packaging.  This care manager expressed concern regarding level of care needed and support system.  He state his MD has voice the same concerns however he is not willing to consider any type of placement, including assisted living.  Does not have Medicaid, unable to pay out of pocket for in home aide services.  Has some one to come in every so often to clean the home  and has friend to take him to appointments.    Denies any urgent concerns at this time.  Advised to contact this care manager with questions.  Plan:   Will place referral to [facility] pharmacist for medication review and management. Will follow up with member within the next month.  [facility] CM Care Plan Problem One     Most Recent Value  Care Plan Problem One  Knowledge deficit related to new diagnosis of colon cancer as evidenced by recent hospital admission  Role Documenting the Problem One  Care Management [place] for Problem One  Active  [facility] CM Short Term Goal #2   Member will report compliance with medications as prescribed over the next 4 weeks  [facility] CM Short Term Goal #2 Start Date  11/16/17 [Goal not met, date reset]  Interventions for Short Term Goal #2  Referral placed to [facility] pharmacist medication management    [facility] CM Care Plan Problem Two     Most Recent Value  Care Plan Problem Two  Knowledge deficit related to diagnosis of colon cancer  Role Documenting the Problem Two  Care Management Coordinator  Care Plan for Problem Two  Active  Interventions for Problem Two Long Term Goal   Member educated on importance of compliance  with appointments with wound clinic and cooperation with home health in effort to promote wound healing.  [place] Term Goal  Member will report continued wound healing over the next 6 weeks  [facility] Long Term Goal Start Date  11/16/17  [facility] CM Short Term Goal #1   Member will not report any signs/symptoms of infection within the next 4 weeks  [facility] CM Short Term Goal #1 Start Date  11/16/17  Interventions for Short Term Goal #2   Member educated on importance of proper hygiene when changing dressings.  Advised of importance of changing as instructed. Educated on signs/symptoms of infections      [name], Therapist, sports, MSN [place] [phone]
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
## 2017-11-17 ENCOUNTER — Ambulatory Visit: Payer: PPO | Admitting: Cardiology

## 2017-11-17 DIAGNOSIS — L89122 Pressure ulcer of left upper back, stage 2: Secondary | ICD-10-CM | POA: Diagnosis not present

## 2017-11-17 DIAGNOSIS — T8131XA Disruption of external operation (surgical) wound, not elsewhere classified, initial encounter: Secondary | ICD-10-CM | POA: Diagnosis not present

## 2017-11-17 DIAGNOSIS — T8189XA Other complications of procedures, not elsewhere classified, initial encounter: Secondary | ICD-10-CM | POA: Diagnosis not present

## 2017-11-18 ENCOUNTER — Encounter: Payer: Self-pay | Admitting: Cardiology

## 2017-11-18 ENCOUNTER — Other Ambulatory Visit: Payer: Self-pay

## 2017-11-18 NOTE — Patient Outreach (Signed)
[facility]) Care Management  [place]  11/18/2017  Joel [name] [birth year] [id number]   Reason for referral: medication assistance  Unsuccessful telephone call attempt #1 to patient.   HIPAA compliant voicemail left requesting a return call  Plan:  I will make another outreach attempt to patient within 3-4 business days  Joetta Manners, [place] [phone]

## 2017-11-18 NOTE — Patient Outreach (Deleted)
[place] [facility]) Care Management  [place]  11/18/2017  JANCARLOS THRUN 03/27/1932 [id number]   Reason for referral: ***  Unsuccessful telephone call attempt #*** to patient.   {Unsuccessful outreach call outcomes:21933}  Plan:  {Unsuccessful outreach call plans:21934}  [place] [facility]) Care Management  11/18/2017  JAYMARION TROMBLY 14-Feb-1932 [id number]    [place] [facility]) Care Management  [place]   11/18/2017  TU BAYLE 11-Jul-1932 [id number]  Reason for referral: ***  Referral source: *** Referral medication(s): *** Current insurance:***  PMHx: ***   HPI: ***   Objective: Allergies  Allergen Reactions  . Penicillins Anaphylaxis    Has patient had a PCN reaction causing immediate rash, facial/tongue/throat swelling, SOB or lightheadedness with hypotension: unknown Has patient had a PCN reaction causing severe rash involving mucus membranes or skin necrosis: unknown Has patient had a PCN reaction that required hospitalization: unknown Has patient had a PCN reaction occurring within the last 10 years: no If all of the above answers are "NO", then may proceed with Cephalosporin use.   Marland Kitchen Antihistamines, Loratadine-Type Other (See Comments)    depression  . Other     Beta blockers-Novacaine  Caused depression    Medications Reviewed Today    Reviewed by [name], RN (Registered Nurse) on 11/16/17 at 1123  Med List Status: <None>  Medication Order Taking? Sig Documenting Provider Last Dose Status Informant  allopurinol (ZYLOPRIM) 100 MG tablet [id number] Yes Take 100 mg by mouth daily.  [provider] Taking Active Self  Ascorbic Acid (VITAMIN C PO) [id number] Yes Take 1 tablet by mouth daily. [provider] Taking Active Self  B Complex-C (B-COMPLEX WITH VITAMIN C) tablet [id number] Yes Take 1 tablet by mouth daily. Lavina Hamman, MD Taking Active   buPROPion (WELLBUTRIN  XL) 150 MG 24 hr tablet [id number] Yes Take 1 tablet (150 mg total) by mouth daily. Ambrose Finland, MD Taking Active Self  cephALEXin (KEFLEX) 500 MG capsule [id number] No TK 1 C PO BID FOR 10 DAYS [provider] Not Taking Active   cholecalciferol (VITAMIN D) 1000 units tablet [id number] Yes Take 1,000 Units by mouth daily. [provider] Taking Active Self  citalopram (CELEXA) 20 MG tablet [id number] Yes Take 1 tablet (20 mg total) by mouth daily. Lavina Hamman, MD Taking Active   Cyanocobalamin (VITAMIN B-12 PO) [id number] Yes Take 1 tablet by mouth daily. [provider] Taking Active Self  diltiazem (CARDIZEM CD) 120 MG 24 hr capsule [id number] Yes Take 1 capsule (120 mg total) by mouth daily. Lavina Hamman, MD Taking Active   feeding supplement [facility]) Yehuda Budd [id number] Yes Take 237 mLs by mouth 2 (two) times daily between meals. Lavina Hamman, MD Taking Active   fluticasone [facility]) 50 MCG/ACT nasal spray [id number] Yes Place 2 sprays into both nostrils daily.  Patient taking differently:  Place 2 sprays into both nostrils daily as needed for allergies or rhinitis.    Waynetta Pean, PA-C Taking Active Self  furosemide (LASIX) 80 MG tablet [id number] Yes Take 1 tablet (80 mg total) by mouth daily. Lavina Hamman, MD Taking Active   gabapentin (NEURONTIN) 100 MG capsule [id number] No Take 2 capsules (200 mg total) by mouth 2 (two) times daily.  Patient not taking:  Reported on 11/16/2017   Lavina Hamman, MD Not Taking Active   glimepiride (AMARYL) 1 MG tablet [id number] Yes Take 1 mg by mouth daily with breakfast.  [provider] Taking Active Self  guaiFENesin (MUCINEX) 600 MG 12 hr tablet [id number] No Take 2 tablets (1,200 mg total) by mouth 2 (two) times daily.  Patient not taking:  Reported on 11/16/2017   Lavina Hamman, MD Not Taking Active   iron polysaccharides (NIFEREX) 150 MG capsule [id number] Yes Take 1 capsule (150 mg total)  by mouth 2 (two) times daily. Eugenie Filler, MD Taking Active Self  lovastatin (MEVACOR) 20 MG tablet [id number] Yes Take 20 mg by mouth at bedtime.  [provider] Taking Active Self  meclizine (ANTIVERT) 25 MG tablet [id number] Yes Take 1 tablet (25 mg total) by mouth 3 (three) times daily as needed for dizziness.  Patient taking differently:  Take 25 mg by mouth 2 (two) times daily.    Eber Jones, MD Taking Active Self  metFORMIN (GLUCOPHAGE) 1000 MG tablet [id number] No Take 1,000 mg by mouth 2 (two) times daily.  [provider] Not Taking Active Self           Med Note Geroge Baseman   Tue Oct 22, 2014  7:29 PM)     Multiple Vitamins-Minerals (VISION FORMULA PO) [id number] Yes Take 1 tablet by mouth 2 (two) times daily. [provider] Taking Active Self  OXYGEN [id number] Yes Inhale 2 L into the lungs as needed (shortness of breath).  [provider] Taking Active Self  pantoprazole (PROTONIX) 40 MG tablet [id number] No Take 1 tablet (40 mg total) by mouth 2 (two) times daily.  Patient not taking:  Reported on 11/16/2017   Lavina Hamman, MD Not Taking Active   potassium chloride SA (K-DUR,KLOR-CON) 20 MEQ tablet [id number] Yes Take 20 mEq by mouth daily.  [provider] Taking Active Self           Med Note Wyatt Portela, ANAIS   Tue Oct 22, 2014  7:28 PM)     senna-docusate (SENOKOT-S) 8.6-50 MG tablet [id number] No Take 2 tablets by mouth 2 (two) times daily.  Patient not taking:  Reported on 11/16/2017   Roxan Hockey, MD Not Taking Active Self  temazepam (RESTORIL) 15 MG capsule [id number] Yes Take 1 capsule (15 mg total) by mouth at bedtime. Roxan Hockey, MD Taking Active Self          Assessment:  Drugs sorted by system:  Neurologic/Psychologic:  Cardiovascular:  Pulmonary/Allergy:  Gastrointestinal:  Endocrine:  Renal:  Topical:  Pain:  Infectious  Diseases:  Oncology:  Genitourinary:  Vitamins/Minerals/Supplements:  Miscellaneous:  Medication Review Findings:  . *** . *** . ***  Medication Assistance Findings:  Extra Help:   []  Already receiving Full Extra Help  []  Already receiving Partial Extra Help  []  Eligible based on reported income and assets  []  Not Eligible based on reported income and assets  Patient Assistance Programs: 1) *** made by *** o Income requirement met: []  Yes []  No []  Unknown o Out-of-pocket prescription expenditure met:    []  Yes []  No  []  Unknown  []  Not applicable - ***        2)  *** made by *** o Income requirement met: []  Yes []  No  []  Unknown o Out-of-pocket prescription expenditure met:   []  Yes []  No   []  Unknown []  Not applicable - ***   Additional medication assistance options reviewed with patient as warranted:  {Additional mediction assistance options:21932}  Plan: I will route patient assistance letter to [place] technician who will coordinate patient assistance program application  process for medications listed above.  [facility] pharmacy technician will assist with obtaining all required documents from both patient and provider(s) and submit application(s) once completed.         [place] [facility]) Care Management  [employer]   11/18/2017  TRENTON PASSOW 04-15-1932 [id number]  Reason for referral: ***  Referral source: *** Referral medication(s): *** Current insurance:***  PMHx: ***   HPI: ***   Objective: Allergies  Allergen Reactions  . Penicillins Anaphylaxis    Has patient had a PCN reaction causing immediate rash, facial/tongue/throat swelling, SOB or lightheadedness with hypotension: unknown Has patient had a PCN reaction causing severe rash involving mucus membranes or skin necrosis: unknown Has patient had a PCN reaction that required hospitalization: unknown Has patient had a PCN reaction occurring within the last 10 years:  no If all of the above answers are "NO", then may proceed with Cephalosporin use.   Marland Kitchen Antihistamines, Loratadine-Type Other (See Comments)    depression  . Other     Beta blockers-Novacaine  Caused depression    Medications Reviewed Today    Reviewed by [name], RN (Registered Nurse) on 11/16/17 at 1123  Med List Status: <None>  Medication Order Taking? Sig Documenting Provider Last Dose Status Informant  allopurinol (ZYLOPRIM) 100 MG tablet [id number] Yes Take 100 mg by mouth daily.  [provider] Taking Active Self  Ascorbic Acid (VITAMIN C PO) [id number] Yes Take 1 tablet by mouth daily. [provider] Taking Active Self  B Complex-C (B-COMPLEX WITH VITAMIN C) tablet [id number] Yes Take 1 tablet by mouth daily. Lavina Hamman, MD Taking Active   buPROPion (WELLBUTRIN XL) 150 MG 24 hr tablet [id number] Yes Take 1 tablet (150 mg total) by mouth daily. Ambrose Finland, MD Taking Active Self  cephALEXin (KEFLEX) 500 MG capsule [id number] No TK 1 C PO BID FOR 10 DAYS [provider] Not Taking Active   cholecalciferol (VITAMIN D) 1000 units tablet [id number] Yes Take 1,000 Units by mouth daily. [provider] Taking Active Self  citalopram (CELEXA) 20 MG tablet [id number] Yes Take 1 tablet (20 mg total) by mouth daily. Lavina Hamman, MD Taking Active   Cyanocobalamin (VITAMIN B-12 PO) [id number] Yes Take 1 tablet by mouth daily. [provider] Taking Active Self  diltiazem (CARDIZEM CD) 120 MG 24 hr capsule [id number] Yes Take 1 capsule (120 mg total) by mouth daily. Lavina Hamman, MD Taking Active   feeding supplement [facility] SURGERY) Yehuda Budd [id number] Yes Take 237 mLs by mouth 2 (two) times daily between meals. Lavina Hamman, MD Taking Active   fluticasone [facility]) 50 MCG/ACT nasal spray [id number] Yes Place 2 sprays into both nostrils daily.  Patient taking differently:  Place 2 sprays into both nostrils daily as needed for allergies  or rhinitis.    Waynetta Pean, PA-C Taking Active Self  furosemide (LASIX) 80 MG tablet [id number] Yes Take 1 tablet (80 mg total) by mouth daily. Lavina Hamman, MD Taking Active   gabapentin (NEURONTIN) 100 MG capsule [id number] No Take 2 capsules (200 mg total) by mouth 2 (two) times daily.  Patient not taking:  Reported on 11/16/2017   Lavina Hamman, MD Not Taking Active   glimepiride (AMARYL) 1 MG tablet [id number] Yes Take 1 mg by mouth daily with breakfast.  [provider] Taking Active Self  guaiFENesin (MUCINEX) 600 MG 12 hr tablet [id number] No Take 2 tablets (1,200 mg total) by mouth 2 (two) times daily.  Patient not taking:  Reported on 11/16/2017   Lavina Hamman, MD Not Taking Active   iron polysaccharides (NIFEREX) 150 MG capsule [id number] Yes Take 1 capsule (150 mg total) by mouth 2 (two) times daily. Eugenie Filler, MD Taking Active Self  lovastatin (MEVACOR) 20 MG tablet [id number] Yes Take 20 mg by mouth at bedtime.  [provider] Taking Active Self  meclizine (ANTIVERT) 25 MG tablet [id number] Yes Take 1 tablet (25 mg total) by mouth 3 (three) times daily as needed for dizziness.  Patient taking differently:  Take 25 mg by mouth 2 (two) times daily.    Eber Jones, MD Taking Active Self  metFORMIN (GLUCOPHAGE) 1000 MG tablet [id number] No Take 1,000 mg by mouth 2 (two) times daily.  [provider] Not Taking Active Self           Med Note Geroge Baseman   Tue Oct 22, 2014  7:29 PM)     Multiple Vitamins-Minerals (VISION FORMULA PO) [id number] Yes Take 1 tablet by mouth 2 (two) times daily. [provider] Taking Active Self  OXYGEN [id number] Yes Inhale 2 L into the lungs as needed (shortness of breath).  [provider] Taking Active Self  pantoprazole (PROTONIX) 40 MG tablet [id number] No Take 1 tablet (40 mg total) by mouth 2 (two) times daily.  Patient not taking:  Reported on 11/16/2017   Lavina Hamman, MD  Not Taking Active   potassium chloride SA (K-DUR,KLOR-CON) 20 MEQ tablet [id number] Yes Take 20 mEq by mouth daily.  [provider] Taking Active Self           Med Note Wyatt Portela, ANAIS   Tue Oct 22, 2014  7:28 PM)     senna-docusate (SENOKOT-S) 8.6-50 MG tablet [id number] No Take 2 tablets by mouth 2 (two) times daily.  Patient not taking:  Reported on 11/16/2017   Roxan Hockey, MD Not Taking Active Self  temazepam (RESTORIL) 15 MG capsule [id number] Yes Take 1 capsule (15 mg total) by mouth at bedtime. Roxan Hockey, MD Taking Active Self          Assessment:  Drugs sorted by system:  Neurologic/Psychologic:  Cardiovascular:  Pulmonary/Allergy:  Gastrointestinal:  Endocrine:  Renal:  Topical:  Pain:  Infectious Diseases:  Oncology:  Genitourinary:  Vitamins/Minerals/Supplements:  Miscellaneous:  Medication Review Findings:  . *** . *** . ***  Medication Assistance Findings:  Extra Help:   []  Already receiving Full Extra Help  []  Already receiving Partial Extra Help  []  Eligible based on reported income and assets  []  Not Eligible based on reported income and assets  Patient Assistance Programs: 2) *** made by *** o Income requirement met: []  Yes []  No []  Unknown o Out-of-pocket prescription expenditure met:    []  Yes []  No  []  Unknown  []  Not applicable - ***        2)  *** made by *** o Income requirement met: []  Yes []  No  []  Unknown o Out-of-pocket prescription expenditure met:   []  Yes []  No   []  Unknown []  Not applicable - ***   Additional medication assistance options reviewed with patient as warranted:  {Additional mediction assistance options:21932}  Plan: I will route patient assistance letter to [place] technician who will coordinate patient assistance program application process for medications listed above.  [facility] pharmacy technician will assist with obtaining all required documents from both patient and provider(s)  and submit application(s) once completed.  [place] [facility]) Care Management  [place]   11/18/2017  GEORGIO HATTABAUGH 03-Mar-1932 [id number]  Subjective:  Objective:   Current Medications: Current Outpatient Medications  Medication Sig Dispense Refill  . allopurinol (ZYLOPRIM) 100 MG tablet Take 100 mg by mouth daily.     . Ascorbic Acid (VITAMIN C PO) Take 1 tablet by mouth daily.    . B Complex-C (B-COMPLEX WITH VITAMIN C) tablet Take 1 tablet by mouth daily. 60 tablet 0  . buPROPion (WELLBUTRIN XL) 150 MG 24 hr tablet Take 1 tablet (150 mg total) by mouth daily. 30 tablet 1  . cephALEXin (KEFLEX) 500 MG capsule TK 1 C PO BID FOR 10 DAYS  0  . cholecalciferol (VITAMIN D) 1000 units tablet Take 1,000 Units by mouth daily.    . citalopram (CELEXA) 20 MG tablet Take 1 tablet (20 mg total) by mouth daily. 30 tablet 0  . Cyanocobalamin (VITAMIN B-12 PO) Take 1 tablet by mouth daily.    Marland Kitchen diltiazem (CARDIZEM CD) 120 MG 24 hr capsule Take 1 capsule (120 mg total) by mouth daily. 60 capsule 0  . feeding supplement (ENSURE SURGERY) LIQD Take 237 mLs by mouth 2 (two) times daily between meals. 90 Bottle 0  . fluticasone (FLONASE) 50 MCG/ACT nasal spray Place 2 sprays into both nostrils daily. (Patient taking differently: Place 2 sprays into both nostrils daily as needed for allergies or rhinitis. ) 16 g 1  . furosemide (LASIX) 80 MG tablet Take 1 tablet (80 mg total) by mouth daily. 30 tablet 0  . gabapentin (NEURONTIN) 100 MG capsule Take 2 capsules (200 mg total) by mouth 2 (two) times daily. (Patient not taking: Reported on 11/16/2017) 60 capsule 0  . glimepiride (AMARYL) 1 MG tablet Take 1 mg by mouth daily with breakfast.   0  . guaiFENesin (MUCINEX) 600 MG 12 hr tablet Take 2 tablets (1,200 mg total) by mouth 2 (two) times daily. (Patient not taking: Reported on 11/16/2017) 30 tablet 0  . iron polysaccharides (NIFEREX) 150 MG capsule Take 1 capsule (150 mg  total) by mouth 2 (two) times daily. 60 capsule 0  . lovastatin (MEVACOR) 20 MG tablet Take 20 mg by mouth at bedtime.     . meclizine (ANTIVERT) 25 MG tablet Take 1 tablet (25 mg total) by mouth 3 (three) times daily as needed for dizziness. (Patient taking differently: Take 25 mg by mouth 2 (two) times daily. ) 30 tablet 0  . metFORMIN (GLUCOPHAGE) 1000 MG tablet Take 1,000 mg by mouth 2 (two) times daily.   0  . Multiple Vitamins-Minerals (VISION FORMULA PO) Take 1 tablet by mouth 2 (two) times daily.    . OXYGEN Inhale 2 L into the lungs as needed (shortness of breath).     . pantoprazole (PROTONIX) 40 MG tablet Take 1 tablet (40 mg total) by mouth 2 (two) times daily. (Patient not taking: Reported on 11/16/2017) 60 tablet 0  . potassium chloride SA (K-DUR,KLOR-CON) 20 MEQ tablet Take 20 mEq by mouth daily.   0  . senna-docusate (SENOKOT-S) 8.6-50 MG tablet Take 2 tablets by mouth 2 (two) times daily. (Patient not taking: Reported on 11/16/2017) 120 tablet 1  . temazepam (RESTORIL) 15 MG capsule Take 1 capsule (15 mg total) by mouth at bedtime. 30 capsule 0   No current facility-administered medications for this visit.     Functional Status: In your present state of health, do you have any difficulty performing the following activities:  09/28/2017 08/30/2017  Hearing? [place]? N -  Difficulty concentrating or making decisions? N -  Walking or climbing stairs? Y -  Dressing or bathing? N -  Doing errands, shopping? Tempie Donning  Preparing Food and eating ? N -  Using the Toilet? N -  In the past six months, have you accidently leaked urine? N -  Do you have problems with loss of bowel control? N -  Managing your Medications? N -  Managing your Finances? N -  Housekeeping or managing your Housekeeping? Y -  Some recent data might be hidden    Fall/Depression Screening: Fall Risk  09/28/2017 03/29/2017 07/14/2016  Falls in the past year? Yes Yes No  Number falls in past yr: 1 1 -  Injury  with Fall? Yes Yes -  Risk Factor Category  High Fall Risk - -  Risk for fall due to : History of fall(s) Impaired balance/gait;Impaired mobility -  Follow up Education provided Education provided;Follow up appointment;Falls prevention discussed -   PHQ 2/9 Scores 09/28/2017 03/29/2017 07/14/2016 03/30/2016 01/07/2016 01/07/2016  PHQ - 2 Score 1 0 0 0 0 0    Assessment:  [place] [facility]) Care Management  11/18/2017  SOPHIA CUBERO July 12, 1932 [id number]    [place] [facility]) Care Management  [place]   11/18/2017  LEGRAND LASSER 11-12-1932 [id number]  Reason for referral: ***  Referral source: *** Referral medication(s): *** Current insurance:***  PMHx: ***   HPI: ***   Objective: Allergies  Allergen Reactions  . Penicillins Anaphylaxis    Has patient had a PCN reaction causing immediate rash, facial/tongue/throat swelling, SOB or lightheadedness with hypotension: unknown Has patient had a PCN reaction causing severe rash involving mucus membranes or skin necrosis: unknown Has patient had a PCN reaction that required hospitalization: unknown Has patient had a PCN reaction occurring within the last 10 years: no If all of the above answers are "NO", then may proceed with Cephalosporin use.   Marland Kitchen Antihistamines, Loratadine-Type Other (See Comments)    depression  . Other     Beta blockers-Novacaine  Caused depression    Medications Reviewed Today    Reviewed by [name], RN (Registered Nurse) on 11/16/17 at 1123  Med List Status: <None>  Medication Order Taking? Sig Documenting Provider Last Dose Status Informant  allopurinol (ZYLOPRIM) 100 MG tablet [id number] Yes Take 100 mg by mouth daily.  [provider] Taking Active Self  Ascorbic Acid (VITAMIN C PO) [id number] Yes Take 1 tablet by mouth daily. [provider] Taking Active Self  B Complex-C (B-COMPLEX WITH VITAMIN C) tablet [id number] Yes Take 1 tablet  by mouth daily. Lavina Hamman, MD Taking Active   buPROPion (WELLBUTRIN XL) 150 MG 24 hr tablet [id number] Yes Take 1 tablet (150 mg total) by mouth daily. Ambrose Finland, MD Taking Active Self  cephALEXin (KEFLEX) 500 MG capsule [id number] No TK 1 C PO BID FOR 10 DAYS [provider] Not Taking Active   cholecalciferol (VITAMIN D) 1000 units tablet [id number] Yes Take 1,000 Units by mouth daily. [provider] Taking Active Self  citalopram (CELEXA) 20 MG tablet [id number] Yes Take 1 tablet (20 mg total) by mouth daily. Lavina Hamman, MD Taking Active   Cyanocobalamin (VITAMIN B-12 PO) [id number] Yes Take 1 tablet by mouth daily. [provider] Taking Active Self  diltiazem (CARDIZEM CD) 120 MG 24 hr capsule [id number] Yes Take 1 capsule (120 mg total) by  mouth daily. Lavina Hamman, MD Taking Active   feeding supplement [facility] SURGERY) Yehuda Budd [id number] Yes Take 237 mLs by mouth 2 (two) times daily between meals. Lavina Hamman, MD Taking Active   fluticasone [facility]) 50 MCG/ACT nasal spray [id number] Yes Place 2 sprays into both nostrils daily.  Patient taking differently:  Place 2 sprays into both nostrils daily as needed for allergies or rhinitis.    Waynetta Pean, PA-C Taking Active Self  furosemide (LASIX) 80 MG tablet [id number] Yes Take 1 tablet (80 mg total) by mouth daily. Lavina Hamman, MD Taking Active   gabapentin (NEURONTIN) 100 MG capsule [id number] No Take 2 capsules (200 mg total) by mouth 2 (two) times daily.  Patient not taking:  Reported on 11/16/2017   Lavina Hamman, MD Not Taking Active   glimepiride (AMARYL) 1 MG tablet [id number] Yes Take 1 mg by mouth daily with breakfast.  [provider] Taking Active Self  guaiFENesin (MUCINEX) 600 MG 12 hr tablet [id number] No Take 2 tablets (1,200 mg total) by mouth 2 (two) times daily.  Patient not taking:  Reported on 11/16/2017   Lavina Hamman, MD Not Taking Active   iron  polysaccharides (NIFEREX) 150 MG capsule [id number] Yes Take 1 capsule (150 mg total) by mouth 2 (two) times daily. Eugenie Filler, MD Taking Active Self  lovastatin (MEVACOR) 20 MG tablet [id number] Yes Take 20 mg by mouth at bedtime.  [provider] Taking Active Self  meclizine (ANTIVERT) 25 MG tablet [id number] Yes Take 1 tablet (25 mg total) by mouth 3 (three) times daily as needed for dizziness.  Patient taking differently:  Take 25 mg by mouth 2 (two) times daily.    Eber Jones, MD Taking Active Self  metFORMIN (GLUCOPHAGE) 1000 MG tablet [id number] No Take 1,000 mg by mouth 2 (two) times daily.  [provider] Not Taking Active Self           Med Note Geroge Baseman   Tue Oct 22, 2014  7:29 PM)     Multiple Vitamins-Minerals (VISION FORMULA PO) [id number] Yes Take 1 tablet by mouth 2 (two) times daily. [provider] Taking Active Self  OXYGEN [id number] Yes Inhale 2 L into the lungs as needed (shortness of breath).  [provider] Taking Active Self  pantoprazole (PROTONIX) 40 MG tablet [id number] No Take 1 tablet (40 mg total) by mouth 2 (two) times daily.  Patient not taking:  Reported on 11/16/2017   Lavina Hamman, MD Not Taking Active   potassium chloride SA (K-DUR,KLOR-CON) 20 MEQ tablet [id number] Yes Take 20 mEq by mouth daily.  [provider] Taking Active Self           Med Note Wyatt Portela, ANAIS   Tue Oct 22, 2014  7:28 PM)     senna-docusate (SENOKOT-S) 8.6-50 MG tablet [id number] No Take 2 tablets by mouth 2 (two) times daily.  Patient not taking:  Reported on 11/16/2017   Roxan Hockey, MD Not Taking Active Self  temazepam (RESTORIL) 15 MG capsule [id number] Yes Take 1 capsule (15 mg total) by mouth at bedtime. Roxan Hockey, MD Taking Active Self          Assessment:  Drugs sorted by  system:  Neurologic/Psychologic:  Cardiovascular:  Pulmonary/Allergy:  Gastrointestinal:  Endocrine:  Renal:  Topical:  Pain:  Infectious Diseases:  Oncology:  Genitourinary:  Vitamins/Minerals/Supplements:  Miscellaneous:  Medication Review Findings:  . *** . *** . ***  Medication Assistance  Findings:  Extra Help:   []  Already receiving Full Extra Help  []  Already receiving Partial Extra Help  []  Eligible based on reported income and assets  []  Not Eligible based on reported income and assets  Patient Assistance Programs: 3) *** made by *** o Income requirement met: []  Yes []  No []  Unknown o Out-of-pocket prescription expenditure met:    []  Yes []  No  []  Unknown  []  Not applicable - ***        2)  *** made by *** o Income requirement met: []  Yes []  No  []  Unknown o Out-of-pocket prescription expenditure met:   []  Yes []  No   []  Unknown []  Not applicable - ***   Additional medication assistance options reviewed with patient as warranted:  {Additional mediction assistance options:21932}  Plan: I will route patient assistance letter to [place] technician who will coordinate patient assistance program application process for medications listed above.  [facility] pharmacy technician will assist with obtaining all required documents from both patient and provider(s) and submit application(s) once completed.         [place] [facility]) Care Management  [place] [place]   11/18/2017  ODEL SCHMID November 06, 1932 [id number]   Reason for referral: ***  Referral source: *** Current insurance:***   PMHx: ***   HPI: ***   Objective: Lab Results  Component Value Date   CREATININE 1.17 09/16/2017   CREATININE 0.94 09/15/2017   CREATININE 0.92 09/14/2017    Lab Results  Component Value Date   HGBA1C 6.1 (H) 09/05/2017    Lipid Panel     Component Value Date/Time   CHOL 102 06/29/2015 0534   TRIG 108 06/29/2015 0534   HDL 31  (L) 06/29/2015 0534   CHOLHDL 3.3 06/29/2015 0534   VLDL 22 06/29/2015 0534   LDLCALC 49 06/29/2015 0534    BP Readings from Last 3 Encounters:  11/16/17 138/72  10/12/17 138/64  09/28/17 120/62    Allergies  Allergen Reactions  . Penicillins Anaphylaxis    Has patient had a PCN reaction causing immediate rash, facial/tongue/throat swelling, SOB or lightheadedness with hypotension: unknown Has patient had a PCN reaction causing severe rash involving mucus membranes or skin necrosis: unknown Has patient had a PCN reaction that required hospitalization: unknown Has patient had a PCN reaction occurring within the last 10 years: no If all of the above answers are "NO", then may proceed with Cephalosporin use.   Marland Kitchen Antihistamines, Loratadine-Type Other (See Comments)    depression  . Other     Beta blockers-Novacaine  Caused depression    Medications Reviewed Today    Reviewed by [name], RN (Registered Nurse) on 11/16/17 at 1123  Med List Status: <None>  Medication Order Taking? Sig Documenting Provider Last Dose Status Informant  allopurinol (ZYLOPRIM) 100 MG tablet [id number] Yes Take 100 mg by mouth daily.  [provider] Taking Active Self  Ascorbic Acid (VITAMIN C PO) [id number] Yes Take 1 tablet by mouth daily. [provider] Taking Active Self  B Complex-C (B-COMPLEX WITH VITAMIN C) tablet [id number] Yes Take 1 tablet by mouth daily. Lavina Hamman, MD Taking Active   buPROPion (WELLBUTRIN XL) 150 MG 24 hr tablet [id number] Yes Take 1 tablet (150 mg total) by mouth daily. Ambrose Finland, MD Taking Active Self  cephALEXin (KEFLEX) 500 MG capsule [id number] No TK 1 C PO BID FOR 10 DAYS [provider] Not Taking Active   cholecalciferol (VITAMIN D) 1000 units  tablet [id number] Yes Take 1,000 Units by mouth daily. [provider] Taking Active Self  citalopram (CELEXA) 20 MG tablet [id number] Yes Take 1 tablet (20 mg total) by mouth  daily. Lavina Hamman, MD Taking Active   Cyanocobalamin (VITAMIN B-12 PO) [id number] Yes Take 1 tablet by mouth daily. [provider] Taking Active Self  diltiazem (CARDIZEM CD) 120 MG 24 hr capsule [id number] Yes Take 1 capsule (120 mg total) by mouth daily. Lavina Hamman, MD Taking Active   feeding supplement [facility]) Yehuda Budd [id number] Yes Take 237 mLs by mouth 2 (two) times daily between meals. Lavina Hamman, MD Taking Active   fluticasone [facility]) 50 MCG/ACT nasal spray [id number] Yes Place 2 sprays into both nostrils daily.  Patient taking differently:  Place 2 sprays into both nostrils daily as needed for allergies or rhinitis.    Waynetta Pean, PA-C Taking Active Self  furosemide (LASIX) 80 MG tablet [id number] Yes Take 1 tablet (80 mg total) by mouth daily. Lavina Hamman, MD Taking Active   gabapentin (NEURONTIN) 100 MG capsule [id number] No Take 2 capsules (200 mg total) by mouth 2 (two) times daily.  Patient not taking:  Reported on 11/16/2017   Lavina Hamman, MD Not Taking Active   glimepiride (AMARYL) 1 MG tablet [id number] Yes Take 1 mg by mouth daily with breakfast.  [provider] Taking Active Self  guaiFENesin (MUCINEX) 600 MG 12 hr tablet [id number] No Take 2 tablets (1,200 mg total) by mouth 2 (two) times daily.  Patient not taking:  Reported on 11/16/2017   Lavina Hamman, MD Not Taking Active   iron polysaccharides (NIFEREX) 150 MG capsule [id number] Yes Take 1 capsule (150 mg total) by mouth 2 (two) times daily. Eugenie Filler, MD Taking Active Self  lovastatin (MEVACOR) 20 MG tablet [id number] Yes Take 20 mg by mouth at bedtime.  [provider] Taking Active Self  meclizine (ANTIVERT) 25 MG tablet [id number] Yes Take 1 tablet (25 mg total) by mouth 3 (three) times daily as needed for dizziness.  Patient taking differently:  Take 25 mg by mouth 2 (two) times daily.    Eber Jones, MD Taking Active Self  metFORMIN  (GLUCOPHAGE) 1000 MG tablet [id number] No Take 1,000 mg by mouth 2 (two) times daily.  [provider] Not Taking Active Self           Med Note Geroge Baseman   Tue Oct 22, 2014  7:29 PM)     Multiple Vitamins-Minerals (VISION FORMULA PO) [id number] Yes Take 1 tablet by mouth 2 (two) times daily. [provider] Taking Active Self  OXYGEN [id number] Yes Inhale 2 L into the lungs as needed (shortness of breath).  [provider] Taking Active Self  pantoprazole (PROTONIX) 40 MG tablet [id number] No Take 1 tablet (40 mg total) by mouth 2 (two) times daily.  Patient not taking:  Reported on 11/16/2017   Lavina Hamman, MD Not Taking Active   potassium chloride SA (K-DUR,KLOR-CON) 20 MEQ tablet [id number] Yes Take 20 mEq by mouth daily.  [provider] Taking Active Self           Med Note Wyatt Portela, ANAIS   Tue Oct 22, 2014  7:28 PM)     senna-docusate (SENOKOT-S) 8.6-50 MG tablet [id number] No Take 2 tablets by mouth 2 (two) times daily.  Patient not taking:  Reported on 11/16/2017   Roxan Hockey, MD Not Taking Active Self  temazepam (RESTORIL) 15  MG capsule [id number] Yes Take 1 capsule (15 mg total) by mouth at bedtime. Roxan Hockey, MD Taking Active Self          Assessment:  Drugs sorted by system:  Neurologic/Psychologic:  Cardiovascular:  Pulmonary/Allergy:  Gastrointestinal:  Endocrine:  Renal:  Topical:  Pain:  Infectious Diseases:  Oncology:  Genitourinary:  Vitamins/Minerals/Supplements:  Miscellaneous:  Medication Review Findings:  . *** . *** . ***  Medication Assistance Findings:  Extra Help:   []  Already receiving Full Extra Help  []  Already receiving Partial Extra Help  []  Eligible based on reported income and assets  []  Not Eligible based on reported income and assets  Patient Assistance Programs: 4) *** made by *** o Income requirement met: []  Yes []  No []  Unknown o Out-of-pocket prescription  expenditure met:   []  Yes []  No  []  Unknown  []  Not applicable - ***        2)  *** made by *** o Income requirement met: []  Yes []  No  []  Unknown Out-of-pocket prescription expenditure met:  []  Yes []  No   []  Unknown []  Not [facility]) [place]  11/18/2017  BANKS CHAIKIN [birth year] [id number]   Reason for referral: ***  Unsuccessful telephone call attempt #*** to patient.   {Unsuccessful outreach call outcomes:21933}  Plan:  {Unsuccessful outreach call plans:21934}  [facility] Care Management  11/18/2017  DERAK SCHURMAN Mar 27, 1932 [id number]    [facility]) Care Management  11/18/2017  Ashley [birth year] [id number]    [facility]) Care Management  11/18/2017  ALVARO AUNGST 1932/06/13 [id number]    [place] [facility]) Care Management  [place]   11/18/2017  LANNIE YUSUF 04/17/1932 [id number]  Subjective:  Objective:   Current Medications: Current Outpatient Medications  Medication Sig Dispense Refill  . allopurinol (ZYLOPRIM) 100 MG tablet Take 100 mg by mouth daily.     . Ascorbic Acid (VITAMIN C PO) Take 1 tablet by mouth daily.    . B Complex-C (B-COMPLEX WITH VITAMIN C) tablet Take 1 tablet by mouth daily. 60 tablet 0  . buPROPion (WELLBUTRIN XL) 150 MG 24 hr tablet Take 1 tablet (150 mg total) by mouth daily. 30 tablet 1  . cephALEXin (KEFLEX) 500 MG capsule TK 1 C PO BID FOR 10 DAYS  0  . cholecalciferol (VITAMIN D) 1000 units tablet Take 1,000 Units by mouth daily.    . citalopram (CELEXA) 20 MG tablet Take 1 tablet (20 mg total) by mouth daily. 30 tablet 0  . Cyanocobalamin (VITAMIN B-12 PO) Take 1 tablet by mouth daily.    Marland Kitchen diltiazem (CARDIZEM CD) 120 MG 24 hr capsule Take 1 capsule (120 mg total) by mouth daily. 60 capsule 0  . feeding supplement (ENSURE SURGERY) LIQD Take 237 mLs by mouth 2 (two) times daily  between meals. 90 Bottle 0  . fluticasone (FLONASE) 50 MCG/ACT nasal spray Place 2 sprays into both nostrils daily. (Patient taking differently: Place 2 sprays into both nostrils daily as needed for allergies or rhinitis. ) 16 g 1  . furosemide (LASIX) 80 MG tablet Take 1 tablet (80 mg total) by mouth daily. 30 tablet 0  . gabapentin (NEURONTIN) 100 MG capsule Take 2 capsules (200 mg total) by mouth 2 (two) times daily. (Patient not taking: Reported on 11/16/2017) 60 capsule 0  . glimepiride (AMARYL) 1 MG tablet Take 1 mg by mouth  daily with breakfast.   0  . guaiFENesin (MUCINEX) 600 MG 12 hr tablet Take 2 tablets (1,200 mg total) by mouth 2 (two) times daily. (Patient not taking: Reported on 11/16/2017) 30 tablet 0  . iron polysaccharides (NIFEREX) 150 MG capsule Take 1 capsule (150 mg total) by mouth 2 (two) times daily. 60 capsule 0  . lovastatin (MEVACOR) 20 MG tablet Take 20 mg by mouth at bedtime.     . meclizine (ANTIVERT) 25 MG tablet Take 1 tablet (25 mg total) by mouth 3 (three) times daily as needed for dizziness. (Patient taking differently: Take 25 mg by mouth 2 (two) times daily. ) 30 tablet 0  . metFORMIN (GLUCOPHAGE) 1000 MG tablet Take 1,000 mg by mouth 2 (two) times daily.   0  . Multiple Vitamins-Minerals (VISION FORMULA PO) Take 1 tablet by mouth 2 (two) times daily.    . OXYGEN Inhale 2 L into the lungs as needed (shortness of breath).     . pantoprazole (PROTONIX) 40 MG tablet Take 1 tablet (40 mg total) by mouth 2 (two) times daily. (Patient not taking: Reported on 11/16/2017) 60 tablet 0  . potassium chloride SA (K-DUR,KLOR-CON) 20 MEQ tablet Take 20 mEq by mouth daily.   0  . senna-docusate (SENOKOT-S) 8.6-50 MG tablet Take 2 tablets by mouth 2 (two) times daily. (Patient not taking: Reported on 11/16/2017) 120 tablet 1  . temazepam (RESTORIL) 15 MG capsule Take 1 capsule (15 mg total) by mouth at bedtime. 30 capsule 0   No current facility-administered medications for this  visit.     Functional Status: In your present state of health, do you have any difficulty performing the following activities: 09/28/2017 08/30/2017  Hearing? [place]? N -  Difficulty concentrating or making decisions? N -  Walking or climbing stairs? Y -  Dressing or bathing? N -  Doing errands, shopping? Tempie Donning  Preparing Food and eating ? N -  Using the Toilet? N -  In the past six months, have you accidently leaked urine? N -  Do you have problems with loss of bowel control? N -  Managing your Medications? N -  Managing your Finances? N -  Housekeeping or managing your Housekeeping? Y -  Some recent data might be hidden    Fall/Depression Screening: Fall Risk  09/28/2017 03/29/2017 07/14/2016  Falls in the past year? Yes Yes No  Number falls in past yr: 1 1 -  Injury with Fall? Yes Yes -  Risk Factor Category  High Fall Risk - -  Risk for fall due to : History of fall(s) Impaired balance/gait;Impaired mobility -  Follow up Education provided Education provided;Follow up appointment;Falls prevention discussed -   PHQ 2/9 Scores 09/28/2017 03/29/2017 07/14/2016 03/30/2016 01/07/2016 01/07/2016  PHQ - 2 Score 1 0 0 0 0 0    Assessment:  o Plan:applicable - ***   Additional medication assistance options reviewed with patient as warranted:  {Additional mediction assistance options:21932}  Plan: ***  I will route patient assistance letter to [facility] [place] technician who will coordinate patient assistance program application process for medications listed above.  [facility] pharmacy technician will assist with obtaining all required documents from both patient and provider(s) and submit application(s) once completed.       ASSESSMENT: Date Discharged from Hospital: *** Date Medication Reconciliation Performed: 11/18/2017  Medications Discontinued at Discharge: (Delete if not applicable)  ***   ***  New Medications at Discharge: (delete if applicable)  None*** (Delete this line  if inappropriate)   ***  No new medications were prescribed at discharge.  Patient was recently discharged from hospital and all medications have been reviewed   Drugs sorted by system:  Neurologic/Psychologic:  Cardiovascular:  Pulmonary/Allergy:  Gastrointestinal:  Endocrine:  Renal:  Topical:  Pain:  Vitamins/Minerals:  Infectious Diseases:  Miscellaneous:  Duplications in therapy:  Gaps in therapy:  Medications to avoid in the elderly:  Drug interactions:  Other issues noted:    PLAN: -Instructed patient to take new medications as prescribed and discontinue old medications as prescribed (**change or delete as appropriate) -***  ***INSERT SIGNATURE

## 2017-11-23 ENCOUNTER — Ambulatory Visit: Payer: Self-pay

## 2017-11-23 ENCOUNTER — Other Ambulatory Visit: Payer: Self-pay

## 2017-11-23 NOTE — Patient Outreach (Signed)
[facility]) Care Management  11/23/2017  Joel [name] 05-16-1932 [id number]   84 year old male referred to [place] Management.  [place] services requested for polypharmacy medication review and establishing compliance pill packaging.  PMHx includes, but not limited to, atrial fibrillation, pulmonary hypertension, COPD, chronic GI bleeding, colon and prostate cancer, type 2 diabetes mellitus, bipolar and hyperlipidemia.   Successful outreach to [name].  HIPAA identifiers verified.   [name] states he is very confused about his medications and would like compliance pill packaging.  His [facility] has changed to [facility] and he is displeased with their pharmacy services.    Plan: Home visit 10/24 at 1:30.    Joetta Manners, PharmD Clinical Pharmacist [place] [phone]

## 2017-11-24 ENCOUNTER — Other Ambulatory Visit: Payer: Self-pay

## 2017-11-24 ENCOUNTER — Ambulatory Visit: Payer: Self-pay

## 2017-11-24 NOTE — Patient Outreach (Signed)
[facility]) Care Management  Joel [name]  11/24/2017  Joel [name] May 18, 1932 [id number]  Reason for referral: polypharmacy medication review and help establishing compliance pill packaging.   Referral source: [facility] RN Current insurance:HTA  Successful home visit with Joel [name] today.  HIPAA identifiers verified.   PMHx:  atrial fibrillation, pulmonary hypertension, COPD, chronic GI bleeding, colon and prostate cancer, type 2 diabetes mellitus, bipolar and hyperlipidemia.   Subjective: Joel [name] report that he has a long history of GI bleeds and anemia.  He was recently diagnosed with colon cancer and had a colectomy.  He states his wound is almost healed and that he changed his own bandage today because home health didn't come.  He states he is having trouble managing his medications and would like to start compliance pill packaging.   Objective: Lab Results  Component Value Date   CREATININE 1.17 09/16/2017   CREATININE 0.94 09/15/2017   CREATININE 0.92 09/14/2017    Lab Results  Component Value Date   HGBA1C 6.1 (H) 09/05/2017    Lipid Panel     Component Value Date/Time   CHOL 102 06/29/2015 0534   TRIG 108 06/29/2015 0534   HDL 31 (L) 06/29/2015 0534   CHOLHDL 3.3 06/29/2015 0534   VLDL 22 06/29/2015 0534   LDLCALC 49 06/29/2015 0534    BP Readings from Last 3 Encounters:  11/16/17 138/72  10/12/17 138/64  09/28/17 120/62    Allergies  Allergen Reactions  . Penicillins Anaphylaxis    Has patient had a PCN reaction causing immediate rash, facial/tongue/throat swelling, SOB or lightheadedness with hypotension: unknown Has patient had a PCN reaction causing severe rash involving mucus membranes or skin necrosis: unknown Has patient had a PCN reaction that required hospitalization: unknown Has patient had a PCN reaction occurring within the last 10 years: no If all of the above answers are "NO", then may proceed with Cephalosporin  use.   Marland Kitchen Antihistamines, Loratadine-Type Other (See Comments)    depression  . Other     Beta blockers-Novacaine  Caused depression    Medications Reviewed Today    Reviewed by Dionne Milo, [facility] (Pharmacist) on 11/24/17 at 1617  Med List Status: <None>  Medication Order Taking? Sig Documenting Provider Last Dose Status Informant  allopurinol (ZYLOPRIM) 100 MG tablet [id number] Yes Take 100 mg by mouth daily.  [provider] Taking Active Self  Ascorbic Acid (VITAMIN C PO) [id number] Yes Take 1,000 mg by mouth daily.  [provider] Taking Active Self  B Complex-C (B-COMPLEX WITH VITAMIN C) tablet [id number] Yes Take 1 tablet by mouth daily. Lavina Hamman, MD Taking Active   buPROPion (WELLBUTRIN XL) 150 MG 24 hr tablet [id number] Yes Take 1 tablet (150 mg total) by mouth daily. Ambrose Finland, MD Taking Active Self  Cholecalciferol 5000 units TABS [id number] Yes Take 1 tablet by mouth daily. [provider] Taking Active Self  citalopram (CELEXA) 20 MG tablet [id number] Yes Take 1 tablet (20 mg total) by mouth daily.  Patient taking differently:  Take 20 mg by mouth daily. Pt. has 40 mg tablets on takes every morning.   Lavina Hamman, MD Taking Active Self  diclofenac sodium (VOLTAREN) 1 % GEL [id number] Yes Apply 1 application topically 4 (four) times daily. Pt uses once daily [provider] Taking Active Self  diltiazem (CARDIZEM CD) 120 MG 24 hr capsule [id number] Yes Take 1 capsule (120 mg total) by mouth daily. Lavina Hamman, MD  Taking Active   fluticasone (FLONASE) 50 MCG/ACT nasal spray [id number] Yes Place 2 sprays into both nostrils daily.  Patient taking differently:  Place 2 sprays into both nostrils daily as needed for allergies or rhinitis.    Waynetta Pean, PA-C Taking Active Self  furosemide (LASIX) 80 MG tablet [id number] Yes Take 1 tablet (80 mg total) by mouth daily. Lavina Hamman, MD Taking Active   gabapentin  (NEURONTIN) 100 MG capsule [id number] Yes Take 2 capsules (200 mg total) by mouth 2 (two) times daily. Lavina Hamman, MD Taking Active   glimepiride (AMARYL) 1 MG tablet [id number] No Take 1 mg by mouth daily with breakfast.  [provider] Unknown Active Self  guaifenesin (HUMIBID E) 400 MG TABS tablet [id number] Yes Take 400 mg by mouth every 12 (twelve) hours as needed (for congestion, OTC). [provider] Taking Active   iron polysaccharides (NIFEREX) 150 MG capsule [id number] Yes Take 1 capsule (150 mg total) by mouth 2 (two) times daily.  Patient taking differently:  Take 150 mg by mouth 2 (two) times daily. Patient takes 1 capsule daily.   Eugenie Filler, MD Taking Active Self  lovastatin (MEVACOR) 20 MG tablet [id number] Yes Take 20 mg by mouth at bedtime.  [provider] Taking Active Self  meclizine (ANTIVERT) 25 MG tablet [id number] Yes Take 1 tablet (25 mg total) by mouth 3 (three) times daily as needed for dizziness.  Patient taking differently:  Take 25 mg by mouth at bedtime.    Eber Jones, MD Taking Active Self  metFORMIN (GLUCOPHAGE) 1000 MG tablet [id number] No Take 1,000 mg by mouth 2 (two) times daily.  [provider] Not Taking Active Self           Med Note Geroge Baseman   Tue Oct 22, 2014  7:29 PM)     Multiple Vitamins-Minerals (VISION FORMULA PO) [id number] Yes Take 1 tablet by mouth daily.  [provider] Taking Active Self  OXYGEN [id number] Yes Inhale 2 L into the lungs as needed (shortness of breath).  [provider] Taking Active Self  pantoprazole (PROTONIX) 40 MG tablet [id number] No Take 1 tablet (40 mg total) by mouth 2 (two) times daily.  Patient not taking:  Reported on 11/16/2017   Lavina Hamman, MD Not Taking Active   potassium chloride SA (K-DUR,KLOR-CON) 20 MEQ tablet [id number] Yes Take 20 mEq by mouth daily.  [provider] Taking Active Self           Med Note Wyatt Portela, ANAIS    Tue Oct 22, 2014  7:28 PM)     sodium chloride (OCEAN) 0.65 % SOLN nasal spray [id number] Yes Place 1 spray into both nostrils as needed for congestion. [provider] Taking Active Self  temazepam (RESTORIL) 15 MG capsule [id number] Yes Take 1 capsule (15 mg total) by mouth at bedtime. Roxan Hockey, MD Taking Active Self          Assessment:  Drugs sorted by system:  Neurologic/Psychologic: bupropion, citalopram, temazepam  Cardiovascular: diltiazem, furosemide, lovastatin, potassium chloride  Pulmonary/Allergy: fluticasone nasal, guaifenesin  Gastrointestinal: pantoprazole,   Endocrine:glimeperide, metformin (pt says not taking, will verify)  Topical: diclofenac sodium  Pain: gabapentin  Vitamins/Minerals/Supplements: ascorbic acid, B-complex vitamin with C, cholecalciferol, iron polysaccharides, MVI  Miscellaneous: allopurinol, meclizine, ocean nasal spray  Medication Review Findings:  . Losartan and Valsartan found in home but not on med list.  . Furosemide 40 mg and 80 mg found in the  home.  Will clarify dose. . Iron Polysaccharide - no bottle in home, but random capsules located.  He states dose is 1 daily and Epic list states 1 BID.  Will clarify dose. . Metformin found in home, but patient states it was discontinued. Will clarify. . Amaryl - random pills found with no bottle.  . Citalopram 40 mg tablets in home, but Epic med list said 20 mg daily.  Will clarify.  . Pantoprazole in home, but patient states he does not take them and wants them removed from his med list. . Refill for temazepam called into [facility].  . Deleted the following from medication list at patient's request: Ensure, Senokot-S, Cyanocobalamin . Patient was agreeable to dispose of a large amount of prescription and OTC discontinued and expired medications.   Of note, patient with atrial fibrillation, but is not on anticoagulation due to high bleeding risk per MD.  Will outreach to  PCP to clarify medication list and establish compliance pill packaging.   Plan: Route note to PCP, Dr. Lysle Rubens and follow up with office about med clarification tomorrow.  Joel [name], PharmD Clinical Pharmacist [place] [phone]

## 2017-11-25 ENCOUNTER — Ambulatory Visit: Payer: Self-pay

## 2017-11-25 ENCOUNTER — Other Ambulatory Visit: Payer: Self-pay

## 2017-11-25 DIAGNOSIS — J410 Simple chronic bronchitis: Secondary | ICD-10-CM | POA: Diagnosis not present

## 2017-11-25 DIAGNOSIS — C189 Malignant neoplasm of colon, unspecified: Secondary | ICD-10-CM | POA: Diagnosis not present

## 2017-11-25 DIAGNOSIS — Z9181 History of falling: Secondary | ICD-10-CM | POA: Diagnosis not present

## 2017-11-25 DIAGNOSIS — J449 Chronic obstructive pulmonary disease, unspecified: Secondary | ICD-10-CM | POA: Diagnosis not present

## 2017-11-25 DIAGNOSIS — E662 Morbid (severe) obesity with alveolar hypoventilation: Secondary | ICD-10-CM | POA: Diagnosis not present

## 2017-11-25 DIAGNOSIS — M6281 Muscle weakness (generalized): Secondary | ICD-10-CM | POA: Diagnosis not present

## 2017-11-25 NOTE — Patient Outreach (Signed)
[place] [facility]) Care Management  11/25/2017  ERYK BEAVERS November 18, 1932 [id number]  Outreach call placed to Christena Deem, PharmD at D. Hussain's office.  He states that that he will clarify medication issues from my home visit with Dr. Deforest Hoyles and take over the transfer of his prescriptions to compliance pill packaging with [facility].    Call attempts made unsuccessfully to Mr. Moyano to tell him about transfer of pharmacy services over to [facility]. Will outreach to patient on Tuesday 10/29.  Joetta Manners, PharmD Clinical Pharmacist [place] [phone]

## 2017-11-28 DIAGNOSIS — Z6833 Body mass index (BMI) 33.0-33.9, adult: Secondary | ICD-10-CM | POA: Diagnosis not present

## 2017-11-28 DIAGNOSIS — G8929 Other chronic pain: Secondary | ICD-10-CM | POA: Diagnosis not present

## 2017-11-28 DIAGNOSIS — I2510 Atherosclerotic heart disease of native coronary artery without angina pectoris: Secondary | ICD-10-CM | POA: Diagnosis not present

## 2017-11-28 DIAGNOSIS — F419 Anxiety disorder, unspecified: Secondary | ICD-10-CM | POA: Diagnosis not present

## 2017-11-28 DIAGNOSIS — I350 Nonrheumatic aortic (valve) stenosis: Secondary | ICD-10-CM | POA: Diagnosis not present

## 2017-11-28 DIAGNOSIS — L89152 Pressure ulcer of sacral region, stage 2: Secondary | ICD-10-CM | POA: Diagnosis not present

## 2017-11-28 DIAGNOSIS — I130 Hypertensive heart and chronic kidney disease with heart failure and stage 1 through stage 4 chronic kidney disease, or unspecified chronic kidney disease: Secondary | ICD-10-CM | POA: Diagnosis not present

## 2017-11-28 DIAGNOSIS — M25551 Pain in right hip: Secondary | ICD-10-CM | POA: Diagnosis not present

## 2017-11-28 DIAGNOSIS — Z483 Aftercare following surgery for neoplasm: Secondary | ICD-10-CM | POA: Diagnosis not present

## 2017-11-28 DIAGNOSIS — D500 Iron deficiency anemia secondary to blood loss (chronic): Secondary | ICD-10-CM | POA: Diagnosis not present

## 2017-11-28 DIAGNOSIS — E785 Hyperlipidemia, unspecified: Secondary | ICD-10-CM | POA: Diagnosis not present

## 2017-11-28 DIAGNOSIS — N189 Chronic kidney disease, unspecified: Secondary | ICD-10-CM | POA: Diagnosis not present

## 2017-11-28 DIAGNOSIS — F319 Bipolar disorder, unspecified: Secondary | ICD-10-CM | POA: Diagnosis not present

## 2017-11-28 DIAGNOSIS — I700 Atherosclerosis of aorta: Secondary | ICD-10-CM | POA: Diagnosis not present

## 2017-11-28 DIAGNOSIS — I272 Pulmonary hypertension, unspecified: Secondary | ICD-10-CM | POA: Diagnosis not present

## 2017-11-28 DIAGNOSIS — L89610 Pressure ulcer of right heel, unstageable: Secondary | ICD-10-CM | POA: Diagnosis not present

## 2017-11-28 DIAGNOSIS — K922 Gastrointestinal hemorrhage, unspecified: Secondary | ICD-10-CM | POA: Diagnosis not present

## 2017-11-28 DIAGNOSIS — S72001S Fracture of unspecified part of neck of right femur, sequela: Secondary | ICD-10-CM | POA: Diagnosis not present

## 2017-11-28 DIAGNOSIS — S21209D Unspecified open wound of unspecified back wall of thorax without penetration into thoracic cavity, subsequent encounter: Secondary | ICD-10-CM | POA: Diagnosis not present

## 2017-11-28 DIAGNOSIS — E1122 Type 2 diabetes mellitus with diabetic chronic kidney disease: Secondary | ICD-10-CM | POA: Diagnosis not present

## 2017-11-28 DIAGNOSIS — I4819 Other persistent atrial fibrillation: Secondary | ICD-10-CM | POA: Diagnosis not present

## 2017-11-28 DIAGNOSIS — E669 Obesity, unspecified: Secondary | ICD-10-CM | POA: Diagnosis not present

## 2017-11-28 DIAGNOSIS — D631 Anemia in chronic kidney disease: Secondary | ICD-10-CM | POA: Diagnosis not present

## 2017-11-28 DIAGNOSIS — I509 Heart failure, unspecified: Secondary | ICD-10-CM | POA: Diagnosis not present

## 2017-11-28 DIAGNOSIS — J439 Emphysema, unspecified: Secondary | ICD-10-CM | POA: Diagnosis not present

## 2017-11-29 ENCOUNTER — Ambulatory Visit: Payer: Self-pay

## 2017-11-29 ENCOUNTER — Other Ambulatory Visit: Payer: Self-pay

## 2017-11-29 DIAGNOSIS — C61 Malignant neoplasm of prostate: Secondary | ICD-10-CM | POA: Diagnosis not present

## 2017-11-29 DIAGNOSIS — E782 Mixed hyperlipidemia: Secondary | ICD-10-CM | POA: Diagnosis not present

## 2017-11-29 DIAGNOSIS — D649 Anemia, unspecified: Secondary | ICD-10-CM | POA: Diagnosis not present

## 2017-11-29 DIAGNOSIS — E1142 Type 2 diabetes mellitus with diabetic polyneuropathy: Secondary | ICD-10-CM | POA: Diagnosis not present

## 2017-11-29 DIAGNOSIS — D509 Iron deficiency anemia, unspecified: Secondary | ICD-10-CM | POA: Diagnosis not present

## 2017-11-29 DIAGNOSIS — E1165 Type 2 diabetes mellitus with hyperglycemia: Secondary | ICD-10-CM | POA: Diagnosis not present

## 2017-11-29 DIAGNOSIS — I10 Essential (primary) hypertension: Secondary | ICD-10-CM | POA: Diagnosis not present

## 2017-11-29 DIAGNOSIS — J449 Chronic obstructive pulmonary disease, unspecified: Secondary | ICD-10-CM | POA: Diagnosis not present

## 2017-11-29 DIAGNOSIS — C189 Malignant neoplasm of colon, unspecified: Secondary | ICD-10-CM | POA: Diagnosis not present

## 2017-11-29 DIAGNOSIS — I482 Chronic atrial fibrillation, unspecified: Secondary | ICD-10-CM | POA: Diagnosis not present

## 2017-11-29 DIAGNOSIS — I509 Heart failure, unspecified: Secondary | ICD-10-CM | POA: Diagnosis not present

## 2017-11-29 DIAGNOSIS — N183 Chronic kidney disease, stage 3 (moderate): Secondary | ICD-10-CM | POA: Diagnosis not present

## 2017-11-29 NOTE — Patient Outreach (Signed)
[place] [facility]) Care Management  11/29/2017  Joel [name] Apr 15, 1932 [id number]    [place] [facility]) Care Management  [place]  11/29/2017  Joel [name] 06/19/1932 [id number]   84 year old male referred to [place] Management.  [place] services requested for polypharmacy medication review and establishing compliance pill packaging.  PMHx includes, but not limited to, atrial fibrillation, pulmonary hypertension, COPD, chronic GI bleeding, colon and prostate cancer, type 2 diabetes mellitus, bipolar and hyperlipidemia.   Incoming call received from Christena Deem, PharmD with [facility].  He is the pharmacist in Dr. Glenna Durand office.  Medication Clarification per office PharmD.  Updated Epic medication list.    Losartan and Valsartan- disontinued  Furosemide-80 mg daily.  Polysaccaride Iron Complex - 1 capsule twice daily.  Citalopram- 20 mg daily.  Metformin- Patient should be on 1000 mg twice daily with meals.  Patient reports that this was discontinued, but office notes have him on metformin.   Amaryl- 1 mg daily.  Successful outreach attempt to [name] and HIPAA identifiers verified.   Informed [name] that I will be handing over his case to Christena Deem, PharmD in Dr. Glenna Durand office and that he will be calling to schedule a home visit for Friday at 1:30 pm.  He is aware they will use [facility] for his compliance pill packaging.    Plan: Route discipline closure letter to Dr. Lysle Rubens.  Inform [facility] RN, [name] of pharmacy case closure.   Joetta Manners, PharmD Clinical Pharmacist [place] [phone]

## 2017-12-01 DIAGNOSIS — T8189XA Other complications of procedures, not elsewhere classified, initial encounter: Secondary | ICD-10-CM | POA: Diagnosis not present

## 2017-12-01 DIAGNOSIS — L89122 Pressure ulcer of left upper back, stage 2: Secondary | ICD-10-CM | POA: Diagnosis not present

## 2017-12-05 DIAGNOSIS — L89152 Pressure ulcer of sacral region, stage 2: Secondary | ICD-10-CM | POA: Diagnosis not present

## 2017-12-05 DIAGNOSIS — I272 Pulmonary hypertension, unspecified: Secondary | ICD-10-CM | POA: Diagnosis not present

## 2017-12-05 DIAGNOSIS — D631 Anemia in chronic kidney disease: Secondary | ICD-10-CM | POA: Diagnosis not present

## 2017-12-05 DIAGNOSIS — E1122 Type 2 diabetes mellitus with diabetic chronic kidney disease: Secondary | ICD-10-CM | POA: Diagnosis not present

## 2017-12-05 DIAGNOSIS — M25551 Pain in right hip: Secondary | ICD-10-CM | POA: Diagnosis not present

## 2017-12-05 DIAGNOSIS — L89610 Pressure ulcer of right heel, unstageable: Secondary | ICD-10-CM | POA: Diagnosis not present

## 2017-12-05 DIAGNOSIS — G8929 Other chronic pain: Secondary | ICD-10-CM | POA: Diagnosis not present

## 2017-12-05 DIAGNOSIS — N189 Chronic kidney disease, unspecified: Secondary | ICD-10-CM | POA: Diagnosis not present

## 2017-12-05 DIAGNOSIS — I350 Nonrheumatic aortic (valve) stenosis: Secondary | ICD-10-CM | POA: Diagnosis not present

## 2017-12-05 DIAGNOSIS — I509 Heart failure, unspecified: Secondary | ICD-10-CM | POA: Diagnosis not present

## 2017-12-05 DIAGNOSIS — K922 Gastrointestinal hemorrhage, unspecified: Secondary | ICD-10-CM | POA: Diagnosis not present

## 2017-12-05 DIAGNOSIS — E669 Obesity, unspecified: Secondary | ICD-10-CM | POA: Diagnosis not present

## 2017-12-05 DIAGNOSIS — J439 Emphysema, unspecified: Secondary | ICD-10-CM | POA: Diagnosis not present

## 2017-12-05 DIAGNOSIS — Z6833 Body mass index (BMI) 33.0-33.9, adult: Secondary | ICD-10-CM | POA: Diagnosis not present

## 2017-12-05 DIAGNOSIS — I4819 Other persistent atrial fibrillation: Secondary | ICD-10-CM | POA: Diagnosis not present

## 2017-12-05 DIAGNOSIS — F319 Bipolar disorder, unspecified: Secondary | ICD-10-CM | POA: Diagnosis not present

## 2017-12-05 DIAGNOSIS — I700 Atherosclerosis of aorta: Secondary | ICD-10-CM | POA: Diagnosis not present

## 2017-12-05 DIAGNOSIS — F419 Anxiety disorder, unspecified: Secondary | ICD-10-CM | POA: Diagnosis not present

## 2017-12-05 DIAGNOSIS — E785 Hyperlipidemia, unspecified: Secondary | ICD-10-CM | POA: Diagnosis not present

## 2017-12-05 DIAGNOSIS — S72001S Fracture of unspecified part of neck of right femur, sequela: Secondary | ICD-10-CM | POA: Diagnosis not present

## 2017-12-05 DIAGNOSIS — I130 Hypertensive heart and chronic kidney disease with heart failure and stage 1 through stage 4 chronic kidney disease, or unspecified chronic kidney disease: Secondary | ICD-10-CM | POA: Diagnosis not present

## 2017-12-05 DIAGNOSIS — I2510 Atherosclerotic heart disease of native coronary artery without angina pectoris: Secondary | ICD-10-CM | POA: Diagnosis not present

## 2017-12-05 DIAGNOSIS — S21209D Unspecified open wound of unspecified back wall of thorax without penetration into thoracic cavity, subsequent encounter: Secondary | ICD-10-CM | POA: Diagnosis not present

## 2017-12-05 DIAGNOSIS — D500 Iron deficiency anemia secondary to blood loss (chronic): Secondary | ICD-10-CM | POA: Diagnosis not present

## 2017-12-05 DIAGNOSIS — Z483 Aftercare following surgery for neoplasm: Secondary | ICD-10-CM | POA: Diagnosis not present

## 2017-12-06 ENCOUNTER — Ambulatory Visit (INDEPENDENT_AMBULATORY_CARE_PROVIDER_SITE_OTHER): Payer: PPO | Admitting: Orthopaedic Surgery

## 2017-12-06 ENCOUNTER — Encounter (INDEPENDENT_AMBULATORY_CARE_PROVIDER_SITE_OTHER): Payer: Self-pay | Admitting: Orthopaedic Surgery

## 2017-12-06 ENCOUNTER — Ambulatory Visit (INDEPENDENT_AMBULATORY_CARE_PROVIDER_SITE_OTHER): Payer: PPO

## 2017-12-06 VITALS — BP 137/69 | HR 68 | Ht 72.0 in | Wt 227.0 lb

## 2017-12-06 DIAGNOSIS — M25551 Pain in right hip: Secondary | ICD-10-CM | POA: Diagnosis not present

## 2017-12-06 DIAGNOSIS — M1711 Unilateral primary osteoarthritis, right knee: Secondary | ICD-10-CM

## 2017-12-06 NOTE — Progress Notes (Signed)
Office Visit Note   Patient: Joel [name]           Date of Birth: 10/26/1932           MRN: [id number] Visit Date: 12/06/2017              Requested by: Wenda Low, MD [address] [place], [place] [address] PCP: Wenda Low, MD   Assessment & Plan: Visit Diagnoses:  1. Pain in right hip   2. Unilateral primary osteoarthritis, right knee     Plan: Right knee injection performed.  To watch his sugars carefully.  He has a walker he can use for ambulation and I will check him back again in 3 months.  Follow-Up Instructions: Return in about 3 months (around 03/08/2018).   Orders:  Orders Placed This Encounter  Procedures  . XR HIP UNILAT W OR W/O PELVIS 2-3 VIEWS RIGHT   No orders of the defined types were placed in this encounter.     Procedures: No procedures performed   Clinical Data: No additional findings.   Subjective: No chief complaint on file.   HPI 84 year old male returns post cannulated screw fixation femoral neck fracture.  In the interim he had bleeding problems with severe anemia ultimately was diagnosed with colon cancer had successful surgery and states he is doing much better but has some problems with wound healing and has a dressing on the abdomen.Marland Kitchen  He is walking fairly well then started having some increased pain in his hip and doing some therapy and also had increased pain in his knee.  Has been bone-on-bone changes in his right knee with severe osteoarthritis and is requesting a repeat injection.  No x-rays been obtained of his hip since May.  Review of Systems reviewed updated positive for diabetes dyspnea recent colon cancer otherwise negative is pertains HPI since his  Hip pinning surgery.   Objective: Vital Signs: BP 137/69   Pulse 68   Ht 6' (1.829 m)   Wt 227 lb (103 kg)   BMI 30.79 kg/m   Physical Exam  Constitutional: He is oriented to person, place, and time. He appears well-developed and well-nourished.    HENT:  Head: Normocephalic and atraumatic.  Eyes: Pupils are equal, round, and reactive to light. EOM are normal.  Neck: No tracheal deviation present. No thyromegaly present.  Cardiovascular: Normal rate.  Pulmonary/Chest: Effort normal. He has no wheezes.  Abdominal: Soft. Bowel sounds are normal.  Midline dressing present after his abdominal surgery with colon resection  Neurological: He is alert and oriented to person, place, and time.  Skin: Skin is warm and dry. Capillary refill takes less than 2 seconds.  Psychiatric: He has a normal mood and affect. His behavior is normal. Judgment and thought content normal.    Ortho Exam patient has bilateral pitting edema with slight erythema distal tibia consistent with some venous stasis changes.  Knee crepitus he lacks 20 degrees reaching extension.  Minimal internal/external rotation right hip but is not particularly painful mild tenderness over the trochanter pins are not palpable and are minimally tender.  Specialty Comments:  No specialty comments available.  Imaging: Xr Hip Unilat W Or W/o Pelvis 2-3 Views Right  Result Date: 12/06/2017 AP frog-leg x-ray right hip obtained and reviewed.  Patient has had some back out of the cannulated pins no penetration into the joint.  No apparent slippage at the fracture site.  No lucency around the screws. Impression post cannulated pin  fixation femoral neck fracture with slight back out of the screws.  Fracture appears to have healed.    PMFS History: Patient Active Problem List   Diagnosis Date Noted  . Pain in right hip 12/06/2017  . Unilateral primary osteoarthritis, right knee 12/06/2017  . Normal coronary arteries 09/27/2017  . Pressure injury of skin 09/07/2017  . S/P colon resection 09/05/2017  . Cancer of ascending colon ([place]) 09/05/2017  . Dyspnea 09/01/2017  . Blood loss anemia 08/30/2017  . Closed displaced fracture of right femoral neck ([place]) 03/22/2017  . Cough 01/17/2017   . Persistent atrial fibrillation 01/17/2017  . Leg swelling 09/26/2016  . GI bleed 05/15/2016  . UGI bleed 05/14/2016  . COPD (chronic obstructive pulmonary disease) ([place]) 05/14/2016  . Chronic GI bleeding 04/12/2016  . HLD (hyperlipidemia) 04/12/2016  . Anemia due to chronic blood loss 04/12/2016  . Diabetes mellitus with complication ([place])   . BPPV (benign paroxysmal positional vertigo) 01/27/2016  . Hypertension 01/27/2016  . Malignant neoplasm of prostate ([place]) 01/07/2016  . Bipolar I disorder ([place])   . Bright red rectal bleeding 12/16/2015  . Rectal bleeding 12/16/2015  . Pulmonary hypertension ([place]) 06/29/2015  . Moderate aortic stenosis 04/17/2013  . Encounter for monitoring Coumadin therapy 04/17/2013  . Anemia, iron deficiency 04/04/2013  . Dyspnea on exertion 11/18/2012  . Obesity hypoventilation syndrome ([place]) 09/19/2012  . ALLERGIC RHINITIS 06/24/2009  . DM2 (diabetes mellitus, type 2) ([place]) 03/13/2007  . OSA (obstructive sleep apnea) 03/13/2007  . COPD mixed type ([place]) 03/13/2007   Past Medical History:  Diagnosis Date  . Allergic rhinitis   . Anemia   . Anxiety   . Aortic stenosis 2012   mild to mod   . Atrial fibrillation ([place]) 1991   with multiple DCCV  . Bipolar affective disorder ([place])   . Cancer of ascending colon ([place]) 09/05/2017  . CHF (congestive heart failure) ([place]) 08/30/2017  . COPD (chronic obstructive pulmonary disease) ([place])   . Diabetic neuropathy ([place])    in feet  . Diabetic neuropathy ([place])   . Dyslipidemia   . Fatigue    last year or so  . Gout   . Hypertension   . Insomnia   . Obesity   . On home oxygen therapy 2 1/2 liters with bipap at night  . OSA (obstructive sleep apnea)    severe, uses bipap 16 1/2 by 12 or 13 setting  . Prostate cancer ([place])   . Rectal bleeding 12/16/2015  . Type 2 diabetes mellitus ([place])   . Vertigo     Family History  Problem Relation Age of Onset  . Tuberculosis Maternal Grandmother   . Heart  attack Neg Hx   . Diabetes Neg Hx   . CAD Neg Hx   . Cancer Neg Hx     Past Surgical History:  Procedure Laterality Date  . BIOPSY  09/02/2017   Procedure: BIOPSY;  Surgeon: Ronald Lobo, MD;  [facility]: [facility];  Service: Endoscopy;;  . CARDIOVERSION  yrs ago   prior to 1998  . COLON RESECTION N/A 09/05/2017   Procedure: LAPAROSCOPIC RIGHT COLON RESECTION;  Surgeon: Fanny Skates, MD;  Location: [facility];  Service: General;  Laterality: N/A;  . COLONOSCOPY N/A 09/02/2017   Procedure: COLONOSCOPY;  Surgeon: Ronald Lobo, MD;  [facility];  Service: Endoscopy;  Laterality: N/A;  . COLONOSCOPY WITH PROPOFOL N/A 01/23/2013   Procedure: COLONOSCOPY WITH PROPOFOL;  Surgeon: Garlan Fair, MD;  [facility];  Service: Endoscopy;  Laterality: N/A;  . electro shock  1969   for depression  . ENTEROSCOPY Left 05/17/2016   Procedure: ENTEROSCOPY;  Surgeon: Wilford Corner, MD;  [facility];  Service: Endoscopy;  Laterality: Left;  . ESOPHAGOGASTRODUODENOSCOPY N/A 03/03/2016   Procedure: ESOPHAGOGASTRODUODENOSCOPY (EGD);  Surgeon: Teena Irani, MD;  [facility];  Service: Endoscopy;  Laterality: N/A;  . ESOPHAGOGASTRODUODENOSCOPY N/A 03/26/2016   Procedure: ESOPHAGOGASTRODUODENOSCOPY (EGD);  Surgeon: Teena Irani, MD;  Location[facility];  Service: Endoscopy;  Laterality: N/A;  would like to use ultraslim scope  . ESOPHAGOGASTRODUODENOSCOPY N/A 08/31/2017   Procedure: ESOPHAGOGASTRODUODENOSCOPY (EGD);  Surgeon: Laurence Spates, MD;  Location: [facility];  Service: Endoscopy;  Laterality: N/A;  . ESOPHAGOGASTRODUODENOSCOPY (EGD) WITH PROPOFOL N/A 01/23/2013   Procedure: ESOPHAGOGASTRODUODENOSCOPY (EGD) WITH PROPOFOL;  Surgeon: Garlan Fair, MD;  [facility];  Service: Endoscopy;  Laterality: N/A;  . ESOPHAGOGASTRODUODENOSCOPY (EGD) WITH PROPOFOL N/A 04/13/2016   Procedure: ESOPHAGOGASTRODUODENOSCOPY (EGD) WITH PROPOFOL;  Surgeon: Otis Brace, MD;  Location: [facility];  Service: Gastroenterology;  Laterality: N/A;  . GIVENS CAPSULE STUDY N/A 03/03/2016   Procedure: GIVENS CAPSULE STUDY;  Surgeon: Teena Irani, MD;  [facility];  Service: Endoscopy;  Laterality: N/A;  . HEMORRHOID SURGERY N/A 12/16/2015   Procedure: PROCTOSCOPY WITH CONTROL OF BLEEDING;  Surgeon: Fanny Skates, MD;  Location: [place] [facility];  Service: General;  Laterality: N/A;  . HIP PINNING,CANNULATED Right 03/23/2017   Procedure: CANNULATED HIP PINNING;  Surgeon: Marybelle Killings, MD;  [facility];  Service: Orthopedics;  Laterality: Right;  . KNEE SURGERY  1970's   rt  . LAPAROTOMY N/A 09/10/2017   Procedure: EXPLORATORY LAPAROTOMY, PARTIAL COLECTOMY;  Surgeon: Excell Seltzer, MD;  [facility];  Service: General;  Laterality: N/A;  . [place]  . POLYPECTOMY  09/02/2017   Procedure: POLYPECTOMY;  Surgeon: Ronald Lobo, MD;  [facility];  Service: Endoscopy;;  . PROSTATE BIOPSY    . SHOULDER SURGERY  7-8 yrs ago   rt   Social History   Occupational History  . Occupation: Surveyor  Tobacco Use  . Smoking status: Former Smoker    Packs/day: 2.00    Years: 25.00    Pack years: 50.00    Types: Cigarettes    Last attempt to quit: 07/08/1976    Years since quitting: 41.4  . Smokeless tobacco: Never Used  Substance and Sexual Activity  . Alcohol use: No    Comment: in AA since 1977, no alcohol since 1977  . Drug use: No    Types: Marijuana    Comment: marijuana use 37 years ago  . Sexual activity: Yes

## 2017-12-07 DIAGNOSIS — H04123 Dry eye syndrome of bilateral lacrimal glands: Secondary | ICD-10-CM | POA: Diagnosis not present

## 2017-12-07 DIAGNOSIS — E119 Type 2 diabetes mellitus without complications: Secondary | ICD-10-CM | POA: Diagnosis not present

## 2017-12-07 DIAGNOSIS — Z961 Presence of intraocular lens: Secondary | ICD-10-CM | POA: Diagnosis not present

## 2017-12-07 DIAGNOSIS — H26491 Other secondary cataract, right eye: Secondary | ICD-10-CM | POA: Diagnosis not present

## 2017-12-07 DIAGNOSIS — H353121 Nonexudative age-related macular degeneration, left eye, early dry stage: Secondary | ICD-10-CM | POA: Diagnosis not present

## 2017-12-12 DIAGNOSIS — E1142 Type 2 diabetes mellitus with diabetic polyneuropathy: Secondary | ICD-10-CM | POA: Diagnosis not present

## 2017-12-12 DIAGNOSIS — J449 Chronic obstructive pulmonary disease, unspecified: Secondary | ICD-10-CM | POA: Diagnosis not present

## 2017-12-12 DIAGNOSIS — C61 Malignant neoplasm of prostate: Secondary | ICD-10-CM | POA: Diagnosis not present

## 2017-12-12 DIAGNOSIS — E782 Mixed hyperlipidemia: Secondary | ICD-10-CM | POA: Diagnosis not present

## 2017-12-12 DIAGNOSIS — C189 Malignant neoplasm of colon, unspecified: Secondary | ICD-10-CM | POA: Diagnosis not present

## 2017-12-12 DIAGNOSIS — F3341 Major depressive disorder, recurrent, in partial remission: Secondary | ICD-10-CM | POA: Diagnosis not present

## 2017-12-12 DIAGNOSIS — I10 Essential (primary) hypertension: Secondary | ICD-10-CM | POA: Diagnosis not present

## 2017-12-12 DIAGNOSIS — N183 Chronic kidney disease, stage 3 (moderate): Secondary | ICD-10-CM | POA: Diagnosis not present

## 2017-12-16 DIAGNOSIS — E8770 Fluid overload, unspecified: Secondary | ICD-10-CM | POA: Diagnosis not present

## 2017-12-16 DIAGNOSIS — J209 Acute bronchitis, unspecified: Secondary | ICD-10-CM | POA: Diagnosis not present

## 2017-12-16 DIAGNOSIS — D649 Anemia, unspecified: Secondary | ICD-10-CM | POA: Diagnosis not present

## 2017-12-19 DIAGNOSIS — D649 Anemia, unspecified: Secondary | ICD-10-CM | POA: Diagnosis not present

## 2017-12-20 DIAGNOSIS — I4819 Other persistent atrial fibrillation: Secondary | ICD-10-CM | POA: Diagnosis not present

## 2017-12-20 DIAGNOSIS — S21209D Unspecified open wound of unspecified back wall of thorax without penetration into thoracic cavity, subsequent encounter: Secondary | ICD-10-CM | POA: Diagnosis not present

## 2017-12-20 DIAGNOSIS — G8929 Other chronic pain: Secondary | ICD-10-CM | POA: Diagnosis not present

## 2017-12-20 DIAGNOSIS — E669 Obesity, unspecified: Secondary | ICD-10-CM | POA: Diagnosis not present

## 2017-12-20 DIAGNOSIS — Z6833 Body mass index (BMI) 33.0-33.9, adult: Secondary | ICD-10-CM | POA: Diagnosis not present

## 2017-12-20 DIAGNOSIS — Z483 Aftercare following surgery for neoplasm: Secondary | ICD-10-CM | POA: Diagnosis not present

## 2017-12-20 DIAGNOSIS — L89610 Pressure ulcer of right heel, unstageable: Secondary | ICD-10-CM | POA: Diagnosis not present

## 2017-12-20 DIAGNOSIS — I700 Atherosclerosis of aorta: Secondary | ICD-10-CM | POA: Diagnosis not present

## 2017-12-20 DIAGNOSIS — L89152 Pressure ulcer of sacral region, stage 2: Secondary | ICD-10-CM | POA: Diagnosis not present

## 2017-12-20 DIAGNOSIS — F319 Bipolar disorder, unspecified: Secondary | ICD-10-CM | POA: Diagnosis not present

## 2017-12-20 DIAGNOSIS — I2510 Atherosclerotic heart disease of native coronary artery without angina pectoris: Secondary | ICD-10-CM | POA: Diagnosis not present

## 2017-12-20 DIAGNOSIS — S72001S Fracture of unspecified part of neck of right femur, sequela: Secondary | ICD-10-CM | POA: Diagnosis not present

## 2017-12-20 DIAGNOSIS — I130 Hypertensive heart and chronic kidney disease with heart failure and stage 1 through stage 4 chronic kidney disease, or unspecified chronic kidney disease: Secondary | ICD-10-CM | POA: Diagnosis not present

## 2017-12-20 DIAGNOSIS — E785 Hyperlipidemia, unspecified: Secondary | ICD-10-CM | POA: Diagnosis not present

## 2017-12-20 DIAGNOSIS — D500 Iron deficiency anemia secondary to blood loss (chronic): Secondary | ICD-10-CM | POA: Diagnosis not present

## 2017-12-20 DIAGNOSIS — D631 Anemia in chronic kidney disease: Secondary | ICD-10-CM | POA: Diagnosis not present

## 2017-12-20 DIAGNOSIS — I509 Heart failure, unspecified: Secondary | ICD-10-CM | POA: Diagnosis not present

## 2017-12-20 DIAGNOSIS — N189 Chronic kidney disease, unspecified: Secondary | ICD-10-CM | POA: Diagnosis not present

## 2017-12-20 DIAGNOSIS — E1122 Type 2 diabetes mellitus with diabetic chronic kidney disease: Secondary | ICD-10-CM | POA: Diagnosis not present

## 2017-12-20 DIAGNOSIS — I350 Nonrheumatic aortic (valve) stenosis: Secondary | ICD-10-CM | POA: Diagnosis not present

## 2017-12-20 DIAGNOSIS — I272 Pulmonary hypertension, unspecified: Secondary | ICD-10-CM | POA: Diagnosis not present

## 2017-12-20 DIAGNOSIS — K922 Gastrointestinal hemorrhage, unspecified: Secondary | ICD-10-CM | POA: Diagnosis not present

## 2017-12-20 DIAGNOSIS — J439 Emphysema, unspecified: Secondary | ICD-10-CM | POA: Diagnosis not present

## 2017-12-20 DIAGNOSIS — M25551 Pain in right hip: Secondary | ICD-10-CM | POA: Diagnosis not present

## 2017-12-20 DIAGNOSIS — F419 Anxiety disorder, unspecified: Secondary | ICD-10-CM | POA: Diagnosis not present

## 2017-12-21 DIAGNOSIS — M25551 Pain in right hip: Secondary | ICD-10-CM | POA: Diagnosis not present

## 2017-12-21 DIAGNOSIS — K922 Gastrointestinal hemorrhage, unspecified: Secondary | ICD-10-CM | POA: Diagnosis not present

## 2017-12-21 DIAGNOSIS — I272 Pulmonary hypertension, unspecified: Secondary | ICD-10-CM | POA: Diagnosis not present

## 2017-12-21 DIAGNOSIS — E669 Obesity, unspecified: Secondary | ICD-10-CM | POA: Diagnosis not present

## 2017-12-21 DIAGNOSIS — I2510 Atherosclerotic heart disease of native coronary artery without angina pectoris: Secondary | ICD-10-CM | POA: Diagnosis not present

## 2017-12-21 DIAGNOSIS — E1122 Type 2 diabetes mellitus with diabetic chronic kidney disease: Secondary | ICD-10-CM | POA: Diagnosis not present

## 2017-12-21 DIAGNOSIS — S21209D Unspecified open wound of unspecified back wall of thorax without penetration into thoracic cavity, subsequent encounter: Secondary | ICD-10-CM | POA: Diagnosis not present

## 2017-12-21 DIAGNOSIS — I4819 Other persistent atrial fibrillation: Secondary | ICD-10-CM | POA: Diagnosis not present

## 2017-12-21 DIAGNOSIS — M2351 Chronic instability of knee, right knee: Secondary | ICD-10-CM | POA: Diagnosis not present

## 2017-12-21 DIAGNOSIS — I130 Hypertensive heart and chronic kidney disease with heart failure and stage 1 through stage 4 chronic kidney disease, or unspecified chronic kidney disease: Secondary | ICD-10-CM | POA: Diagnosis not present

## 2017-12-21 DIAGNOSIS — E785 Hyperlipidemia, unspecified: Secondary | ICD-10-CM | POA: Diagnosis not present

## 2017-12-21 DIAGNOSIS — G8929 Other chronic pain: Secondary | ICD-10-CM | POA: Diagnosis not present

## 2017-12-21 DIAGNOSIS — D500 Iron deficiency anemia secondary to blood loss (chronic): Secondary | ICD-10-CM | POA: Diagnosis not present

## 2017-12-21 DIAGNOSIS — J439 Emphysema, unspecified: Secondary | ICD-10-CM | POA: Diagnosis not present

## 2017-12-21 DIAGNOSIS — N189 Chronic kidney disease, unspecified: Secondary | ICD-10-CM | POA: Diagnosis not present

## 2017-12-21 DIAGNOSIS — I509 Heart failure, unspecified: Secondary | ICD-10-CM | POA: Diagnosis not present

## 2017-12-21 DIAGNOSIS — L89610 Pressure ulcer of right heel, unstageable: Secondary | ICD-10-CM | POA: Diagnosis not present

## 2017-12-21 DIAGNOSIS — L89152 Pressure ulcer of sacral region, stage 2: Secondary | ICD-10-CM | POA: Diagnosis not present

## 2017-12-21 DIAGNOSIS — Z483 Aftercare following surgery for neoplasm: Secondary | ICD-10-CM | POA: Diagnosis not present

## 2017-12-21 DIAGNOSIS — S72001S Fracture of unspecified part of neck of right femur, sequela: Secondary | ICD-10-CM | POA: Diagnosis not present

## 2017-12-21 DIAGNOSIS — Z6833 Body mass index (BMI) 33.0-33.9, adult: Secondary | ICD-10-CM | POA: Diagnosis not present

## 2017-12-21 DIAGNOSIS — I700 Atherosclerosis of aorta: Secondary | ICD-10-CM | POA: Diagnosis not present

## 2017-12-21 DIAGNOSIS — D631 Anemia in chronic kidney disease: Secondary | ICD-10-CM | POA: Diagnosis not present

## 2017-12-21 DIAGNOSIS — F319 Bipolar disorder, unspecified: Secondary | ICD-10-CM | POA: Diagnosis not present

## 2017-12-21 DIAGNOSIS — I350 Nonrheumatic aortic (valve) stenosis: Secondary | ICD-10-CM | POA: Diagnosis not present

## 2017-12-21 DIAGNOSIS — F419 Anxiety disorder, unspecified: Secondary | ICD-10-CM | POA: Diagnosis not present

## 2017-12-22 DIAGNOSIS — D500 Iron deficiency anemia secondary to blood loss (chronic): Secondary | ICD-10-CM | POA: Diagnosis not present

## 2017-12-22 DIAGNOSIS — K922 Gastrointestinal hemorrhage, unspecified: Secondary | ICD-10-CM | POA: Diagnosis not present

## 2017-12-22 DIAGNOSIS — Z6833 Body mass index (BMI) 33.0-33.9, adult: Secondary | ICD-10-CM | POA: Diagnosis not present

## 2017-12-22 DIAGNOSIS — D649 Anemia, unspecified: Secondary | ICD-10-CM | POA: Diagnosis not present

## 2017-12-22 DIAGNOSIS — R05 Cough: Secondary | ICD-10-CM | POA: Diagnosis not present

## 2017-12-22 DIAGNOSIS — I700 Atherosclerosis of aorta: Secondary | ICD-10-CM | POA: Diagnosis not present

## 2017-12-22 DIAGNOSIS — I2510 Atherosclerotic heart disease of native coronary artery without angina pectoris: Secondary | ICD-10-CM | POA: Diagnosis not present

## 2017-12-22 DIAGNOSIS — E669 Obesity, unspecified: Secondary | ICD-10-CM | POA: Diagnosis not present

## 2017-12-22 DIAGNOSIS — G4733 Obstructive sleep apnea (adult) (pediatric): Secondary | ICD-10-CM | POA: Diagnosis not present

## 2017-12-22 DIAGNOSIS — Z483 Aftercare following surgery for neoplasm: Secondary | ICD-10-CM | POA: Diagnosis not present

## 2017-12-22 DIAGNOSIS — I509 Heart failure, unspecified: Secondary | ICD-10-CM | POA: Diagnosis not present

## 2017-12-26 ENCOUNTER — Other Ambulatory Visit: Payer: Self-pay | Admitting: *Deleted

## 2017-12-26 DIAGNOSIS — E662 Morbid (severe) obesity with alveolar hypoventilation: Secondary | ICD-10-CM | POA: Diagnosis not present

## 2017-12-26 DIAGNOSIS — Z9181 History of falling: Secondary | ICD-10-CM | POA: Diagnosis not present

## 2017-12-26 DIAGNOSIS — J410 Simple chronic bronchitis: Secondary | ICD-10-CM | POA: Diagnosis not present

## 2017-12-26 DIAGNOSIS — J449 Chronic obstructive pulmonary disease, unspecified: Secondary | ICD-10-CM | POA: Diagnosis not present

## 2017-12-26 DIAGNOSIS — C189 Malignant neoplasm of colon, unspecified: Secondary | ICD-10-CM | POA: Diagnosis not present

## 2017-12-26 DIAGNOSIS — M6281 Muscle weakness (generalized): Secondary | ICD-10-CM | POA: Diagnosis not present

## 2017-12-26 NOTE — Patient Outreach (Signed)
[facility]) Care Management  12/26/2017  HARTMAN MINAHAN 1932/05/16 [id number]   Call placed to member to follow up on current health status.  No answer, HIPAA compliant voice message left.  Will follow up within the next 4 business days.  [name], [place], MSN [place] [phone]

## 2018-01-02 ENCOUNTER — Other Ambulatory Visit: Payer: Self-pay | Admitting: *Deleted

## 2018-01-02 NOTE — Patient Outreach (Signed)
[facility]) Care Management  01/02/2018  Joel [name] 31-May-1932 [id number]   Call placed to member to follow up on current health status, no answer.  This is the 2nd unsuccessful outreach attempt.  Unsuccessful outreach letter sent, will follow up within the next 4 business days.   [name], BSN, [place] Management  [facility] Care Manager [phone]

## 2018-01-06 ENCOUNTER — Other Ambulatory Visit: Payer: Self-pay | Admitting: *Deleted

## 2018-01-06 NOTE — Patient Outreach (Signed)
[facility]) Care Management  01/06/2018  Joel [name] Mar 04, 1932 [id number]   Call placed to member to follow up on current health status.  He report he fell twice within the last week.  Once on Thanksgiving night and again that Friday.  Denies any loss of consciousness, but now complaint of right arm pain, unable to raise above shoulder.  State his home health nurse was aware of fall and reportedly contacted primary MD office, but he has not had a follow up office visit.    Report abdominal wound now closed, state home health nurse told him he no longer will need bandages covering.  Still following up with wound clinic, next appointment Monday.  Report home health nurse will be back on Wednesday.     State he has been compliant with medications.  Pharmacist from MD office made home visit, using last pill box that was filled this week.  Call placed to MD office to report fall and request appointment.  Message left for MD's nurse.  Member made aware that office will either call him or this care manager back to schedule visit.    Member agrees to home visit/assessment next week.  Denies any urgent concerns.  [name], [place], MSN [place] [phone]

## 2018-01-12 ENCOUNTER — Other Ambulatory Visit: Payer: Self-pay

## 2018-01-12 DIAGNOSIS — D500 Iron deficiency anemia secondary to blood loss (chronic): Secondary | ICD-10-CM | POA: Diagnosis not present

## 2018-01-12 DIAGNOSIS — I700 Atherosclerosis of aorta: Secondary | ICD-10-CM | POA: Diagnosis not present

## 2018-01-12 DIAGNOSIS — E669 Obesity, unspecified: Secondary | ICD-10-CM | POA: Diagnosis not present

## 2018-01-12 DIAGNOSIS — Z6833 Body mass index (BMI) 33.0-33.9, adult: Secondary | ICD-10-CM | POA: Diagnosis not present

## 2018-01-12 DIAGNOSIS — K922 Gastrointestinal hemorrhage, unspecified: Secondary | ICD-10-CM | POA: Diagnosis not present

## 2018-01-12 DIAGNOSIS — Z483 Aftercare following surgery for neoplasm: Secondary | ICD-10-CM | POA: Diagnosis not present

## 2018-01-12 DIAGNOSIS — I2510 Atherosclerotic heart disease of native coronary artery without angina pectoris: Secondary | ICD-10-CM | POA: Diagnosis not present

## 2018-01-12 NOTE — Patient Outreach (Addendum)
Joel [name]) Care Management   01/12/2018  Joel [name] 1932-11-08 [id number]  Joel [name] is an 84 y.o. male  Subjective: Member alert and oriented x4. Denies pain. Denies mobile meals.   Objective:   Review of Systems  Constitutional: Positive for chills.  HENT: Positive for congestion.   Eyes: Negative.   Respiratory: Positive for cough.   Cardiovascular: Positive for leg swelling.  Genitourinary: Positive for urgency.  Musculoskeletal: Positive for falls.  Skin: Negative.   Neurological: Positive for tingling.  Endo/Heme/Allergies: Bruises/bleeds easily.  Psychiatric/Behavioral: Positive for depression.    Physical Exam  Constitutional: He is oriented to person, place, and time. He appears well-developed and well-nourished.  Neck: Normal range of motion.  Cardiovascular: Regular rhythm.  Regular irregular rhythm. PMH Afib  Respiratory: Effort normal and breath sounds normal.  GI: Soft. Bowel sounds are normal.  Musculoskeletal: Normal range of motion.        General: Edema present.     Comments: Bilateral ankle edema 1+ and-non pitting  Neurological: He is alert and oriented to person, place, and time.  Skin: Skin is warm and dry.  Psychiatric: He has a normal mood and affect. His behavior is normal. Judgment and thought content normal.    BP 128/62 (BP Location: Left Arm, Patient Position: Sitting, Cuff Size: Normal)   Pulse 80 Comment: rate 80-110  Resp 20   SpO2 94%   Encounter Medications:   Outpatient Encounter Medications as of 01/12/2018  Medication Sig  . allopurinol (ZYLOPRIM) 100 MG tablet Take 100 mg by mouth daily.   . Ascorbic Acid (VITAMIN C PO) Take 1,000 mg by mouth daily.   Marland Kitchen buPROPion (WELLBUTRIN XL) 150 MG 24 hr tablet Take 1 tablet (150 mg total) by mouth daily.  . Cholecalciferol 5000 units TABS Take 1 tablet by mouth daily.  . citalopram (CELEXA) 20 MG tablet Take 1 tablet (20 mg total) by mouth daily.  . diclofenac  sodium (VOLTAREN) 1 % GEL Apply 1 application topically 4 (four) times daily. Pt uses once daily  . diltiazem (CARDIZEM CD) 120 MG 24 hr capsule Take 1 capsule (120 mg total) by mouth daily.  . fluticasone (FLONASE) 50 MCG/ACT nasal spray Place 2 sprays into both nostrils daily. (Patient taking differently: Place 2 sprays into both nostrils daily as needed for allergies or rhinitis. )  . furosemide (LASIX) 80 MG tablet Take 1 tablet (80 mg total) by mouth daily.  Marland Kitchen gabapentin (NEURONTIN) 100 MG capsule Take 2 capsules (200 mg total) by mouth 2 (two) times daily.  Marland Kitchen glimepiride (AMARYL) 1 MG tablet Take 1 mg by mouth daily with breakfast.   . guaifenesin (HUMIBID E) 400 MG TABS tablet Take 400 mg by mouth every 12 (twelve) hours as needed (for congestion, OTC).  Marland Kitchen lovastatin (MEVACOR) 20 MG tablet Take 20 mg by mouth at bedtime.   . meclizine (ANTIVERT) 25 MG tablet Take 1 tablet (25 mg total) by mouth 3 (three) times daily as needed for dizziness. (Patient taking differently: Take 25 mg by mouth at bedtime. )  . Multiple Vitamins-Minerals (VISION FORMULA PO) Take 1 tablet by mouth daily.   . OXYGEN Inhale 2 L into the lungs as needed (shortness of breath).   . pantoprazole (PROTONIX) 40 MG tablet Take 1 tablet (40 mg total) by mouth 2 (two) times daily.  . potassium chloride SA (K-DUR,KLOR-CON) 20 MEQ tablet Take 20 mEq by mouth daily.   . sodium chloride (OCEAN) 0.65 % SOLN  nasal spray Place 1 spray into both nostrils as needed for congestion.  . temazepam (RESTORIL) 15 MG capsule Take 1 capsule (15 mg total) by mouth at bedtime.  . B Complex-C (B-COMPLEX WITH VITAMIN C) tablet Take 1 tablet by mouth daily. (Patient not taking: Reported on 01/12/2018)  . iron polysaccharides (NIFEREX) 150 MG capsule Take 1 capsule (150 mg total) by mouth 2 (two) times daily. (Patient not taking: Reported on 01/12/2018)  . metFORMIN (GLUCOPHAGE) 1000 MG tablet Take 1,000 mg by mouth 2 (two) times daily.    No  facility-administered encounter medications on file as of 01/12/2018.     Functional Status:   In your present state of health, do you have any difficulty performing the following activities: 09/28/2017 08/30/2017  Hearing? Joel [name]? N -  Difficulty concentrating or making decisions? N -  Walking or climbing stairs? Y -  Dressing or bathing? N -  Doing errands, shopping? Joel [name]  Preparing Food and eating ? N -  Using the Toilet? N -  In the past six months, have you accidently leaked urine? N -  Do you have problems with loss of bowel control? N -  Managing your Medications? N -  Managing your Finances? N -  Housekeeping or managing your Housekeeping? Y -  Some recent data might be hidden    Fall/Depression Screening:    Fall Risk  09/28/2017 03/29/2017 07/14/2016  Falls in the past year? Yes Yes No  Number falls in past yr: 1 1 -  Injury with Fall? Yes Yes -  Risk Factor Category  High Fall Risk - -  Risk for fall due to : History of fall(s) Impaired balance/gait;Impaired mobility -  Follow up Education provided Education provided;Follow up appointment;Falls prevention discussed -   PHQ 2/9 Scores 09/28/2017 03/29/2017 07/14/2016 03/30/2016 01/07/2016 01/07/2016  PHQ - 2 Score 1 0 0 0 0 0    Assessment:  Visited with member in the home for Routine Visit and to assist with medication management. Member stated that he has not taken his medications in 3 days due to he received a new delivery of medications which caused member to be confused of which medications to take as member had multiple pill bottles. Assisted member with placing medications in pill box for 1 month supply. Educated member on s/s of adverse effects when stopping Citalopram abruptly. Encouraged member to take Citalopram and Cardizem today and member will assist self with bedtime medications.  Assessed abdominal wound: clean and dry with Mepilex dressing. Member states Joel [name] assists with wound care and states he  has Male [name] who assists with bathing.  Called member's PCP office to request post fall follow up and left voice mail message for Joel [name] requesting return call.  Plan:  Will follow up with member's PCP on Monday 01-16-2018 if no return call prior to date.   Will follow up next month for medication adherence and management. Will follow up with pharmacy concerning medications delivered in bubble packs.  [name], RN-BSN  [facility] Care Management  Community Care Management Coordinator  [phone] [email or web address]   THN CM Care Plan Problem One     Most Recent Value  Care Plan Problem One  Knowledge deficit related to new diagnosis of colon cancer as evidenced by recent hospital admission  Role Documenting the Problem One  Care Management [place] for Problem One  Active  THN CM Short Term Goal #2   Member will  report compliance with medications as prescribed over the next 4 weeks  THN CM Short Term Goal #2 Start Date  01/12/18 [Goal not met, date reset]  Interventions for Short Term Goal #2  Assisted member with placing medications in pill box,  Educated member on importance of taking medications as prescribed. Educated member on side effects of stopping Citalapram abruptly    [name] CM Care Plan Problem Two     Most Recent Value  Care Plan Problem Two  Knowledge deficit related to diagnosis of colon cancer  Role Documenting the Problem Two  Care Management Coordinator  Care Plan for Problem Two  Not Active  THN Long Term Goal  Member will report continued wound healing over the next 6 weeks  THN Long Term Goal Start Date  11/16/17  [facility] Long Term Goal Met Date  01/12/18  [name] CM Short Term Goal #1   Member will not report any signs/symptoms of infection within the next 4 weeks  THN CM Short Term Goal #1 Start Date  11/16/17  [name] CM Short Term Goal #1 Met Date   01/12/18  Interventions for Short Term Goal #2   Evaluated abdominal wound

## 2018-01-17 ENCOUNTER — Other Ambulatory Visit: Payer: Self-pay

## 2018-01-17 DIAGNOSIS — S72001S Fracture of unspecified part of neck of right femur, sequela: Secondary | ICD-10-CM | POA: Diagnosis not present

## 2018-01-17 DIAGNOSIS — N189 Chronic kidney disease, unspecified: Secondary | ICD-10-CM | POA: Diagnosis not present

## 2018-01-17 DIAGNOSIS — I700 Atherosclerosis of aorta: Secondary | ICD-10-CM | POA: Diagnosis not present

## 2018-01-17 DIAGNOSIS — G8929 Other chronic pain: Secondary | ICD-10-CM | POA: Diagnosis not present

## 2018-01-17 DIAGNOSIS — D631 Anemia in chronic kidney disease: Secondary | ICD-10-CM | POA: Diagnosis not present

## 2018-01-17 DIAGNOSIS — D500 Iron deficiency anemia secondary to blood loss (chronic): Secondary | ICD-10-CM | POA: Diagnosis not present

## 2018-01-17 DIAGNOSIS — K922 Gastrointestinal hemorrhage, unspecified: Secondary | ICD-10-CM | POA: Diagnosis not present

## 2018-01-17 DIAGNOSIS — Z483 Aftercare following surgery for neoplasm: Secondary | ICD-10-CM | POA: Diagnosis not present

## 2018-01-17 DIAGNOSIS — F419 Anxiety disorder, unspecified: Secondary | ICD-10-CM | POA: Diagnosis not present

## 2018-01-17 DIAGNOSIS — E785 Hyperlipidemia, unspecified: Secondary | ICD-10-CM | POA: Diagnosis not present

## 2018-01-17 DIAGNOSIS — S21209D Unspecified open wound of unspecified back wall of thorax without penetration into thoracic cavity, subsequent encounter: Secondary | ICD-10-CM | POA: Diagnosis not present

## 2018-01-17 DIAGNOSIS — L89152 Pressure ulcer of sacral region, stage 2: Secondary | ICD-10-CM | POA: Diagnosis not present

## 2018-01-17 DIAGNOSIS — I509 Heart failure, unspecified: Secondary | ICD-10-CM | POA: Diagnosis not present

## 2018-01-17 DIAGNOSIS — I350 Nonrheumatic aortic (valve) stenosis: Secondary | ICD-10-CM | POA: Diagnosis not present

## 2018-01-17 DIAGNOSIS — I2510 Atherosclerotic heart disease of native coronary artery without angina pectoris: Secondary | ICD-10-CM | POA: Diagnosis not present

## 2018-01-17 DIAGNOSIS — M25551 Pain in right hip: Secondary | ICD-10-CM | POA: Diagnosis not present

## 2018-01-17 DIAGNOSIS — L89610 Pressure ulcer of right heel, unstageable: Secondary | ICD-10-CM | POA: Diagnosis not present

## 2018-01-17 DIAGNOSIS — I4819 Other persistent atrial fibrillation: Secondary | ICD-10-CM | POA: Diagnosis not present

## 2018-01-17 DIAGNOSIS — J439 Emphysema, unspecified: Secondary | ICD-10-CM | POA: Diagnosis not present

## 2018-01-17 DIAGNOSIS — E669 Obesity, unspecified: Secondary | ICD-10-CM | POA: Diagnosis not present

## 2018-01-17 DIAGNOSIS — E1122 Type 2 diabetes mellitus with diabetic chronic kidney disease: Secondary | ICD-10-CM | POA: Diagnosis not present

## 2018-01-17 DIAGNOSIS — Z6833 Body mass index (BMI) 33.0-33.9, adult: Secondary | ICD-10-CM | POA: Diagnosis not present

## 2018-01-17 DIAGNOSIS — I272 Pulmonary hypertension, unspecified: Secondary | ICD-10-CM | POA: Diagnosis not present

## 2018-01-17 DIAGNOSIS — I130 Hypertensive heart and chronic kidney disease with heart failure and stage 1 through stage 4 chronic kidney disease, or unspecified chronic kidney disease: Secondary | ICD-10-CM | POA: Diagnosis not present

## 2018-01-17 DIAGNOSIS — F319 Bipolar disorder, unspecified: Secondary | ICD-10-CM | POA: Diagnosis not present

## 2018-01-17 NOTE — Patient Outreach (Signed)
[facility]) Care Management  01/17/2018  Joel [name] 1932-08-07 [id number]  Called member in order to assess member's status and HIPPA Compliant voicemail message left.   Attempted to call PCP to request PCP to reach out to member; however, voice mail message stating office closed.  Will follow up with 3-4 business days.  [name], RN-BSN  [facility] Care Management  Community Care Management Coordinator  [phone] [email or web address]

## 2018-01-20 ENCOUNTER — Other Ambulatory Visit: Payer: PPO

## 2018-01-20 NOTE — Patient Outreach (Signed)
[facility]) Care Management  01/20/2018  Joel [name] [birth year] [id number]   2nd Attempt to call member for post home visit follow up regarding medication adherence; however, no answer and HIPPA Compliant voicemail message left requesting return call.   Called member's daughter Joel [name], [place] preferred contact. Identity verified. Introduced self and stated reason for call. [name] stated that member had lost his phone and phone was found yesterday and stated she will call member and ask member to return call. Awaiting call back from member. Unsuccessful Outreach Letter sent.   Will follow up with member within 3-4 business days.   [name], RN-BSN  [facility] Care Management  Community Care Management Coordinator  [phone] [email or web address]

## 2018-01-23 ENCOUNTER — Emergency Department (HOSPITAL_COMMUNITY): Payer: PPO

## 2018-01-23 ENCOUNTER — Encounter (HOSPITAL_COMMUNITY): Payer: Self-pay

## 2018-01-23 ENCOUNTER — Inpatient Hospital Stay (HOSPITAL_COMMUNITY)
Admission: EM | Admit: 2018-01-23 | Discharge: 2018-01-27 | DRG: 291 | Disposition: A | Discharge status: Skilled Nursing Facility | Payer: PPO | Attending: Internal Medicine | Admitting: Internal Medicine

## 2018-01-23 ENCOUNTER — Other Ambulatory Visit: Payer: Self-pay

## 2018-01-23 DIAGNOSIS — E785 Hyperlipidemia, unspecified: Secondary | ICD-10-CM | POA: Diagnosis not present

## 2018-01-23 DIAGNOSIS — R296 Repeated falls: Secondary | ICD-10-CM | POA: Diagnosis present

## 2018-01-23 DIAGNOSIS — X58XXXS Exposure to other specified factors, sequela: Secondary | ICD-10-CM

## 2018-01-23 DIAGNOSIS — M109 Gout, unspecified: Secondary | ICD-10-CM | POA: Diagnosis present

## 2018-01-23 DIAGNOSIS — R0902 Hypoxemia: Secondary | ICD-10-CM | POA: Diagnosis present

## 2018-01-23 DIAGNOSIS — E119 Type 2 diabetes mellitus without complications: Secondary | ICD-10-CM

## 2018-01-23 DIAGNOSIS — I4891 Unspecified atrial fibrillation: Secondary | ICD-10-CM | POA: Diagnosis not present

## 2018-01-23 DIAGNOSIS — Z6835 Body mass index (BMI) 35.0-35.9, adult: Secondary | ICD-10-CM

## 2018-01-23 DIAGNOSIS — E11628 Type 2 diabetes mellitus with other skin complications: Secondary | ICD-10-CM | POA: Diagnosis not present

## 2018-01-23 DIAGNOSIS — M25551 Pain in right hip: Secondary | ICD-10-CM | POA: Diagnosis present

## 2018-01-23 DIAGNOSIS — Z993 Dependence on wheelchair: Secondary | ICD-10-CM

## 2018-01-23 DIAGNOSIS — Z79899 Other long term (current) drug therapy: Secondary | ICD-10-CM

## 2018-01-23 DIAGNOSIS — G4733 Obstructive sleep apnea (adult) (pediatric): Secondary | ICD-10-CM | POA: Diagnosis present

## 2018-01-23 DIAGNOSIS — Y929 Unspecified place or not applicable: Secondary | ICD-10-CM

## 2018-01-23 DIAGNOSIS — I480 Paroxysmal atrial fibrillation: Secondary | ICD-10-CM | POA: Diagnosis not present

## 2018-01-23 DIAGNOSIS — J90 Pleural effusion, not elsewhere classified: Secondary | ICD-10-CM | POA: Diagnosis not present

## 2018-01-23 DIAGNOSIS — T24212A Burn of second degree of left thigh, initial encounter: Secondary | ICD-10-CM | POA: Diagnosis not present

## 2018-01-23 DIAGNOSIS — B9689 Other specified bacterial agents as the cause of diseases classified elsewhere: Secondary | ICD-10-CM | POA: Diagnosis not present

## 2018-01-23 DIAGNOSIS — S91201A Unspecified open wound of right great toe with damage to nail, initial encounter: Secondary | ICD-10-CM | POA: Diagnosis present

## 2018-01-23 DIAGNOSIS — I110 Hypertensive heart disease with heart failure: Principal | ICD-10-CM | POA: Diagnosis present

## 2018-01-23 DIAGNOSIS — Z7984 Long term (current) use of oral hypoglycemic drugs: Secondary | ICD-10-CM

## 2018-01-23 DIAGNOSIS — F319 Bipolar disorder, unspecified: Secondary | ICD-10-CM | POA: Diagnosis not present

## 2018-01-23 DIAGNOSIS — X100XXA Contact with hot drinks, initial encounter: Secondary | ICD-10-CM | POA: Diagnosis present

## 2018-01-23 DIAGNOSIS — I509 Heart failure, unspecified: Secondary | ICD-10-CM

## 2018-01-23 DIAGNOSIS — E662 Morbid (severe) obesity with alveolar hypoventilation: Secondary | ICD-10-CM | POA: Diagnosis not present

## 2018-01-23 DIAGNOSIS — C189 Malignant neoplasm of colon, unspecified: Secondary | ICD-10-CM | POA: Diagnosis not present

## 2018-01-23 DIAGNOSIS — M25561 Pain in right knee: Secondary | ICD-10-CM | POA: Diagnosis present

## 2018-01-23 DIAGNOSIS — L89323 Pressure ulcer of left buttock, stage 3: Secondary | ICD-10-CM | POA: Diagnosis not present

## 2018-01-23 DIAGNOSIS — L03116 Cellulitis of left lower limb: Secondary | ICD-10-CM | POA: Diagnosis not present

## 2018-01-23 DIAGNOSIS — J449 Chronic obstructive pulmonary disease, unspecified: Secondary | ICD-10-CM | POA: Diagnosis not present

## 2018-01-23 DIAGNOSIS — R2689 Other abnormalities of gait and mobility: Secondary | ICD-10-CM | POA: Diagnosis not present

## 2018-01-23 DIAGNOSIS — L03031 Cellulitis of right toe: Secondary | ICD-10-CM | POA: Diagnosis not present

## 2018-01-23 DIAGNOSIS — Z9111 Patient's noncompliance with dietary regimen: Secondary | ICD-10-CM

## 2018-01-23 DIAGNOSIS — L89329 Pressure ulcer of left buttock, unspecified stage: Secondary | ICD-10-CM | POA: Diagnosis not present

## 2018-01-23 DIAGNOSIS — Z9181 History of falling: Secondary | ICD-10-CM | POA: Diagnosis not present

## 2018-01-23 DIAGNOSIS — Z886 Allergy status to analgesic agent status: Secondary | ICD-10-CM

## 2018-01-23 DIAGNOSIS — C182 Malignant neoplasm of ascending colon: Secondary | ICD-10-CM | POA: Diagnosis present

## 2018-01-23 DIAGNOSIS — L03119 Cellulitis of unspecified part of limb: Secondary | ICD-10-CM | POA: Diagnosis not present

## 2018-01-23 DIAGNOSIS — X58XXXA Exposure to other specified factors, initial encounter: Secondary | ICD-10-CM | POA: Diagnosis present

## 2018-01-23 DIAGNOSIS — J9611 Chronic respiratory failure with hypoxia: Secondary | ICD-10-CM | POA: Diagnosis not present

## 2018-01-23 DIAGNOSIS — Z87898 Personal history of other specified conditions: Secondary | ICD-10-CM

## 2018-01-23 DIAGNOSIS — I10 Essential (primary) hypertension: Secondary | ICD-10-CM | POA: Diagnosis not present

## 2018-01-23 DIAGNOSIS — Z91128 Patient's intentional underdosing of medication regimen for other reason: Secondary | ICD-10-CM

## 2018-01-23 DIAGNOSIS — S72011S Unspecified intracapsular fracture of right femur, sequela: Secondary | ICD-10-CM

## 2018-01-23 DIAGNOSIS — H8110 Benign paroxysmal vertigo, unspecified ear: Secondary | ICD-10-CM | POA: Diagnosis not present

## 2018-01-23 DIAGNOSIS — Z85038 Personal history of other malignant neoplasm of large intestine: Secondary | ICD-10-CM | POA: Diagnosis not present

## 2018-01-23 DIAGNOSIS — Z7401 Bed confinement status: Secondary | ICD-10-CM | POA: Diagnosis not present

## 2018-01-23 DIAGNOSIS — E1140 Type 2 diabetes mellitus with diabetic neuropathy, unspecified: Secondary | ICD-10-CM | POA: Diagnosis present

## 2018-01-23 DIAGNOSIS — S79911A Unspecified injury of right hip, initial encounter: Secondary | ICD-10-CM | POA: Diagnosis not present

## 2018-01-23 DIAGNOSIS — B353 Tinea pedis: Secondary | ICD-10-CM | POA: Diagnosis present

## 2018-01-23 DIAGNOSIS — E669 Obesity, unspecified: Secondary | ICD-10-CM | POA: Diagnosis not present

## 2018-01-23 DIAGNOSIS — Z9049 Acquired absence of other specified parts of digestive tract: Secondary | ICD-10-CM

## 2018-01-23 DIAGNOSIS — T501X6A Underdosing of loop [high-ceiling] diuretics, initial encounter: Secondary | ICD-10-CM | POA: Diagnosis present

## 2018-01-23 DIAGNOSIS — E1169 Type 2 diabetes mellitus with other specified complication: Secondary | ICD-10-CM | POA: Diagnosis not present

## 2018-01-23 DIAGNOSIS — R278 Other lack of coordination: Secondary | ICD-10-CM | POA: Diagnosis not present

## 2018-01-23 DIAGNOSIS — I5033 Acute on chronic diastolic (congestive) heart failure: Secondary | ICD-10-CM | POA: Diagnosis present

## 2018-01-23 DIAGNOSIS — Z87891 Personal history of nicotine dependence: Secondary | ICD-10-CM

## 2018-01-23 DIAGNOSIS — Z888 Allergy status to other drugs, medicaments and biological substances status: Secondary | ICD-10-CM

## 2018-01-23 DIAGNOSIS — Z880 Allergy status to penicillin: Secondary | ICD-10-CM | POA: Diagnosis not present

## 2018-01-23 DIAGNOSIS — M6281 Muscle weakness (generalized): Secondary | ICD-10-CM | POA: Diagnosis not present

## 2018-01-23 DIAGNOSIS — E7849 Other hyperlipidemia: Secondary | ICD-10-CM | POA: Diagnosis not present

## 2018-01-23 DIAGNOSIS — J410 Simple chronic bronchitis: Secondary | ICD-10-CM | POA: Diagnosis not present

## 2018-01-23 DIAGNOSIS — E11621 Type 2 diabetes mellitus with foot ulcer: Secondary | ICD-10-CM | POA: Diagnosis not present

## 2018-01-23 DIAGNOSIS — G8929 Other chronic pain: Secondary | ICD-10-CM

## 2018-01-23 DIAGNOSIS — N492 Inflammatory disorders of scrotum: Secondary | ICD-10-CM

## 2018-01-23 DIAGNOSIS — Z8546 Personal history of malignant neoplasm of prostate: Secondary | ICD-10-CM

## 2018-01-23 DIAGNOSIS — M2550 Pain in unspecified joint: Secondary | ICD-10-CM | POA: Diagnosis not present

## 2018-01-23 LAB — CBC WITH DIFFERENTIAL/PLATELET
Abs Immature Granulocytes: 0.01 10*3/uL (ref 0.00–0.07)
Basophils Absolute: 0 10*3/uL (ref 0.0–0.1)
Basophils Relative: 1 %
Eosinophils Absolute: 0.3 10*3/uL (ref 0.0–0.5)
Eosinophils Relative: 6 %
HEMATOCRIT: 32.6 % — AB (ref 39.0–52.0)
Hemoglobin: 9 g/dL — ABNORMAL LOW (ref 13.0–17.0)
Immature Granulocytes: 0 %
LYMPHS ABS: 0.4 10*3/uL — AB (ref 0.7–4.0)
Lymphocytes Relative: 9 %
MCH: 23.6 pg — ABNORMAL LOW (ref 26.0–34.0)
MCHC: 27.6 g/dL — ABNORMAL LOW (ref 30.0–36.0)
MCV: 85.3 fL (ref 80.0–100.0)
MONO ABS: 0.5 10*3/uL (ref 0.1–1.0)
Monocytes Relative: 10 %
Neutro Abs: 3.4 10*3/uL (ref 1.7–7.7)
Neutrophils Relative %: 74 %
Platelets: 211 10*3/uL (ref 150–400)
RBC: 3.82 MIL/uL — ABNORMAL LOW (ref 4.22–5.81)
RDW: 16.1 % — ABNORMAL HIGH (ref 11.5–15.5)
WBC: 4.5 10*3/uL (ref 4.0–10.5)
nRBC: 0 % (ref 0.0–0.2)

## 2018-01-23 LAB — BASIC METABOLIC PANEL
Anion gap: 5 (ref 5–15)
BUN: 20 mg/dL (ref 8–23)
CO2: 26 mmol/L (ref 22–32)
Calcium: 8.7 mg/dL — ABNORMAL LOW (ref 8.9–10.3)
Chloride: 109 mmol/L (ref 98–111)
Creatinine, Ser: 1.16 mg/dL (ref 0.61–1.24)
GFR calc Af Amer: >60 mL/min (ref >60)
GFR calc non Af Amer: 58 mL/min — ABNORMAL LOW (ref >60)
GLUCOSE: 90 mg/dL (ref 70–99)
Potassium: 4.4 mmol/L (ref 3.5–5.1)
Sodium: 140 mmol/L (ref 135–145)

## 2018-01-23 LAB — URINALYSIS, ROUTINE W REFLEX MICROSCOPIC
BILIRUBIN URINE: NEGATIVE
GLUCOSE, UA: NEGATIVE mg/dL
HGB URINE DIPSTICK: NEGATIVE
Ketones, ur: NEGATIVE mg/dL
Leukocytes, UA: NEGATIVE
Nitrite: NEGATIVE
Protein, ur: NEGATIVE mg/dL
Specific Gravity, Urine: 1.02 (ref 1.005–1.030)
pH: 5 (ref 5.0–8.0)

## 2018-01-23 LAB — GLUCOSE, CAPILLARY: Glucose-Capillary: 159 mg/dL — ABNORMAL HIGH (ref 70–99)

## 2018-01-23 LAB — CBG MONITORING, ED: Glucose-Capillary: 103 mg/dL — ABNORMAL HIGH (ref 70–99)

## 2018-01-23 LAB — BRAIN NATRIURETIC PEPTIDE: B Natriuretic Peptide: 355.4 pg/mL — ABNORMAL HIGH (ref 0.0–100.0)

## 2018-01-23 LAB — TROPONIN I: Troponin I: <0.03 ng/mL (ref <0.03)

## 2018-01-23 MED ORDER — POTASSIUM CHLORIDE CRYS ER 20 MEQ PO TBCR
20.0000 meq | EXTENDED_RELEASE_TABLET | Freq: Every day | ORAL | Status: DC
  Administered 2018-01-24 – 2018-01-27 (×4): 20 meq via ORAL
  Filled 2018-01-23: qty 1
  Filled 2018-01-23: qty 2
  Filled 2018-01-23 (×2): qty 1

## 2018-01-23 MED ORDER — ACETAMINOPHEN 325 MG PO TABS
650.0000 mg | ORAL_TABLET | Freq: Four times a day (QID) | ORAL | Status: DC | PRN
  Administered 2018-01-23 – 2018-01-26 (×8): 650 mg via ORAL
  Filled 2018-01-23 (×8): qty 2

## 2018-01-23 MED ORDER — CLINDAMYCIN PHOSPHATE 600 MG/50ML IV SOLN
600.0000 mg | Freq: Once | INTRAVENOUS | Status: AC
  Administered 2018-01-23: 600 mg via INTRAVENOUS
  Filled 2018-01-23: qty 50

## 2018-01-23 MED ORDER — ONDANSETRON HCL 4 MG PO TABS: 4.0000 mg | ORAL_TABLET | Freq: Four times a day (QID) | ORAL | Status: DC | PRN

## 2018-01-23 MED ORDER — FUROSEMIDE 10 MG/ML IJ SOLN
40.0000 mg | Freq: Two times a day (BID) | INTRAMUSCULAR | Status: DC
  Administered 2018-01-23 – 2018-01-24 (×2): 40 mg via INTRAVENOUS
  Filled 2018-01-23 (×2): qty 4

## 2018-01-23 MED ORDER — IPRATROPIUM BROMIDE 0.02 % IN SOLN
0.5000 mg | Freq: Once | RESPIRATORY_TRACT | Status: AC
  Administered 2018-01-23: 0.5 mg via RESPIRATORY_TRACT
  Filled 2018-01-23: qty 2.5

## 2018-01-23 MED ORDER — CLINDAMYCIN PHOSPHATE 600 MG/50ML IV SOLN
600.0000 mg | Freq: Three times a day (TID) | INTRAVENOUS | Status: DC
  Administered 2018-01-23 – 2018-01-26 (×8): 600 mg via INTRAVENOUS
  Filled 2018-01-23 (×10): qty 50

## 2018-01-23 MED ORDER — ALBUTEROL SULFATE (2.5 MG/3ML) 0.083% IN NEBU
5.0000 mg | INHALATION_SOLUTION | Freq: Once | RESPIRATORY_TRACT | Status: AC
  Administered 2018-01-23: 5 mg via RESPIRATORY_TRACT
  Filled 2018-01-23: qty 6

## 2018-01-23 MED ORDER — POLYETHYLENE GLYCOL 3350 17 G PO PACK: 17.0000 g | PACK | Freq: Every day | ORAL | Status: DC | PRN

## 2018-01-23 MED ORDER — ACETAMINOPHEN 650 MG RE SUPP: 650.0000 mg | Freq: Four times a day (QID) | RECTAL | Status: DC | PRN

## 2018-01-23 MED ORDER — ONDANSETRON HCL 4 MG/2ML IJ SOLN: 4.0000 mg | Freq: Four times a day (QID) | INTRAMUSCULAR | Status: DC | PRN

## 2018-01-23 MED ORDER — BACITRACIN ZINC 500 UNIT/GM EX OINT
TOPICAL_OINTMENT | Freq: Two times a day (BID) | CUTANEOUS | Status: DC
  Administered 2018-01-23: 1 via TOPICAL
  Filled 2018-01-23: qty 0.9

## 2018-01-23 MED ORDER — INSULIN ASPART 100 UNIT/ML ~~LOC~~ SOLN
0.0000 [IU] | Freq: Three times a day (TID) | SUBCUTANEOUS | Status: DC
  Administered 2018-01-24 – 2018-01-25 (×2): 1 [IU] via SUBCUTANEOUS
  Administered 2018-01-26 (×2): 2 [IU] via SUBCUTANEOUS
  Administered 2018-01-26: 1 [IU] via SUBCUTANEOUS

## 2018-01-23 MED ORDER — TEMAZEPAM 15 MG PO CAPS
15.0000 mg | ORAL_CAPSULE | Freq: Every evening | ORAL | Status: DC | PRN
  Administered 2018-01-23 – 2018-01-26 (×4): 15 mg via ORAL
  Filled 2018-01-23 (×4): qty 1

## 2018-01-23 MED ORDER — ZOLPIDEM TARTRATE 5 MG PO TABS: 5.0000 mg | ORAL_TABLET | Freq: Once | ORAL | Status: DC

## 2018-01-23 MED ORDER — ENOXAPARIN SODIUM 40 MG/0.4ML ~~LOC~~ SOLN
40.0000 mg | SUBCUTANEOUS | Status: DC
  Administered 2018-01-23 – 2018-01-26 (×3): 40 mg via SUBCUTANEOUS
  Filled 2018-01-23 (×4): qty 0.4

## 2018-01-23 NOTE — Evaluation (Signed)
Physical Therapy Evaluation Patient Details Name: Joel [name] MRN: [id number] DOB: [birth year] Today's Date: 01/23/2018   History of Present Illness  84 yo with significant medical history, colon cancer, L hip fracture, CAD, COPD, afib presents with increased weakness, shortness of breath, wound on his R toe and wound on his left inner thigh. Pt states he has been declining in general over the past year, has had several hositpalizations, ST-SNF and recnetly HHPT. With other medical inssues during this, pt feels he just has not had time to get fullyrecovered and strong to mange things indpendently safely at home.Marland Kitchen He has been ambulating a little on RW , but with 2 falls in last month, so mostly using a WC for safety in his home at this time.   Clinical Impression  Pt with multiple medical issues over the last year with a decline in physical ability as well  Presents with increased weakness, decreased ROM, decreased safety and independence with mobility to safely return home . Pt to benefit from ST-SNF for continued PT to increased safety ability with all mobility, as well as address his other medical issues (wounds, breathing with energy conservation techniques ). Will continue to see pt in acute care before discharge to next venue.     Follow Up Recommendations SNF    Equipment Recommendations  None recommended by PT    Recommendations for Other Services       Precautions / Restrictions Precautions Precautions: Fall(2 wihtin last 3 weeks )      Mobility  Bed Mobility Overal bed mobility: Needs Assistance(bnot tested pt sitting EOB , but as per nursing and pt needs assist at this time for supine to sit and sit to supine due to LE edema and scotal edema)                Transfers Overall transfer level: Needs assistance Equipment used: Rolling walker (2 wheeled) Transfers: Sit to/from Stand Sit to Stand: +2 physical assistance;Mod assist         General transfer comment:  static standing for about 10 seconds. Pain in R LE and knee and flexed R knee due to leg length decrepancy per pt . very diffucult for pt static standing at this time. SOB after for about 2 minutes with O2 sat 98%.  Ambulation/Gait Ambulation/Gait assistance: Mod assist;+2 physical assistance Gait Distance (Feet): 4 Feet Assistive device: Rolling walker (2 wheeled)       General Gait Details: only side stepped for safety to head of bed and limited due ot R knee pain and ED room set up. SOB after this little upright session upon sitting ofr about 2 minutes.   Stairs            Wheelchair Mobility    Modified Rankin (Stroke Patients Only)       Balance                                             Pertinent Vitals/Pain Pain Assessment: 0-10 Pain Score: 10-Worst pain ever Pain Location: states when he stands he has 10/10 pain in R knee and 9/10 in left knee.  Pain Descriptors / Indicators: Pressure;Sharp;Shooting Pain Intervention(s): Monitored during session;Limited activity within patient's tolerance    Home Living Family/patient expects to be discharged to:: [place]: Alone  Additional Comments: uses RW occassionaly in the home with HHPT and sometimes Independently, however has been using more WC in last few weeks due to weakness, knee pain and falls.     Prior Function Level of Independence: Independent with assistive device(s)         Comments: was using WC and RW , HHPT was helping him to use RW , but just didn't seem like enough rehab at the time and had some medical issues that slowed things down too.,     Hand Dominance        Extremity/Trunk Assessment        Lower Extremity Assessment Lower Extremity Assessment: RLE deficits/detail;LLE deficits/detail(both legs with redness, very tight with edema, open wound on L medial thigh from coffee burn per patient, and wound on R great  toe from fall over thanksgiving. Odorous smell and drainage. ) RLE Deficits / Details: knee flexion contracture at this time very swollen and very tight!! 30-40 degrees from full extension due to pain and swelling. LE strength general 4_/4  LLE Deficits / Details: also with swelling and tightness a little less than ri, and knee with only about 10 degree knee flexion contracture. and 4-/5 LE strength.        Communication   Communication: No difficulties  Cognition Arousal/Alertness: Awake/alert Behavior During Therapy: WFL for tasks assessed/performed Overall Cognitive Status: Within Functional Limits for tasks assessed                                        General Comments      Exercises     Assessment/Plan    PT Assessment Patient needs continued PT services  PT Problem List Decreased strength;Decreased range of motion;Decreased activity tolerance;Decreased mobility       PT Treatment Interventions Gait training;Functional mobility training;Therapeutic activities;Therapeutic exercise;Patient/family education    PT Goals (Current goals can be found in the Care Plan section)  Acute Rehab PT Goals Patient Stated Goal: " I want to be able to stay in my home as long as i can , but know I need some rehab before i can do so. I am willing o work hard to get stronger and able again" PT Goal Formulation: With patient Time For Goal Achievement: 02/06/18 Potential to Achieve Goals: Good    Frequency Min 2X/week   Barriers to discharge Decreased caregiver support      Co-evaluation               AM-PAC PT "6 Clicks" Mobility  Outcome Measure Help needed turning from your back to your side while in a flat bed without using bedrails?: A Lot Help needed moving from lying on your back to sitting on the side of a flat bed without using bedrails?: A Lot Help needed moving to and from a bed to a chair (including a wheelchair)?: A Lot Help needed standing up from  a chair using your arms (e.g., wheelchair or bedside chair)?: A Lot Help needed to walk in hospital room?: A Lot Help needed climbing 3-5 steps with a railing? : Total 6 Click Score: 11    End of Session   Activity Tolerance: Patient limited by pain Patient left: in bed;with family/visitor present Nurse Communication: Mobility status PT Visit Diagnosis: Other abnormalities of gait and mobility (R26.89);Muscle weakness (generalized) (M62.81);Repeated falls (R29.6)    Time: 1700-1731 PT Time Calculation (min) (ACUTE ONLY):  31 min   Charges:   PT Evaluation $PT Eval Low Complexity: 1 Low PT Treatments $Therapeutic Activity: 8-22 mins       Clide Dales, PT Acute Rehabilitation Services Pager: [phone] Office: [phone] 01/23/2018   Clide Dales 01/23/2018, 6:13 PM

## 2018-01-23 NOTE — Clinical Social Work Note (Signed)
Clinical Social Work Assessment  Patient Details  Name: Joel [name] MRN: [id number] Date of Birth: March 30, 1932  Date of referral:  01/23/18               Reason for consult:  Facility Placement                Permission sought to share information with:  Facility Art therapist granted to share information::  Yes, Verbal Permission Granted  Name::        Agency::     Relationship::     Contact Information:     Housing/Transportation Living arrangements for the past 2 months:    Source of Information:  Patient, Adult Children Patient Interpreter Needed:  None Criminal Activity/Legal Involvement Pertinent to Current Situation/Hospitalization:    Significant Relationships:  Adult Children Lives with:  Self Do you feel safe going back to the place where you live?  No Need for family participation in patient care:  Yes (Comment) Care giving concerns:  CSW met with EDP, RNCM and reviewed chart.  CSW met with pt who stated pt was at [facility] for 28 days after being D/C'd following an inpatient stay in February.  Pt/pt's daughter stated pt was operated on for colon cancer in August 2019 and refused SNF and has not fully recuperated since making it increasingly difficult to walk.  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  Pt stated pt has used a wheelchair since February.  Pt/Pt's daughter stated pt had made some progress due to PT in the home and as of late the pt has not been progressing and that the shot they gave me "in my knees has worn off making my knees very painful".  Pt was seen by the attending ED :Physician as well as the pt's EPD and is being considered for inpatient admission due to cellulitis.  Pt has diabetes and pt's great toe is infected, per the chart.  Pt stated pt would like short-term rehab as pt cannot care for himself at home due to pt's inability to walk and pt's daughter and pt  stated pt has gained 30 pounds and pt's daughter and son cannot get pt up, especially if pt were to fall.  Pt has had two recent falls, per the pt/pt's daughter.  Pt gave verbal permission for the CSW to speak to [facility] about possible placement options and to begin an authorization and pt/pt's daughter provided verbal permission for the CSW to do so and also  To create and FL-2 and send referrals out on the pt's behalf.  RN CM placed PT consult and CSW to spoke to PT who stated they would attempt to see the pt tonight.  CSW spoke to [employer] who stated the hospitalist's name and/or the PT recommendation would be necessary to begin the authorization and that the [employer] on-call person would be available until 8pm.  CSW confirmed the hospitalist is Dr. Denton [name].  CSW spoke to Joel [name], the on-call rep at [facility] who stated pt's name would be given to the SNF bed liason with [employer] so that they would be aware pt's request for auth would likely be made on 12/24.  Joel [name] with [employer] stated that she would be available on 12/23 and 12/24 at ph: [phone] once the PT consult is completed and when there is a PT recommendation for SNF to begin the auth for a possible SNF  stay.  Joel [name] stated the PT recommendation would be needed first.  CSW spoke to Joel [name] who is on call at [facility] at ph: [phone] and provided the hospitalist's name and PT recommendation for SNF, as directed and Joel [name] stated she will begin the authorization for the pt now.   Social Worker assessment / plan:  CSW met with pt and pt's daughter Joel [name] at ph: [phone] and confirmed pt's plan to be discharged to SNF to live at discharge.  CSW provided active listening and validated pt's concerns.   CSW DEPT was given verbal permission by py/pt's daughter to complete FL-2 and send referrals out to SNF facilities in the [place] area, but states [facility] is the pt's/pt's daughter's first preference.  Pt/pt's  daughter understands after speaking to the CSW that if pt is up for D/C the pt would have to possibly go home to await an open bed at [facility] and pt's daughter states pt would be willing to go to a different facility until [facility] has an open bed.  Pt's daughter and pt would like pt's daughter to be the contact person and to be involved in all decisions regarding D/C.  Pt has been living independently prior to being admitted to [facility].  Employment status:    Insurance informationEducational psychologist PT Recommendations:  [employer] / Referral to community resources:       Patient/Family's Response to care:  Patient alert and oriented.  Patient and pt's daughter agreeable to plan.  Pt's daughter supportive and strongly involved in pt.'s care.  Pt.'s daughter pleasant and appreciated CSW intervention.    Patient/Family's Understanding of and Emotional Response to Diagnosis, Current Treatment, and Prognosis:  Still assessing  Emotional Assessment Appearance:  Appears stated age Attitude/Demeanor/Rapport:    Affect (typically observed):  Afraid/Fearful, Frustrated, Restless Orientation:  Oriented to Self, Oriented to Situation, Oriented to Place, Oriented to  Time Alcohol / Substance use:    Psych involvement (Current and /or in the community):     Discharge Needs  Concerns to be addressed:  No discharge needs identified Readmission within the last 30 days:  No Current discharge risk:  None Barriers to Discharge:  No Barriers Identified   Claudine Mouton, LCSWA 01/23/2018, 7:04 PM

## 2018-01-23 NOTE — ED Triage Notes (Signed)
Patient c/o urinary rentention. C/o groin swelling.  2/10 pain in groin  Denies abdominal pain   Patient in wheelchair and has not been able to ambulate without assistance for a year.   A/Ox4

## 2018-01-23 NOTE — Progress Notes (Signed)
Paged on call provider, Lamar Blinks, NP for prn neb treatments per RT recommendation. Pt c/o SOB and wheezing audible from doorway. Pt assisted into tripod position, but still symptomatic. RT at bedside awaiting orders.

## 2018-01-23 NOTE — Progress Notes (Signed)
CSW spoke to Amy, the on-call rep at [facility] who stated pt's name would be given to the SNF bed liason with [employer] so that they would be aware pt's request for auth would likely be made on 12/24.  Amy with [employer] stated that she would be available on 12/23 and 12/24 at ph: [phone] once the PT consult is completed and when there is a PT recommendation for SNF to begin the auth for a possible SNF stay.  Amy stated the PT recommendation would be needed first.  Of note: the hospitalist that accepted the pt for tx was Dr. Denton Brick.  CSW will continue to follow for D/C needs.  Alphonse Guild. Salimah Martinovich, LCSW, LCAS, CSI Clinical Social Worker Ph: [phone]

## 2018-01-23 NOTE — H&P (Addendum)
History and Physical    Joel [name] EXH:[id number] DOB: 1932/08/22 DOA: 01/23/2018  PCP: Wenda Low, MD   Patient coming from: Home  I have personally briefly reviewed patient's old medical records in [place]  Chief Complaint: Dyspnea on exertion, Swelling in scrotum, legs  HPI: Joel [name] is a 84 y.o. male with medical history significant for COPD, CHF, atrial fibrillation, OSA, colon cancer status post resection, DM, who presented to the ED with multiple complaints-reported swelling of his scrotum of 1 week duration with associated mild pain, also swelling of bilateral lower extremities progressively over the past week.  Also reports abdominal bloating.  He complains of difficulty breathing with mild exertion like transferring from car to wheelchair, sometimes requiring BiPAP during the day, progressive over the past 2 to 3 weeks.  He is on 2 L O2 as needed at home.  Patient spilled hot coffee on his left upper thigh a few days ago, sustaining a bruise that has been draining-clear fluid.  No Fever or chills.  Patient is he has been compliant with his 40 mg daily dose of Lasix, but about a month ago he is provider increased his Lasix to 80 mg, took this for 3 days but stopped because of increased frequency with urination.  Admits to dietary indiscretion.   ED Course: Intermittent tachycardia to 111.  O2 sats greater than 92 % on room air.  UA clean.  CBC within normal limits 4.5.  EKG atrial fibrillation.  Port chest x-ray-cardiomegaly with bilateral pulmonary interstitial prominence and bilateral pleural effusions consistent with CHF. Right foot x-ray, pelvic x-rays negative for acute abnormality.  It was given IV clindamycin and hospitalist was called to admit for cellulitis of scrotum and right foot.  Review of Systems: As per HPI all other systems reviewed and negative.  Past Medical History:  Diagnosis Date  . Allergic rhinitis   . Anemia   . Anxiety   . Aortic  stenosis 2012   mild to mod   . Atrial fibrillation ([place]) 1991   with multiple DCCV  . Bipolar affective disorder ([place])   . Cancer of ascending colon ([place]) 09/05/2017  . CHF (congestive heart failure) ([place]) 08/30/2017  . COPD (chronic obstructive pulmonary disease) ([place])   . Diabetic neuropathy ([place])    in feet  . Diabetic neuropathy ([place])   . Dyslipidemia   . Fatigue    last year or so  . Gout   . Hypertension   . Insomnia   . Obesity   . On home oxygen therapy 2 1/2 liters with bipap at night  . OSA (obstructive sleep apnea)    severe, uses bipap 16 1/2 by 12 or 13 setting  . Prostate cancer ([place])   . Rectal bleeding 12/16/2015  . Type 2 diabetes mellitus ([place])   . Vertigo     Past Surgical History:  Procedure Laterality Date  . BIOPSY  09/02/2017   Procedure: BIOPSY;  Surgeon: Ronald Lobo, MD;  Location: [facility];  Service: Endoscopy;;  . CARDIOVERSION  yrs ago   prior to 1998  . COLON RESECTION N/A 09/05/2017   Procedure: LAPAROSCOPIC RIGHT COLON RESECTION;  Surgeon: Fanny Skates, MD;  Location: [facility];  Service: General;  Laterality: N/A;  . COLONOSCOPY N/A 09/02/2017   Procedure: COLONOSCOPY;  Surgeon: Ronald Lobo, MD;  [facility];  Service: Endoscopy;  Laterality: N/A;  . COLONOSCOPY WITH PROPOFOL N/A 01/23/2013   Procedure: COLONOSCOPY WITH PROPOFOL;  Surgeon: Ursula Alert  Wynetta Emery, MD;  Location[facility];  Service: Endoscopy;  Laterality: N/A;  . electro shock  1969   for depression  . ENTEROSCOPY Left 05/17/2016   Procedure: ENTEROSCOPY;  Surgeon: Wilford Corner, MD;  [facility];  Service: Endoscopy;  Laterality: Left;  . ESOPHAGOGASTRODUODENOSCOPY N/A 03/03/2016   Procedure: ESOPHAGOGASTRODUODENOSCOPY (EGD);  Surgeon: Teena Irani, MD;  Location: [facility];  Service: Endoscopy;  Laterality: N/A;  . ESOPHAGOGASTRODUODENOSCOPY N/A 03/26/2016   Procedure: ESOPHAGOGASTRODUODENOSCOPY (EGD);  Surgeon: Teena Irani, MD;  Location[facility]  ENDOSCOPY;  Service: Endoscopy;  Laterality: N/A;  would like to use ultraslim scope  . ESOPHAGOGASTRODUODENOSCOPY N/A 08/31/2017   Procedure: ESOPHAGOGASTRODUODENOSCOPY (EGD);  Surgeon: Laurence Spates, MD;  Location: [facility];  Service: Endoscopy;  Laterality: N/A;  . ESOPHAGOGASTRODUODENOSCOPY (EGD) WITH PROPOFOL N/A 01/23/2013   Procedure: ESOPHAGOGASTRODUODENOSCOPY (EGD) WITH PROPOFOL;  Surgeon: Garlan Fair, MD;  [facility];  Service: Endoscopy;  Laterality: N/A;  . ESOPHAGOGASTRODUODENOSCOPY (EGD) WITH PROPOFOL N/A 04/13/2016   Procedure: ESOPHAGOGASTRODUODENOSCOPY (EGD) WITH PROPOFOL;  Surgeon: Otis Brace, MD;  Location: [place];  Service: Gastroenterology;  Laterality: N/A;  . GIVENS CAPSULE STUDY N/A 03/03/2016   Procedure: GIVENS CAPSULE STUDY;  Surgeon: Teena Irani, MD;  [facility];  Service: Endoscopy;  Laterality: N/A;  . HEMORRHOID SURGERY N/A 12/16/2015   Procedure: PROCTOSCOPY WITH CONTROL OF BLEEDING;  Surgeon: Fanny Skates, MD;  Location: [place];  Service: General;  Laterality: N/A;  . HIP PINNING,CANNULATED Right 03/23/2017   Procedure: CANNULATED HIP PINNING;  Surgeon: Marybelle Killings, MD;  [facility];  Service: Orthopedics;  Laterality: Right;  . KNEE SURGERY  1970's   rt  . LAPAROTOMY N/A 09/10/2017   Procedure: EXPLORATORY LAPAROTOMY, PARTIAL COLECTOMY;  Surgeon: Excell Seltzer, MD;  [facility];  Service: General;  Laterality: N/A;  . [place]  . POLYPECTOMY  09/02/2017   Procedure: POLYPECTOMY;  Surgeon: Ronald Lobo, MD;  [facility];  Service: Endoscopy;;  . PROSTATE BIOPSY    . SHOULDER SURGERY  7-8 yrs ago   rt     reports that he quit smoking about 41 years ago. His smoking use included cigarettes. He has a 50.00 pack-year smoking history. He has never used smokeless tobacco. He reports that he does not drink alcohol or use drugs.  Allergies  Allergen Reactions  . Penicillins Anaphylaxis     Has patient had a PCN reaction causing immediate rash, facial/tongue/throat swelling, SOB or lightheadedness with hypotension: unknown Has patient had a PCN reaction causing severe rash involving mucus membranes or skin necrosis: unknown Has patient had a PCN reaction that required hospitalization: unknown Has patient had a PCN reaction occurring within the last 10 years: no If all of the above answers are "NO", then may proceed with Cephalosporin use.   Mar[place] Kitchen Antihistamines, Loratadine-Type Other (See Comments)    depression  . Other     Beta blockers-Novacaine  Caused depression    Family History  Problem Relation Age of Onset  . Tuberculosis Maternal Grandmother   . Heart attack Neg Hx   . Diabetes Neg Hx   . CAD Neg Hx   . Cancer Neg Hx     Prior to Admission medications   Medication Sig Start Date End Date Taking? Authorizing Provider  lovastatin (MEVACOR) 20 MG tablet Take 20 mg by mouth at bedtime.    Yes [provider]  meclizine (ANTIVERT) 25 MG tablet Take 1 tablet (25 mg total) by mouth 3 (three) times  daily as needed for dizziness. Patient taking differently: Take 25 mg by mouth at bedtime.  01/28/16  Yes Eber Jones, MD  potassium chloride SA (K-DUR,KLOR-CON) 20 MEQ tablet Take 20 mEq by mouth daily.  09/10/14  Yes [provider]  temazepam (RESTORIL) 15 MG capsule Take 1 capsule (15 mg total) by mouth at bedtime. 03/27/17  Yes Sharlene Mccluskey, [place], MD  allopurinol (ZYLOPRIM) 100 MG tablet Take 100 mg by mouth daily.     [provider]  Ascorbic Acid (VITAMIN C PO) Take 1,000 mg by mouth daily.     [provider]  B Complex-C (B-COMPLEX WITH VITAMIN C) tablet Take 1 tablet by mouth daily. Patient not taking: Reported on 01/12/2018 09/17/17   Lavina Hamman, MD  buPROPion (WELLBUTRIN XL) 150 MG 24 hr tablet Take 1 tablet (150 mg total) by mouth daily. 12/18/15   Ambrose Finland, MD  Cholecalciferol 5000 units TABS Take 1  tablet by mouth daily.    [provider]  citalopram (CELEXA) 20 MG tablet Take 1 tablet (20 mg total) by mouth daily. 09/17/17   Lavina Hamman, MD  diclofenac sodium (VOLTAREN) 1 % GEL Apply 1 application topically 4 (four) times daily. Pt uses once daily    [provider]  diltiazem (CARDIZEM CD) 120 MG 24 hr capsule Take 1 capsule (120 mg total) by mouth daily. 09/17/17   Lavina Hamman, MD  fluticasone Asencion Islam) 50 MCG/ACT nasal spray Place 2 sprays into both nostrils daily. Patient taking differently: Place 2 sprays into both nostrils daily as needed for allergies or rhinitis.  01/27/16   Waynetta Pean, PA-C  furosemide (LASIX) 80 MG tablet Take 1 tablet (80 mg total) by mouth daily. 09/17/17   Lavina Hamman, MD  gabapentin (NEURONTIN) 100 MG capsule Take 2 capsules (200 mg total) by mouth 2 (two) times daily. 09/16/17   Lavina Hamman, MD  glimepiride (AMARYL) 1 MG tablet Take 1 mg by mouth daily with breakfast.  03/01/17   [provider]  guaifenesin (HUMIBID E) 400 MG TABS tablet Take 400 mg by mouth every 12 (twelve) hours as needed (for congestion, OTC).    [provider]  iron polysaccharides (NIFEREX) 150 MG capsule Take 1 capsule (150 mg total) by mouth 2 (two) times daily. Patient not taking: Reported on 01/12/2018 10/23/14   Eugenie Filler, MD  metFORMIN (GLUCOPHAGE) 1000 MG tablet Take 1,000 mg by mouth 2 (two) times daily.  05/17/14   [provider]  Multiple Vitamins-Minerals (VISION FORMULA PO) Take 1 tablet by mouth daily.     [provider]  OXYGEN Inhale 2 L into the lungs as needed (shortness of breath).     [provider]  pantoprazole (PROTONIX) 40 MG tablet Take 1 tablet (40 mg total) by mouth 2 (two) times daily. 09/16/17   Lavina Hamman, MD  sodium chloride (OCEAN) 0.65 % SOLN nasal spray Place 1 spray into both nostrils as needed for congestion.    [provider]    Physical  Exam: Vitals:   01/23/18 1756 01/23/18 1800 01/23/18 1815 01/23/18 1849  BP:  124/78  (!) 153/91  Pulse:  78  76  Resp:  (!) 28 18 (!) 24  Temp:    98 F (36.7 C)  TempSrc:    Oral  SpO2: 98% 97%  92%    Constitutional: NAD, calm, comfortable Vitals:   01/23/18 1756 01/23/18 1800 01/23/18 1815 01/23/18 1849  BP:  124/78  (!) 153/91  Pulse:  78  76  Resp:  (!) 28 18 (!) 24  Temp:    98 F (36.7 C)  TempSrc:    Oral  SpO2: 98% 97%  92%   Eyes: PERRL, lids and conjunctivae normal ENMT: Mucous membranes are moist. Posterior pharynx clear of any exudate or lesions. Neck: normal, supple, no masses, no thyromegaly Respiratory: clear to auscultation bilaterally, no wheezing, no crackles. Normal respiratory effort. No accessory muscle use.  Cardiovascular: Irregular rate and rhythm, no murmurs / rubs / gallops. 2+ pitting pedal edema to thighs.   Abdomen: no tenderness, but full and tense, no masses palpated. No hepatosplenomegaly. Bowel sounds positive. Bilat Scrotal swelling with minimal erythema anteriorly R> L , miniscle bruise on anterior surface, minimal tenderness, no appreciable differential warmth. Musculoskeletal: no clubbing / cyanosis. No joint deformity upper and lower extremities.  Skin:  Chronic stasis changes with woody induration to lower extremity.  2  ~3 by 4cm open wounds-lower third medial aspect of left thigh, covered with white slough, with clear drainage, rounding redness with tenderness. Neurologic: CN 2-12 grossly intact.  Strength 5/5 in all 4.  Psychiatric: Normal judgment and insight. Alert and oriented x 3. Normal mood.   Labs on Admission: I have personally reviewed following labs and imaging studies  CBC: Recent Labs  Lab 01/23/18 1409  WBC 4.5  NEUTROABS 3.4  HGB 9.0*  HCT 32.6*  MCV 85.3  PLT 267   Basic Metabolic Panel: Recent Labs  Lab 01/23/18 1409  NA 140  K 4.4  CL 109  CO2 26  GLUCOSE 90  BUN 20  CREATININE 1.16  CALCIUM 8.7*    CBG: Recent Labs  Lab 01/23/18 1242  GLUCAP 103*   Urine analysis:    Component Value Date/Time   COLORURINE YELLOW 01/23/2018 [place] 01/23/2018 1245   LABSPEC 1.020 01/23/2018 1245   PHURINE 5.0 01/23/2018 1245   GLUCOSEU NEGATIVE 01/23/2018 1245   HGBUR NEGATIVE 01/23/2018 1245   BILIRUBINUR NEGATIVE 01/23/2018 1245   KETONESUR NEGATIVE 01/23/2018 1245   PROTEINUR NEGATIVE 01/23/2018 1245   UROBILINOGEN 0.2 10/22/2014 2136   NITRITE NEGATIVE 01/23/2018 1245   LEUKOCYTESUR NEGATIVE 01/23/2018 1245    Radiological Exams on Admission: Dg Chest Port 1 View  Result Date: 01/23/2018 CLINICAL DATA:  Shortness of breath.  Toe infection.  Diabetes. EXAM: PORTABLE CHEST 1 VIEW COMPARISON:  09/11/2017. FINDINGS: Prior median sternotomy. Cardiomegaly with diffuse bilateral pulmonary interstitial prominence and bilateral pleural effusions. No pneumothorax. IMPRESSION: Cardiomegaly with bilateral pulmonary interstitial prominence and bilateral pleural effusions consistent with CHF. Electronically Signed   By: Marcello Moores  Register   On: 01/23/2018 14:41   Dg Foot Complete Right  Result Date: 01/23/2018 CLINICAL DATA:  Great toe infection. EXAM: RIGHT FOOT COMPLETE - 3+ VIEW COMPARISON:  None. FINDINGS: The bones appear adequately mineralized. There is some soft tissue swelling medially in the forefoot. No soft tissue emphysema, foreign body or bone destruction identified. There are mild degenerative changes in the midfoot and at the 1st metatarsophalangeal and interphalangeal joints. Vascular calcifications are noted. IMPRESSION: No radiographic evidence of osteomyelitis. Medial forefoot soft tissue swelling. Electronically Signed   By: Richardean Sale M.D.   On: 01/23/2018 14:43   Dg Hip Unilat With Pelvis 2-3 Views Right  Result Date: 01/23/2018 CLINICAL DATA:  Right hip pain.  Fall in. EXAM: DG HIP (WITH OR WITHOUT PELVIS) 2-3V RIGHT COMPARISON:  03/22/2017 FINDINGS:  Chronic impacted right subcapital femoral  neck fracture identified status post hip pinning. There is no evidence of acute hip fracture or dislocation. There is no evidence of arthropathy or other focal bone abnormality. IMPRESSION: 1. No acute findings. 2. Status post right hip pinning for subcapital femoral neck fracture. Electronically Signed   By: Kerby Moors M.D.   On: 01/23/2018 16:01    EKG: Independently reviewed.  Atrial fibrillation.  Rate 80.  No Significant change from prior.  Assessment/Plan Principal Problem:   CHF exacerbation (HCC)   Decompensated diastolic CHF-dyspnea on exertion, 2+ lower extremity, scrotal and abdominal swelling.  Portable chest x-ray consistent with CHF.  Elevated BNP- 355, from prior.  EKG- atria fib, unchanged. Admits to dietary indiscretion.  Last echo 09/2017- EF 55 to 60%, moderate LVH, unable to assess LV diastolic function. - IV Lasix 40 twice daily - Strict input output, daily weight - Fluid restrict, condom catheter -Supplemental O2 - Trops x 3  Left thigh wounds-injury sustained from spilled coffee.  Started on IV clindamycin in ED.  Wounds appear infected, with mild surrounding redness but no significant cellulitis appreciated.  WBC- 4.5.  Afebrile.  No MRSA history. -Continue IV antibiotics with clindamycin ( Penicillin Allergy noted)  DM-glucose 90. HgbA1c 09/2017- 6.1. - SSI -Hold home metformin and glipizide.  Atrial fibrillation-rate controlled.  Thought to be too high risk for anticoagulation.  History of chronic GI bleeding.   COPD, OSA, OHS- Stable.  On BiPAP and PRN 2L O2 at home. -CPAP QHS  DVT prophylaxis:  Lovenox Code Status: Full Family Communication: Daughter at bedside Disposition Plan: Per rounding team Consults called: None Admission status: Inpt, tele   Bethena Roys MD [facility] Pager [phone] From 3PM- 11PM.  Otherwise please contact night-coverage [email or web address] Password  [facility]  01/23/2018, 6:56 PM

## 2018-01-23 NOTE — Progress Notes (Addendum)
Per chart review:  Pt's PASRR [id number]  Was obtained from [facility] [facility] on 03/25/17.  Assessment completed.  Pt/pt's daughter prefers [facility], but will D/C to another SNF to await open bed at [facility].  CSW left HIPPA-compliant VM w/Kelly Veverly Fells on her private confidential VM at [place] and began [place] with [facility]. Initial referral/therapy notes sent to [facility] SNF's via the hub.  Please reconsult if future social work needs arise.  CSW signing off, as social work intervention is no longer needed.  Alphonse Guild. Larri Yehle, LCSW, LCAS, CSI Clinical Social Worker Ph: [phone]

## 2018-01-23 NOTE — Progress Notes (Signed)
CSW spoke to Amy who is on call at [facility] at ph: [phone] and provided the hospitalist's name and PT recommendation for SNF, as directed and Amy stated she will begin the authorization for the pt now.  CSW will continue to follow for D/C needs.  Alphonse Guild. Onyekachi Gathright, LCSW, LCAS, CSI Clinical Social Worker Ph: [phone]

## 2018-01-23 NOTE — ED Notes (Signed)
ED TO INPATIENT HANDOFF REPORT  Name/Age/Gender Joel [name] 84 y.o. male  Code Status Code Status History    Date Active Date Inactive Code Status Order ID Comments User Context   09/05/2017 1320 09/16/2017 1752 Full Code [id number]  Fanny Skates, MD Inpatient   08/30/2017 2103 09/05/2017 1320 Full Code [id number]  Tomma Rakers, MD ED   03/22/2017 2359 03/27/2017 2102 Full Code [id number]  Etta Quill, DO ED   05/15/2016 0029 05/18/2016 1737 Full Code [id number]  Reubin Milan, MD Inpatient   04/12/2016 1549 04/14/2016 1743 Full Code [id number]  Samella Parr, NP Inpatient   02/27/2016 0222 03/04/2016 1530 Full Code [id number]  Reubin Milan, MD ED   01/27/2016 2244 01/28/2016 1857 Full Code [id number]  Etta Quill, DO ED   12/16/2015 1519 12/18/2015 1920 Full Code [id number]  Fanny Skates, MD Inpatient   12/16/2015 1253 12/16/2015 1519 Full Code [id number]  Jerrye Beavers, PA-C ED   06/29/2015 0306 06/30/2015 1334 DNR [id number]  Toy Baker, MD ED   10/22/2014 2155 10/23/2014 1957 Full Code [id number]  Etta Quill, DO ED   08/04/2013 1755 08/06/2013 0058 DNR [id number]  Thurnell Lose, MD Inpatient    Advance Directive Documentation     Most Recent Value  Type of Advance Directive  Healthcare Power of [place], Living will [daughter states she brought copies in Aug. 2019]  Pre-existing out of facility DNR order (yellow form or pink MOST form)  -  "MOST" Form in Place?  -      Home/SNF/Other Home  Chief Complaint unable to urine   Level of Care/Admitting Diagnosis ED Disposition    ED Disposition Condition [place]: [facility] [100102]  Level of Care: Telemetry [5]  Admit to tele based on following criteria: Other see comments  Comments: CHF  Diagnosis: Scrotal swelling [892119]  Admitting Physician: Bethena Roys [phone]  Attending Physician: Bethena Roys Nessa.Cuff  PT Class (Do Not  Modify): Observation [104]  PT Acc Code (Do Not Modify): Observation [10022]       Medical History Past Medical History:  Diagnosis Date  . Allergic rhinitis   . Anemia   . Anxiety   . Aortic stenosis 2012   mild to mod   . Atrial fibrillation ([place]) 1991   with multiple DCCV  . Bipolar affective disorder ([place])   . Cancer of ascending colon ([place]) 09/05/2017  . CHF (congestive heart failure) ([place]) 08/30/2017  . COPD (chronic obstructive pulmonary disease) ([place])   . Diabetic neuropathy ([place])    in feet  . Diabetic neuropathy ([place])   . Dyslipidemia   . Fatigue    last year or so  . Gout   . Hypertension   . Insomnia   . Obesity   . On home oxygen therapy 2 1/2 liters with bipap at night  . OSA (obstructive sleep apnea)    severe, uses bipap 16 1/2 by 12 or 13 setting  . Prostate cancer ([place])   . Rectal bleeding 12/16/2015  . Type 2 diabetes mellitus ([place])   . Vertigo     Allergies Allergies  Allergen Reactions  . Penicillins Anaphylaxis    Has patient had a PCN reaction causing immediate rash, facial/tongue/throat swelling, SOB or lightheadedness with hypotension: unknown Has patient had a PCN reaction causing severe rash involving mucus membranes or skin necrosis: unknown Has patient had a PCN reaction that required hospitalization: unknown Has  patient had a PCN reaction occurring within the last 10 years: no If all of the above answers are "NO", then may proceed with Cephalosporin use.   Marland Kitchen Antihistamines, Loratadine-Type Other (See Comments)    depression  . Other     Beta blockers-Novacaine  Caused depression    IV Location/Drains/Wounds Patient Lines/Drains/Airways Status   Active Line/Drains/Airways    Name:   Placement date:   Placement time:   Site:   Days:   Peripheral IV 01/23/18 Left Antecubital   01/23/18    1411    Antecubital   less than 1   Incision (Closed) 09/05/17 Abdomen   09/05/17    1134     140   Incision (Closed) 09/10/17 Abdomen   09/10/17     1055     135   Incision - 5 Ports Abdomen 1: Upper 2: Lower 3: Right;Mid 4: Left;Upper 5: Left;Lower   09/05/17    1129     140   Pressure Injury 09/06/17 Stage II -  Partial thickness loss of dermis presenting as a shallow open ulcer with a red, pink wound bed without slough.   09/06/17    0800     139   Pressure Injury 09/08/17 Deep Tissue Injury - Purple or maroon localized area of discolored intact skin or blood-filled blister due to damage of underlying soft tissue from pressure and/or shear.   09/08/17    0931     137   Pressure Injury 09/08/17 Stage II -  Partial thickness loss of dermis presenting as a shallow open ulcer with a red, pink wound bed without slough.   09/08/17    3419     137          Labs/Imaging Results for orders placed or performed during the hospital encounter of 01/23/18 (from the past 48 hour(s))  CBG monitoring, ED     Status: Abnormal   Collection Time: 01/23/18 12:42 PM  Result Value Ref Range   Glucose-Capillary 103 (H) 70 - 99 mg/dL  Urinalysis, Routine w reflex microscopic- may I&O cath if menses     Status: None   Collection Time: 01/23/18 12:45 PM  Result Value Ref Range   Color, Urine YELLOW YELLOW   APPearance CLEAR CLEAR   Specific Gravity, Urine 1.020 1.005 - 1.030   pH 5.0 5.0 - 8.0   Glucose, UA NEGATIVE NEGATIVE mg/dL   Hgb urine dipstick NEGATIVE NEGATIVE   Bilirubin Urine NEGATIVE NEGATIVE   Ketones, ur NEGATIVE NEGATIVE mg/dL   Protein, ur NEGATIVE NEGATIVE mg/dL   Nitrite NEGATIVE NEGATIVE   Leukocytes, UA NEGATIVE NEGATIVE    Comment: Performed at [facility], [place] [address], [place], [place] [address]  Basic metabolic panel     Status: Abnormal   Collection Time: 01/23/18  2:09 PM  Result Value Ref Range   Sodium 140 135 - 145 mmol/L   Potassium 4.4 3.5 - 5.1 mmol/L   Chloride 109 98 - 111 mmol/L   CO2 26 22 - 32 mmol/L   Glucose, Bld 90 70 - 99 mg/dL   BUN 20 8 - 23 mg/dL   Creatinine, Ser 1.16 0.61 -  1.24 mg/dL   Calcium 8.7 (L) 8.9 - 10.3 mg/dL   GFR calc non Af Amer 58 (L) >60 mL/min   GFR calc Af Amer >60 >60 mL/min   Anion gap 5 5 - 15    Comment: Performed at [facility], [place]  [address] [place], [place] [address]  CBC with Differential     Status: Abnormal   Collection Time: 01/23/18  2:09 PM  Result Value Ref Range   WBC 4.5 4.0 - 10.5 K/uL   RBC 3.82 (L) 4.22 - 5.81 MIL/uL   Hemoglobin 9.0 (L) 13.0 - 17.0 g/dL   HCT 32.6 (L) 39.0 - 52.0 %   MCV 85.3 80.0 - 100.0 fL   MCH 23.6 (L) 26.0 - 34.0 pg   MCHC 27.6 (L) 30.0 - 36.0 g/dL   RDW 16.1 (H) 11.5 - 15.5 %   Platelets 211 150 - 400 K/uL   nRBC 0.0 0.0 - 0.2 %   Neutrophils Relative % 74 %   Neutro Abs 3.4 1.7 - 7.7 K/uL   Lymphocytes Relative 9 %   Lymphs Abs 0.4 (L) 0.7 - 4.0 K/uL   Monocytes Relative 10 %   Monocytes Absolute 0.5 0.1 - 1.0 K/uL   Eosinophils Relative 6 %   Eosinophils Absolute 0.3 0.0 - 0.5 K/uL   Basophils Relative 1 %   Basophils Absolute 0.0 0.0 - 0.1 K/uL   Immature Granulocytes 0 %   Abs Immature Granulocytes 0.01 0.00 - 0.07 K/uL    Comment: Performed at [facility], [place] [address], [place], [place] [address]  Brain natriuretic peptide     Status: Abnormal   Collection Time: 01/23/18  2:09 PM  Result Value Ref Range   B Natriuretic Peptide 355.4 (H) 0.0 - 100.0 pg/mL    Comment: Performed at [facility], [place] [address], [place], [place] [address]   Dg Chest Port 1 View  Result Date: 01/23/2018 CLINICAL DATA:  Shortness of breath.  Toe infection.  Diabetes. EXAM: PORTABLE CHEST 1 VIEW COMPARISON:  09/11/2017. FINDINGS: Prior median sternotomy. Cardiomegaly with diffuse bilateral pulmonary interstitial prominence and bilateral pleural effusions. No pneumothorax. IMPRESSION: Cardiomegaly with bilateral pulmonary interstitial prominence and bilateral pleural effusions consistent with CHF. Electronically Signed   By: Marcello Moores  Register    On: 01/23/2018 14:41   Dg Foot Complete Right  Result Date: 01/23/2018 CLINICAL DATA:  Great toe infection. EXAM: RIGHT FOOT COMPLETE - 3+ VIEW COMPARISON:  None. FINDINGS: The bones appear adequately mineralized. There is some soft tissue swelling medially in the forefoot. No soft tissue emphysema, foreign body or bone destruction identified. There are mild degenerative changes in the midfoot and at the 1st metatarsophalangeal and interphalangeal joints. Vascular calcifications are noted. IMPRESSION: No radiographic evidence of osteomyelitis. Medial forefoot soft tissue swelling. Electronically Signed   By: Richardean Sale M.D.   On: 01/23/2018 14:43   Dg Hip Unilat With Pelvis 2-3 Views Right  Result Date: 01/23/2018 CLINICAL DATA:  Right hip pain.  Fall in. EXAM: DG HIP (WITH OR WITHOUT PELVIS) 2-3V RIGHT COMPARISON:  03/22/2017 FINDINGS: Chronic impacted right subcapital femoral neck fracture identified status post hip pinning. There is no evidence of acute hip fracture or dislocation. There is no evidence of arthropathy or other focal bone abnormality. IMPRESSION: 1. No acute findings. 2. Status post right hip pinning for subcapital femoral neck fracture. Electronically Signed   By: Kerby Moors M.D.   On: 01/23/2018 16:01    Pending Labs Unresulted Labs (From admission, onward)   None      Vitals/Pain Today's Vitals   01/23/18 1716 01/23/18 1732 01/23/18 1756 01/23/18 1800  BP: 137/62 136/68  124/78  Pulse: (!) 110 83  78  Resp: (!) 23 (!) 21  (!) 28  Temp:      TempSrc:      SpO2: 95% 99% 98% 97%  PainSc:        Isolation Precautions No active isolations  Medications Medications  bacitracin ointment (1 application Topical Given 01/23/18 1345)  clindamycin (CLEOCIN) IVPB 600 mg (600 mg Intravenous Transfusing/Transfer 01/23/18 1813)    Mobility manual wheelchair

## 2018-01-23 NOTE — Care Management Note (Signed)
Case Management Note  CM consulted for possible SNF placement.  Placed PT evaluation for imminent discharge.  CM spoke with CSW and PT.  No further CM needs noted at this time.  Walton Digilio, Benjaman Lobe, RN 01/23/2018, 4:35 PM

## 2018-01-23 NOTE — ED Notes (Signed)
Attempted bladder scan x2. Minimal retention. Per pt, he states that he has been able to void, but that he has been edematous, and edema has been in the way of his penis.

## 2018-01-23 NOTE — ED Notes (Signed)
Pt sitting on side of bed, pt ate 2 sandwiches. No complaints at this time.

## 2018-01-23 NOTE — ED Notes (Signed)
Social work at bedside.  

## 2018-01-23 NOTE — Progress Notes (Signed)
Consult request has been received. CSW attempting to follow up at present time.  5:22 PM CSW met with EDP, RNCM and reviewed chart.  CSW met with pt who stated pt was at [facility] for 28 days after being D/C'd following an inpatient stay in February.  Pt/pt's daughter stated pt was operated on for colon cancer in August 2019 and refused SNF and has not fully recuperated since making it increasingly difficult to walk.  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  Pt stated pt has used a wheelchair since February.  Pt/Pt's daughter stated pt had made some progress due to PT in the home and as of late the pt has not been progressing and that the shot they gave me "in my knees has worn off making my knees very painful".  Pt was seen by the attending ED :Physician as well as the pt's EPD and is being considered for inpatient admission due to cellulitis.  Pt has diabetes and pt's great toe is infected, per the chart.  Pt stated pt would like short-term rehab as pt cannot care for himself at home due to pt's inability to walk and pt's daughter and pt stated pt has gained 30 pounds and pt's daughter and son cannot get pt up, especially if pt were to fall.  Pt has had two recent falls, per the pt/pt's daughter.  Pt gave verbal permission for the CSW to speak to [facility] about possible placement options and to begin an authorization and pt/pt's daughter provided verbal permission for the CSW to do so and also  To create and FL-2 and send referrals out on the pt's behalf.  RN CM placed PT consult and CSW to spoke to PT who stated they would attempt to see the pt tonight.  CSW spoke to [facility] who stated the hospitalist's name and/or the PT recommendation would be necessary to begin the authorization and that the [facility]TA on-call person would be available until 8pm.  CSW confirmed the hospitalist is Dr. Denton Brick.  CSW will continue to follow for  D/C needs.  Alphonse Guild. Harlow Basley, LCSW, LCAS, CSI Clinical Social Worker Ph: [phone]

## 2018-01-23 NOTE — ED Provider Notes (Signed)
[facility] DEPT Provider Note   CSN: [id number] Arrival date & time: 01/23/18  1225     History   Chief Complaint Chief Complaint  Patient presents with  . Urinary Retention    HPI RAMAJ FRANGOS is a 84 y.o. male.  84 year old male with complicated past history including non-insulin-dependent diabetes and A. fib, brought in by family from home with multiple complaints.  Patient states that he fell and fractured his right hip almost a year ago and has been confined to a wheelchair since that time.  Patient is able to take a few steps with a walker to get to his commode however reports difficulty getting off of the commode and back to his wheelchair with concerns that he may fall.  Patient and family report that he has had several falls in the past month without significant injuries.  Patient reports worsening right hip pain recently.  Patient also reports swelling of his scrotum which is making it difficult to urinate as his scrotum is so swollen he is having a hard time getting his penis into a urinal.  Also reports right great toe infection, reports swelling, redness, drainage and states he will likely lose his great toenail.  Patient also reports burn to his left medial thigh from actually spilling coffee on himself a few days ago.  Also reports he likely has pressure sores to his buttocks area.  Patient feels like he is unable to care for himself at home, has a home health nurse who checks on him periodically however does not feel this is enough care.  He denies chest pain or difficulty breathing, denies abdominal pain, nausea, vomiting, difficulty moving his bowels.  No other complaints or concerns.     Past Medical History:  Diagnosis Date  . Allergic rhinitis   . Anemia   . Anxiety   . Aortic stenosis 2012   mild to mod   . Atrial fibrillation ([place]) 1991   with multiple DCCV  . Bipolar affective disorder ([place])   . Cancer of ascending colon  ([place]) 09/05/2017  . CHF (congestive heart failure) ([place]) 08/30/2017  . COPD (chronic obstructive pulmonary disease) ([place])   . Diabetic neuropathy ([place])    in feet  . Diabetic neuropathy ([place])   . Dyslipidemia   . Fatigue    last year or so  . Gout   . Hypertension   . Insomnia   . Obesity   . On home oxygen therapy 2 1/2 liters with bipap at night  . OSA (obstructive sleep apnea)    severe, uses bipap 16 1/2 by 12 or 13 setting  . Prostate cancer ([place])   . Rectal bleeding 12/16/2015  . Type 2 diabetes mellitus ([place])   . Vertigo     Patient Active Problem List   Diagnosis Date Noted  . Pain in right hip 12/06/2017  . Unilateral primary osteoarthritis, right knee 12/06/2017  . Normal coronary arteries 09/27/2017  . Pressure injury of skin 09/07/2017  . S/P colon resection 09/05/2017  . Cancer of ascending colon ([place]) 09/05/2017  . Dyspnea 09/01/2017  . Blood loss anemia 08/30/2017  . Closed displaced fracture of right femoral neck ([place]) 03/22/2017  . Cough 01/17/2017  . Persistent atrial fibrillation 01/17/2017  . Leg swelling 09/26/2016  . GI bleed 05/15/2016  . UGI bleed 05/14/2016  . COPD (chronic obstructive pulmonary disease) ([place]) 05/14/2016  . Chronic GI bleeding 04/12/2016  . HLD (hyperlipidemia) 04/12/2016  . Anemia due  to chronic blood loss 04/12/2016  . Diabetes mellitus with complication ([place])   . BPPV (benign paroxysmal positional vertigo) 01/27/2016  . Hypertension 01/27/2016  . Malignant neoplasm of prostate ([place]) 01/07/2016  . Bipolar I disorder ([place])   . Bright red rectal bleeding 12/16/2015  . Rectal bleeding 12/16/2015  . Pulmonary hypertension ([place]) 06/29/2015  . Moderate aortic stenosis 04/17/2013  . Encounter for monitoring Coumadin therapy 04/17/2013  . Anemia, iron deficiency 04/04/2013  . Dyspnea on exertion 11/18/2012  . Obesity hypoventilation syndrome ([place]) 09/19/2012  . ALLERGIC RHINITIS 06/24/2009  . DM2 (diabetes mellitus, type 2)  ([place]) 03/13/2007  . OSA (obstructive sleep apnea) 03/13/2007  . COPD mixed type ([place]) 03/13/2007    Past Surgical History:  Procedure Laterality Date  . BIOPSY  09/02/2017   Procedure: BIOPSY;  Surgeon: Ronald Lobo, MD;  Location: [facility];  Service: Endoscopy;;  . CARDIOVERSION  yrs ago   prior to 1998  . COLON RESECTION N/A 09/05/2017   Procedure: LAPAROSCOPIC RIGHT COLON RESECTION;  Surgeon: Fanny Skates, MD;  Location: [facility];  Service: General;  Laterality: N/A;  . COLONOSCOPY N/A 09/02/2017   Procedure: COLONOSCOPY;  Surgeon: Ronald Lobo, MD;  Location: [facility];  Service: Endoscopy;  Laterality: N/A;  . COLONOSCOPY WITH PROPOFOL N/A 01/23/2013   Procedure: COLONOSCOPY WITH PROPOFOL;  Surgeon: Garlan Fair, MD;  Location: [facility];  Service: Endoscopy;  Laterality: N/A;  . electro shock  1969   for depression  . ENTEROSCOPY Left 05/17/2016   Procedure: ENTEROSCOPY;  Surgeon: Wilford Corner, MD;  Location: [facility];  Service: Endoscopy;  Laterality: Left;  . ESOPHAGOGASTRODUODENOSCOPY N/A 03/03/2016   Procedure: ESOPHAGOGASTRODUODENOSCOPY (EGD);  Surgeon: Teena Irani, MD;  Location: [facility];  Service: Endoscopy;  Laterality: N/A;  . ESOPHAGOGASTRODUODENOSCOPY N/A 03/26/2016   Procedure: ESOPHAGOGASTRODUODENOSCOPY (EGD);  Surgeon: Teena Irani, MD;  Location[facility] ENDOSCOPY;  Service: Endoscopy;  Laterality: N/A;  would like to use ultraslim scope  . ESOPHAGOGASTRODUODENOSCOPY N/A 08/31/2017   Procedure: ESOPHAGOGASTRODUODENOSCOPY (EGD);  Surgeon: Laurence Spates, MD;  Location: [facility];  Service: Endoscopy;  Laterality: N/A;  . ESOPHAGOGASTRODUODENOSCOPY (EGD) WITH PROPOFOL N/A 01/23/2013   Procedure: ESOPHAGOGASTRODUODENOSCOPY (EGD) WITH PROPOFOL;  Surgeon: Garlan Fair, MD;  [facility];  Service: Endoscopy;  Laterality: N/A;  . ESOPHAGOGASTRODUODENOSCOPY (EGD) WITH PROPOFOL N/A 04/13/2016   Procedure: ESOPHAGOGASTRODUODENOSCOPY (EGD) WITH  PROPOFOL;  Surgeon: Otis Brace, MD;  Location: [facility];  Service: Gastroenterology;  Laterality: N/A;  . GIVENS CAPSULE STUDY N/A 03/03/2016   Procedure: GIVENS CAPSULE STUDY;  Surgeon: Teena Irani, MD;  [facility];  Service: Endoscopy;  Laterality: N/A;  . HEMORRHOID SURGERY N/A 12/16/2015   Procedure: PROCTOSCOPY WITH CONTROL OF BLEEDING;  Surgeon: Fanny Skates, MD;  Location: [facility];  Service: General;  Laterality: N/A;  . HIP PINNING,CANNULATED Right 03/23/2017   Procedure: CANNULATED HIP PINNING;  Surgeon: Marybelle Killings, MD;  Location: [facility];  Service: Orthopedics;  Laterality: Right;  . KNEE SURGERY  1970's   rt  . LAPAROTOMY N/A 09/10/2017   Procedure: EXPLORATORY LAPAROTOMY, PARTIAL COLECTOMY;  Surgeon: Excell Seltzer, MD;  Location: [facility];  Service: General;  Laterality: N/A;  . [place]  . POLYPECTOMY  09/02/2017   Procedure: POLYPECTOMY;  Surgeon: Ronald Lobo, MD;  [facility];  Service: Endoscopy;;  . PROSTATE BIOPSY    . SHOULDER SURGERY  7-8 yrs ago   rt        Home Medications    Prior to Admission  medications   Medication Sig Start Date End Date Taking? Authorizing Provider  allopurinol (ZYLOPRIM) 100 MG tablet Take 100 mg by mouth daily.     [provider]  Ascorbic Acid (VITAMIN C PO) Take 1,000 mg by mouth daily.     [provider]  B Complex-C (B-COMPLEX WITH VITAMIN C) tablet Take 1 tablet by mouth daily. Patient not taking: Reported on 01/12/2018 09/17/17   Lavina Hamman, MD  buPROPion (WELLBUTRIN XL) 150 MG 24 hr tablet Take 1 tablet (150 mg total) by mouth daily. 12/18/15   Ambrose Finland, MD  Cholecalciferol 5000 units TABS Take 1 tablet by mouth daily.    [provider]  citalopram (CELEXA) 20 MG tablet Take 1 tablet (20 mg total) by mouth daily. 09/17/17   Lavina Hamman, MD  diclofenac sodium (VOLTAREN) 1 % GEL Apply 1 application topically 4 (four) times daily. Pt uses once  daily    [provider]  diltiazem (CARDIZEM CD) 120 MG 24 hr capsule Take 1 capsule (120 mg total) by mouth daily. 09/17/17   Lavina Hamman, MD  fluticasone Asencion Islam) 50 MCG/ACT nasal spray Place 2 sprays into both nostrils daily. Patient taking differently: Place 2 sprays into both nostrils daily as needed for allergies or rhinitis.  01/27/16   Waynetta Pean, PA-C  furosemide (LASIX) 80 MG tablet Take 1 tablet (80 mg total) by mouth daily. 09/17/17   Lavina Hamman, MD  gabapentin (NEURONTIN) 100 MG capsule Take 2 capsules (200 mg total) by mouth 2 (two) times daily. 09/16/17   Lavina Hamman, MD  glimepiride (AMARYL) 1 MG tablet Take 1 mg by mouth daily with breakfast.  03/01/17   [provider]  guaifenesin (HUMIBID E) 400 MG TABS tablet Take 400 mg by mouth every 12 (twelve) hours as needed (for congestion, OTC).    [provider]  iron polysaccharides (NIFEREX) 150 MG capsule Take 1 capsule (150 mg total) by mouth 2 (two) times daily. Patient not taking: Reported on 01/12/2018 10/23/14   Eugenie Filler, MD  lovastatin (MEVACOR) 20 MG tablet Take 20 mg by mouth at bedtime.     [provider]  meclizine (ANTIVERT) 25 MG tablet Take 1 tablet (25 mg total) by mouth 3 (three) times daily as needed for dizziness. Patient taking differently: Take 25 mg by mouth at bedtime.  01/28/16   Eber Jones, MD  metFORMIN (GLUCOPHAGE) 1000 MG tablet Take 1,000 mg by mouth 2 (two) times daily.  05/17/14   [provider]  Multiple Vitamins-Minerals (VISION FORMULA PO) Take 1 tablet by mouth daily.     [provider]  OXYGEN Inhale 2 L into the lungs as needed (shortness of breath).     [provider]  pantoprazole (PROTONIX) 40 MG tablet Take 1 tablet (40 mg total) by mouth 2 (two) times daily. 09/16/17   Lavina Hamman, MD  potassium chloride SA (K-DUR,KLOR-CON) 20 MEQ tablet Take 20 mEq by mouth daily.  09/10/14   [provider]  sodium chloride (OCEAN) 0.65 % SOLN nasal spray Place 1 spray into both nostrils as needed for congestion.    [provider]  temazepam (RESTORIL) 15 MG capsule Take 1 capsule (15 mg total) by mouth at bedtime. 03/27/17   Roxan Hockey, MD    Family History Family History  Problem Relation Age of Onset  . Tuberculosis Maternal Grandmother   . Heart attack Neg Hx   . Diabetes  Neg Hx   . CAD Neg Hx   . Cancer Neg Hx     Social History Social History   Tobacco Use  . Smoking status: Former Smoker    Packs/day: 2.00    Years: 25.00    Pack years: 50.00    Types: Cigarettes    Last attempt to quit: 07/08/1976    Years since quitting: 41.5  . Smokeless tobacco: Never Used  Substance Use Topics  . Alcohol use: No    Comment: in AA since 1977, no alcohol since 1977  . Drug use: No    Types: Marijuana    Comment: marijuana use 37 years ago     Allergies   Penicillins; Antihistamines, loratadine-type; and Other   Review of Systems Review of Systems  Constitutional: Negative for fever.  Respiratory: Negative for shortness of breath.   Cardiovascular: Negative for chest pain.  Gastrointestinal: Negative for abdominal pain, constipation, diarrhea, nausea and vomiting.  Genitourinary: Positive for scrotal swelling. Negative for decreased urine volume, difficulty urinating and dysuria.  Musculoskeletal: Positive for arthralgias and gait problem.  Skin: Positive for wound.  Allergic/Immunologic: Positive for immunocompromised state.  Neurological: Negative for dizziness and weakness.  Hematological: Does not bruise/bleed easily.  Psychiatric/Behavioral: Negative for confusion.  All other systems reviewed and are negative.    Physical Exam Updated Vital Signs BP 123/72   Pulse (!) 102   Temp (!) 97.4 F (36.3 C) (Oral)   Resp 18   SpO2 95%   Physical Exam Vitals signs and nursing note reviewed.  Constitutional:      General: He is not in  acute distress.    Appearance: He is well-developed. He is obese. He is not diaphoretic.  HENT:     Head: Normocephalic and atraumatic.     Nose: Nose normal.     Mouth/Throat:     Mouth: Mucous membranes are moist.  Eyes:     Pupils: Pupils are equal, round, and reactive to light.  Neck:     Musculoskeletal: Neck supple.  Cardiovascular:     Rate and Rhythm: Normal rate. Rhythm irregular.     Pulses: Normal pulses.     Heart sounds: Normal heart sounds. No murmur.  Pulmonary:     Effort: Pulmonary effort is normal. No respiratory distress.     Breath sounds: Normal breath sounds. No wheezing.  Abdominal:     Palpations: Abdomen is soft.     Tenderness: There is no abdominal tenderness.     Hernia: A hernia is present.     Comments: Ventral hernia/post surgical site healing.   Genitourinary:    Comments: ?inguinal hernia, swelling of scrotum with mild erythema, mild tenderness. Musculoskeletal:        General: No deformity.     Right lower leg: Edema present.     Left lower leg: Edema present.       Feet:     Comments: Pain with movement of the right hip and knee  Skin:    General: Skin is warm and dry.     Findings: Erythema present.       Neurological:     Mental Status: He is alert and oriented to person, place, and time.  Psychiatric:        Mood and Affect: Mood normal.        Behavior: Behavior normal.      ED Treatments / Results  Labs (all labs ordered are listed, but only abnormal results are displayed) Labs  Reviewed  BASIC METABOLIC PANEL - Abnormal; Notable for the following components:      Result Value   Calcium 8.7 (*)    GFR calc non Af Amer 58 (*)    All other components within normal limits  CBC WITH DIFFERENTIAL/PLATELET - Abnormal; Notable for the following components:   RBC 3.82 (*)    Hemoglobin 9.0 (*)    HCT 32.6 (*)    MCH 23.6 (*)    MCHC 27.6 (*)    RDW 16.1 (*)    Lymphs Abs 0.4 (*)    All other components within normal limits   CBG MONITORING, ED - Abnormal; Notable for the following components:   Glucose-Capillary 103 (*)    All other components within normal limits  URINALYSIS, ROUTINE W REFLEX MICROSCOPIC  BRAIN NATRIURETIC PEPTIDE    EKG EKG Interpretation  Date/Time:  Monday January 23 2018 13:44:05 EST Ventricular Rate:  80 PR Interval:    QRS Duration: 102 QT Interval:  429 QTC Calculation: 495 R Axis:   58 Text Interpretation:  Atrial fibrillation Borderline repolarization abnormality Borderline prolonged QT interval Confirmed by Lacretia Leigh ([id number]) on 01/23/2018 2:00:34 PM   Radiology Dg Chest Port 1 View  Result Date: 01/23/2018 CLINICAL DATA:  Shortness of breath.  Toe infection.  Diabetes. EXAM: PORTABLE CHEST 1 VIEW COMPARISON:  09/11/2017. FINDINGS: Prior median sternotomy. Cardiomegaly with diffuse bilateral pulmonary interstitial prominence and bilateral pleural effusions. No pneumothorax. IMPRESSION: Cardiomegaly with bilateral pulmonary interstitial prominence and bilateral pleural effusions consistent with CHF. Electronically Signed   By: Marcello Moores  Register   On: 01/23/2018 14:41   Dg Foot Complete Right  Result Date: 01/23/2018 CLINICAL DATA:  Great toe infection. EXAM: RIGHT FOOT COMPLETE - 3+ VIEW COMPARISON:  None. FINDINGS: The bones appear adequately mineralized. There is some soft tissue swelling medially in the forefoot. No soft tissue emphysema, foreign body or bone destruction identified. There are mild degenerative changes in the midfoot and at the 1st metatarsophalangeal and interphalangeal joints. Vascular calcifications are noted. IMPRESSION: No radiographic evidence of osteomyelitis. Medial forefoot soft tissue swelling. Electronically Signed   By: Richardean Sale M.D.   On: 01/23/2018 14:43   Dg Hip Unilat With Pelvis 2-3 Views Right  Result Date: 01/23/2018 CLINICAL DATA:  Right hip pain.  Fall in. EXAM: DG HIP (WITH OR WITHOUT PELVIS) 2-3V RIGHT COMPARISON:   03/22/2017 FINDINGS: Chronic impacted right subcapital femoral neck fracture identified status post hip pinning. There is no evidence of acute hip fracture or dislocation. There is no evidence of arthropathy or other focal bone abnormality. IMPRESSION: 1. No acute findings. 2. Status post right hip pinning for subcapital femoral neck fracture. Electronically Signed   By: Kerby Moors M.D.   On: 01/23/2018 16:01    Procedures Procedures (including critical care time)  Medications Ordered in ED Medications  bacitracin ointment (1 application Topical Given 01/23/18 1345)  clindamycin (CLEOCIN) IVPB 600 mg (has no administration in time range)     Initial Impression / Assessment and Plan / ED Course  I have reviewed the triage vital signs and the nursing notes.  Pertinent labs & imaging results that were available during my care of the patient were reviewed by me and considered in my medical decision making (see chart for details).  Clinical Course as of Jan 23 1702  Mon Jan 24, 5735  843 84 year old male with multiple medical problems including non-insulin-dependent diabetes presents with primary complaint of progressively worsening difficulty urinating.  Patient reports  swelling of his scrotum to the point now where he has difficulty getting his penis into a urinal.  This is particularly bothersome for the patient as he is on Lasix and has to void multiple times a day.  Also reports slight tenderness of his scrotum.  Patient also reports swelling and redness of his right great toe and a burn to his medial left thigh from hot coffee a few days ago.  She reports ongoing pain in his right knee and right hip from prior hip fracture with pinning of his right hip in February of this year.  Patient has been in a wheelchair primarily since his right hip surgery.  Patient does use a walker for short periods of time.  Patient is afraid that he will fall particularly while trying to get off of the commode  and back to his wheelchair.  On exam patient has a swollen and mildly tender, mildly erythematous scrotum with question of inguinal hernia, no abdominal tenderness, denies changes in bowel habits or vomiting. Right great toe with avulsed nail (appears chronic/secondary to fungal infection of nail), states he injured the toe about 1 month ago. Mild swelling, erythema of the toe, no tenderness, no deformity. Partial thickness burn to the left medial thigh from hot coffee spill a few days ago, request bacitracin dressing to area.  EKG with a-fib, unchanged from previous, BMP unremarkable, CBC with chronic anemia. UA pending. Vitals unremarkable. Patient seen by Dr. Sedonia Small, ER attending, will consult hospitalist for admission for cellulitis of the scrotum and toe.  Case management consulted, patient to have PT eval, may need SNF placement for rehab.    [LM]  1703 Case discussed with hospitalist who will see the patient.    [LM]    Clinical Course User Index [LM] Tacy Learn, PA-C   Final Clinical Impressions(s) / ED Diagnoses   Final diagnoses:  Cellulitis, scrotum  Cellulitis of toe of right foot    ED Discharge Orders    None       Tacy Learn, PA-C 01/23/18 1703    Maudie Flakes, MD 01/23/18 [phone]

## 2018-01-24 ENCOUNTER — Other Ambulatory Visit: Payer: Self-pay

## 2018-01-24 DIAGNOSIS — H8110 Benign paroxysmal vertigo, unspecified ear: Secondary | ICD-10-CM

## 2018-01-24 DIAGNOSIS — I10 Essential (primary) hypertension: Secondary | ICD-10-CM

## 2018-01-24 DIAGNOSIS — F319 Bipolar disorder, unspecified: Secondary | ICD-10-CM

## 2018-01-24 DIAGNOSIS — E7849 Other hyperlipidemia: Secondary | ICD-10-CM

## 2018-01-24 DIAGNOSIS — E11628 Type 2 diabetes mellitus with other skin complications: Secondary | ICD-10-CM

## 2018-01-24 DIAGNOSIS — E119 Type 2 diabetes mellitus without complications: Secondary | ICD-10-CM

## 2018-01-24 DIAGNOSIS — L03119 Cellulitis of unspecified part of limb: Secondary | ICD-10-CM

## 2018-01-24 LAB — BASIC METABOLIC PANEL
Anion gap: 11 (ref 5–15)
BUN: 21 mg/dL (ref 8–23)
CO2: 24 mmol/L (ref 22–32)
Calcium: 8.9 mg/dL (ref 8.9–10.3)
Chloride: 105 mmol/L (ref 98–111)
Creatinine, Ser: 1.26 mg/dL — ABNORMAL HIGH (ref 0.61–1.24)
GFR calc Af Amer: >60 mL/min (ref >60)
GFR calc non Af Amer: 52 mL/min — ABNORMAL LOW (ref >60)
Glucose, Bld: 109 mg/dL — ABNORMAL HIGH (ref 70–99)
POTASSIUM: 4.1 mmol/L (ref 3.5–5.1)
Sodium: 140 mmol/L (ref 135–145)

## 2018-01-24 LAB — TROPONIN I: Troponin I: <0.03 ng/mL (ref <0.03)

## 2018-01-24 LAB — GLUCOSE, CAPILLARY
GLUCOSE-CAPILLARY: 129 mg/dL — AB (ref 70–99)
Glucose-Capillary: 101 mg/dL — ABNORMAL HIGH (ref 70–99)
Glucose-Capillary: 106 mg/dL — ABNORMAL HIGH (ref 70–99)
Glucose-Capillary: 140 mg/dL — ABNORMAL HIGH (ref 70–99)

## 2018-01-24 MED ORDER — GERHARDT'S BUTT CREAM
TOPICAL_CREAM | Freq: Four times a day (QID) | CUTANEOUS | Status: DC
  Administered 2018-01-24: 17:00:00 via TOPICAL
  Administered 2018-01-24: 1 via TOPICAL
  Administered 2018-01-24 – 2018-01-26 (×6): via TOPICAL
  Administered 2018-01-26: 1 via TOPICAL
  Administered 2018-01-26 – 2018-01-27 (×2): via TOPICAL
  Filled 2018-01-24: qty 1

## 2018-01-24 MED ORDER — FUROSEMIDE 10 MG/ML IJ SOLN
40.0000 mg | Freq: Three times a day (TID) | INTRAMUSCULAR | Status: DC
  Administered 2018-01-24 – 2018-01-25 (×2): 40 mg via INTRAVENOUS
  Filled 2018-01-24 (×2): qty 4

## 2018-01-24 NOTE — Patient Outreach (Signed)
[name]) Care Management  01/24/2018  Jo[facility] [name] 01-23-1933 [id number]  According to chart, member visited [name] due to c/o urinary retention, groin swelling, dyspnea on exertion, L thigh bruise draining clear fluids s/p c/o spilled hot coffee. Initially OBS status; however, admitted yesterday 01-23-2018 due to cellulitis of scrotum and R toe.  Member with c/o bilateral knee pain, weight gain, recent falls at home x2. Hospital discharge plan for possible [place]. Hospital Liaisons made aware.   Will follow up with member when return to [employer].   Joel Mola "ANN" [name], RN-BSN  [name] Care Management  Community Care Management Coordinator  [phone] [email or web address]
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
## 2018-01-25 DIAGNOSIS — J449 Chronic obstructive pulmonary disease, unspecified: Secondary | ICD-10-CM

## 2018-01-25 DIAGNOSIS — L03116 Cellulitis of left lower limb: Secondary | ICD-10-CM

## 2018-01-25 LAB — GLUCOSE, CAPILLARY
GLUCOSE-CAPILLARY: 120 mg/dL — AB (ref 70–99)
Glucose-Capillary: 107 mg/dL — ABNORMAL HIGH (ref 70–99)
Glucose-Capillary: 139 mg/dL — ABNORMAL HIGH (ref 70–99)

## 2018-01-25 MED ORDER — SACCHAROMYCES BOULARDII 250 MG PO CAPS
250.0000 mg | ORAL_CAPSULE | Freq: Two times a day (BID) | ORAL | Status: DC
  Administered 2018-01-25 – 2018-01-27 (×5): 250 mg via ORAL
  Filled 2018-01-25 (×5): qty 1

## 2018-01-25 MED ORDER — FUROSEMIDE 10 MG/ML IJ SOLN
40.0000 mg | Freq: Three times a day (TID) | INTRAMUSCULAR | Status: AC
  Administered 2018-01-25 – 2018-01-26 (×3): 40 mg via INTRAVENOUS
  Filled 2018-01-25 (×3): qty 4
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
## 2018-01-25 NOTE — Progress Notes (Signed)
Physical Therapy Treatment Patient Details Name: Joel [name] MRN: [id number] DOB: Mar 09, 1932 Today's Date: 01/25/2018    History of Present Illness 84 yo with significant medical history, colon cancer, L hip fracture, CAD, COPD, afib presents with increased weakness, shortness of breath, wound on his R toe and wound on his left inner thigh. Pt states he has been declining in general over the past year, has had several hositpalizations, ST-SNF and recnetly HHPT. With other medical inssues during this, pt feels he just has not had time to get fullyrecovered and strong to mange things indpendently safely at home.Marland Kitchen He has been ambulating a little on RW , but with 2 falls in last month, so mostly using a WC for safety in his home at this time.     PT Comments    Pt with very increased time to perform mobility tasks. Pt with continued LE weakness, LE edema, decreased knee ROM bilaterally R>L, fatigue with mobility, and dyspnea with minimal exertion. Pt with depressed mood about mobility deficits, reporting to PT that he has had a rough 2 years medically and thought about giving up on trying to progress mobility. Pt more motivated after PT session. PT continuing to recommend SNF, and will continue to follow acutely.   O2sats on room air after session: 97%   Follow Up Recommendations  SNF     Equipment Recommendations  None recommended by PT    Recommendations for Other Services       Precautions / Restrictions Precautions Precautions: Fall Restrictions Weight Bearing Restrictions: No    Mobility  Bed Mobility Overal bed mobility: Needs Assistance             General bed mobility comments: Pt up in chair upon PT arrival to room   Transfers Overall transfer level: Needs assistance Equipment used: Rolling walker (2 wheeled) Transfers: Sit to/from Stand Sit to Stand: Mod assist         General transfer comment:  Mod assist for power up and steadying, verbal cuing for full  erect posture. Pt with decreased WB on RLE. Sit to stands for LE and trunk strengthening, x3, able to maintain standing for 10 seconds each time before fatiguing. Pt with 2 minute rest break between each stand due to dyspnea 2/4. Pt with 2 short steps backwards with RW for positioning in recliner.  Ambulation/Gait Ambulation/Gait assistance: (NT - pt fatiguing quickly with sit to stands )               Marine scientist Rankin (Stroke Patients Only)       Balance Overall balance assessment: Needs assistance Sitting-balance support: No upper extremity supported Sitting balance-Leahy Scale: Fair       Standing balance-Leahy Scale: Poor Standing balance comment: relies on UE support and PT for steadying                             Cognition Arousal/Alertness: Awake/alert Behavior During Therapy: WFL for tasks assessed/performed Overall Cognitive Status: Within Functional Limits for tasks assessed                                 General Comments: Pt expressing thoughts of giving up on trying to perform mobility, his depressed attitude improved with LE exercises.  Exercises General Exercises - Lower Extremity Short Arc Quad: AROM;Right;10 reps;Seated Long Arc Quad: AROM;Left;10 reps;Seated Hip Flexion/Marching: AROM;Both;10 reps;Seated Toe Raises: AROM;Both;10 reps;Seated Heel Raises: AROM;Both;10 reps;Seated Mini-Sqauts: (See "mobility" section for sit to stands )    General Comments General comments (skin integrity, edema, etc.): Pt lacking ~20* knee extension bilaterally, LLE slightly improved extension in WB but RLE too painful to straighten per pt. Pt's bilateral LEs tight with edema/swelling from CHF exacerbation, pitting edema noted.       Pertinent Vitals/Pain Pain Assessment: Faces Faces Pain Scale: Hurts whole lot Pain Location: bilateral knees R>L, with LE exercises and standing ("I  have no cartilage in my knees) Pain Descriptors / Indicators: Grimacing;Sharp Pain Intervention(s): Limited activity within patient's tolerance;Monitored during session    Home Living                      Prior Function            PT Goals (current goals can now be found in the care plan section) Acute Rehab PT Goals Patient Stated Goal: " I want to be able to stay in my home as long as i can , but know I need some rehab before i can do so. I am willing to work hard to get stronger and able again" Time For Goal Achievement: 02/06/18 Potential to Achieve Goals: Good Progress towards PT goals: Progressing toward goals    Frequency    Min 2X/week      PT Plan Current plan remains appropriate    Co-evaluation              AM-PAC PT "6 Clicks" Mobility   Outcome Measure  Help needed turning from your back to your side while in a flat bed without using bedrails?: A Lot Help needed moving from lying on your back to sitting on the side of a flat bed without using bedrails?: A Lot Help needed moving to and from a bed to a chair (including a wheelchair)?: A Lot Help needed standing up from a chair using your arms (e.g., wheelchair or bedside chair)?: A Lot Help needed to walk in hospital room?: A Lot Help needed climbing 3-5 steps with a railing? : Total 6 Click Score: 11    End of Session Equipment Utilized During Treatment: Gait belt Activity Tolerance: Patient limited by pain;Patient limited by fatigue Patient left: in chair;with chair alarm set;with call bell/phone within reach(with LEs elevated due to edema ) Nurse Communication: Mobility status PT Visit Diagnosis: Other abnormalities of gait and mobility (R26.89);Muscle weakness (generalized) (M62.81)     Time: 0981-1914 PT Time Calculation (min) (ACUTE ONLY): 35 min  Charges:  $Therapeutic Exercise: 8-22 mins $Therapeutic Activity: 8-22 mins                     Una Yeomans Conception Chancy, PT Acute Rehabilitation  Services Pager [phone]  Office [phone]    Bellamarie Pflug D Elonda Husky 01/25/2018, 4:22 PM

## 2018-01-25 NOTE — NC FL2 (Signed)
[place] LEVEL OF CARE SCREENING TOOL     IDENTIFICATION  Patient Name: Joel [name] Birthdate: 1932/11/15 Sex: male Admission Date (Current Location): 01/23/2018  [facility] and [place] Number:  Herbalist and Address:  [facility],  [place] [address], [place]      Provider Number: [id number]  Attending Physician Name and Address:  Damita Lack, MD  Relative Name and Phone Number:       Current Level of Care: Hospital Recommended Level of Care: [place] Prior Approval Number:    Date Approved/Denied:   PASRR Number: [id number]  Discharge Plan: SNF    Current Diagnoses: Patient Active Problem List   Diagnosis Date Noted  . CHF exacerbation ([place]) 01/23/2018  . Pain in right hip 12/06/2017  . Unilateral primary osteoarthritis, right knee 12/06/2017  . Normal coronary arteries 09/27/2017  . Pressure injury of skin 09/07/2017  . S/P colon resection 09/05/2017  . Cancer of ascending colon ([place]) 09/05/2017  . Dyspnea 09/01/2017  . Blood loss anemia 08/30/2017  . Closed displaced fracture of right femoral neck ([place]) 03/22/2017  . Cough 01/17/2017  . Persistent atrial fibrillation 01/17/2017  . Leg swelling 09/26/2016  . GI bleed 05/15/2016  . UGI bleed 05/14/2016  . COPD (chronic obstructive pulmonary disease) ([place]) 05/14/2016  . Chronic GI bleeding 04/12/2016  . HLD (hyperlipidemia) 04/12/2016  . Anemia due to chronic blood loss 04/12/2016  . Diabetes mellitus with complication ([place])   . BPPV (benign paroxysmal positional vertigo) 01/27/2016  . Hypertension 01/27/2016  . Malignant neoplasm of prostate ([place]) 01/07/2016  . Bipolar I disorder ([place])   . Bright red rectal bleeding 12/16/2015  . Rectal bleeding 12/16/2015  . Pulmonary hypertension ([place]) 06/29/2015  . Moderate aortic stenosis 04/17/2013  . Encounter for monitoring Coumadin therapy 04/17/2013  . Anemia, iron deficiency 04/04/2013  .  Dyspnea on exertion 11/18/2012  . Obesity hypoventilation syndrome ([place]) 09/19/2012  . ALLERGIC RHINITIS 06/24/2009  . DM2 (diabetes mellitus, type 2) ([place]) 03/13/2007  . OSA (obstructive sleep apnea) 03/13/2007  . COPD mixed type ([place]) 03/13/2007    Orientation RESPIRATION BLADDER Height & Weight     Time, Situation, Place, Self  Normal Continent Weight: 254 lb 3.1 oz (115.3 kg) Height:  6' (182.9 cm)  BEHAVIORAL SYMPTOMS/MOOD NEUROLOGICAL BOWEL NUTRITION STATUS        Diet(heart healthy carb modified)  AMBULATORY STATUS COMMUNICATION OF NEEDS Skin     Verbally PU Stage and Appropriate Care(stage II pressure injury buttocks)                       Personal Care Assistance Level of Assistance  Bathing, Feeding, Dressing Bathing Assistance: Maximum assistance Feeding assistance: Independent Dressing Assistance: Limited assistance     Functional Limitations Info  Hearing, Speech, Sight Sight Info: Adequate Hearing Info: Adequate Speech Info: Adequate    SPECIAL CARE FACTORS FREQUENCY  PT (By licensed PT), OT (By licensed OT)     PT Frequency: 5x OT Frequency: 5x            Contractures Contractures Info: Not present    Additional Factors Info  Code Status, Allergies Code Status Info: full code Allergies Info: Penicillins, Antihistamines, Loratadine-type, Other           Current Medications (01/25/2018):  This is the current hospital active medication list Current Facility-Administered Medications  Medication Dose Route Frequency Provider Last Rate Last Dose  . acetaminophen (TYLENOL)  tablet 650 mg  650 mg Oral Q6H PRN Emokpae, Ejiroghene E, MD   650 mg at 01/24/18 2300   Or  . acetaminophen (TYLENOL) suppository 650 mg  650 mg Rectal Q6H PRN Emokpae, Ejiroghene E, MD      . clindamycin (CLEOCIN) IVPB 600 mg  600 mg Intravenous Q8H Emokpae, Ejiroghene E, MD 100 mL/hr at 01/25/18 0545 600 mg at 01/25/18 0545  . enoxaparin (LOVENOX) injection 40 mg  40 mg  Subcutaneous Q24H Emokpae, Ejiroghene E, MD   40 mg at 01/23/18 2245  . furosemide (LASIX) injection 40 mg  40 mg Intravenous Q8H Amin, Ankit Chirag, MD   40 mg at 01/25/18 3254  . Gerhardt's butt cream   Topical QID Damita Lack, MD   1 application at 98/26/41 2130  . insulin aspart (novoLOG) injection 0-9 Units  0-9 Units Subcutaneous TID WC Emokpae, Ejiroghene E, MD   1 Units at 01/24/18 1643  . ondansetron (ZOFRAN) tablet 4 mg  4 mg Oral Q6H PRN Emokpae, Ejiroghene E, MD       Or  . ondansetron (ZOFRAN) injection 4 mg  4 mg Intravenous Q6H PRN Emokpae, Ejiroghene E, MD      . polyethylene glycol (MIRALAX / GLYCOLAX) packet 17 g  17 g Oral Daily PRN Emokpae, Ejiroghene E, MD      . potassium chloride SA (K-DUR,KLOR-CON) CR tablet 20 mEq  20 mEq Oral Daily Emokpae, Ejiroghene E, MD   20 mEq at 01/24/18 0944  . temazepam (RESTORIL) capsule 15 mg  15 mg Oral QHS PRN Schorr, Rhetta Mura, NP   15 mg at 01/24/18 2207     Discharge Medications: Please see discharge summary for a list of discharge medications.  Relevant Imaging Results:  Relevant Lab Results:   Additional Information ssn: [id number]  Nila Nephew, LCSW

## 2018-01-26 ENCOUNTER — Ambulatory Visit: Payer: Self-pay

## 2018-01-26 DIAGNOSIS — C182 Malignant neoplasm of ascending colon: Secondary | ICD-10-CM

## 2018-01-26 LAB — COMPREHENSIVE METABOLIC PANEL
ALK PHOS: 94 U/L (ref 38–126)
ALT: 10 U/L (ref 0–44)
ANION GAP: 11 (ref 5–15)
AST: 14 U/L — ABNORMAL LOW (ref 15–41)
Albumin: 3.6 g/dL (ref 3.5–5.0)
BUN: 26 mg/dL — ABNORMAL HIGH (ref 8–23)
CALCIUM: 8.8 mg/dL — AB (ref 8.9–10.3)
CO2: 27 mmol/L (ref 22–32)
Chloride: 101 mmol/L (ref 98–111)
Creatinine, Ser: 1.35 mg/dL — ABNORMAL HIGH (ref 0.61–1.24)
GFR calc Af Amer: 56 mL/min — ABNORMAL LOW (ref >60)
GFR calc non Af Amer: 48 mL/min — ABNORMAL LOW (ref >60)
Glucose, Bld: 107 mg/dL — ABNORMAL HIGH (ref 70–99)
Potassium: 3.8 mmol/L (ref 3.5–5.1)
Sodium: 139 mmol/L (ref 135–145)
Total Bilirubin: 0.7 mg/dL (ref 0.3–1.2)
Total Protein: 6.8 g/dL (ref 6.5–8.1)

## 2018-01-26 LAB — GLUCOSE, CAPILLARY
GLUCOSE-CAPILLARY: 137 mg/dL — AB (ref 70–99)
Glucose-Capillary: 179 mg/dL — ABNORMAL HIGH (ref 70–99)
Glucose-Capillary: 179 mg/dL — ABNORMAL HIGH (ref 70–99)
Glucose-Capillary: 153 mg/dL — ABNORMAL HIGH (ref 70–99)
Glucose-Capillary: 177 mg/dL — ABNORMAL HIGH (ref 70–99)

## 2018-01-26 LAB — MAGNESIUM: MAGNESIUM: 2.4 mg/dL (ref 1.7–2.4)

## 2018-01-26 MED ORDER — CLINDAMYCIN HCL 300 MG PO CAPS
300.0000 mg | ORAL_CAPSULE | Freq: Four times a day (QID) | ORAL | Status: AC
  Administered 2018-01-26 – 2018-01-27 (×5): 300 mg via ORAL
  Filled 2018-01-26 (×5): qty 1

## 2018-01-26 MED ORDER — FUROSEMIDE 40 MG PO TABS
40.0000 mg | ORAL_TABLET | Freq: Two times a day (BID) | ORAL | Status: DC
  Administered 2018-01-26 – 2018-01-27 (×2): 40 mg via ORAL
  Filled 2018-01-26 (×2): qty 1

## 2018-01-26 NOTE — Care Management Important Message (Signed)
Important Message  Patient Details  Name: KASHTON MCARTOR MRN: [id number] Date of Birth: 1932/09/01   Medicare Important Message Given:  Yes    Tukker Byrns 01/26/2018, 9:16 AM

## 2018-01-26 NOTE — Progress Notes (Signed)
Patient had a 10 beat run of vtach. VSS. Pt asymptomatic. NP on call paged.

## 2018-01-26 NOTE — Progress Notes (Signed)
Patient has bed at [facility] and can dc tomorrow.  LCSW will continue to follow.   [name]

## 2018-01-26 NOTE — Evaluation (Signed)
Occupational Therapy Evaluation Patient Details Name: Joel [name] MRN: [id number] DOB: [birth year] Today's Date: 01/26/2018    History of Present Illness 84 yo with significant medical history, colon cancer, L hip fracture, CAD, COPD, afib presents with increased weakness, shortness of breath, wound on his R toe and wound on his left inner thigh. Pt states he has been declining in general over the past year, has had several hositpalizations, ST-SNF and recnetly HHPT. With other medical inssues during this, pt feels he just has not had time to get fullyrecovered and strong to mange things indpendently safely at home.Marland Kitchen He has been ambulating a little on RW , but with 2 falls in last month, so mostly using a WC for safety in his home at this time.    Clinical Impression   Pt was admitted for the above.  Pt had 2 recent falls and was struggling at home. He will needs STSNF prior to home alone. Goals in acute are for min A level. He needs mostly mod to max A at this time    Follow Up Recommendations  SNF    Equipment Recommendations  None recommended by OT    Recommendations for Other Services       Precautions / Restrictions Precautions Precautions: Fall Restrictions Weight Bearing Restrictions: No      Mobility Bed Mobility               General bed mobility comments: in chair  Transfers   Equipment used: Rolling walker (2 wheeled)   Sit to Stand: Mod assist         General transfer comment: slow transition and posterior LOB when first standing    Balance     Sitting balance-Leahy Scale: Fair       Standing balance-Leahy Scale: Poor                             ADL either performed or assessed with clinical judgement   ADL Overall ADL's : Needs assistance/impaired Eating/Feeding: Independent   Grooming: Set up   Upper Body Bathing: Set up   Lower Body Bathing: Moderate assistance;Sit to/from stand   Upper Body Dressing : Set up    Lower Body Dressing: Maximal assistance;Sit to/from stand                 General ADL Comments: pt was up in chair. Stood briefly for pressure relief. Pt states he has some bed sores.  Pt with initial posterior LOB when first standing     Vision         Perception     Praxis      Pertinent Vitals/Pain Pain Assessment: Faces Faces Pain Scale: Hurts even more Pain Location: bilateral knees R>L, with LE exercises and standing ("I have no cartilage in my knees) Pain Descriptors / Indicators: Grimacing;Sharp Pain Intervention(s): Limited activity within patient's tolerance;Monitored during session;Repositioned     Hand Dominance     Extremity/Trunk Assessment Upper Extremity Assessment Upper Extremity Assessment: Generalized weakness;RUE deficits/detail RUE Deficits / Details: pt states he hurt R shoulder in falls. Abducts to 90 but states this hurts. Encouragd him to work in pain free zone           Corporate investment banker Communication: No difficulties   Cognition Arousal/Alertness: Awake/alert Behavior During Therapy: WFL for tasks assessed/performed Overall Cognitive Status: Within Functional Limits for tasks assessed  General Comments: pt states he is fighting depression. Very cooperative    General Comments       Exercises     Shoulder Instructions      Home Living Family/patient expects to be discharged to:: Skilled nursing facility Living Arrangements: Alone                               Additional Comments: uses RW occassionaly in the home with HHPT and sometimes Independently, however has been using more WC in last few weeks due to weakness, knee pain and falls. Wears slip on crocs, no socks      Prior Functioning/Environment Level of Independence: Independent with assistive device(s)        Comments: was using WC and RW , HHPT was helping him to use RW , but just didn't seem like  enough rehab at the time and had some medical issues that slowed things down too.,        OT Problem List: Decreased strength;Decreased activity tolerance;Impaired balance (sitting and/or standing);Decreased knowledge of use of DME or AE;Pain      OT Treatment/Interventions: Self-care/ADL training;DME and/or AE instruction;Patient/family education;Balance training;Therapeutic activities    OT Goals(Current goals can be found in the care plan section) Acute Rehab OT Goals Patient Stated Goal: " I want to be able to stay in my home as long as i can , but know I need some rehab before i can do so. I am willing to work hard to get stronger and able again" OT Goal Formulation: With patient Time For Goal Achievement: 02/09/18 Potential to Achieve Goals: Good ADL Goals Pt Will Perform Lower Body Bathing: with min assist;sit to/from stand;with adaptive equipment Pt Will Perform Lower Body Dressing: with min assist;sit to/from stand;with adaptive equipment Pt Will Transfer to Toilet: with min assist;bedside commode;stand pivot transfer Pt Will Perform Toileting - Clothing Manipulation and hygiene: with min assist;sit to/from stand Additional ADL Goal #1: pt will perform bed mobility at min A level in preparation for adls  OT Frequency: Min 2X/week   Barriers to D/C:            Co-evaluation              AM-PAC OT "6 Clicks" Daily Activity     Outcome Measure Help from another person eating meals?: None Help from another person taking care of personal grooming?: A Little Help from another person toileting, which includes using toliet, bedpan, or urinal?: A Lot Help from another person bathing (including washing, rinsing, drying)?: A Lot Help from another person to put on and taking off regular upper body clothing?: A Little Help from another person to put on and taking off regular lower body clothing?: A Lot 6 Click Score: 16   End of Session    Activity Tolerance: Patient  tolerated treatment well Patient left: in chair;with call bell/phone within reach(alarm pad not in chair; pt will call for A)  OT Visit Diagnosis: Unsteadiness on feet (R26.81);Muscle weakness (generalized) (M62.81);Pain Pain - part of body: Knee(bil)                Time: 7169-6789 OT Time Calculation (min): 16 min Charges:  OT General Charges $OT Visit: 1 Visit OT Evaluation $OT Eval Low Complexity: [place], OTR/L Acute Rehabilitation Services [phone] [facility] pager [phone] office 01/26/2018  [place] 01/26/2018, 9:17 AM

## 2018-01-26 NOTE — Progress Notes (Signed)
Physical Therapy Treatment Patient Details Name: Joel [name] MRN: [id number] DOB: 06/19/1932 Today's Date: 01/26/2018    History of Present Illness 84 yo with significant medical history, colon cancer, L hip fracture, CAD, COPD, afib presents with increased weakness, shortness of breath, wound on his R toe and wound on his left inner thigh. Pt states he has been declining in general over the past year, has had several hositpalizations, ST-SNF and recnetly HHPT. With other medical inssues during this, pt feels he just has not had time to get fullyrecovered and strong to mange things indpendently safely at home.Marland Kitchen He has been ambulating a little on RW , but with 2 falls in last month, so mostly using a WC for safety in his home at this time.     PT Comments    Pt motivated to progress mobility today. Pt ambulated short distance in room, limited by LE weakness, bilateral relatively fixed knee flexion in standing R>L, and fatigue. Pt with continued depressed-like affect this session. PT to continue to follow acutely and will progress mobility as able.    Follow Up Recommendations  SNF     Equipment Recommendations  None recommended by PT    Recommendations for Other Services       Precautions / Restrictions Precautions Precautions: Fall Restrictions Weight Bearing Restrictions: No    Mobility  Bed Mobility Overal bed mobility: Needs Assistance             General bed mobility comments: pt up in chair upon arrival to room with LEs in dependent position  Transfers Overall transfer level: Needs assistance Equipment used: Rolling walker (2 wheeled) Transfers: Sit to/from Stand Sit to Stand: Mod assist         General transfer comment: Mod assist for power up, increased time to perform hip extension in standing.   Ambulation/Gait Ambulation/Gait assistance: Min assist Gait Distance (Feet): 10 Feet Assistive device: Rolling walker (2 wheeled) Gait Pattern/deviations:  Step-to pattern;Decreased stance time - right;Decreased weight shift to right;Antalgic;Trunk flexed Gait velocity: decr    General Gait Details: Min assist for steadying, assisting with directing RW if needed. Pt with ~40* knee flexion in RLE, stating he could not stand straight on it due to arthritic changes. Pt limited by fatigue.    Stairs             Wheelchair Mobility    Modified Rankin (Stroke Patients Only)       Balance Overall balance assessment: Needs assistance;History of Falls Sitting-balance support: No upper extremity supported Sitting balance-Leahy Scale: Fair     Standing balance support: Bilateral upper extremity supported Standing balance-Leahy Scale: Poor Standing balance comment: relies on UE support and PT for steadying                             Cognition Arousal/Alertness: Awake/alert Behavior During Therapy: WFL for tasks assessed/performed Overall Cognitive Status: Within Functional Limits for tasks assessed                                        Exercises General Exercises - Lower Extremity Long Arc Quad: AAROM;Both;15 reps;Seated Hip Flexion/Marching: AROM;Both;10 reps;Seated Toe Raises: AROM;Both;Seated;15 reps Heel Raises: AROM;Both;Seated;15 reps    General Comments General comments (skin integrity, edema, etc.): with AAROM, pt lacking ~20* R knee extension and ~15* L knee extension. With WB,  pt lacking ~40* knee extension in RLE and LLE with slight bend. Pt with continued pitting edema and tightness in bilateral LEs.       Pertinent Vitals/Pain Pain Assessment: Faces Faces Pain Scale: Hurts even more Pain Location: bilateral knees/legs  Pain Descriptors / Indicators: Grimacing;Aching Pain Intervention(s): Limited activity within patient's tolerance;Repositioned;RN gave pain meds during session    Home Living                      Prior Function            PT Goals (current goals can now  be found in the care plan section) Acute Rehab PT Goals Patient Stated Goal: " I want to be able to stay in my home as long as i can , but know I need some rehab before i can do so. I am willing to work hard to get stronger and able again" PT Goal Formulation: With patient Time For Goal Achievement: 02/06/18 Potential to Achieve Goals: Good Progress towards PT goals: Progressing toward goals    Frequency    Min 2X/week      PT Plan Current plan remains appropriate    Co-evaluation              AM-PAC PT "6 Clicks" Mobility   Outcome Measure  Help needed turning from your back to your side while in a flat bed without using bedrails?: A Lot Help needed moving from lying on your back to sitting on the side of a flat bed without using bedrails?: A Lot Help needed moving to and from a bed to a chair (including a wheelchair)?: A Lot Help needed standing up from a chair using your arms (e.g., wheelchair or bedside chair)?: A Lot Help needed to walk in hospital room?: A Lot Help needed climbing 3-5 steps with a railing? : Total 6 Click Score: 11    End of Session Equipment Utilized During Treatment: Gait belt Activity Tolerance: Patient limited by pain;Patient limited by fatigue Patient left: in chair;with call bell/phone within reach;with family/visitor present(with LEs elevated due to edema ) Nurse Communication: Mobility status PT Visit Diagnosis: Other abnormalities of gait and mobility (R26.89);Muscle weakness (generalized) (M62.81)     Time: 1448-1856 PT Time Calculation (min) (ACUTE ONLY): 28 min  Charges:  $Gait Training: 8-22 mins $Therapeutic Exercise: 8-22 mins                     Joel [name], PT Acute Rehabilitation Services Pager [phone]  Office [phone]   Joel [name] 01/26/2018, 1:57 PM

## 2018-01-26 NOTE — Progress Notes (Signed)
PROGRESS NOTE    Joel [name]  MHD:[id number] DOB: [birth year] DOA: 01/23/2018 PCP: Wenda Low, MD   Brief Narrative:  84 year old with history of COPD, CHF, atrial fibrillation, obstructive sleep apnea, colon cancer status post resection, diabetes mellitus type 2 came to the ER with multiple complaints including scrotal swelling for 1 week, left thigh wound after spilling coffee on it and also worsening of the right lower toe pain swelling and drainage.  Initially found to be in fluid overload.  Right foot foot x-ray was negative, pelvic x-ray was negative.  He was started on IV clindamycin.   Assessment & Plan:   Principal Problem:   CHF exacerbation ([place]) Active Problems:   DM2 (diabetes mellitus, type 2) (HCC)   Bipolar I disorder ([place])   BPPV (benign paroxysmal positional vertigo)   Hypertension   HLD (hyperlipidemia)   COPD (chronic obstructive pulmonary disease) (HCC)   Cancer of ascending colon (HCC)  Fluid overload with scrotal swelling Acute on chronic diastolic congestive heart failure, grade 1, class III -Echocardiogram 09/2017 ejection fraction 55 to 60%, moderate LVH -Diuresing well with IV lasix. Will swtich him to PO lasix 40mg  po bid today and see how he responds.  Strict input and output -Fluid restriction 1800 cc  Left thigh wound appears to be infected Right lower extremity toe erythema, swelling and drainage - Swtich to PO clindamycin, allergic to penicillin -Provide supportive care.  Local wound care.  Add Florastor - Wound care team recommends- mattress placement, and Xeroform gauze dressing changes twice daily -Will need outpatient follow-up with wound care and podiatry upon discharge.  Diabetes mellitus type 2 -Hemoglobin A1c 09/2017-6.1.  Holding home metformin and glipizide -Insulin sliding scale and Accu-Chek  Atrial fibrillation, paroxysmal -Rate control strategy without anticoagulation due to previous history of GI bleeding  COPD with  obstructive sleep apnea -CPAP at bedtime  Stage III pressure ulcer on his left buttock present prior to admission  Chronic hypoxia on 2 L nasal cannula at home  Generalized weakness-PT/OT - SNF  DVT prophylaxis: Lovenox Code Status: Full Family Communication: Family at bedside Disposition Plan: Maintain hospital stay to watch his response on PO medications today.   Consultants:  Wound care  Procedures:   None  Antimicrobials:   Clindamycin day 4   Subjective: Feels like his LE swelling and scrotal swelling is better. SOB isnt as bad but still not back to his baseline.   Review of Systems Otherwise negative except as per HPI, including: General = no fevers, chills, dizziness, malaise, fatigue HEENT/EYES = negative for pain, redness, loss of vision, double vision, blurred vision, loss of hearing, sore throat, hoarseness, dysphagia Cardiovascular= negative for chest pain, palpitation, murmurs, lower extremity swelling Respiratory/lungs= negative for  cough, hemoptysis, wheezing, mucus production Gastrointestinal= negative for nausea, vomiting,, abdominal pain, melena, hematemesis Genitourinary= negative for Dysuria, Hematuria, Change in Urinary Frequency MSK = Negative for arthralgia, myalgias, Back Pain, Joint swelling  Neurology= Negative for headache, seizures, numbness, tingling  Psychiatry= Negative for anxiety, depression, suicidal and homocidal ideation Allergy/Immunology= Medication/Food allergy as listed  Skin= Negative for Rash, lesions, ulcers, itching    Objective: Vitals:   01/25/18 1457 01/25/18 2126 01/25/18 2230 01/26/18 0449  BP: 124/71 (!) 141/70  (!) 141/86  Pulse: 78 80  84  Resp:  (!) 26 20 20   Temp: 98 F (36.7 C) 98.1 F (36.7 C)  98.4 F (36.9 C)  TempSrc: Oral Oral  Oral  SpO2: 92% 95%  93%  Weight:  Height:        Intake/Output Summary (Last 24 hours) at 01/26/2018 1027 Last data filed at 01/26/2018 0177 Gross per 24 hour    Intake 1200 ml  Output 1800 ml  Net -600 ml   Filed Weights   01/23/18 2000 01/24/18 0553 01/24/18 1339  Weight: 119.6 kg 115.3 kg (!) 143.3 kg    Examination: Constitutional: NAD, calm, comfortable Eyes: PERRL, lids and conjunctivae normal ENMT: Mucous membranes are moist. Posterior pharynx clear of any exudate or lesions.Normal dentition.  Neck: normal, supple, no masses, no thyromegaly Respiratory: clear to auscultation bilaterally, no wheezing, no crackles. Normal respiratory effort. No accessory muscle use.  Cardiovascular: Regular rate and rhythm, no murmurs / rubs / gallops. 1+ b/l pitting extremity edema. 2+ pedal pulses. No carotid bruits.  Abdomen: no tenderness, no masses palpated. No hepatosplenomegaly. Bowel sounds positive.  Musculoskeletal: no clubbing / cyanosis. No joint deformity upper and lower extremities. Good ROM, no contractures. Normal muscle tone.  Skin: local dressing notes on his left thigh, slightly previous trauma notes on his right toe with surroudning erythema Neurologic: CN 2-12 grossly intact. Sensation intact, DTR normal. Strength 4/5 in all 4.  Psychiatric: Normal judgment and insight. Alert and oriented x 3. Normal mood.  Swollen scrotum  Data Reviewed:   CBC: Recent Labs  Lab 01/23/18 1409  WBC 4.5  NEUTROABS 3.4  HGB 9.0*  HCT 32.6*  MCV 85.3  PLT 939   Basic Metabolic Panel: Recent Labs  Lab 01/23/18 1409 01/24/18 0134 01/26/18 0508  NA 140 140 139  K 4.4 4.1 3.8  CL 109 105 101  CO2 26 24 27   GLUCOSE 90 109* 107*  BUN 20 21 26*  CREATININE 1.16 1.26* 1.35*  CALCIUM 8.7* 8.9 8.8*  MG  --   --  2.4   GFR: Estimated Creatinine Clearance: 60.9 mL/min (A) (by C-G formula based on SCr of 1.35 mg/dL (H)). Liver Function Tests: Recent Labs  Lab 01/26/18 0508  AST 14*  ALT 10  ALKPHOS 94  BILITOT 0.7  PROT 6.8  ALBUMIN 3.6   No results for input(s): LIPASE, AMYLASE in the last 168 hours. No results for input(s):  AMMONIA in the last 168 hours. Coagulation Profile: No results for input(s): INR, PROTIME in the last 168 hours. Cardiac Enzymes: Recent Labs  Lab 01/23/18 1948 01/24/18 0134 01/24/18 0550  TROPONINI <0.03 <0.03 <0.03   BNP (last 3 results) No results for input(s): PROBNP in the last 8760 hours. HbA1C: No results for input(s): HGBA1C in the last 72 hours. CBG: Recent Labs  Lab 01/25/18 0902 01/25/18 1151 01/25/18 1630 01/25/18 2137 01/26/18 0819  GLUCAP 107* 120* 139* 179* 179*   Lipid Profile: No results for input(s): CHOL, HDL, LDLCALC, TRIG, CHOLHDL, LDLDIRECT in the last 72 hours. Thyroid Function Tests: No results for input(s): TSH, T4TOTAL, FREET4, T3FREE, THYROIDAB in the last 72 hours. Anemia Panel: No results for input(s): VITAMINB12, FOLATE, FERRITIN, TIBC, IRON, RETICCTPCT in the last 72 hours. Sepsis Labs: No results for input(s): PROCALCITON, LATICACIDVEN in the last 168 hours.  No results found for this or any previous visit (from the past 240 hour(s)).       Radiology Studies: No results found.      Scheduled Meds: . enoxaparin (LOVENOX) injection  40 mg Subcutaneous Q24H  . Gerhardt's butt cream   Topical QID  . insulin aspart  0-9 Units Subcutaneous TID WC  . potassium chloride SA  20 mEq Oral Daily  .  saccharomyces boulardii  250 mg Oral BID   Continuous Infusions: . clindamycin (CLEOCIN) IV 600 mg (01/26/18 0746)     LOS: 3 days   Time spent= 25 mins    Ryver Poblete Arsenio Loader, MD [facility] Pager [phone]   If 7PM-7AM, please contact night-coverage [email or web address] Password [facility] 01/26/2018, 10:27 AM

## 2018-01-27 DIAGNOSIS — E46 Unspecified protein-calorie malnutrition: Secondary | ICD-10-CM | POA: Diagnosis not present

## 2018-01-27 DIAGNOSIS — I509 Heart failure, unspecified: Secondary | ICD-10-CM | POA: Diagnosis not present

## 2018-01-27 DIAGNOSIS — H8110 Benign paroxysmal vertigo, unspecified ear: Secondary | ICD-10-CM | POA: Diagnosis not present

## 2018-01-27 DIAGNOSIS — R278 Other lack of coordination: Secondary | ICD-10-CM | POA: Diagnosis not present

## 2018-01-27 DIAGNOSIS — L89323 Pressure ulcer of left buttock, stage 3: Secondary | ICD-10-CM | POA: Diagnosis not present

## 2018-01-27 DIAGNOSIS — S91101A Unspecified open wound of right great toe without damage to nail, initial encounter: Secondary | ICD-10-CM | POA: Diagnosis not present

## 2018-01-27 DIAGNOSIS — J449 Chronic obstructive pulmonary disease, unspecified: Secondary | ICD-10-CM | POA: Diagnosis not present

## 2018-01-27 DIAGNOSIS — C189 Malignant neoplasm of colon, unspecified: Secondary | ICD-10-CM | POA: Diagnosis not present

## 2018-01-27 DIAGNOSIS — E119 Type 2 diabetes mellitus without complications: Secondary | ICD-10-CM | POA: Diagnosis not present

## 2018-01-27 DIAGNOSIS — M2550 Pain in unspecified joint: Secondary | ICD-10-CM | POA: Diagnosis not present

## 2018-01-27 DIAGNOSIS — S91102D Unspecified open wound of left great toe without damage to nail, subsequent encounter: Secondary | ICD-10-CM | POA: Diagnosis not present

## 2018-01-27 DIAGNOSIS — L89153 Pressure ulcer of sacral region, stage 3: Secondary | ICD-10-CM | POA: Diagnosis not present

## 2018-01-27 DIAGNOSIS — I5033 Acute on chronic diastolic (congestive) heart failure: Secondary | ICD-10-CM | POA: Diagnosis not present

## 2018-01-27 DIAGNOSIS — I4819 Other persistent atrial fibrillation: Secondary | ICD-10-CM | POA: Diagnosis not present

## 2018-01-27 DIAGNOSIS — E1169 Type 2 diabetes mellitus with other specified complication: Secondary | ICD-10-CM | POA: Diagnosis not present

## 2018-01-27 DIAGNOSIS — I4891 Unspecified atrial fibrillation: Secondary | ICD-10-CM | POA: Diagnosis not present

## 2018-01-27 DIAGNOSIS — E1140 Type 2 diabetes mellitus with diabetic neuropathy, unspecified: Secondary | ICD-10-CM | POA: Diagnosis not present

## 2018-01-27 DIAGNOSIS — S91102A Unspecified open wound of left great toe without damage to nail, initial encounter: Secondary | ICD-10-CM | POA: Diagnosis not present

## 2018-01-27 DIAGNOSIS — E11621 Type 2 diabetes mellitus with foot ulcer: Secondary | ICD-10-CM | POA: Diagnosis not present

## 2018-01-27 DIAGNOSIS — L03116 Cellulitis of left lower limb: Secondary | ICD-10-CM | POA: Diagnosis not present

## 2018-01-27 DIAGNOSIS — Z79899 Other long term (current) drug therapy: Secondary | ICD-10-CM | POA: Diagnosis not present

## 2018-01-27 DIAGNOSIS — L03119 Cellulitis of unspecified part of limb: Secondary | ICD-10-CM | POA: Diagnosis not present

## 2018-01-27 DIAGNOSIS — I10 Essential (primary) hypertension: Secondary | ICD-10-CM | POA: Diagnosis not present

## 2018-01-27 DIAGNOSIS — J9611 Chronic respiratory failure with hypoxia: Secondary | ICD-10-CM | POA: Diagnosis not present

## 2018-01-27 DIAGNOSIS — Z7401 Bed confinement status: Secondary | ICD-10-CM | POA: Diagnosis not present

## 2018-01-27 DIAGNOSIS — E1142 Type 2 diabetes mellitus with diabetic polyneuropathy: Secondary | ICD-10-CM | POA: Diagnosis not present

## 2018-01-27 DIAGNOSIS — E7849 Other hyperlipidemia: Secondary | ICD-10-CM | POA: Diagnosis not present

## 2018-01-27 DIAGNOSIS — F319 Bipolar disorder, unspecified: Secondary | ICD-10-CM | POA: Diagnosis not present

## 2018-01-27 DIAGNOSIS — C182 Malignant neoplasm of ascending colon: Secondary | ICD-10-CM | POA: Diagnosis not present

## 2018-01-27 DIAGNOSIS — E11628 Type 2 diabetes mellitus with other skin complications: Secondary | ICD-10-CM | POA: Diagnosis not present

## 2018-01-27 DIAGNOSIS — L89302 Pressure ulcer of unspecified buttock, stage 2: Secondary | ICD-10-CM | POA: Diagnosis not present

## 2018-01-27 DIAGNOSIS — L89329 Pressure ulcer of left buttock, unspecified stage: Secondary | ICD-10-CM | POA: Diagnosis not present

## 2018-01-27 DIAGNOSIS — R2689 Other abnormalities of gait and mobility: Secondary | ICD-10-CM | POA: Diagnosis not present

## 2018-01-27 DIAGNOSIS — G4733 Obstructive sleep apnea (adult) (pediatric): Secondary | ICD-10-CM | POA: Diagnosis not present

## 2018-01-27 DIAGNOSIS — S91101D Unspecified open wound of right great toe without damage to nail, subsequent encounter: Secondary | ICD-10-CM | POA: Diagnosis not present

## 2018-01-27 DIAGNOSIS — E785 Hyperlipidemia, unspecified: Secondary | ICD-10-CM | POA: Diagnosis not present

## 2018-01-27 LAB — COMPREHENSIVE METABOLIC PANEL
ALK PHOS: 91 U/L (ref 38–126)
ALT: 10 U/L (ref 0–44)
AST: 17 U/L (ref 15–41)
Albumin: 3.9 g/dL (ref 3.5–5.0)
Anion gap: 10 (ref 5–15)
BUN: 23 mg/dL (ref 8–23)
CALCIUM: 9 mg/dL (ref 8.9–10.3)
CO2: 30 mmol/L (ref 22–32)
Chloride: 102 mmol/L (ref 98–111)
Creatinine, Ser: 1.29 mg/dL — ABNORMAL HIGH (ref 0.61–1.24)
GFR calc Af Amer: 59 mL/min — ABNORMAL LOW (ref >60)
GFR, EST NON AFRICAN AMERICAN: 51 mL/min — AB (ref >60)
Glucose, Bld: 111 mg/dL — ABNORMAL HIGH (ref 70–99)
Potassium: 3.9 mmol/L (ref 3.5–5.1)
Sodium: 142 mmol/L (ref 135–145)
Total Bilirubin: 0.7 mg/dL (ref 0.3–1.2)
Total Protein: 7.4 g/dL (ref 6.5–8.1)

## 2018-01-27 LAB — MAGNESIUM: Magnesium: 2.2 mg/dL (ref 1.7–2.4)

## 2018-01-27 LAB — GLUCOSE, CAPILLARY
Glucose-Capillary: 116 mg/dL — ABNORMAL HIGH (ref 70–99)
Glucose-Capillary: 116 mg/dL — ABNORMAL HIGH (ref 70–99)

## 2018-01-27 MED ORDER — SACCHAROMYCES BOULARDII 250 MG PO CAPS: 250.0000 mg | ORAL_CAPSULE | Freq: Two times a day (BID) | ORAL | 0 refills | Status: AC

## 2018-01-27 MED ORDER — CLINDAMYCIN HCL 300 MG PO CAPS: 300.0000 mg | ORAL_CAPSULE | Freq: Four times a day (QID) | ORAL | 0 refills | Status: AC

## 2018-01-27 NOTE — Consult Note (Addendum)
   [facility] Inpatient Consult   01/27/2018  Joel [name] 1932-09-19 [id number]    Joel [name] is active with [facility] services. He has been followed by [facility]. Please see chart review then encounters for patient outreach details.   Made aware of patient's hospitalization by [facility].   Joel [name] disposition plans are for [facility] SN[facility]. Will update [facility] RNCM and make referral for [facility] LCSW since patient was active with [facility] prior to hospitalization.  Made inpatient RNCM aware [facility] is active.    Marthenia Rolling, MSN-Ed, RN,BSN [facility] Liaison [phone]

## 2018-01-27 NOTE — Progress Notes (Signed)
PTAR picked patient up for discharge to [facility].  Patient stable at time of disharge.  Packet sent with patient.

## 2018-01-27 NOTE — Progress Notes (Addendum)
Patient's IV removed.  Site WNL.  Report called to [facility], LPN.  Patient stable awaiting discharge to [facility] scheduled for 1430 per SW.    Family reports bringing box of medications to hospital day patient was admitted.   No med check-in sheet in patient's chart.  RN called pharmacy but no medications were checked into pharmacy. RN called security but security said that all medications would have been sent to the pharmacy.  Patient reports it was a home mediation box with the days of the week on the box.  Charge Nurse notified.

## 2018-01-27 NOTE — Discharge Summary (Signed)
Physician Discharge [place] JHE:[id number] DOB: May 28, 1932 DOA: 01/23/2018  PCP: Wenda Low, MD  Admit date: 01/23/2018 Discharge date: 01/27/2018  Admitted From: Home Disposition: Skilled nursing facility  Recommendations for Outpatient Follow-up:  1. Follow up with PCP in 1-2 weeks 2. Please obtain BMP/CBC in one week your next doctors visit.  3. Take oral clindamycin 4 times daily for 5 days 4. Florastor has been prescribed 5. Needs outpatient follow-up with podiatry and wound care, referrals have been made 6. On his left thigh would advise Xeroform gauze twice daily changes.  Routine care for his right lower extremity to.  Discharge Condition: Stable CODE STATUS: Full code Diet recommendation: Heart healthy  Brief/Interim Summary: 84 year old with history of COPD, CHF, atrial fibrillation, obstructive sleep apnea, colon cancer status post resection, diabetes mellitus type 2 came to the ER with multiple complaints including scrotal swelling for 1 week, left thigh wound after spilling coffee on it and also worsening of the right lower toe pain swelling and drainage.  Initially found to be in fluid overload.  Right foot foot x-ray was negative, pelvic x-ray was negative.  He was started on IV clindamycin.  During hospitalization he was diuresed aggressively with Lasix and eventually switched to Lasix 40 mg twice daily.  His IV clindamycin was also transitioned to p.o.  Patient was seen by wound care team and referral to outpatient wound care team and podiatry were made. Today patient has reached max benefit from hospital stay and stable for discharge.  He will follow-up outpatient with recommendations as stated above.   Discharge Diagnoses:  Principal Problem:   CHF exacerbation ([place]) Active Problems:   DM2 (diabetes mellitus, type 2) (HCC)   Bipolar I disorder ([place])   BPPV (benign paroxysmal positional vertigo)   Hypertension   HLD (hyperlipidemia)   COPD  (chronic obstructive pulmonary disease) (HCC)   Cancer of ascending colon (HCC)  Acute on chronic diastolic congestive heart failure, grade 1, class III -Echocardiogram 09/2017 ejection fraction 55 to 60%, moderate LVH -Patient diuresed well, today appears to be euvolemic in nature.  We will transition him to oral Lasix.  Advised him to comply with this fluid intake.  Needs to follow-up with his cardiology  Left thigh wound appears to be infected Right lower extremity toe erythema, swelling and drainage - Continue oral clindamycin for 5 more days.  Take Florastor to help replenish gut flora - Wound care team recommends- mattress placement, and Xeroform gauze dressing changes twice daily -Will need outpatient follow-up with wound care and podiatry upon discharge.  Diabetes mellitus type 2 -Hemoglobin A1c 09/2017-6.1.  Holding home metformin and glipizide -Insulin sliding scale and Accu-Chek  Atrial fibrillation, paroxysmal -Rate control strategy without anticoagulation due to previous history of GI bleeding  COPD with obstructive sleep apnea -CPAP at bedtime  Stage III pressure ulcer on his left buttock present prior to admission  Chronic hypoxia on 2 L nasal cannula at home  Generalized weakness-PT/OT - SNF  Patient was on Lovenox during the hospitalization He is full code Family is at bedside at the time of discharge. Discharge today to skilled nursing facility.  Discharge Instructions  Discharge Instructions    AMB Referral to [facility]   Complete by:  As directed    Please assign to [facility] LCSW for follow up at [facility] SNF. Was active with [facility] RNCM prior to hospitalization. Please re-refer back to [facility] upon discharge from SNF. Currently at [facility]. Please call  with questions. Thanks. Marthenia Rolling, [place], [facility] [phone]   Reason for consult:  Please assign to [facility] LCSW   Diagnoses of:   Heart  Failure COPD/ Pneumonia     Expected date of contact:  1-3 days (reserved for hospital discharges)     Allergies as of 01/27/2018      Reactions   Penicillins Anaphylaxis   Has patient had a PCN reaction causing immediate rash, facial/tongue/throat swelling, SOB or lightheadedness with hypotension: unknown Has patient had a PCN reaction causing severe rash involving mucus membranes or skin necrosis: unknown Has patient had a PCN reaction that required hospitalization: unknown Has patient had a PCN reaction occurring within the last 10 years: no If all of the above answers are "NO", then may proceed with Cephalosporin use.   Antihistamines, Loratadine-type Other (See Comments)   depression   Other    Beta blockers-Novacaine  Caused depression      Medication List    STOP taking these medications   B-complex with vitamin C tablet   iron polysaccharides 150 MG capsule Commonly known as:  NIFEREX     TAKE these medications   allopurinol 100 MG tablet Commonly known as:  ZYLOPRIM Take 100 mg by mouth daily.   buPROPion 150 MG 24 hr tablet Commonly known as:  WELLBUTRIN XL Take 1 tablet (150 mg total) by mouth daily.   citalopram 20 MG tablet Commonly known as:  CELEXA Take 1 tablet (20 mg total) by mouth daily.   clindamycin 300 MG capsule Commonly known as:  CLEOCIN Take 1 capsule (300 mg total) by mouth every 6 (six) hours for 5 days.   diclofenac sodium 1 % Gel Commonly known as:  VOLTAREN Apply 1 application topically 4 (four) times daily. Pt uses once daily   diltiazem 120 MG 24 hr capsule Commonly known as:  CARDIZEM CD Take 1 capsule (120 mg total) by mouth daily.   fluticasone 50 MCG/ACT nasal spray Commonly known as:  FLONASE Place 2 sprays into both nostrils daily. What changed:    when to take this  reasons to take this   furosemide 80 MG tablet Commonly known as:  LASIX Take 1 tablet (80 mg total) by mouth daily. What changed:  how much to  take   gabapentin 100 MG capsule Commonly known as:  NEURONTIN Take 2 capsules (200 mg total) by mouth 2 (two) times daily.   glimepiride 1 MG tablet Commonly known as:  AMARYL Take 1 mg by mouth daily with breakfast.   guaifenesin 400 MG Tabs tablet Commonly known as:  HUMIBID E Take 400 mg by mouth every 12 (twelve) hours as needed (for congestion, OTC).   lovastatin 20 MG tablet Commonly known as:  MEVACOR Take 20 mg by mouth at bedtime.   meclizine 25 MG tablet Commonly known as:  ANTIVERT Take 1 tablet (25 mg total) by mouth 3 (three) times daily as needed for dizziness. What changed:  when to take this   OXYGEN Inhale 2 L into the lungs as needed (shortness of breath).   pantoprazole 40 MG tablet Commonly known as:  PROTONIX Take 1 tablet (40 mg total) by mouth 2 (two) times daily.   potassium chloride SA 20 MEQ tablet Commonly known as:  K-DUR,KLOR-CON Take 20 mEq by mouth daily.   saccharomyces boulardii 250 MG capsule Commonly known as:  FLORASTOR Take 1 capsule (250 mg total) by mouth 2 (two) times daily for 10 days.   sodium  chloride 0.65 % Soln nasal spray Commonly known as:  OCEAN Place 1 spray into both nostrils as needed for congestion.   temazepam 15 MG capsule Commonly known as:  RESTORIL Take 1 capsule (15 mg total) by mouth at bedtime.   VISION FORMULA PO Take 1 tablet by mouth daily.   VITAMIN C PO Take 1,000 mg by mouth daily.       Contact information for follow-up providers    Wenda Low, MD. Schedule an appointment as soon as possible for a visit in 2 week(s).   Specialty:  Internal Medicine Contact information: [address], [address] [place] [address] [phone]        Edrick Kins, DPM. Schedule an appointment as soon as possible for a visit in 2 week(s).   Specialty:  Health visitor information: [address] [place] [address] [place] [address] [phone]        [facility]             . Schedule an appointment as soon as possible for a visit in 1 week(s).   Contact information: [address] [phone]           Contact information for after-discharge care    Destination    [facility] Preferred SNF .   Service:  Skilled Nursing Contact information: [place] [phone]                 Allergies  Allergen Reactions  . Penicillins Anaphylaxis    Has patient had a PCN reaction causing immediate rash, facial/tongue/throat swelling, SOB or lightheadedness with hypotension: unknown Has patient had a PCN reaction causing severe rash involving mucus membranes or skin necrosis: unknown Has patient had a PCN reaction that required hospitalization: unknown Has patient had a PCN reaction occurring within the last 10 years: no If all of the above answers are "NO", then may proceed with Cephalosporin use.   Marland Kitchen Antihistamines, Loratadine-Type Other (See Comments)    depression  . Other     Beta blockers-Novacaine  Caused depression    You were cared for by a hospitalist during your hospital stay. If you have any questions about your discharge medications or the care you received while you were in the hospital after you are discharged, you can call the unit and asked to speak with the hospitalist on call if the hospitalist that took care of you is not available. Once you are discharged, your primary care physician will handle any further medical issues. Please note that no refills for any discharge medications will be authorized once you are discharged, as it is imperative that you return to your primary care physician (or establish a relationship with a primary care physician if you do not have one) for your aftercare needs so that they can reassess your need for medications and monitor your lab  values.  Consultations:  None   Procedures/Studies: Dg Chest Port 1 View  Result Date: 01/23/2018 CLINICAL DATA:  Shortness of breath.  Toe infection.  Diabetes. EXAM: PORTABLE CHEST 1 VIEW COMPARISON:  09/11/2017. FINDINGS: Prior median sternotomy. Cardiomegaly with diffuse bilateral pulmonary interstitial prominence and bilateral pleural effusions. No pneumothorax. IMPRESSION: Cardiomegaly with bilateral pulmonary interstitial prominence and bilateral pleural effusions consistent with CHF. Electronically Signed   By: Marcello Moores  Register   On: 01/23/2018 14:41   Dg Foot Complete Right  Result Date:  01/23/2018 CLINICAL DATA:  Great toe infection. EXAM: RIGHT FOOT COMPLETE - 3+ VIEW COMPARISON:  None. FINDINGS: The bones appear adequately mineralized. There is some soft tissue swelling medially in the forefoot. No soft tissue emphysema, foreign body or bone destruction identified. There are mild degenerative changes in the midfoot and at the 1st metatarsophalangeal and interphalangeal joints. Vascular calcifications are noted. IMPRESSION: No radiographic evidence of osteomyelitis. Medial forefoot soft tissue swelling. Electronically Signed   By: Richardean Sale M.D.   On: 01/23/2018 14:43   Dg Hip Unilat With Pelvis 2-3 Views Right  Result Date: 01/23/2018 CLINICAL DATA:  Right hip pain.  Fall in. EXAM: DG HIP (WITH OR WITHOUT PELVIS) 2-3V RIGHT COMPARISON:  03/22/2017 FINDINGS: Chronic impacted right subcapital femoral neck fracture identified status post hip pinning. There is no evidence of acute hip fracture or dislocation. There is no evidence of arthropathy or other focal bone abnormality. IMPRESSION: 1. No acute findings. 2. Status post right hip pinning for subcapital femoral neck fracture. Electronically Signed   By: Kerby Moors M.D.   On: 01/23/2018 16:01      Subjective: Feels symptomatically much better.   Discharge Exam: Vitals:   01/27/18 0453 01/27/18 0454  BP: (!)  145/104 (!) 143/94  Pulse: (!) 104 98  Resp: 18   Temp: 97.8 F (36.6 C)   SpO2: 94% 93%   Vitals:   01/26/18 2140 01/27/18 0453 01/27/18 0454 01/27/18 0535  BP:  (!) 145/104 (!) 143/94   Pulse:  (!) 104 98   Resp: 18 18    Temp:  97.8 F (36.6 C)    TempSrc:  Oral    SpO2:  94% 93%   Weight:    106.3 kg  Height:        General: Pt is alert, awake, not in acute distress Cardiovascular: RRR, S1/S2 +, no rubs, no gallops Respiratory: CTA bilaterally, no wheezing, no rhonchi Abdominal: Soft, NT, ND, bowel sounds + Extremities: 1+ bilateral lower extremity pitting edema Mild scrotal edema Local wound care/dressing noted on his left thigh and his right toe.    The results of significant diagnostics from this hospitalization (including imaging, microbiology, ancillary and laboratory) are listed below for reference.     Microbiology: No results found for this or any previous visit (from the past 240 hour(s)).   Labs: BNP (last 3 results) Recent Labs    09/01/17 0037 01/23/18 1409  BNP 237.1* 010.2*   Basic Metabolic Panel: Recent Labs  Lab 01/23/18 1409 01/24/18 0134 01/26/18 0508 01/27/18 0529  NA 140 140 139 142  K 4.4 4.1 3.8 3.9  CL 109 105 101 102  CO2 26 24 27 30   GLUCOSE 90 109* 107* 111*  BUN 20 21 26* 23  CREATININE 1.16 1.26* 1.35* 1.29*  CALCIUM 8.7* 8.9 8.8* 9.0  MG  --   --  2.4 2.2   Liver Function Tests: Recent Labs  Lab 01/26/18 0508 01/27/18 0529  AST 14* 17  ALT 10 10  ALKPHOS 94 91  BILITOT 0.7 0.7  PROT 6.8 7.4  ALBUMIN 3.6 3.9   No results for input(s): LIPASE, AMYLASE in the last 168 hours. No results for input(s): AMMONIA in the last 168 hours. CBC: Recent Labs  Lab 01/23/18 1409  WBC 4.5  NEUTROABS 3.4  HGB 9.0*  HCT 32.6*  MCV 85.3  PLT 211   Cardiac Enzymes: Recent Labs  Lab 01/23/18 1948 01/24/18 0134 01/24/18 0550  TROPONINI <0.03 <0.03 <0.03  BNP: Invalid input(s): POCBNP CBG: Recent Labs  Lab  01/26/18 1216 01/26/18 1649 01/26/18 2034 01/27/18 0749 01/27/18 1144  GLUCAP 137* 153* 177* 116* 116*   D-Dimer No results for input(s): DDIMER in the last 72 hours. Hgb A1c No results for input(s): HGBA1C in the last 72 hours. Lipid Profile No results for input(s): CHOL, HDL, LDLCALC, TRIG, CHOLHDL, LDLDIRECT in the last 72 hours. Thyroid function studies No results for input(s): TSH, T4TOTAL, T3FREE, THYROIDAB in the last 72 hours.  Invalid input(s): FREET3 Anemia work up No results for input(s): VITAMINB12, FOLATE, FERRITIN, TIBC, IRON, RETICCTPCT in the last 72 hours. Urinalysis    Component Value Date/Time   COLORURINE YELLOW 01/23/2018 [place] 01/23/2018 1245   LABSPEC 1.020 01/23/2018 1245   PHURINE 5.0 01/23/2018 1245   GLUCOSEU NEGATIVE 01/23/2018 1245   HGBUR NEGATIVE 01/23/2018 1245   BILIRUBINUR NEGATIVE 01/23/2018 1245   KETONESUR NEGATIVE 01/23/2018 1245   PROTEINUR NEGATIVE 01/23/2018 1245   UROBILINOGEN 0.2 10/22/2014 2136   NITRITE NEGATIVE 01/23/2018 1245   LEUKOCYTESUR NEGATIVE 01/23/2018 1245   Sepsis Labs Invalid input(s): PROCALCITONIN,  WBC,  LACTICIDVEN Microbiology No results found for this or any previous visit (from the past 240 hour(s)).   Time coordinating discharge:  I have spent 35 minutes face to face with the patient and on the ward discussing the patients care, assessment, plan and disposition with other care givers. >50% of the time was devoted counseling the patient about the risks and benefits of treatment/Discharge disposition and coordinating care.   SIGNED:   Damita Lack, MD  [facility] 01/27/2018, 12:29 PM Pager   If 7PM-7AM, please contact night-coverage [email or web address] Password [email or web address]

## 2018-01-27 NOTE — Clinical Social Work Placement (Signed)
   11:48 AM Patient chose bed at [facility]   LCSW confirmed bed with facility.   LCSW faxed dc docs to facility.  Patient will transport by [facility].  RN report number: [phone]  BKJ  CLINICAL SOCIAL WORK PLACEMENT  NOTE  Date:  01/27/2018  Patient Details  Name: Joel [name] MRN: [id number] Date of Birth: 05-04-1932  Clinical Social Work is seeking post-discharge placement for this patient at the [place] level of care (*CSW will initial, date and re-position this form in  chart as items are completed):  Yes   Patient/family provided with [facility] Work Department's list of facilities offering this level of care within the geographic area requested by the patient (or if unable, by the patient's family).  Yes   Patient/family informed of their freedom to choose among providers that offer the needed level of care, that participate in Medicare, Medicaid or managed care program needed by the patient, have an available bed and are willing to accept the patient.  Yes   Patient/family informed of [facility] ownership interest in [facility] and [facility], as well as of the fact that they are under no obligation to receive care at these facilities.  PASRR submitted to EDS on       PASRR number received on 01/25/18     Existing PASRR number confirmed on       FL2 transmitted to all facilities in geographic area requested by pt/family on 01/25/18     FL2 transmitted to all facilities within larger geographic area on       Patient informed that his/her managed care company has contracts with or will negotiate with certain facilities, including the following:        Yes   Patient/family informed of bed offers received.  Patient chooses bed at [facility]     Physician recommends and patient chooses bed at      Patient to be transferred to [facility] on 01/27/18.  Patient to be transferred to  facility by EMS     Patient family notified on 01/27/18 of transfer.  Name of family member notified:  [name]     PHYSICIAN Please prepare priority discharge summary, including medications, Please prepare prescriptions     Additional Comment:    _______________________________________________ Servando Snare, LCSW 01/27/2018, 11:48 AM

## 2018-01-30 DIAGNOSIS — I4819 Other persistent atrial fibrillation: Secondary | ICD-10-CM | POA: Diagnosis not present

## 2018-01-30 DIAGNOSIS — I5033 Acute on chronic diastolic (congestive) heart failure: Secondary | ICD-10-CM | POA: Diagnosis not present

## 2018-01-30 DIAGNOSIS — E1140 Type 2 diabetes mellitus with diabetic neuropathy, unspecified: Secondary | ICD-10-CM | POA: Diagnosis not present

## 2018-01-30 DIAGNOSIS — J449 Chronic obstructive pulmonary disease, unspecified: Secondary | ICD-10-CM | POA: Diagnosis not present

## 2018-01-31 ENCOUNTER — Encounter: Payer: Self-pay | Admitting: *Deleted

## 2018-01-31 ENCOUNTER — Other Ambulatory Visit: Payer: Self-pay | Admitting: *Deleted

## 2018-01-31 NOTE — Patient Outreach (Signed)
[facility]) Care Management  01/31/2018  Joel [name] 06/27/1932 [id number]   CSW was able to make initial contact with patient today to perform the assessment, as well as assess and assist with social work needs and services, when Joel [name] met with patient at [facility] [place] where patient currently resides to receive short-term rehabilitative services.  CSW introduced self, explained role and types of services provided through [facility] ([facility]).  CSW further explained to patient that CSW works with patient's RNCM, also with [facility], Joel [name]. CSW then explained the reason for the visit, indicating that Joel [name] thought that patient would benefit from social work services and resources to assist with discharge planning needs and services from the skilled nursing facility.  CSW obtained two HIPAA compliant identifiers from patient, which included patient's name and date of birth.  Patient appeared to be in good spirits today, reporting that he had already worked with therapies this morning, both physical and occupational, "getting that out of the way".   Patient indicated that he is already ready to return home, even though he was just admitted to [facility] on Friday, January 27, 2018.  CSW explained to patient that he will probably need to reside at [facility] for the approved 20 days of skilled care, as patient is unable to ambulate without assistance or perform activities of daily living independently, but plans to return home to live alone.  Patient was not happy at the mention of this, but voiced understanding and was agreeable to this plan.  Patient would like for his home health services to be reinstated through [facility], at time of discharge from the facility.  CSW will follow-up with patient again on Wednesday, February 08, 2018 at 9:00 AM at [facility]  to assess and assist with discharge planning needs and services.  Joel [name], BSW, MSW, LCSW  Licensed Education officer, environmental Health System  Mailing [place] [address], [place], [place] [address] Physical Address-[address], [place], [place] [address] Toll Free Main # [phone] Fax # [phone] Cell # [phone]  Office # [phone] [name]

## 2018-02-01 DIAGNOSIS — L03119 Cellulitis of unspecified part of limb: Secondary | ICD-10-CM | POA: Diagnosis not present

## 2018-02-01 DIAGNOSIS — L89302 Pressure ulcer of unspecified buttock, stage 2: Secondary | ICD-10-CM | POA: Diagnosis not present

## 2018-02-01 DIAGNOSIS — C189 Malignant neoplasm of colon, unspecified: Secondary | ICD-10-CM | POA: Diagnosis not present

## 2018-02-01 DIAGNOSIS — I10 Essential (primary) hypertension: Secondary | ICD-10-CM | POA: Diagnosis not present

## 2018-02-01 DIAGNOSIS — G4733 Obstructive sleep apnea (adult) (pediatric): Secondary | ICD-10-CM | POA: Diagnosis not present

## 2018-02-01 DIAGNOSIS — E1142 Type 2 diabetes mellitus with diabetic polyneuropathy: Secondary | ICD-10-CM | POA: Diagnosis not present

## 2018-02-01 DIAGNOSIS — I509 Heart failure, unspecified: Secondary | ICD-10-CM | POA: Diagnosis not present

## 2018-02-01 DIAGNOSIS — E46 Unspecified protein-calorie malnutrition: Secondary | ICD-10-CM | POA: Diagnosis not present

## 2018-02-01 DIAGNOSIS — J449 Chronic obstructive pulmonary disease, unspecified: Secondary | ICD-10-CM | POA: Diagnosis not present

## 2018-02-02 ENCOUNTER — Ambulatory Visit: Payer: PPO | Admitting: *Deleted

## 2018-02-02 DIAGNOSIS — S91102A Unspecified open wound of left great toe without damage to nail, initial encounter: Secondary | ICD-10-CM | POA: Diagnosis not present

## 2018-02-02 DIAGNOSIS — L89153 Pressure ulcer of sacral region, stage 3: Secondary | ICD-10-CM | POA: Diagnosis not present

## 2018-02-02 DIAGNOSIS — S91101A Unspecified open wound of right great toe without damage to nail, initial encounter: Secondary | ICD-10-CM | POA: Diagnosis not present

## 2018-02-07 DIAGNOSIS — I5033 Acute on chronic diastolic (congestive) heart failure: Secondary | ICD-10-CM | POA: Diagnosis not present

## 2018-02-07 DIAGNOSIS — J449 Chronic obstructive pulmonary disease, unspecified: Secondary | ICD-10-CM | POA: Diagnosis not present

## 2018-02-07 DIAGNOSIS — E1140 Type 2 diabetes mellitus with diabetic neuropathy, unspecified: Secondary | ICD-10-CM | POA: Diagnosis not present

## 2018-02-07 DIAGNOSIS — L89323 Pressure ulcer of left buttock, stage 3: Secondary | ICD-10-CM | POA: Diagnosis not present

## 2018-02-08 ENCOUNTER — Other Ambulatory Visit: Payer: Self-pay | Admitting: *Deleted

## 2018-02-08 ENCOUNTER — Encounter: Payer: Self-pay | Admitting: *Deleted

## 2018-02-08 DIAGNOSIS — S91101D Unspecified open wound of right great toe without damage to nail, subsequent encounter: Secondary | ICD-10-CM | POA: Diagnosis not present

## 2018-02-08 DIAGNOSIS — L89153 Pressure ulcer of sacral region, stage 3: Secondary | ICD-10-CM | POA: Diagnosis not present

## 2018-02-08 DIAGNOSIS — S91102D Unspecified open wound of left great toe without damage to nail, subsequent encounter: Secondary | ICD-10-CM | POA: Diagnosis not present

## 2018-02-08 NOTE — Patient Outreach (Signed)
[facility]) Care Management  02/08/2018  Joel [name] Mar 08, 1932 [id number]   CSW was able to meet with patient today at [name] [place] where patient currently resides to receive short-term rehabilitative services.  Patient informed CSW this morning that he has been approved for Long-Term Care Medicaid, through the [place], and that he plans to remain at [name] for long-term care services.  CSW was able to confirm with patient's attending nurse, as well as Santiago Glad, Social Worker/Admissions Coordinator at [name], that patient will finish with therapies (both physical and occupational) on Wednesday, February 15, 2018 and move into a Long-Term Care Medicaid bed that same day. CSW will perform a case closure on patient, as all goals of treatment have been met from social work standpoint and no additional social work needs have been identified at this time.  CSW will notify patient's RNCM with [name], [name] of CSW's plans to close patient's case.  CSW will fax an update to patient's Primary Care Physician, [name] to ensure that they are aware of CSW's involvement with patient's plan of care.   Nat Christen, BSW, MSW, LCSW  Licensed Education officer, environmental Health System  Mailing [place] [address] [place], [place] [address] Physical Address-[address], [place], [place] [address] Toll Free Main # [phone] Fax # [phone] Cell # [phone]  Office # [phone] [email or web address]

## 2018-02-08 NOTE — Patient Outreach (Signed)
[facility]) Care Management  02/08/2018  Joel [name] 1932/11/18 [id number]   Member currently admitted to inpatient facility for greater than 10 days.  Will close per workflow.  Also noted per CSW, member will remain in facility for long term care.   [name], [place], MSN [place] [phone]

## 2018-02-10 ENCOUNTER — Ambulatory Visit: Payer: Self-pay

## 2018-02-14 DIAGNOSIS — J449 Chronic obstructive pulmonary disease, unspecified: Secondary | ICD-10-CM | POA: Diagnosis not present

## 2018-02-14 DIAGNOSIS — I5033 Acute on chronic diastolic (congestive) heart failure: Secondary | ICD-10-CM | POA: Diagnosis not present

## 2018-02-14 DIAGNOSIS — I4819 Other persistent atrial fibrillation: Secondary | ICD-10-CM | POA: Diagnosis not present

## 2018-02-14 DIAGNOSIS — E1140 Type 2 diabetes mellitus with diabetic neuropathy, unspecified: Secondary | ICD-10-CM | POA: Diagnosis not present

## 2018-02-15 DIAGNOSIS — L89329 Pressure ulcer of left buttock, unspecified stage: Secondary | ICD-10-CM | POA: Diagnosis not present

## 2018-02-15 DIAGNOSIS — F319 Bipolar disorder, unspecified: Secondary | ICD-10-CM | POA: Diagnosis not present

## 2018-02-15 DIAGNOSIS — E11621 Type 2 diabetes mellitus with foot ulcer: Secondary | ICD-10-CM | POA: Diagnosis not present

## 2018-02-15 DIAGNOSIS — I5033 Acute on chronic diastolic (congestive) heart failure: Secondary | ICD-10-CM | POA: Diagnosis not present

## 2018-02-15 DIAGNOSIS — I4891 Unspecified atrial fibrillation: Secondary | ICD-10-CM | POA: Diagnosis not present

## 2018-02-15 DIAGNOSIS — J9611 Chronic respiratory failure with hypoxia: Secondary | ICD-10-CM | POA: Diagnosis not present

## 2018-02-15 DIAGNOSIS — E1169 Type 2 diabetes mellitus with other specified complication: Secondary | ICD-10-CM | POA: Diagnosis not present

## 2018-02-15 DIAGNOSIS — G4733 Obstructive sleep apnea (adult) (pediatric): Secondary | ICD-10-CM | POA: Diagnosis not present

## 2018-02-15 DIAGNOSIS — R2689 Other abnormalities of gait and mobility: Secondary | ICD-10-CM | POA: Diagnosis not present

## 2018-02-15 DIAGNOSIS — J449 Chronic obstructive pulmonary disease, unspecified: Secondary | ICD-10-CM | POA: Diagnosis not present

## 2018-02-15 DIAGNOSIS — R278 Other lack of coordination: Secondary | ICD-10-CM | POA: Diagnosis not present

## 2018-02-15 DIAGNOSIS — E785 Hyperlipidemia, unspecified: Secondary | ICD-10-CM | POA: Diagnosis not present

## 2018-02-15 DIAGNOSIS — S91101D Unspecified open wound of right great toe without damage to nail, subsequent encounter: Secondary | ICD-10-CM | POA: Diagnosis not present

## 2018-02-15 DIAGNOSIS — L03116 Cellulitis of left lower limb: Secondary | ICD-10-CM | POA: Diagnosis not present

## 2018-02-15 DIAGNOSIS — L89153 Pressure ulcer of sacral region, stage 3: Secondary | ICD-10-CM | POA: Diagnosis not present

## 2018-02-15 DIAGNOSIS — S91102D Unspecified open wound of left great toe without damage to nail, subsequent encounter: Secondary | ICD-10-CM | POA: Diagnosis not present

## 2018-02-17 ENCOUNTER — Encounter (HOSPITAL_BASED_OUTPATIENT_CLINIC_OR_DEPARTMENT_OTHER): Payer: PPO | Attending: Internal Medicine

## 2018-02-20 DIAGNOSIS — I872 Venous insufficiency (chronic) (peripheral): Secondary | ICD-10-CM | POA: Diagnosis not present

## 2018-02-20 DIAGNOSIS — I5033 Acute on chronic diastolic (congestive) heart failure: Secondary | ICD-10-CM | POA: Diagnosis not present

## 2018-02-20 DIAGNOSIS — J449 Chronic obstructive pulmonary disease, unspecified: Secondary | ICD-10-CM | POA: Diagnosis not present

## 2018-02-20 DIAGNOSIS — L89323 Pressure ulcer of left buttock, stage 3: Secondary | ICD-10-CM | POA: Diagnosis not present

## 2018-02-22 DIAGNOSIS — L89153 Pressure ulcer of sacral region, stage 3: Secondary | ICD-10-CM | POA: Diagnosis not present

## 2018-02-22 DIAGNOSIS — S91101D Unspecified open wound of right great toe without damage to nail, subsequent encounter: Secondary | ICD-10-CM | POA: Diagnosis not present

## 2018-02-22 DIAGNOSIS — R05 Cough: Secondary | ICD-10-CM | POA: Diagnosis not present

## 2018-02-22 DIAGNOSIS — S91102D Unspecified open wound of left great toe without damage to nail, subsequent encounter: Secondary | ICD-10-CM | POA: Diagnosis not present

## 2018-02-23 DIAGNOSIS — D649 Anemia, unspecified: Secondary | ICD-10-CM | POA: Diagnosis not present

## 2018-02-23 DIAGNOSIS — I4891 Unspecified atrial fibrillation: Secondary | ICD-10-CM | POA: Diagnosis not present

## 2018-02-23 DIAGNOSIS — M109 Gout, unspecified: Secondary | ICD-10-CM | POA: Diagnosis not present

## 2018-02-23 DIAGNOSIS — I509 Heart failure, unspecified: Secondary | ICD-10-CM | POA: Diagnosis not present

## 2018-02-23 DIAGNOSIS — I10 Essential (primary) hypertension: Secondary | ICD-10-CM | POA: Diagnosis not present

## 2018-02-23 DIAGNOSIS — E1122 Type 2 diabetes mellitus with diabetic chronic kidney disease: Secondary | ICD-10-CM | POA: Diagnosis not present

## 2018-02-23 DIAGNOSIS — J9611 Chronic respiratory failure with hypoxia: Secondary | ICD-10-CM | POA: Diagnosis not present

## 2018-02-23 DIAGNOSIS — J449 Chronic obstructive pulmonary disease, unspecified: Secondary | ICD-10-CM | POA: Diagnosis not present

## 2018-02-23 DIAGNOSIS — G4733 Obstructive sleep apnea (adult) (pediatric): Secondary | ICD-10-CM | POA: Diagnosis not present

## 2018-02-25 DIAGNOSIS — Z9181 History of falling: Secondary | ICD-10-CM | POA: Diagnosis not present

## 2018-02-25 DIAGNOSIS — J410 Simple chronic bronchitis: Secondary | ICD-10-CM | POA: Diagnosis not present

## 2018-02-25 DIAGNOSIS — E662 Morbid (severe) obesity with alveolar hypoventilation: Secondary | ICD-10-CM | POA: Diagnosis not present

## 2018-02-25 DIAGNOSIS — J449 Chronic obstructive pulmonary disease, unspecified: Secondary | ICD-10-CM | POA: Diagnosis not present

## 2018-02-25 DIAGNOSIS — M6281 Muscle weakness (generalized): Secondary | ICD-10-CM | POA: Diagnosis not present

## 2018-02-25 DIAGNOSIS — C189 Malignant neoplasm of colon, unspecified: Secondary | ICD-10-CM | POA: Diagnosis not present

## 2018-02-27 DIAGNOSIS — I5033 Acute on chronic diastolic (congestive) heart failure: Secondary | ICD-10-CM | POA: Diagnosis not present

## 2018-02-27 DIAGNOSIS — I872 Venous insufficiency (chronic) (peripheral): Secondary | ICD-10-CM | POA: Diagnosis not present

## 2018-02-27 DIAGNOSIS — R040 Epistaxis: Secondary | ICD-10-CM | POA: Diagnosis not present

## 2018-02-27 DIAGNOSIS — I4819 Other persistent atrial fibrillation: Secondary | ICD-10-CM | POA: Diagnosis not present

## 2018-03-01 DIAGNOSIS — S91102D Unspecified open wound of left great toe without damage to nail, subsequent encounter: Secondary | ICD-10-CM | POA: Diagnosis not present

## 2018-03-01 DIAGNOSIS — I872 Venous insufficiency (chronic) (peripheral): Secondary | ICD-10-CM | POA: Diagnosis not present

## 2018-03-01 DIAGNOSIS — R040 Epistaxis: Secondary | ICD-10-CM | POA: Diagnosis not present

## 2018-03-01 DIAGNOSIS — I4819 Other persistent atrial fibrillation: Secondary | ICD-10-CM | POA: Diagnosis not present

## 2018-03-01 DIAGNOSIS — S91101D Unspecified open wound of right great toe without damage to nail, subsequent encounter: Secondary | ICD-10-CM | POA: Diagnosis not present

## 2018-03-01 DIAGNOSIS — L89153 Pressure ulcer of sacral region, stage 3: Secondary | ICD-10-CM | POA: Diagnosis not present

## 2018-03-01 DIAGNOSIS — I5033 Acute on chronic diastolic (congestive) heart failure: Secondary | ICD-10-CM | POA: Diagnosis not present

## 2018-03-06 DIAGNOSIS — J9611 Chronic respiratory failure with hypoxia: Secondary | ICD-10-CM | POA: Diagnosis not present

## 2018-03-06 DIAGNOSIS — E785 Hyperlipidemia, unspecified: Secondary | ICD-10-CM | POA: Diagnosis not present

## 2018-03-06 DIAGNOSIS — I5033 Acute on chronic diastolic (congestive) heart failure: Secondary | ICD-10-CM | POA: Diagnosis not present

## 2018-03-06 DIAGNOSIS — R2689 Other abnormalities of gait and mobility: Secondary | ICD-10-CM | POA: Diagnosis not present

## 2018-03-06 DIAGNOSIS — E1169 Type 2 diabetes mellitus with other specified complication: Secondary | ICD-10-CM | POA: Diagnosis not present

## 2018-03-06 DIAGNOSIS — G4733 Obstructive sleep apnea (adult) (pediatric): Secondary | ICD-10-CM | POA: Diagnosis not present

## 2018-03-06 DIAGNOSIS — I4891 Unspecified atrial fibrillation: Secondary | ICD-10-CM | POA: Diagnosis not present

## 2018-03-06 DIAGNOSIS — L03116 Cellulitis of left lower limb: Secondary | ICD-10-CM | POA: Diagnosis not present

## 2018-03-06 DIAGNOSIS — R278 Other lack of coordination: Secondary | ICD-10-CM | POA: Diagnosis not present

## 2018-03-06 DIAGNOSIS — E11621 Type 2 diabetes mellitus with foot ulcer: Secondary | ICD-10-CM | POA: Diagnosis not present

## 2018-03-06 DIAGNOSIS — L89329 Pressure ulcer of left buttock, unspecified stage: Secondary | ICD-10-CM | POA: Diagnosis not present

## 2018-03-06 DIAGNOSIS — J449 Chronic obstructive pulmonary disease, unspecified: Secondary | ICD-10-CM | POA: Diagnosis not present

## 2018-03-06 DIAGNOSIS — F319 Bipolar disorder, unspecified: Secondary | ICD-10-CM | POA: Diagnosis not present

## 2018-03-10 ENCOUNTER — Ambulatory Visit (INDEPENDENT_AMBULATORY_CARE_PROVIDER_SITE_OTHER): Payer: PPO | Admitting: Orthopaedic Surgery

## 2018-03-10 ENCOUNTER — Encounter (INDEPENDENT_AMBULATORY_CARE_PROVIDER_SITE_OTHER): Payer: Self-pay | Admitting: Orthopaedic Surgery

## 2018-03-10 VITALS — BP 123/73 | HR 75 | Ht 68.0 in | Wt 238.0 lb

## 2018-03-10 DIAGNOSIS — M1711 Unilateral primary osteoarthritis, right knee: Secondary | ICD-10-CM

## 2018-03-10 DIAGNOSIS — M1712 Unilateral primary osteoarthritis, left knee: Secondary | ICD-10-CM

## 2018-03-10 DIAGNOSIS — M170 Bilateral primary osteoarthritis of knee: Secondary | ICD-10-CM

## 2018-03-10 MED ORDER — LIDOCAINE HCL 1 % IJ SOLN
0.5000 mL | INTRAMUSCULAR | Status: AC | PRN
  Administered 2018-03-10: .5 mL

## 2018-03-10 MED ORDER — BUPIVACAINE HCL 0.25 % IJ SOLN
4.0000 mL | INTRAMUSCULAR | Status: AC | PRN
  Administered 2018-03-10: 4 mL via INTRA_ARTICULAR

## 2018-03-10 MED ORDER — METHYLPREDNISOLONE ACETATE 40 MG/ML IJ SUSP
40.0000 mg | INTRAMUSCULAR | Status: AC | PRN
  Administered 2018-03-10: 40 mg via INTRA_ARTICULAR

## 2018-03-10 MED ORDER — BUPIVACAINE HCL 0.5 % IJ SOLN
3.0000 mL | INTRAMUSCULAR | Status: AC | PRN
  Administered 2018-03-10: 3 mL via INTRA_ARTICULAR

## 2018-03-10 NOTE — Progress Notes (Signed)
Office Visit Note   Patient: Joel [name]           Date of Birth: Feb 23, 1932           MRN: [id number] Visit Date: 03/10/2018              Requested by: Wenda Low, MD [address] [place], [place] [address] PCP: Wenda Low, MD   Assessment & Plan: Visit Diagnoses:  1. Unilateral primary osteoarthritis, right knee   2. Unilateral primary osteoarthritis, left knee     Plan: Injections performed both knees so we can proceed with this therapy at the nursing home.  Follow-up PRN.  Follow-Up Instructions: No follow-ups on file.   Orders:  Orders Placed This Encounter  Procedures  . Large Joint Inj: R knee  . Large Joint Inj: L knee   No orders of the defined types were placed in this encounter.     Procedures: Large Joint Inj: R knee on 03/10/2018 3:19 PM Indications: pain and joint swelling Details: 22 G 1.5 in needle, anterolateral approach  Arthrogram: No  Medications: 40 mg methylPREDNISolone acetate 40 MG/ML; 0.5 mL lidocaine 1 %; 4 mL bupivacaine 0.25 % Outcome: tolerated well, no immediate complications Procedure, treatment alternatives, risks and benefits explained, specific risks discussed. Consent was given by the patient. Immediately prior to procedure a time out was called to verify the correct patient, procedure, equipment, support staff and site/side marked as required. Patient was prepped and draped in the usual sterile fashion.   Large Joint Inj: L knee on 03/10/2018 3:20 PM Indications: joint swelling and pain Details: 22 G 1.5 in needle, anterolateral approach  Arthrogram: No  Medications: 3 mL bupivacaine 0.5 %; 0.5 mL lidocaine 1 %; 40 mg methylPREDNISolone acetate 40 MG/ML Outcome: tolerated well, no immediate complications Procedure, treatment alternatives, risks and benefits explained, specific risks discussed. Consent was given by the patient. Immediately prior to procedure a time out was called to verify the correct patient,  procedure, equipment, support staff and site/side marked as required. Patient was prepped and draped in the usual sterile fashion.       Clinical Data: No additional findings.   Subjective: Chief Complaint  Patient presents with  . Right Hip - Follow-up  . Right Knee - Follow-up    HPI patient returns post right femoral pinning 1 year ago.  After his rehab he was more mobile and then was in the hospital with multiple medical problems including laparoscopic right colon resection for CA with problems with GI bleeding.  Problems of COPD heart problems.  He is in long teds has problems with right knee flexion contracture he is working on trying to stretch it and try to walk with therapy at [facility] where he staying.  He is making some progress with stretching working 20 minutes a day.  He is asking about knee replacement he is on double fluid pills.  Patient not on insulin.  Review of Systems he is systems unchanged from last year when I pinned his hip fracture other than as mentioned above with colon cancer surgery followed by permanently standing [facility] after he went through rehab.   Objective: Vital Signs: BP 123/73   Pulse 75   Ht 5\' 8"  (1.727 m)   Wt 238 lb (108 kg)   BMI 36.19 kg/m   Physical Exam Constitutional:      Appearance: He is well-developed.  HENT:     Head: Normocephalic and atraumatic.  Eyes:  Pupils: Pupils are equal, round, and reactive to light.  Neck:     Thyroid: No thyromegaly.     Trachea: No tracheal deviation.  Cardiovascular:     Rate and Rhythm: Normal rate.  Pulmonary:     Breath sounds: No wheezing.     Comments: Mild dyspnea with mild prolonged expiratory phase. Abdominal:     General: Bowel sounds are normal.     Palpations: Abdomen is soft.  Skin:    General: Skin is warm and dry.     Capillary Refill: Capillary refill takes less than 2 seconds.  Neurological:     Mental Status: He is alert and oriented to person, place,  and time.  Psychiatric:        Behavior: Behavior normal.        Thought Content: Thought content normal.        Judgment: Judgment normal.     Ortho Exam 45 to 9 degrees range of motion right knee.  Left knee comes within 10 degrees reaching full extension.  Crepitus with both knees range of motion mild varus deformity bilaterally.  Specialty Comments:  No specialty comments available.  Imaging: No results found.   PMFS History: Patient Active Problem List   Diagnosis Date Noted  . CHF exacerbation ([place]) 01/23/2018  . Pain in right hip 12/06/2017  . Unilateral primary osteoarthritis, right knee 12/06/2017  . Normal coronary arteries 09/27/2017  . Pressure injury of skin 09/07/2017  . S/P colon resection 09/05/2017  . Cancer of ascending colon ([place]) 09/05/2017  . Dyspnea 09/01/2017  . Blood loss anemia 08/30/2017  . Closed displaced fracture of right femoral neck ([place]) 03/22/2017  . Cough 01/17/2017  . Persistent atrial fibrillation 01/17/2017  . Leg swelling 09/26/2016  . GI bleed 05/15/2016  . UGI bleed 05/14/2016  . COPD (chronic obstructive pulmonary disease) ([place]) 05/14/2016  . Chronic GI bleeding 04/12/2016  . HLD (hyperlipidemia) 04/12/2016  . Anemia due to chronic blood loss 04/12/2016  . Diabetes mellitus with complication ([place])   . BPPV (benign paroxysmal positional vertigo) 01/27/2016  . Hypertension 01/27/2016  . Malignant neoplasm of prostate ([place]) 01/07/2016  . Bipolar I disorder ([place])   . Bright red rectal bleeding 12/16/2015  . Rectal bleeding 12/16/2015  . Pulmonary hypertension ([name]) 06/29/2015  . Moderate aortic stenosis 04/17/2013  . Encounter for monitoring Coumadin therapy 04/17/2013  . Anemia, iron deficiency 04/04/2013  . Dyspnea on exertion 11/18/2012  . Obesity hypoventilation syndrome ([place]) 09/19/2012  . ALLERGIC RHINITIS 06/24/2009  . DM2 (diabetes mellitus, type 2) ([place]) 03/13/2007  . OSA (obstructive sleep apnea) 03/13/2007  . COPD  mixed type ([place]) 03/13/2007   Past Medical History:  Diagnosis Date  . Allergic rhinitis   . Anemia   . Anxiety   . Aortic stenosis 2012   mild to mod   . Atrial fibrillation ([place]) 1991   with multiple DCCV  . Bipolar affective disorder ([place])   . Cancer of ascending colon ([place]) 09/05/2017  . CHF (congestive heart failure) ([place]) 08/30/2017  . COPD (chronic obstructive pulmonary disease) ([place])   . Diabetic neuropathy ([place])    in feet  . Diabetic neuropathy ([place])   . Dyslipidemia   . Fatigue    last year or so  . Gout   . Hypertension   . Insomnia   . Obesity   . On home oxygen therapy 2 1/2 liters with bipap at night  . OSA (obstructive sleep apnea)    severe, uses  bipap 16 1/2 by 12 or 13 setting  . Prostate cancer ([place])   . Rectal bleeding 12/16/2015  . Type 2 diabetes mellitus ([place])   . Vertigo     Family History  Problem Relation Age of Onset  . Tuberculosis Maternal Grandmother   . Heart attack Neg Hx   . Diabetes Neg Hx   . CAD Neg Hx   . Cancer Neg Hx     Past Surgical History:  Procedure Laterality Date  . BIOPSY  09/02/2017   Procedure: BIOPSY;  Surgeon: Ronald Lobo, MD;  Location: [facility];  Service: Endoscopy;;  . CARDIOVERSION  yrs ago   prior to 1998  . COLON RESECTION N/A 09/05/2017   Procedure: LAPAROSCOPIC RIGHT COLON RESECTION;  Surgeon: Fanny Skates, MD;  Location: [facility];  Service: General;  Laterality: N/A;  . COLONOSCOPY N/A 09/02/2017   Procedure: COLONOSCOPY;  Surgeon: Ronald Lobo, MD;  [facility]: [facility];  Service: Endoscopy;  Laterality: N/A;  . COLONOSCOPY WITH PROPOFOL N/A 01/23/2013   Procedure: COLONOSCOPY WITH PROPOFOL;  Surgeon: Garlan Fair, MD;  [facility]: [facility];  Service: Endoscopy;  Laterality: N/A;  . electro shock  1969   for depression  . ENTEROSCOPY Left 05/17/2016   Procedure: ENTEROSCOPY;  Surgeon: Wilford Corner, MD;  [facility];  Service: Endoscopy;  Laterality: Left;  .  ESOPHAGOGASTRODUODENOSCOPY N/A 03/03/2016   Procedure: ESOPHAGOGASTRODUODENOSCOPY (EGD);  Surgeon: Teena Irani, MD;  Location: [facility];  Service: Endoscopy;  Laterality: N/A;  . ESOPHAGOGASTRODUODENOSCOPY N/A 03/26/2016   Procedure: ESOPHAGOGASTRODUODENOSCOPY (EGD);  Surgeon: Teena Irani, MD;  Location: [facility];  Service: Endoscopy;  Laterality: N/A;  would like to use ultraslim scope  . ESOPHAGOGASTRODUODENOSCOPY N/A 08/31/2017   Procedure: ESOPHAGOGASTRODUODENOSCOPY (EGD);  Surgeon: Laurence Spates, MD;  Location: [facility];  Service: Endoscopy;  Laterality: N/A;  . ESOPHAGOGASTRODUODENOSCOPY (EGD) WITH PROPOFOL N/A 01/23/2013   Procedure: ESOPHAGOGASTRODUODENOSCOPY (EGD) WITH PROPOFOL;  Surgeon: Garlan Fair, MD;  [facility];  Service: Endoscopy;  Laterality: N/A;  . ESOPHAGOGASTRODUODENOSCOPY (EGD) WITH PROPOFOL N/A 04/13/2016   Procedure: ESOPHAGOGASTRODUODENOSCOPY (EGD) WITH PROPOFOL;  Surgeon: Otis Brace, MD;  Location: [facility];  Service: Gastroenterology;  Laterality: N/A;  . GIVENS CAPSULE STUDY N/A 03/03/2016   Procedure: GIVENS CAPSULE STUDY;  Surgeon: Teena Irani, MD;  [facility];  Service: Endoscopy;  Laterality: N/A;  . HEMORRHOID SURGERY N/A 12/16/2015   Procedure: PROCTOSCOPY WITH CONTROL OF BLEEDING;  Surgeon: Fanny Skates, MD;  Location: [facility];  Service: General;  Laterality: N/A;  . HIP PINNING,CANNULATED Right 03/23/2017   Procedure: CANNULATED HIP PINNING;  Surgeon: Marybelle Killings, MD;  [facility];  Service: Orthopedics;  Laterality: Right;  . KNEE SURGERY  1970's   rt  . LAPAROTOMY N/A 09/10/2017   Procedure: EXPLORATORY LAPAROTOMY, PARTIAL COLECTOMY;  Surgeon: Excell Seltzer, MD;  [facility];  Service: General;  Laterality: N/A;  . [place]  . POLYPECTOMY  09/02/2017   Procedure: POLYPECTOMY;  Surgeon: Ronald Lobo, MD;  [facility];  Service: Endoscopy;;  . PROSTATE BIOPSY    . SHOULDER SURGERY   7-8 yrs ago   rt   Social History   Occupational History  . Occupation: Surveyor  Tobacco Use  . Smoking status: Former Smoker    Packs/day: 2.00    Years: 25.00    Pack years: 50.00    Types: Cigarettes    Last attempt to quit: 07/08/1976    Years since quitting: 41.6  . Smokeless tobacco:  Never Used  Substance and Sexual Activity  . Alcohol use: No    Comment: in AA since 1977, no alcohol since 1977  . Drug use: No    Types: Marijuana    Comment: marijuana use 37 years ago  . Sexual activity: Yes

## 2018-03-14 ENCOUNTER — Inpatient Hospital Stay: Payer: PPO

## 2018-03-14 ENCOUNTER — Inpatient Hospital Stay: Payer: PPO | Attending: Oncology | Admitting: Oncology

## 2018-03-14 DIAGNOSIS — R05 Cough: Secondary | ICD-10-CM | POA: Diagnosis not present

## 2018-03-14 DIAGNOSIS — I4819 Other persistent atrial fibrillation: Secondary | ICD-10-CM | POA: Diagnosis not present

## 2018-03-14 DIAGNOSIS — E1140 Type 2 diabetes mellitus with diabetic neuropathy, unspecified: Secondary | ICD-10-CM | POA: Diagnosis not present

## 2018-03-14 DIAGNOSIS — I5032 Chronic diastolic (congestive) heart failure: Secondary | ICD-10-CM | POA: Diagnosis not present

## 2018-03-15 DIAGNOSIS — J189 Pneumonia, unspecified organism: Secondary | ICD-10-CM | POA: Diagnosis not present

## 2018-03-15 DIAGNOSIS — I4819 Other persistent atrial fibrillation: Secondary | ICD-10-CM | POA: Diagnosis not present

## 2018-03-15 DIAGNOSIS — I5032 Chronic diastolic (congestive) heart failure: Secondary | ICD-10-CM | POA: Diagnosis not present

## 2018-03-15 DIAGNOSIS — J449 Chronic obstructive pulmonary disease, unspecified: Secondary | ICD-10-CM | POA: Diagnosis not present

## 2018-03-17 DIAGNOSIS — F0390 Unspecified dementia without behavioral disturbance: Secondary | ICD-10-CM | POA: Diagnosis not present

## 2018-03-17 DIAGNOSIS — I4891 Unspecified atrial fibrillation: Secondary | ICD-10-CM | POA: Diagnosis not present

## 2018-03-17 DIAGNOSIS — M1990 Unspecified osteoarthritis, unspecified site: Secondary | ICD-10-CM | POA: Diagnosis not present

## 2018-03-17 DIAGNOSIS — R600 Localized edema: Secondary | ICD-10-CM | POA: Diagnosis not present

## 2018-03-17 DIAGNOSIS — E119 Type 2 diabetes mellitus without complications: Secondary | ICD-10-CM | POA: Diagnosis not present

## 2018-03-17 DIAGNOSIS — F319 Bipolar disorder, unspecified: Secondary | ICD-10-CM | POA: Diagnosis not present

## 2018-03-17 DIAGNOSIS — J449 Chronic obstructive pulmonary disease, unspecified: Secondary | ICD-10-CM | POA: Diagnosis not present

## 2018-03-17 DIAGNOSIS — R601 Generalized edema: Secondary | ICD-10-CM | POA: Diagnosis not present

## 2018-03-21 DIAGNOSIS — I5032 Chronic diastolic (congestive) heart failure: Secondary | ICD-10-CM | POA: Diagnosis not present

## 2018-03-21 DIAGNOSIS — I872 Venous insufficiency (chronic) (peripheral): Secondary | ICD-10-CM | POA: Diagnosis not present

## 2018-03-21 DIAGNOSIS — J189 Pneumonia, unspecified organism: Secondary | ICD-10-CM | POA: Diagnosis not present

## 2018-03-21 DIAGNOSIS — Z79899 Other long term (current) drug therapy: Secondary | ICD-10-CM | POA: Diagnosis not present

## 2018-03-21 DIAGNOSIS — R0602 Shortness of breath: Secondary | ICD-10-CM | POA: Diagnosis not present

## 2018-03-21 DIAGNOSIS — J449 Chronic obstructive pulmonary disease, unspecified: Secondary | ICD-10-CM | POA: Diagnosis not present

## 2018-03-21 DIAGNOSIS — N390 Urinary tract infection, site not specified: Secondary | ICD-10-CM | POA: Diagnosis not present

## 2018-03-21 DIAGNOSIS — D649 Anemia, unspecified: Secondary | ICD-10-CM | POA: Diagnosis not present

## 2018-03-22 DIAGNOSIS — J449 Chronic obstructive pulmonary disease, unspecified: Secondary | ICD-10-CM | POA: Diagnosis not present

## 2018-03-22 DIAGNOSIS — I4819 Other persistent atrial fibrillation: Secondary | ICD-10-CM | POA: Diagnosis not present

## 2018-03-22 DIAGNOSIS — J189 Pneumonia, unspecified organism: Secondary | ICD-10-CM | POA: Diagnosis not present

## 2018-03-22 DIAGNOSIS — I5032 Chronic diastolic (congestive) heart failure: Secondary | ICD-10-CM | POA: Diagnosis not present

## 2018-03-28 DIAGNOSIS — M6281 Muscle weakness (generalized): Secondary | ICD-10-CM | POA: Diagnosis not present

## 2018-03-28 DIAGNOSIS — C189 Malignant neoplasm of colon, unspecified: Secondary | ICD-10-CM | POA: Diagnosis not present

## 2018-03-28 DIAGNOSIS — J410 Simple chronic bronchitis: Secondary | ICD-10-CM | POA: Diagnosis not present

## 2018-03-28 DIAGNOSIS — Z9181 History of falling: Secondary | ICD-10-CM | POA: Diagnosis not present

## 2018-03-28 DIAGNOSIS — E662 Morbid (severe) obesity with alveolar hypoventilation: Secondary | ICD-10-CM | POA: Diagnosis not present

## 2018-03-28 DIAGNOSIS — J449 Chronic obstructive pulmonary disease, unspecified: Secondary | ICD-10-CM | POA: Diagnosis not present

## 2018-04-07 DIAGNOSIS — G4733 Obstructive sleep apnea (adult) (pediatric): Secondary | ICD-10-CM | POA: Diagnosis not present

## 2018-04-07 DIAGNOSIS — D649 Anemia, unspecified: Secondary | ICD-10-CM | POA: Diagnosis not present

## 2018-04-07 DIAGNOSIS — R600 Localized edema: Secondary | ICD-10-CM | POA: Diagnosis not present

## 2018-04-07 DIAGNOSIS — R0602 Shortness of breath: Secondary | ICD-10-CM | POA: Diagnosis not present

## 2018-04-07 DIAGNOSIS — D500 Iron deficiency anemia secondary to blood loss (chronic): Secondary | ICD-10-CM | POA: Diagnosis not present

## 2018-04-07 DIAGNOSIS — I509 Heart failure, unspecified: Secondary | ICD-10-CM | POA: Diagnosis not present

## 2018-04-07 DIAGNOSIS — I5032 Chronic diastolic (congestive) heart failure: Secondary | ICD-10-CM | POA: Diagnosis not present

## 2018-04-08 DIAGNOSIS — D649 Anemia, unspecified: Secondary | ICD-10-CM | POA: Diagnosis not present

## 2018-04-08 DIAGNOSIS — R0602 Shortness of breath: Secondary | ICD-10-CM | POA: Diagnosis not present

## 2018-04-08 DIAGNOSIS — I4891 Unspecified atrial fibrillation: Secondary | ICD-10-CM | POA: Diagnosis not present

## 2018-04-10 DIAGNOSIS — I5033 Acute on chronic diastolic (congestive) heart failure: Secondary | ICD-10-CM | POA: Diagnosis not present

## 2018-04-10 DIAGNOSIS — E1140 Type 2 diabetes mellitus with diabetic neuropathy, unspecified: Secondary | ICD-10-CM | POA: Diagnosis not present

## 2018-04-10 DIAGNOSIS — I4819 Other persistent atrial fibrillation: Secondary | ICD-10-CM | POA: Diagnosis not present

## 2018-04-10 DIAGNOSIS — D649 Anemia, unspecified: Secondary | ICD-10-CM | POA: Diagnosis not present

## 2018-04-17 DIAGNOSIS — Z79899 Other long term (current) drug therapy: Secondary | ICD-10-CM | POA: Diagnosis not present

## 2018-04-17 DIAGNOSIS — E119 Type 2 diabetes mellitus without complications: Secondary | ICD-10-CM | POA: Diagnosis not present

## 2018-04-17 DIAGNOSIS — I4891 Unspecified atrial fibrillation: Secondary | ICD-10-CM | POA: Diagnosis not present

## 2018-04-17 DIAGNOSIS — R0602 Shortness of breath: Secondary | ICD-10-CM | POA: Diagnosis not present

## 2018-04-17 DIAGNOSIS — D649 Anemia, unspecified: Secondary | ICD-10-CM | POA: Diagnosis not present

## 2018-04-18 DIAGNOSIS — I4891 Unspecified atrial fibrillation: Secondary | ICD-10-CM | POA: Diagnosis not present

## 2018-04-18 DIAGNOSIS — D649 Anemia, unspecified: Secondary | ICD-10-CM | POA: Diagnosis not present

## 2018-04-21 DIAGNOSIS — K219 Gastro-esophageal reflux disease without esophagitis: Secondary | ICD-10-CM | POA: Diagnosis not present

## 2018-04-21 DIAGNOSIS — I5032 Chronic diastolic (congestive) heart failure: Secondary | ICD-10-CM | POA: Diagnosis not present

## 2018-04-21 DIAGNOSIS — I4819 Other persistent atrial fibrillation: Secondary | ICD-10-CM | POA: Diagnosis not present

## 2018-04-21 DIAGNOSIS — D649 Anemia, unspecified: Secondary | ICD-10-CM | POA: Diagnosis not present

## 2018-04-24 DIAGNOSIS — Z79899 Other long term (current) drug therapy: Secondary | ICD-10-CM | POA: Diagnosis not present

## 2018-04-24 DIAGNOSIS — E119 Type 2 diabetes mellitus without complications: Secondary | ICD-10-CM | POA: Diagnosis not present

## 2018-04-24 DIAGNOSIS — D649 Anemia, unspecified: Secondary | ICD-10-CM | POA: Diagnosis not present

## 2018-04-24 DIAGNOSIS — I4891 Unspecified atrial fibrillation: Secondary | ICD-10-CM | POA: Diagnosis not present

## 2018-04-26 DIAGNOSIS — M6281 Muscle weakness (generalized): Secondary | ICD-10-CM | POA: Diagnosis not present

## 2018-04-26 DIAGNOSIS — S72011D Unspecified intracapsular fracture of right femur, subsequent encounter for closed fracture with routine healing: Secondary | ICD-10-CM | POA: Diagnosis not present

## 2018-04-26 DIAGNOSIS — E662 Morbid (severe) obesity with alveolar hypoventilation: Secondary | ICD-10-CM | POA: Diagnosis not present

## 2018-04-26 DIAGNOSIS — J410 Simple chronic bronchitis: Secondary | ICD-10-CM | POA: Diagnosis not present

## 2018-05-03 DIAGNOSIS — J449 Chronic obstructive pulmonary disease, unspecified: Secondary | ICD-10-CM | POA: Diagnosis not present

## 2018-05-03 DIAGNOSIS — I5032 Chronic diastolic (congestive) heart failure: Secondary | ICD-10-CM | POA: Diagnosis not present

## 2018-05-03 DIAGNOSIS — R251 Tremor, unspecified: Secondary | ICD-10-CM | POA: Diagnosis not present

## 2018-05-03 DIAGNOSIS — I4819 Other persistent atrial fibrillation: Secondary | ICD-10-CM | POA: Diagnosis not present

## 2018-05-08 DIAGNOSIS — D649 Anemia, unspecified: Secondary | ICD-10-CM | POA: Diagnosis not present

## 2018-05-08 DIAGNOSIS — F331 Major depressive disorder, recurrent, moderate: Secondary | ICD-10-CM | POA: Diagnosis not present

## 2018-05-08 DIAGNOSIS — I5032 Chronic diastolic (congestive) heart failure: Secondary | ICD-10-CM | POA: Diagnosis not present

## 2018-05-08 DIAGNOSIS — F064 Anxiety disorder due to known physiological condition: Secondary | ICD-10-CM | POA: Diagnosis not present

## 2018-05-08 DIAGNOSIS — F5105 Insomnia due to other mental disorder: Secondary | ICD-10-CM | POA: Diagnosis not present

## 2018-05-08 DIAGNOSIS — J449 Chronic obstructive pulmonary disease, unspecified: Secondary | ICD-10-CM | POA: Diagnosis not present

## 2018-05-08 DIAGNOSIS — F0281 Dementia in other diseases classified elsewhere with behavioral disturbance: Secondary | ICD-10-CM | POA: Diagnosis not present

## 2018-05-08 DIAGNOSIS — I4819 Other persistent atrial fibrillation: Secondary | ICD-10-CM | POA: Diagnosis not present

## 2018-05-08 DIAGNOSIS — G301 Alzheimer's disease with late onset: Secondary | ICD-10-CM | POA: Diagnosis not present

## 2018-05-10 DIAGNOSIS — R0602 Shortness of breath: Secondary | ICD-10-CM | POA: Diagnosis not present

## 2018-05-10 DIAGNOSIS — I509 Heart failure, unspecified: Secondary | ICD-10-CM | POA: Diagnosis not present

## 2018-05-10 DIAGNOSIS — D649 Anemia, unspecified: Secondary | ICD-10-CM | POA: Diagnosis not present

## 2018-05-15 DIAGNOSIS — I4891 Unspecified atrial fibrillation: Secondary | ICD-10-CM | POA: Diagnosis not present

## 2018-05-15 DIAGNOSIS — D649 Anemia, unspecified: Secondary | ICD-10-CM | POA: Diagnosis not present

## 2018-05-15 DIAGNOSIS — E119 Type 2 diabetes mellitus without complications: Secondary | ICD-10-CM | POA: Diagnosis not present

## 2018-05-15 DIAGNOSIS — I509 Heart failure, unspecified: Secondary | ICD-10-CM | POA: Diagnosis not present

## 2018-05-27 DIAGNOSIS — M6281 Muscle weakness (generalized): Secondary | ICD-10-CM | POA: Diagnosis not present

## 2018-05-27 DIAGNOSIS — E662 Morbid (severe) obesity with alveolar hypoventilation: Secondary | ICD-10-CM | POA: Diagnosis not present

## 2018-05-27 DIAGNOSIS — S72011D Unspecified intracapsular fracture of right femur, subsequent encounter for closed fracture with routine healing: Secondary | ICD-10-CM | POA: Diagnosis not present

## 2018-05-27 DIAGNOSIS — J410 Simple chronic bronchitis: Secondary | ICD-10-CM | POA: Diagnosis not present

## 2018-05-30 DIAGNOSIS — I4891 Unspecified atrial fibrillation: Secondary | ICD-10-CM | POA: Diagnosis not present

## 2018-05-30 DIAGNOSIS — G4733 Obstructive sleep apnea (adult) (pediatric): Secondary | ICD-10-CM | POA: Diagnosis not present

## 2018-05-30 DIAGNOSIS — F319 Bipolar disorder, unspecified: Secondary | ICD-10-CM | POA: Diagnosis not present

## 2018-05-30 DIAGNOSIS — E785 Hyperlipidemia, unspecified: Secondary | ICD-10-CM | POA: Diagnosis not present

## 2018-05-30 DIAGNOSIS — J9611 Chronic respiratory failure with hypoxia: Secondary | ICD-10-CM | POA: Diagnosis not present

## 2018-05-30 DIAGNOSIS — E11621 Type 2 diabetes mellitus with foot ulcer: Secondary | ICD-10-CM | POA: Diagnosis not present

## 2018-05-30 DIAGNOSIS — L03116 Cellulitis of left lower limb: Secondary | ICD-10-CM | POA: Diagnosis not present

## 2018-05-30 DIAGNOSIS — J449 Chronic obstructive pulmonary disease, unspecified: Secondary | ICD-10-CM | POA: Diagnosis not present

## 2018-05-30 DIAGNOSIS — R278 Other lack of coordination: Secondary | ICD-10-CM | POA: Diagnosis not present

## 2018-05-30 DIAGNOSIS — R2689 Other abnormalities of gait and mobility: Secondary | ICD-10-CM | POA: Diagnosis not present

## 2018-05-30 DIAGNOSIS — I5033 Acute on chronic diastolic (congestive) heart failure: Secondary | ICD-10-CM | POA: Diagnosis not present

## 2018-05-30 DIAGNOSIS — E1169 Type 2 diabetes mellitus with other specified complication: Secondary | ICD-10-CM | POA: Diagnosis not present

## 2018-05-30 DIAGNOSIS — L89329 Pressure ulcer of left buttock, unspecified stage: Secondary | ICD-10-CM | POA: Diagnosis not present

## 2018-06-02 DIAGNOSIS — E1122 Type 2 diabetes mellitus with diabetic chronic kidney disease: Secondary | ICD-10-CM | POA: Diagnosis not present

## 2018-06-02 DIAGNOSIS — I10 Essential (primary) hypertension: Secondary | ICD-10-CM | POA: Diagnosis not present

## 2018-06-02 DIAGNOSIS — F322 Major depressive disorder, single episode, severe without psychotic features: Secondary | ICD-10-CM | POA: Diagnosis not present

## 2018-06-02 DIAGNOSIS — D500 Iron deficiency anemia secondary to blood loss (chronic): Secondary | ICD-10-CM | POA: Diagnosis not present

## 2018-06-02 DIAGNOSIS — R600 Localized edema: Secondary | ICD-10-CM | POA: Diagnosis not present

## 2018-06-02 DIAGNOSIS — I4891 Unspecified atrial fibrillation: Secondary | ICD-10-CM | POA: Diagnosis not present

## 2018-06-03 DIAGNOSIS — D649 Anemia, unspecified: Secondary | ICD-10-CM | POA: Diagnosis not present

## 2018-06-03 DIAGNOSIS — J449 Chronic obstructive pulmonary disease, unspecified: Secondary | ICD-10-CM | POA: Diagnosis not present

## 2018-06-03 DIAGNOSIS — I4891 Unspecified atrial fibrillation: Secondary | ICD-10-CM | POA: Diagnosis not present

## 2018-06-04 DIAGNOSIS — I5033 Acute on chronic diastolic (congestive) heart failure: Secondary | ICD-10-CM | POA: Diagnosis not present

## 2018-06-04 DIAGNOSIS — E1169 Type 2 diabetes mellitus with other specified complication: Secondary | ICD-10-CM | POA: Diagnosis not present

## 2018-06-04 DIAGNOSIS — J449 Chronic obstructive pulmonary disease, unspecified: Secondary | ICD-10-CM | POA: Diagnosis not present

## 2018-06-04 DIAGNOSIS — R2689 Other abnormalities of gait and mobility: Secondary | ICD-10-CM | POA: Diagnosis not present

## 2018-06-04 DIAGNOSIS — L03116 Cellulitis of left lower limb: Secondary | ICD-10-CM | POA: Diagnosis not present

## 2018-06-04 DIAGNOSIS — E785 Hyperlipidemia, unspecified: Secondary | ICD-10-CM | POA: Diagnosis not present

## 2018-06-04 DIAGNOSIS — G4733 Obstructive sleep apnea (adult) (pediatric): Secondary | ICD-10-CM | POA: Diagnosis not present

## 2018-06-04 DIAGNOSIS — E11621 Type 2 diabetes mellitus with foot ulcer: Secondary | ICD-10-CM | POA: Diagnosis not present

## 2018-06-04 DIAGNOSIS — I4891 Unspecified atrial fibrillation: Secondary | ICD-10-CM | POA: Diagnosis not present

## 2018-06-04 DIAGNOSIS — F319 Bipolar disorder, unspecified: Secondary | ICD-10-CM | POA: Diagnosis not present

## 2018-06-04 DIAGNOSIS — L89329 Pressure ulcer of left buttock, unspecified stage: Secondary | ICD-10-CM | POA: Diagnosis not present

## 2018-06-04 DIAGNOSIS — R278 Other lack of coordination: Secondary | ICD-10-CM | POA: Diagnosis not present

## 2018-06-04 DIAGNOSIS — J9611 Chronic respiratory failure with hypoxia: Secondary | ICD-10-CM | POA: Diagnosis not present

## 2018-06-05 DIAGNOSIS — D649 Anemia, unspecified: Secondary | ICD-10-CM | POA: Diagnosis not present

## 2018-06-05 DIAGNOSIS — I509 Heart failure, unspecified: Secondary | ICD-10-CM | POA: Diagnosis not present

## 2018-06-05 DIAGNOSIS — E119 Type 2 diabetes mellitus without complications: Secondary | ICD-10-CM | POA: Diagnosis not present

## 2018-06-05 DIAGNOSIS — I4891 Unspecified atrial fibrillation: Secondary | ICD-10-CM | POA: Diagnosis not present

## 2018-06-07 DIAGNOSIS — I5032 Chronic diastolic (congestive) heart failure: Secondary | ICD-10-CM | POA: Diagnosis not present

## 2018-06-07 DIAGNOSIS — J449 Chronic obstructive pulmonary disease, unspecified: Secondary | ICD-10-CM | POA: Diagnosis not present

## 2018-06-07 DIAGNOSIS — E876 Hypokalemia: Secondary | ICD-10-CM | POA: Diagnosis not present

## 2018-06-07 DIAGNOSIS — D649 Anemia, unspecified: Secondary | ICD-10-CM | POA: Diagnosis not present

## 2018-06-26 DIAGNOSIS — M6281 Muscle weakness (generalized): Secondary | ICD-10-CM | POA: Diagnosis not present

## 2018-06-26 DIAGNOSIS — J410 Simple chronic bronchitis: Secondary | ICD-10-CM | POA: Diagnosis not present

## 2018-06-26 DIAGNOSIS — E662 Morbid (severe) obesity with alveolar hypoventilation: Secondary | ICD-10-CM | POA: Diagnosis not present

## 2018-06-26 DIAGNOSIS — S72011D Unspecified intracapsular fracture of right femur, subsequent encounter for closed fracture with routine healing: Secondary | ICD-10-CM | POA: Diagnosis not present

## 2018-07-07 DIAGNOSIS — D649 Anemia, unspecified: Secondary | ICD-10-CM | POA: Diagnosis not present

## 2018-07-07 DIAGNOSIS — I5032 Chronic diastolic (congestive) heart failure: Secondary | ICD-10-CM | POA: Diagnosis not present

## 2018-07-07 DIAGNOSIS — E1140 Type 2 diabetes mellitus with diabetic neuropathy, unspecified: Secondary | ICD-10-CM | POA: Diagnosis not present

## 2018-07-07 DIAGNOSIS — W19XXXD Unspecified fall, subsequent encounter: Secondary | ICD-10-CM | POA: Diagnosis not present

## 2018-07-08 DIAGNOSIS — M79651 Pain in right thigh: Secondary | ICD-10-CM | POA: Diagnosis not present

## 2018-07-08 DIAGNOSIS — R079 Chest pain, unspecified: Secondary | ICD-10-CM | POA: Diagnosis not present

## 2018-07-08 DIAGNOSIS — M79661 Pain in right lower leg: Secondary | ICD-10-CM | POA: Diagnosis not present

## 2018-07-08 DIAGNOSIS — M25511 Pain in right shoulder: Secondary | ICD-10-CM | POA: Diagnosis not present

## 2018-07-14 DIAGNOSIS — F322 Major depressive disorder, single episode, severe without psychotic features: Secondary | ICD-10-CM | POA: Diagnosis not present

## 2018-07-14 DIAGNOSIS — E1122 Type 2 diabetes mellitus with diabetic chronic kidney disease: Secondary | ICD-10-CM | POA: Diagnosis not present

## 2018-07-14 DIAGNOSIS — R600 Localized edema: Secondary | ICD-10-CM | POA: Diagnosis not present

## 2018-07-14 DIAGNOSIS — D500 Iron deficiency anemia secondary to blood loss (chronic): Secondary | ICD-10-CM | POA: Diagnosis not present

## 2018-07-14 DIAGNOSIS — I4891 Unspecified atrial fibrillation: Secondary | ICD-10-CM | POA: Diagnosis not present

## 2018-07-14 DIAGNOSIS — I509 Heart failure, unspecified: Secondary | ICD-10-CM | POA: Diagnosis not present

## 2018-07-14 DIAGNOSIS — N183 Chronic kidney disease, stage 3 (moderate): Secondary | ICD-10-CM | POA: Diagnosis not present

## 2018-07-21 DIAGNOSIS — E119 Type 2 diabetes mellitus without complications: Secondary | ICD-10-CM | POA: Diagnosis not present

## 2018-07-27 DIAGNOSIS — M6281 Muscle weakness (generalized): Secondary | ICD-10-CM | POA: Diagnosis not present

## 2018-07-27 DIAGNOSIS — E662 Morbid (severe) obesity with alveolar hypoventilation: Secondary | ICD-10-CM | POA: Diagnosis not present

## 2018-07-27 DIAGNOSIS — S72011D Unspecified intracapsular fracture of right femur, subsequent encounter for closed fracture with routine healing: Secondary | ICD-10-CM | POA: Diagnosis not present

## 2018-07-27 DIAGNOSIS — J410 Simple chronic bronchitis: Secondary | ICD-10-CM | POA: Diagnosis not present

## 2018-07-28 DIAGNOSIS — L03115 Cellulitis of right lower limb: Secondary | ICD-10-CM | POA: Diagnosis not present

## 2018-07-28 DIAGNOSIS — I5032 Chronic diastolic (congestive) heart failure: Secondary | ICD-10-CM | POA: Diagnosis not present

## 2018-07-28 DIAGNOSIS — I4819 Other persistent atrial fibrillation: Secondary | ICD-10-CM | POA: Diagnosis not present

## 2018-07-28 DIAGNOSIS — E1140 Type 2 diabetes mellitus with diabetic neuropathy, unspecified: Secondary | ICD-10-CM | POA: Diagnosis not present

## 2018-07-31 DIAGNOSIS — E1140 Type 2 diabetes mellitus with diabetic neuropathy, unspecified: Secondary | ICD-10-CM | POA: Diagnosis not present

## 2018-07-31 DIAGNOSIS — L03115 Cellulitis of right lower limb: Secondary | ICD-10-CM | POA: Diagnosis not present

## 2018-07-31 DIAGNOSIS — I4819 Other persistent atrial fibrillation: Secondary | ICD-10-CM | POA: Diagnosis not present

## 2018-07-31 DIAGNOSIS — I5032 Chronic diastolic (congestive) heart failure: Secondary | ICD-10-CM | POA: Diagnosis not present

## 2018-08-02 ENCOUNTER — Telehealth: Payer: Self-pay | Admitting: *Deleted

## 2018-08-02 DIAGNOSIS — Z20828 Contact with and (suspected) exposure to other viral communicable diseases: Secondary | ICD-10-CM | POA: Diagnosis not present

## 2018-08-02 NOTE — Telephone Encounter (Signed)
Unable to leave a message, no voicemail.  

## 2018-08-05 IMAGING — CT CT HEAD W/O CM
3 of 4 series · 16 of 47 positions shown, 19 images · non-contrast
Comparison: None.

CLINICAL DATA: Dizziness

EXAM:
CT HEAD WITHOUT CONTRAST
TECHNIQUE: Contiguous axial images were obtained from the base of the skull
through the vertex without intravenous contrast.

[Series 2: head w/o · axial · non-contrast · 0.43mm/px · z∈[+1481,+1621]mm · 10 of 34 slices shown, 13 images]
[im 3/34  brain]
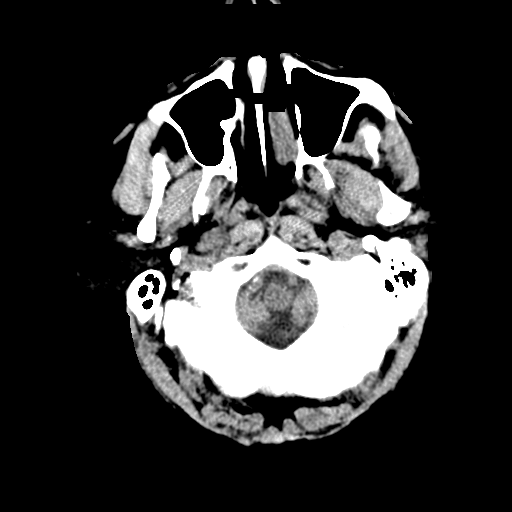
[im 3/34  bone]
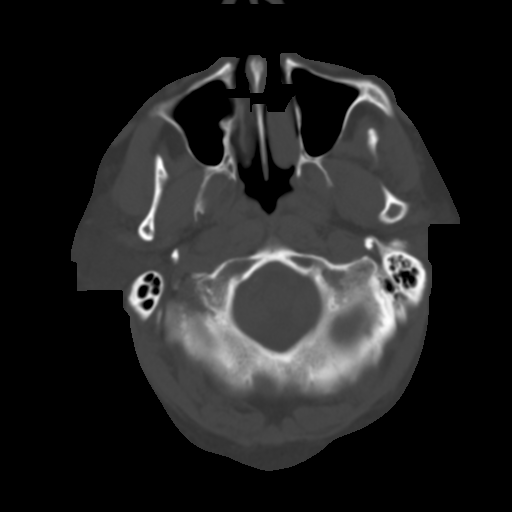
[im 5/34  brain]
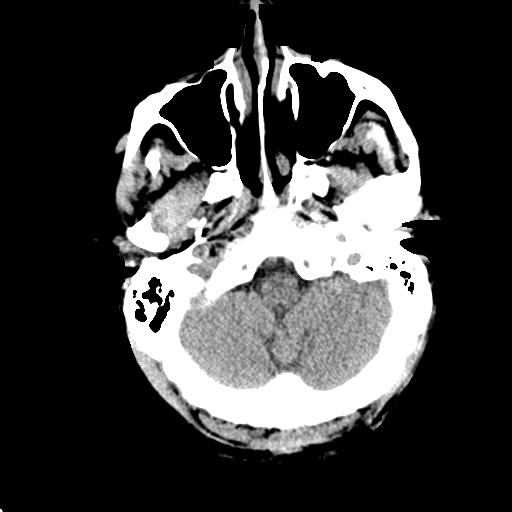
[im 10/34  brain]
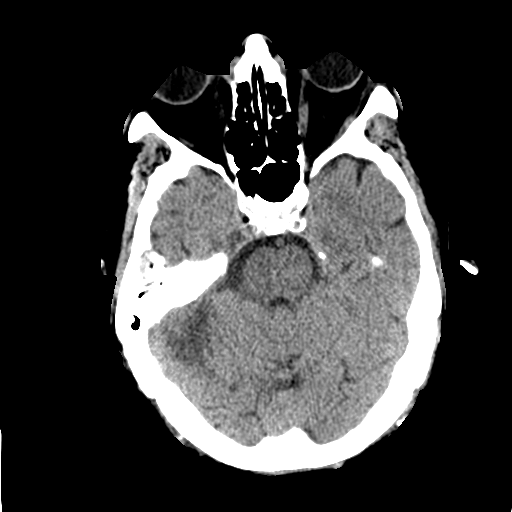
[im 12/34  brain]
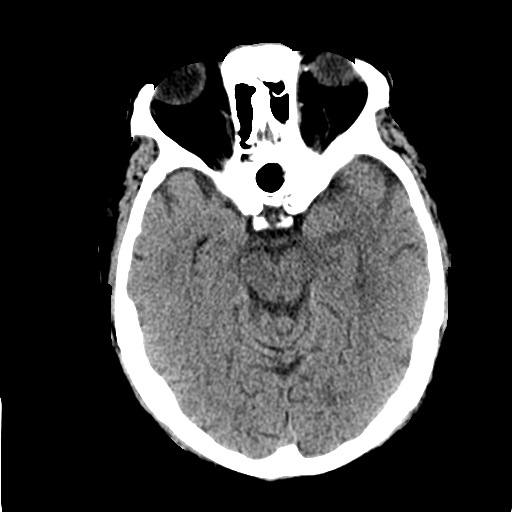
[im 15/34  brain]
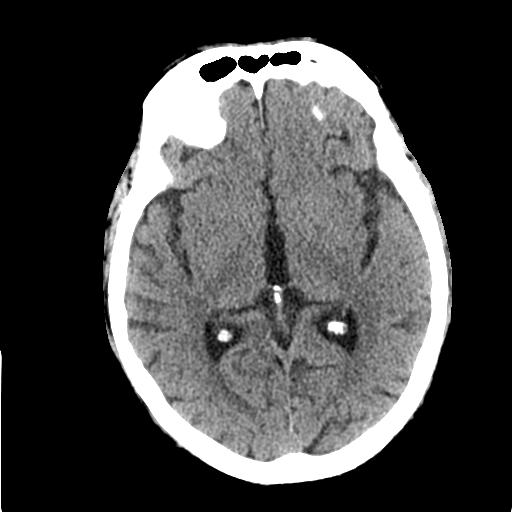
[im 15/34  bone]
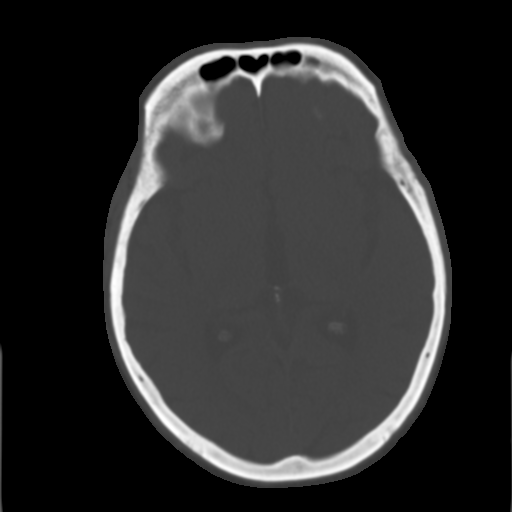
[im 19/34  brain]
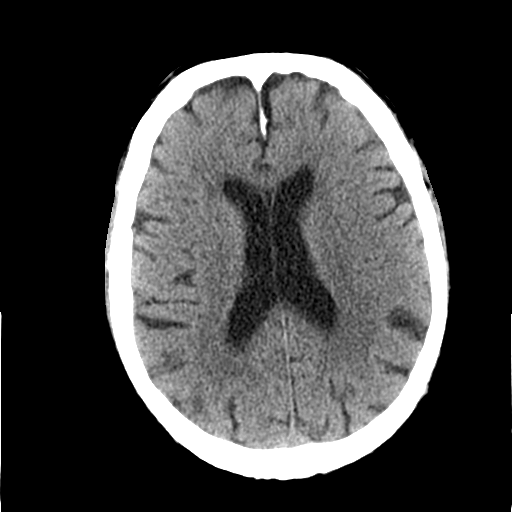
[im 22/34  brain]
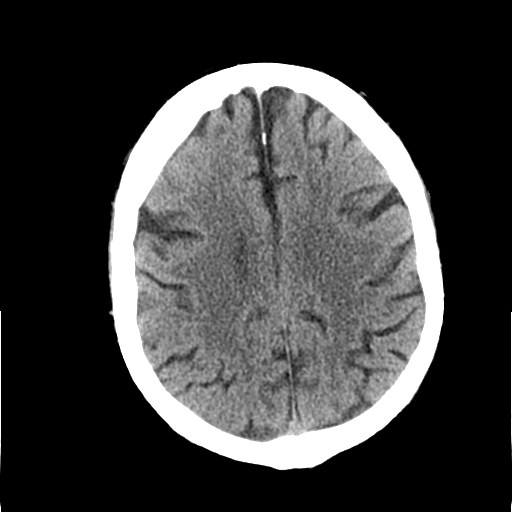
[im 24/34  brain]
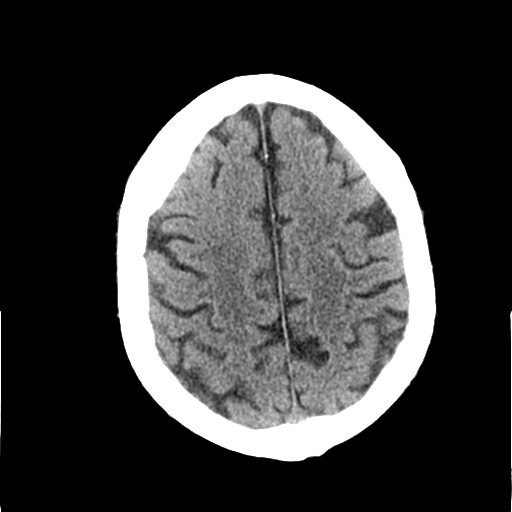
[im 29/34  brain]
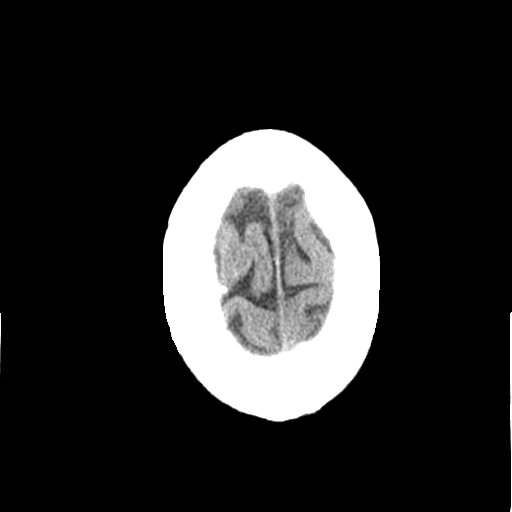
[im 29/34  bone]
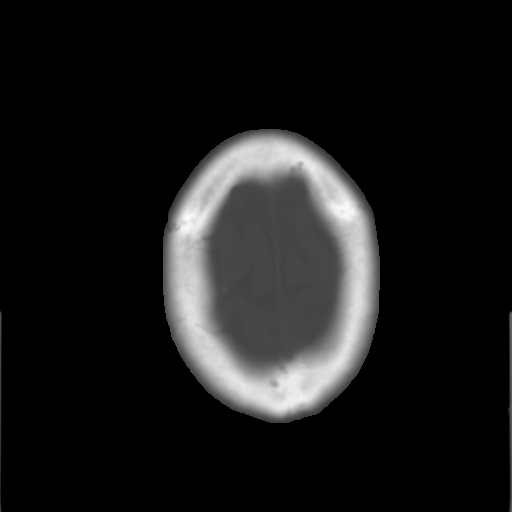
[im 31/34  brain]
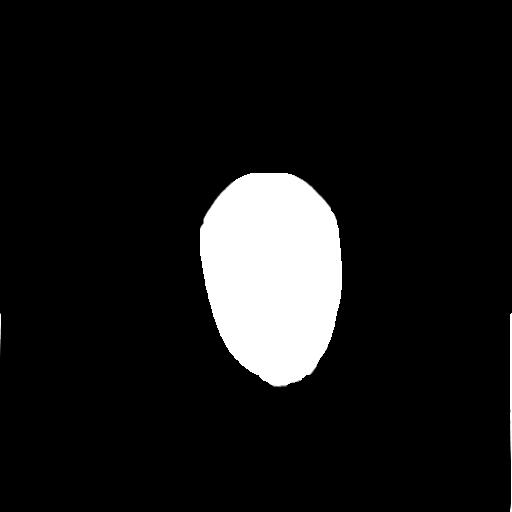

[Series 5: coronal · coronal · 0.33mm/px · 3 of 70 slices shown]
[im 24/70  brain]
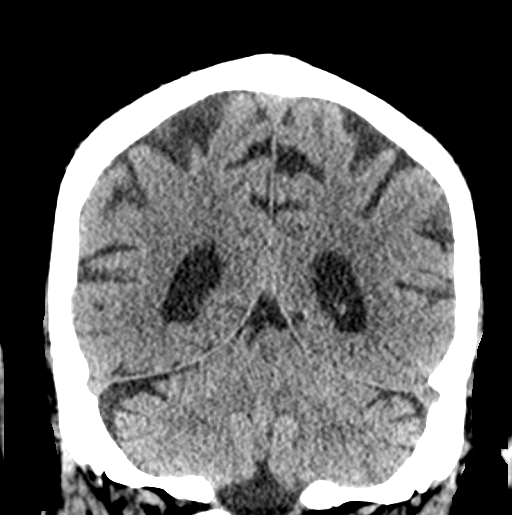
[im 31/70  brain]
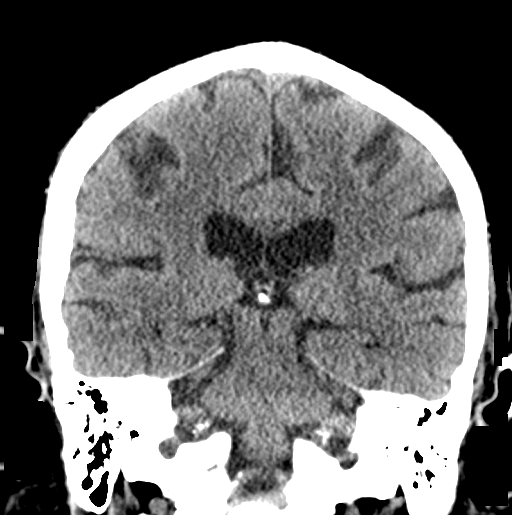
[im 39/70  brain]
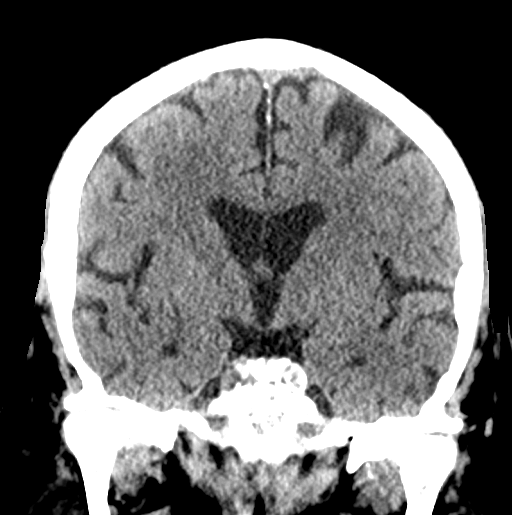

[Series 6: sagittal · sagittal · 0.31mm/px · 3 of 55 slices shown]
[im 19/55  brain]
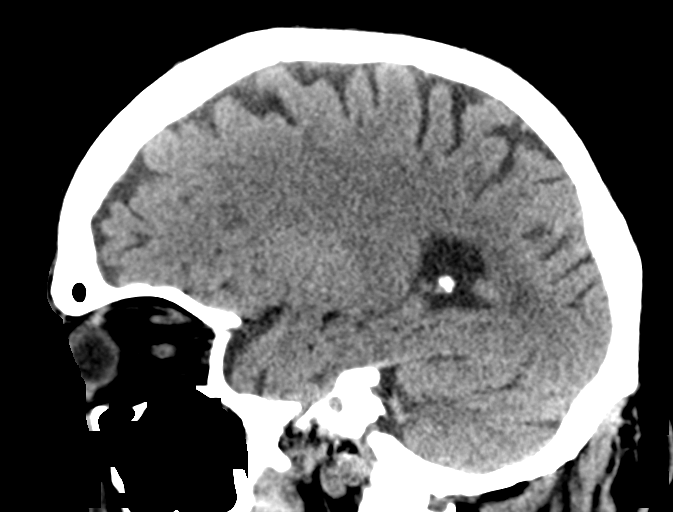
[im 28/55  brain]
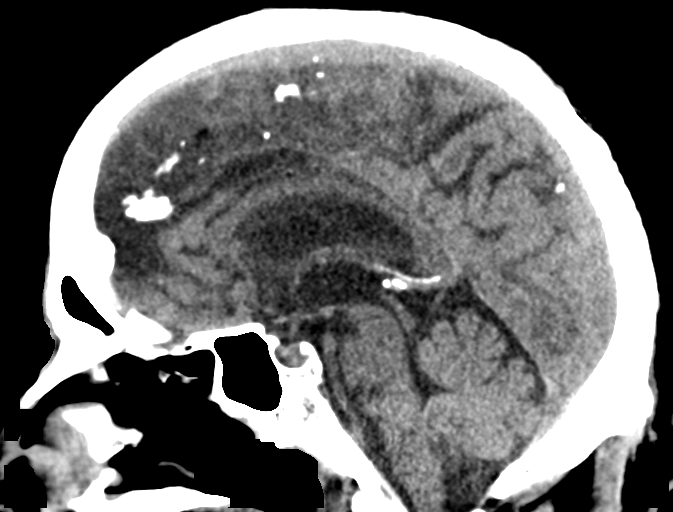
[im 37/55  brain]
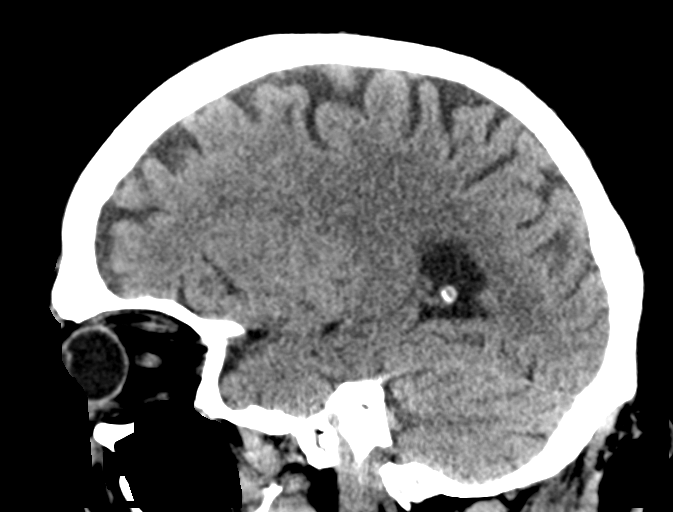

[16 of 47 positions shown; findings below may reference images not displayed]

FINDINGS: Brain: No evidence of acute infarction, hemorrhage, hydrocephalus,
extra-axial collection or mass lesion/mass effect.

Vascular: No hyperdense vessel or unexpected calcification.

Skull: No osseous abnormality.

Sinuses/Orbits: Visualized paranasal sinuses are clear. Visualized
mastoid sinuses are clear. Visualized orbits demonstrate no focal
abnormality.

Other: None
IMPRESSION: No acute intracranial pathology.

## 2018-08-09 DIAGNOSIS — I5032 Chronic diastolic (congestive) heart failure: Secondary | ICD-10-CM | POA: Diagnosis not present

## 2018-08-09 DIAGNOSIS — D649 Anemia, unspecified: Secondary | ICD-10-CM | POA: Diagnosis not present

## 2018-08-09 DIAGNOSIS — Z20828 Contact with and (suspected) exposure to other viral communicable diseases: Secondary | ICD-10-CM | POA: Diagnosis not present

## 2018-08-09 DIAGNOSIS — I4819 Other persistent atrial fibrillation: Secondary | ICD-10-CM | POA: Diagnosis not present

## 2018-08-09 DIAGNOSIS — L03115 Cellulitis of right lower limb: Secondary | ICD-10-CM | POA: Diagnosis not present

## 2018-08-16 DIAGNOSIS — Z20828 Contact with and (suspected) exposure to other viral communicable diseases: Secondary | ICD-10-CM | POA: Diagnosis not present

## 2018-08-16 DIAGNOSIS — R400 Somnolence: Secondary | ICD-10-CM | POA: Diagnosis not present

## 2018-08-16 DIAGNOSIS — I5032 Chronic diastolic (congestive) heart failure: Secondary | ICD-10-CM | POA: Diagnosis not present

## 2018-08-16 DIAGNOSIS — J449 Chronic obstructive pulmonary disease, unspecified: Secondary | ICD-10-CM | POA: Diagnosis not present

## 2018-08-16 DIAGNOSIS — I4819 Other persistent atrial fibrillation: Secondary | ICD-10-CM | POA: Diagnosis not present

## 2018-08-17 DIAGNOSIS — R319 Hematuria, unspecified: Secondary | ICD-10-CM | POA: Diagnosis not present

## 2018-08-17 DIAGNOSIS — J449 Chronic obstructive pulmonary disease, unspecified: Secondary | ICD-10-CM | POA: Diagnosis not present

## 2018-08-17 DIAGNOSIS — Z79899 Other long term (current) drug therapy: Secondary | ICD-10-CM | POA: Diagnosis not present

## 2018-08-17 DIAGNOSIS — D649 Anemia, unspecified: Secondary | ICD-10-CM | POA: Diagnosis not present

## 2018-08-17 DIAGNOSIS — N390 Urinary tract infection, site not specified: Secondary | ICD-10-CM | POA: Diagnosis not present

## 2018-08-17 DIAGNOSIS — E119 Type 2 diabetes mellitus without complications: Secondary | ICD-10-CM | POA: Diagnosis not present

## 2018-08-18 DIAGNOSIS — I5032 Chronic diastolic (congestive) heart failure: Secondary | ICD-10-CM | POA: Diagnosis not present

## 2018-08-18 DIAGNOSIS — I872 Venous insufficiency (chronic) (peripheral): Secondary | ICD-10-CM | POA: Diagnosis not present

## 2018-08-18 DIAGNOSIS — J449 Chronic obstructive pulmonary disease, unspecified: Secondary | ICD-10-CM | POA: Diagnosis not present

## 2018-08-18 DIAGNOSIS — R400 Somnolence: Secondary | ICD-10-CM | POA: Diagnosis not present

## 2018-08-22 DIAGNOSIS — J449 Chronic obstructive pulmonary disease, unspecified: Secondary | ICD-10-CM | POA: Diagnosis not present

## 2018-08-22 DIAGNOSIS — Z20828 Contact with and (suspected) exposure to other viral communicable diseases: Secondary | ICD-10-CM | POA: Diagnosis not present

## 2018-08-22 DIAGNOSIS — G4733 Obstructive sleep apnea (adult) (pediatric): Secondary | ICD-10-CM | POA: Diagnosis not present

## 2018-08-29 DIAGNOSIS — I5033 Acute on chronic diastolic (congestive) heart failure: Secondary | ICD-10-CM | POA: Diagnosis not present

## 2018-08-29 DIAGNOSIS — G4733 Obstructive sleep apnea (adult) (pediatric): Secondary | ICD-10-CM | POA: Diagnosis not present

## 2018-08-29 DIAGNOSIS — E785 Hyperlipidemia, unspecified: Secondary | ICD-10-CM | POA: Diagnosis not present

## 2018-08-29 DIAGNOSIS — I4891 Unspecified atrial fibrillation: Secondary | ICD-10-CM | POA: Diagnosis not present

## 2018-08-29 DIAGNOSIS — F319 Bipolar disorder, unspecified: Secondary | ICD-10-CM | POA: Diagnosis not present

## 2018-08-29 DIAGNOSIS — L03116 Cellulitis of left lower limb: Secondary | ICD-10-CM | POA: Diagnosis not present

## 2018-08-29 DIAGNOSIS — J9611 Chronic respiratory failure with hypoxia: Secondary | ICD-10-CM | POA: Diagnosis not present

## 2018-08-29 DIAGNOSIS — E11621 Type 2 diabetes mellitus with foot ulcer: Secondary | ICD-10-CM | POA: Diagnosis not present

## 2018-08-29 DIAGNOSIS — R2689 Other abnormalities of gait and mobility: Secondary | ICD-10-CM | POA: Diagnosis not present

## 2018-08-29 DIAGNOSIS — J449 Chronic obstructive pulmonary disease, unspecified: Secondary | ICD-10-CM | POA: Diagnosis not present

## 2018-08-29 DIAGNOSIS — E1169 Type 2 diabetes mellitus with other specified complication: Secondary | ICD-10-CM | POA: Diagnosis not present

## 2018-08-29 DIAGNOSIS — L89329 Pressure ulcer of left buttock, unspecified stage: Secondary | ICD-10-CM | POA: Diagnosis not present

## 2018-08-29 DIAGNOSIS — R278 Other lack of coordination: Secondary | ICD-10-CM | POA: Diagnosis not present

## 2018-08-30 DIAGNOSIS — I872 Venous insufficiency (chronic) (peripheral): Secondary | ICD-10-CM | POA: Diagnosis not present

## 2018-08-30 DIAGNOSIS — I4819 Other persistent atrial fibrillation: Secondary | ICD-10-CM | POA: Diagnosis not present

## 2018-08-30 DIAGNOSIS — I5032 Chronic diastolic (congestive) heart failure: Secondary | ICD-10-CM | POA: Diagnosis not present

## 2018-08-30 DIAGNOSIS — L89312 Pressure ulcer of right buttock, stage 2: Secondary | ICD-10-CM | POA: Diagnosis not present

## 2018-08-31 DIAGNOSIS — L89153 Pressure ulcer of sacral region, stage 3: Secondary | ICD-10-CM | POA: Diagnosis not present

## 2018-09-04 DIAGNOSIS — F319 Bipolar disorder, unspecified: Secondary | ICD-10-CM | POA: Diagnosis not present

## 2018-09-04 DIAGNOSIS — G4733 Obstructive sleep apnea (adult) (pediatric): Secondary | ICD-10-CM | POA: Diagnosis not present

## 2018-09-04 DIAGNOSIS — I5033 Acute on chronic diastolic (congestive) heart failure: Secondary | ICD-10-CM | POA: Diagnosis not present

## 2018-09-04 DIAGNOSIS — R278 Other lack of coordination: Secondary | ICD-10-CM | POA: Diagnosis not present

## 2018-09-04 DIAGNOSIS — J9611 Chronic respiratory failure with hypoxia: Secondary | ICD-10-CM | POA: Diagnosis not present

## 2018-09-04 DIAGNOSIS — E1169 Type 2 diabetes mellitus with other specified complication: Secondary | ICD-10-CM | POA: Diagnosis not present

## 2018-09-04 DIAGNOSIS — E11621 Type 2 diabetes mellitus with foot ulcer: Secondary | ICD-10-CM | POA: Diagnosis not present

## 2018-09-04 DIAGNOSIS — J449 Chronic obstructive pulmonary disease, unspecified: Secondary | ICD-10-CM | POA: Diagnosis not present

## 2018-09-04 DIAGNOSIS — E785 Hyperlipidemia, unspecified: Secondary | ICD-10-CM | POA: Diagnosis not present

## 2018-09-04 DIAGNOSIS — L03116 Cellulitis of left lower limb: Secondary | ICD-10-CM | POA: Diagnosis not present

## 2018-09-04 DIAGNOSIS — I4891 Unspecified atrial fibrillation: Secondary | ICD-10-CM | POA: Diagnosis not present

## 2018-09-04 DIAGNOSIS — R2689 Other abnormalities of gait and mobility: Secondary | ICD-10-CM | POA: Diagnosis not present

## 2018-09-04 DIAGNOSIS — L89329 Pressure ulcer of left buttock, unspecified stage: Secondary | ICD-10-CM | POA: Diagnosis not present

## 2018-09-07 DIAGNOSIS — L89153 Pressure ulcer of sacral region, stage 3: Secondary | ICD-10-CM | POA: Diagnosis not present

## 2018-09-13 DIAGNOSIS — R400 Somnolence: Secondary | ICD-10-CM | POA: Diagnosis not present

## 2018-09-13 DIAGNOSIS — I5032 Chronic diastolic (congestive) heart failure: Secondary | ICD-10-CM | POA: Diagnosis not present

## 2018-09-13 DIAGNOSIS — F319 Bipolar disorder, unspecified: Secondary | ICD-10-CM | POA: Diagnosis not present

## 2018-09-13 DIAGNOSIS — J449 Chronic obstructive pulmonary disease, unspecified: Secondary | ICD-10-CM | POA: Diagnosis not present

## 2018-09-14 DIAGNOSIS — L89153 Pressure ulcer of sacral region, stage 3: Secondary | ICD-10-CM | POA: Diagnosis not present

## 2018-09-15 DIAGNOSIS — N390 Urinary tract infection, site not specified: Secondary | ICD-10-CM | POA: Diagnosis not present

## 2018-09-15 DIAGNOSIS — R319 Hematuria, unspecified: Secondary | ICD-10-CM | POA: Diagnosis not present

## 2018-09-15 DIAGNOSIS — Z79899 Other long term (current) drug therapy: Secondary | ICD-10-CM | POA: Diagnosis not present

## 2018-09-22 DIAGNOSIS — I509 Heart failure, unspecified: Secondary | ICD-10-CM | POA: Diagnosis not present

## 2018-09-22 DIAGNOSIS — E1122 Type 2 diabetes mellitus with diabetic chronic kidney disease: Secondary | ICD-10-CM | POA: Diagnosis not present

## 2018-09-22 DIAGNOSIS — R600 Localized edema: Secondary | ICD-10-CM | POA: Diagnosis not present

## 2018-09-22 DIAGNOSIS — D500 Iron deficiency anemia secondary to blood loss (chronic): Secondary | ICD-10-CM | POA: Diagnosis not present

## 2018-09-22 DIAGNOSIS — N183 Chronic kidney disease, stage 3 (moderate): Secondary | ICD-10-CM | POA: Diagnosis not present

## 2018-09-22 DIAGNOSIS — F322 Major depressive disorder, single episode, severe without psychotic features: Secondary | ICD-10-CM | POA: Diagnosis not present

## 2018-09-22 DIAGNOSIS — I4891 Unspecified atrial fibrillation: Secondary | ICD-10-CM | POA: Diagnosis not present

## 2018-09-22 DIAGNOSIS — I10 Essential (primary) hypertension: Secondary | ICD-10-CM | POA: Diagnosis not present

## 2018-09-25 DIAGNOSIS — I4819 Other persistent atrial fibrillation: Secondary | ICD-10-CM | POA: Diagnosis not present

## 2018-09-25 DIAGNOSIS — L89612 Pressure ulcer of right heel, stage 2: Secondary | ICD-10-CM | POA: Diagnosis not present

## 2018-09-25 DIAGNOSIS — I5032 Chronic diastolic (congestive) heart failure: Secondary | ICD-10-CM | POA: Diagnosis not present

## 2018-09-25 DIAGNOSIS — M25511 Pain in right shoulder: Secondary | ICD-10-CM | POA: Diagnosis not present

## 2018-09-26 DIAGNOSIS — F339 Major depressive disorder, recurrent, unspecified: Secondary | ICD-10-CM | POA: Diagnosis not present

## 2018-09-26 DIAGNOSIS — F319 Bipolar disorder, unspecified: Secondary | ICD-10-CM | POA: Diagnosis not present

## 2018-09-26 DIAGNOSIS — F5105 Insomnia due to other mental disorder: Secondary | ICD-10-CM | POA: Diagnosis not present

## 2018-09-27 ENCOUNTER — Telehealth: Payer: Self-pay | Admitting: Orthopaedic Surgery

## 2018-09-27 DIAGNOSIS — E785 Hyperlipidemia, unspecified: Secondary | ICD-10-CM | POA: Diagnosis not present

## 2018-09-27 NOTE — Telephone Encounter (Signed)
Joel [name] with [facility] facility called in asking for a call back from betsy or dr.yates to clarify an injection that pt says he gets from dr.yates for his shoulder pain.   [phone]

## 2018-09-27 NOTE — Telephone Encounter (Signed)
I called and spoke with Lennette Bihari. He states that patient told him he has seen Dr. Lorin Mercy for shoulder pain and he is crying in pain and would like to see him for an injection. Lennette Bihari questioned if we had injected patient's shoulder before. I was unable to find shoulder injection in chart, but did offer an appt so that Dr. Lorin Mercy could examine patient if that is what they would like to do. Lennette Bihari asked for appt mid to late September.  Appt made for Wed 9/16.

## 2018-09-28 DIAGNOSIS — L89610 Pressure ulcer of right heel, unstageable: Secondary | ICD-10-CM | POA: Diagnosis not present

## 2018-10-03 DIAGNOSIS — I4819 Other persistent atrial fibrillation: Secondary | ICD-10-CM | POA: Diagnosis not present

## 2018-10-03 DIAGNOSIS — L89616 Pressure-induced deep tissue damage of right heel: Secondary | ICD-10-CM | POA: Diagnosis not present

## 2018-10-03 DIAGNOSIS — F319 Bipolar disorder, unspecified: Secondary | ICD-10-CM | POA: Diagnosis not present

## 2018-10-03 DIAGNOSIS — I5032 Chronic diastolic (congestive) heart failure: Secondary | ICD-10-CM | POA: Diagnosis not present

## 2018-10-05 DIAGNOSIS — L89610 Pressure ulcer of right heel, unstageable: Secondary | ICD-10-CM | POA: Diagnosis not present

## 2018-10-05 DIAGNOSIS — F339 Major depressive disorder, recurrent, unspecified: Secondary | ICD-10-CM | POA: Diagnosis not present

## 2018-10-10 DIAGNOSIS — I509 Heart failure, unspecified: Secondary | ICD-10-CM | POA: Diagnosis not present

## 2018-10-10 DIAGNOSIS — D649 Anemia, unspecified: Secondary | ICD-10-CM | POA: Diagnosis not present

## 2018-10-12 DIAGNOSIS — F319 Bipolar disorder, unspecified: Secondary | ICD-10-CM | POA: Diagnosis not present

## 2018-10-12 DIAGNOSIS — E119 Type 2 diabetes mellitus without complications: Secondary | ICD-10-CM | POA: Diagnosis not present

## 2018-10-12 DIAGNOSIS — L89610 Pressure ulcer of right heel, unstageable: Secondary | ICD-10-CM | POA: Diagnosis not present

## 2018-10-17 DIAGNOSIS — F3132 Bipolar disorder, current episode depressed, moderate: Secondary | ICD-10-CM | POA: Diagnosis not present

## 2018-10-17 DIAGNOSIS — F5105 Insomnia due to other mental disorder: Secondary | ICD-10-CM | POA: Diagnosis not present

## 2018-10-18 ENCOUNTER — Ambulatory Visit: Payer: PPO | Admitting: Orthopaedic Surgery

## 2018-10-19 DIAGNOSIS — F339 Major depressive disorder, recurrent, unspecified: Secondary | ICD-10-CM | POA: Diagnosis not present

## 2018-10-19 DIAGNOSIS — E1140 Type 2 diabetes mellitus with diabetic neuropathy, unspecified: Secondary | ICD-10-CM | POA: Diagnosis not present

## 2018-10-19 DIAGNOSIS — L89610 Pressure ulcer of right heel, unstageable: Secondary | ICD-10-CM | POA: Diagnosis not present

## 2018-10-19 DIAGNOSIS — I4819 Other persistent atrial fibrillation: Secondary | ICD-10-CM | POA: Diagnosis not present

## 2018-10-19 DIAGNOSIS — F319 Bipolar disorder, unspecified: Secondary | ICD-10-CM | POA: Diagnosis not present

## 2018-10-19 DIAGNOSIS — M25561 Pain in right knee: Secondary | ICD-10-CM | POA: Diagnosis not present

## 2018-10-19 DIAGNOSIS — I5032 Chronic diastolic (congestive) heart failure: Secondary | ICD-10-CM | POA: Diagnosis not present

## 2018-10-23 DIAGNOSIS — F319 Bipolar disorder, unspecified: Secondary | ICD-10-CM | POA: Diagnosis not present

## 2018-10-25 DIAGNOSIS — I5032 Chronic diastolic (congestive) heart failure: Secondary | ICD-10-CM | POA: Diagnosis not present

## 2018-10-25 DIAGNOSIS — M25561 Pain in right knee: Secondary | ICD-10-CM | POA: Diagnosis not present

## 2018-10-25 DIAGNOSIS — I4819 Other persistent atrial fibrillation: Secondary | ICD-10-CM | POA: Diagnosis not present

## 2018-10-25 DIAGNOSIS — E1140 Type 2 diabetes mellitus with diabetic neuropathy, unspecified: Secondary | ICD-10-CM | POA: Diagnosis not present

## 2018-10-26 DIAGNOSIS — L89610 Pressure ulcer of right heel, unstageable: Secondary | ICD-10-CM | POA: Diagnosis not present

## 2018-10-27 DIAGNOSIS — D649 Anemia, unspecified: Secondary | ICD-10-CM | POA: Diagnosis not present

## 2018-10-27 DIAGNOSIS — E039 Hypothyroidism, unspecified: Secondary | ICD-10-CM | POA: Diagnosis not present

## 2018-10-27 DIAGNOSIS — I509 Heart failure, unspecified: Secondary | ICD-10-CM | POA: Diagnosis not present

## 2018-10-27 DIAGNOSIS — E119 Type 2 diabetes mellitus without complications: Secondary | ICD-10-CM | POA: Diagnosis not present

## 2018-10-28 DIAGNOSIS — I509 Heart failure, unspecified: Secondary | ICD-10-CM | POA: Diagnosis not present

## 2018-10-31 DIAGNOSIS — F3132 Bipolar disorder, current episode depressed, moderate: Secondary | ICD-10-CM | POA: Diagnosis not present

## 2018-10-31 DIAGNOSIS — F5105 Insomnia due to other mental disorder: Secondary | ICD-10-CM | POA: Diagnosis not present

## 2018-11-02 DIAGNOSIS — F3132 Bipolar disorder, current episode depressed, moderate: Secondary | ICD-10-CM | POA: Diagnosis not present

## 2018-11-02 DIAGNOSIS — Z20828 Contact with and (suspected) exposure to other viral communicable diseases: Secondary | ICD-10-CM | POA: Diagnosis not present

## 2018-11-08 DIAGNOSIS — Z20828 Contact with and (suspected) exposure to other viral communicable diseases: Secondary | ICD-10-CM | POA: Diagnosis not present

## 2018-11-09 DIAGNOSIS — L89610 Pressure ulcer of right heel, unstageable: Secondary | ICD-10-CM | POA: Diagnosis not present

## 2018-11-10 DIAGNOSIS — D600 Chronic acquired pure red cell aplasia: Secondary | ICD-10-CM | POA: Diagnosis not present

## 2018-11-10 DIAGNOSIS — I509 Heart failure, unspecified: Secondary | ICD-10-CM | POA: Diagnosis not present

## 2018-11-10 DIAGNOSIS — F322 Major depressive disorder, single episode, severe without psychotic features: Secondary | ICD-10-CM | POA: Diagnosis not present

## 2018-11-10 DIAGNOSIS — R600 Localized edema: Secondary | ICD-10-CM | POA: Diagnosis not present

## 2018-11-10 DIAGNOSIS — I4891 Unspecified atrial fibrillation: Secondary | ICD-10-CM | POA: Diagnosis not present

## 2018-11-10 DIAGNOSIS — I10 Essential (primary) hypertension: Secondary | ICD-10-CM | POA: Diagnosis not present

## 2018-11-10 DIAGNOSIS — N183 Chronic kidney disease, stage 3 unspecified: Secondary | ICD-10-CM | POA: Diagnosis not present

## 2018-11-10 DIAGNOSIS — E1122 Type 2 diabetes mellitus with diabetic chronic kidney disease: Secondary | ICD-10-CM | POA: Diagnosis not present

## 2018-11-11 DIAGNOSIS — R6889 Other general symptoms and signs: Secondary | ICD-10-CM | POA: Diagnosis not present

## 2018-11-11 DIAGNOSIS — D649 Anemia, unspecified: Secondary | ICD-10-CM | POA: Diagnosis not present

## 2018-11-13 DIAGNOSIS — F339 Major depressive disorder, recurrent, unspecified: Secondary | ICD-10-CM | POA: Diagnosis not present

## 2018-11-14 DIAGNOSIS — F5105 Insomnia due to other mental disorder: Secondary | ICD-10-CM | POA: Diagnosis not present

## 2018-11-14 DIAGNOSIS — F3132 Bipolar disorder, current episode depressed, moderate: Secondary | ICD-10-CM | POA: Diagnosis not present

## 2018-11-16 DIAGNOSIS — R278 Other lack of coordination: Secondary | ICD-10-CM | POA: Diagnosis not present

## 2018-11-16 DIAGNOSIS — R2689 Other abnormalities of gait and mobility: Secondary | ICD-10-CM | POA: Diagnosis not present

## 2018-11-16 DIAGNOSIS — I5033 Acute on chronic diastolic (congestive) heart failure: Secondary | ICD-10-CM | POA: Diagnosis not present

## 2018-11-16 DIAGNOSIS — L03116 Cellulitis of left lower limb: Secondary | ICD-10-CM | POA: Diagnosis not present

## 2018-11-16 DIAGNOSIS — G4733 Obstructive sleep apnea (adult) (pediatric): Secondary | ICD-10-CM | POA: Diagnosis not present

## 2018-11-16 DIAGNOSIS — J449 Chronic obstructive pulmonary disease, unspecified: Secondary | ICD-10-CM | POA: Diagnosis not present

## 2018-11-16 DIAGNOSIS — E11621 Type 2 diabetes mellitus with foot ulcer: Secondary | ICD-10-CM | POA: Diagnosis not present

## 2018-11-16 DIAGNOSIS — E785 Hyperlipidemia, unspecified: Secondary | ICD-10-CM | POA: Diagnosis not present

## 2018-11-16 DIAGNOSIS — L89329 Pressure ulcer of left buttock, unspecified stage: Secondary | ICD-10-CM | POA: Diagnosis not present

## 2018-11-16 DIAGNOSIS — I4891 Unspecified atrial fibrillation: Secondary | ICD-10-CM | POA: Diagnosis not present

## 2018-11-16 DIAGNOSIS — E1169 Type 2 diabetes mellitus with other specified complication: Secondary | ICD-10-CM | POA: Diagnosis not present

## 2018-11-16 DIAGNOSIS — F319 Bipolar disorder, unspecified: Secondary | ICD-10-CM | POA: Diagnosis not present

## 2018-11-16 DIAGNOSIS — J9611 Chronic respiratory failure with hypoxia: Secondary | ICD-10-CM | POA: Diagnosis not present

## 2018-11-20 ENCOUNTER — Telehealth: Payer: Self-pay | Admitting: *Deleted

## 2018-11-20 DIAGNOSIS — F3132 Bipolar disorder, current episode depressed, moderate: Secondary | ICD-10-CM | POA: Diagnosis not present

## 2018-11-20 NOTE — Telephone Encounter (Signed)
A message was left, re: his follow up visit. 

## 2018-11-21 DIAGNOSIS — Z20828 Contact with and (suspected) exposure to other viral communicable diseases: Secondary | ICD-10-CM | POA: Diagnosis not present

## 2018-11-28 DIAGNOSIS — Z20828 Contact with and (suspected) exposure to other viral communicable diseases: Secondary | ICD-10-CM | POA: Diagnosis not present

## 2018-12-01 DIAGNOSIS — E1140 Type 2 diabetes mellitus with diabetic neuropathy, unspecified: Secondary | ICD-10-CM | POA: Diagnosis not present

## 2018-12-01 DIAGNOSIS — I5032 Chronic diastolic (congestive) heart failure: Secondary | ICD-10-CM | POA: Diagnosis not present

## 2018-12-01 DIAGNOSIS — I4819 Other persistent atrial fibrillation: Secondary | ICD-10-CM | POA: Diagnosis not present

## 2018-12-01 DIAGNOSIS — M25561 Pain in right knee: Secondary | ICD-10-CM | POA: Diagnosis not present

## 2018-12-04 DIAGNOSIS — E11621 Type 2 diabetes mellitus with foot ulcer: Secondary | ICD-10-CM | POA: Diagnosis not present

## 2018-12-04 DIAGNOSIS — I4891 Unspecified atrial fibrillation: Secondary | ICD-10-CM | POA: Diagnosis not present

## 2018-12-04 DIAGNOSIS — F3132 Bipolar disorder, current episode depressed, moderate: Secondary | ICD-10-CM | POA: Diagnosis not present

## 2018-12-04 DIAGNOSIS — J449 Chronic obstructive pulmonary disease, unspecified: Secondary | ICD-10-CM | POA: Diagnosis not present

## 2018-12-04 DIAGNOSIS — R278 Other lack of coordination: Secondary | ICD-10-CM | POA: Diagnosis not present

## 2018-12-04 DIAGNOSIS — L89329 Pressure ulcer of left buttock, unspecified stage: Secondary | ICD-10-CM | POA: Diagnosis not present

## 2018-12-04 DIAGNOSIS — L03116 Cellulitis of left lower limb: Secondary | ICD-10-CM | POA: Diagnosis not present

## 2018-12-04 DIAGNOSIS — J9611 Chronic respiratory failure with hypoxia: Secondary | ICD-10-CM | POA: Diagnosis not present

## 2018-12-04 DIAGNOSIS — E785 Hyperlipidemia, unspecified: Secondary | ICD-10-CM | POA: Diagnosis not present

## 2018-12-04 DIAGNOSIS — I5033 Acute on chronic diastolic (congestive) heart failure: Secondary | ICD-10-CM | POA: Diagnosis not present

## 2018-12-04 DIAGNOSIS — R2689 Other abnormalities of gait and mobility: Secondary | ICD-10-CM | POA: Diagnosis not present

## 2018-12-04 DIAGNOSIS — F319 Bipolar disorder, unspecified: Secondary | ICD-10-CM | POA: Diagnosis not present

## 2018-12-04 DIAGNOSIS — E1169 Type 2 diabetes mellitus with other specified complication: Secondary | ICD-10-CM | POA: Diagnosis not present

## 2018-12-04 DIAGNOSIS — G4733 Obstructive sleep apnea (adult) (pediatric): Secondary | ICD-10-CM | POA: Diagnosis not present

## 2018-12-05 DIAGNOSIS — Z20828 Contact with and (suspected) exposure to other viral communicable diseases: Secondary | ICD-10-CM | POA: Diagnosis not present

## 2018-12-07 ENCOUNTER — Telehealth: Payer: Self-pay | Admitting: *Deleted

## 2018-12-07 NOTE — Telephone Encounter (Signed)
We have left several message,re: his follow up visit.

## 2018-12-08 DIAGNOSIS — I4891 Unspecified atrial fibrillation: Secondary | ICD-10-CM | POA: Diagnosis not present

## 2018-12-08 DIAGNOSIS — D500 Iron deficiency anemia secondary to blood loss (chronic): Secondary | ICD-10-CM | POA: Diagnosis not present

## 2018-12-08 DIAGNOSIS — E1122 Type 2 diabetes mellitus with diabetic chronic kidney disease: Secondary | ICD-10-CM | POA: Diagnosis not present

## 2018-12-08 DIAGNOSIS — F322 Major depressive disorder, single episode, severe without psychotic features: Secondary | ICD-10-CM | POA: Diagnosis not present

## 2018-12-08 DIAGNOSIS — N183 Chronic kidney disease, stage 3 unspecified: Secondary | ICD-10-CM | POA: Diagnosis not present

## 2018-12-08 DIAGNOSIS — I10 Essential (primary) hypertension: Secondary | ICD-10-CM | POA: Diagnosis not present

## 2018-12-08 DIAGNOSIS — I509 Heart failure, unspecified: Secondary | ICD-10-CM | POA: Diagnosis not present

## 2018-12-08 DIAGNOSIS — R600 Localized edema: Secondary | ICD-10-CM | POA: Diagnosis not present

## 2018-12-11 DIAGNOSIS — F3132 Bipolar disorder, current episode depressed, moderate: Secondary | ICD-10-CM | POA: Diagnosis not present

## 2018-12-12 DIAGNOSIS — Z20828 Contact with and (suspected) exposure to other viral communicable diseases: Secondary | ICD-10-CM | POA: Diagnosis not present

## 2018-12-18 DIAGNOSIS — F3132 Bipolar disorder, current episode depressed, moderate: Secondary | ICD-10-CM | POA: Diagnosis not present

## 2018-12-19 DIAGNOSIS — Z20828 Contact with and (suspected) exposure to other viral communicable diseases: Secondary | ICD-10-CM | POA: Diagnosis not present

## 2018-12-19 DIAGNOSIS — F5105 Insomnia due to other mental disorder: Secondary | ICD-10-CM | POA: Diagnosis not present

## 2018-12-19 DIAGNOSIS — F3132 Bipolar disorder, current episode depressed, moderate: Secondary | ICD-10-CM | POA: Diagnosis not present

## 2018-12-25 DIAGNOSIS — R5381 Other malaise: Secondary | ICD-10-CM | POA: Diagnosis not present

## 2018-12-25 DIAGNOSIS — I5032 Chronic diastolic (congestive) heart failure: Secondary | ICD-10-CM | POA: Diagnosis not present

## 2018-12-25 DIAGNOSIS — M25511 Pain in right shoulder: Secondary | ICD-10-CM | POA: Diagnosis not present

## 2018-12-25 DIAGNOSIS — J449 Chronic obstructive pulmonary disease, unspecified: Secondary | ICD-10-CM | POA: Diagnosis not present

## 2018-12-26 DIAGNOSIS — Z20828 Contact with and (suspected) exposure to other viral communicable diseases: Secondary | ICD-10-CM | POA: Diagnosis not present

## 2019-01-01 DIAGNOSIS — F3132 Bipolar disorder, current episode depressed, moderate: Secondary | ICD-10-CM | POA: Diagnosis not present

## 2019-01-02 DIAGNOSIS — Z20828 Contact with and (suspected) exposure to other viral communicable diseases: Secondary | ICD-10-CM | POA: Diagnosis not present

## 2019-01-08 DIAGNOSIS — F3132 Bipolar disorder, current episode depressed, moderate: Secondary | ICD-10-CM | POA: Diagnosis not present

## 2019-01-12 DIAGNOSIS — D500 Iron deficiency anemia secondary to blood loss (chronic): Secondary | ICD-10-CM | POA: Diagnosis not present

## 2019-01-12 DIAGNOSIS — R600 Localized edema: Secondary | ICD-10-CM | POA: Diagnosis not present

## 2019-01-12 DIAGNOSIS — I509 Heart failure, unspecified: Secondary | ICD-10-CM | POA: Diagnosis not present

## 2019-01-12 DIAGNOSIS — F322 Major depressive disorder, single episode, severe without psychotic features: Secondary | ICD-10-CM | POA: Diagnosis not present

## 2019-01-15 DIAGNOSIS — F3132 Bipolar disorder, current episode depressed, moderate: Secondary | ICD-10-CM | POA: Diagnosis not present

## 2019-01-19 DIAGNOSIS — E785 Hyperlipidemia, unspecified: Secondary | ICD-10-CM | POA: Diagnosis not present

## 2019-01-19 DIAGNOSIS — E559 Vitamin D deficiency, unspecified: Secondary | ICD-10-CM | POA: Diagnosis not present

## 2019-01-19 DIAGNOSIS — I509 Heart failure, unspecified: Secondary | ICD-10-CM | POA: Diagnosis not present

## 2019-01-19 DIAGNOSIS — E039 Hypothyroidism, unspecified: Secondary | ICD-10-CM | POA: Diagnosis not present

## 2019-01-19 DIAGNOSIS — E119 Type 2 diabetes mellitus without complications: Secondary | ICD-10-CM | POA: Diagnosis not present

## 2019-01-19 DIAGNOSIS — D649 Anemia, unspecified: Secondary | ICD-10-CM | POA: Diagnosis not present

## 2019-01-19 DIAGNOSIS — Z79899 Other long term (current) drug therapy: Secondary | ICD-10-CM | POA: Diagnosis not present

## 2019-01-22 DIAGNOSIS — F3132 Bipolar disorder, current episode depressed, moderate: Secondary | ICD-10-CM | POA: Diagnosis not present

## 2019-01-24 DIAGNOSIS — Z20828 Contact with and (suspected) exposure to other viral communicable diseases: Secondary | ICD-10-CM | POA: Diagnosis not present

## 2019-01-25 DIAGNOSIS — F5105 Insomnia due to other mental disorder: Secondary | ICD-10-CM | POA: Diagnosis not present

## 2019-01-25 DIAGNOSIS — F3132 Bipolar disorder, current episode depressed, moderate: Secondary | ICD-10-CM | POA: Diagnosis not present

## 2019-01-28 LAB — PULMONARY FUNCTION TEST
DL/VA % pred: 76 %
DL/VA: 3.58 ml/min/mmHg/L
DLCO unc % pred: 61 %
DLCO unc: 21.63 ml/min/mmHg
FEF 25-75 Post: 1.98 L/sec
FEF 25-75 Pre: 1.02 L/sec
FEF2575-%Change-Post: 93 %
FEF2575-%Pred-Post: 87 %
FEF2575-%Pred-Pre: 45 %
FEV1-%Change-Post: 20 %
FEV1-%Pred-Post: 75 %
FEV1-%Pred-Pre: 62 %
FEV1-Post: 2.43 L
FEV1-Pre: 2.01 L
FEV1FVC-%Change-Post: 9 %
FEV1FVC-%Pred-Pre: 89 %
FEV6-%Change-Post: 11 %
FEV6-%Pred-Post: 80 %
FEV6-%Pred-Pre: 72 %
FEV6-Post: 3.36 L
FEV6-Pre: 3.03 L
FEV6FVC-%Change-Post: 0 %
FEV6FVC-%Pred-Post: 103 %
FEV6FVC-%Pred-Pre: 103 %
FVC-%Change-Post: 10 %
FVC-%Pred-Post: 77 %
FVC-%Pred-Pre: 70 %
FVC-Post: 3.45 L
Post FEV1/FVC ratio: 70 %
Post FEV6/FVC ratio: 98 %
Pre FEV1/FVC ratio: 64 %
Pre FEV6/FVC Ratio: 98 %
RV % pred: 116 %
RV: 3.18 L
TLC % pred: 81 %
TLC: 6.11 L

## 2019-01-29 DIAGNOSIS — Z20828 Contact with and (suspected) exposure to other viral communicable diseases: Secondary | ICD-10-CM | POA: Diagnosis not present

## 2019-02-05 DIAGNOSIS — Z20828 Contact with and (suspected) exposure to other viral communicable diseases: Secondary | ICD-10-CM | POA: Diagnosis not present

## 2019-02-12 DIAGNOSIS — Z20828 Contact with and (suspected) exposure to other viral communicable diseases: Secondary | ICD-10-CM | POA: Diagnosis not present

## 2019-02-16 DIAGNOSIS — F322 Major depressive disorder, single episode, severe without psychotic features: Secondary | ICD-10-CM | POA: Diagnosis not present

## 2019-02-16 DIAGNOSIS — D500 Iron deficiency anemia secondary to blood loss (chronic): Secondary | ICD-10-CM | POA: Diagnosis not present

## 2019-02-16 DIAGNOSIS — I509 Heart failure, unspecified: Secondary | ICD-10-CM | POA: Diagnosis not present

## 2019-02-16 DIAGNOSIS — R600 Localized edema: Secondary | ICD-10-CM | POA: Diagnosis not present

## 2019-02-18 DIAGNOSIS — Z20828 Contact with and (suspected) exposure to other viral communicable diseases: Secondary | ICD-10-CM | POA: Diagnosis not present

## 2019-02-21 DIAGNOSIS — I4819 Other persistent atrial fibrillation: Secondary | ICD-10-CM | POA: Diagnosis not present

## 2019-02-21 DIAGNOSIS — I5032 Chronic diastolic (congestive) heart failure: Secondary | ICD-10-CM | POA: Diagnosis not present

## 2019-02-21 DIAGNOSIS — E1140 Type 2 diabetes mellitus with diabetic neuropathy, unspecified: Secondary | ICD-10-CM | POA: Diagnosis not present

## 2019-02-21 DIAGNOSIS — M25511 Pain in right shoulder: Secondary | ICD-10-CM | POA: Diagnosis not present

## 2019-02-26 DIAGNOSIS — Z20828 Contact with and (suspected) exposure to other viral communicable diseases: Secondary | ICD-10-CM | POA: Diagnosis not present

## 2019-03-05 DIAGNOSIS — Z20828 Contact with and (suspected) exposure to other viral communicable diseases: Secondary | ICD-10-CM | POA: Diagnosis not present

## 2019-03-12 DIAGNOSIS — Z20828 Contact with and (suspected) exposure to other viral communicable diseases: Secondary | ICD-10-CM | POA: Diagnosis not present

## 2019-03-14 DIAGNOSIS — F3132 Bipolar disorder, current episode depressed, moderate: Secondary | ICD-10-CM | POA: Diagnosis not present

## 2019-03-16 DIAGNOSIS — F322 Major depressive disorder, single episode, severe without psychotic features: Secondary | ICD-10-CM | POA: Diagnosis not present

## 2019-03-16 DIAGNOSIS — I509 Heart failure, unspecified: Secondary | ICD-10-CM | POA: Diagnosis not present

## 2019-03-16 DIAGNOSIS — R600 Localized edema: Secondary | ICD-10-CM | POA: Diagnosis not present

## 2019-03-16 DIAGNOSIS — D500 Iron deficiency anemia secondary to blood loss (chronic): Secondary | ICD-10-CM | POA: Diagnosis not present

## 2019-03-20 DIAGNOSIS — I10 Essential (primary) hypertension: Secondary | ICD-10-CM | POA: Diagnosis not present

## 2019-03-20 DIAGNOSIS — D649 Anemia, unspecified: Secondary | ICD-10-CM | POA: Diagnosis not present

## 2019-03-21 DIAGNOSIS — F3132 Bipolar disorder, current episode depressed, moderate: Secondary | ICD-10-CM | POA: Diagnosis not present

## 2019-03-26 DIAGNOSIS — I4819 Other persistent atrial fibrillation: Secondary | ICD-10-CM | POA: Diagnosis not present

## 2019-03-26 DIAGNOSIS — M25511 Pain in right shoulder: Secondary | ICD-10-CM | POA: Diagnosis not present

## 2019-03-26 DIAGNOSIS — W19XXXD Unspecified fall, subsequent encounter: Secondary | ICD-10-CM | POA: Diagnosis not present

## 2019-03-26 DIAGNOSIS — I5032 Chronic diastolic (congestive) heart failure: Secondary | ICD-10-CM | POA: Diagnosis not present

## 2019-03-28 DIAGNOSIS — F3132 Bipolar disorder, current episode depressed, moderate: Secondary | ICD-10-CM | POA: Diagnosis not present

## 2019-03-29 DIAGNOSIS — Z20828 Contact with and (suspected) exposure to other viral communicable diseases: Secondary | ICD-10-CM | POA: Diagnosis not present

## 2019-03-30 DIAGNOSIS — E119 Type 2 diabetes mellitus without complications: Secondary | ICD-10-CM | POA: Diagnosis not present

## 2019-03-30 DIAGNOSIS — R0902 Hypoxemia: Secondary | ICD-10-CM | POA: Diagnosis not present

## 2019-03-30 DIAGNOSIS — R319 Hematuria, unspecified: Secondary | ICD-10-CM | POA: Diagnosis not present

## 2019-03-30 DIAGNOSIS — I4819 Other persistent atrial fibrillation: Secondary | ICD-10-CM | POA: Diagnosis not present

## 2019-03-30 DIAGNOSIS — I5032 Chronic diastolic (congestive) heart failure: Secondary | ICD-10-CM | POA: Diagnosis not present

## 2019-03-30 DIAGNOSIS — D649 Anemia, unspecified: Secondary | ICD-10-CM | POA: Diagnosis not present

## 2019-03-30 DIAGNOSIS — N390 Urinary tract infection, site not specified: Secondary | ICD-10-CM | POA: Diagnosis not present

## 2019-03-30 DIAGNOSIS — I10 Essential (primary) hypertension: Secondary | ICD-10-CM | POA: Diagnosis not present

## 2019-03-30 DIAGNOSIS — R4182 Altered mental status, unspecified: Secondary | ICD-10-CM | POA: Diagnosis not present

## 2019-03-31 DIAGNOSIS — N390 Urinary tract infection, site not specified: Secondary | ICD-10-CM | POA: Diagnosis not present

## 2019-03-31 DIAGNOSIS — R319 Hematuria, unspecified: Secondary | ICD-10-CM | POA: Diagnosis not present

## 2019-04-02 DIAGNOSIS — Z20828 Contact with and (suspected) exposure to other viral communicable diseases: Secondary | ICD-10-CM | POA: Diagnosis not present

## 2019-04-02 DIAGNOSIS — D649 Anemia, unspecified: Secondary | ICD-10-CM | POA: Diagnosis not present

## 2019-04-02 DIAGNOSIS — Z5189 Encounter for other specified aftercare: Secondary | ICD-10-CM | POA: Diagnosis not present

## 2019-04-05 DIAGNOSIS — I509 Heart failure, unspecified: Secondary | ICD-10-CM | POA: Diagnosis not present

## 2019-04-05 DIAGNOSIS — D649 Anemia, unspecified: Secondary | ICD-10-CM | POA: Diagnosis not present

## 2019-04-06 DIAGNOSIS — N183 Chronic kidney disease, stage 3 unspecified: Secondary | ICD-10-CM | POA: Diagnosis not present

## 2019-04-06 DIAGNOSIS — M1990 Unspecified osteoarthritis, unspecified site: Secondary | ICD-10-CM | POA: Diagnosis not present

## 2019-04-06 DIAGNOSIS — G8929 Other chronic pain: Secondary | ICD-10-CM | POA: Diagnosis not present

## 2019-04-06 DIAGNOSIS — I509 Heart failure, unspecified: Secondary | ICD-10-CM | POA: Diagnosis not present

## 2019-04-09 DIAGNOSIS — Z20828 Contact with and (suspected) exposure to other viral communicable diseases: Secondary | ICD-10-CM | POA: Diagnosis not present

## 2019-04-09 DIAGNOSIS — I10 Essential (primary) hypertension: Secondary | ICD-10-CM | POA: Diagnosis not present

## 2019-04-10 DIAGNOSIS — E119 Type 2 diabetes mellitus without complications: Secondary | ICD-10-CM | POA: Diagnosis not present

## 2019-04-10 DIAGNOSIS — F5105 Insomnia due to other mental disorder: Secondary | ICD-10-CM | POA: Diagnosis not present

## 2019-04-10 DIAGNOSIS — F3132 Bipolar disorder, current episode depressed, moderate: Secondary | ICD-10-CM | POA: Diagnosis not present

## 2019-04-10 DIAGNOSIS — I509 Heart failure, unspecified: Secondary | ICD-10-CM | POA: Diagnosis not present

## 2019-04-10 DIAGNOSIS — I10 Essential (primary) hypertension: Secondary | ICD-10-CM | POA: Diagnosis not present

## 2019-04-11 DIAGNOSIS — F3132 Bipolar disorder, current episode depressed, moderate: Secondary | ICD-10-CM | POA: Diagnosis not present

## 2019-04-13 DIAGNOSIS — G8929 Other chronic pain: Secondary | ICD-10-CM | POA: Diagnosis not present

## 2019-04-13 DIAGNOSIS — N183 Chronic kidney disease, stage 3 unspecified: Secondary | ICD-10-CM | POA: Diagnosis not present

## 2019-04-13 DIAGNOSIS — M1990 Unspecified osteoarthritis, unspecified site: Secondary | ICD-10-CM | POA: Diagnosis not present

## 2019-04-13 DIAGNOSIS — I509 Heart failure, unspecified: Secondary | ICD-10-CM | POA: Diagnosis not present

## 2019-04-16 DIAGNOSIS — E119 Type 2 diabetes mellitus without complications: Secondary | ICD-10-CM | POA: Diagnosis not present

## 2019-04-16 DIAGNOSIS — D649 Anemia, unspecified: Secondary | ICD-10-CM | POA: Diagnosis not present

## 2019-04-23 DIAGNOSIS — Z20828 Contact with and (suspected) exposure to other viral communicable diseases: Secondary | ICD-10-CM | POA: Diagnosis not present

## 2019-04-24 DIAGNOSIS — D649 Anemia, unspecified: Secondary | ICD-10-CM | POA: Diagnosis not present

## 2019-04-24 DIAGNOSIS — R69 Illness, unspecified: Secondary | ICD-10-CM | POA: Diagnosis not present

## 2019-05-03 DIAGNOSIS — F3132 Bipolar disorder, current episode depressed, moderate: Secondary | ICD-10-CM | POA: Diagnosis not present

## 2019-05-03 DIAGNOSIS — E119 Type 2 diabetes mellitus without complications: Secondary | ICD-10-CM | POA: Diagnosis not present

## 2019-05-03 DIAGNOSIS — D649 Anemia, unspecified: Secondary | ICD-10-CM | POA: Diagnosis not present

## 2019-05-15 DIAGNOSIS — F339 Major depressive disorder, recurrent, unspecified: Secondary | ICD-10-CM | POA: Diagnosis not present

## 2019-05-15 DIAGNOSIS — F3132 Bipolar disorder, current episode depressed, moderate: Secondary | ICD-10-CM | POA: Diagnosis not present

## 2019-05-15 DIAGNOSIS — F319 Bipolar disorder, unspecified: Secondary | ICD-10-CM | POA: Diagnosis not present

## 2019-05-15 DIAGNOSIS — F5105 Insomnia due to other mental disorder: Secondary | ICD-10-CM | POA: Diagnosis not present

## 2019-05-18 DIAGNOSIS — G8929 Other chronic pain: Secondary | ICD-10-CM | POA: Diagnosis not present

## 2019-05-18 DIAGNOSIS — Z20828 Contact with and (suspected) exposure to other viral communicable diseases: Secondary | ICD-10-CM | POA: Diagnosis not present

## 2019-05-18 DIAGNOSIS — N183 Chronic kidney disease, stage 3 unspecified: Secondary | ICD-10-CM | POA: Diagnosis not present

## 2019-05-18 DIAGNOSIS — M1990 Unspecified osteoarthritis, unspecified site: Secondary | ICD-10-CM | POA: Diagnosis not present

## 2019-05-18 DIAGNOSIS — I509 Heart failure, unspecified: Secondary | ICD-10-CM | POA: Diagnosis not present

## 2019-05-23 DIAGNOSIS — Z20828 Contact with and (suspected) exposure to other viral communicable diseases: Secondary | ICD-10-CM | POA: Diagnosis not present

## 2019-05-24 DIAGNOSIS — G4733 Obstructive sleep apnea (adult) (pediatric): Secondary | ICD-10-CM | POA: Diagnosis not present

## 2019-05-24 DIAGNOSIS — I5032 Chronic diastolic (congestive) heart failure: Secondary | ICD-10-CM | POA: Diagnosis not present

## 2019-05-24 DIAGNOSIS — F319 Bipolar disorder, unspecified: Secondary | ICD-10-CM | POA: Diagnosis not present

## 2019-05-24 DIAGNOSIS — R05 Cough: Secondary | ICD-10-CM | POA: Diagnosis not present

## 2019-05-24 DIAGNOSIS — I4819 Other persistent atrial fibrillation: Secondary | ICD-10-CM | POA: Diagnosis not present

## 2019-05-24 DIAGNOSIS — J449 Chronic obstructive pulmonary disease, unspecified: Secondary | ICD-10-CM | POA: Diagnosis not present

## 2019-05-26 DIAGNOSIS — F3132 Bipolar disorder, current episode depressed, moderate: Secondary | ICD-10-CM | POA: Diagnosis not present

## 2019-05-29 DIAGNOSIS — Z20828 Contact with and (suspected) exposure to other viral communicable diseases: Secondary | ICD-10-CM | POA: Diagnosis not present

## 2019-06-04 DIAGNOSIS — Z20828 Contact with and (suspected) exposure to other viral communicable diseases: Secondary | ICD-10-CM | POA: Diagnosis not present

## 2019-06-05 DIAGNOSIS — F319 Bipolar disorder, unspecified: Secondary | ICD-10-CM | POA: Diagnosis not present

## 2019-06-05 DIAGNOSIS — F3132 Bipolar disorder, current episode depressed, moderate: Secondary | ICD-10-CM | POA: Diagnosis not present

## 2019-06-05 DIAGNOSIS — F339 Major depressive disorder, recurrent, unspecified: Secondary | ICD-10-CM | POA: Diagnosis not present

## 2019-06-05 DIAGNOSIS — F5105 Insomnia due to other mental disorder: Secondary | ICD-10-CM | POA: Diagnosis not present

## 2019-06-08 DIAGNOSIS — J449 Chronic obstructive pulmonary disease, unspecified: Secondary | ICD-10-CM | POA: Diagnosis not present

## 2019-06-08 DIAGNOSIS — G4733 Obstructive sleep apnea (adult) (pediatric): Secondary | ICD-10-CM | POA: Diagnosis not present

## 2019-06-12 DIAGNOSIS — F3132 Bipolar disorder, current episode depressed, moderate: Secondary | ICD-10-CM | POA: Diagnosis not present

## 2019-06-12 DIAGNOSIS — Z20828 Contact with and (suspected) exposure to other viral communicable diseases: Secondary | ICD-10-CM | POA: Diagnosis not present

## 2019-06-12 DIAGNOSIS — F5105 Insomnia due to other mental disorder: Secondary | ICD-10-CM | POA: Diagnosis not present

## 2019-06-12 DIAGNOSIS — F319 Bipolar disorder, unspecified: Secondary | ICD-10-CM | POA: Diagnosis not present

## 2019-06-12 DIAGNOSIS — F339 Major depressive disorder, recurrent, unspecified: Secondary | ICD-10-CM | POA: Diagnosis not present

## 2019-06-15 DIAGNOSIS — N183 Chronic kidney disease, stage 3 unspecified: Secondary | ICD-10-CM | POA: Diagnosis not present

## 2019-06-15 DIAGNOSIS — G8929 Other chronic pain: Secondary | ICD-10-CM | POA: Diagnosis not present

## 2019-06-15 DIAGNOSIS — M1990 Unspecified osteoarthritis, unspecified site: Secondary | ICD-10-CM | POA: Diagnosis not present

## 2019-06-15 DIAGNOSIS — I509 Heart failure, unspecified: Secondary | ICD-10-CM | POA: Diagnosis not present

## 2019-06-19 DIAGNOSIS — F3132 Bipolar disorder, current episode depressed, moderate: Secondary | ICD-10-CM | POA: Diagnosis not present

## 2019-06-19 DIAGNOSIS — F5105 Insomnia due to other mental disorder: Secondary | ICD-10-CM | POA: Diagnosis not present

## 2019-06-19 DIAGNOSIS — F339 Major depressive disorder, recurrent, unspecified: Secondary | ICD-10-CM | POA: Diagnosis not present

## 2019-06-19 DIAGNOSIS — F319 Bipolar disorder, unspecified: Secondary | ICD-10-CM | POA: Diagnosis not present

## 2019-06-25 DIAGNOSIS — S91104A Unspecified open wound of right lesser toe(s) without damage to nail, initial encounter: Secondary | ICD-10-CM | POA: Diagnosis not present

## 2019-06-25 DIAGNOSIS — Q845 Enlarged and hypertrophic nails: Secondary | ICD-10-CM | POA: Diagnosis not present

## 2019-06-25 DIAGNOSIS — L603 Nail dystrophy: Secondary | ICD-10-CM | POA: Diagnosis not present

## 2019-06-25 DIAGNOSIS — E119 Type 2 diabetes mellitus without complications: Secondary | ICD-10-CM | POA: Diagnosis not present

## 2019-06-25 DIAGNOSIS — I739 Peripheral vascular disease, unspecified: Secondary | ICD-10-CM | POA: Diagnosis not present

## 2019-06-25 DIAGNOSIS — B351 Tinea unguium: Secondary | ICD-10-CM | POA: Diagnosis not present

## 2019-06-27 DIAGNOSIS — E1140 Type 2 diabetes mellitus with diabetic neuropathy, unspecified: Secondary | ICD-10-CM | POA: Diagnosis not present

## 2019-06-27 DIAGNOSIS — L03116 Cellulitis of left lower limb: Secondary | ICD-10-CM | POA: Diagnosis not present

## 2019-06-27 DIAGNOSIS — I872 Venous insufficiency (chronic) (peripheral): Secondary | ICD-10-CM | POA: Diagnosis not present

## 2019-06-27 DIAGNOSIS — I5033 Acute on chronic diastolic (congestive) heart failure: Secondary | ICD-10-CM | POA: Diagnosis not present

## 2019-06-28 DIAGNOSIS — F319 Bipolar disorder, unspecified: Secondary | ICD-10-CM | POA: Diagnosis not present

## 2019-06-28 DIAGNOSIS — I4819 Other persistent atrial fibrillation: Secondary | ICD-10-CM | POA: Diagnosis not present

## 2019-06-28 DIAGNOSIS — G4733 Obstructive sleep apnea (adult) (pediatric): Secondary | ICD-10-CM | POA: Diagnosis not present

## 2019-06-28 DIAGNOSIS — I872 Venous insufficiency (chronic) (peripheral): Secondary | ICD-10-CM | POA: Diagnosis not present

## 2019-06-28 DIAGNOSIS — F3132 Bipolar disorder, current episode depressed, moderate: Secondary | ICD-10-CM | POA: Diagnosis not present

## 2019-06-28 DIAGNOSIS — R251 Tremor, unspecified: Secondary | ICD-10-CM | POA: Diagnosis not present

## 2019-06-28 DIAGNOSIS — I5032 Chronic diastolic (congestive) heart failure: Secondary | ICD-10-CM | POA: Diagnosis not present

## 2019-07-02 DIAGNOSIS — R7989 Other specified abnormal findings of blood chemistry: Secondary | ICD-10-CM | POA: Diagnosis not present

## 2019-07-02 DIAGNOSIS — R6889 Other general symptoms and signs: Secondary | ICD-10-CM | POA: Diagnosis not present

## 2019-07-02 DIAGNOSIS — D649 Anemia, unspecified: Secondary | ICD-10-CM | POA: Diagnosis not present

## 2019-07-07 DIAGNOSIS — Z03818 Encounter for observation for suspected exposure to other biological agents ruled out: Secondary | ICD-10-CM | POA: Diagnosis not present

## 2019-07-08 DIAGNOSIS — I5032 Chronic diastolic (congestive) heart failure: Secondary | ICD-10-CM | POA: Diagnosis not present

## 2019-07-08 DIAGNOSIS — J449 Chronic obstructive pulmonary disease, unspecified: Secondary | ICD-10-CM | POA: Diagnosis not present

## 2019-07-08 DIAGNOSIS — F319 Bipolar disorder, unspecified: Secondary | ICD-10-CM | POA: Diagnosis not present

## 2019-07-08 DIAGNOSIS — G4733 Obstructive sleep apnea (adult) (pediatric): Secondary | ICD-10-CM | POA: Diagnosis not present

## 2019-07-08 DIAGNOSIS — I4819 Other persistent atrial fibrillation: Secondary | ICD-10-CM | POA: Diagnosis not present

## 2019-07-08 DIAGNOSIS — I872 Venous insufficiency (chronic) (peripheral): Secondary | ICD-10-CM | POA: Diagnosis not present

## 2019-07-10 DIAGNOSIS — F319 Bipolar disorder, unspecified: Secondary | ICD-10-CM | POA: Diagnosis not present

## 2019-07-10 DIAGNOSIS — F3132 Bipolar disorder, current episode depressed, moderate: Secondary | ICD-10-CM | POA: Diagnosis not present

## 2019-07-10 DIAGNOSIS — F5105 Insomnia due to other mental disorder: Secondary | ICD-10-CM | POA: Diagnosis not present

## 2019-07-10 DIAGNOSIS — F339 Major depressive disorder, recurrent, unspecified: Secondary | ICD-10-CM | POA: Diagnosis not present

## 2019-07-11 DIAGNOSIS — Z20828 Contact with and (suspected) exposure to other viral communicable diseases: Secondary | ICD-10-CM | POA: Diagnosis not present

## 2019-07-13 ENCOUNTER — Other Ambulatory Visit: Payer: Self-pay | Admitting: *Deleted

## 2019-07-13 DIAGNOSIS — M25561 Pain in right knee: Secondary | ICD-10-CM

## 2019-07-13 DIAGNOSIS — M7989 Other specified soft tissue disorders: Secondary | ICD-10-CM

## 2019-07-16 DIAGNOSIS — Z03818 Encounter for observation for suspected exposure to other biological agents ruled out: Secondary | ICD-10-CM | POA: Diagnosis not present

## 2019-07-17 ENCOUNTER — Ambulatory Visit (HOSPITAL_COMMUNITY)
Admission: RE | Admit: 2019-07-17 | Discharge: 2019-07-17 | Disposition: A | Discharge status: Home / Self Care | Payer: Medicare HMO | Source: Ambulatory Visit | Attending: Vascular Surgery | Admitting: Vascular Surgery

## 2019-07-17 ENCOUNTER — Other Ambulatory Visit: Payer: Self-pay

## 2019-07-17 ENCOUNTER — Encounter: Payer: Self-pay | Admitting: Vascular Surgery

## 2019-07-17 ENCOUNTER — Ambulatory Visit (INDEPENDENT_AMBULATORY_CARE_PROVIDER_SITE_OTHER): Payer: Medicare HMO | Admitting: Vascular Surgery

## 2019-07-17 VITALS — BP 120/60 | HR 94 | Temp 97.3°F | Resp 18 | Ht 72.0 in | Wt 210.0 lb

## 2019-07-17 DIAGNOSIS — M7989 Other specified soft tissue disorders: Secondary | ICD-10-CM | POA: Diagnosis not present

## 2019-07-17 DIAGNOSIS — M25562 Pain in left knee: Secondary | ICD-10-CM | POA: Diagnosis not present

## 2019-07-17 DIAGNOSIS — S81801A Unspecified open wound, right lower leg, initial encounter: Secondary | ICD-10-CM | POA: Diagnosis not present

## 2019-07-17 DIAGNOSIS — M25561 Pain in right knee: Secondary | ICD-10-CM | POA: Diagnosis not present

## 2019-07-17 DIAGNOSIS — S81802A Unspecified open wound, left lower leg, initial encounter: Secondary | ICD-10-CM | POA: Diagnosis not present

## 2019-07-17 NOTE — Progress Notes (Signed)
Patient name: Joel [name] MRN: [id number] DOB: 1932/02/03 Sex: male  REASON FOR CONSULT: Evaluate bilateral lower extremity venous stasis ulcers  HPI: Joel [name] is a 86 y.o. male, with history of CHF, COPD, diabetes, hypertension that presents from [facility] for evaluation of bilateral lower extremity venous ulcers.  Patient is a bit of a poor historian but sounds like he has had a prolonged recovery after a fall.  He states has been in [facility] since December of this year.  States he is nonambulatory and is in a wheelchair.  Unclear about the exact duration of his lower extremity edema.  He does state that he is on fluid pills double the dose that has been helping significantly.  He denies any previous interventions.  States his main concern is losing his legs.  No pain in the feet.  No distal ulcerations.  Past Medical History:  Diagnosis Date  . Allergic rhinitis   . Anemia   . Anxiety   . Aortic stenosis 2012   mild to mod   . Atrial fibrillation ([place]) 1991   with multiple DCCV  . Bipolar affective disorder ([place])   . Cancer of ascending colon ([place]) 09/05/2017  . CHF (congestive heart failure) ([place]) 08/30/2017  . COPD (chronic obstructive pulmonary disease) ([place])   . Diabetic neuropathy ([place])    in feet  . Diabetic neuropathy ([place])   . Dyslipidemia   . Fatigue    last year or so  . Gout   . Hypertension   . Insomnia   . Obesity   . On home oxygen therapy 2 1/2 liters with bipap at night  . OSA (obstructive sleep apnea)    severe, uses bipap 16 1/2 by 12 or 13 setting  . Prostate cancer ([place])   . Rectal bleeding 12/16/2015  . Type 2 diabetes mellitus ([place])   . Vertigo     Past Surgical History:  Procedure Laterality Date  . BIOPSY  09/02/2017   Procedure: BIOPSY;  Surgeon: Ronald Lobo, MD;  Location: [facility];  Service: Endoscopy;;  . CARDIOVERSION  yrs ago   prior to 1998  . COLON RESECTION N/A 09/05/2017   Procedure:  LAPAROSCOPIC RIGHT COLON RESECTION;  Surgeon: Fanny Skates, MD;  Location: [facility];  Service: General;  Laterality: N/A;  . COLONOSCOPY N/A 09/02/2017   Procedure: COLONOSCOPY;  Surgeon: Ronald Lobo, MD;  Location: [facility];  Service: Endoscopy;  Laterality: N/A;  . COLONOSCOPY WITH PROPOFOL N/A 01/23/2013   Procedure: COLONOSCOPY WITH PROPOFOL;  Surgeon: Garlan Fair, MD;  Location: [facility];  Service: Endoscopy;  Laterality: N/A;  . electro shock  1969   for depression  . ENTEROSCOPY Left 05/17/2016   Procedure: ENTEROSCOPY;  Surgeon: Wilford Corner, MD;  Location: [facility];  Service: Endoscopy;  Laterality: Left;  . ESOPHAGOGASTRODUODENOSCOPY N/A 03/03/2016   Procedure: ESOPHAGOGASTRODUODENOSCOPY (EGD);  Surgeon: Teena Irani, MD;  Location: [facility];  Service: Endoscopy;  Laterality: N/A;  . ESOPHAGOGASTRODUODENOSCOPY N/A 03/26/2016   Procedure: ESOPHAGOGASTRODUODENOSCOPY (EGD);  Surgeon: Teena Irani, MD;  Location[facility];  Service: Endoscopy;  Laterality: N/A;  would like to use ultraslim scope  . ESOPHAGOGASTRODUODENOSCOPY N/A 08/31/2017   Procedure: ESOPHAGOGASTRODUODENOSCOPY (EGD);  Surgeon: Laurence Spates, MD;  Location: [facility];  Service: Endoscopy;  Laterality: N/A;  . ESOPHAGOGASTRODUODENOSCOPY (EGD) WITH PROPOFOL N/A 01/23/2013   Procedure: ESOPHAGOGASTRODUODENOSCOPY (EGD) WITH PROPOFOL;  Surgeon: Garlan Fair, MD;  [facility];  Service: Endoscopy;  Laterality: N/A;  .  ESOPHAGOGASTRODUODENOSCOPY (EGD) WITH PROPOFOL N/A 04/13/2016   Procedure: ESOPHAGOGASTRODUODENOSCOPY (EGD) WITH PROPOFOL;  Surgeon: Otis Brace, MD;  Location: [facility];  Service: Gastroenterology;  Laterality: N/A;  . GIVENS CAPSULE STUDY N/A 03/03/2016   Procedure: GIVENS CAPSULE STUDY;  Surgeon: Teena Irani, MD;  [facility];  Service: Endoscopy;  Laterality: N/A;  . HEMORRHOID SURGERY N/A 12/16/2015   Procedure: PROCTOSCOPY WITH CONTROL OF BLEEDING;   Surgeon: Fanny Skates, MD;  Location: [facility];  Service: General;  Laterality: N/A;  . HIP PINNING,CANNULATED Right 03/23/2017   Procedure: CANNULATED HIP PINNING;  Surgeon: Marybelle Killings, MD;  Location: [facility];  Service: Orthopedics;  Laterality: Right;  . KNEE SURGERY  1970's   rt  . LAPAROTOMY N/A 09/10/2017   Procedure: EXPLORATORY LAPAROTOMY, PARTIAL COLECTOMY;  Surgeon: Excell Seltzer, MD;  Location: [facility];  Service: General;  Laterality: N/A;  . [place]  . POLYPECTOMY  09/02/2017   Procedure: POLYPECTOMY;  Surgeon: Ronald Lobo, MD;  [facility];  Service: Endoscopy;;  . PROSTATE BIOPSY    . SHOULDER SURGERY  7-8 yrs ago   rt    Family History  Problem Relation Age of Onset  . Tuberculosis Maternal Grandmother   . Heart attack Neg Hx   . Diabetes Neg Hx   . CAD Neg Hx   . Cancer Neg Hx     SOCIAL HISTORY: Social History   Socioeconomic History  . Marital status: Widowed    Spouse name: Not on file  . Number of children: Not on file  . Years of education: Not on file  . Highest education level: Not on file  Occupational History  . Occupation: Surveyor  Tobacco Use  . Smoking status: Former Smoker    Packs/day: 2.00    Years: 25.00    Pack years: 50.00    Types: Cigarettes    Quit date: 07/08/1976    Years since quitting: 43.0  . Smokeless tobacco: Never Used  Vaping Use  . Vaping Use: Never used  Substance and Sexual Activity  . Alcohol use: No    Comment: in AA since 1977, no alcohol since 1977  . Drug use: No    Types: Marijuana    Comment: marijuana use 37 years ago  . Sexual activity: Yes  Other Topics Concern  . Not on file  Social History Narrative  . Not on file   Social Determinants of Health   Financial Resource Strain:   . Difficulty of Paying Living Expenses:   Food Insecurity:   . Worried About Charity fundraiser in the Last Year:   . Arboriculturist in the Last Year:   Transportation Needs:   . Lexicographer (Medical):   [place] Lack of Transportation (Non-Medical):   Physical Activity:   . Days of Exercise per Week:   . Minutes of Exercise per Session:   Stress:   . Feeling of Stress :   Social Connections:   . Frequency of Communication with Friends and Family:   . Frequency of Social Gatherings with Friends and Family:   . Attends Religious Services:   . Active Member of Clubs or Organizations:   . Attends Archivist Meetings:   Mar[place] Kitchen Marital Status:   Intimate Partner Violence:   . Fear of Current or Ex-Partner:   . Emotionally Abused:   [place] Physically Abused:   . Sexually Abused:     Allergies  Allergen Reactions  . Penicillins  Anaphylaxis    Has patient had a PCN reaction causing immediate rash, facial/tongue/throat swelling, SOB or lightheadedness with hypotension: unknown Has patient had a PCN reaction causing severe rash involving mucus membranes or skin necrosis: unknown Has patient had a PCN reaction that required hospitalization: unknown Has patient had a PCN reaction occurring within the last 10 years: no If all of the above answers are "NO", then may proceed with Cephalosporin use.   Mar[place] Kitchen Antihistamines, Loratadine-Type Other (See Comments)    depression  . Other     Beta blockers-Novacaine  Caused depression    Current Outpatient Medications  Medication Sig Dispense Refill  . allopurinol (ZYLOPRIM) 100 MG tablet Take 100 mg by mouth daily.     . Ascorbic Acid (VITAMIN C PO) Take 1,000 mg by mouth daily.     Mar[place] Kitchen buPROPion (WELLBUTRIN XL) 150 MG 24 hr tablet Take 1 tablet (150 mg total) by mouth daily. 30 tablet 1  . citalopram (CELEXA) 20 MG tablet Take 1 tablet (20 mg total) by mouth daily. 30 tablet 0  . diclofenac sodium (VOLTAREN) 1 % GEL Apply 1 application topically 4 (four) times daily. Pt uses once daily    . diltiazem (CARDIZEM CD) 120 MG 24 hr capsule Take 1 capsule (120 mg total) by mouth daily. 60 capsule 0  . fluticasone (FLONASE) 50 MCG/ACT  nasal spray Place 2 sprays into both nostrils daily. (Patient taking differently: Place 2 sprays into both nostrils daily as needed for allergies or rhinitis. ) 16 g 1  . furosemide (LASIX) 80 MG tablet Take 1 tablet (80 mg total) by mouth daily. (Patient taking differently: Take 40 mg by mouth daily. ) 30 tablet 0  . gabapentin (NEURONTIN) 100 MG capsule Take 2 capsules (200 mg total) by mouth 2 (two) times daily. 60 capsule 0  . glimepiride (AMARYL) 1 MG tablet Take 1 mg by mouth daily with breakfast.   0  . guaifenesin (HUMIBID E) 400 MG TABS tablet Take 400 mg by mouth every 12 (twelve) hours as needed (for congestion, OTC).    Marland Kitchen lovastatin (MEVACOR) 20 MG tablet Take 20 mg by mouth at bedtime.     . meclizine (ANTIVERT) 25 MG tablet Take 1 tablet (25 mg total) by mouth 3 (three) times daily as needed for dizziness. (Patient taking differently: Take 25 mg by mouth at bedtime. ) 30 tablet 0  . Multiple Vitamins-Minerals (VISION FORMULA PO) Take 1 tablet by mouth daily.     . OXYGEN Inhale 2 L into the lungs as needed (shortness of breath).     . pantoprazole (PROTONIX) 40 MG tablet Take 1 tablet (40 mg total) by mouth 2 (two) times daily. 60 tablet 0  . potassium chloride SA (K-DUR,KLOR-CON) 20 MEQ tablet Take 20 mEq by mouth daily.   0  . sodium chloride (OCEAN) 0.65 % SOLN nasal spray Place 1 spray into both nostrils as needed for congestion.    . temazepam (RESTORIL) 15 MG capsule Take 1 capsule (15 mg total) by mouth at bedtime. 30 capsule 0   No current facility-administered medications for this visit.    REVIEW OF SYSTEMS:  [X]  denotes positive finding, [ ]  denotes negative finding Cardiac  Comments:  Chest pain or chest pressure:    Shortness of breath upon exertion:    Short of breath when lying flat:    Irregular heart rhythm:        Vascular    Pain in calf, thigh, or hip brought  on by ambulation:    Pain in feet at night that wakes you up from your sleep:     Blood clot in  your veins:    Leg swelling:  x       Pulmonary    Oxygen at home:    Productive cough:     Wheezing:         Neurologic    Sudden weakness in arms or legs:     Sudden numbness in arms or legs:     Sudden onset of difficulty speaking or slurred speech:    Temporary loss of vision in one eye:     Problems with dizziness:         Gastrointestinal    Blood in stool:     Vomited blood:         Genitourinary    Burning when urinating:     Blood in urine:        Psychiatric    Major depression:         Hematologic    Bleeding problems:    Problems with blood clotting too easily:        Skin    Rashes or ulcers:        Constitutional    Fever or chills:      PHYSICAL EXAM: Vitals:   07/17/19 1501  BP: 120/60  Pulse: 94  Resp: 18  Temp: (!) 97.3 F (36.3 C)  TempSrc: Temporal  SpO2: 97%  Weight: 210 lb (95.3 kg)  Height: 6' (1.829 m)    GENERAL: The patient is a well-nourished male, in no acute distress. The vital signs are documented above. CARDIAC: There is a regular rate and rhythm.  VASCULAR:  Palpable femoral pulses bilaterally Right Dp/PT signals Left DP/PT signals No distal ulcerations or gangrene PULMONARY: There is good air exchange bilaterally without wheezing or rales. ABDOMEN: Soft and non-tender with normal pitched bowel sounds.  MUSCULOSKELETAL: There are no major deformities or cyanosis. NEUROLOGIC: No focal weakness or paresthesias are detected. SKIN: There are no ulcers or rashes noted. PSYCHIATRIC: The patient has a normal affect.      DATA:   I reviewed his venous reflux study and there is no evidence of lower extremity venous insufficiency  Assessment/Plan:  86 year old male presents for evaluation of bilateral lower extremity venous stasis ulcers.  Discussed with the patient that based on his reflux study today he has no evidence of venous reflux/venous insufficiency.  I am not certain if a component of this could be lymphedema  but I would encourage just knee-high compression that he is already wearing with leg elevation and conservative measures.  This may also be a component of his CHF given pulsatile venous doppler signals.  I do not see any role for surgical intervention.  He has no distal arterial ulcerations on exam and is not having rest pain in his feet at night that would warrant any type of arterial intervention.  He can follow-up as needed.   Marty Heck, MD [facility] Office: [phone]

## 2019-07-18 DIAGNOSIS — I5032 Chronic diastolic (congestive) heart failure: Secondary | ICD-10-CM | POA: Diagnosis not present

## 2019-07-18 DIAGNOSIS — L89612 Pressure ulcer of right heel, stage 2: Secondary | ICD-10-CM | POA: Diagnosis not present

## 2019-07-18 DIAGNOSIS — I872 Venous insufficiency (chronic) (peripheral): Secondary | ICD-10-CM | POA: Diagnosis not present

## 2019-07-18 DIAGNOSIS — L03115 Cellulitis of right lower limb: Secondary | ICD-10-CM | POA: Diagnosis not present

## 2019-07-18 DIAGNOSIS — E1140 Type 2 diabetes mellitus with diabetic neuropathy, unspecified: Secondary | ICD-10-CM | POA: Diagnosis not present

## 2019-07-18 DIAGNOSIS — F319 Bipolar disorder, unspecified: Secondary | ICD-10-CM | POA: Diagnosis not present

## 2019-07-20 DIAGNOSIS — M1990 Unspecified osteoarthritis, unspecified site: Secondary | ICD-10-CM | POA: Diagnosis not present

## 2019-07-20 DIAGNOSIS — W19XXXA Unspecified fall, initial encounter: Secondary | ICD-10-CM | POA: Diagnosis not present

## 2019-07-20 DIAGNOSIS — I509 Heart failure, unspecified: Secondary | ICD-10-CM | POA: Diagnosis not present

## 2019-07-20 DIAGNOSIS — N183 Chronic kidney disease, stage 3 unspecified: Secondary | ICD-10-CM | POA: Diagnosis not present

## 2019-07-20 DIAGNOSIS — G8929 Other chronic pain: Secondary | ICD-10-CM | POA: Diagnosis not present

## 2019-07-20 DIAGNOSIS — R42 Dizziness and giddiness: Secondary | ICD-10-CM | POA: Diagnosis not present

## 2019-07-20 DIAGNOSIS — R52 Pain, unspecified: Secondary | ICD-10-CM | POA: Diagnosis not present

## 2019-07-20 DIAGNOSIS — R0902 Hypoxemia: Secondary | ICD-10-CM | POA: Diagnosis not present

## 2019-07-21 ENCOUNTER — Emergency Department (HOSPITAL_COMMUNITY)
Admission: EM | Admit: 2019-07-21 | Discharge: 2019-07-21 | Disposition: A | Discharge status: Home / Self Care | Payer: Medicare HMO | Attending: Emergency Medicine | Admitting: Emergency Medicine

## 2019-07-21 ENCOUNTER — Emergency Department (HOSPITAL_COMMUNITY): Payer: Medicare HMO

## 2019-07-21 DIAGNOSIS — T07XXXA Unspecified multiple injuries, initial encounter: Secondary | ICD-10-CM | POA: Diagnosis present

## 2019-07-21 DIAGNOSIS — J449 Chronic obstructive pulmonary disease, unspecified: Secondary | ICD-10-CM | POA: Insufficient documentation

## 2019-07-21 DIAGNOSIS — W19XXXA Unspecified fall, initial encounter: Secondary | ICD-10-CM

## 2019-07-21 DIAGNOSIS — S76019A Strain of muscle, fascia and tendon of unspecified hip, initial encounter: Secondary | ICD-10-CM

## 2019-07-21 DIAGNOSIS — E119 Type 2 diabetes mellitus without complications: Secondary | ICD-10-CM | POA: Insufficient documentation

## 2019-07-21 DIAGNOSIS — S46911A Strain of unspecified muscle, fascia and tendon at shoulder and upper arm level, right arm, initial encounter: Secondary | ICD-10-CM | POA: Insufficient documentation

## 2019-07-21 DIAGNOSIS — Y999 Unspecified external cause status: Secondary | ICD-10-CM | POA: Insufficient documentation

## 2019-07-21 DIAGNOSIS — Z87891 Personal history of nicotine dependence: Secondary | ICD-10-CM | POA: Insufficient documentation

## 2019-07-21 DIAGNOSIS — S76012A Strain of muscle, fascia and tendon of left hip, initial encounter: Secondary | ICD-10-CM | POA: Insufficient documentation

## 2019-07-21 DIAGNOSIS — S8391XA Sprain of unspecified site of right knee, initial encounter: Secondary | ICD-10-CM | POA: Insufficient documentation

## 2019-07-21 DIAGNOSIS — I509 Heart failure, unspecified: Secondary | ICD-10-CM | POA: Diagnosis not present

## 2019-07-21 DIAGNOSIS — I10 Essential (primary) hypertension: Secondary | ICD-10-CM | POA: Diagnosis not present

## 2019-07-21 DIAGNOSIS — Z7401 Bed confinement status: Secondary | ICD-10-CM | POA: Diagnosis not present

## 2019-07-21 DIAGNOSIS — Y93E8 Activity, other personal hygiene: Secondary | ICD-10-CM | POA: Diagnosis not present

## 2019-07-21 DIAGNOSIS — I110 Hypertensive heart disease with heart failure: Secondary | ICD-10-CM | POA: Insufficient documentation

## 2019-07-21 DIAGNOSIS — M25561 Pain in right knee: Secondary | ICD-10-CM | POA: Diagnosis not present

## 2019-07-21 DIAGNOSIS — S76011A Strain of muscle, fascia and tendon of right hip, initial encounter: Secondary | ICD-10-CM | POA: Insufficient documentation

## 2019-07-21 DIAGNOSIS — M2550 Pain in unspecified joint: Secondary | ICD-10-CM | POA: Diagnosis not present

## 2019-07-21 DIAGNOSIS — Z79899 Other long term (current) drug therapy: Secondary | ICD-10-CM | POA: Insufficient documentation

## 2019-07-21 DIAGNOSIS — I4891 Unspecified atrial fibrillation: Secondary | ICD-10-CM | POA: Diagnosis not present

## 2019-07-21 DIAGNOSIS — Y92121 Bathroom in nursing home as the place of occurrence of the external cause: Secondary | ICD-10-CM | POA: Insufficient documentation

## 2019-07-21 DIAGNOSIS — W1830XA Fall on same level, unspecified, initial encounter: Secondary | ICD-10-CM | POA: Insufficient documentation

## 2019-07-21 DIAGNOSIS — R42 Dizziness and giddiness: Secondary | ICD-10-CM | POA: Diagnosis not present

## 2019-07-21 DIAGNOSIS — S79911A Unspecified injury of right hip, initial encounter: Secondary | ICD-10-CM | POA: Diagnosis not present

## 2019-07-21 DIAGNOSIS — M25511 Pain in right shoulder: Secondary | ICD-10-CM | POA: Diagnosis not present

## 2019-07-21 DIAGNOSIS — S79912A Unspecified injury of left hip, initial encounter: Secondary | ICD-10-CM | POA: Diagnosis not present

## 2019-07-21 LAB — CBC WITH DIFFERENTIAL/PLATELET
Abs Immature Granulocytes: 0.01 10*3/uL (ref 0.00–0.07)
Basophils Absolute: 0 10*3/uL (ref 0.0–0.1)
Basophils Relative: 1 %
Eosinophils Absolute: 0.3 10*3/uL (ref 0.0–0.5)
Eosinophils Relative: 6 %
HCT: 29.9 % — ABNORMAL LOW (ref 39.0–52.0)
Hemoglobin: 8.7 g/dL — ABNORMAL LOW (ref 13.0–17.0)
Immature Granulocytes: 0 %
Lymphocytes Relative: 10 %
Lymphs Abs: 0.5 10*3/uL — ABNORMAL LOW (ref 0.7–4.0)
MCH: 28.6 pg (ref 26.0–34.0)
MCHC: 29.1 g/dL — ABNORMAL LOW (ref 30.0–36.0)
MCV: 98.4 fL (ref 80.0–100.0)
Monocytes Absolute: 0.6 10*3/uL (ref 0.1–1.0)
Monocytes Relative: 11 %
Neutro Abs: 3.7 10*3/uL (ref 1.7–7.7)
Neutrophils Relative %: 72 %
Platelets: 188 10*3/uL (ref 150–400)
RBC: 3.04 MIL/uL — ABNORMAL LOW (ref 4.22–5.81)
RDW: 15.8 % — ABNORMAL HIGH (ref 11.5–15.5)
WBC: 5.2 10*3/uL (ref 4.0–10.5)
nRBC: 0 % (ref 0.0–0.2)

## 2019-07-21 LAB — BASIC METABOLIC PANEL
Anion gap: 15 (ref 5–15)
BUN: 30 mg/dL — ABNORMAL HIGH (ref 8–23)
CO2: 26 mmol/L (ref 22–32)
Calcium: 9.1 mg/dL (ref 8.9–10.3)
Chloride: 107 mmol/L (ref 98–111)
Creatinine, Ser: 1.59 mg/dL — ABNORMAL HIGH (ref 0.61–1.24)
GFR calc Af Amer: 46 mL/min — ABNORMAL LOW (ref >60)
GFR calc non Af Amer: 39 mL/min — ABNORMAL LOW (ref >60)
Glucose, Bld: 87 mg/dL (ref 70–99)
Potassium: 3.8 mmol/L (ref 3.5–5.1)
Sodium: 148 mmol/L — ABNORMAL HIGH (ref 135–145)

## 2019-07-21 MED ORDER — HYDROCODONE-ACETAMINOPHEN 5-325 MG PO TABS
2.0000 | ORAL_TABLET | Freq: Once | ORAL | Status: AC
  Administered 2019-07-21: 2 via ORAL
  Filled 2019-07-21: qty 2

## 2019-07-21 NOTE — ED Provider Notes (Signed)
[facility] EMERGENCY DEPARTMENT Provider Note   CSN: [id number] Arrival date & time: 07/21/19  0002     History Chief Complaint  Patient presents with  . Joel [name] is a 86 y.o. male.  The history is provided by the patient.  Fall This is a new problem. The problem occurs constantly. The problem has been gradually worsening. Pertinent negatives include no chest pain, no abdominal pain and no headaches. The symptoms are aggravated by walking. The symptoms are relieved by rest.      Patient with extensive history including atrial fibrillation, bipolar, colon cancer, CHF, COPD presents from nursing home after fall.  Patient reports he was going to the restroom prior to going to sleep.  While trying to get off the toilet he slipped twisting his right shoulder and also injured his hips.  No head injury.  No LOC.  No new neck or back pain.  No chest or abdominal pain.  He does not take anticoagulants Past Medical History:  Diagnosis Date  . Allergic rhinitis   . Anemia   . Anxiety   . Aortic stenosis 2012   mild to mod   . Atrial fibrillation ([place]) 1991   with multiple DCCV  . Bipolar affective disorder ([place])   . Cancer of ascending colon ([place]) 09/05/2017  . CHF (congestive heart failure) ([place]) 08/30/2017  . COPD (chronic obstructive pulmonary disease) ([place])   . Diabetic neuropathy ([place])    in feet  . Diabetic neuropathy ([place])   . Dyslipidemia   . Fatigue    last year or so  . Gout   . Hypertension   . Insomnia   . Obesity   . On home oxygen therapy 2 1/2 liters with bipap at night  . OSA (obstructive sleep apnea)    severe, uses bipap 16 1/2 by 12 or 13 setting  . Prostate cancer ([place])   . Rectal bleeding 12/16/2015  . Type 2 diabetes mellitus ([place])   . Vertigo     Patient Active Problem List   Diagnosis Date Noted  . CHF exacerbation ([place]) 01/23/2018  . Pain in right hip 12/06/2017  . Unilateral primary osteoarthritis, right knee  12/06/2017  . Normal coronary arteries 09/27/2017  . Pressure injury of skin 09/07/2017  . S/P colon resection 09/05/2017  . Cancer of ascending colon ([place]) 09/05/2017  . Dyspnea 09/01/2017  . Blood loss anemia 08/30/2017  . Closed displaced fracture of right femoral neck ([place]) 03/22/2017  . Cough 01/17/2017  . Persistent atrial fibrillation ([place]) 01/17/2017  . Leg swelling 09/26/2016  . GI bleed 05/15/2016  . UGI bleed 05/14/2016  . COPD (chronic obstructive pulmonary disease) ([place]) 05/14/2016  . Chronic GI bleeding 04/12/2016  . HLD (hyperlipidemia) 04/12/2016  . Anemia due to chronic blood loss 04/12/2016  . Diabetes mellitus with complication ([place])   . BPPV (benign paroxysmal positional vertigo) 01/27/2016  . Hypertension 01/27/2016  . Malignant neoplasm of prostate ([place]) 01/07/2016  . Bipolar I disorder ([place])   . Bright red rectal bleeding 12/16/2015  . Rectal bleeding 12/16/2015  . Pulmonary hypertension ([place]) 06/29/2015  . Moderate aortic stenosis 04/17/2013  . Encounter for monitoring Coumadin therapy 04/17/2013  . Anemia, iron deficiency 04/04/2013  . Dyspnea on exertion 11/18/2012  . Obesity hypoventilation syndrome ([place]) 09/19/2012  . ALLERGIC RHINITIS 06/24/2009  . DM2 (diabetes mellitus, type 2) ([place]) 03/13/2007  . OSA (obstructive sleep apnea) 03/13/2007  . COPD mixed type ([place]) 03/13/2007  Past Surgical History:  Procedure Laterality Date  . BIOPSY  09/02/2017   Procedure: BIOPSY;  Surgeon: Ronald Lobo, MD;  Location: [facility];  Service: Endoscopy;;  . CARDIOVERSION  yrs ago   prior to 1998  . COLON RESECTION N/A 09/05/2017   Procedure: LAPAROSCOPIC RIGHT COLON RESECTION;  Surgeon: Fanny Skates, MD;  Location: [facility];  Service: General;  Laterality: N/A;  . COLONOSCOPY N/A 09/02/2017   Procedure: COLONOSCOPY;  Surgeon: Ronald Lobo, MD;  Location: [facility];  Service: Endoscopy;  Laterality: N/A;  . COLONOSCOPY WITH PROPOFOL N/A 01/23/2013    Procedure: COLONOSCOPY WITH PROPOFOL;  Surgeon: Garlan Fair, MD;  Location: [facility];  Service: Endoscopy;  Laterality: N/A;  . electro shock  1969   for depression  . ENTEROSCOPY Left 05/17/2016   Procedure: ENTEROSCOPY;  Surgeon: Wilford Corner, MD;  Location: [facility];  Service: Endoscopy;  Laterality: Left;  . ESOPHAGOGASTRODUODENOSCOPY N/A 03/03/2016   Procedure: ESOPHAGOGASTRODUODENOSCOPY (EGD);  Surgeon: Teena Irani, MD;  Location: [facility];  Service: Endoscopy;  Laterality: N/A;  . ESOPHAGOGASTRODUODENOSCOPY N/A 03/26/2016   Procedure: ESOPHAGOGASTRODUODENOSCOPY (EGD);  Surgeon: Teena Irani, MD;  Location: [facility];  Service: Endoscopy;  Laterality: N/A;  would like to use ultraslim scope  . ESOPHAGOGASTRODUODENOSCOPY N/A 08/31/2017   Procedure: ESOPHAGOGASTRODUODENOSCOPY (EGD);  Surgeon: Laurence Spates, MD;  Location: [facility];  Service: Endoscopy;  Laterality: N/A;  . ESOPHAGOGASTRODUODENOSCOPY (EGD) WITH PROPOFOL N/A 01/23/2013   Procedure: ESOPHAGOGASTRODUODENOSCOPY (EGD) WITH PROPOFOL;  Surgeon: Garlan Fair, MD;  [facility];  Service: Endoscopy;  Laterality: N/A;  . ESOPHAGOGASTRODUODENOSCOPY (EGD) WITH PROPOFOL N/A 04/13/2016   Procedure: ESOPHAGOGASTRODUODENOSCOPY (EGD) WITH PROPOFOL;  Surgeon: Otis Brace, MD;  Location: [facility];  Service: Gastroenterology;  Laterality: N/A;  . GIVENS CAPSULE STUDY N/A 03/03/2016   Procedure: GIVENS CAPSULE STUDY;  Surgeon: Teena Irani, MD;  [facility];  Service: Endoscopy;  Laterality: N/A;  . HEMORRHOID SURGERY N/A 12/16/2015   Procedure: PROCTOSCOPY WITH CONTROL OF BLEEDING;  Surgeon: Fanny Skates, MD;  Location: [facility];  Service: General;  Laterality: N/A;  . HIP PINNING,CANNULATED Right 03/23/2017   Procedure: CANNULATED HIP PINNING;  Surgeon: Marybelle Killings, MD;  Location: [facility];  Service: Orthopedics;  Laterality: Right;  . KNEE SURGERY  1970's   rt  . LAPAROTOMY N/A 09/10/2017    Procedure: EXPLORATORY LAPAROTOMY, PARTIAL COLECTOMY;  Surgeon: Excell Seltzer, MD;  Location: [facility];  Service: General;  Laterality: N/A;  . [place]  . POLYPECTOMY  09/02/2017   Procedure: POLYPECTOMY;  Surgeon: Ronald Lobo, MD;  [facility];  Service: Endoscopy;;  . PROSTATE BIOPSY    . SHOULDER SURGERY  7-8 yrs ago   rt       Family History  Problem Relation Age of Onset  . Tuberculosis Maternal Grandmother   . Heart attack Neg Hx   . Diabetes Neg Hx   . CAD Neg Hx   . Cancer Neg Hx     Social History   Tobacco Use  . Smoking status: Former Smoker    Packs/day: 2.00    Years: 25.00    Pack years: 50.00    Types: Cigarettes    Quit date: 07/08/1976    Years since quitting: 43.0  . Smokeless tobacco: Never Used  Vaping Use  . Vaping Use: Never used  Substance Use Topics  . Alcohol use: No    Comment: in AA since 1977, no alcohol since 1977  . Drug use: No  Types: Marijuana    Comment: marijuana use 37 years ago    Home Medications Prior to Admission medications   Medication Sig Start Date End Date Taking? Authorizing Provider  allopurinol (ZYLOPRIM) 100 MG tablet Take 100 mg by mouth daily.     [provider]  Ascorbic Acid (VITAMIN C PO) Take 1,000 mg by mouth daily.     [provider]  buPROPion (WELLBUTRIN XL) 150 MG 24 hr tablet Take 1 tablet (150 mg total) by mouth daily. 12/18/15   Ambrose Finland, MD  citalopram (CELEXA) 20 MG tablet Take 1 tablet (20 mg total) by mouth daily. 09/17/17   Lavina Hamman, MD  diclofenac sodium (VOLTAREN) 1 % GEL Apply 1 application topically 4 (four) times daily. Pt uses once daily    [provider]  diltiazem (CARDIZEM CD) 120 MG 24 hr capsule Take 1 capsule (120 mg total) by mouth daily. 09/17/17   Lavina Hamman, MD  fluticasone Asencion Islam) 50 MCG/ACT nasal spray Place 2 sprays into both nostrils daily. Patient taking differently: Place 2 sprays into both nostrils  daily as needed for allergies or rhinitis.  01/27/16   Waynetta Pean, PA-C  furosemide (LASIX) 80 MG tablet Take 1 tablet (80 mg total) by mouth daily. Patient taking differently: Take 40 mg by mouth daily.  09/17/17   Lavina Hamman, MD  gabapentin (NEURONTIN) 100 MG capsule Take 2 capsules (200 mg total) by mouth 2 (two) times daily. 09/16/17   Lavina Hamman, MD  glimepiride (AMARYL) 1 MG tablet Take 1 mg by mouth daily with breakfast.  03/01/17   [provider]  guaifenesin (HUMIBID E) 400 MG TABS tablet Take 400 mg by mouth every 12 (twelve) hours as needed (for congestion, OTC).    [provider]  lovastatin (MEVACOR) 20 MG tablet Take 20 mg by mouth at bedtime.     [provider]  meclizine (ANTIVERT) 25 MG tablet Take 1 tablet (25 mg total) by mouth 3 (three) times daily as needed for dizziness. Patient taking differently: Take 25 mg by mouth at bedtime.  01/28/16   Eber Jones, MD  Multiple Vitamins-Minerals (VISION FORMULA PO) Take 1 tablet by mouth daily.     [provider]  OXYGEN Inhale 2 L into the lungs as needed (shortness of breath).     [provider]  pantoprazole (PROTONIX) 40 MG tablet Take 1 tablet (40 mg total) by mouth 2 (two) times daily. 09/16/17   Lavina Hamman, MD  potassium chloride SA (K-DUR,KLOR-CON) 20 MEQ tablet Take 20 mEq by mouth daily.  09/10/14   [provider]  sodium chloride (OCEAN) 0.65 % SOLN nasal spray Place 1 spray into both nostrils as needed for congestion.    [provider]  temazepam (RESTORIL) 15 MG capsule Take 1 capsule (15 mg total) by mouth at bedtime. 03/27/17   Roxan Hockey, MD    Allergies    Penicillins; Antihistamines, loratadine-type; and Other  Review of Systems   Review of Systems  Constitutional: Negative for fever.  Cardiovascular: Negative for chest pain.  Gastrointestinal: Negative for abdominal pain.  Musculoskeletal: Positive for arthralgias.  Negative for neck pain.  Neurological: Negative for headaches.  All other systems reviewed and are negative.   Physical Exam Updated Vital Signs BP (!) 125/41   Pulse 91   Temp 97.7 F (36.5 C)   Resp 20   SpO2 94%   Physical Exam CONSTITUTIONAL: Elderly HEAD: Normocephalic/atraumatic EYES:  EOMI ENMT: Mucous membranes moist NECK: supple no meningeal signs SPINE/BACK: No cervical spine tenderness CV: S1/S2 noted LUNGS: Lungs are clear to auscultation bilaterally, no apparent distress ABDOMEN: soft, nontender, no rebound or guarding, bowel sounds noted throughout abdomen, large abdominal wall hernia that is nontender and patient reports is chronic NEURO: Pt is awake/alert/appropriate, moves all extremitiesx4.  No facial droop.  GCS 15 EXTREMITIES: pulses normal/equal, full ROM, tenderness noted to right shoulder.  Tenderness noted to bilateral thighs and right knee.  Chronic lower extremity edema and skin changes noted. All other extremities/joints palpated/ranged and nontender SKIN: warm, color normal PSYCH: no abnormalities of mood noted, alert and oriented to situation  ED Results / Procedures / Treatments   Labs (all labs ordered are listed, but only abnormal results are displayed) Labs Reviewed  BASIC METABOLIC PANEL - Abnormal; Notable for the following components:      Result Value   Sodium 148 (*)    BUN 30 (*)    Creatinine, Ser 1.59 (*)    GFR calc non Af Amer 39 (*)    GFR calc Af Amer 46 (*)    All other components within normal limits  CBC WITH DIFFERENTIAL/PLATELET - Abnormal; Notable for the following components:   RBC 3.04 (*)    Hemoglobin 8.7 (*)    HCT 29.9 (*)    MCHC 29.1 (*)    RDW 15.8 (*)    Lymphs Abs 0.5 (*)    All other components within normal limits    EKG EKG Interpretation  Date/Time:  Saturday July 21 2019 01:41:06 EDT Ventricular Rate:  87 PR Interval:    QRS Duration: 111 QT Interval:  440 QTC Calculation: 530 R  Axis:   33 Text Interpretation: Atrial fibrillation Nonspecific repol abnormality, diffuse leads Prolonged QT interval No significant change since last tracing Interpretation limited secondary to artifact Confirmed by Ripley Fraise [id number]) on 07/21/2019 2:13:10 AM   Radiology DG Shoulder Right  Result Date: 07/21/2019 CLINICAL DATA:  Pain EXAM: RIGHT SHOULDER - 2+ VIEW COMPARISON:  None. FINDINGS: There is no evidence of fracture or dislocation. Advanced right shoulder osteoarthritis is seen with joint space loss and marginal osteophyte formation. There is diffuse osteopenia. IMPRESSION: No acute osseous abnormality. Electronically Signed   By: Prudencio Pair M.D.   On: 07/21/2019 01:45   DG Knee Complete 4 Views Right  Result Date: 07/21/2019 CLINICAL DATA:  Pain EXAM: RIGHT KNEE - COMPLETE 4+ VIEW COMPARISON:  None. FINDINGS: No fracture or dislocation. There is tricompartmental osteoarthritis, most notable in the medial compartment and patellofemoral compartment. No large knee joint effusion. There is soft tissue swelling over the medial aspect of the knee. Scattered vascular calcifications are noted. IMPRESSION: No acute osseous abnormality. Electronically Signed   By: Prudencio Pair M.D.   On: 07/21/2019 01:51   DG Hip Unilat W or Wo Pelvis 2-3 Views Left  Result Date: 07/21/2019 CLINICAL DATA:  Pain. Fall off toilet while using the bathroom. EXAM: DG HIP (WITH OR WITHOUT PELVIS) 2-3V LEFT COMPARISON:  None. FINDINGS: Right hip better assessed on concurrent right hip radiograph, performed concurrently. No evidence of pelvis or left hip fracture. Left femoral head is well seated in the acetabulum. Pubic rami are intact. Pubic symphysis and sacroiliac joints are congruent. Surgical clips in the region of the prostate. IMPRESSION: No fracture of the pelvis or left hip. Electronically Signed   By: Keith Rake M.D.   On: 07/21/2019 01:53   DG Hip Unilat W  or Wo Pelvis 2-3 Views Right  Result  Date: 07/21/2019 CLINICAL DATA:  Pain. Fall off toilet while using the bathroom. Right hip and knee pain. EXAM: DG HIP (WITH OR WITHOUT PELVIS) 2-3V RIGHT COMPARISON:  Pelvis and hip radiograph 01/23/2018 FINDINGS: Mild a screw fixation of remote proximal femur fracture. The screws extend into the soft tissues approximately 3.6 cm laterally, which appears slightly increased from prior exam. Remote femoral neck fracture in unchanged alignment. Femoral head remains seated. No evidence of acute hip fracture. The pubic rami are intact. IMPRESSION: 1. No acute fracture of the pelvis or right hip. 2. Screw fixation of remote proximal femur fracture. The screws extend into the lateral soft tissues, which appears slightly increased from prior exam. Prior femoral neck fracture is unchanged in alignment. Electronically Signed   By: Keith Rake M.D.   On: 07/21/2019 01:52    Procedures Procedures    Medications Ordered in ED Medications  HYDROcodone-acetaminophen (NORCO/VICODIN) 5-325 MG per tablet 2 tablet (2 tablets Oral Given 07/21/19 0231)    ED Course  I have reviewed the triage vital signs and the nursing notes.  Pertinent labs & imaging results that were available during my care of the patient were reviewed by me and considered in my medical decision making (see chart for details).    MDM Rules/Calculators/A&P                          Patient presents from nursing facility after mechanical fall.  Patient reports he fell while trying to use the commode.  Denies head injury or LOC.  He reports most of his pain is in his right shoulder and right leg.  There is no acute traumatic injuries.  No signs of any head/chest/abdominal trauma.  No neck tenderness.  His pelvis is stable.  I can range all extremities.  Likely has soft tissue injury or rotator cuff injury of the right shoulder.  He is able to abduct the shoulder.  Patient otherwise appropriate for discharge.  Patient actually is wheelchair-bound  at this time at the facility Labs were reviewed and reveal chronic anemia.  No other acute findings. Final Clinical Impression(s) / ED Diagnoses Final diagnoses:  Fall, initial encounter  Strain of right shoulder, initial encounter  Hip strain, unspecified laterality, initial encounter  Sprain of right knee, unspecified ligament, initial encounter    Rx / DC Orders ED Discharge Orders    None       Ripley Fraise, MD 07/21/19 [phone]

## 2019-07-21 NOTE — ED Notes (Signed)
Transported to XRAY  

## 2019-07-21 NOTE — ED Triage Notes (Signed)
BIB by [facility], after PT had fallen while using the commode at [facility]. PT reported dizziness, R shoulder, R knee, and L hip pain. PT has previous hx of knee/hip problems. Pt denies hitting head. Facility nurses reported to EMS that they are on blood thinners. B/P upon arrival 121/60, 82 HR.

## 2019-07-24 DIAGNOSIS — S81802D Unspecified open wound, left lower leg, subsequent encounter: Secondary | ICD-10-CM | POA: Diagnosis not present

## 2019-07-24 DIAGNOSIS — S81801D Unspecified open wound, right lower leg, subsequent encounter: Secondary | ICD-10-CM | POA: Diagnosis not present

## 2019-07-25 DIAGNOSIS — Z961 Presence of intraocular lens: Secondary | ICD-10-CM | POA: Diagnosis not present

## 2019-07-25 DIAGNOSIS — E119 Type 2 diabetes mellitus without complications: Secondary | ICD-10-CM | POA: Diagnosis not present

## 2019-07-25 DIAGNOSIS — H524 Presbyopia: Secondary | ICD-10-CM | POA: Diagnosis not present

## 2019-07-25 DIAGNOSIS — Z794 Long term (current) use of insulin: Secondary | ICD-10-CM | POA: Diagnosis not present

## 2019-07-26 DIAGNOSIS — I872 Venous insufficiency (chronic) (peripheral): Secondary | ICD-10-CM | POA: Diagnosis not present

## 2019-07-26 DIAGNOSIS — D649 Anemia, unspecified: Secondary | ICD-10-CM | POA: Diagnosis not present

## 2019-07-26 DIAGNOSIS — E1140 Type 2 diabetes mellitus with diabetic neuropathy, unspecified: Secondary | ICD-10-CM | POA: Diagnosis not present

## 2019-07-26 DIAGNOSIS — E7849 Other hyperlipidemia: Secondary | ICD-10-CM | POA: Diagnosis not present

## 2019-07-26 DIAGNOSIS — I5032 Chronic diastolic (congestive) heart failure: Secondary | ICD-10-CM | POA: Diagnosis not present

## 2019-07-30 DIAGNOSIS — F3132 Bipolar disorder, current episode depressed, moderate: Secondary | ICD-10-CM | POA: Diagnosis not present

## 2019-08-03 DIAGNOSIS — I872 Venous insufficiency (chronic) (peripheral): Secondary | ICD-10-CM | POA: Diagnosis not present

## 2019-08-03 DIAGNOSIS — D649 Anemia, unspecified: Secondary | ICD-10-CM | POA: Diagnosis not present

## 2019-08-03 DIAGNOSIS — I509 Heart failure, unspecified: Secondary | ICD-10-CM | POA: Diagnosis not present

## 2019-08-03 DIAGNOSIS — R600 Localized edema: Secondary | ICD-10-CM | POA: Diagnosis not present

## 2019-08-03 DIAGNOSIS — L03115 Cellulitis of right lower limb: Secondary | ICD-10-CM | POA: Diagnosis not present

## 2019-08-03 DIAGNOSIS — R319 Hematuria, unspecified: Secondary | ICD-10-CM | POA: Diagnosis not present

## 2019-08-03 DIAGNOSIS — N390 Urinary tract infection, site not specified: Secondary | ICD-10-CM | POA: Diagnosis not present

## 2019-08-03 DIAGNOSIS — I5032 Chronic diastolic (congestive) heart failure: Secondary | ICD-10-CM | POA: Diagnosis not present

## 2019-08-03 DIAGNOSIS — J189 Pneumonia, unspecified organism: Secondary | ICD-10-CM | POA: Diagnosis not present

## 2019-08-03 DIAGNOSIS — R110 Nausea: Secondary | ICD-10-CM | POA: Diagnosis not present

## 2019-08-03 DIAGNOSIS — R05 Cough: Secondary | ICD-10-CM | POA: Diagnosis not present

## 2019-08-03 DIAGNOSIS — E1140 Type 2 diabetes mellitus with diabetic neuropathy, unspecified: Secondary | ICD-10-CM | POA: Diagnosis not present

## 2019-08-04 DIAGNOSIS — I10 Essential (primary) hypertension: Secondary | ICD-10-CM | POA: Diagnosis not present

## 2019-08-04 DIAGNOSIS — E785 Hyperlipidemia, unspecified: Secondary | ICD-10-CM | POA: Diagnosis not present

## 2019-08-05 ENCOUNTER — Inpatient Hospital Stay (HOSPITAL_COMMUNITY): Payer: Medicare HMO

## 2019-08-05 ENCOUNTER — Inpatient Hospital Stay (HOSPITAL_COMMUNITY)
Admission: EM | Admit: 2019-08-05 | Discharge: 2019-09-02 | DRG: 637 | Disposition: E | Payer: Medicare HMO | Attending: Internal Medicine | Admitting: Internal Medicine

## 2019-08-05 ENCOUNTER — Encounter (HOSPITAL_COMMUNITY): Payer: Self-pay | Admitting: Emergency Medicine

## 2019-08-05 ENCOUNTER — Emergency Department (HOSPITAL_COMMUNITY): Payer: Medicare HMO

## 2019-08-05 ENCOUNTER — Other Ambulatory Visit: Payer: Self-pay

## 2019-08-05 DIAGNOSIS — G9389 Other specified disorders of brain: Secondary | ICD-10-CM | POA: Diagnosis not present

## 2019-08-05 DIAGNOSIS — Z0389 Encounter for observation for other suspected diseases and conditions ruled out: Secondary | ICD-10-CM | POA: Diagnosis not present

## 2019-08-05 DIAGNOSIS — I6381 Other cerebral infarction due to occlusion or stenosis of small artery: Secondary | ICD-10-CM | POA: Diagnosis not present

## 2019-08-05 DIAGNOSIS — E11649 Type 2 diabetes mellitus with hypoglycemia without coma: Secondary | ICD-10-CM | POA: Diagnosis not present

## 2019-08-05 DIAGNOSIS — L89629 Pressure ulcer of left heel, unspecified stage: Secondary | ICD-10-CM | POA: Diagnosis present

## 2019-08-05 DIAGNOSIS — I639 Cerebral infarction, unspecified: Secondary | ICD-10-CM | POA: Diagnosis not present

## 2019-08-05 DIAGNOSIS — R0602 Shortness of breath: Secondary | ICD-10-CM | POA: Diagnosis not present

## 2019-08-05 DIAGNOSIS — G9341 Metabolic encephalopathy: Secondary | ICD-10-CM

## 2019-08-05 DIAGNOSIS — R778 Other specified abnormalities of plasma proteins: Secondary | ICD-10-CM

## 2019-08-05 DIAGNOSIS — I110 Hypertensive heart disease with heart failure: Secondary | ICD-10-CM | POA: Diagnosis present

## 2019-08-05 DIAGNOSIS — Z79899 Other long term (current) drug therapy: Secondary | ICD-10-CM

## 2019-08-05 DIAGNOSIS — I7090 Unspecified atherosclerosis: Secondary | ICD-10-CM | POA: Diagnosis not present

## 2019-08-05 DIAGNOSIS — R509 Fever, unspecified: Secondary | ICD-10-CM | POA: Diagnosis not present

## 2019-08-05 DIAGNOSIS — J209 Acute bronchitis, unspecified: Secondary | ICD-10-CM | POA: Diagnosis present

## 2019-08-05 DIAGNOSIS — E785 Hyperlipidemia, unspecified: Secondary | ICD-10-CM | POA: Diagnosis present

## 2019-08-05 DIAGNOSIS — I5033 Acute on chronic diastolic (congestive) heart failure: Secondary | ICD-10-CM | POA: Diagnosis not present

## 2019-08-05 DIAGNOSIS — G4733 Obstructive sleep apnea (adult) (pediatric): Secondary | ICD-10-CM | POA: Diagnosis present

## 2019-08-05 DIAGNOSIS — I350 Nonrheumatic aortic (valve) stenosis: Secondary | ICD-10-CM | POA: Diagnosis not present

## 2019-08-05 DIAGNOSIS — L89619 Pressure ulcer of right heel, unspecified stage: Secondary | ICD-10-CM | POA: Diagnosis present

## 2019-08-05 DIAGNOSIS — R52 Pain, unspecified: Secondary | ICD-10-CM | POA: Diagnosis not present

## 2019-08-05 DIAGNOSIS — Z8546 Personal history of malignant neoplasm of prostate: Secondary | ICD-10-CM | POA: Diagnosis not present

## 2019-08-05 DIAGNOSIS — J9621 Acute and chronic respiratory failure with hypoxia: Secondary | ICD-10-CM | POA: Diagnosis not present

## 2019-08-05 DIAGNOSIS — G9340 Encephalopathy, unspecified: Secondary | ICD-10-CM | POA: Diagnosis not present

## 2019-08-05 DIAGNOSIS — R7989 Other specified abnormal findings of blood chemistry: Secondary | ICD-10-CM | POA: Diagnosis not present

## 2019-08-05 DIAGNOSIS — Z20822 Contact with and (suspected) exposure to covid-19: Secondary | ICD-10-CM | POA: Diagnosis present

## 2019-08-05 DIAGNOSIS — Z9981 Dependence on supplemental oxygen: Secondary | ICD-10-CM | POA: Diagnosis not present

## 2019-08-05 DIAGNOSIS — E162 Hypoglycemia, unspecified: Secondary | ICD-10-CM

## 2019-08-05 DIAGNOSIS — R0609 Other forms of dyspnea: Secondary | ICD-10-CM | POA: Diagnosis not present

## 2019-08-05 DIAGNOSIS — E873 Alkalosis: Secondary | ICD-10-CM | POA: Diagnosis present

## 2019-08-05 DIAGNOSIS — G92 Toxic encephalopathy: Secondary | ICD-10-CM | POA: Diagnosis not present

## 2019-08-05 DIAGNOSIS — Z87891 Personal history of nicotine dependence: Secondary | ICD-10-CM

## 2019-08-05 DIAGNOSIS — Z5309 Procedure and treatment not carried out because of other contraindication: Secondary | ICD-10-CM

## 2019-08-05 DIAGNOSIS — Z85038 Personal history of other malignant neoplasm of large intestine: Secondary | ICD-10-CM

## 2019-08-05 DIAGNOSIS — I482 Chronic atrial fibrillation, unspecified: Secondary | ICD-10-CM | POA: Diagnosis not present

## 2019-08-05 DIAGNOSIS — R4182 Altered mental status, unspecified: Secondary | ICD-10-CM

## 2019-08-05 DIAGNOSIS — J810 Acute pulmonary edema: Secondary | ICD-10-CM | POA: Diagnosis not present

## 2019-08-05 DIAGNOSIS — F319 Bipolar disorder, unspecified: Secondary | ICD-10-CM | POA: Diagnosis present

## 2019-08-05 DIAGNOSIS — J3489 Other specified disorders of nose and nasal sinuses: Secondary | ICD-10-CM | POA: Diagnosis not present

## 2019-08-05 DIAGNOSIS — G319 Degenerative disease of nervous system, unspecified: Secondary | ICD-10-CM | POA: Diagnosis not present

## 2019-08-05 DIAGNOSIS — J440 Chronic obstructive pulmonary disease with acute lower respiratory infection: Secondary | ICD-10-CM | POA: Diagnosis not present

## 2019-08-05 DIAGNOSIS — E119 Type 2 diabetes mellitus without complications: Secondary | ICD-10-CM

## 2019-08-05 DIAGNOSIS — E11641 Type 2 diabetes mellitus with hypoglycemia with coma: Secondary | ICD-10-CM | POA: Diagnosis not present

## 2019-08-05 DIAGNOSIS — Z794 Long term (current) use of insulin: Secondary | ICD-10-CM | POA: Diagnosis not present

## 2019-08-05 DIAGNOSIS — G931 Anoxic brain damage, not elsewhere classified: Secondary | ICD-10-CM | POA: Diagnosis present

## 2019-08-05 DIAGNOSIS — E1140 Type 2 diabetes mellitus with diabetic neuropathy, unspecified: Secondary | ICD-10-CM | POA: Diagnosis present

## 2019-08-05 DIAGNOSIS — Z66 Do not resuscitate: Secondary | ICD-10-CM | POA: Diagnosis not present

## 2019-08-05 DIAGNOSIS — I4891 Unspecified atrial fibrillation: Secondary | ICD-10-CM | POA: Diagnosis not present

## 2019-08-05 DIAGNOSIS — E876 Hypokalemia: Secondary | ICD-10-CM | POA: Diagnosis present

## 2019-08-05 DIAGNOSIS — H5500 Unspecified nystagmus: Secondary | ICD-10-CM | POA: Diagnosis present

## 2019-08-05 DIAGNOSIS — N179 Acute kidney failure, unspecified: Secondary | ICD-10-CM | POA: Diagnosis present

## 2019-08-05 DIAGNOSIS — M109 Gout, unspecified: Secondary | ICD-10-CM | POA: Diagnosis present

## 2019-08-05 DIAGNOSIS — J9601 Acute respiratory failure with hypoxia: Secondary | ICD-10-CM | POA: Diagnosis not present

## 2019-08-05 DIAGNOSIS — J189 Pneumonia, unspecified organism: Secondary | ICD-10-CM | POA: Diagnosis not present

## 2019-08-05 DIAGNOSIS — I509 Heart failure, unspecified: Secondary | ICD-10-CM

## 2019-08-05 DIAGNOSIS — K219 Gastro-esophageal reflux disease without esophagitis: Secondary | ICD-10-CM | POA: Diagnosis present

## 2019-08-05 DIAGNOSIS — J90 Pleural effusion, not elsewhere classified: Secondary | ICD-10-CM | POA: Diagnosis not present

## 2019-08-05 DIAGNOSIS — E161 Other hypoglycemia: Secondary | ICD-10-CM | POA: Diagnosis not present

## 2019-08-05 DIAGNOSIS — D509 Iron deficiency anemia, unspecified: Secondary | ICD-10-CM | POA: Diagnosis present

## 2019-08-05 DIAGNOSIS — R4020 Unspecified coma: Secondary | ICD-10-CM | POA: Diagnosis not present

## 2019-08-05 DIAGNOSIS — Z9049 Acquired absence of other specified parts of digestive tract: Secondary | ICD-10-CM

## 2019-08-05 DIAGNOSIS — Z515 Encounter for palliative care: Secondary | ICD-10-CM | POA: Diagnosis not present

## 2019-08-05 DIAGNOSIS — R404 Transient alteration of awareness: Secondary | ICD-10-CM | POA: Diagnosis not present

## 2019-08-05 LAB — GLUCOSE, CAPILLARY
Glucose-Capillary: 91 mg/dL (ref 70–99)
Glucose-Capillary: 109 mg/dL — ABNORMAL HIGH (ref 70–99)

## 2019-08-05 LAB — CBG MONITORING, ED
Glucose-Capillary: 109 mg/dL — ABNORMAL HIGH (ref 70–99)
Glucose-Capillary: 89 mg/dL (ref 70–99)
Glucose-Capillary: 31 mg/dL — CL (ref 70–99)
Glucose-Capillary: 121 mg/dL — ABNORMAL HIGH (ref 70–99)
Glucose-Capillary: 63 mg/dL — ABNORMAL LOW (ref 70–99)
Glucose-Capillary: 76 mg/dL (ref 70–99)
Glucose-Capillary: 87 mg/dL (ref 70–99)

## 2019-08-05 LAB — COMPREHENSIVE METABOLIC PANEL
ALT: 19 U/L (ref 0–44)
AST: 50 U/L — ABNORMAL HIGH (ref 15–41)
Albumin: 3 g/dL — ABNORMAL LOW (ref 3.5–5.0)
Alkaline Phosphatase: 76 U/L (ref 38–126)
Anion gap: 11 (ref 5–15)
BUN: 41 mg/dL — ABNORMAL HIGH (ref 8–23)
CO2: 30 mmol/L (ref 22–32)
Calcium: 8.6 mg/dL — ABNORMAL LOW (ref 8.9–10.3)
Chloride: 100 mmol/L (ref 98–111)
Creatinine, Ser: 1.71 mg/dL — ABNORMAL HIGH (ref 0.61–1.24)
GFR calc Af Amer: 42 mL/min — ABNORMAL LOW (ref >60)
GFR calc non Af Amer: 36 mL/min — ABNORMAL LOW (ref >60)
Glucose, Bld: 88 mg/dL (ref 70–99)
Potassium: 3.4 mmol/L — ABNORMAL LOW (ref 3.5–5.1)
Sodium: 141 mmol/L (ref 135–145)
Total Bilirubin: 0.8 mg/dL (ref 0.3–1.2)
Total Protein: 6.7 g/dL (ref 6.5–8.1)

## 2019-08-05 LAB — I-STAT ARTERIAL BLOOD GAS, ED
Acid-Base Excess: 12 mmol/L — ABNORMAL HIGH (ref 0.0–2.0)
Bicarbonate: 35.4 mmol/L — ABNORMAL HIGH (ref 20.0–28.0)
Calcium, Ion: 1.11 mmol/L — ABNORMAL LOW (ref 1.15–1.40)
HCT: 27 % — ABNORMAL LOW (ref 39.0–52.0)
Hemoglobin: 9.2 g/dL — ABNORMAL LOW (ref 13.0–17.0)
O2 Saturation: 100 %
Patient temperature: 98.6
Potassium: 3.6 mmol/L (ref 3.5–5.1)
Sodium: 145 mmol/L (ref 135–145)
TCO2: 37 mmol/L — ABNORMAL HIGH (ref 22–32)
pCO2 arterial: 41.1 mmHg (ref 32.0–48.0)
pH, Arterial: 7.543 — ABNORMAL HIGH (ref 7.350–7.450)
pO2, Arterial: 152 mmHg — ABNORMAL HIGH (ref 83.0–108.0)

## 2019-08-05 LAB — CBC WITH DIFFERENTIAL/PLATELET
Abs Immature Granulocytes: 0.05 10*3/uL (ref 0.00–0.07)
Basophils Absolute: 0 10*3/uL (ref 0.0–0.1)
Basophils Relative: 0 %
Eosinophils Absolute: 0 10*3/uL (ref 0.0–0.5)
Eosinophils Relative: 0 %
HCT: 30 % — ABNORMAL LOW (ref 39.0–52.0)
Hemoglobin: 8.7 g/dL — ABNORMAL LOW (ref 13.0–17.0)
Immature Granulocytes: 1 %
Lymphocytes Relative: 3 %
Lymphs Abs: 0.3 10*3/uL — ABNORMAL LOW (ref 0.7–4.0)
MCH: 28.9 pg (ref 26.0–34.0)
MCHC: 29 g/dL — ABNORMAL LOW (ref 30.0–36.0)
MCV: 99.7 fL (ref 80.0–100.0)
Monocytes Absolute: 0.7 10*3/uL (ref 0.1–1.0)
Monocytes Relative: 6 %
Neutro Abs: 9.8 10*3/uL — ABNORMAL HIGH (ref 1.7–7.7)
Neutrophils Relative %: 90 %
Platelets: 166 10*3/uL (ref 150–400)
RBC: 3.01 MIL/uL — ABNORMAL LOW (ref 4.22–5.81)
RDW: 16.4 % — ABNORMAL HIGH (ref 11.5–15.5)
WBC: 10.8 10*3/uL — ABNORMAL HIGH (ref 4.0–10.5)
nRBC: 0 % (ref 0.0–0.2)

## 2019-08-05 LAB — URINALYSIS, ROUTINE W REFLEX MICROSCOPIC
Bilirubin Urine: NEGATIVE
Glucose, UA: NEGATIVE mg/dL
Hgb urine dipstick: NEGATIVE
Ketones, ur: NEGATIVE mg/dL
Leukocytes,Ua: NEGATIVE
Nitrite: NEGATIVE
Protein, ur: NEGATIVE mg/dL
Specific Gravity, Urine: 1.011 (ref 1.005–1.030)
pH: 5 (ref 5.0–8.0)

## 2019-08-05 LAB — LACTIC ACID, PLASMA
Lactic Acid, Venous: 1.1 mmol/L (ref 0.5–1.9)
Lactic Acid, Venous: 1.4 mmol/L (ref 0.5–1.9)

## 2019-08-05 LAB — SARS CORONAVIRUS 2 BY RT PCR (HOSPITAL ORDER, PERFORMED IN ~~LOC~~ HOSPITAL LAB): SARS Coronavirus 2: NEGATIVE

## 2019-08-05 LAB — TROPONIN I (HIGH SENSITIVITY)
Troponin I (High Sensitivity): 345 ng/L (ref <18)
Troponin I (High Sensitivity): 355 ng/L (ref <18)

## 2019-08-05 LAB — BRAIN NATRIURETIC PEPTIDE: B Natriuretic Peptide: 328.3 pg/mL — ABNORMAL HIGH (ref 0.0–100.0)

## 2019-08-05 MED ORDER — DOXYCYCLINE HYCLATE 100 MG PO TABS: 100.0000 mg | ORAL_TABLET | Freq: Two times a day (BID) | ORAL | Status: DC

## 2019-08-05 MED ORDER — FUROSEMIDE 10 MG/ML IJ SOLN
80.0000 mg | Freq: Once | INTRAMUSCULAR | Status: AC
  Administered 2019-08-05: 80 mg via INTRAVENOUS
  Filled 2019-08-05: qty 8

## 2019-08-05 MED ORDER — DEXTROSE 10 % IV SOLN: INTRAVENOUS | Status: DC

## 2019-08-05 MED ORDER — SODIUM CHLORIDE 0.9 % IV SOLN
500.0000 mg | Freq: Once | INTRAVENOUS | Status: DC
  Filled 2019-08-05: qty 500

## 2019-08-05 MED ORDER — GABAPENTIN 100 MG PO CAPS: 200.0000 mg | ORAL_CAPSULE | Freq: Two times a day (BID) | ORAL | Status: DC

## 2019-08-05 MED ORDER — BUPROPION HCL ER (XL) 150 MG PO TB24: 300.0000 mg | ORAL_TABLET | Freq: Every day | ORAL | Status: DC

## 2019-08-05 MED ORDER — DILTIAZEM HCL-DEXTROSE 125-5 MG/125ML-% IV SOLN (PREMIX)
5.0000 mg/h | INTRAVENOUS | Status: DC
  Filled 2019-08-05: qty 125

## 2019-08-05 MED ORDER — ACETAMINOPHEN 10 MG/ML IV SOLN
1000.0000 mg | Freq: Four times a day (QID) | INTRAVENOUS | Status: DC | PRN
  Administered 2019-08-06: 1000 mg via INTRAVENOUS
  Filled 2019-08-05: qty 100

## 2019-08-05 MED ORDER — DEXTROSE-NACL 5-0.9 % IV SOLN: INTRAVENOUS | Status: DC

## 2019-08-05 MED ORDER — METRONIDAZOLE IN NACL 5-0.79 MG/ML-% IV SOLN: 500.0000 mg | Freq: Three times a day (TID) | INTRAVENOUS | Status: DC

## 2019-08-05 MED ORDER — METOPROLOL TARTRATE 5 MG/5ML IV SOLN
2.5000 mg | Freq: Four times a day (QID) | INTRAVENOUS | Status: DC | PRN
  Administered 2019-08-06 – 2019-08-08 (×4): 2.5 mg via INTRAVENOUS
  Filled 2019-08-05 (×4): qty 5

## 2019-08-05 MED ORDER — IPRATROPIUM BROMIDE 0.02 % IN SOLN
0.5000 mg | Freq: Once | RESPIRATORY_TRACT | Status: AC
  Administered 2019-08-05: 0.5 mg via RESPIRATORY_TRACT

## 2019-08-05 MED ORDER — LEVALBUTEROL HCL 0.63 MG/3ML IN NEBU
0.6300 mg | INHALATION_SOLUTION | Freq: Three times a day (TID) | RESPIRATORY_TRACT | Status: DC
  Administered 2019-08-05 – 2019-08-07 (×7): 0.63 mg via RESPIRATORY_TRACT
  Filled 2019-08-05 (×6): qty 3

## 2019-08-05 MED ORDER — INSULIN ASPART 100 UNIT/ML ~~LOC~~ SOLN
0.0000 [IU] | Freq: Three times a day (TID) | SUBCUTANEOUS | Status: DC
  Administered 2019-08-06 – 2019-08-07 (×6): 2 [IU] via SUBCUTANEOUS
  Administered 2019-08-08: 1 [IU] via SUBCUTANEOUS
  Administered 2019-08-08: 2 [IU] via SUBCUTANEOUS

## 2019-08-05 MED ORDER — ARTIFICIAL TEARS OPHTHALMIC OINT
TOPICAL_OINTMENT | OPHTHALMIC | Status: DC | PRN
  Filled 2019-08-05: qty 3.5

## 2019-08-05 MED ORDER — ACETAMINOPHEN 650 MG RE SUPP
650.0000 mg | RECTAL | Status: DC | PRN
  Administered 2019-08-05 (×2): 650 mg via RECTAL
  Filled 2019-08-05 (×3): qty 1

## 2019-08-05 MED ORDER — ONDANSETRON HCL 4 MG/2ML IJ SOLN: 4.0000 mg | Freq: Four times a day (QID) | INTRAMUSCULAR | Status: DC | PRN

## 2019-08-05 MED ORDER — CITALOPRAM HYDROBROMIDE 20 MG PO TABS: 20.0000 mg | ORAL_TABLET | Freq: Every day | ORAL | Status: DC

## 2019-08-05 MED ORDER — CHLORHEXIDINE GLUCONATE CLOTH 2 % EX PADS
6.0000 | MEDICATED_PAD | Freq: Every day | CUTANEOUS | Status: DC
  Administered 2019-08-05 – 2019-08-07 (×3): 6 via TOPICAL

## 2019-08-05 MED ORDER — GLUCAGON HCL RDNA (DIAGNOSTIC) 1 MG IJ SOLR: 1.0000 mg | Freq: Once | INTRAMUSCULAR | Status: DC | PRN

## 2019-08-05 MED ORDER — FUROSEMIDE 10 MG/ML IJ SOLN
40.0000 mg | Freq: Once | INTRAMUSCULAR | Status: AC
  Administered 2019-08-05: 40 mg via INTRAVENOUS
  Filled 2019-08-05: qty 4

## 2019-08-05 MED ORDER — SODIUM CHLORIDE 0.9 % IV SOLN
1.0000 g | Freq: Once | INTRAVENOUS | Status: AC
  Administered 2019-08-05: 1 g via INTRAVENOUS
  Filled 2019-08-05: qty 10

## 2019-08-05 MED ORDER — ALBUTEROL SULFATE (2.5 MG/3ML) 0.083% IN NEBU
5.0000 mg | INHALATION_SOLUTION | Freq: Once | RESPIRATORY_TRACT | Status: AC
  Administered 2019-08-05: 5 mg via RESPIRATORY_TRACT

## 2019-08-05 MED ORDER — DEXTROSE 50 % IV SOLN
INTRAVENOUS | Status: AC
  Administered 2019-08-05: 100 mL
  Filled 2019-08-05: qty 50

## 2019-08-05 MED ORDER — ENOXAPARIN SODIUM 40 MG/0.4ML ~~LOC~~ SOLN
40.0000 mg | SUBCUTANEOUS | Status: DC
  Administered 2019-08-06 – 2019-08-08 (×3): 40 mg via SUBCUTANEOUS
  Filled 2019-08-05 (×3): qty 0.4

## 2019-08-05 MED ORDER — SODIUM CHLORIDE 0.9 % IV SOLN: 1.0000 g | INTRAVENOUS | Status: DC

## 2019-08-05 MED ORDER — ARIPIPRAZOLE 2 MG PO TABS
2.0000 mg | ORAL_TABLET | Freq: Every day | ORAL | Status: DC
  Filled 2019-08-05 (×4): qty 1

## 2019-08-05 NOTE — ED Provider Notes (Signed)
[facility] EMERGENCY DEPARTMENT Provider Note   CSN: [id number] Arrival date & time: 08/21/2019  6789     History Chief Complaint  Patient presents with  . Altered Mental Status  . Hypoglycemia    Joel [name] is a 86 y.o. male.  HPI Patient brought to the ED via EMS from [facility] for AMS/unresponsive. Per EMS he was found unresponsive this morning by SNF staff. They noted his glucose to be 23 and gave him IM Glucagon. EMS reports improvement in glucose but no improvement in mental status. He was also noted to have pin point pupils and a fentanyl patch. This was removed and he was given narcan without improvement. He was also hypoxic with SpO2 in the 70s on his usual 2L [place], he was placed on NRB with improved oxygenation but no improvement in mental status. He is DNR.     Past Medical History:  Diagnosis Date  . Allergic rhinitis   . Anemia   . Anxiety   . Aortic stenosis 2012   mild to mod   . Atrial fibrillation ([place]) 1991   with multiple DCCV  . Bipolar affective disorder ([place])   . Cancer of ascending colon ([place]) 09/05/2017  . CHF (congestive heart failure) ([place]) 08/30/2017  . COPD (chronic obstructive pulmonary disease) ([place])   . Diabetic neuropathy ([place])    in feet  . Diabetic neuropathy ([place])   . Dyslipidemia   . Fatigue    last year or so  . Gout   . Hypertension   . Insomnia   . Obesity   . On home oxygen therapy 2 1/2 liters with bipap at night  . OSA (obstructive sleep apnea)    severe, uses bipap 16 1/2 by 12 or 13 setting  . Prostate cancer ([place])   . Rectal bleeding 12/16/2015  . Type 2 diabetes mellitus ([place])   . Vertigo     Patient Active Problem List   Diagnosis Date Noted  . CHF exacerbation ([place]) 01/23/2018  . Pain in right hip 12/06/2017  . Unilateral primary osteoarthritis, right knee 12/06/2017  . Normal coronary arteries 09/27/2017  . Pressure injury of skin 09/07/2017  . S/P colon resection 09/05/2017  . Cancer of  ascending colon ([place]) 09/05/2017  . Dyspnea 09/01/2017  . Blood loss anemia 08/30/2017  . Closed displaced fracture of right femoral neck ([place]) 03/22/2017  . Cough 01/17/2017  . Persistent atrial fibrillation ([place]) 01/17/2017  . Leg swelling 09/26/2016  . GI bleed 05/15/2016  . UGI bleed 05/14/2016  . COPD (chronic obstructive pulmonary disease) ([place]) 05/14/2016  . Chronic GI bleeding 04/12/2016  . HLD (hyperlipidemia) 04/12/2016  . Anemia due to chronic blood loss 04/12/2016  . Diabetes mellitus with complication ([place])   . BPPV (benign paroxysmal positional vertigo) 01/27/2016  . Hypertension 01/27/2016  . Malignant neoplasm of prostate ([place]) 01/07/2016  . Bipolar I disorder ([place])   . Bright red rectal bleeding 12/16/2015  . Rectal bleeding 12/16/2015  . Pulmonary hypertension ([place]) 06/29/2015  . Moderate aortic stenosis 04/17/2013  . Encounter for monitoring Coumadin therapy 04/17/2013  . Anemia, iron deficiency 04/04/2013  . Dyspnea on exertion 11/18/2012  . Obesity hypoventilation syndrome ([place]) 09/19/2012  . ALLERGIC RHINITIS 06/24/2009  . DM2 (diabetes mellitus, type 2) ([place]) 03/13/2007  . OSA (obstructive sleep apnea) 03/13/2007  . COPD mixed type ([place]) 03/13/2007    Past Surgical History:  Procedure Laterality Date  . BIOPSY  09/02/2017   Procedure: BIOPSY;  Surgeon: Ronald Lobo, MD;  Location: [facility] ENDOSCOPY;  Service: Endoscopy;;  . CARDIOVERSION  yrs ago   prior to 1998  . COLON RESECTION N/A 09/05/2017   Procedure: LAPAROSCOPIC RIGHT COLON RESECTION;  Surgeon: Fanny Skates, MD;  Location: [facility] ORS;  Service: General;  Laterality: N/A;  . COLONOSCOPY N/A 09/02/2017   Procedure: COLONOSCOPY;  Surgeon: Ronald Lobo, MD;  [facility]: [facility];  Service: Endoscopy;  Laterality: N/A;  . COLONOSCOPY WITH PROPOFOL N/A 01/23/2013   Procedure: COLONOSCOPY WITH PROPOFOL;  Surgeon: Garlan Fair, MD;  [facility]: [facility];  Service: Endoscopy;  Laterality: N/A;  .  electro shock  1969   for depression  . ENTEROSCOPY Left 05/17/2016   Procedure: ENTEROSCOPY;  Surgeon: Wilford Corner, MD;  [facility];  Service: Endoscopy;  Laterality: Left;  . ESOPHAGOGASTRODUODENOSCOPY N/A 03/03/2016   Procedure: ESOPHAGOGASTRODUODENOSCOPY (EGD);  Surgeon: Teena Irani, MD;  Location: [facility];  Service: Endoscopy;  Laterality: N/A;  . ESOPHAGOGASTRODUODENOSCOPY N/A 03/26/2016   Procedure: ESOPHAGOGASTRODUODENOSCOPY (EGD);  Surgeon: Teena Irani, MD;  Location: [facility] ENDOSCOPY;  Service: Endoscopy;  Laterality: N/A;  would like to use ultraslim scope  . ESOPHAGOGASTRODUODENOSCOPY N/A 08/31/2017   Procedure: ESOPHAGOGASTRODUODENOSCOPY (EGD);  Surgeon: Laurence Spates, MD;  Location: [facility];  Service: Endoscopy;  Laterality: N/A;  . ESOPHAGOGASTRODUODENOSCOPY (EGD) WITH PROPOFOL N/A 01/23/2013   Procedure: ESOPHAGOGASTRODUODENOSCOPY (EGD) WITH PROPOFOL;  Surgeon: Garlan Fair, MD;  [facility];  Service: Endoscopy;  Laterality: N/A;  . ESOPHAGOGASTRODUODENOSCOPY (EGD) WITH PROPOFOL N/A 04/13/2016   Procedure: ESOPHAGOGASTRODUODENOSCOPY (EGD) WITH PROPOFOL;  Surgeon: Otis Brace, MD;  Location: [facility];  Service: Gastroenterology;  Laterality: N/A;  . GIVENS CAPSULE STUDY N/A 03/03/2016   Procedure: GIVENS CAPSULE STUDY;  Surgeon: Teena Irani, MD;  [facility];  Service: Endoscopy;  Laterality: N/A;  . HEMORRHOID SURGERY N/A 12/16/2015   Procedure: PROCTOSCOPY WITH CONTROL OF BLEEDING;  Surgeon: Fanny Skates, MD;  Location: [facility];  Service: General;  Laterality: N/A;  . HIP PINNING,CANNULATED Right 03/23/2017   Procedure: CANNULATED HIP PINNING;  Surgeon: Marybelle Killings, MD;  [facility];  Service: Orthopedics;  Laterality: Right;  . KNEE SURGERY  1970's   rt  . LAPAROTOMY N/A 09/10/2017   Procedure: EXPLORATORY LAPAROTOMY, PARTIAL COLECTOMY;  Surgeon: Excell Seltzer, MD;  Location[facility];  Service: General;  Laterality:  N/A;  . [place]  . POLYPECTOMY  09/02/2017   Procedure: POLYPECTOMY;  Surgeon: Ronald Lobo, MD;  [facility];  Service: Endoscopy;;  . PROSTATE BIOPSY    . SHOULDER SURGERY  7-8 yrs ago   rt       Family History  Problem Relation Age of Onset  . Tuberculosis Maternal Grandmother   . Heart attack Neg Hx   . Diabetes Neg Hx   . CAD Neg Hx   . Cancer Neg Hx     Social History   Tobacco Use  . Smoking status: Former Smoker    Packs/day: 2.00    Years: 25.00    Pack years: 50.00    Types: Cigarettes    Quit date: 07/08/1976    Years since quitting: 43.1  . Smokeless tobacco: Never Used  Vaping Use  . Vaping Use: Never used  Substance Use Topics  . Alcohol use: No    Comment: in AA since 1977, no alcohol since 1977  . Drug use: No    Types: Marijuana    Comment: marijuana use 37 years ago    Home Medications  Prior to Admission medications   Medication Sig Start Date End Date Taking? Authorizing Provider  acetaminophen (TYLENOL) 325 MG tablet Take 650 mg by mouth every 6 (six) hours as needed for mild pain.   Yes [provider]  allopurinol (ZYLOPRIM) 100 MG tablet Take 100 mg by mouth daily.    Yes [provider]  antiseptic oral rinse (BIOTENE) LIQD 15 mLs by Mouth Rinse route every 4 (four) hours as needed for dry mouth.   Yes [provider]  ARIPiprazole (ABILIFY) 2 MG tablet Take 2 mg by mouth daily.   Yes [provider]  bisacodyl (DULCOLAX) 10 MG suppository Place 10 mg rectally daily as needed for moderate constipation.   Yes [provider]  buPROPion (WELLBUTRIN XL) 300 MG 24 hr tablet Take 300 mg by mouth daily.   Yes [provider]  citalopram (CELEXA) 20 MG tablet Take 1 tablet (20 mg total) by mouth daily. 09/17/17  Yes Lavina Hamman, MD  diltiazem (CARDIZEM CD) 120 MG 24 hr capsule Take 1 capsule (120 mg total) by mouth daily. 09/17/17  Yes Lavina Hamman, MD  docusate sodium (COLACE)  100 MG capsule Take 100 mg by mouth 2 (two) times daily.   Yes [provider]  fentaNYL (DURAGESIC) 12 MCG/HR Place 1 patch onto the skin every 3 (three) days.   Yes [provider]  ferrous sulfate 325 (65 FE) MG tablet Take 325 mg by mouth daily with breakfast.   Yes [provider]  fluticasone (FLONASE) 50 MCG/ACT nasal spray Place 2 sprays into both nostrils daily. 01/27/16  Yes Waynetta Pean, PA-C  furosemide (LASIX) 40 MG tablet Take 40 mg by mouth daily. 08/03/19  Yes [provider]  gabapentin (NEURONTIN) 100 MG capsule Take 2 capsules (200 mg total) by mouth 2 (two) times daily. 09/16/17  Yes Lavina Hamman, MD  gabapentin (NEURONTIN) 600 MG tablet Take 600 mg by mouth at bedtime.   Yes [provider]  glimepiride (AMARYL) 2 MG tablet Take 2 mg by mouth daily with breakfast.   Yes [provider]  guaifenesin (HUMIBID E) 400 MG TABS tablet Take 400 mg by mouth every 12 (twelve) hours as needed (Congestion).   Yes [provider]  guaiFENesin (MUCINEX) 600 MG 12 hr tablet Take 600 mg by mouth 2 (two) times daily.   Yes [provider]  insulin detemir (LEVEMIR) 100 unit/ml SOLN Inject 15 Units into the skin at bedtime.   Yes [provider]  levalbuterol (XOPENEX) 0.63 MG/3ML nebulizer solution Take 0.63 mg by nebulization 3 (three) times daily.   Yes [provider]  levofloxacin (LEVAQUIN) 500 MG tablet Take 500 mg by mouth daily. For 10 days. 08/03/19 08/12/19 Yes [provider]  lovastatin (MEVACOR) 20 MG tablet Take 20 mg by mouth at bedtime.    Yes [provider]  meclizine (ANTIVERT) 25 MG tablet Take 1 tablet (25 mg total) by mouth 3 (three) times daily as needed for dizziness. Patient taking differently: Take 25 mg by mouth every 8 (eight) hours as needed for dizziness.  01/28/16  Yes Eber Jones, MD  metolazone (ZAROXOLYN) 5 MG tablet Take 5 mg by mouth 2 (two)  times a week. On Tuesday and Friday   Yes [provider]  Multiple Vitamins-Minerals (VISION FORMULA PO) Take 1 tablet by mouth daily.    Yes [provider]  nystatin (MYCOSTATIN) 100000 UNIT/ML suspension Take 5 mLs by mouth 3 (  three) times daily.   Yes [provider]  pantoprazole (PROTONIX) 40 MG tablet Take 1 tablet (40 mg total) by mouth 2 (two) times daily. 09/16/17  Yes Lavina Hamman, MD  polyethylene glycol (MIRALAX / GLYCOLAX) 17 g packet Take 17 g by mouth daily as needed for mild constipation.   Yes [provider]  potassium chloride SA (K-DUR,KLOR-CON) 20 MEQ tablet Take 40 mEq by mouth 2 (two) times daily.  09/10/14  Yes [provider]  simethicone (MYLICON) 80 MG chewable tablet Chew 80 mg by mouth 3 (three) times daily with meals.   Yes [provider]  sodium chloride (OCEAN) 0.65 % SOLN nasal spray Place 1 spray into both nostrils every 2 (two) hours. As needed for dryness.   Yes [provider]  temazepam (RESTORIL) 7.5 MG capsule Take 7.5 mg by mouth at bedtime.   Yes [provider]  tiZANidine (ZANAFLEX) 2 MG tablet Take 2 mg by mouth in the morning and at bedtime.   Yes [provider]  traMADol (ULTRAM) 50 MG tablet Take 100 mg by mouth 4 (four) times daily.   Yes [provider]  buPROPion (WELLBUTRIN XL) 150 MG 24 hr tablet Take 1 tablet (150 mg total) by mouth daily. Patient not taking: Reported on 08/13/2019 12/18/15   Ambrose Finland, MD  furosemide (LASIX) 80 MG tablet Take 1 tablet (80 mg total) by mouth daily. Patient not taking: Reported on 08/19/2019 09/17/17   Lavina Hamman, MD  OXYGEN Inhale 2 L into the lungs as needed (shortness of breath).     [provider]  temazepam (RESTORIL) 15 MG capsule Take 1 capsule (15 mg total) by mouth at bedtime. Patient not taking: Reported on 08/04/2019 03/27/17   Roxan Hockey, MD    Allergies    Penicillins;  Antihistamines, loratadine-type; and Other  Review of Systems   Review of Systems  Unable to assess due to mental status.   Physical Exam Updated Vital Signs BP 136/64   Pulse (!) 101   Temp (!) 102.7 F (39.3 C) (Rectal)   Resp (!) 30   Wt 95.3 kg   SpO2 100%   BMI 28.49 kg/m   Physical Exam Vitals and nursing note reviewed.  Constitutional:      Appearance: Normal appearance.  HENT:     Head: Normocephalic and atraumatic.     Nose: Nose normal.     Mouth/Throat:     Mouth: Mucous membranes are moist.  Eyes:     Extraocular Movements: Extraocular movements intact.     Conjunctiva/sclera: Conjunctivae normal.  Cardiovascular:     Rate and Rhythm: Tachycardia present. Rhythm irregular.  Pulmonary:     Effort: Tachypnea, accessory muscle usage and respiratory distress present.     Breath sounds: Rhonchi present.  Abdominal:     General: Abdomen is flat.     Palpations: Abdomen is soft.     Tenderness: There is no abdominal tenderness.  Musculoskeletal:        General: Swelling (trace edema BLE, symmetric) present. Normal range of motion.     Cervical back: Neck supple.     Comments: Erythema and chronic skin changes to BLE, R>L, mild warmth  Skin:    General: Skin is warm and dry.  Neurological:     Mental Status: He is alert.     Comments: Unresponsive to verbal or painful stimuli  Psychiatric:     Comments: Unable to assess  ED Results / Procedures / Treatments   Labs (all labs ordered are listed, but only abnormal results are displayed) Labs Reviewed  COMPREHENSIVE METABOLIC PANEL - Abnormal; Notable for the following components:      Result Value   Potassium 3.4 (*)    BUN 41 (*)    Creatinine, Ser 1.71 (*)    Calcium 8.6 (*)    Albumin 3.0 (*)    AST 50 (*)    GFR calc non Af Amer 36 (*)    GFR calc Af Amer 42 (*)    All other components within normal limits  CBC WITH DIFFERENTIAL/PLATELET - Abnormal; Notable for the following components:    WBC 10.8 (*)    RBC 3.01 (*)    Hemoglobin 8.7 (*)    HCT 30.0 (*)    MCHC 29.0 (*)    RDW 16.4 (*)    Neutro Abs 9.8 (*)    Lymphs Abs 0.3 (*)    All other components within normal limits  BRAIN NATRIURETIC PEPTIDE - Abnormal; Notable for the following components:   B Natriuretic Peptide 328.3 (*)    All other components within normal limits  CBG MONITORING, ED - Abnormal; Notable for the following components:   Glucose-Capillary 109 (*)    All other components within normal limits  I-STAT ARTERIAL BLOOD GAS, ED - Abnormal; Notable for the following components:   pH, Arterial 7.543 (*)    pO2, Arterial 152 (*)    Bicarbonate 35.4 (*)    TCO2 37 (*)    Acid-Base Excess 12.0 (*)    Calcium, Ion 1.11 (*)    HCT 27.0 (*)    Hemoglobin 9.2 (*)    All other components within normal limits  TROPONIN I (HIGH SENSITIVITY) - Abnormal; Notable for the following components:   Troponin I (High Sensitivity) 345 (*)    All other components within normal limits  CULTURE, BLOOD (ROUTINE X 2)  CULTURE, BLOOD (ROUTINE X 2)  SARS CORONAVIRUS 2 BY RT PCR (HOSPITAL ORDER, [facility])  LACTIC ACID, PLASMA  LACTIC ACID, PLASMA  URINALYSIS, ROUTINE W REFLEX MICROSCOPIC  CBG MONITORING, ED  TROPONIN I (HIGH SENSITIVITY)    EKG EKG Interpretation  Date/Time:  Sunday August 05 2019 06:58:25 EDT Ventricular Rate:  121 PR Interval:    QRS Duration: 102 QT Interval:  267 QTC Calculation: 379 R Axis:   71 Text Interpretation: Atrial fibrillation Repol abnrm suggests ischemia, diffuse leads No significant change since last tracing Confirmed by Calvert Cantor [phone]) on 08/19/2019 7:18:39 AM   Radiology DG Chest 1 View  Result Date: 08/06/2019 CLINICAL DATA:  Shortness of breath starting this morning EXAM: CHEST  1 VIEW COMPARISON:  January 23, 2018 FINDINGS: The mediastinal contour is normal. The heart size is mildly enlarged. Pulmonary increased interstitium with the  enlarged central pulmonary vessel caliber are identified bilaterally. No focal pneumonia or pleural effusion is noted. Bony structures are stable. IMPRESSION: congestive heart failure. Electronically Signed   By: Abelardo Diesel M.D.   On: 08/14/2019 07:24    Procedures .Critical Care Performed by: Truddie Hidden, MD Authorized by: Truddie Hidden, MD   Critical care provider statement:    Critical care time (minutes):  50   Critical care time was exclusive of:  Separately billable procedures and treating other patients   Critical care was necessary to treat or prevent imminent or life-threatening deterioration of the following conditions:  Respiratory failure and sepsis  Critical care was time spent personally by me on the following activities:  Development of treatment plan with patient or surrogate, discussions with consultants, evaluation of patient's response to treatment, examination of patient, ordering and performing treatments and interventions, ordering and review of laboratory studies, ordering and review of radiographic studies, pulse oximetry, re-evaluation of patient's condition and review of old charts   (including critical care time)  Medications Ordered in ED Medications  cefTRIAXone (ROCEPHIN) 1 g in sodium chloride 0.9 % 100 mL IVPB (1 g Intravenous New Bag/Given 08/20/2019 0920)  azithromycin (ZITHROMAX) 500 mg in sodium chloride 0.9 % 250 mL IVPB (has no administration in time range)  albuterol (PROVENTIL) (2.5 MG/3ML) 0.083% nebulizer solution 5 mg (5 mg Nebulization Given 08/24/2019 0719)  ipratropium (ATROVENT) nebulizer solution 0.5 mg (0.5 mg Nebulization Given 08/12/2019 0720)  furosemide (LASIX) injection 80 mg (80 mg Intravenous Given 08/06/2019 0920)    ED Course  I have reviewed the triage vital signs and the nursing notes.  Pertinent labs & imaging results that were available during my care of the patient were reviewed by me and considered in my medical decision  making (see chart for details).  Clinical Course as of Aug 05 939  Sun Aug 05, 2019  0726 Spoke with the patient's daughter who is out of town and on [place] the [place]. She reports the patient was previously living in an independent living apartment but began running a fever and became less responsive so he was moved to the SNF part of the facility. She is unaware of his medications being stopped and this may be an artifact of moving from one section of the facility to another. He was started on Abx for pneumonia after a CXR was done there a few days ago. He was also vomiting prir to this so there was a concern for aspiration.    [CS]  0731 Patient's work of breathing improved with BiPAP.    [CS]  0745 CBC with anemia at baseline, WBC is mildly elevated.    [CS]  7619 CXR with findings more consistent with CHF than pneumonia. Will correlate with BNP to help guide ED treatment.    [CS]  0804 Lactic acid is normal.   [CS]  0816 ABG shows a partially compensated metabolic alkalosis.   [CS]  [phone] CMP shows acute on chronic kidney disease, Trop is elevated.    [CS]  2671 BNP mildly elevated. Will give a dose of Lasix and discuss with Cardiology   [CS]  [phone] Spoke with Dr. Percival Spanish, Cardiology who agrees with plan to admit to hospitalist for CHF exacerbation and acute on chronic respiratory failure.    [CS]  [id number] Patient still not responsive to verbal stimuli. Will send for head CT.    [CS]  0901 Patient's rectal temp noted to be 102.32F. CAP abx ordered, added UA. Pharmacy reports he has tolerated cephalosporins in that past despite PCN allergy.    [CS]  0913 Spoke with Dr. Roosevelt Locks, Hospitalist who will evaluate the patient for admission.    [CS]    Clinical Course User Index [CS] Truddie Hidden, MD   MDM Rules/Calculators/A&P                          Patient from SNF with AMS, hypoglycemia and hypoxia has increased WOB and tachypnea on initial presentation. He has a DNR and  MOST form indicating no CPR and comfort measures only, although  will allow IV fluids and Abx if needed. He has an extensive MAR from SNF although most of his medications indicate they were d/c'd on 7/1. I was unable to contact anyone at [facility] to confirm. He is on Levaquin 500mg , presumably for his LE erythema/cellulitis. Will place on BiPAP for work of breathing, give a neb and check labs/CXR.  Final Clinical Impression(s) / ED Diagnoses Final diagnoses:  Acute on chronic respiratory failure with hypoxia ([place])  Acute pulmonary edema ([place])  Community acquired pneumonia, unspecified laterality  Elevated troponin I level  Hypoglycemia    Rx / DC Orders ED Discharge Orders    None       Truddie Hidden, MD 08/04/2019 [phone]

## 2019-08-05 NOTE — ED Notes (Signed)
BIPAP applied

## 2019-08-05 NOTE — H&P (Signed)
History and Physical    Joel [name] YPP:[id number] DOB: 1933-01-21 DOA: 08/28/2019  PCP: Wenda Low, MD (Confirm with patient/family/NH records and if not entered, this has to be entered at [facility] point of entry) Patient coming from: SNF  I have personally briefly reviewed patient's old medical records in [place]  Chief Complaint: AMS  HPI: Joel [name] is a 86 y.o. male with medical history significant of chronic A. fib, hypertension, bipolar, diastolic CHF chronic, moderate aortic stenosis, chronic O2 dependent respiratory failure, COPD, chronic back and neck pain, diabetic neuropathy, NH resident from [facility] presented for AMS/unresponsive.  Patient is in coma, and unable to reach nursing/staff at nursing home, most of the history provided by patient daughter and ER staff.  Patient baseline is awake alert, chronic O2 dependent respiratory failure on 2 L oxygen.  Patient daughter was contacted 3 days ago about the patient has been having frequent nausea vomited and fever, and pneumonia was suspected and patient was started on Levaquin.  This morning, patient was found unresponsive on the floor, his sugar was 23 and hypoxic.  Patient was given intramuscular glucagon and put on 4 L oxygen. ED Course: Patient received Narcan x1, sugar more stabilized however patient still not waking up.  Glucose 88, BUN 41, creatinine 1.7, potassium 3.4, bicarb 30, WBC 10.8, blood gas 7.45/41/152.  Chest x-ray showed pulmonary edema.  Review of Systems: unable to perform patient in coma Unacceptable ROS statements: "10 systems reviewed," "Extensive" (without elaboration).  Acceptable ROS statements: "All others negative," "All others reviewed and are negative," and "All others unremarkable," with at [place] documented Can't double dip - if using for HPI can't use for ROS  Past Medical History:  Diagnosis Date  . Allergic rhinitis   . Anemia   . Anxiety   . Aortic stenosis 2012    mild to mod   . Atrial fibrillation ([place]) 1991   with multiple DCCV  . Bipolar affective disorder ([place])   . Cancer of ascending colon ([place]) 09/05/2017  . CHF (congestive heart failure) ([place]) 08/30/2017  . COPD (chronic obstructive pulmonary disease) ([place])   . Diabetic neuropathy ([place])    in feet  . Diabetic neuropathy ([place])   . Dyslipidemia   . Fatigue    last year or so  . Gout   . Hypertension   . Insomnia   . Obesity   . On home oxygen therapy 2 1/2 liters with bipap at night  . OSA (obstructive sleep apnea)    severe, uses bipap 16 1/2 by 12 or 13 setting  . Prostate cancer ([place])   . Rectal bleeding 12/16/2015  . Type 2 diabetes mellitus ([place])   . Vertigo     Past Surgical History:  Procedure Laterality Date  . BIOPSY  09/02/2017   Procedure: BIOPSY;  Surgeon: Ronald Lobo, MD;  Location: [facility];  Service: Endoscopy;;  . CARDIOVERSION  yrs ago   prior to 1998  . COLON RESECTION N/A 09/05/2017   Procedure: LAPAROSCOPIC RIGHT COLON RESECTION;  Surgeon: Fanny Skates, MD;  Location: [facility];  Service: General;  Laterality: N/A;  . COLONOSCOPY N/A 09/02/2017   Procedure: COLONOSCOPY;  Surgeon: Ronald Lobo, MD;  [facility]: [facility];  Service: Endoscopy;  Laterality: N/A;  . COLONOSCOPY WITH PROPOFOL N/A 01/23/2013   Procedure: COLONOSCOPY WITH PROPOFOL;  Surgeon: Garlan Fair, MD;  Location: [facility];  Service: Endoscopy;  Laterality: N/A;  . electro shock  1969  for depression  . ENTEROSCOPY Left 05/17/2016   Procedure: ENTEROSCOPY;  Surgeon: Wilford Corner, MD;  Location: [facility];  Service: Endoscopy;  Laterality: Left;  . ESOPHAGOGASTRODUODENOSCOPY N/A 03/03/2016   Procedure: ESOPHAGOGASTRODUODENOSCOPY (EGD);  Surgeon: Teena Irani, MD;  Location[facility];  Service: Endoscopy;  Laterality: N/A;  . ESOPHAGOGASTRODUODENOSCOPY N/A 03/26/2016   Procedure: ESOPHAGOGASTRODUODENOSCOPY (EGD);  Surgeon: Teena Irani, MD;  Location: [facility] ENDOSCOPY;  Service:  Endoscopy;  Laterality: N/A;  would like to use ultraslim scope  . ESOPHAGOGASTRODUODENOSCOPY N/A 08/31/2017   Procedure: ESOPHAGOGASTRODUODENOSCOPY (EGD);  Surgeon: Laurence Spates, MD;  Location: [facility];  Service: Endoscopy;  Laterality: N/A;  . ESOPHAGOGASTRODUODENOSCOPY (EGD) WITH PROPOFOL N/A 01/23/2013   Procedure: ESOPHAGOGASTRODUODENOSCOPY (EGD) WITH PROPOFOL;  Surgeon: Garlan Fair, MD;  [facility];  Service: Endoscopy;  Laterality: N/A;  . ESOPHAGOGASTRODUODENOSCOPY (EGD) WITH PROPOFOL N/A 04/13/2016   Procedure: ESOPHAGOGASTRODUODENOSCOPY (EGD) WITH PROPOFOL;  Surgeon: Otis Brace, MD;  Location: [place];  Service: Gastroenterology;  Laterality: N/A;  . GIVENS CAPSULE STUDY N/A 03/03/2016   Procedure: GIVENS CAPSULE STUDY;  Surgeon: Teena Irani, MD;  [facility];  Service: Endoscopy;  Laterality: N/A;  . HEMORRHOID SURGERY N/A 12/16/2015   Procedure: PROCTOSCOPY WITH CONTROL OF BLEEDING;  Surgeon: Fanny Skates, MD;  Location: [place];  Service: General;  Laterality: N/A;  . HIP PINNING,CANNULATED Right 03/23/2017   Procedure: CANNULATED HIP PINNING;  Surgeon: Marybelle Killings, MD;  Location: [facility];  Service: Orthopedics;  Laterality: Right;  . KNEE SURGERY  1970's   rt  . LAPAROTOMY N/A 09/10/2017   Procedure: EXPLORATORY LAPAROTOMY, PARTIAL COLECTOMY;  Surgeon: Excell Seltzer, MD;  [facility];  Service: General;  Laterality: N/A;  . [place]  . POLYPECTOMY  09/02/2017   Procedure: POLYPECTOMY;  Surgeon: Ronald Lobo, MD;  [facility];  Service: Endoscopy;;  . PROSTATE BIOPSY    . SHOULDER SURGERY  7-8 yrs ago   rt     reports that he quit smoking about 43 years ago. His smoking use included cigarettes. He has a 50.00 pack-year smoking history. He has never used smokeless tobacco. He reports that he does not drink alcohol and does not use drugs.  Allergies  Allergen Reactions  . Penicillins Anaphylaxis    Has patient  had a PCN reaction causing immediate rash, facial/tongue/throat swelling, SOB or lightheadedness with hypotension: unknown Has patient had a PCN reaction causing severe rash involving mucus membranes or skin necrosis: unknown Has patient had a PCN reaction that required hospitalization: unknown Has patient had a PCN reaction occurring within the last 10 years: no If all of the above answers are "NO", then may proceed with Cephalosporin use.   Marland Kitchen Antihistamines, Loratadine-Type Other (See Comments)    depression  . Other     Beta blockers-Novacaine  Caused depression    Family History  Problem Relation Age of Onset  . Tuberculosis Maternal Grandmother   . Heart attack Neg Hx   . Diabetes Neg Hx   . CAD Neg Hx   . Cancer Neg Hx      Prior to Admission medications   Medication Sig Start Date End Date Taking? Authorizing Provider  acetaminophen (TYLENOL) 325 MG tablet Take 650 mg by mouth every 6 (six) hours as needed for mild pain.   Yes [provider]  allopurinol (ZYLOPRIM) 100 MG tablet Take 100 mg by mouth daily.    Yes [provider]  antiseptic oral rinse (BIOTENE) LIQD 15 mLs by  Mouth Rinse route every 4 (four) hours as needed for dry mouth.   Yes [provider]  ARIPiprazole (ABILIFY) 2 MG tablet Take 2 mg by mouth daily.   Yes [provider]  bisacodyl (DULCOLAX) 10 MG suppository Place 10 mg rectally daily as needed for moderate constipation.   Yes [provider]  buPROPion (WELLBUTRIN XL) 300 MG 24 hr tablet Take 300 mg by mouth daily.   Yes [provider]  citalopram (CELEXA) 20 MG tablet Take 1 tablet (20 mg total) by mouth daily. 09/17/17  Yes Lavina Hamman, MD  diltiazem (CARDIZEM CD) 120 MG 24 hr capsule Take 1 capsule (120 mg total) by mouth daily. 09/17/17  Yes Lavina Hamman, MD  docusate sodium (COLACE) 100 MG capsule Take 100 mg by mouth 2 (two) times daily.   Yes [provider]  fentaNYL  (DURAGESIC) 12 MCG/HR Place 1 patch onto the skin every 3 (three) days.   Yes [provider]  ferrous sulfate 325 (65 FE) MG tablet Take 325 mg by mouth daily with breakfast.   Yes [provider]  fluticasone (FLONASE) 50 MCG/ACT nasal spray Place 2 sprays into both nostrils daily. 01/27/16  Yes Waynetta Pean, PA-C  furosemide (LASIX) 40 MG tablet Take 40 mg by mouth daily. 08/03/19  Yes [provider]  gabapentin (NEURONTIN) 100 MG capsule Take 2 capsules (200 mg total) by mouth 2 (two) times daily. 09/16/17  Yes Lavina Hamman, MD  gabapentin (NEURONTIN) 600 MG tablet Take 600 mg by mouth at bedtime.   Yes [provider]  glimepiride (AMARYL) 2 MG tablet Take 2 mg by mouth daily with breakfast.   Yes [provider]  guaifenesin (HUMIBID E) 400 MG TABS tablet Take 400 mg by mouth every 12 (twelve) hours as needed (Congestion).   Yes [provider]  guaiFENesin (MUCINEX) 600 MG 12 hr tablet Take 600 mg by mouth 2 (two) times daily.   Yes [provider]  insulin detemir (LEVEMIR) 100 unit/ml SOLN Inject 15 Units into the skin at bedtime.   Yes [provider]  levalbuterol (XOPENEX) 0.63 MG/3ML nebulizer solution Take 0.63 mg by nebulization 3 (three) times daily.   Yes [provider]  levofloxacin (LEVAQUIN) 500 MG tablet Take 500 mg by mouth daily. For 10 days. 08/03/19 08/12/19 Yes [provider]  lovastatin (MEVACOR) 20 MG tablet Take 20 mg by mouth at bedtime.    Yes [provider]  meclizine (ANTIVERT) 25 MG tablet Take 1 tablet (25 mg total) by mouth 3 (three) times daily as needed for dizziness. Patient taking differently: Take 25 mg by mouth every 8 (eight) hours as needed for dizziness.  01/28/16  Yes Eber Jones, MD  metolazone (ZAROXOLYN) 5 MG tablet Take 5 mg by mouth 2 (two) times a week. On Tuesday and Friday   Yes [provider]  Multiple Vitamins-Minerals  (VISION FORMULA PO) Take 1 tablet by mouth daily.    Yes [provider]  nystatin (MYCOSTATIN) 100000 UNIT/ML suspension Take 5 mLs by mouth 3 (three) times daily.   Yes [provider]  pantoprazole (PROTONIX) 40 MG tablet Take 1 tablet (40 mg total) by mouth 2 (two) times daily. 09/16/17  Yes Lavina Hamman, MD  polyethylene glycol (MIRALAX / GLYCOLAX) 17 g packet Take 17 g by mouth daily as needed for mild constipation.   Yes [provider]  potassium chloride SA (K-DUR,KLOR-CON) 20 MEQ tablet Take  40 mEq by mouth 2 (two) times daily.  09/10/14  Yes [provider]  simethicone (MYLICON) 80 MG chewable tablet Chew 80 mg by mouth 3 (three) times daily with meals.   Yes [provider]  sodium chloride (OCEAN) 0.65 % SOLN nasal spray Place 1 spray into both nostrils every 2 (two) hours. As needed for dryness.   Yes [provider]  temazepam (RESTORIL) 7.5 MG capsule Take 7.5 mg by mouth at bedtime.   Yes [provider]  tiZANidine (ZANAFLEX) 2 MG tablet Take 2 mg by mouth in the morning and at bedtime.   Yes [provider]  traMADol (ULTRAM) 50 MG tablet Take 100 mg by mouth 4 (four) times daily.   Yes [provider]  buPROPion (WELLBUTRIN XL) 150 MG 24 hr tablet Take 1 tablet (150 mg total) by mouth daily. Patient not taking: Reported on 08/16/2019 12/18/15   Ambrose Finland, MD  furosemide (LASIX) 80 MG tablet Take 1 tablet (80 mg total) by mouth daily. Patient not taking: Reported on 08/29/2019 09/17/17   Lavina Hamman, MD  OXYGEN Inhale 2 L into the lungs as needed (shortness of breath).     [provider]  temazepam (RESTORIL) 15 MG capsule Take 1 capsule (15 mg total) by mouth at bedtime. Patient not taking: Reported on 08/16/2019 03/27/17   Roxan Hockey, MD    Physical Exam: Vitals:   08/25/2019 0758 08/25/2019 0856 08/23/2019 1028 08/28/2019 1029  BP:      Pulse:    (!) 32  Resp:    (!) 31   Temp:  (!) 102.7 F (39.3 C)    TempSrc:  Rectal    SpO2:   100% 99%  Weight: 95.3 kg       Constitutional: NAD, calm, comfortable Vitals:   08/19/2019 0758 08/30/2019 0856 08/23/2019 1028 08/08/2019 1029  BP:      Pulse:    (!) 32  Resp:    (!) 31  Temp:  (!) 102.7 F (39.3 C)    TempSrc:  Rectal    SpO2:   100% 99%  Weight: 95.3 kg      Eyes: Nystagmus about 1 Hz ENMT: Mucous membranes are moist. Posterior pharynx clear of any exudate or lesions.Normal dentition.  Neck: normal, supple, no masses, no thyromegaly Respiratory: clear to auscultation bilaterally, no wheezing, no crackles. Normal respiratory effort. No accessory muscle use.  Cardiovascular: Irregular heart rate, diminished S1, loud systolic murmur on heart base. No extremity edema. 2+ pedal pulses. No carotid bruits.  Abdomen: no tenderness, no masses palpated. No hepatosplenomegaly. Bowel sounds positive.  Musculoskeletal: no clubbing / cyanosis. No joint deformity upper and lower extremities. Good ROM, no contractures. Normal muscle tone.  Skin: Bilateral lower extremity cellulitis changes Neurologic: Eyes are open, not responding to voice or physical stimulus including sternal rub, occasional jerking movement of bilateral lower extremities.  Diminished deep tendon reflexes, negative of myoclonus and pathology, retractions Psychiatric: Comatose    Labs on Admission: I have personally reviewed following labs and imaging studies  CBC: Recent Labs  Lab 08/11/2019 0715 08/06/2019 0804  WBC 10.8*  --   NEUTROABS 9.8*  --   HGB 8.7* 9.2*  HCT 30.0* 27.0*  MCV 99.7  --   PLT 166  --    Basic Metabolic Panel: Recent Labs  Lab 08/27/2019 0715 09/01/2019 0804  NA 141 145  K 3.4* 3.6  CL 100  --   CO2 30  --  GLUCOSE 88  --   BUN 41*  --   CREATININE 1.71*  --   CALCIUM 8.6*  --    GFR: Estimated Creatinine Clearance: 38.5 mL/min (A) (by C-G formula based on SCr of 1.71 mg/dL (H)). Liver Function Tests: Recent  Labs  Lab 08/08/2019 0715  AST 50*  ALT 19  ALKPHOS 76  BILITOT 0.8  PROT 6.7  ALBUMIN 3.0*   No results for input(s): LIPASE, AMYLASE in the last 168 hours. No results for input(s): AMMONIA in the last 168 hours. Coagulation Profile: No results for input(s): INR, PROTIME in the last 168 hours. Cardiac Enzymes: No results for input(s): CKTOTAL, CKMB, CKMBINDEX, TROPONINI in the last 168 hours. BNP (last 3 results) No results for input(s): PROBNP in the last 8760 hours. HbA1C: No results for input(s): HGBA1C in the last 72 hours. CBG: Recent Labs  Lab 08/02/2019 0654 08/09/2019 0706  GLUCAP 109* 89   Lipid Profile: No results for input(s): CHOL, HDL, LDLCALC, TRIG, CHOLHDL, LDLDIRECT in the last 72 hours. Thyroid Function Tests: No results for input(s): TSH, T4TOTAL, FREET4, T3FREE, THYROIDAB in the last 72 hours. Anemia Panel: No results for input(s): VITAMINB12, FOLATE, FERRITIN, TIBC, IRON, RETICCTPCT in the last 72 hours. Urine analysis:    Component Value Date/Time   COLORURINE YELLOW 08/27/2019 1003   APPEARANCEUR CLEAR 09/01/2019 1003   LABSPEC 1.011 08/27/2019 1003   PHURINE 5.0 08/04/2019 1003   GLUCOSEU NEGATIVE 08/09/2019 1003   HGBUR NEGATIVE 08/31/2019 1003   BILIRUBINUR NEGATIVE 08/31/2019 1003   KETONESUR NEGATIVE 08/23/2019 1003   PROTEINUR NEGATIVE 08/06/2019 1003   UROBILINOGEN 0.2 10/22/2014 2136   NITRITE NEGATIVE 08/02/2019 1003   LEUKOCYTESUR NEGATIVE 08/27/2019 1003    Radiological Exams on Admission: DG Chest 1 View  Result Date: 08/08/2019 CLINICAL DATA:  Shortness of breath starting this morning EXAM: CHEST  1 VIEW COMPARISON:  January 23, 2018 FINDINGS: The mediastinal contour is normal. The heart size is mildly enlarged. Pulmonary increased interstitium with the enlarged central pulmonary vessel caliber are identified bilaterally. No focal pneumonia or pleural effusion is noted. Bony structures are stable. IMPRESSION: congestive heart failure.  Electronically Signed   By: Abelardo Diesel M.D.   On: 08/03/2019 07:24    EKG: Independently reviewed.  A. fib  Assessment/Plan Active Problems:   AMS (altered mental status)  (please populate well all problems here in Problem List. (For example, if patient is on BP meds at home and you resume or decide to hold them, it is a problem that needs to be her. Same for CAD, COPD, HLD and so on)  Acute metabolic/toxic encephalopathy -As of now suspect multiple factors from prolonged hypoglycemia and hypoxia, and sepsis, possible narcotic effect -Hypoglycemia improved, will start D5 normal saline at a low rate given the patient also had signs of pulmonary edema. -Hypoxia also improved with nonrebreather and BiPAP -Persistent nystagmus and clonus-like movement seems indicate significant cortical impairment. Waiting for Neuro input  Neurology -Patient is in coma, only open eyes, this makes his GCS score 6 -CT head pending.  Discussed with on-call neurologist, recommend brain MRI -Prognosis guarded given the initial response after hypoglycemia and hypoxia corrected.  Long discussion with patient daughter, who is on her way from [place].  Cardiovascular -Has signs of CHF decompensation, see 1 dose of 80 mg Lasix in the ED.  He has a history of moderate aortic stenosis on echo in 2019 -Currently patient is mild fluid overload, start very low-dose of D5 normal saline  more for his sugar than volume expansion -Echo -Chronic A. fib, rate controlled, as needed metoprolol for heart rate more than 120 with parameters on BP. -COPD seems stable  Respiratory -Probably has aspiration pneumonia, discussed with pharmacy, recommend Rocephin sole therapy for aspiration pneumonia, patient did receive 3 to 4 days of Levaquin and nursing home, will switch to doxycycline to cover atypical plus cellulitis on his legs.  GI -Not expecting patient can wake up and eat the next few days, ordered NGT tube -PPI for GI  prophylaxis  Renal -Insert Foley for accurate input and output  Endocrine -Low rate of D5 normal saline and low-dose of sliding scale  ID -2 possible sources for infection 1 days the aspiration pneumonia, plus bilateral cellulitis -Ceftriaxone and doxycycline  Heme -Chronic anemia, send iron study, GI prophylaxis for now  DVT prophylaxis: Lovenox Code Status: DNR Family Communication: Daughter over phone Disposition Plan: Overall prognosis poor, daughter informed, consider change of goal of care if patient not waking up in the next 24 to 48 hours Consults called: Neurology Admission status: *PCU   Lequita Halt MD [facility] Pager [phone]    08/23/2019, 11:00 AM

## 2019-08-05 NOTE — ED Notes (Signed)
Dr. Karle Starch present for rectal temp.

## 2019-08-05 NOTE — Progress Notes (Signed)
EEG completed, results pending. 

## 2019-08-05 NOTE — Progress Notes (Signed)
Placed patient on BiPAP per order 

## 2019-08-05 NOTE — ED Triage Notes (Signed)
Pt in via [facility] as unresponsive, hypoglycemic from  [place]. CBG 23, given D10, Glucagon IM, and 1 of Narcan en route. CBG up to 129 on ED arrival. Pt had to be bagged en route, arrives GCS 4 and on NRB. DNR and MOST form present with paperwork.

## 2019-08-05 NOTE — Procedures (Signed)
Date of Study: 08/03/2019  Reason for Study:  Pt is a 56 YOM with PMH of DM II, PROSTATE CA, OSA, HTN, A.FIB who presented with AMS. Eval for seizures.  Description of recording:  This recording was obtained using a Digital EEG system with 18 channel capacity . Standard bipolar and referential EEG montages were used following the International 10 - 20 System .   This is a digitally recorded EEG with duration of approximately ~ 23:28 minutes. There is diffuse background slowing consisting of delta rhythm without any well defined posterior dominant alpha rhythm. There were no epileptiform discharges or electrographic seizures noted. Hyperventilation was not performed. Photic stimulation not was performed. unable to interpret EKG.  IMPRESSION: This is an abnormal EEG for age due to diffuse background slowing which can be seen with toxic/metabolic encephalopathy, cerebral ischemia, intracerebral hemorrhage, tumors, traumatic brain injury, cortical malformations, or other cerebral dysfunction. There were no clinical seizures or epileptiform discharges noted during study. Clinical correlation is advised.   I have personally reviewed this study.

## 2019-08-05 NOTE — ED Notes (Signed)
Family at bedside. 

## 2019-08-05 NOTE — Consult Note (Addendum)
NEURO HOSPITALIST CONSULT NOTE   Requesting physician: Dr. Roosevelt Locks  Reason for Consult: AMS  History obtained from:  Chart review  HPI:                                                                                                                                          Joel [name] is an 86 y.o. male with a PMHx of DM, prostate cancer, severe OSA, HTN, and a fib who presented to the [facility] ED from [facility] for [place].  Per chart, the patient is a resident at [facility]. He was found unresponsive with a CBG of 23. Glucagon IM was administered by staff at the SNF. On EMS arrival, there was improved glucose level, but his mental status did not show correlating improvement. He was noted to have pinpoint pupils by EMS in the context of a fentanyl patch. The patch was removed and and Narcan was then administered; again, no improvement was noted. He was also hypoxic with SpO2 in the 70s on his usual 2L Person. He was then placed on NRB mask with no improvement in his mental status. Patient was bagged in route. His GCS was 4. Of note, the patient is DNR with a MOST form. Currently on Bipap, his sats are 100%. Pendular eye movements were noted by the Hospitalist. Neurology was consulted to further evaluate. EEG, CT and MRI were ordered.   EEG: This is an abnormal EEG for age due to diffuse background slowing which can be seen with toxic/metabolic encephalopathy, cerebral ischemia, intracerebral hemorrhage, tumors, traumatic brain injury, cortical malformations, or other cerebral dysfunction. There were no clinical seizures or epileptiform discharges noted during study.   CT head: No CT evidence of acute intracranial abnormality. There is stable, mild generalized parenchymal atrophy and chronic small vessel ischemic disease. Redemonstrated chronic lacunar infarcts within the right basal ganglia and left thalamus.   MRI: pending  UA: WNL  K+: 3.4  Creatinine 1.71  BUN: 41  Past  Medical History:  Diagnosis Date  . Allergic rhinitis   . Anemia   . Anxiety   . Aortic stenosis 2012   mild to mod   . Atrial fibrillation ([name]) 1991   with multiple DCCV  . Bipolar affective disorder ([place])   . Cancer of ascending colon ([place]) 09/05/2017  . CHF (congestive heart failure) ([place]) 08/30/2017  . COPD (chronic obstructive pulmonary disease) ([place])   . Diabetic neuropathy ([place])    in feet  . Diabetic neuropathy ([place])   . Dyslipidemia   . Fatigue    last year or so  . Gout   . Hypertension   . Insomnia   . Obesity   . On home oxygen therapy 2 1/2 liters with bipap at night  . OSA (  obstructive sleep apnea)    severe, uses bipap 16 1/2 by 12 or 13 setting  . Prostate cancer ([place])   . Rectal bleeding 12/16/2015  . Type 2 diabetes mellitus ([place])   . Vertigo     Past Surgical History:  Procedure Laterality Date  . BIOPSY  09/02/2017   Procedure: BIOPSY;  Surgeon: Ronald Lobo, MD;  Location: [facility] END[facility]COPY;  Service: Endoscopy;;  . CARDIOVERSION  yrs ago   prior to 1998  . COLON RESECTION N/A 09/05/2017   Procedure: LAPAROSCOPIC RIGHT COLON RESECTION;  Surgeon: Fanny Skates, MD;  Location: [facility] ORS;  Service: General;  Laterality: N/A;  . COLONOSCOPY N/A 09/02/2017   Procedure: COLONOSCOPY;  Surgeon: Ronald Lobo, MD;  Location: [facility];  Service: Endoscopy;  Laterality: N/A;  . COLONOSCOPY WITH PROPOFOL N/A 01/23/2013   Procedure: COLONOSCOPY WITH PROPOFOL;  Surgeon: Garlan Fair, MD;  Location: [facility];  Service: Endoscopy;  Laterality: N/A;  . electro shock  1969   for depression  . ENTEROSCOPY Left 05/17/2016   Procedure: ENTEROSCOPY;  Surgeon: Wilford Corner, MD;  Location: [facility];  Service: Endoscopy;  Laterality: Left;  . ESOPHAGOGASTRODUODENOSCOPY N/A 03/03/2016   Procedure: ESOPHAGOGASTRODUODENOSCOPY (EGD);  Surgeon: Teena Irani, MD;  [facility];  Service: Endoscopy;  Laterality: N/A;  . ESOPHAGOGASTRODUODENOSCOPY N/A 03/26/2016    Procedure: ESOPHAGOGASTRODUODENOSCOPY (EGD);  Surgeon: Teena Irani, MD;  Location: [facility] ENDOSCOPY;  Service: Endoscopy;  Laterality: N/A;  would like to use ultraslim scope  . ESOPHAGOGASTRODUODENOSCOPY N/A 08/31/2017   Procedure: ESOPHAGOGASTRODUODENOSCOPY (EGD);  Surgeon: Laurence Spates, MD;  Location: [facility];  Service: Endoscopy;  Laterality: N/A;  . ESOPHAGOGASTRODUODENOSCOPY (EGD) WITH PROPOFOL N/A 01/23/2013   Procedure: ESOPHAGOGASTRODUODENOSCOPY (EGD) WITH PROPOFOL;  Surgeon: Garlan Fair, MD;  [facility];  Service: Endoscopy;  Laterality: N/A;  . ESOPHAGOGASTRODUODENOSCOPY (EGD) WITH PROPOFOL N/A 04/13/2016   Procedure: ESOPHAGOGASTRODUODENOSCOPY (EGD) WITH PROPOFOL;  Surgeon: Otis Brace, MD;  Location: [place];  Service: Gastroenterology;  Laterality: N/A;  . GIVENS CAPSULE STUDY N/A 03/03/2016   Procedure: GIVENS CAPSULE STUDY;  Surgeon: Teena Irani, MD;  [facility];  Service: Endoscopy;  Laterality: N/A;  . HEMORRHOID SURGERY N/A 12/16/2015   Procedure: PROCTOSCOPY WITH CONTROL OF BLEEDING;  Surgeon: Fanny Skates, MD;  Location: [place];  Service: General;  Laterality: N/A;  . HIP PINNING,CANNULATED Right 03/23/2017   Procedure: CANNULATED HIP PINNING;  Surgeon: Marybelle Killings, MD;  Location: [facility];  Service: Orthopedics;  Laterality: Right;  . KNEE SURGERY  1970's   rt  . LAPAROTOMY N/A 09/10/2017   Procedure: EXPLORATORY LAPAROTOMY, PARTIAL COLECTOMY;  Surgeon: Excell Seltzer, MD;  Location: [facility];  Service: General;  Laterality: N/A;  . [place]  . POLYPECTOMY  09/02/2017   Procedure: POLYPECTOMY;  Surgeon: Ronald Lobo, MD;  [facility]: [facility];  Service: Endoscopy;;  . PROSTATE BIOPSY    . SHOULDER SURGERY  7-8 yrs ago   rt    Family History  Problem Relation Age of Onset  . Tuberculosis Maternal Grandmother   . Heart attack Neg Hx   . Diabetes Neg Hx   . CAD Neg Hx   . Cancer Neg Hx     Social History:  reports that  he quit smoking about 43 years ago. His smoking use included cigarettes. He has a 50.00 pack-year smoking history. He has never used smokeless tobacco. He reports that he does not drink alcohol and does not use drugs.  Allergies  Allergen Reactions  .  Penicillins Anaphylaxis    Has patient had a PCN reaction causing immediate rash, facial/tongue/throat swelling, SOB or lightheadedness with hypotension: unknown Has patient had a PCN reaction causing severe rash involving mucus membranes or skin necrosis: unknown Has patient had a PCN reaction that required hospitalization: unknown Has patient had a PCN reaction occurring within the last 10 years: no If all of the above answers are "NO", then may proceed with Cephalosporin use.   Marland Kitchen Antihistamines, Loratadine-Type Other (See Comments)    depression  . Other     Beta blockers-Novacaine  Caused depression    MEDICATIONS:                                                                                                                     Current Facility-Administered Medications  Medication Dose Route Frequency Provider Last Rate Last Admin  . ARIPiprazole (ABILIFY) tablet 2 mg  2 mg Oral Daily Wynetta Fines T, MD      . azithromycin (ZITHROMAX) 500 mg in sodium chloride 0.9 % 250 mL IVPB  500 mg Intravenous Once Truddie Hidden, MD      . buPROPion (WELLBUTRIN XL) 24 hr tablet 300 mg  300 mg Oral Daily Wynetta Fines T, MD      . citalopram (CELEXA) tablet 20 mg  20 mg Oral Daily Wynetta Fines T, MD      . dextrose 5 %-0.9 % sodium chloride infusion   Intravenous Continuous Wynetta Fines T, MD      . enoxaparin (LOVENOX) injection 40 mg  40 mg Subcutaneous Q24H Wynetta Fines T, MD      . gabapentin (NEURONTIN) capsule 200 mg  200 mg Oral BID Wynetta Fines T, MD      . levalbuterol Penne Lash) nebulizer solution 0.63 mg  0.63 mg Nebulization TID Wynetta Fines T, MD   0.63 mg at 08/04/2019 1026   Current Outpatient Medications  Medication Sig Dispense Refill   . acetaminophen (TYLENOL) 325 MG tablet Take 650 mg by mouth every 6 (six) hours as needed for mild pain.    Marland Kitchen allopurinol (ZYLOPRIM) 100 MG tablet Take 100 mg by mouth daily.     Marland Kitchen antiseptic oral rinse (BIOTENE) LIQD 15 mLs by Mouth Rinse route every 4 (four) hours as needed for dry mouth.    . ARIPiprazole (ABILIFY) 2 MG tablet Take 2 mg by mouth daily.    . bisacodyl (DULCOLAX) 10 MG suppository Place 10 mg rectally daily as needed for moderate constipation.    Marland Kitchen buPROPion (WELLBUTRIN XL) 300 MG 24 hr tablet Take 300 mg by mouth daily.    . citalopram (CELEXA) 20 MG tablet Take 1 tablet (20 mg total) by mouth daily. 30 tablet 0  . diltiazem (CARDIZEM CD) 120 MG 24 hr capsule Take 1 capsule (120 mg total) by mouth daily. 60 capsule 0  . docusate sodium (COLACE) 100 MG capsule Take 100 mg by mouth 2 (two) times daily.    . fentaNYL (DURAGESIC) 12 MCG/HR Place  1 patch onto the skin every 3 (three) days.    . ferrous sulfate 325 (65 FE) MG tablet Take 325 mg by mouth daily with breakfast.    . fluticasone (FLONASE) 50 MCG/ACT nasal spray Place 2 sprays into both nostrils daily. 16 g 1  . furosemide (LASIX) 40 MG tablet Take 40 mg by mouth daily.    Marland Kitchen gabapentin (NEURONTIN) 100 MG capsule Take 2 capsules (200 mg total) by mouth 2 (two) times daily. 60 capsule 0  . gabapentin (NEURONTIN) 600 MG tablet Take 600 mg by mouth at bedtime.    Marland Kitchen glimepiride (AMARYL) 2 MG tablet Take 2 mg by mouth daily with breakfast.    . guaifenesin (HUMIBID E) 400 MG TABS tablet Take 400 mg by mouth every 12 (twelve) hours as needed (Congestion).    Marland Kitchen guaiFENesin (MUCINEX) 600 MG 12 hr tablet Take 600 mg by mouth 2 (two) times daily.    . insulin detemir (LEVEMIR) 100 unit/ml SOLN Inject 15 Units into the skin at bedtime.    . levalbuterol (XOPENEX) 0.63 MG/3ML nebulizer solution Take 0.63 mg by nebulization 3 (three) times daily.    Marland Kitchen levofloxacin (LEVAQUIN) 500 MG tablet Take 500 mg by mouth daily. For 10 days.     Marland Kitchen lovastatin (MEVACOR) 20 MG tablet Take 20 mg by mouth at bedtime.     . meclizine (ANTIVERT) 25 MG tablet Take 1 tablet (25 mg total) by mouth 3 (three) times daily as needed for dizziness. (Patient taking differently: Take 25 mg by mouth every 8 (eight) hours as needed for dizziness. ) 30 tablet 0  . metolazone (ZAROXOLYN) 5 MG tablet Take 5 mg by mouth 2 (two) times a week. On Tuesday and Friday    . Multiple Vitamins-Minerals (VISION FORMULA PO) Take 1 tablet by mouth daily.     Marland Kitchen nystatin (MYCOSTATIN) 100000 UNIT/ML suspension Take 5 mLs by mouth 3 (three) times daily.    . pantoprazole (PROTONIX) 40 MG tablet Take 1 tablet (40 mg total) by mouth 2 (two) times daily. 60 tablet 0  . polyethylene glycol (MIRALAX / GLYCOLAX) 17 g packet Take 17 g by mouth daily as needed for mild constipation.    . potassium chloride SA (K-DUR,KLOR-CON) 20 MEQ tablet Take 40 mEq by mouth 2 (two) times daily.   0  . simethicone (MYLICON) 80 MG chewable tablet Chew 80 mg by mouth 3 (three) times daily with meals.    . sodium chloride (OCEAN) 0.65 % SOLN nasal spray Place 1 spray into both nostrils every 2 (two) hours. As needed for dryness.    . temazepam (RESTORIL) 7.5 MG capsule Take 7.5 mg by mouth at bedtime.    Marland Kitchen tiZANidine (ZANAFLEX) 2 MG tablet Take 2 mg by mouth in the morning and at bedtime.    . traMADol (ULTRAM) 50 MG tablet Take 100 mg by mouth 4 (four) times daily.    Marland Kitchen buPROPion (WELLBUTRIN XL) 150 MG 24 hr tablet Take 1 tablet (150 mg total) by mouth daily. (Patient not taking: Reported on 08/04/2019) 30 tablet 1  . furosemide (LASIX) 80 MG tablet Take 1 tablet (80 mg total) by mouth daily. (Patient not taking: Reported on 08/13/2019) 30 tablet 0  . OXYGEN Inhale 2 L into the lungs as needed (shortness of breath).     . temazepam (RESTORIL) 15 MG capsule Take 1 capsule (15 mg total) by mouth at bedtime. (Patient not taking: Reported on 08/04/2019) 30 capsule 0  ROS:                                                                                                                                        History unobtainable d/t AMS   Blood pressure 136/64, pulse (!) 101, temperature (!) 102.7 F (39.3 C), temperature source Rectal, resp. rate (!) 30, weight 95.3 kg, SpO2 100 %.   General Examination:                                                                                                       Physical Exam  Constitutional: Appears frail but non-cachectic Eyes: Normal external eye and conjunctiva. HEENT: Normocephalic, no lesions, without obvious abnormality.   Respiratory: Patient on Bipap  Neurological Examination Mental Status: Obtunded with eyes open. Not following commands. Does not respond to voice. No attempts to communicate. No limb movement or head turning to any external stimuli.  Cranial Nerves: Pendular, smooth, slow, horizontal conjugate to and fro eye movements are present throughout the exam; the eyes move to the far left, then reverse direction smoothly and rove all the way to the right, this process repeats in a regular fashion with one full cycle of eye movement about every 5-6 seconds.  There is no nystagmus.  PERRL. Face is flaccidly symmetric in presence of bipap mask, with no grimace to noxious.  Motor/Sensory: Slightly withdraws each extremity to noxious stimuli on initial exam by NP. On follow up attending exam, no withdrawal or posturing is seen in response to any noxious stimulus. No spontaneous limb, trunk or neck movements are seen.  Reflexes: Hypoactive throughout. Toes are mute bilaterally.  Cerebellar/Gait: Unable to assess.   Lab Results: Basic Metabolic Panel: Recent Labs  Lab 08/23/2019 0715 08/15/2019 0804  NA 141 145  K 3.4* 3.6  CL 100  --   CO2 30  --   GLUCOSE 88  --   BUN 41*  --   CREATININE 1.71*  --   CALCIUM 8.6*  --     CBC: Recent Labs  Lab 08/04/2019 0715 08/04/2019 0804  WBC 10.8*  --   NEUTROABS 9.8*  --   HGB 8.7* 9.2*   HCT 30.0* 27.0*  MCV 99.7  --   PLT 166  --       Imaging: DG Chest 1 View  Result Date: 08/04/2019 CLINICAL DATA:  Shortness of breath starting this morning EXAM: CHEST  1 VIEW COMPARISON:  January 23, 2018 FINDINGS: The mediastinal  contour is normal. The heart size is mildly enlarged. Pulmonary increased interstitium with the enlarged central pulmonary vessel caliber are identified bilaterally. No focal pneumonia or pleural effusion is noted. Bony structures are stable. IMPRESSION: congestive heart failure. Electronically Signed   By: Abelardo Diesel M.D.   On: 08/30/2019 07:24    Assessment: 86 y.o. male with a PMHx of DM, prostate cancer, OSA, HTN, and atrial fibrillation who presented to the [facility] ED from [facility] for unresponsiveness in the setting of severe hypoglycemia, pinpoint pupils that did not respond to fentanyl patch removal and Narcan administration, and hypoxia. 1. Exam reveals an eye-open unresponsive state with bizarre pendular eye movements as described in detail in the exam above. One term for this movement is "roving eye movements", which can be seen with metabolic encephalopathy. However, the regularity as well as the extremes of gaze manifest with this patient's eye movements are more consistent with Periodic Alternating Ping-Pong Gaze, which results from disinhibition of inherent rhythms in the oculovestibular system and are thought to be a type of release phenomenon.  2. Ping-pong gaze localizaton: This can occur with bihemispheric cerebral infarctions/dysfunction, toxic ingestion and metabolic encephalopathy.   3. CT head shows no acute abnormality. Atrophy, chronic small vessel ischemic changes and chronic lacunar infarctions are noted.  4. EEG reveals severe low amplitude diffuse background slowing with periodic/regular horizontal eye movement artifact. No electrographic seizures are seen.  5. MRI brain is pending. 6. Overall presentation is concerning for possible  diffuse hypoglycemic brain injury.   Recommendations: -- MRI brain -- Frequent neuro checks -- May need IV glucose gtt with frequent CBG checks   Laurey Morale, MSN, NP-C [facility] [phone]  I have seen and examined the patient. I have formulated the assessment and recommendations. 86 y.o. male with a PMHx of DM, prostate cancer, OSA, HTN, and atrial fibrillation who presented to the [facility] ED from [facility] for unresponsiveness in the setting of severe hypoglycemia, pinpoint pupils that did not respond to fentanyl patch removal and Narcan administration, and hypoxia. Exam reveals an eye-open unresponsive state with bizarre pendular eye movements as described in detail in the exam above. Overall presentation is concerning for possible diffuse hypoglycemic brain injury. CT head unremarkable. EEG consistent with severe diffuse encephalopathy. Recommendations include MRI brain and frequent neuro checks.  Electronically signed: Dr. Kerney Elbe 08/26/2019, 10:28 AM

## 2019-08-06 ENCOUNTER — Inpatient Hospital Stay (HOSPITAL_COMMUNITY): Payer: Medicare HMO

## 2019-08-06 DIAGNOSIS — I350 Nonrheumatic aortic (valve) stenosis: Secondary | ICD-10-CM

## 2019-08-06 DIAGNOSIS — I5033 Acute on chronic diastolic (congestive) heart failure: Secondary | ICD-10-CM

## 2019-08-06 DIAGNOSIS — I482 Chronic atrial fibrillation, unspecified: Secondary | ICD-10-CM

## 2019-08-06 DIAGNOSIS — R4020 Unspecified coma: Secondary | ICD-10-CM

## 2019-08-06 LAB — BASIC METABOLIC PANEL
Anion gap: 16 — ABNORMAL HIGH (ref 5–15)
Anion gap: 13 (ref 5–15)
BUN: 33 mg/dL — ABNORMAL HIGH (ref 8–23)
BUN: 49 mg/dL — ABNORMAL HIGH (ref 8–23)
CO2: 30 mmol/L (ref 22–32)
CO2: 32 mmol/L (ref 22–32)
Calcium: 8.7 mg/dL — ABNORMAL LOW (ref 8.9–10.3)
Calcium: 8.6 mg/dL — ABNORMAL LOW (ref 8.9–10.3)
Chloride: 100 mmol/L (ref 98–111)
Chloride: 100 mmol/L (ref 98–111)
Creatinine, Ser: 1.6 mg/dL — ABNORMAL HIGH (ref 0.61–1.24)
Creatinine, Ser: 1.38 mg/dL — ABNORMAL HIGH (ref 0.61–1.24)
GFR calc Af Amer: 45 mL/min — ABNORMAL LOW (ref >60)
GFR calc Af Amer: 54 mL/min — ABNORMAL LOW (ref >60)
GFR calc non Af Amer: 39 mL/min — ABNORMAL LOW (ref >60)
GFR calc non Af Amer: 47 mL/min — ABNORMAL LOW (ref >60)
Glucose, Bld: 153 mg/dL — ABNORMAL HIGH (ref 70–99)
Glucose, Bld: 150 mg/dL — ABNORMAL HIGH (ref 70–99)
Potassium: 2.7 mmol/L — CL (ref 3.5–5.1)
Potassium: 3 mmol/L — ABNORMAL LOW (ref 3.5–5.1)
Sodium: 146 mmol/L — ABNORMAL HIGH (ref 135–145)
Sodium: 145 mmol/L (ref 135–145)

## 2019-08-06 LAB — GLUCOSE, CAPILLARY
Glucose-Capillary: 126 mg/dL — ABNORMAL HIGH (ref 70–99)
Glucose-Capillary: 162 mg/dL — ABNORMAL HIGH (ref 70–99)
Glucose-Capillary: 175 mg/dL — ABNORMAL HIGH (ref 70–99)
Glucose-Capillary: 151 mg/dL — ABNORMAL HIGH (ref 70–99)
Glucose-Capillary: 173 mg/dL — ABNORMAL HIGH (ref 70–99)

## 2019-08-06 LAB — CBC
HCT: 31.2 % — ABNORMAL LOW (ref 39.0–52.0)
Hemoglobin: 9 g/dL — ABNORMAL LOW (ref 13.0–17.0)
MCH: 28.4 pg (ref 26.0–34.0)
MCHC: 28.8 g/dL — ABNORMAL LOW (ref 30.0–36.0)
MCV: 98.4 fL (ref 80.0–100.0)
Platelets: 170 10*3/uL (ref 150–400)
RBC: 3.17 MIL/uL — ABNORMAL LOW (ref 4.22–5.81)
RDW: 16.4 % — ABNORMAL HIGH (ref 11.5–15.5)
WBC: 8.1 10*3/uL (ref 4.0–10.5)
nRBC: 0 % (ref 0.0–0.2)

## 2019-08-06 LAB — IRON AND TIBC
Iron: 13 ug/dL — ABNORMAL LOW (ref 45–182)
Saturation Ratios: 4 % — ABNORMAL LOW (ref 17.9–39.5)
TIBC: 309 ug/dL (ref 250–450)
UIBC: 296 ug/dL

## 2019-08-06 LAB — HEMOGLOBIN A1C
Hgb A1c MFr Bld: 5 % (ref 4.8–5.6)
Mean Plasma Glucose: 96.8 mg/dL

## 2019-08-06 LAB — MAGNESIUM: Magnesium: 2.4 mg/dL (ref 1.7–2.4)

## 2019-08-06 LAB — ECHOCARDIOGRAM COMPLETE: Weight: 3361.57 oz

## 2019-08-06 LAB — PHOSPHORUS: Phosphorus: 3.2 mg/dL (ref 2.5–4.6)

## 2019-08-06 LAB — AMMONIA: Ammonia: 35 umol/L (ref 9–35)

## 2019-08-06 MED ORDER — SODIUM CHLORIDE 0.9 % IV SOLN
2.0000 g | Freq: Two times a day (BID) | INTRAVENOUS | Status: DC
  Administered 2019-08-06 – 2019-08-07 (×3): 2 g via INTRAVENOUS
  Filled 2019-08-06 (×6): qty 2

## 2019-08-06 MED ORDER — POTASSIUM CHLORIDE 10 MEQ/100ML IV SOLN
10.0000 meq | INTRAVENOUS | Status: AC
  Administered 2019-08-06 (×4): 10 meq via INTRAVENOUS
  Filled 2019-08-06 (×3): qty 100

## 2019-08-06 MED ORDER — VANCOMYCIN HCL 1250 MG/250ML IV SOLN
1250.0000 mg | INTRAVENOUS | Status: DC
  Administered 2019-08-07: 1250 mg via INTRAVENOUS
  Filled 2019-08-06 (×2): qty 250

## 2019-08-06 MED ORDER — SODIUM CHLORIDE 0.9 % IV SOLN
1.0000 g | Freq: Three times a day (TID) | INTRAVENOUS | Status: DC
  Administered 2019-08-06 (×2): 1 g via INTRAVENOUS
  Filled 2019-08-06 (×4): qty 1

## 2019-08-06 MED ORDER — VANCOMYCIN HCL 2000 MG/400ML IV SOLN
2000.0000 mg | Freq: Once | INTRAVENOUS | Status: AC
  Administered 2019-08-06: 2000 mg via INTRAVENOUS
  Filled 2019-08-06: qty 400

## 2019-08-06 MED ORDER — SODIUM CHLORIDE 0.9 % IV SOLN
2.0000 g | Freq: Two times a day (BID) | INTRAVENOUS | Status: DC
  Administered 2019-08-06: 2 g via INTRAVENOUS
  Filled 2019-08-06: qty 20

## 2019-08-06 MED ORDER — ACETAMINOPHEN 650 MG RE SUPP
650.0000 mg | Freq: Four times a day (QID) | RECTAL | Status: DC | PRN
  Administered 2019-08-06 (×2): 650 mg via RECTAL
  Filled 2019-08-06 (×2): qty 1

## 2019-08-06 MED ORDER — DEXTROSE 5 % IV SOLN
10.0000 mg/kg | Freq: Two times a day (BID) | INTRAVENOUS | Status: DC
  Administered 2019-08-06 – 2019-08-08 (×5): 775 mg via INTRAVENOUS
  Filled 2019-08-06 (×6): qty 15.5

## 2019-08-06 MED ORDER — POTASSIUM CHLORIDE 10 MEQ/100ML IV SOLN
10.0000 meq | INTRAVENOUS | Status: AC
  Administered 2019-08-06 (×5): 10 meq via INTRAVENOUS
  Filled 2019-08-06 (×5): qty 100

## 2019-08-06 MED ORDER — SODIUM CHLORIDE 0.9 % IV SOLN: 400.0000 mg | Freq: Once | INTRAVENOUS | Status: DC

## 2019-08-06 MED ORDER — PANTOPRAZOLE SODIUM 40 MG IV SOLR
40.0000 mg | INTRAVENOUS | Status: DC
  Administered 2019-08-06 – 2019-08-08 (×3): 40 mg via INTRAVENOUS
  Filled 2019-08-06 (×3): qty 40

## 2019-08-06 MED ORDER — ACETAMINOPHEN 10 MG/ML IV SOLN
1000.0000 mg | Freq: Once | INTRAVENOUS | Status: AC
  Administered 2019-08-06: 1000 mg via INTRAVENOUS
  Filled 2019-08-06: qty 100

## 2019-08-06 MED ORDER — POTASSIUM CL IN DEXTROSE 5% 20 MEQ/L IV SOLN
20.0000 meq | INTRAVENOUS | Status: DC
  Administered 2019-08-06 – 2019-08-08 (×4): 20 meq via INTRAVENOUS
  Filled 2019-08-06 (×7): qty 1000

## 2019-08-06 MED ORDER — VANCOMYCIN HCL 750 MG/150ML IV SOLN
750.0000 mg | Freq: Two times a day (BID) | INTRAVENOUS | Status: DC
  Filled 2019-08-06: qty 150

## 2019-08-06 NOTE — Progress Notes (Signed)
Patient back to bed in room from MRI. Tolerated th eprocedure, no acute distress noted.

## 2019-08-06 NOTE — Progress Notes (Signed)
RT transported pt w/transporter from 5W35 to xray and MRI and back without complication. Pt respiratory status remained stable throughout transport. RT will continue to monitor.

## 2019-08-06 NOTE — Progress Notes (Signed)
Report received  From ongoing RN.  Patient is unresponsive, on continuous BIPAP, temperature 102.4, MEWs Red. Provider on call , Dr. Dot Lanes was notified. PRN Tylenol suppository given . Will continue to monitor

## 2019-08-06 NOTE — Progress Notes (Addendum)
Pharmacy Antibiotic Note   (see Addendum for dose changes)   Joel [name] is a 86 y.o. male from SNF admitted on 08/18/2019 with  AMS/unresponsive.  Pharmacy has been consulted for Vancomycin dosing empirically for meningitis.  Plan: Vancomycin 2g IV x1 (given @10 :32 today) then 750 mg IV q12 hours. Continues on Ceftriaxone 2gm IV q12h, Meropenem 1g IV q8h for Listeria (due to PCN rxn and no bactrim for CrCl ~41) and Acyclovir 775mg  IV q12h  Monitor clinical status, renal function and culture results daily.     Height: 6' (182.9 cm) (recorded 07/17/2019) Weight: 95.3 kg (210 lb 1.6 oz) IBW/kg (Calculated) : 77.6  Temp (24hrs), Avg:101.5 F (38.6 C), Min:99.9 F (37.7 C), Max:102.6 F (39.2 C)  Recent Labs  Lab 08/09/2019 0715 08/09/2019 0939 08/06/19 0608  WBC 10.8*  --  8.1  CREATININE 1.71*  --  1.60*  LATICACIDVEN 1.1 1.4  --     Estimated Creatinine Clearance: 41.2 mL/min (A) (by C-G formula based on SCr of 1.6 mg/dL (H)).    Allergies  Allergen Reactions  . Penicillins Anaphylaxis    Has patient had a PCN reaction causing immediate rash, facial/tongue/throat swelling, SOB or lightheadedness with hypotension: unknown Has patient had a PCN reaction causing severe rash involving mucus membranes or skin necrosis: unknown Has patient had a PCN reaction that required hospitalization: unknown Has patient had a PCN reaction occurring within the last 10 years: no If all of the above answers are "NO", then may proceed with Cephalosporin use.   Marland Kitchen Antihistamines, Loratadine-Type Other (See Comments)    depression  . Other     Beta blockers-Novacaine  Caused depression    Antimicrobials this admission: Ceftriaxone 7/4>> Meropenem 7/5>> Acyclovir 7/5>> Vanc 7/4>> Doxy po 7/4  Dose adjustments this admission: n/a  Microbiology results: 7/4 BCx x2 : no growth to date x1 day  Thank you for allowing pharmacy to be a part of this patient's care. Nicole Cella, RPh Clinical  Pharmacist 08/06/2019 2:00 PM    ADDENDUM:     Plan: Due to patient's age and renal function , I will change maintenance Vancomycin dose to 1250 mg IV q24h for empiric meningitis and adjust  Meropenem dose to 2g IV q12h for listeria coverage/meningitis.   Nicole Cella, RPh Clinical Pharmacist 08/06/2019 3:06 PM

## 2019-08-06 NOTE — Progress Notes (Addendum)
PROGRESS NOTE                                                                                                                                                                                                             Patient Demographics:    Joel [name], is a 86 y.o. male, DOB - [birth year], RAQ:[id number]  Admit date - 08/12/2019   Admitting Physician Lequita Halt, MD  Outpatient Primary MD for the patient is Wenda Low, MD  LOS - 1   Chief Complaint  Patient presents with  . Altered Mental Status  . Hypoglycemia       Brief Narrative     [name] is a 86 y.o. male with medical history significant of chronic A. fib, hypertension, bipolar, diastolic CHF chronic, moderate aortic stenosis, chronic O2 dependent respiratory failure, COPD, chronic back and neck pain, diabetic neuropathy, NH resident from [facility] presented for AMS/unresponsive.  Patient is in coma, and unable to reach nursing/staff at nursing home, most of the history provided by patient daughter and ER staff.  Patient baseline is awake alert, chronic O2 dependent respiratory failure on 2 L oxygen.  Patient daughter was contacted 3 days ago about the patient has been having frequent nausea vomited and fever, and pneumonia was suspected and patient was started on Levaquin.  This morning, patient was found unresponsive on the floor, his sugar was 23 and hypoxic.  Patient was given intramuscular glucagon and put on 4 L oxygen. ED Course: Patient received Narcan x1, sugar more stabilized however patient still not waking up.  Glucose 88, BUN 41, creatinine 1.7, potassium 3.4, bicarb 30, WBC 10.8, blood gas 7.45/41/152.  Chest x-ray showed pulmonary edema.    Subjective:    Joel [name] today is nonverbal, no significant events as discussed with staff, he remains febrile since admission, and he has been kept on BiPAP since admission as well.     Assessment  & Plan :    Active  Problems:   DM2 (diabetes mellitus, type 2) (HCC)   Anemia, iron deficiency   COPD (chronic obstructive pulmonary disease) with acute bronchitis (HCC)   CHF exacerbation (HCC)   AMS (altered mental status)   Acute on chronic respiratory failure with hypoxia (HCC)   Acute metabolic/toxic encephalopathy -Patient is extremely obtunded/comatose, trilogy can be multifactorial, trouble hypoglycemic  brain injury is a possibility, as well severe septic encephalopathy is a possibility with possible underlying meningitis could be contributing, as well work-up was significant for small acute CVA (this is likely contributing to his encephalopathy as it does appear to be very small). -For now continue with supportive care, he remains n.p.o., remains on IV fluids. -Neurology assistance greatly appreciated.  SIRS -SIRS criteria met on admission febrile, tachycardic, with evidence of organ failure including by worsening hypoxia requiring BiPAP and encephalopathic. -Source of sepsis could not be clarified yet, can be related to meningitis, continue with broad coverage including meropenem and vancomycin, for now we will hold on LP per discussion with urology till patient is more stable,. -CT abdomen pelvis is pending for further evaluation and source for gram-negative sepsis. -Follow-up blood cultures.  AKI -Hold nephrotoxic medications and continue with IV fluids  Acute hypoxic respiratory failure -Continue with BiPAP and oxygen as needed, patient is BiPAP dependent at bedtime.  Acute CVA -Appears to be small on imaging, unlikely major contributing factor to her symptoms follow passing.  Chronic CHF with preserved EF EF -Even though imaging with evidence of volume overload, but patient clinically appears to be euvolemic, for now continue with current dose IV fluids given he is septic.  Atrial fibrillation -Heart rate controlled, continue with as needed hydralazine, for now holding anticoagulation if  he will need LP  GERD -Continue with IV PPI  Hypoglycemia -Most likely in setting of sepsis, continue with D5W  Normocytic anemia -Hemoglobin appears to be at baseline, continue to monitor  Severe hypokalemia -Repleted, continue to monitor  Pressure ulcers: -Continue with offloading boats Pressure Injury 09/06/17 Stage II -  Partial thickness loss of dermis presenting as a shallow open ulcer with a red, pink wound bed without slough. (Active)  09/06/17 0800  Location: Back  Location Orientation:   Staging: Stage II -  Partial thickness loss of dermis presenting as a shallow open ulcer with a red, pink wound bed without slough.  Wound Description (Comments):   Present on Admission: No (present upon arrival from ICU)     Pressure Injury 09/08/17 Deep Tissue Injury - Purple or maroon localized area of discolored intact skin or blood-filled blister due to damage of underlying soft tissue from pressure and/or shear. (Active)  09/08/17 0931  Location: Heel  Location Orientation: Left;Medial  Staging: Deep Tissue Injury - Purple or maroon localized area of discolored intact skin or blood-filled blister due to damage of underlying soft tissue from pressure and/or shear.  Wound Description (Comments):   Present on Admission: No (present upon arrival from ICU)     Pressure Injury 09/08/17 Stage II -  Partial thickness loss of dermis presenting as a shallow open ulcer with a red, pink wound bed without slough. (Active)  09/08/17 0092  Location: Sacrum  Location Orientation: Medial  Staging: Stage II -  Partial thickness loss of dermis presenting as a shallow open ulcer with a red, pink wound bed without slough.  Wound Description (Comments):   Present on Admission: No (present upon arrival from ICU)     Pressure Injury 01/24/18 Stage II -  Partial thickness loss of dermis presenting as a shallow open ulcer with a red, pink wound bed without slough. (Active)  01/24/18 1032  Location:  Buttocks  Location Orientation: Mid  Staging: Stage II -  Partial thickness loss of dermis presenting as a shallow open ulcer with a red, pink wound bed without slough.  Wound Description (Comments):  Present on Admission: Yes     Pressure Injury 08/29/2019 Heel Deep Tissue Pressure Injury - Purple or maroon localized area of discolored intact skin or blood-filled blister due to damage of underlying soft tissue from pressure and/or shear. (Active)  08/09/2019 1804  Location: Heel  Location Orientation:   Staging: Deep Tissue Pressure Injury - Purple or maroon localized area of discolored intact skin or blood-filled blister due to damage of underlying soft tissue from pressure and/or shear.  Wound Description (Comments):   Present on Admission: Yes        COVID-19 Labs  No results for input(s): DDIMER, FERRITIN, LDH, CRP in the last 72 hours.  Lab Results  Component Value Date   [place] NEGATIVE 08/20/2019     Code Status : DNR  Family Communication  : Discussed with son and daughter at bedside  Disposition Plan  :  Status is: Inpatient  Remains inpatient appropriate because:IV treatments appropriate due to intensity of illness or inability to take PO   Dispo: The patient is from: SNF              Anticipated d/c is to: to be determined              Anticipated d/c date is: > 3 days              Patient currently is not medically stable to d/c.        Barriers For Discharge :   Consults  :  Neurology  Procedures  : None  DVT Prophylaxis  :  [place] lovenox  Lab Results  Component Value Date   PLT 170 08/06/2019    Antibiotics  :    Anti-infectives (From admission, onward)   Start     Dose/Rate Route Frequency Ordered Stop   08/07/19 1000  vancomycin (VANCOREADY) IVPB 1250 mg/250 mL     Discontinue     1,250 mg 166.7 mL/hr over 90 Minutes Intravenous Every 24 hours 08/06/19 1500     08/06/19 2345  meropenem (MERREM) 2 g in sodium chloride 0.9 % 100 mL  IVPB     Discontinue     2 g 200 mL/hr over 30 Minutes Intravenous Every 12 hours 08/06/19 1502     08/06/19 2200  vancomycin (VANCOREADY) IVPB 750 mg/150 mL  Status:  Discontinued        750 mg 150 mL/hr over 60 Minutes Intravenous Every 12 hours 08/06/19 0817 08/06/19 1500   08/06/19 1000  cefTRIAXone (ROCEPHIN) 1 g in sodium chloride 0.9 % 100 mL IVPB  Status:  Discontinued        1 g 200 mL/hr over 30 Minutes Intravenous Every 24 hours 08/30/2019 1104 08/06/19 0739   08/06/19 1000  acyclovir (ZOVIRAX) 775 mg in dextrose 5 % 150 mL IVPB     Discontinue     10 mg/kg  77.6 kg (Ideal) 165.5 mL/hr over 60 Minutes Intravenous Every 12 hours 08/06/19 0754     08/06/19 0900  vancomycin (VANCOREADY) IVPB 2000 mg/400 mL        2,000 mg 200 mL/hr over 120 Minutes Intravenous  Once 08/06/19 0816 08/06/19 1232   08/06/19 0800  meropenem (MERREM) 1 g in sodium chloride 0.9 % 100 mL IVPB  Status:  Discontinued        1 g 200 mL/hr over 30 Minutes Intravenous Every 8 hours 08/06/19 0754 08/06/19 1502   08/06/19 0745  cefTRIAXone (ROCEPHIN) 2 g in sodium chloride 0.9 % 100  mL IVPB  Status:  Discontinued        2 g 200 mL/hr over 30 Minutes Intravenous Every 12 hours 08/06/19 0739 08/06/19 1448   08/29/2019 1115  metroNIDAZOLE (FLAGYL) IVPB 500 mg  Status:  Discontinued        500 mg 100 mL/hr over 60 Minutes Intravenous Every 8 hours 08/03/2019 1104 08/18/2019 1110   08/24/2019 1115  doxycycline (VIBRA-TABS) tablet 100 mg     Discontinue     100 mg Oral Every 12 hours 08/17/2019 1112     08/24/2019 0915  cefTRIAXone (ROCEPHIN) 1 g in sodium chloride 0.9 % 100 mL IVPB        1 g 200 mL/hr over 30 Minutes Intravenous  Once 08/22/2019 0901 08/28/2019 1123   08/09/2019 0915  azithromycin (ZITHROMAX) 500 mg in sodium chloride 0.9 % 250 mL IVPB  Status:  Discontinued        500 mg 250 mL/hr over 60 Minutes Intravenous  Once 08/29/2019 0901 08/30/2019 1449        Objective:   Vitals:   08/06/19 0800 08/06/19 1059  08/06/19 1343 08/06/19 1345  BP:      Pulse:   (!) 104 (!) 106  Resp:   (!) 26 (!) 26  Temp: (!) 101.1 F (38.4 C) (!) 100.8 F (38.2 C)    TempSrc: Axillary Axillary    SpO2:   99% 99%  Weight:      Height:    6' (1.829 m)    Wt Readings from Last 3 Encounters:  08/08/2019 95.3 kg  07/17/19 95.3 kg  03/10/18 108 kg     Intake/Output Summary (Last 24 hours) at 08/06/2019 1507 Last data filed at 08/06/2019 1300 Gross per 24 hour  Intake 881.67 ml  Output 1950 ml  Net -1068.33 ml     Physical Exam  Comatose, unresponsive,noverbal, on BIPAP. Symmetrical Chest wall movement, Good air movement bilaterally, scattered rales, on BIPAP. RRR,No Gallops,Rubs or new Murmurs, No Parasternal Heave +ve B.Sounds, Abd Soft, ,No rebound - guarding or rigidity. Patient with B/L heal ulcer.          Data Review:    CBC Recent Labs  Lab 08/26/2019 0715 08/12/2019 0804 08/06/19 0608  WBC 10.8*  --  8.1  HGB 8.7* 9.2* 9.0*  HCT 30.0* 27.0* 31.2*  PLT 166  --  170  MCV 99.7  --  98.4  MCH 28.9  --  28.4  MCHC 29.0*  --  28.8*  RDW 16.4*  --  16.4*  LYMPHSABS 0.3*  --   --   MONOABS 0.7  --   --   EOSABS 0.0  --   --   BASOSABS 0.0  --   --     Chemistries  Recent Labs  Lab 08/08/2019 0715 08/24/2019 0804 08/06/19 0608  NA 141 145 146*  K 3.4* 3.6 2.7*  CL 100  --  100  CO2 30  --  30  GLUCOSE 88  --  153*  BUN 41*  --  33*  CREATININE 1.71*  --  1.60*  CALCIUM 8.6*  --  8.7*  MG  --   --  2.4  AST 50*  --   --   ALT 19  --   --   ALKPHOS 76  --   --   BILITOT 0.8  --   --    ------------------------------------------------------------------------------------------------------------------ No results for input(s): CHOL, HDL, LDLCALC, TRIG, CHOLHDL, LDLDIRECT in the last 72  hours.  Lab Results  Component Value Date   HGBA1C 5.0 08/06/2019   ------------------------------------------------------------------------------------------------------------------ No  results for input(s): TSH, T4TOTAL, T3FREE, THYROIDAB in the last 72 hours.  Invalid input(s): FREET3 ------------------------------------------------------------------------------------------------------------------ Recent Labs    08/06/19 0608  TIBC 309  IRON 13*    Coagulation profile No results for input(s): INR, PROTIME in the last 168 hours.  No results for input(s): DDIMER in the last 72 hours.  Cardiac Enzymes No results for input(s): CKMB, TROPONINI, MYOGLOBIN in the last 168 hours.  Invalid input(s): CK ------------------------------------------------------------------------------------------------------------------    Component Value Date/Time   BNP 328.3 (H) 08/04/2019 0715    Inpatient Medications  Scheduled Meds: . ARIPiprazole  2 mg Oral Daily  . buPROPion  300 mg Oral Daily  . Chlorhexidine Gluconate Cloth  6 each Topical Daily  . citalopram  20 mg Oral Daily  . doxycycline  100 mg Oral Q12H  . enoxaparin (LOVENOX) injection  40 mg Subcutaneous Q24H  . gabapentin  200 mg Oral BID  . insulin aspart  0-9 Units Subcutaneous TID WC  . levalbuterol  0.63 mg Nebulization TID  . pantoprazole (PROTONIX) IV  40 mg Intravenous Q24H   Continuous Infusions: . acyclovir 775 mg (08/06/19 1056)  . dextrose 5 % with KCl 20 mEq / L 20 mEq (08/06/19 0902)  . meropenem (MERREM) IV    . [START ON 08/07/2019] vancomycin     PRN Meds:.acetaminophen, artificial tears, glucagon (human recombinant), metoprolol tartrate, ondansetron (ZOFRAN) IV  Micro Results Recent Results (from the past 240 hour(s))  SARS Coronavirus 2 by RT PCR (hospital order, performed in [facility] hospital lab) Nasopharyngeal Nasopharyngeal Swab     Status: None   Collection Time: 08/02/2019  7:15 AM   Specimen: Nasopharyngeal Swab  Result Value Ref Range Status   SARS Coronavirus 2 NEGATIVE NEGATIVE Final    Comment: (NOTE) SARS-CoV-2 target nucleic acids are NOT DETECTED.  The SARS-CoV-2 RNA is  generally detectable in upper and lower respiratory specimens during the acute phase of infection. The lowest concentration of SARS-CoV-2 viral copies this assay can detect is 250 copies / mL. A negative result does not preclude SARS-CoV-2 infection and should not be used as the sole basis for treatment or other patient management decisions.  A negative result may occur with improper specimen collection / handling, submission of specimen other than nasopharyngeal swab, presence of viral mutation(s) within the areas targeted by this assay, and inadequate number of viral copies (<250 copies / mL). A negative result must be combined with clinical observations, patient history, and epidemiological information.  Fact Sheet for Patients:   StrictlyIdeas.no  Fact Sheet for Healthcare Providers: BankingDealers.co.za  This test is not yet approved or  cleared by the [place] FDA and has been authorized for detection and/or diagnosis of SARS-CoV-2 by FDA under an Emergency Use Authorization (EUA).  This EUA will remain in effect (meaning this test can be used) for the duration of the COVID-19 declaration under Section 564(b)(1) of the Act, 21 U.S.C. section 360bbb-3(b)(1), unless the authorization is terminated or revoked sooner.  Performed at [facility], [place] [address] [place], [place] [address]   Culture, blood (routine x 2)     Status: None (Preliminary result)   Collection Time: 08/13/2019  7:20 AM   Specimen: BLOOD RIGHT ARM  Result Value Ref Range Status   Specimen Description BLOOD RIGHT ARM  Final   Special Requests   Final    BOTTLES DRAWN  AEROBIC AND ANAEROBIC Blood Culture adequate volume   Culture   Final    NO GROWTH 1 DAY Performed at [facility], [place] [name], [place], [place] [address]    Report Status PENDING  Incomplete  Culture, blood (routine x 2)     Status: None (Preliminary result)   Collection  Time: 08/22/2019  7:30 AM   Specimen: BLOOD RIGHT HAND  Result Value Ref Range Status   Specimen Description BLOOD RIGHT HAND  Final   Special Requests   Final    BOTTLES DRAWN AEROBIC AND ANAEROBIC Blood Culture adequate volume   Culture   Final    NO GROWTH 1 DAY Performed at [facility], [place] [address], [place], [place] [address]    Report Status PENDING  Incomplete    Radiology Reports DG Chest 1 View  Result Date: 08/06/2019 CLINICAL DATA:  Fever. EXAM: CHEST  1 VIEW COMPARISON:  Single-view of the chest 08/07/2019 01/23/2018. FINDINGS: Aeration is improved since the most recent examination. Mild, hazy opacity over the lower right chest is suggestive of a small effusion. Heart size is enlarged. Aortic atherosclerosis noted. No pneumothorax. IMPRESSION: Improved aeration bilaterally. Likely small right pleural effusion. Electronically Signed   By: Inge Rise M.D.   On: 08/06/2019 12:26   DG Chest 1 View  Result Date: 08/31/2019 CLINICAL DATA:  Shortness of breath starting this morning EXAM: CHEST  1 VIEW COMPARISON:  January 23, 2018 FINDINGS: The mediastinal contour is normal. The heart size is mildly enlarged. Pulmonary increased interstitium with the enlarged central pulmonary vessel caliber are identified bilaterally. No focal pneumonia or pleural effusion is noted. Bony structures are stable. IMPRESSION: congestive heart failure. Electronically Signed   By: Abelardo Diesel M.D.   On: 08/17/2019 07:24   DG Shoulder Right  Result Date: 07/21/2019 CLINICAL DATA:  Pain EXAM: RIGHT SHOULDER - 2+ VIEW COMPARISON:  None. FINDINGS: There is no evidence of fracture or dislocation. Advanced right shoulder osteoarthritis is seen with joint space loss and marginal osteophyte formation. There is diffuse osteopenia. IMPRESSION: No acute osseous abnormality. Electronically Signed   By: Prudencio Pair M.D.   On: 07/21/2019 01:45   DG Abd 1 View  Result Date: 08/06/2019 CLINICAL DATA:   Evaluate for possible metallic foreign body EXAM: ABDOMEN - 1 VIEW COMPARISON:  None. FINDINGS: Scattered large and small bowel gas is noted. No abnormal mass or abnormal calcifications are noted. No free air is seen. Fixation screws are noted in the proximal right femur. Fiducial markers are noted in the prostate. Degenerative changes of the lumbar spine are noted. No other focal abnormality is noted. IMPRESSION: No radiopaque foreign body is identified to preclude MR imaging. Postsurgical changes are seen. Electronically Signed   By: Inez Catalina M.D.   On: 08/06/2019 01:48   CT Head Wo Contrast  Result Date: 08/07/2019 CLINICAL DATA:  Altered mental status, unclear cause. Additional history provided: Unresponsive, hypoglycemic. EXAM: CT HEAD WITHOUT CONTRAST TECHNIQUE: Contiguous axial images were obtained from the base of the skull through the vertex without intravenous contrast. COMPARISON:  Prior head CT 09/08/2017, brain MRI 01/27/2016 FINDINGS: Brain: Stable, mild generalized parenchymal atrophy. Stable, mild ill-defined hypoattenuation within the cerebral white matter which is nonspecific, but consistent with chronic small vessel ischemic disease. Redemonstrated chronic lacunar infarcts within the right basal ganglia and left thalamus. There is no acute intracranial hemorrhage. No acute demarcated cortical infarct is identified. No extra-axial fluid collection. No evidence of intracranial mass.  No midline shift. Vascular: No hyperdense vessel.  Atherosclerotic calcifications. Skull: Normal. Negative for fracture or focal lesion. Sinuses/Orbits: Visualized orbits show no acute finding. Mild paranasal sinus mucosal thickening at the imaged levels, most notably ethmoid. No significant mastoid effusion. IMPRESSION: No CT evidence of acute intracranial abnormality. Stable, mild generalized parenchymal atrophy and chronic small vessel ischemic disease. Redemonstrated chronic lacunar infarcts within the right  basal ganglia and left thalamus. Mild paranasal sinus mucosal thickening. Electronically Signed   By: Kellie Simmering DO   On: 08/18/2019 14:49   MR BRAIN WO CONTRAST  Result Date: 08/06/2019 CLINICAL DATA:  Initial evaluation for acute encephalopathy. EXAM: MRI HEAD WITHOUT CONTRAST TECHNIQUE: Multiplanar, multiecho pulse sequences of the brain and surrounding structures were obtained without intravenous contrast. COMPARISON:  Prior head CT from 08/19/2019. FINDINGS: Brain: Generalized age-related cerebral atrophy. Mild scattered patchy T2/FLAIR hyperintensity within the periventricular deep white matter both cerebral hemispheres most consistent with chronic small vessel ischemic disease, mild for age. Remote lacunar infarct noted at the right frontal corona radiata. There is a punctate focus of restricted diffusion involving the cortical gray matter of the anterior left frontal lobe (series 5, image 88). Additional punctate focus of diffusion abnormality seen involving the cortical gray matter of the posterior right parietal lobe (series 5, image 82). Finding suspicious for tiny acute ischemic infarcts. No associated hemorrhage or mass effect. Few additional somewhat vague foci of diffusion abnormality involving the hemispheric cerebral white matter favored to reflect T2 shine through. No other evidence for acute ischemia. Gray-white matter differentiation maintained. No encephalomalacia to suggest chronic cortical infarction. No evidence for acute intracranial hemorrhage. Few punctate foci of susceptibility artifact noted, suggesting small chronic microhemorrhages, likely small vessel related. No mass lesion, midline shift or mass effect. No hydrocephalus or extra-axial fluid collection. Pituitary gland suprasellar region normal. Midline structures intact. Vascular: Major intracranial vascular flow voids are maintained. Skull and upper cervical spine: Craniocervical junction within normal limits. Bone marrow  signal intensity normal. No scalp soft tissue abnormality. Sinuses/Orbits: Patient status post bilateral ocular lens replacement. Mild scattered mucosal thickening noted within the sphenoethmoidal sinuses. Mastoid air cells are clear. Middle ear cavities grossly within normal limits. Other: None. IMPRESSION: 1. Two punctate foci of restricted diffusion involving the cortical gray matter of the anterior left frontal and posterior right parietal lobes as above, suspicious for tiny acute ischemic infarcts. No associated hemorrhage or mass effect. 2. No other acute intracranial abnormality. 3. Age-related cerebral atrophy with underlying mild chronic small vessel ischemic disease. Electronically Signed   By: Jeannine Boga M.D.   On: 08/06/2019 03:43   DG Knee Complete 4 Views Right  Result Date: 07/21/2019 CLINICAL DATA:  Pain EXAM: RIGHT KNEE - COMPLETE 4+ VIEW COMPARISON:  None. FINDINGS: No fracture or dislocation. There is tricompartmental osteoarthritis, most notable in the medial compartment and patellofemoral compartment. No large knee joint effusion. There is soft tissue swelling over the medial aspect of the knee. Scattered vascular calcifications are noted. IMPRESSION: No acute osseous abnormality. Electronically Signed   By: Prudencio Pair M.D.   On: 07/21/2019 01:51   EEG adult  Result Date: 08/21/2019 Philemon Kingdom, MD     08/08/2019 12:17 PM Date of Study: 08/03/2019 Reason for Study:  Pt is a 70 YOM with PMH of DM II, PROSTATE CA, OSA, HTN, A.FIB who presented with AMS. Eval for seizures. Description of recording: This recording was obtained using a Digital EEG system with 18 channel capacity . Standard bipolar and  referential EEG montages were used following the International 10 - 20 System . This is a digitally recorded EEG with duration of approximately ~ 23:28 minutes. There is diffuse background slowing consisting of delta rhythm without any well defined posterior dominant alpha rhythm.  There were no epileptiform discharges or electrographic seizures noted. Hyperventilation was not performed. Photic stimulation not was performed. unable to interpret EKG. IMPRESSION: This is an abnormal EEG for age due to diffuse background slowing which can be seen with toxic/metabolic encephalopathy, cerebral ischemia, intracerebral hemorrhage, tumors, traumatic brain injury, cortical malformations, or other cerebral dysfunction. There were no clinical seizures or epileptiform discharges noted during study. Clinical correlation is advised. I have personally reviewed this study.   DG Hip Unilat W or Wo Pelvis 2-3 Views Left  Result Date: 07/21/2019 CLINICAL DATA:  Pain. Fall off toilet while using the bathroom. EXAM: DG HIP (WITH OR WITHOUT PELVIS) 2-3V LEFT COMPARISON:  None. FINDINGS: Right hip better assessed on concurrent right hip radiograph, performed concurrently. No evidence of pelvis or left hip fracture. Left femoral head is well seated in the acetabulum. Pubic rami are intact. Pubic symphysis and sacroiliac joints are congruent. Surgical clips in the region of the prostate. IMPRESSION: No fracture of the pelvis or left hip. Electronically Signed   By: Keith Rake M.D.   On: 07/21/2019 01:53   DG Hip Unilat W or Wo Pelvis 2-3 Views Right  Result Date: 07/21/2019 CLINICAL DATA:  Pain. Fall off toilet while using the bathroom. Right hip and knee pain. EXAM: DG HIP (WITH OR WITHOUT PELVIS) 2-3V RIGHT COMPARISON:  Pelvis and hip radiograph 01/23/2018 FINDINGS: Mild a screw fixation of remote proximal femur fracture. The screws extend into the soft tissues approximately 3.6 cm laterally, which appears slightly increased from prior exam. Remote femoral neck fracture in unchanged alignment. Femoral head remains seated. No evidence of acute hip fracture. The pubic rami are intact. IMPRESSION: 1. No acute fracture of the pelvis or right hip. 2. Screw fixation of remote proximal femur fracture. The  screws extend into the lateral soft tissues, which appears slightly increased from prior exam. Prior femoral neck fracture is unchanged in alignment. Electronically Signed   By: Keith Rake M.D.   On: 07/21/2019 01:52   VAS [place] LOWER EXTREMITY VENOUS REFLUX  Result Date: 07/17/2019  Lower Venous Reflux Study Indications: Swelling, Edema, and Pain.  Performing Technologist: Delorise Shiner RVT  Examination Guidelines: A complete evaluation includes B-mode imaging, spectral Doppler, color Doppler, and power Doppler as needed of all accessible portions of each vessel. Bilateral testing is considered an integral part of a complete examination. Limited examinations for reoccurring indications may be performed as noted. The reflux portion of the exam is performed with the patient in reverse Trendelenburg. Significant venous reflux is defined as >500 ms in the superficial venous system, and >1 second in the deep venous system.  Venous Reflux Times +---------------+---------+------+-----------+------------+--------+ RIGHT          Reflux NoRefluxReflux TimeDiameter cmsComments                          Yes                                  +---------------+---------+------+-----------+------------+--------+ CFV            no                                             +---------------+---------+------+-----------+------------+--------+  FV prox        no                                             +---------------+---------+------+-----------+------------+--------+ FV mid         no                                             +---------------+---------+------+-----------+------------+--------+ FV dist        no                                             +---------------+---------+------+-----------+------------+--------+ Popliteal      no                                             +---------------+---------+------+-----------+------------+--------+ GSV at [facility]     no                            0.751             +---------------+---------+------+-----------+------------+--------+ GSV prox thigh no                           0.472             +---------------+---------+------+-----------+------------+--------+ GSV mid thigh  no                           0.575             +---------------+---------+------+-----------+------------+--------+ GSV dist thigh no                           0.411             +---------------+---------+------+-----------+------------+--------+ GSV at knee    no                           0.365             +---------------+---------+------+-----------+------------+--------+ GSV prox calf                               0.269             +---------------+---------+------+-----------+------------+--------+ GSV mid calf                                0.217             +---------------+---------+------+-----------+------------+--------+ SSV Pop Fossa                               0.138             +---------------+---------+------+-----------+------------+--------+ SSV prox calf  no  0.143             +---------------+---------+------+-----------+------------+--------+ SSV mid calf   no                           0.143             +---------------+---------+------+-----------+------------+--------+ AAGSV prx thighno                           0.315             +---------------+---------+------+-----------+------------+--------+ AAGSV mid thighno                           0.297             +---------------+---------+------+-----------+------------+--------+  +--------------+---------+------+-----------+------------+--------+ LEFT          Reflux NoRefluxReflux TimeDiameter cmsComments                         Yes                                  +--------------+---------+------+-----------+------------+--------+ CFV           no                                              +--------------+---------+------+-----------+------------+--------+ FV prox       no                                             +--------------+---------+------+-----------+------------+--------+ FV mid        no                                             +--------------+---------+------+-----------+------------+--------+ FV dist       no                                             +--------------+---------+------+-----------+------------+--------+ Popliteal     no                                             +--------------+---------+------+-----------+------------+--------+ GSV at SFJ    no                           0.713             +--------------+---------+------+-----------+------------+--------+ GSV prox thighno                           0.842             +--------------+---------+------+-----------+------------+--------+ GSV mid thigh no  0.561             +--------------+---------+------+-----------+------------+--------+ GSV dist thighno                           0.429             +--------------+---------+------+-----------+------------+--------+ GSV at knee   no                           0.416             +--------------+---------+------+-----------+------------+--------+ GSV prox calf                              0.292             +--------------+---------+------+-----------+------------+--------+ GSV mid calf                               0.415             +--------------+---------+------+-----------+------------+--------+ SSV Pop Fossa                              0.283             +--------------+---------+------+-----------+------------+--------+ SSV prox calf no                           0.216             +--------------+---------+------+-----------+------------+--------+ SSV mid calf  no                           0.135              +--------------+---------+------+-----------+------------+--------+   Summary: Bilateral: - No evidence of deep vein thrombosis seen in the lower extremities, bilaterally, from the common femoral through the popliteal veins. - No evidence of deep venous insufficiency seen bilaterally in the lower extremity. - No evidence of superficial venous reflux seen in the greater saphenous veins bilaterally. - No evidence of superficial venous reflux seen in the short saphenous veins bilaterally.  Right: - Venous Doppler signals are pulsatile bilaterally.  Left: - Color duplex evaluation of the left lower extremity shows there is thrombus in the lesser saphenous vein. - The distal left small saphenous vein is non compressible consistent with chronic thrombus. Venous Doppler signals are pulsatile bilaterally.  *See table(s) above for measurements and observations. Electronically signed by Monica Martinez MD on 07/17/2019 at 5:04:08 PM.    Final      Phillips Climes M.D on 08/06/2019 at 3:07 PM    [facility] -  Office  [phone]

## 2019-08-06 NOTE — Progress Notes (Signed)
   08/23/2019 1737  Assess: MEWS Score  ECG Heart Rate 97  Assess: MEWS Score  MEWS Temp 2  MEWS Systolic 0  MEWS Pulse 0  MEWS RR 3  MEWS LOC 0  MEWS Score 5  MEWS Score Color Red  Assess: if the MEWS score is Yellow or Red  Were vital signs taken at a resting state? Yes  Focused Assessment Documented focused assessment  Early Detection of Sepsis Score *See Row Information* High  MEWS guidelines implemented *See Row Information* Yes  Treat  MEWS Interventions Administered scheduled meds/treatments;Administered prn meds/treatments  Take Vital Signs  Increase Vital Sign Frequency  Red: Q 1hr X 4 then Q 4hr X 4, if remains red, continue Q 4hrs  Escalate  MEWS: Escalate Red: discuss with charge nurse/RN and provider, consider discussing with RRT  Notify: Charge Nurse/RN  Name of Charge Nurse/RN Notified Cassidy, RN  Date Charge Nurse/RN Notified 08/15/2019  Time Charge Nurse/RN Notified 1730  Document  Patient Outcome Not stable and remains on department

## 2019-08-06 NOTE — Progress Notes (Signed)
Subjective: No significant changes. HE was febrile for several days prior to admission. Source unclear. Did not complain of headaches to daughter, but she is not with him all the time. Had some n/v on the days prior to admission.  Exam: Vitals:   08/06/19 0800 08/06/19 1059  BP:    Pulse:    Resp:    Temp: (!) 101.1 F (38.4 C) (!) 100.8 F (38.2 C)  SpO2:     Gen: In bed, on bipap Resp: on bipap Abd: soft, nt  Neuro: MS: eyes open partially to noxious stimulation CN: PERRL, does not blink to threat,  Motor: tripl eflexion bilateral lower extremities, flexion vs withdrawal in upper extremities.  Sensory:as above.  Toes are upgoing bilaterally.   Pertinent Labs: Cr 1.7, BUN 41 WBC 10.8 Cultures NGTD  Impression: 86 yo M with acute encephalopathy following 2-3 day prodrome of fever, nausea, vomiting. With the low glucose on initial exam, hypoglycemic brain injury is a possibility, and the subtle cortical diffusion changes on MRI could be evidence of that rather than ischemic strokes. With afib, though also possible is breakthrough distal emboli. If he survives then could consider further workup, but I do not think the imaging findings are playing a role in his current state.   Severe septic encephalopathy is also possible, and would favor looking for gram negative source with further abdominal imaging as gram negative infections can cause significant encephalopathy.   CNS infection is certainly possible. I am not sure if he would tolerate an LP currently. If no source continues to be found, could consider LP, but right now he is already empirically covered and CSF is likely already sterilized.   Prolonged hypoxia can also give this picture, though usually to achieve this degree of dysfunction, patients have arrest or some other acute event, no just prolonged moderate hypoxia.   Recommendations: 1) May consider LP once more stable 2) check ammonia 3) Will follow.   Roland Rack, MD [facility] [phone]  If 7pm- 7am, please page neurology on call as listed in [place].

## 2019-08-06 NOTE — Progress Notes (Signed)
Patient out of unit for Xray and MRI  Patient is on bipap, assisted by transporter and RT.

## 2019-08-06 NOTE — Progress Notes (Signed)
Echocardiogram 2D Echocardiogram has been performed.  Oneal Deputy Freada Twersky 08/06/2019, 10:31 AM

## 2019-08-06 NOTE — Plan of Care (Signed)
Pt continues to be non-responsive, patient on BIPAP, Temperature controlled by cooling ice pack, Temperature is 99.7 at this time.  MRI was done in this shift, Pt tolerated well Will continue to monitor.

## 2019-08-07 LAB — BASIC METABOLIC PANEL
Anion gap: 11 (ref 5–15)
BUN: 26 mg/dL — ABNORMAL HIGH (ref 8–23)
CO2: 31 mmol/L (ref 22–32)
Calcium: 8.7 mg/dL — ABNORMAL LOW (ref 8.9–10.3)
Chloride: 99 mmol/L (ref 98–111)
Creatinine, Ser: 1.33 mg/dL — ABNORMAL HIGH (ref 0.61–1.24)
GFR calc Af Amer: 56 mL/min — ABNORMAL LOW (ref >60)
GFR calc non Af Amer: 49 mL/min — ABNORMAL LOW (ref >60)
Glucose, Bld: 221 mg/dL — ABNORMAL HIGH (ref 70–99)
Potassium: 3.2 mmol/L — ABNORMAL LOW (ref 3.5–5.1)
Sodium: 141 mmol/L (ref 135–145)

## 2019-08-07 LAB — CBC
HCT: 30.3 % — ABNORMAL LOW (ref 39.0–52.0)
Hemoglobin: 8.7 g/dL — ABNORMAL LOW (ref 13.0–17.0)
MCH: 28 pg (ref 26.0–34.0)
MCHC: 28.7 g/dL — ABNORMAL LOW (ref 30.0–36.0)
MCV: 97.4 fL (ref 80.0–100.0)
Platelets: 153 10*3/uL (ref 150–400)
RBC: 3.11 MIL/uL — ABNORMAL LOW (ref 4.22–5.81)
RDW: 15.9 % — ABNORMAL HIGH (ref 11.5–15.5)
WBC: 8.1 10*3/uL (ref 4.0–10.5)
nRBC: 0 % (ref 0.0–0.2)

## 2019-08-07 LAB — MAGNESIUM: Magnesium: 2.5 mg/dL — ABNORMAL HIGH (ref 1.7–2.4)

## 2019-08-07 LAB — PHOSPHORUS: Phosphorus: 1.9 mg/dL — ABNORMAL LOW (ref 2.5–4.6)

## 2019-08-07 LAB — PROCALCITONIN: Procalcitonin: 1.36 ng/mL

## 2019-08-07 LAB — GLUCOSE, CAPILLARY
Glucose-Capillary: 190 mg/dL — ABNORMAL HIGH (ref 70–99)
Glucose-Capillary: 175 mg/dL — ABNORMAL HIGH (ref 70–99)
Glucose-Capillary: 175 mg/dL — ABNORMAL HIGH (ref 70–99)
Glucose-Capillary: 169 mg/dL — ABNORMAL HIGH (ref 70–99)

## 2019-08-07 MED ORDER — POTASSIUM CHLORIDE 10 MEQ/100ML IV SOLN
10.0000 meq | INTRAVENOUS | Status: AC
  Administered 2019-08-07 (×3): 10 meq via INTRAVENOUS
  Filled 2019-08-07 (×3): qty 100

## 2019-08-07 MED ORDER — LEVALBUTEROL HCL 0.63 MG/3ML IN NEBU: 0.6300 mg | INHALATION_SOLUTION | Freq: Four times a day (QID) | RESPIRATORY_TRACT | Status: DC | PRN

## 2019-08-07 MED ORDER — MORPHINE SULFATE (PF) 2 MG/ML IV SOLN
1.0000 mg | INTRAVENOUS | Status: DC | PRN
  Administered 2019-08-07: 2 mg via INTRAVENOUS
  Filled 2019-08-07: qty 1

## 2019-08-07 MED ORDER — MORPHINE SULFATE (PF) 2 MG/ML IV SOLN
1.0000 mg | INTRAVENOUS | Status: DC | PRN
  Administered 2019-08-08 (×2): 1 mg via INTRAVENOUS
  Filled 2019-08-07 (×3): qty 1

## 2019-08-07 MED ORDER — ACETAMINOPHEN 650 MG RE SUPP
650.0000 mg | RECTAL | Status: DC | PRN
  Administered 2019-08-07 (×4): 650 mg via RECTAL
  Filled 2019-08-07 (×4): qty 1

## 2019-08-07 MED ORDER — LORAZEPAM 2 MG/ML IJ SOLN
0.5000 mg | Freq: Once | INTRAMUSCULAR | Status: AC
  Administered 2019-08-07: 0.5 mg via INTRAVENOUS
  Filled 2019-08-07: qty 1

## 2019-08-07 NOTE — Progress Notes (Addendum)
PROGRESS NOTE                                                                                                                                                                                                             Patient Demographics:    Joel [name], is a 86 y.o. male, DOB - 1932-11-29, ZRA:[id number]  Admit date - 08/09/2019   Admitting Physician Lequita Halt, MD  Outpatient Primary MD for the patient is Wenda Low, MD  LOS - 2   Chief Complaint  Patient presents with  . Altered Mental Status  . Hypoglycemia       Brief Narrative     Joel [name] is a 86 y.o. male with medical history significant of chronic A. fib, hypertension, bipolar, diastolic CHF chronic, moderate aortic stenosis, chronic O2 dependent respiratory failure, COPD, chronic back and neck pain, diabetic neuropathy, NH resident from [facility] presented for AMS/unresponsive.  Patient is in coma, and unable to reach nursing/staff at nursing home, most of the history provided by patient daughter and ER staff.  Patient baseline is awake alert, chronic O2 dependent respiratory failure on 2 L oxygen.  Patient daughter was contacted 3 days ago about the patient has been having frequent nausea vomited and fever, and pneumonia was suspected and patient was started on Levaquin.  This morning, patient was found unresponsive on the floor, his sugar was 23 and hypoxic.  Patient was given intramuscular glucagon and put on 4 L oxygen. ED Course: Patient received Narcan x1, sugar more stabilized however patient still not waking up.  Glucose 88, BUN 41, creatinine 1.7, potassium 3.4, bicarb 30, WBC 10.8, blood gas 7.45/41/152.  Chest x-ray showed pulmonary edema.  -Patient is admitted with a coma, etiology most likely related to hypoglycemic brain injury, as well as infectious etiology is possible given significant fever, as well with respiratory failure requiring continuous BiPAP  support    Subjective:    Joel [name] today is nonverbal, fever has improved overnight, but he still remains febrile, could not tolerate BiPAP weaning this morning .  Assessment  & Plan :    Active Problems:   DM2 (diabetes mellitus, type 2) (HCC)   Anemia, iron deficiency   COPD (chronic obstructive pulmonary disease) with acute bronchitis (HCC)   CHF exacerbation (HCC)   AMS (altered mental status)  Acute on chronic respiratory failure with hypoxia (HCC)   Acute metabolic/toxic encephalopathy: -Patient is extremely obtunded/comatose, etiology can be multifactorial, likely related hypoglycemic brain injury is a , as well severe septic encephalopathy is a possibility with possible underlying meningitis could be contributing, as well work-up was significant for small acute CVA (this is likely contributing to his encephalopathy as it does appear to be very small). -For now continue with supportive care, he remains n.p.o., remains on IV fluids. -Neurology assistance greatly appreciated.  SIRS -SIRS criteria met on admission febrile, tachycardic, with evidence of organ failure including by worsening hypoxia requiring BiPAP and encephalopathic. -Source of sepsis could not be clarified yet, can be related to meningitis, continue with broad coverage including meropenem and vancomycin, for now we will hold on LP per discussion with urology till patient is more stable,. -CT abdomen pelvis is pending for further evaluation.  -so far blood culture remains negative.  AKI -Hold nephrotoxic medications and continue with IV fluids -Improving with IV fluids  Acute hypoxic respiratory failure -At baseline patient requiring BiPAP at bedtime. -Patient has been requiring continuous BiPAP support since admission, have tried to take him off BiPAP earlier this morning, he became significantly tachycardic in the 120s, and tachypneic in the high 30s, so he was put back on BiPAP.  Acute CVA -Appears to  be small on imaging, unlikely major contributing factor to his encephalopathy.  Chronic CHF with preserved EF EF -Even though imaging with evidence of volume overload, but patient clinically appears to be euvolemic, for now continue with current dose IV fluids given he is septic.  Atrial fibrillation -Heart rate controlled, continue with as needed hydralazine, for now holding anticoagulation if he will need LP  GERD -Continue with IV PPI  Hypoglycemia -[place] improving with D5W  Normocytic anemia -Hemoglobin appears to be at baseline, continue to monitor  Severe hypokalemia -Repleted, continue to monitor  Pressure ulcers: -Continue with offloading boats Pressure Injury 09/06/17 Stage II -  Partial thickness loss of dermis presenting as a shallow open ulcer with a red, pink wound bed without slough. (Active)  09/06/17 0800  Location: Back  Location Orientation:   Staging: Stage II -  Partial thickness loss of dermis presenting as a shallow open ulcer with a red, pink wound bed without slough.  Wound Description (Comments):   Present on Admission: No (present upon arrival from ICU)     Pressure Injury 09/08/17 Deep Tissue Injury - Purple or maroon localized area of discolored intact skin or blood-filled blister due to damage of underlying soft tissue from pressure and/or shear. (Active)  09/08/17 0931  Location: Heel  Location Orientation: Left;Medial  Staging: Deep Tissue Injury - Purple or maroon localized area of discolored intact skin or blood-filled blister due to damage of underlying soft tissue from pressure and/or shear.  Wound Description (Comments):   Present on Admission: No (present upon arrival from ICU)     Pressure Injury 09/08/17 Stage II -  Partial thickness loss of dermis presenting as a shallow open ulcer with a red, pink wound bed without slough. (Active)  09/08/17 1165  Location: Sacrum  Location Orientation: Medial  Staging: Stage II -  Partial thickness  loss of dermis presenting as a shallow open ulcer with a red, pink wound bed without slough.  Wound Description (Comments):   Present on Admission: No (present upon arrival from ICU)     Pressure Injury 01/24/18 Stage II -  Partial thickness loss of dermis presenting as a  shallow open ulcer with a red, pink wound bed without slough. (Active)  01/24/18 1032  Location: Buttocks  Location Orientation: Mid  Staging: Stage II -  Partial thickness loss of dermis presenting as a shallow open ulcer with a red, pink wound bed without slough.  Wound Description (Comments):   Present on Admission: Yes     Pressure Injury 08/31/2019 Heel Deep Tissue Pressure Injury - Purple or maroon localized area of discolored intact skin or blood-filled blister due to damage of underlying soft tissue from pressure and/or shear. (Active)  08/04/2019 1804  Location: Heel  Location Orientation:   Staging: Deep Tissue Pressure Injury - Purple or maroon localized area of discolored intact skin or blood-filled blister due to damage of underlying soft tissue from pressure and/or shear.  Wound Description (Comments):   Present on Admission: Yes     Pressure Injury 08/07/19 Nose Upper;Medial Stage 2 -  Partial thickness loss of dermis presenting as a shallow open injury with a red, pink wound bed without slough. device related pressure injury r/t bipap mask (Active)  08/07/19 1133  Location: Nose  Location Orientation: Upper;Medial  Staging: Stage 2 -  Partial thickness loss of dermis presenting as a shallow open injury with a red, pink wound bed without slough.  Wound Description (Comments): device related pressure injury r/t bipap mask  Present on Admission:         COVID-19 Labs  No results for input(s): DDIMER, FERRITIN, LDH, CRP in the last 72 hours.  Lab Results  Component Value Date   [place] NEGATIVE 08/09/2019    Addendum: -I have discussed at length with daughter at bedside, overall very poor  prognosis, especially in this patient is encephalopathic, likely due to hypoglycemic brain injury, with no signs of recovery, he remains on BiPAP, NOT tolerating to be weaned off BiPAP, already on his second day on BiPAP, overall prognosis is extremely poor, very poor chance of recovery and surviving this , and we have started to talk about palliative care, see what they have to offer, regarding goals of care, and symptoms management, and she is very receptive, she tells me her mom died with COPD disease, where she  required morphine drip with palliative/hospice, and they are looking forward for discussion with palliative medicine about goals of care discussion.  code Status : DNR  Family Communication  : Discussed with son and daughter at bedside  Disposition Plan  :  Status is: Inpatient  Remains inpatient appropriate because:IV treatments appropriate due to intensity of illness or inability to take PO   Dispo: The patient is from: SNF              Anticipated d/c is to: to be determined              Anticipated d/c date is: > 3 days              Patient currently is not medically stable to d/c.        Barriers For Discharge :   Consults  :  Neurology  Procedures  : None  DVT Prophylaxis  :  [place] lovenox  Lab Results  Component Value Date   PLT 153 08/07/2019    Antibiotics  :    Anti-infectives (From admission, onward)   Start     Dose/Rate Route Frequency Ordered Stop   08/07/19 1000  vancomycin (VANCOREADY) IVPB 1250 mg/250 mL     Discontinue     1,250 mg 166.7 mL/hr  over 90 Minutes Intravenous Every 24 hours 08/06/19 1500     08/06/19 2345  meropenem (MERREM) 2 g in sodium chloride 0.9 % 100 mL IVPB     Discontinue     2 g 200 mL/hr over 30 Minutes Intravenous Every 12 hours 08/06/19 1502     08/06/19 2200  vancomycin (VANCOREADY) IVPB 750 mg/150 mL  Status:  Discontinued        750 mg 150 mL/hr over 60 Minutes Intravenous Every 12 hours 08/06/19 0817 08/06/19 1500    08/06/19 1000  cefTRIAXone (ROCEPHIN) 1 g in sodium chloride 0.9 % 100 mL IVPB  Status:  Discontinued        1 g 200 mL/hr over 30 Minutes Intravenous Every 24 hours 08/15/2019 1104 08/06/19 0739   08/06/19 1000  acyclovir (ZOVIRAX) 775 mg in dextrose 5 % 150 mL IVPB     Discontinue     10 mg/kg  77.6 kg (Ideal) 165.5 mL/hr over 60 Minutes Intravenous Every 12 hours 08/06/19 0754     08/06/19 0900  vancomycin (VANCOREADY) IVPB 2000 mg/400 mL        2,000 mg 200 mL/hr over 120 Minutes Intravenous  Once 08/06/19 0816 08/06/19 1232   08/06/19 0800  meropenem (MERREM) 1 g in sodium chloride 0.9 % 100 mL IVPB  Status:  Discontinued        1 g 200 mL/hr over 30 Minutes Intravenous Every 8 hours 08/06/19 0754 08/06/19 1502   08/06/19 0745  cefTRIAXone (ROCEPHIN) 2 g in sodium chloride 0.9 % 100 mL IVPB  Status:  Discontinued        2 g 200 mL/hr over 30 Minutes Intravenous Every 12 hours 08/06/19 0739 08/06/19 1448   08/13/2019 1115  metroNIDAZOLE (FLAGYL) IVPB 500 mg  Status:  Discontinued        500 mg 100 mL/hr over 60 Minutes Intravenous Every 8 hours 08/02/2019 1104 08/14/2019 1110   08/07/2019 1115  doxycycline (VIBRA-TABS) tablet 100 mg  Status:  Discontinued        100 mg Oral Every 12 hours 08/27/2019 1112 08/07/19 0751   08/18/2019 0915  cefTRIAXone (ROCEPHIN) 1 g in sodium chloride 0.9 % 100 mL IVPB        1 g 200 mL/hr over 30 Minutes Intravenous  Once 09/01/2019 0901 08/07/2019 1123   08/08/2019 0915  azithromycin (ZITHROMAX) 500 mg in sodium chloride 0.9 % 250 mL IVPB  Status:  Discontinued        500 mg 250 mL/hr over 60 Minutes Intravenous  Once 08/29/2019 0901 08/31/2019 1449        Objective:   Vitals:   08/07/19 1115 08/07/19 1120 08/07/19 1247 08/07/19 1548  BP:  (!) 148/77  (!) 129/91  Pulse:  (!) 123  (!) 114  Resp:  (!) 26  (!) 22  Temp: (!) 101.1 F (38.4 C)  (!) 100.9 F (38.3 C) (!) 101 F (38.3 C)  TempSrc: Rectal  Rectal Rectal  SpO2:  97%  98%  Weight:      Height:         Wt Readings from Last 3 Encounters:  08/07/19 90.4 kg  07/17/19 95.3 kg  03/10/18 108 kg     Intake/Output Summary (Last 24 hours) at 08/07/2019 1601 Last data filed at 08/07/2019 1035 Gross per 24 hour  Intake 1612.69 ml  Output 1775 ml  Net -162.31 ml     Physical Exam   Comatose, unresponsive, nonverbal, he remains on BiPAP  Symmetrical Chest wall movement, fair air entry bilaterally, no rales or rhonchi RRR,No Gallops,Rubs or new Murmurs, No Parasternal Heave +ve B.Sounds, Abd Soft, No tenderness, No rebound - guarding or rigidity. Patient with bilateral heel ulcers  Pictures on 7/5          Data Review:    CBC Recent Labs  Lab 08/09/2019 0715 08/07/2019 0804 08/06/19 0608 08/07/19 0239  WBC 10.8*  --  8.1 8.1  HGB 8.7* 9.2* 9.0* 8.7*  HCT 30.0* 27.0* 31.2* 30.3*  PLT 166  --  170 153  MCV 99.7  --  98.4 97.4  MCH 28.9  --  28.4 28.0  MCHC 29.0*  --  28.8* 28.7*  RDW 16.4*  --  16.4* 15.9*  LYMPHSABS 0.3*  --   --   --   MONOABS 0.7  --   --   --   EOSABS 0.0  --   --   --   BASOSABS 0.0  --   --   --     Chemistries  Recent Labs  Lab 08/08/2019 0715 08/29/2019 0804 08/06/19 0608 08/06/19 1558 08/07/19 0239  NA 141 145 146* 145 141  K 3.4* 3.6 2.7* 3.0* 3.2*  CL 100  --  100 100 99  CO2 30  --  30 32 31  GLUCOSE 88  --  153* 150* 221*  BUN 41*  --  33* 49* 26*  CREATININE 1.71*  --  1.60* 1.38* 1.33*  CALCIUM 8.6*  --  8.7* 8.6* 8.7*  MG  --   --  2.4  --  2.5*  AST 50*  --   --   --   --   ALT 19  --   --   --   --   ALKPHOS 76  --   --   --   --   BILITOT 0.8  --   --   --   --    ------------------------------------------------------------------------------------------------------------------ No results for input(s): CHOL, HDL, LDLCALC, TRIG, CHOLHDL, LDLDIRECT in the last 72 hours.  Lab Results  Component Value Date   HGBA1C 5.0 08/06/2019    ------------------------------------------------------------------------------------------------------------------ No results for input(s): TSH, T4TOTAL, T3FREE, THYROIDAB in the last 72 hours.  Invalid input(s): FREET3 ------------------------------------------------------------------------------------------------------------------ Recent Labs    08/06/19 0608  TIBC 309  IRON 13*    Coagulation profile No results for input(s): INR, PROTIME in the last 168 hours.  No results for input(s): DDIMER in the last 72 hours.  Cardiac Enzymes No results for input(s): CKMB, TROPONINI, MYOGLOBIN in the last 168 hours.  Invalid input(s): CK ------------------------------------------------------------------------------------------------------------------    Component Value Date/Time   BNP 328.3 (H) 08/24/2019 0715    Inpatient Medications  Scheduled Meds: . ARIPiprazole  2 mg Oral Daily  . Chlorhexidine Gluconate Cloth  6 each Topical Daily  . enoxaparin (LOVENOX) injection  40 mg Subcutaneous Q24H  . insulin aspart  0-9 Units Subcutaneous TID WC  . levalbuterol  0.63 mg Nebulization TID  . pantoprazole (PROTONIX) IV  40 mg Intravenous Q24H   Continuous Infusions: . acyclovir 775 mg (08/07/19 0853)  . dextrose 5 % with KCl 20 mEq / L 20 mEq (08/06/19 2350)  . meropenem (MERREM) IV 2 g (08/07/19 1023)  . vancomycin 1,250 mg (08/07/19 1106)   PRN Meds:.acetaminophen, artificial tears, glucagon (human recombinant), metoprolol tartrate, morphine injection, ondansetron (ZOFRAN) IV  Micro Results Recent Results (from the past 240 hour(s))  SARS Coronavirus 2 by RT PCR (hospital order,  performed in [facility] hospital lab) Nasopharyngeal Nasopharyngeal Swab     Status: None   Collection Time: 08/16/2019  7:15 AM   Specimen: Nasopharyngeal Swab  Result Value Ref Range Status   SARS Coronavirus 2 NEGATIVE NEGATIVE Final    Comment: (NOTE) SARS-CoV-2 target nucleic acids are NOT  DETECTED.  The SARS-CoV-2 RNA is generally detectable in upper and lower respiratory specimens during the acute phase of infection. The lowest concentration of SARS-CoV-2 viral copies this assay can detect is 250 copies / mL. A negative result does not preclude SARS-CoV-2 infection and should not be used as the sole basis for treatment or other patient management decisions.  A negative result may occur with improper specimen collection / handling, submission of specimen other than nasopharyngeal swab, presence of viral mutation(s) within the areas targeted by this assay, and inadequate number of viral copies (<250 copies / mL). A negative result must be combined with clinical observations, patient history, and epidemiological information.  Fact Sheet for Patients:   StrictlyIdeas.no  Fact Sheet for Healthcare Providers: BankingDealers.co.za  This test is not yet approved or  cleared by the [place] FDA and has been authorized for detection and/or diagnosis of SARS-CoV-2 by FDA under an Emergency Use Authorization (EUA).  This EUA will remain in effect (meaning this test can be used) for the duration of the COVID-19 declaration under Section 564(b)(1) of the Act, 21 U.S.C. section 360bbb-3(b)(1), unless the authorization is terminated or revoked sooner.  Performed at [facility], [place] [address], [place], [place] [address]   Culture, blood (routine x 2)     Status: None (Preliminary result)   Collection Time: 08/30/2019  7:20 AM   Specimen: BLOOD RIGHT ARM  Result Value Ref Range Status   Specimen Description BLOOD RIGHT ARM  Final   Special Requests   Final    BOTTLES DRAWN AEROBIC AND ANAEROBIC Blood Culture adequate volume   Culture   Final    NO GROWTH 2 DAYS Performed at [facility], [place] [address], [place], [place] [address]    Report Status PENDING  Incomplete  Culture, blood (routine x 2)     Status: None  (Preliminary result)   Collection Time: 09/01/2019  7:30 AM   Specimen: BLOOD RIGHT HAND  Result Value Ref Range Status   Specimen Description BLOOD RIGHT HAND  Final   Special Requests   Final    BOTTLES DRAWN AEROBIC AND ANAEROBIC Blood Culture adequate volume   Culture   Final    NO GROWTH 2 DAYS Performed at [name], [place] [address] [place], [place] [address]    Report Status PENDING  Incomplete    Radiology Reports DG Chest 1 View  Result Date: 08/06/2019 CLINICAL DATA:  Fever. EXAM: CHEST  1 VIEW COMPARISON:  Single-view of the chest 08/25/2019 01/23/2018. FINDINGS: Aeration is improved since the most recent examination. Mild, hazy opacity over the lower right chest is suggestive of a small effusion. Heart size is enlarged. Aortic atherosclerosis noted. No pneumothorax. IMPRESSION: Improved aeration bilaterally. Likely small right pleural effusion. Electronically Signed   By: Inge Rise M.D.   On: 08/06/2019 12:26   DG Chest 1 View  Result Date: 08/02/2019 CLINICAL DATA:  Shortness of breath starting this morning EXAM: CHEST  1 VIEW COMPARISON:  January 23, 2018 FINDINGS: The mediastinal contour is normal. The heart size is mildly enlarged. Pulmonary increased interstitium with the enlarged central pulmonary vessel caliber are identified  bilaterally. No focal pneumonia or pleural effusion is noted. Bony structures are stable. IMPRESSION: congestive heart failure. Electronically Signed   By: Abelardo Diesel M.D.   On: 08/16/2019 07:24   DG Shoulder Right  Result Date: 07/21/2019 CLINICAL DATA:  Pain EXAM: RIGHT SHOULDER - 2+ VIEW COMPARISON:  None. FINDINGS: There is no evidence of fracture or dislocation. Advanced right shoulder osteoarthritis is seen with joint space loss and marginal osteophyte formation. There is diffuse osteopenia. IMPRESSION: No acute osseous abnormality. Electronically Signed   By: Prudencio Pair M.D.   On: 07/21/2019 01:45   DG Abd 1 View  Result  Date: 08/06/2019 CLINICAL DATA:  Evaluate for possible metallic foreign body EXAM: ABDOMEN - 1 VIEW COMPARISON:  None. FINDINGS: Scattered large and small bowel gas is noted. No abnormal mass or abnormal calcifications are noted. No free air is seen. Fixation screws are noted in the proximal right femur. Fiducial markers are noted in the prostate. Degenerative changes of the lumbar spine are noted. No other focal abnormality is noted. IMPRESSION: No radiopaque foreign body is identified to preclude MR imaging. Postsurgical changes are seen. Electronically Signed   By: Inez Catalina M.D.   On: 08/06/2019 01:48   CT Head Wo Contrast  Result Date: 08/07/2019 CLINICAL DATA:  Altered mental status, unclear cause. Additional history provided: Unresponsive, hypoglycemic. EXAM: CT HEAD WITHOUT CONTRAST TECHNIQUE: Contiguous axial images were obtained from the base of the skull through the vertex without intravenous contrast. COMPARISON:  Prior head CT 09/08/2017, brain MRI 01/27/2016 FINDINGS: Brain: Stable, mild generalized parenchymal atrophy. Stable, mild ill-defined hypoattenuation within the cerebral white matter which is nonspecific, but consistent with chronic small vessel ischemic disease. Redemonstrated chronic lacunar infarcts within the right basal ganglia and left thalamus. There is no acute intracranial hemorrhage. No acute demarcated cortical infarct is identified. No extra-axial fluid collection. No evidence of intracranial mass. No midline shift. Vascular: No hyperdense vessel.  Atherosclerotic calcifications. Skull: Normal. Negative for fracture or focal lesion. Sinuses/Orbits: Visualized orbits show no acute finding. Mild paranasal sinus mucosal thickening at the imaged levels, most notably ethmoid. No significant mastoid effusion. IMPRESSION: No CT evidence of acute intracranial abnormality. Stable, mild generalized parenchymal atrophy and chronic small vessel ischemic disease. Redemonstrated chronic  lacunar infarcts within the right basal ganglia and left thalamus. Mild paranasal sinus mucosal thickening. Electronically Signed   By: Kellie Simmering DO   On: 08/30/2019 14:49   MR BRAIN WO CONTRAST  Result Date: 08/06/2019 CLINICAL DATA:  Initial evaluation for acute encephalopathy. EXAM: MRI HEAD WITHOUT CONTRAST TECHNIQUE: Multiplanar, multiecho pulse sequences of the brain and surrounding structures were obtained without intravenous contrast. COMPARISON:  Prior head CT from 08/27/2019. FINDINGS: Brain: Generalized age-related cerebral atrophy. Mild scattered patchy T2/FLAIR hyperintensity within the periventricular deep white matter both cerebral hemispheres most consistent with chronic small vessel ischemic disease, mild for age. Remote lacunar infarct noted at the right frontal corona radiata. There is a punctate focus of restricted diffusion involving the cortical gray matter of the anterior left frontal lobe (series 5, image 88). Additional punctate focus of diffusion abnormality seen involving the cortical gray matter of the posterior right parietal lobe (series 5, image 82). Finding suspicious for tiny acute ischemic infarcts. No associated hemorrhage or mass effect. Few additional somewhat vague foci of diffusion abnormality involving the hemispheric cerebral white matter favored to reflect T2 shine through. No other evidence for acute ischemia. Gray-white matter differentiation maintained. No encephalomalacia to suggest chronic cortical infarction. No evidence  for acute intracranial hemorrhage. Few punctate foci of susceptibility artifact noted, suggesting small chronic microhemorrhages, likely small vessel related. No mass lesion, midline shift or mass effect. No hydrocephalus or extra-axial fluid collection. Pituitary gland suprasellar region normal. Midline structures intact. Vascular: Major intracranial vascular flow voids are maintained. Skull and upper cervical spine: Craniocervical junction  within normal limits. Bone marrow signal intensity normal. No scalp soft tissue abnormality. Sinuses/Orbits: Patient status post bilateral ocular lens replacement. Mild scattered mucosal thickening noted within the sphenoethmoidal sinuses. Mastoid air cells are clear. Middle ear cavities grossly within normal limits. Other: None. IMPRESSION: 1. Two punctate foci of restricted diffusion involving the cortical gray matter of the anterior left frontal and posterior right parietal lobes as above, suspicious for tiny acute ischemic infarcts. No associated hemorrhage or mass effect. 2. No other acute intracranial abnormality. 3. Age-related cerebral atrophy with underlying mild chronic small vessel ischemic disease. Electronically Signed   By: Jeannine Boga M.D.   On: 08/06/2019 03:43   DG Knee Complete 4 Views Right  Result Date: 07/21/2019 CLINICAL DATA:  Pain EXAM: RIGHT KNEE - COMPLETE 4+ VIEW COMPARISON:  None. FINDINGS: No fracture or dislocation. There is tricompartmental osteoarthritis, most notable in the medial compartment and patellofemoral compartment. No large knee joint effusion. There is soft tissue swelling over the medial aspect of the knee. Scattered vascular calcifications are noted. IMPRESSION: No acute osseous abnormality. Electronically Signed   By: Prudencio Pair M.D.   On: 07/21/2019 01:51   EEG adult  Result Date: 08/13/2019 Philemon Kingdom, MD     08/07/2019 12:17 PM Date of Study: 08/20/2019 Reason for Study:  Pt is a 69 YOM with PMH of DM II, PROSTATE CA, OSA, HTN, A.FIB who presented with AMS. Eval for seizures. Description of recording: This recording was obtained using a Digital EEG system with 18 channel capacity . Standard bipolar and referential EEG montages were used following the International 10 - 20 System . This is a digitally recorded EEG with duration of approximately ~ 23:28 minutes. There is diffuse background slowing consisting of delta rhythm without any well  defined posterior dominant alpha rhythm. There were no epileptiform discharges or electrographic seizures noted. Hyperventilation was not performed. Photic stimulation not was performed. unable to interpret EKG. IMPRESSION: This is an abnormal EEG for age due to diffuse background slowing which can be seen with toxic/metabolic encephalopathy, cerebral ischemia, intracerebral hemorrhage, tumors, traumatic brain injury, cortical malformations, or other cerebral dysfunction. There were no clinical seizures or epileptiform discharges noted during study. Clinical correlation is advised. I have personally reviewed this study.   ECHOCARDIOGRAM COMPLETE  Result Date: 08/06/2019    ECHOCARDIOGRAM REPORT   Patient Name:   Joel [name] Date of Exam: 08/06/2019 Medical Rec #:  [id number]      Height:       72.0 in Accession #:    [id number]     Weight:       210.1 lb Date of Birth:  28-May-1932      BSA:          2.176 m Patient Age:    64 years       BP:           131/68 mmHg Patient Gender: M              HR:           99 bpm. Exam Location:  Inpatient Procedure: 2D Echo, Color Doppler and Cardiac Doppler Indications:  Aortic Stenosis i35.0  History:        Patient has prior history of Echocardiogram examinations, most                 recent 09/01/2017. CHF, COPD; Risk Factors:Hypertension, Diabetes,                 Dyslipidemia and Sleep Apnea.  Sonographer:    Raquel Sarna Senior RDCS Referring Phys: [id number] Lequita Halt  Sonographer Comments: Very difficult windows, patient scanned supine on bi-pap. Majority of study performed from subcostal. IMPRESSIONS  1. Left ventricular ejection fraction, by estimation, is 50 to 55%. The left ventricle has low normal function. The left ventricle demonstrates regional wall motion abnormalities (see scoring diagram/findings for description). Septum appears hypokinesis  to dyskinetic in some views. Left ventricular diastolic parameters are indeterminate.  2. Right ventricular systolic function  is mildly reduced. The right ventricular size is mildly enlarged. Tricuspid regurgitation signal is inadequate for assessing PA pressure.  3. Left atrial size was mildly dilated.  4. Right atrial size was mildly dilated.  5. The mitral valve is abnormal, mildly thickened and calcified. Trivial mitral valve regurgitation.  6. The aortic valve was not well visualized, mildly to moderately calcified. Aortic valve regurgitation is not visualized. Suspect moderate aortic valve stenosis based on the following parameters, but leaflet structure not well defined. Aortic valve area, by VTI measures 1.17 cm. Aortic valve mean gradient measures 19.0 mmHg. Aortic valve Vmax measures 2.87 m/s. Dimentionless index.  7. The inferior vena cava is dilated in size with <50% respiratory variability, suggesting right atrial pressure of 15 mmHg. FINDINGS  Left Ventricle: Left ventricular ejection fraction, by estimation, is 50 to 55%. The left ventricle has low normal function. The left ventricle demonstrates regional wall motion abnormalities. The left ventricular internal cavity size was normal in size. There is borderline left ventricular hypertrophy. Left ventricular diastolic parameters are indeterminate.  LV Wall Scoring: The mid and distal anterior septum and mid inferoseptal segment are hypokinetic. Right Ventricle: The right ventricular size is mildly enlarged. No increase in right ventricular wall thickness. Right ventricular systolic function is mildly reduced. Tricuspid regurgitation signal is inadequate for assessing PA pressure. Left Atrium: Left atrial size was mildly dilated. Right Atrium: Right atrial size was mildly dilated. Pericardium: The pericardium was not assessed. Mitral Valve: The mitral valve is abnormal. There is mild thickening of the mitral valve leaflet(s). Mild to moderate mitral annular calcification. Trivial mitral valve regurgitation. Tricuspid Valve: The tricuspid valve is grossly normal. Tricuspid  valve regurgitation is trivial. Aortic Valve: The aortic valve was not well visualized. Aortic valve regurgitation is not visualized. Moderate aortic stenosis is present. Aortic valve mean gradient measures 19.0 mmHg. Aortic valve peak gradient measures 32.9 mmHg. Aortic valve area, by  VTI measures 1.17 cm. Pulmonic Valve: The pulmonic valve was not well visualized. Pulmonic valve regurgitation is trivial. Aorta: The aortic root is normal in size and structure. Venous: The inferior vena cava is dilated in size with less than 50% respiratory variability, suggesting right atrial pressure of 15 mmHg. IAS/Shunts: No atrial level shunt detected by color flow Doppler.  LEFT VENTRICLE PLAX 2D LVIDd:         5.30 cm LVIDs:         4.00 cm LV PW:         1.00 cm LV IVS:        0.90 cm LVOT diam:     2.00 cm LV SV:  57 LV SV Index:   26 LVOT Area:     3.14 cm  RIGHT VENTRICLE RV S prime:     5.77 cm/s TAPSE (M-mode): 0.7 cm LEFT ATRIUM              Index       RIGHT ATRIUM           Index LA diam:        5.20 cm  2.39 cm/m  RA Area:     27.10 cm LA Vol (A2C):   112.0 ml 51.48 ml/m RA Volume:   85.20 ml  39.16 ml/m LA Vol (A4C):   82.1 ml  37.74 ml/m LA Biplane Vol: 96.7 ml  44.45 ml/m  AORTIC VALVE AV Area (Vmax):    1.12 cm AV Area (Vmean):   1.15 cm AV Area (VTI):     1.17 cm AV Vmax:           286.67 cm/s AV Vmean:          200.000 cm/s AV VTI:            0.485 m AV Peak Grad:      32.9 mmHg AV Mean Grad:      19.0 mmHg LVOT Vmax:         102.00 cm/s LVOT Vmean:        73.100 cm/s LVOT VTI:          0.180 m LVOT/AV VTI ratio: 0.37  AORTA Ao Root diam: 3.30 cm  SHUNTS Systemic VTI:  0.18 m Systemic Diam: 2.00 cm Rozann Lesches MD Electronically signed by Rozann Lesches MD Signature Date/Time: 08/06/2019/3:41:03 PM    Final    DG Hip Unilat W or Wo Pelvis 2-3 Views Left  Result Date: 07/21/2019 CLINICAL DATA:  Pain. Fall off toilet while using the bathroom. EXAM: DG HIP (WITH OR WITHOUT PELVIS) 2-3V  LEFT COMPARISON:  None. FINDINGS: Right hip better assessed on concurrent right hip radiograph, performed concurrently. No evidence of pelvis or left hip fracture. Left femoral head is well seated in the acetabulum. Pubic rami are intact. Pubic symphysis and sacroiliac joints are congruent. Surgical clips in the region of the prostate. IMPRESSION: No fracture of the pelvis or left hip. Electronically Signed   By: Keith Rake M.D.   On: 07/21/2019 01:53   DG Hip Unilat W or Wo Pelvis 2-3 Views Right  Result Date: 07/21/2019 CLINICAL DATA:  Pain. Fall off toilet while using the bathroom. Right hip and knee pain. EXAM: DG HIP (WITH OR WITHOUT PELVIS) 2-3V RIGHT COMPARISON:  Pelvis and hip radiograph 01/23/2018 FINDINGS: Mild a screw fixation of remote proximal femur fracture. The screws extend into the soft tissues approximately 3.6 cm laterally, which appears slightly increased from prior exam. Remote femoral neck fracture in unchanged alignment. Femoral head remains seated. No evidence of acute hip fracture. The pubic rami are intact. IMPRESSION: 1. No acute fracture of the pelvis or right hip. 2. Screw fixation of remote proximal femur fracture. The screws extend into the lateral soft tissues, which appears slightly increased from prior exam. Prior femoral neck fracture is unchanged in alignment. Electronically Signed   By: Keith Rake M.D.   On: 07/21/2019 01:52   VAS [place] LOWER EXTREMITY VENOUS REFLUX  Result Date: 07/17/2019  Lower Venous Reflux Study Indications: Swelling, Edema, and Pain.  Performing Technologist: Delorise Shiner RVT  Examination Guidelines: A complete evaluation includes B-mode imaging, spectral Doppler, color Doppler, and power Doppler as  needed of all accessible portions of each vessel. Bilateral testing is considered an integral part of a complete examination. Limited examinations for reoccurring indications may be performed as noted. The reflux portion of the exam is  performed with the patient in reverse Trendelenburg. Significant venous reflux is defined as >500 ms in the superficial venous system, and >1 second in the deep venous system.  Venous Reflux Times +---------------+---------+------+-----------+------------+--------+ RIGHT          Reflux NoRefluxReflux TimeDiameter cmsComments                          Yes                                  +---------------+---------+------+-----------+------------+--------+ CFV            no                                             +---------------+---------+------+-----------+------------+--------+ FV prox        no                                             +---------------+---------+------+-----------+------------+--------+ FV mid         no                                             +---------------+---------+------+-----------+------------+--------+ FV dist        no                                             +---------------+---------+------+-----------+------------+--------+ Popliteal      no                                             +---------------+---------+------+-----------+------------+--------+ GSV at [facility]     no                           0.751             +---------------+---------+------+-----------+------------+--------+ GSV prox thigh no                           0.472             +---------------+---------+------+-----------+------------+--------+ GSV mid thigh  no                           0.575             +---------------+---------+------+-----------+------------+--------+ GSV dist thigh no                           0.411             +---------------+---------+------+-----------+------------+--------+  GSV at knee    no                           0.365             +---------------+---------+------+-----------+------------+--------+ GSV prox calf                               0.269              +---------------+---------+------+-----------+------------+--------+ GSV mid calf                                0.217             +---------------+---------+------+-----------+------------+--------+ SSV Pop Fossa                               0.138             +---------------+---------+------+-----------+------------+--------+ SSV prox calf  no                           0.143             +---------------+---------+------+-----------+------------+--------+ SSV mid calf   no                           0.143             +---------------+---------+------+-----------+------------+--------+ AAGSV prx thighno                           0.315             +---------------+---------+------+-----------+------------+--------+ AAGSV mid thighno                           0.297             +---------------+---------+------+-----------+------------+--------+  +--------------+---------+------+-----------+------------+--------+ LEFT          Reflux NoRefluxReflux TimeDiameter cmsComments                         Yes                                  +--------------+---------+------+-----------+------------+--------+ CFV           no                                             +--------------+---------+------+-----------+------------+--------+ FV prox       no                                             +--------------+---------+------+-----------+------------+--------+ FV mid        no                                             +--------------+---------+------+-----------+------------+--------+  FV dist       no                                             +--------------+---------+------+-----------+------------+--------+ Popliteal     no                                             +--------------+---------+------+-----------+------------+--------+ GSV at [facility]    no                           0.713              +--------------+---------+------+-----------+------------+--------+ GSV prox thighno                           0.842             +--------------+---------+------+-----------+------------+--------+ GSV mid thigh no                           0.561             +--------------+---------+------+-----------+------------+--------+ GSV dist thighno                           0.429             +--------------+---------+------+-----------+------------+--------+ GSV at knee   no                           0.416             +--------------+---------+------+-----------+------------+--------+ GSV prox calf                              0.292             +--------------+---------+------+-----------+------------+--------+ GSV mid calf                               0.415             +--------------+---------+------+-----------+------------+--------+ SSV Pop Fossa                              0.283             +--------------+---------+------+-----------+------------+--------+ SSV prox calf no                           0.216             +--------------+---------+------+-----------+------------+--------+ SSV mid calf  no                           0.135             +--------------+---------+------+-----------+------------+--------+   Summary: Bilateral: - No evidence of deep vein thrombosis seen in the lower extremities, bilaterally, from the common femoral through the popliteal veins. - No evidence of deep venous insufficiency seen bilaterally in the lower extremity. - No evidence of superficial  venous reflux seen in the greater saphenous veins bilaterally. - No evidence of superficial venous reflux seen in the short saphenous veins bilaterally.  Right: - Venous Doppler signals are pulsatile bilaterally.  Left: - Color duplex evaluation of the left lower extremity shows there is thrombus in the lesser saphenous vein. - The distal left small saphenous vein is non compressible  consistent with chronic thrombus. Venous Doppler signals are pulsatile bilaterally.  *See table(s) above for measurements and observations. Electronically signed by Monica Martinez MD on 07/17/2019 at 5:04:08 PM.    Final      Phillips Climes M.D on 08/07/2019 at 4:01 PM    [facility] -  Office  [phone]

## 2019-08-07 NOTE — Progress Notes (Signed)
Pt T rechecked after PRN tylenol given for previous temp at 101.78F. Pt. T at 101.50F. MD informed that pt is still febrile even with tylenol and ice packs placed on patient. Cooling blanket ordered and placed on pt.

## 2019-08-08 DIAGNOSIS — E162 Hypoglycemia, unspecified: Secondary | ICD-10-CM

## 2019-08-08 DIAGNOSIS — Z66 Do not resuscitate: Secondary | ICD-10-CM

## 2019-08-08 DIAGNOSIS — Z515 Encounter for palliative care: Secondary | ICD-10-CM

## 2019-08-08 DIAGNOSIS — J9621 Acute and chronic respiratory failure with hypoxia: Secondary | ICD-10-CM

## 2019-08-08 DIAGNOSIS — J440 Chronic obstructive pulmonary disease with acute lower respiratory infection: Secondary | ICD-10-CM

## 2019-08-08 DIAGNOSIS — J810 Acute pulmonary edema: Secondary | ICD-10-CM

## 2019-08-08 DIAGNOSIS — J209 Acute bronchitis, unspecified: Secondary | ICD-10-CM

## 2019-08-08 LAB — BASIC METABOLIC PANEL
Anion gap: 8 (ref 5–15)
BUN: 27 mg/dL — ABNORMAL HIGH (ref 8–23)
CO2: 30 mmol/L (ref 22–32)
Calcium: 8.6 mg/dL — ABNORMAL LOW (ref 8.9–10.3)
Chloride: 101 mmol/L (ref 98–111)
Creatinine, Ser: 1.11 mg/dL (ref 0.61–1.24)
GFR calc Af Amer: >60 mL/min (ref >60)
GFR calc non Af Amer: >60 mL/min (ref >60)
Glucose, Bld: 195 mg/dL — ABNORMAL HIGH (ref 70–99)
Potassium: 4.5 mmol/L (ref 3.5–5.1)
Sodium: 139 mmol/L (ref 135–145)

## 2019-08-08 LAB — CBC
HCT: 31 % — ABNORMAL LOW (ref 39.0–52.0)
Hemoglobin: 8.8 g/dL — ABNORMAL LOW (ref 13.0–17.0)
MCH: 27.8 pg (ref 26.0–34.0)
MCHC: 28.4 g/dL — ABNORMAL LOW (ref 30.0–36.0)
MCV: 97.8 fL (ref 80.0–100.0)
Platelets: 172 10*3/uL (ref 150–400)
RBC: 3.17 MIL/uL — ABNORMAL LOW (ref 4.22–5.81)
RDW: 15.6 % — ABNORMAL HIGH (ref 11.5–15.5)
WBC: 9.5 10*3/uL (ref 4.0–10.5)
nRBC: 0 % (ref 0.0–0.2)

## 2019-08-08 LAB — GLUCOSE, CAPILLARY
Glucose-Capillary: 170 mg/dL — ABNORMAL HIGH (ref 70–99)
Glucose-Capillary: 150 mg/dL — ABNORMAL HIGH (ref 70–99)

## 2019-08-08 LAB — PROCALCITONIN: Procalcitonin: 1.07 ng/mL

## 2019-08-08 MED ORDER — LORAZEPAM 2 MG/ML IJ SOLN
1.0000 mg | INTRAMUSCULAR | Status: DC | PRN
  Administered 2019-08-08: 1 mg via INTRAVENOUS

## 2019-08-08 MED ORDER — LORAZEPAM 1 MG PO TABS: 1.0000 mg | ORAL_TABLET | ORAL | Status: DC | PRN

## 2019-08-08 MED ORDER — HALOPERIDOL LACTATE 5 MG/ML IJ SOLN: 0.5000 mg | INTRAMUSCULAR | Status: DC | PRN

## 2019-08-08 MED ORDER — MORPHINE BOLUS VIA INFUSION
1.0000 mg | INTRAVENOUS | Status: DC | PRN
  Administered 2019-08-08 (×2): 2 mg via INTRAVENOUS
  Filled 2019-08-08: qty 2

## 2019-08-08 MED ORDER — HALOPERIDOL LACTATE 2 MG/ML PO CONC
0.5000 mg | ORAL | Status: DC | PRN
  Filled 2019-08-08: qty 0.3

## 2019-08-08 MED ORDER — LORAZEPAM 2 MG/ML IJ SOLN: 0.5000 mg | Freq: Once | INTRAMUSCULAR | Status: AC

## 2019-08-08 MED ORDER — POLYVINYL ALCOHOL 1.4 % OP SOLN
1.0000 [drp] | Freq: Four times a day (QID) | OPHTHALMIC | Status: DC | PRN
  Filled 2019-08-08: qty 15

## 2019-08-08 MED ORDER — ALBUTEROL SULFATE (2.5 MG/3ML) 0.083% IN NEBU: 2.5000 mg | INHALATION_SOLUTION | RESPIRATORY_TRACT | Status: DC | PRN

## 2019-08-08 MED ORDER — ONDANSETRON HCL 4 MG/2ML IJ SOLN: 4.0000 mg | Freq: Four times a day (QID) | INTRAMUSCULAR | Status: DC | PRN

## 2019-08-08 MED ORDER — LORAZEPAM 2 MG/ML IJ SOLN
1.0000 mg | INTRAMUSCULAR | Status: DC | PRN
  Filled 2019-08-08: qty 1

## 2019-08-08 MED ORDER — LORAZEPAM 2 MG/ML IJ SOLN
INTRAMUSCULAR | Status: AC
  Administered 2019-08-08: 0.5 mg via INTRAVENOUS
  Filled 2019-08-08: qty 1

## 2019-08-08 MED ORDER — LORAZEPAM 2 MG/ML IJ SOLN: 1.0000 mg | INTRAMUSCULAR | Status: DC | PRN

## 2019-08-08 MED ORDER — HALOPERIDOL 0.5 MG PO TABS
0.5000 mg | ORAL_TABLET | ORAL | Status: DC | PRN
  Filled 2019-08-08: qty 1

## 2019-08-08 MED ORDER — LORAZEPAM 2 MG/ML PO CONC: 1.0000 mg | ORAL | Status: DC | PRN

## 2019-08-08 MED ORDER — BIOTENE DRY MOUTH MT LIQD: 15.0000 mL | OROMUCOSAL | Status: DC | PRN

## 2019-08-08 MED ORDER — SODIUM CHLORIDE 0.9 % IV SOLN
12.5000 mg | Freq: Four times a day (QID) | INTRAVENOUS | Status: DC | PRN
  Filled 2019-08-08: qty 0.5

## 2019-08-08 MED ORDER — MORPHINE 100MG IN NS 100ML (1MG/ML) PREMIX INFUSION
1.0000 mg/h | INTRAVENOUS | Status: DC
  Administered 2019-08-08: 1 mg/h via INTRAVENOUS
  Administered 2019-08-09 (×2): 6 mg/h via INTRAVENOUS
  Filled 2019-08-08 (×3): qty 100

## 2019-08-08 MED ORDER — DIPHENHYDRAMINE HCL 50 MG/ML IJ SOLN: 12.5000 mg | INTRAMUSCULAR | Status: DC | PRN

## 2019-08-08 MED ORDER — SODIUM CHLORIDE 0.9 % IV SOLN
2.0000 g | Freq: Three times a day (TID) | INTRAVENOUS | Status: DC
  Administered 2019-08-08: 2 g via INTRAVENOUS
  Filled 2019-08-08 (×3): qty 2

## 2019-08-08 MED ORDER — ONDANSETRON 4 MG PO TBDP: 4.0000 mg | ORAL_TABLET | Freq: Four times a day (QID) | ORAL | Status: DC | PRN

## 2019-08-08 MED ORDER — DEXTROSE 5 % IV SOLN
10.0000 mg/kg | Freq: Three times a day (TID) | INTRAVENOUS | Status: DC
  Filled 2019-08-08 (×3): qty 15.5

## 2019-08-08 MED ORDER — VANCOMYCIN HCL 750 MG/150ML IV SOLN
750.0000 mg | Freq: Two times a day (BID) | INTRAVENOUS | Status: DC
  Administered 2019-08-08: 750 mg via INTRAVENOUS
  Filled 2019-08-08 (×2): qty 150

## 2019-08-08 MED ORDER — MAGIC MOUTHWASH W/LIDOCAINE
15.0000 mL | Freq: Four times a day (QID) | ORAL | Status: DC | PRN
  Filled 2019-08-08 (×2): qty 15

## 2019-08-08 MED ORDER — MORPHINE BOLUS VIA INFUSION
2.0000 mg | INTRAVENOUS | Status: DC | PRN
  Administered 2019-08-08: 3 mg via INTRAVENOUS
  Administered 2019-08-09 – 2019-08-10 (×2): 2 mg via INTRAVENOUS
  Filled 2019-08-08: qty 3

## 2019-08-08 MED ORDER — ATROPINE SULFATE 1 % OP SOLN
4.0000 [drp] | OPHTHALMIC | Status: DC | PRN
  Filled 2019-08-08: qty 2

## 2019-08-08 MED ORDER — LORAZEPAM 2 MG/ML PO CONC
1.0000 mg | ORAL | Status: DC | PRN
  Administered 2019-08-09 – 2019-08-10 (×2): 1 mg via SUBLINGUAL
  Filled 2019-08-08 (×2): qty 1

## 2019-08-08 NOTE — Progress Notes (Signed)
Patient remains essentially comatose.  He does not open eyes.  He has no movement to noxious stimulation of any extremity.  He continues to be supported with BiPAP.  At this point, he has had no response to antibiotics, I think the likelihood of improving his outcome by performing invasive procedures such as lumbar puncture is extremely low.  He likely had cerebral injury due to hypoglycemia, though at this point the other etiologies under consideration would all have a relatively poor prognosis with no response to treatment for this long.  I discussed with the family that I would not pursue lumbar puncture as I would not expected to change his outcome at this point.  Family are understanding.  Palliative discussions are underway, and I think this is appropriate.  Roland Rack, MD [facility] [phone]  If 7pm- 7am, please page neurology on call as listed in [place].

## 2019-08-08 NOTE — Consult Note (Signed)
Consultation Note Date: 08/08/2019   Patient Name: Joel [name]  DOB: 1932/03/01  MRN: [id number]  Age / Sex: 86 y.o., male  PCP: Wenda Low, MD Referring Physician: Jonetta Osgood, MD  Reason for Consultation: Establishing goals of care and Psychosocial/spiritual support  HPI/Patient Profile: 86 y.o. male   admitted on 08/15/2019 with past    medical history significant of chronic A. fib, hypertension, bipolar, diastolic CHF chronic, moderate aortic stenosis, chronic O2 dependent respiratory failure, COPD, chronic back and neck pain, diabetic neuropathy, NH resident from [facility] presented for AMS/unresponsive.    Patient baseline is awake alert, chronic O2 dependent respiratory failure on 2 L oxygen.   Patient daughter was contacted 3 days ago about the patient has been having frequent nausea vomited and fever, and pneumonia was suspected and patient was started on Levaquin.  This morning, patient was found unresponsive on the floor, his sugar was 23 and hypoxic.  Patient was given intramuscular glucagon and put on 4 L oxygen.  ED Course: Patient received Narcan x1, sugar more stabilized however patient still not waking up.  Glucose 88, BUN 41, creatinine 1.7, potassium 3.4, bicarb 30, WBC 10.8, blood gas 7.45/41/152.  Chest x-ray showed pulmonary edema.  Admitted for treatment and stabilization.    Patient with severe anoxic injury from hypoglycemia, no improvement with supportive care over the past several days.  Today is day 3 of this hospitalization.  Patient is requiring BiPAP to maintain O2 sats.  Family had discussion with attending and the plan is to shift to a full comfort path allowing for a natural death once family have visited   Clinical Assessment and Goals of Care:  This NP Wadie Lessen reviewed medical records, received report from team, assessed the patient and then meet at the  patient's bedside with patient's son and daughter and several other family members to discuss EOL wishes,  disposition and options.   Concept of Palliative Care was introduced as specialized medical care for people and their families living with serious illness.  If focuses on providing relief from the symptoms and stress of a serious illness.  The goal is to improve quality of life for both the patient and the family.     Values and goals of care important to patient and family were attempted to be elicited.   All family verbalized an understanding of the limited prognosis and their hope is for comfort and dignity at this time.    Natural trajectory and expectations at EOL were discussed.  Questions and concerns addressed.  Patient  encouraged to call with questions or concerns.     PMT will continue to support holistically.     SUMMARY OF RECOMMENDATIONS    Code Status/Advance Care Planning:  DNR   Symptom Management:   Recommend continuous opioid drip once decision to shift to comfort is made.  Palliative Prophylaxis:   Eye Care, Frequent Pain Assessment and Oral Care   Psycho-social/Spiritual:   Desire for further Chaplaincy support:yes-referral made  Additional Recommendations:  emotional support offered  Prognosis:   Hours - Days  Discharge Planning: Anticipated Hospital Death      Primary Diagnoses: Present on Admission: . Acute on chronic respiratory failure with hypoxia ([place]) . Anemia, iron deficiency . (Resolved) Chronic a-fib ([place]) . COPD (chronic obstructive pulmonary disease) with acute bronchitis ([place])   I have reviewed the medical record, interviewed the patient and family, and examined the patient. The following aspects are pertinent.  Past Medical History:  Diagnosis Date  . Allergic rhinitis   . Anemia   . Anxiety   . Aortic stenosis 2012   mild to mod   . Atrial fibrillation ([place]) 1991   with multiple DCCV  . Bipolar affective disorder  ([place])   . Cancer of ascending colon ([place]) 09/05/2017  . CHF (congestive heart failure) ([place]) 08/30/2017  . COPD (chronic obstructive pulmonary disease) ([place])   . Diabetic neuropathy ([place])    in feet  . Diabetic neuropathy ([place])   . Dyslipidemia   . Fatigue    last year or so  . Gout   . Hypertension   . Insomnia   . Obesity   . On home oxygen therapy 2 1/2 liters with bipap at night  . OSA (obstructive sleep apnea)    severe, uses bipap 16 1/2 by 12 or 13 setting  . Prostate cancer ([place])   . Rectal bleeding 12/16/2015  . Type 2 diabetes mellitus ([place])   . Vertigo    Social History   Socioeconomic History  . Marital status: Widowed    Spouse name: Not on file  . Number of children: Not on file  . Years of education: Not on file  . Highest education level: Not on file  Occupational History  . Occupation: Surveyor  Tobacco Use  . Smoking status: Former Smoker    Packs/day: 2.00    Years: 25.00    Pack years: 50.00    Types: Cigarettes    Quit date: 07/08/1976    Years since quitting: 43.1  . Smokeless tobacco: Never Used  Vaping Use  . Vaping Use: Never used  Substance and Sexual Activity  . Alcohol use: No    Comment: in AA since 1977, no alcohol since 1977  . Drug use: No    Types: Marijuana    Comment: marijuana use 37 years ago  . Sexual activity: Yes  Other Topics Concern  . Not on file  Social History Narrative  . Not on file   Social Determinants of Health   Financial Resource Strain:   . Difficulty of Paying Living Expenses:   Food Insecurity:   . Worried About Charity fundraiser in the Last Year:   . Arboriculturist in the Last Year:   Transportation Needs:   . Film/video editor (Medical):   Marland Kitchen Lack of Transportation (Non-Medical):   Physical Activity:   . Days of Exercise per Week:   . Minutes of Exercise per Session:   Stress:   . Feeling of Stress :   Social Connections:   . Frequency of Communication with Friends and Family:   .  Frequency of Social Gatherings with Friends and Family:   . Attends Religious Services:   . Active Member of Clubs or Organizations:   . Attends Archivist Meetings:   Marland Kitchen Marital Status:    Family History  Problem Relation Age of Onset  . Tuberculosis Maternal Grandmother   . Heart attack Neg Hx   .  Diabetes Neg Hx   . CAD Neg Hx   . Cancer Neg Hx    Scheduled Meds: . ARIPiprazole  2 mg Oral Daily  . Chlorhexidine Gluconate Cloth  6 each Topical Daily  . enoxaparin (LOVENOX) injection  40 mg Subcutaneous Q24H  . insulin aspart  0-9 Units Subcutaneous TID WC  . pantoprazole (PROTONIX) IV  40 mg Intravenous Q24H   Continuous Infusions: . acyclovir    . dextrose 5 % with KCl 20 mEq / L 20 mEq (08/08/19 0532)  . meropenem (MERREM) IV 2 g (08/08/19 0930)  . vancomycin     PRN Meds:.acetaminophen, artificial tears, glucagon (human recombinant), levalbuterol, metoprolol tartrate, morphine injection, ondansetron (ZOFRAN) IV Medications Prior to Admission:  Prior to Admission medications   Medication Sig Start Date End Date Taking? Authorizing Provider  acetaminophen (TYLENOL) 325 MG tablet Take 650 mg by mouth every 6 (six) hours as needed for mild pain.   Yes [provider]  allopurinol (ZYLOPRIM) 100 MG tablet Take 100 mg by mouth daily.    Yes [provider]  antiseptic oral rinse (BIOTENE) LIQD 15 mLs by Mouth Rinse route every 4 (four) hours as needed for dry mouth.   Yes [provider]  ARIPiprazole (ABILIFY) 2 MG tablet Take 2 mg by mouth daily.   Yes [provider]  bisacodyl (DULCOLAX) 10 MG suppository Place 10 mg rectally daily as needed for moderate constipation.   Yes [provider]  buPROPion (WELLBUTRIN XL) 300 MG 24 hr tablet Take 300 mg by mouth daily.   Yes [provider]  citalopram (CELEXA) 20 MG tablet Take 1 tablet (20 mg total) by mouth daily. 09/17/17  Yes Lavina Hamman, MD  diltiazem (CARDIZEM  CD) 120 MG 24 hr capsule Take 1 capsule (120 mg total) by mouth daily. 09/17/17  Yes Lavina Hamman, MD  docusate sodium (COLACE) 100 MG capsule Take 100 mg by mouth 2 (two) times daily.   Yes [provider]  fentaNYL (DURAGESIC) 12 MCG/HR Place 1 patch onto the skin every 3 (three) days.   Yes [provider]  ferrous sulfate 325 (65 FE) MG tablet Take 325 mg by mouth daily with breakfast.   Yes [provider]  fluticasone (FLONASE) 50 MCG/ACT nasal spray Place 2 sprays into both nostrils daily. 01/27/16  Yes Waynetta Pean, PA-C  furosemide (LASIX) 40 MG tablet Take 40 mg by mouth daily. 08/03/19  Yes [provider]  gabapentin (NEURONTIN) 100 MG capsule Take 2 capsules (200 mg total) by mouth 2 (two) times daily. 09/16/17  Yes Lavina Hamman, MD  gabapentin (NEURONTIN) 600 MG tablet Take 600 mg by mouth at bedtime.   Yes [provider]  glimepiride (AMARYL) 2 MG tablet Take 2 mg by mouth daily with breakfast.   Yes [provider]  guaifenesin (HUMIBID E) 400 MG TABS tablet Take 400 mg by mouth every 12 (twelve) hours as needed (Congestion).   Yes [provider]  guaiFENesin (MUCINEX) 600 MG 12 hr tablet Take 600 mg by mouth 2 (two) times daily.   Yes [provider]  insulin detemir (LEVEMIR) 100 unit/ml SOLN Inject 15 Units into the skin at bedtime.   Yes [provider]  levalbuterol (XOPENEX) 0.63 MG/3ML nebulizer solution Take 0.63 mg by nebulization 3 (three) times daily.   Yes [provider]  levofloxacin (LEVAQUIN) 500 MG tablet Take 500 mg by mouth daily. For 10 days. 08/03/19 08/12/19 Yes [provider]  lovastatin (MEVACOR) 20 MG tablet Take 20 mg by mouth at bedtime.    Yes [provider]  meclizine (ANTIVERT) 25 MG tablet Take 1 tablet (25 mg total) by mouth 3 (three) times daily as needed for dizziness. Patient taking differently: Take 25 mg by mouth every 8 (eight) hours  as needed for dizziness.  01/28/16  Yes Eber Jones, MD  metolazone (ZAROXOLYN) 5 MG tablet Take 5 mg by mouth 2 (two) times a week. On Tuesday and Friday   Yes [provider]  Multiple Vitamins-Minerals (VISION FORMULA PO) Take 1 tablet by mouth daily.    Yes [provider]  nystatin (MYCOSTATIN) 100000 UNIT/ML suspension Take 5 mLs by mouth 3 (three) times daily.   Yes [provider]  pantoprazole (PROTONIX) 40 MG tablet Take 1 tablet (40 mg total) by mouth 2 (two) times daily. 09/16/17  Yes Lavina Hamman, MD  polyethylene glycol (MIRALAX / GLYCOLAX) 17 g packet Take 17 g by mouth daily as needed for mild constipation.   Yes [provider]  potassium chloride SA (K-DUR,KLOR-CON) 20 MEQ tablet Take 40 mEq by mouth 2 (two) times daily.  09/10/14  Yes [provider]  simethicone (MYLICON) 80 MG chewable tablet Chew 80 mg by mouth 3 (three) times daily with meals.   Yes [provider]  sodium chloride (OCEAN) 0.65 % SOLN nasal spray Place 1 spray into both nostrils every 2 (two) hours. As needed for dryness.   Yes [provider]  temazepam (RESTORIL) 7.5 MG capsule Take 7.5 mg by mouth at bedtime.   Yes [provider]  tiZANidine (ZANAFLEX) 2 MG tablet Take 2 mg by mouth in the morning and at bedtime.   Yes [provider]  traMADol (ULTRAM) 50 MG tablet Take 100 mg by mouth 4 (four) times daily.   Yes [provider]  OXYGEN Inhale 2 L into the lungs as needed (shortness of breath).     [provider]   Allergies  Allergen Reactions  . Penicillins Anaphylaxis    Has patient had a PCN reaction causing immediate rash, facial/tongue/throat swelling, SOB or lightheadedness with hypotension: unknown Has patient had a PCN reaction causing severe rash involving mucus membranes or skin necrosis: unknown Has patient had a PCN reaction that required hospitalization: unknown Has patient had a  PCN reaction occurring within the last 10 years: no If all of the above answers are "NO", then may proceed with Cephalosporin use.   Marland Kitchen Antihistamines, Loratadine-Type Other (See Comments)    depression  . Other     Beta blockers-Novacaine  Caused depression   Review of Systems  Unable to perform ROS: Acuity of condition    Physical Exam Constitutional:      Appearance: He is normal weight.     Interventions: Face mask in place.  Cardiovascular:     Rate and Rhythm: Tachycardia present.  Skin:    General: Skin is warm and dry.  Neurological:     Mental Status: He is unresponsive.     Vital Signs: BP 126/73 (BP Location: Left Arm)   Pulse (!) 107   Temp 99.7 F (37.6 C) (Rectal)   Resp (!) 27   Ht 6' (1.829 m) Comment: recorded 07/17/2019  Wt 90.4 kg   SpO2 100%   BMI 27.03 kg/m  Pain Scale: Faces   Pain Score: 0-No pain   SpO2: SpO2: 100 % O2 Device:SpO2: 100 % O2 Flow Rate: .  O2 Flow Rate (L/min): 2 L/min  IO: Intake/output summary:   Intake/Output Summary (Last 24 hours) at 08/08/2019 1004 Last data filed at 08/08/2019 0532 Gross per 24 hour  Intake 3738.98 ml  Output 2050 ml  Net 1688.98 ml    LBM: Last BM Date: 08/06/19 Baseline Weight: Weight: 95.3 kg Most recent weight: Weight: 90.4 kg     Palliative Assessment/Data: 10 %   Discussed with Bedside RN  Time In: 0930 Time Out: 1005 Time Total: 35 minutes Greater than 50%  of this time was spent counseling and coordinating care related to the above assessment and plan.  Signed by: Wadie Lessen, NP   Please contact Palliative Medicine Team phone at [phone] for questions and concerns.  For individual provider: See Shea Evans

## 2019-08-08 NOTE — Progress Notes (Signed)
Daughter updated on patient.

## 2019-08-08 NOTE — Progress Notes (Signed)
Pharmacy Antibiotic Note  Joel [name] is a 86 y.o. male on day # 4 Vancomycin, Meropenem and Acyclovir for meningitis coverage.   Currently on Vanc 1250 mg IV q24h, Meropenem 2gm IV q12h and Acyclovir 10 mg/kg/ IBW (775 mg) q12h.  Tmax 101.2, using prn Tylenol.  Renal function has improved with hydration.  Plan: Change Vancomycin to 750 mg IV every 12 hours.   Goal trough 15-20 mcg/ml. Vanc level at steady state. Adjust Meropenem to 2gm IV q8hrs. Adjust Acyclovir to 775 mg IV q8hrs. Follow renal function, culture data, clinical progress and antibiotic plans.  Height: 6' (182.9 cm) (recorded 07/17/2019) Weight: 90.4 kg (199 lb 4.7 oz) IBW/kg (Calculated) : 77.6  Temp (24hrs), Avg:100.5 F (38.1 C), Min:99.4 F (37.4 C), Max:101.2 F (38.4 C)  Recent Labs  Lab 08/03/2019 0715 08/22/2019 0939 08/06/19 0608 08/06/19 1558 08/07/19 0239 08/08/19 0345  WBC 10.8*  --  8.1  --  8.1 9.5  CREATININE 1.71*  --  1.60* 1.38* 1.33* 1.11  LATICACIDVEN 1.1 1.4  --   --   --   --     Estimated Creatinine Clearance: 54.4 mL/min (by C-G formula based on SCr of 1.11 mg/dL).    Allergies  Allergen Reactions  . Penicillins Anaphylaxis    Has patient had a PCN reaction causing immediate rash, facial/tongue/throat swelling, SOB or lightheadedness with hypotension: unknown Has patient had a PCN reaction causing severe rash involving mucus membranes or skin necrosis: unknown Has patient had a PCN reaction that required hospitalization: unknown Has patient had a PCN reaction occurring within the last 10 years: no If all of the above answers are "NO", then may proceed with Cephalosporin use.   Marland Kitchen Antihistamines, Loratadine-Type Other (See Comments)    depression  . Other     Beta blockers-Novacaine  Caused depression    Antimicrobials this admission: Ceftriaxone 7/4>>7/5 Meropenem 7/5>> Acyclovir 7/5>> Vancomycin 7/4>> Doxycycline po 7/4>>7/6 (but unable to take/none given)  Dose  adjustments this admission:  7/7: empiric adjustments for improved renal function  Microbiology results: 7/4 Blood x 2: no growth x 2 days to date 7/4 COVID: negative Thank you for allowing pharmacy to be a part of this patient's care.  Arty Baumgartner, [place] Phone: [phone] 08/08/2019 9:14 AM

## 2019-08-08 NOTE — Progress Notes (Addendum)
PROGRESS NOTE        PATIENT DETAILS Name: Joel [name] Age: 86 y.o. Sex: male Date of Birth: 03-Apr-1932 Admit Date: 08/11/2019 Admitting Physician Lequita Halt, MD ONG:EXBMWU, [place] Ar, MD  Brief Narrative: Patient is a 86 y.o. male with history of chronic atrial fibrillation, chronic diastolic heart failure, moderate aortic stenosis, HTN, COPD, chronic back/neck pain, chronic hypoxic respiratory failure on home O2-presented from SNF for severe metabolic encephalopathy-in the setting of severe hypoglycemia.  Patient also had a 2-3-day prodrome of fever, nausea and vomiting-and was told that he probably had pneumonia while at SNF.  He was subsequently admitted-provided supportive care-empirically started on broad-spectrum antimicrobial therapy to cover for intracranial infection-however in spite of supportive care-patient's mental status never improved.  After extensive discussion with family-he was transitioned to full comfort measures on 7/7.  Significant events: 7/4>> admit to [facility] for severe encephalopathy in the setting of hypoglycemia, BiPAP dependent hypoxic respiratory failure 7/5-7/7>> unable to be liberated off BiPAP-due to tachypnea and tachycardia-remains unresponsive to sternal rub/verbal stimuli 7/7>> after extensive discussion with family-transition to full comfort measures.  Significant studies: 7/4>> CT head: No acute intracranial abnormality 7/4>> chest x-ray: Congestive heart failure 7/4>> EEG: No clinical seizures/epileptiform discharges-diffuse background slowing which can be seen with toxic metabolic encephalopathy 1/3>> chest x-ray: Improved aeration bilaterally, slightly small right pleural effusion. 7/5>> MRI brain: 2 punctate foci of restricted diffusion involving the cortical gray matter of the anterior left frontal and posterior right parietal lobes-suspicious for tiny acute ischemic infarct.  Antimicrobial therapy: Vancomycin:  7/5>>7/7 Rocephin: 7/4>> 7/5 Acyclovir: 7/5>>7/7 Meropenem: 7/5>>7/7  Microbiology data: 7/4>> blood culture: Negative  Procedures : None  Consults: Neurology  DVT Prophylaxis : None as comfort care   Subjective: Remains unresponsive to loud verbal stimuli-no response to pain-remains on BiPAP this morning.  Assessment/Plan: Acute metabolic encephalopathy: Remains obtunded-unresponsive to painful stimuli.  I suspect this is from anoxic injury from severe hypoglycemia that he had on presentation-less likely from meningitis/encephalitis.  No improvement in mental status with Narcan administration (on narcotics chronically) and correction of hypoxemia.  No improvement with supportive care and empiric antimicrobial therapy-after extensive discussion with family-transitioning to full comfort measures today.  Acute on chronic hypoxic respiratory failure: Initially felt to have CHF-chest x-ray showed pulmonary edema-given Lasix with improvement in chest x-ray the next day-however unable to wean patient off BiPAP.  He remained BiPAP dependent for the past several days-suspect more for ventilation issues other than oxygenation issues.  After extensive discussion with family-we will plan to give him IV morphine push-then start a morphine infusion-and take him off the BiPAP and transition him to full comfort measures.  Acute CVA: Likely incidental finding on MRI-not felt to be contributing to his presentation.  Severe hypoglycemia: Presented with a blood sugar level of 31 on the day of admission-Per family-patient had episodes of hypoglycemia at SNF as well.  He was managed with supportive care-sugars were stabilized-however he had no improvement in his mental status.  He was off all insulin/oral diabetic medications during this hospital stay.  AKI: Likely hemodynamically mediated-improved with supportive care.  Acute on chronic diastolic heart failure: Hypoxic on presentation-given 1 dose of IV  Lasix with improvement-unfortunately no improvement in mental status.  He was managed with supportive care.  Chronic atrial fibrillation: Rate controlled-no anticoagulation as transitioned to comfort measures.  GERD: Stable-managed  with PPI.  COPD with chronic hypoxic respiratory failure on home O2  OSA on BiPAP nightly   Palliative care: DNR in place-unfortunate 86 year old with multiple medical comorbidities as outlined above-presented with severe metabolic encephalopathy-likely from anoxic injury from severe hypoglycemia.  Some consideration given to intracranial infection-however felt unlikely.  No improvement in spite of broad-spectrum antimicrobial therapy-other supportive care.  Has remained BiPAP dependent-and unresponsive for the past several days since admission.  This MD had a long discussion with family-they understand very poor prognosis-and the likelihood of significant brain injury from hypoglycemia.  They do not want to proceed with PEG tube/tracheostomy-and want to go ahead and transition patient to full comfort measures.  Starting morphine infusion-expect inpatient death.  Diet: Diet Order            Diet NPO time specified  Diet effective now                  Code Status: DNR  Family Communication: Daughter at bedside  Disposition Plan: Status is: Inpatient  Remains inpatient appropriate because:Inpatient level of care appropriate due to severity of illness   Dispo: The patient is from: SNF              Anticipated d/c is to: TBD              Anticipated d/c date is: 2 days              Patient currently is not medically stable to d/c.   Barriers to Discharge: Severe metabolic encephalopathy-severe hypoxia requiring constant BiPAP for the past several days-transitioning to comfort measures.  Suspect will pass while inpatient-however may need to consider residential hospice if he survives for several days.  Antimicrobial agents: Anti-infectives (From  admission, onward)   Start     Dose/Rate Route Frequency Ordered Stop   08/08/19 1600  acyclovir (ZOVIRAX) 775 mg in dextrose 5 % 150 mL IVPB  Status:  Discontinued        10 mg/kg  77.6 kg (Ideal) 165.5 mL/hr over 60 Minutes Intravenous Every 8 hours 08/08/19 0913 08/08/19 1409   08/08/19 1000  vancomycin (VANCOREADY) IVPB 750 mg/150 mL  Status:  Discontinued        750 mg 150 mL/hr over 60 Minutes Intravenous Every 12 hours 08/08/19 0913 08/08/19 1409   08/08/19 1000  meropenem (MERREM) 2 g in sodium chloride 0.9 % 100 mL IVPB  Status:  Discontinued        2 g 200 mL/hr over 30 Minutes Intravenous Every 8 hours 08/08/19 0913 08/08/19 1409   08/07/19 1000  vancomycin (VANCOREADY) IVPB 1250 mg/250 mL  Status:  Discontinued        1,250 mg 166.7 mL/hr over 90 Minutes Intravenous Every 24 hours 08/06/19 1500 08/08/19 0913   08/06/19 2345  meropenem (MERREM) 2 g in sodium chloride 0.9 % 100 mL IVPB  Status:  Discontinued        2 g 200 mL/hr over 30 Minutes Intravenous Every 12 hours 08/06/19 1502 08/08/19 0913   08/06/19 2200  vancomycin (VANCOREADY) IVPB 750 mg/150 mL  Status:  Discontinued        750 mg 150 mL/hr over 60 Minutes Intravenous Every 12 hours 08/06/19 0817 08/06/19 1500   08/06/19 1000  cefTRIAXone (ROCEPHIN) 1 g in sodium chloride 0.9 % 100 mL IVPB  Status:  Discontinued        1 g 200 mL/hr over 30 Minutes Intravenous Every 24 hours 08/27/2019 1104  08/06/19 0739   08/06/19 1000  acyclovir (ZOVIRAX) 775 mg in dextrose 5 % 150 mL IVPB  Status:  Discontinued        10 mg/kg  77.6 kg (Ideal) 165.5 mL/hr over 60 Minutes Intravenous Every 12 hours 08/06/19 0754 08/08/19 0913   08/06/19 0900  vancomycin (VANCOREADY) IVPB 2000 mg/400 mL        2,000 mg 200 mL/hr over 120 Minutes Intravenous  Once 08/06/19 0816 08/06/19 1232   08/06/19 0800  meropenem (MERREM) 1 g in sodium chloride 0.9 % 100 mL IVPB  Status:  Discontinued        1 g 200 mL/hr over 30 Minutes Intravenous Every 8  hours 08/06/19 0754 08/06/19 1502   08/06/19 0745  cefTRIAXone (ROCEPHIN) 2 g in sodium chloride 0.9 % 100 mL IVPB  Status:  Discontinued        2 g 200 mL/hr over 30 Minutes Intravenous Every 12 hours 08/06/19 0739 08/06/19 1448   08/04/2019 1115  metroNIDAZOLE (FLAGYL) IVPB 500 mg  Status:  Discontinued        500 mg 100 mL/hr over 60 Minutes Intravenous Every 8 hours 08/24/2019 1104 08/20/2019 1110   08/30/2019 1115  doxycycline (VIBRA-TABS) tablet 100 mg  Status:  Discontinued        100 mg Oral Every 12 hours 08/19/2019 1112 08/07/19 0751   08/22/2019 0915  cefTRIAXone (ROCEPHIN) 1 g in sodium chloride 0.9 % 100 mL IVPB        1 g 200 mL/hr over 30 Minutes Intravenous  Once 08/31/2019 0901 08/18/2019 1123   08/26/2019 0915  azithromycin (ZITHROMAX) 500 mg in sodium chloride 0.9 % 250 mL IVPB  Status:  Discontinued        500 mg 250 mL/hr over 60 Minutes Intravenous  Once 08/09/2019 0901 08/02/2019 1449       Time spent: 35 minutes-Greater than 50% of this time was spent in counseling, explanation of diagnosis, planning of further management, and coordination of care.  MEDICATIONS: Scheduled Meds: Continuous Infusions: . chlorproMAZINE (THORAZINE) IV    . morphine     PRN Meds:.albuterol, antiseptic oral rinse, atropine, chlorproMAZINE (THORAZINE) IV, diphenhydrAMINE, haloperidol **OR** haloperidol **OR** haloperidol lactate, LORazepam **OR** LORazepam **OR** LORazepam, LORazepam, magic mouthwash w/lidocaine, morphine, ondansetron **OR** ondansetron (ZOFRAN) IV, polyvinyl alcohol   PHYSICAL EXAM: Vital signs: Vitals:   08/08/19 0525 08/08/19 0730 08/08/19 0833 08/08/19 1200  BP:  126/73  134/79  Pulse: 99 (!) 108 (!) 107 (!) 109  Resp: (!) 27 (!) 33 (!) 27 (!) 26  Temp:  99.7 F (37.6 C)  (!) 100.5 F (38.1 C)  TempSrc:  Rectal  Rectal  SpO2: 99% 99% 100% 100%  Weight:      Height:       Filed Weights   08/31/2019 0758 08/07/19 0357  Weight: 95.3 kg 90.4 kg   Body mass index is 27.03  kg/m.   Gen Exam: Unresponsive to painful stimuli.  Pupils are almost pinpoint bilaterally but reactive. HEENT:atraumatic, normocephalic Chest: B/L clear to auscultation anteriorly CVS:S1S2 regular Abdomen:soft non tender, non distended Extremities:no edema Neurology: Does not withdraw any of his extremities to pain.  Plantars are upgoing.   Skin: no rash  I have personally reviewed following labs and imaging studies  LABORATORY DATA: CBC: Recent Labs  Lab 08/18/2019 0715 08/24/2019 0804 08/06/19 0608 08/07/19 0239 08/08/19 0345  WBC 10.8*  --  8.1 8.1 9.5  NEUTROABS 9.8*  --   --   --   --  HGB 8.7* 9.2* 9.0* 8.7* 8.8*  HCT 30.0* 27.0* 31.2* 30.3* 31.0*  MCV 99.7  --  98.4 97.4 97.8  PLT 166  --  170 153 638    Basic Metabolic Panel: Recent Labs  Lab 08/17/2019 0715 09/01/2019 0715 08/04/2019 0804 08/06/19 0608 08/06/19 1558 08/07/19 0239 08/08/19 0345  NA 141   < > 145 146* 145 141 139  K 3.4*   < > 3.6 2.7* 3.0* 3.2* 4.5  CL 100  --   --  100 100 99 101  CO2 30  --   --  30 32 31 30  GLUCOSE 88  --   --  153* 150* 221* 195*  BUN 41*  --   --  33* 49* 26* 27*  CREATININE 1.71*  --   --  1.60* 1.38* 1.33* 1.11  CALCIUM 8.6*  --   --  8.7* 8.6* 8.7* 8.6*  MG  --   --   --  2.4  --  2.5*  --   PHOS  --   --   --  3.2  --  1.9*  --    < > = values in this interval not displayed.    GFR: Estimated Creatinine Clearance: 54.4 mL/min (by C-G formula based on SCr of 1.11 mg/dL).  Liver Function Tests: Recent Labs  Lab 08/11/2019 0715  AST 50*  ALT 19  ALKPHOS 76  BILITOT 0.8  PROT 6.7  ALBUMIN 3.0*   No results for input(s): LIPASE, AMYLASE in the last 168 hours. Recent Labs  Lab 08/06/19 1408  AMMONIA 35    Coagulation Profile: No results for input(s): INR, PROTIME in the last 168 hours.  Cardiac Enzymes: No results for input(s): CKTOTAL, CKMB, CKMBINDEX, TROPONINI in the last 168 hours.  BNP (last 3 results) No results for input(s): PROBNP in the last  8760 hours.  Lipid Profile: No results for input(s): CHOL, HDL, LDLCALC, TRIG, CHOLHDL, LDLDIRECT in the last 72 hours.  Thyroid Function Tests: No results for input(s): TSH, T4TOTAL, FREET4, T3FREE, THYROIDAB in the last 72 hours.  Anemia Panel: Recent Labs    08/06/19 0608  TIBC 309  IRON 13*    Urine analysis:    Component Value Date/Time   COLORURINE YELLOW 08/22/2019 1003   APPEARANCEUR CLEAR 08/17/2019 1003   LABSPEC 1.011 08/23/2019 1003   PHURINE 5.0 08/19/2019 1003   GLUCOSEU NEGATIVE 08/15/2019 1003   HGBUR NEGATIVE 08/07/2019 1003   [place] 08/12/2019 1003   KETONESUR NEGATIVE 08/29/2019 1003   PROTEINUR NEGATIVE 08/09/2019 1003   UROBILINOGEN 0.2 10/22/2014 2136   NITRITE NEGATIVE 08/17/2019 1003   LEUKOCYTESUR NEGATIVE 08/02/2019 1003    Sepsis Labs: Lactic Acid, Venous    Component Value Date/Time   LATICACIDVEN 1.4 08/11/2019 0939    MICROBIOLOGY: Recent Results (from the past 240 hour(s))  SARS Coronavirus 2 by RT PCR (hospital order, performed in [facility] hospital lab) Nasopharyngeal Nasopharyngeal Swab     Status: None   Collection Time: 08/19/2019  7:15 AM   Specimen: Nasopharyngeal Swab  Result Value Ref Range Status   SARS Coronavirus 2 NEGATIVE NEGATIVE Final    Comment: (NOTE) SARS-CoV-2 target nucleic acids are NOT DETECTED.  The SARS-CoV-2 RNA is generally detectable in upper and lower respiratory specimens during the acute phase of infection. The lowest concentration of SARS-CoV-2 viral copies this assay can detect is 250 copies / mL. A negative result does not preclude SARS-CoV-2 infection and should not be used as  the sole basis for treatment or other patient management decisions.  A negative result may occur with improper specimen collection / handling, submission of specimen other than nasopharyngeal swab, presence of viral mutation(s) within the areas targeted by this assay, and inadequate number of viral  copies (<250 copies / mL). A negative result must be combined with clinical observations, patient history, and epidemiological information.  Fact Sheet for Patients:   StrictlyIdeas.no  Fact Sheet for Healthcare Providers: BankingDealers.co.za  This test is not yet approved or  cleared by the [place] FDA and has been authorized for detection and/or diagnosis of SARS-CoV-2 by FDA under an Emergency Use Authorization (EUA).  This EUA will remain in effect (meaning this test can be used) for the duration of the COVID-19 declaration under Section 564(b)(1) of the Act, 21 U.S.C. section 360bbb-3(b)(1), unless the authorization is terminated or revoked sooner.  Performed at [facility], [place] [address], [place], [place] [address]   Culture, blood (routine x 2)     Status: None (Preliminary result)   Collection Time: 08/26/2019  7:20 AM   Specimen: BLOOD RIGHT ARM  Result Value Ref Range Status   Specimen Description BLOOD RIGHT ARM  Final   Special Requests   Final    BOTTLES DRAWN AEROBIC AND ANAEROBIC Blood Culture adequate volume   Culture   Final    NO GROWTH 3 DAYS Performed at [facility], [address], [place], [place] [address]    Report Status PENDING  Incomplete  Culture, blood (routine x 2)     Status: None (Preliminary result)   Collection Time: 08/04/2019  7:30 AM   Specimen: BLOOD RIGHT HAND  Result Value Ref Range Status   Specimen Description BLOOD RIGHT HAND  Final   Special Requests   Final    BOTTLES DRAWN AEROBIC AND ANAEROBIC Blood Culture adequate volume   Culture   Final    NO GROWTH 3 DAYS Performed at [facility], [place] [address], [place],  [address]    Report Status PENDING  Incomplete    RADIOLOGY STUDIES/RESULTS: No results found.   LOS: 3 days   Oren Binet, MD  [facility]    To contact the attending provider between 7A-7P or the covering provider  during after hours 7P-7A, please log into the web site [email or web address] and access using universal [place] password for that web site. If you do not have the password, please call the hospital operator.  08/08/2019, 2:12 PM

## 2019-08-08 NOTE — Progress Notes (Signed)
Patient seen and examined-Long discussion with daughter and grandson at bedside.   Patient with severe anoxic injury from hypoglycemia-with no improvement with supportive care over the past several days.  Requiring persistent BiPAP support-16/6 with 30% FiO2.  Spoke with neurologist-Dr. Kirkpatrick-we both agree that it is appropriate to transition to comfort measures.  After discussion with daughter/grandson at bedside-family agreeable to proceed with full comfort measures-they are awaiting arrival of the patient's son-after which we will start comfort measures and liberate patient off the BiPAP.  Case discussed with nursing staff as well.  They will notify me when family is ready to transition to comfort measures.

## 2019-08-08 NOTE — Progress Notes (Signed)
   08/08/19 1107  Clinical Encounter Type  Visited With Patient and family together  Visit Type Initial;Spiritual support  Referral From Palliative care team  Consult/Referral To Chaplain  Spiritual Encounters  Spiritual Needs Prayer  This chaplain responded to PMT consult for EOL spiritual care.  The chaplain joined the Pt. and family bedside.  The Pt. daughter shared a brief life review of her father which included love of his family, community, and God. The family accepted the chaplain's invitation for prayer and continued spiritual care.  The chaplain updated the Pt. RN-Liana before leaving the unit.

## 2019-08-09 DIAGNOSIS — R0609 Other forms of dyspnea: Secondary | ICD-10-CM

## 2019-08-09 NOTE — Progress Notes (Signed)
Patient ID: MATTY VANROEKEL, male   DOB: [birth year], 86 y.o.   MRN: [id number]  This NP visited patient at the bedside as a follow up for palliative medicine needs and emotional support.  Patient is unresponsive to gentle touch and verbal stimuli.  He remains on nasal cannula and is tachycpneic, he is on continuous morphine gtt.    No family at bedside  Focus of care is comfort as discussed with Dr Sloan Leiter.  Discussed with nursing,   wean oxygen to no more than 2 liters/[place] and utilize medications for comfort.  Do not escalate oxygen delivery.  Education offered regarding utilization of medications to enhance comfort.  Prognosis is likely hours to days   Total time spent on the unit was 15 minutes  Greater than 50% of the time was spent in counseling and coordination of care  Wadie Lessen NP  Palliative Medicine Team Team Phone # [phone] Pager [phone]

## 2019-08-09 NOTE — Progress Notes (Signed)
   08/09/19 1003  Clinical Encounter Type  Visited With Patient and family together  Visit Type Follow-up;Psychological support;Spiritual support  Referral From Palliative care team  Consult/Referral To Chaplain  Spiritual Encounters  Spiritual Needs Emotional;Grief support  Stress Factors  Family Stress Factors Family relationships  This chaplain was pastorally present for F/U spiritual care.  The Pt. daughter-Teresa is present bedside.  [name] shares the Pt. appears to be resting comfortably.  The chaplain asked [name], how are you?  The chaplain listened to [name] describe her role as the Pt. daughter and life with the Pt. The chaplain heard [name] ask herself how will life be without my dad. The chaplain learned [name] has her faith and friends to partner with her on the new days.  The chaplain shared the possibility of grief counseling to identify, experience, and heal from the emotions she is feeling.  [name] accepted the invitation for F/U spiritual care as needed.

## 2019-08-09 NOTE — Progress Notes (Signed)
Pt's morphine titrated to 8mg /hr for comfort and high respiratory effort.

## 2019-08-09 NOTE — Progress Notes (Signed)
PROGRESS NOTE        PATIENT DETAILS Name: Joel [name] Age: 86 y.o. Sex: male Date of Birth: 05/12/1932 Admit Date: 08/09/2019 Admitting Physician Lequita Halt, MD [id number], D[place] Ar, MD  Brief Narrative: Patient is a 86 y.o. male with history of chronic atrial fibrillation, chronic diastolic heart failure, moderate aortic stenosis, HTN, COPD, chronic back/neck pain, chronic hypoxic respiratory failure on home O2-presented from SNF for severe metabolic encephalopathy-in the setting of severe hypoglycemia.  Patient also had a 2-3-day prodrome of fever, nausea and vomiting-and was told that he probably had pneumonia while at SNF.  He was subsequently admitted-provided supportive care-empirically started on broad-spectrum antimicrobial therapy to cover for intracranial infection-however in spite of supportive care-patient's mental status never improved.  After extensive discussion with family-he was transitioned to full comfort measures on 7/7.  Significant events: 7/4>> admit to [facility] for severe encephalopathy in the setting of hypoglycemia, BiPAP dependent hypoxic respiratory failure 7/5-7/7>> unable to be liberated off BiPAP-due to tachypnea and tachycardia-remains unresponsive to sternal rub/verbal stimuli 7/7>> after extensive discussion with family-transitioned to full comfort measures.  Significant studies: 7/4>> CT head: No acute intracranial abnormality 7/4>> chest x-ray: Congestive heart failure 7/4>> EEG: No clinical seizures/epileptiform discharges-diffuse background slowing which can be seen with toxic metabolic encephalopathy 8/9>> chest x-ray: Improved aeration bilaterally, slightly small right pleural effusion. 7/5>> MRI brain: 2 punctate foci of restricted diffusion involving the cortical gray matter of the anterior left frontal and posterior right parietal lobes-suspicious for tiny acute ischemic infarct.  Antimicrobial therapy: Vancomycin:  7/5>>7/7 Rocephin: 7/4>> 7/5 Acyclovir: 7/5>>7/7 Meropenem: 7/5>>7/7  Microbiology data: 7/4>> blood culture: Negative  Procedures : None  Consults: Neurology  DVT Prophylaxis : None as comfort care   Subjective: Unresponsive-on morphine infusion-6 mg/h.  Already having some apneic spells this morning.  Assessment/Plan: Acute metabolic encephalopathy: Suspected etiology secondary to anoxic injury from severe hypoglycemia, felt less likely to have intracranial infection.  No improvement in spite of supportive care-correction of hypoglycemia-after extensive discussion with family-transitioned to full comfort measures.  Remains obtunded-unresponsive to painful stimuli.  I suspect this is from anoxic injury from severe   Acute on chronic hypoxic respiratory failure: Initially felt to have CHF-chest x-ray showed pulmonary edema-given Lasix with improvement in chest x-ray the next day-however unable to wean patient off BiPAP.  He remained BiPAP dependent for the past several days-suspect more for ventilation issues other than oxygenation issues.  After extensive discussion with family-transitioned to full comfort measures.    Acute CVA: Likely incidental finding on MRI-not felt to be contributing to his presentation.  Severe hypoglycemia: Presented with a blood sugar level of 31 on the day of admission-Per family-patient had episodes of hypoglycemia at SNF as well.  He was managed with supportive care-sugars were stabilized-however he had no improvement in his mental status.  He was off all insulin/oral diabetic medications during this hospital stay.  AKI: Likely hemodynamically mediated-improved with supportive care.  Acute on chronic diastolic heart failure: Hypoxic on presentation-given 1 dose of IV Lasix with improvement-unfortunately no improvement in mental status.  He was managed with supportive care.  Chronic atrial fibrillation: Rate controlled-no anticoagulation as transitioned  to comfort measures.  GERD: Stable-managed with PPI.  COPD with chronic hypoxic respiratory failure on home O2  OSA on BiPAP nightly   Palliative care: DNR in place-unfortunate 86 year old with multiple  medical comorbidities-presented with severe metabolic encephalopathy-likely from anoxic injury from severe hypoglycemia.  Some consideration given to intracranial infection-however felt unlikely.  No improvement in spite of broad-spectrum antimicrobial therapy-other supportive care.  Has remained BiPAP dependent-and unresponsive for the past several days since admission.  This MD had a long discussion with family-they understand very poor prognosis-and the likelihood of significant brain injury from hypoglycemia.  They do not want to proceed with PEG tube/tracheostomy-and subsequently transitioned to full comfort measures.  Remains on morphine infusion-having some apneic spells-respiratory rate decreasing.  Suspect inpatient death in the next day or so-if not-we can contemplate transfer to residential hospice.  Long discussion with daughter at bedside this morning.   Diet: Diet Order            Diet NPO time specified  Diet effective now                  Code Status: DNR  Family Communication: Daughter at bedside  Disposition Plan: Status is: Inpatient  Remains inpatient appropriate because:Inpatient level of care appropriate due to severity of illness   Dispo: The patient is from: SNF              Anticipated d/c is to: TBD              Anticipated d/c date is: 2 days              Patient currently is not medically stable to d/c.   Barriers to Discharge: Severe metabolic encephalopathy-severe hypoxia requiring constant BiPAP for the past several days-transitioning to comfort measures.  Suspect will pass while inpatient-however may need to consider residential hospice if he survives for several days.  Antimicrobial agents: Anti-infectives (From admission, onward)   Start      Dose/Rate Route Frequency Ordered Stop   08/08/19 1600  acyclovir (ZOVIRAX) 775 mg in dextrose 5 % 150 mL IVPB  Status:  Discontinued        10 mg/kg  77.6 kg (Ideal) 165.5 mL/hr over 60 Minutes Intravenous Every 8 hours 08/08/19 0913 08/08/19 1409   08/08/19 1000  vancomycin (VANCOREADY) IVPB 750 mg/150 mL  Status:  Discontinued        750 mg 150 mL/hr over 60 Minutes Intravenous Every 12 hours 08/08/19 0913 08/08/19 1409   08/08/19 1000  meropenem (MERREM) 2 g in sodium chloride 0.9 % 100 mL IVPB  Status:  Discontinued        2 g 200 mL/hr over 30 Minutes Intravenous Every 8 hours 08/08/19 0913 08/08/19 1409   08/07/19 1000  vancomycin (VANCOREADY) IVPB 1250 mg/250 mL  Status:  Discontinued        1,250 mg 166.7 mL/hr over 90 Minutes Intravenous Every 24 hours 08/06/19 1500 08/08/19 0913   08/06/19 2345  meropenem (MERREM) 2 g in sodium chloride 0.9 % 100 mL IVPB  Status:  Discontinued        2 g 200 mL/hr over 30 Minutes Intravenous Every 12 hours 08/06/19 1502 08/08/19 0913   08/06/19 2200  vancomycin (VANCOREADY) IVPB 750 mg/150 mL  Status:  Discontinued        750 mg 150 mL/hr over 60 Minutes Intravenous Every 12 hours 08/06/19 0817 08/06/19 1500   08/06/19 1000  cefTRIAXone (ROCEPHIN) 1 g in sodium chloride 0.9 % 100 mL IVPB  Status:  Discontinued        1 g 200 mL/hr over 30 Minutes Intravenous Every 24 hours 08/11/2019 1104 08/06/19 0739  08/06/19 1000  acyclovir (ZOVIRAX) 775 mg in dextrose 5 % 150 mL IVPB  Status:  Discontinued        10 mg/kg  77.6 kg (Ideal) 165.5 mL/hr over 60 Minutes Intravenous Every 12 hours 08/06/19 0754 08/08/19 0913   08/06/19 0900  vancomycin (VANCOREADY) IVPB 2000 mg/400 mL        2,000 mg 200 mL/hr over 120 Minutes Intravenous  Once 08/06/19 0816 08/06/19 1232   08/06/19 0800  meropenem (MERREM) 1 g in sodium chloride 0.9 % 100 mL IVPB  Status:  Discontinued        1 g 200 mL/hr over 30 Minutes Intravenous Every 8 hours 08/06/19 0754 08/06/19  1502   08/06/19 0745  cefTRIAXone (ROCEPHIN) 2 g in sodium chloride 0.9 % 100 mL IVPB  Status:  Discontinued        2 g 200 mL/hr over 30 Minutes Intravenous Every 12 hours 08/06/19 0739 08/06/19 1448   08/13/2019 1115  metroNIDAZOLE (FLAGYL) IVPB 500 mg  Status:  Discontinued        500 mg 100 mL/hr over 60 Minutes Intravenous Every 8 hours 08/26/2019 1104 08/27/2019 1110   08/23/2019 1115  doxycycline (VIBRA-TABS) tablet 100 mg  Status:  Discontinued        100 mg Oral Every 12 hours 08/12/2019 1112 08/07/19 0751   08/04/2019 0915  cefTRIAXone (ROCEPHIN) 1 g in sodium chloride 0.9 % 100 mL IVPB        1 g 200 mL/hr over 30 Minutes Intravenous  Once 08/09/2019 0901 08/11/2019 1123   08/25/2019 0915  azithromycin (ZITHROMAX) 500 mg in sodium chloride 0.9 % 250 mL IVPB  Status:  Discontinued        500 mg 250 mL/hr over 60 Minutes Intravenous  Once 08/04/2019 0901 08/25/2019 1449       Time spent: 15 minutes-Greater than 50% of this time was spent in counseling, explanation of diagnosis, planning of further management, and coordination of care.  MEDICATIONS: Scheduled Meds: Continuous Infusions: . chlorproMAZINE (THORAZINE) IV    . morphine 6 mg/hr (08/09/19 0212)   PRN Meds:.albuterol, antiseptic oral rinse, atropine, chlorproMAZINE (THORAZINE) IV, diphenhydrAMINE, haloperidol **OR** haloperidol **OR** haloperidol lactate, LORazepam **OR** LORazepam **OR** LORazepam, LORazepam, magic mouthwash w/lidocaine, morphine, ondansetron **OR** ondansetron (ZOFRAN) IV, polyvinyl alcohol   PHYSICAL EXAM: Vital signs: Vitals:   08/08/19 0525 08/08/19 0730 08/08/19 0833 08/08/19 1200  BP:  126/73  134/79  Pulse: 99 (!) 108 (!) 107 (!) 109  Resp: (!) 27 (!) 33 (!) 27 (!) 26  Temp:  99.7 F (37.6 C)  (!) 100.5 F (38.1 C)  TempSrc:  Rectal  Rectal  SpO2: 99% 99% 100% 100%  Weight:      Height:       Filed Weights   08/12/2019 0758 08/07/19 0357  Weight: 95.3 kg 90.4 kg   Body mass index is 27.03 kg/m.    Gen Exam: Unresponsive to painful stimuli.  HEENT:atraumatic, normocephalic Chest: B/L clear to auscultation anteriorly CVS:S1S2 regular Abdomen:soft non tender, non distended Extremities:no edema    I have personally reviewed following labs and imaging studies  LABORATORY DATA: CBC: Recent Labs  Lab 08/28/2019 0715 08/04/2019 0804 08/06/19 0608 08/07/19 0239 08/08/19 0345  WBC 10.8*  --  8.1 8.1 9.5  NEUTROABS 9.8*  --   --   --   --   HGB 8.7* 9.2* 9.0* 8.7* 8.8*  HCT 30.0* 27.0* 31.2* 30.3* 31.0*  MCV 99.7  --  98.4 97.4  97.8  PLT 166  --  170 153 938    Basic Metabolic Panel: Recent Labs  Lab 08/14/2019 0715 09/01/2019 0715 08/02/2019 0804 08/06/19 0608 08/06/19 1558 08/07/19 0239 08/08/19 0345  NA 141   < > 145 146* 145 141 139  K 3.4*   < > 3.6 2.7* 3.0* 3.2* 4.5  CL 100  --   --  100 100 99 101  CO2 30  --   --  30 32 31 30  GLUCOSE 88  --   --  153* 150* 221* 195*  BUN 41*  --   --  33* 49* 26* 27*  CREATININE 1.71*  --   --  1.60* 1.38* 1.33* 1.11  CALCIUM 8.6*  --   --  8.7* 8.6* 8.7* 8.6*  MG  --   --   --  2.4  --  2.5*  --   PHOS  --   --   --  3.2  --  1.9*  --    < > = values in this interval not displayed.    GFR: Estimated Creatinine Clearance: 54.4 mL/min (by C-G formula based on SCr of 1.11 mg/dL).  Liver Function Tests: Recent Labs  Lab 08/18/2019 0715  AST 50*  ALT 19  ALKPHOS 76  BILITOT 0.8  PROT 6.7  ALBUMIN 3.0*   No results for input(s): LIPASE, AMYLASE in the last 168 hours. Recent Labs  Lab 08/06/19 1408  AMMONIA 35    Coagulation Profile: No results for input(s): INR, PROTIME in the last 168 hours.  Cardiac Enzymes: No results for input(s): CKTOTAL, CKMB, CKMBINDEX, TROPONINI in the last 168 hours.  BNP (last 3 results) No results for input(s): PROBNP in the last 8760 hours.  Lipid Profile: No results for input(s): CHOL, HDL, LDLCALC, TRIG, CHOLHDL, LDLDIRECT in the last 72 hours.  Thyroid Function Tests: No  results for input(s): TSH, T4TOTAL, FREET4, T3FREE, THYROIDAB in the last 72 hours.  Anemia Panel: No results for input(s): VITAMINB12, FOLATE, FERRITIN, TIBC, IRON, RETICCTPCT in the last 72 hours.  Urine analysis:    Component Value Date/Time   COLORURINE YELLOW 08/19/2019 1003   APPEARANCEUR CLEAR 08/11/2019 1003   LABSPEC 1.011 08/16/2019 1003   PHURINE 5.0 08/17/2019 1003   GLUCOSEU NEGATIVE 08/29/2019 1003   HGBUR NEGATIVE 08/24/2019 1003   BILIRUBINUR NEGATIVE 08/07/2019 1003   KETONESUR NEGATIVE 08/28/2019 1003   PROTEINUR NEGATIVE 08/22/2019 1003   UROBILINOGEN 0.2 10/22/2014 2136   NITRITE NEGATIVE 08/27/2019 1003   LEUKOCYTESUR NEGATIVE 08/02/2019 1003    Sepsis Labs: Lactic Acid, Venous    Component Value Date/Time   LATICACIDVEN 1.4 08/12/2019 0939    MICROBIOLOGY: Recent Results (from the past 240 hour(s))  SARS Coronavirus 2 by RT PCR (hospital order, performed in [facility] hospital lab) Nasopharyngeal Nasopharyngeal Swab     Status: None   Collection Time: 08/15/2019  7:15 AM   Specimen: Nasopharyngeal Swab  Result Value Ref Range Status   SARS Coronavirus 2 NEGATIVE NEGATIVE Final    Comment: (NOTE) SARS-CoV-2 target nucleic acids are NOT DETECTED.  The SARS-CoV-2 RNA is generally detectable in upper and lower respiratory specimens during the acute phase of infection. The lowest concentration of SARS-CoV-2 viral copies this assay can detect is 250 copies / mL. A negative result does not preclude SARS-CoV-2 infection and should not be used as the sole basis for treatment or other patient management decisions.  A negative result may occur with improper specimen collection /  handling, submission of specimen other than nasopharyngeal swab, presence of viral mutation(s) within the areas targeted by this assay, and inadequate number of viral copies (<250 copies / mL). A negative result must be combined with clinical observations, patient history, and  epidemiological information.  Fact Sheet for Patients:   StrictlyIdeas.no  Fact Sheet for Healthcare Providers: BankingDealers.co.za  This test is not yet approved or  cleared by the [place] FDA and has been authorized for detection and/or diagnosis of SARS-CoV-2 by FDA under an Emergency Use Authorization (EUA).  This EUA will remain in effect (meaning this test can be used) for the duration of the COVID-19 declaration under Section 564(b)(1) of the Act, 21 U.S.C. section 360bbb-3(b)(1), unless the authorization is terminated or revoked sooner.  Performed at [facility], [place] [address], [place], [place] [address]   Culture, blood (routine x 2)     Status: None (Preliminary result)   Collection Time: 08/26/2019  7:20 AM   Specimen: BLOOD RIGHT ARM  Result Value Ref Range Status   Specimen Description BLOOD RIGHT ARM  Final   Special Requests   Final    BOTTLES DRAWN AEROBIC AND ANAEROBIC Blood Culture adequate volume   Culture   Final    NO GROWTH 4 DAYS Performed at [facility], [place] [address] [place], [place] [address]    Report Status PENDING  Incomplete  Culture, blood (routine x 2)     Status: None (Preliminary result)   Collection Time: 08/19/2019  7:30 AM   Specimen: BLOOD RIGHT HAND  Result Value Ref Range Status   Specimen Description BLOOD RIGHT HAND  Final   Special Requests   Final    BOTTLES DRAWN AEROBIC AND ANAEROBIC Blood Culture adequate volume   Culture   Final    NO GROWTH 4 DAYS Performed at [facility], [place] [address], [place], [place] [address]    Report Status PENDING  Incomplete    RADIOLOGY STUDIES/RESULTS: No results found.   LOS: 4 days   Oren Binet, MD  [facility]    To contact the attending provider between 7A-7P or the covering provider during after hours 7P-7A, please log into the web site [email or web address] and access using universal [place] password  for that web site. If you do not have the password, please call the hospital operator.  08/09/2019, 12:26 PM

## 2019-08-09 NOTE — Consult Note (Addendum)
   [facility] CM Inpatient Consult   08/09/2019  Joel [name] November 09, 1932 [id number]   [facility] [ACO] Patient:   Patient screened for extreme high risk score for unplanned readmission score and for hospitalizations to check if potential [facility] service needs.  Review of patient's medical record reveals patient is recommended for comfort care measures per palliative consult notes.  Will sign off as no [facility] CM services needed at this review.  For questions contact:   Natividad Brood, RN BSN [facility] Liaison  [phone] business mobile phone Toll free office [phone]  Fax number: [phone] [email or web address]

## 2019-08-10 LAB — CULTURE, BLOOD (ROUTINE X 2)
Culture: NO GROWTH
Culture: NO GROWTH
Special Requests: ADEQUATE
Special Requests: ADEQUATE

## 2019-09-02 NOTE — Progress Notes (Signed)
Patient passed at 0226. Will Bonnet, RN was at bedside to verify patient's time of death. Dr. Cyd Silence was notified about patient's death & death certificate was signed. Daughter was at bedside to witness, along with taking his belongings.   Daughter wants to use [employer] [facility] [employer], but has not contacted them. This RN gave her further instructions on how to contact funeral home & patient placement.   IVs have been removed, urinary has been removed. Patient was cleaned & transferred to the morgue along with death certificate.  [place] [facility] reference number is [id number].  Morphine 100mg  in NS in 100 mL (1mg /mL) infusion - premix : wasted 25 mL with the witness of Will Bonnet, Therapist, sports.

## 2019-09-02 NOTE — Death Summary Note (Signed)
DEATH SUMMARY   Patient Details  Name: Joel [name] MRN: [id number] DOB: 01-Jul-1932  Admission/Discharge Information   Admit Date:  09/04/19  Date of Death: Date of Death: 2019/09/09  Time of Death: Time of Death: 0226  Length of Stay: 5  Referring Physician: Wenda Low, MD   Reason(s) for Hospitalization  Acute metabolic encephalopathy Acute hypoxic respiratory failure Severe hypoglycemia Acute on chronic diastolic heart failure Acute kidney injury  Diagnoses  Preliminary cause of death:  Secondary Diagnoses  Active Problems:   DM2 (diabetes mellitus, type 2) ([place])   Anemia, iron deficiency   COPD (chronic obstructive pulmonary disease) with acute bronchitis ([place])   Palliative care by specialist   DNR (do not resuscitate)   [place] Hospital Course (including significant findings, care, treatment, and services provided and events leading to death)  Brief Narrative: Patient is a 86 y.o. male with history of chronic atrial fibrillation, chronic diastolic heart failure, moderate aortic stenosis, HTN, COPD, chronic back/neck pain, chronic hypoxic respiratory failure on home O2-presented from SNF for severe metabolic encephalopathy-in the setting of severe hypoglycemia.  Patient also had a 2-3-day prodrome of fever, nausea and vomiting-and was told that he probably had pneumonia while at SNF.  He was subsequently admitted-provided supportive care-empirically started on broad-spectrum antimicrobial therapy to cover for intracranial infection-however in spite of supportive care-patient's mental status never improved.  After extensive discussion with family-he was transitioned to full comfort measures on 7/7.  Significant events: 7/4>> admit to [facility] for severe encephalopathy in the setting of hypoglycemia, BiPAP dependent hypoxic respiratory failure 7/5-7/7>> unable to be liberated off BiPAP-due to tachypnea and tachycardia-remains unresponsive to sternal rub/verbal stimuli 7/7>>  after extensive discussion with family-transitioned to full comfort measures.  Significant studies: 7/4>> CT head: No acute intracranial abnormality 7/4>> chest x-ray: Congestive heart failure 7/4>> EEG: No clinical seizures/epileptiform discharges-diffuse background slowing which can be seen with toxic metabolic encephalopathy 7/2>> chest x-ray: Improved aeration bilaterally, slightly small right pleural effusion. 7/5>> MRI brain: 2 punctate foci of restricted diffusion involving the cortical gray matter of the anterior left frontal and posterior right parietal lobes-suspicious for tiny acute ischemic infarct.  Antimicrobial therapy: Vancomycin: 7/5>>7/7 Rocephin: 7/4>> 7/5 Acyclovir: 7/5>>7/7 Meropenem: 7/5>>7/7  Microbiology data: 7/4>> blood culture: Negative  Procedures : None  Consults: Neurology Palliative care  Hospital course by problem list: Acute metabolic encephalopathy: Suspected etiology secondary to anoxic injury from severe hypoglycemia, felt less likely to have intracranial infection.  No improvement in spite of supportive care-correction of hypoglycemia-after extensive discussion with family-transitioned to full comfort measures.    Acute on chronic hypoxic respiratory failure: Initially felt to have CHF-chest x-ray showed pulmonary edema-given Lasix with improvement in chest x-ray the next day-however unable to wean patient off BiPAP. He remained BiPAP dependent for several days-suspect more for ventilation issues other than oxygenation issues.  After extensive discussion with family-transitioned to full comfort measures.    Acute CVA: Likely incidental finding on MRI-not felt to be contributing to his presentation.  Severe hypoglycemia: Presented with a blood sugar level of 31 on the day of admission-Per family-patient had episodes of hypoglycemia at SNF as well.  He was managed with supportive care-sugars were stabilized-however he had no improvement in  his mental status.  He was off all insulin/oral diabetic medications during this hospital stay.  AKI: Likely hemodynamically mediated-improved with supportive care.  Acute on chronic diastolic heart failure: Hypoxic on presentation-given 1 dose of IV Lasix with improvement-unfortunately no improvement in mental status.  He was managed with supportive care.  Chronic atrial fibrillation: Rate controlled-no anticoagulation as transitioned to comfort measures.  GERD: Stable-managed with PPI.  COPD with chronic hypoxic respiratory failure on home O2  OSA on BiPAP nightly   Palliative care: DNR in place-unfortunate 86 year old with multiple medical comorbidities-presented with severe metabolic encephalopathy-likely from anoxic injury from severe hypoglycemia.  Some consideration given to intracranial infection-however felt unlikely.  No improvement in spite of broad-spectrum antimicrobial therapy-other supportive care. Remained BiPAP dependent-and unresponsive for  several days since admission.  This MD had a long discussion with family-they understand very poor prognosis-and the likelihood of significant brain injury from hypoglycemia.  They do not want to proceed with PEG tube/tracheostomy-and subsequently transitioned to full comfort measures.  He was then transition to morphine infusion-and started on other supportive care-he subsequently expired on 7/9 at 0226 hours.   Pertinent Labs and Studies  Significant Diagnostic Studies DG Chest 1 View  Result Date: 08/06/2019 CLINICAL DATA:  Fever. EXAM: CHEST  1 VIEW COMPARISON:  Single-view of the chest 08/09/2019 01/23/2018. FINDINGS: Aeration is improved since the most recent examination. Mild, hazy opacity over the lower right chest is suggestive of a small effusion. Heart size is enlarged. Aortic atherosclerosis noted. No pneumothorax. IMPRESSION: Improved aeration bilaterally. Likely small right pleural effusion. Electronically Signed   By:  Inge Rise M.D.   On: 08/06/2019 12:26   DG Chest 1 View  Result Date: 08/16/2019 CLINICAL DATA:  Shortness of breath starting this morning EXAM: CHEST  1 VIEW COMPARISON:  January 23, 2018 FINDINGS: The mediastinal contour is normal. The heart size is mildly enlarged. Pulmonary increased interstitium with the enlarged central pulmonary vessel caliber are identified bilaterally. No focal pneumonia or pleural effusion is noted. Bony structures are stable. IMPRESSION: congestive heart failure. Electronically Signed   By: Abelardo Diesel M.D.   On: 08/03/2019 07:24   DG Shoulder Right  Result Date: 07/21/2019 CLINICAL DATA:  Pain EXAM: RIGHT SHOULDER - 2+ VIEW COMPARISON:  None. FINDINGS: There is no evidence of fracture or dislocation. Advanced right shoulder osteoarthritis is seen with joint space loss and marginal osteophyte formation. There is diffuse osteopenia. IMPRESSION: No acute osseous abnormality. Electronically Signed   By: Prudencio Pair M.D.   On: 07/21/2019 01:45   DG Abd 1 View  Result Date: 08/06/2019 CLINICAL DATA:  Evaluate for possible metallic foreign body EXAM: ABDOMEN - 1 VIEW COMPARISON:  None. FINDINGS: Scattered large and small bowel gas is noted. No abnormal mass or abnormal calcifications are noted. No free air is seen. Fixation screws are noted in the proximal right femur. Fiducial markers are noted in the prostate. Degenerative changes of the lumbar spine are noted. No other focal abnormality is noted. IMPRESSION: No radiopaque foreign body is identified to preclude MR imaging. Postsurgical changes are seen. Electronically Signed   By: Inez Catalina M.D.   On: 08/06/2019 01:48   CT Head Wo Contrast  Result Date: 08/13/2019 CLINICAL DATA:  Altered mental status, unclear cause. Additional history provided: Unresponsive, hypoglycemic. EXAM: CT HEAD WITHOUT CONTRAST TECHNIQUE: Contiguous axial images were obtained from the base of the skull through the vertex without intravenous  contrast. COMPARISON:  Prior head CT 09/08/2017, brain MRI 01/27/2016 FINDINGS: Brain: Stable, mild generalized parenchymal atrophy. Stable, mild ill-defined hypoattenuation within the cerebral white matter which is nonspecific, but consistent with chronic small vessel ischemic disease. Redemonstrated chronic lacunar infarcts within the right basal ganglia and left thalamus. There is no acute intracranial hemorrhage. No  acute demarcated cortical infarct is identified. No extra-axial fluid collection. No evidence of intracranial mass. No midline shift. Vascular: No hyperdense vessel.  Atherosclerotic calcifications. Skull: Normal. Negative for fracture or focal lesion. Sinuses/Orbits: Visualized orbits show no acute finding. Mild paranasal sinus mucosal thickening at the imaged levels, most notably ethmoid. No significant mastoid effusion. IMPRESSION: No CT evidence of acute intracranial abnormality. Stable, mild generalized parenchymal atrophy and chronic small vessel ischemic disease. Redemonstrated chronic lacunar infarcts within the right basal ganglia and left thalamus. Mild paranasal sinus mucosal thickening. Electronically Signed   By: Kellie Simmering DO   On: 08/30/2019 14:49   MR BRAIN WO CONTRAST  Result Date: 08/06/2019 CLINICAL DATA:  Initial evaluation for acute encephalopathy. EXAM: MRI HEAD WITHOUT CONTRAST TECHNIQUE: Multiplanar, multiecho pulse sequences of the brain and surrounding structures were obtained without intravenous contrast. COMPARISON:  Prior head CT from 08/11/2019. FINDINGS: Brain: Generalized age-related cerebral atrophy. Mild scattered patchy T2/FLAIR hyperintensity within the periventricular deep white matter both cerebral hemispheres most consistent with chronic small vessel ischemic disease, mild for age. Remote lacunar infarct noted at the right frontal corona radiata. There is a punctate focus of restricted diffusion involving the cortical gray matter of the anterior left  frontal lobe (series 5, image 88). Additional punctate focus of diffusion abnormality seen involving the cortical gray matter of the posterior right parietal lobe (series 5, image 82). Finding suspicious for tiny acute ischemic infarcts. No associated hemorrhage or mass effect. Few additional somewhat vague foci of diffusion abnormality involving the hemispheric cerebral white matter favored to reflect T2 shine through. No other evidence for acute ischemia. Gray-white matter differentiation maintained. No encephalomalacia to suggest chronic cortical infarction. No evidence for acute intracranial hemorrhage. Few punctate foci of susceptibility artifact noted, suggesting small chronic microhemorrhages, likely small vessel related. No mass lesion, midline shift or mass effect. No hydrocephalus or extra-axial fluid collection. Pituitary gland suprasellar region normal. Midline structures intact. Vascular: Major intracranial vascular flow voids are maintained. Skull and upper cervical spine: Craniocervical junction within normal limits. Bone marrow signal intensity normal. No scalp soft tissue abnormality. Sinuses/Orbits: Patient status post bilateral ocular lens replacement. Mild scattered mucosal thickening noted within the sphenoethmoidal sinuses. Mastoid air cells are clear. Middle ear cavities grossly within normal limits. Other: None. IMPRESSION: 1. Two punctate foci of restricted diffusion involving the cortical gray matter of the anterior left frontal and posterior right parietal lobes as above, suspicious for tiny acute ischemic infarcts. No associated hemorrhage or mass effect. 2. No other acute intracranial abnormality. 3. Age-related cerebral atrophy with underlying mild chronic small vessel ischemic disease. Electronically Signed   By: Jeannine Boga M.D.   On: 08/06/2019 03:43   DG Knee Complete 4 Views Right  Result Date: 07/21/2019 CLINICAL DATA:  Pain EXAM: RIGHT KNEE - COMPLETE 4+ VIEW  COMPARISON:  None. FINDINGS: No fracture or dislocation. There is tricompartmental osteoarthritis, most notable in the medial compartment and patellofemoral compartment. No large knee joint effusion. There is soft tissue swelling over the medial aspect of the knee. Scattered vascular calcifications are noted. IMPRESSION: No acute osseous abnormality. Electronically Signed   By: Prudencio Pair M.D.   On: 07/21/2019 01:51   EEG adult  Result Date: 08/24/2019 Philemon Kingdom, MD     08/22/2019 12:17 PM Date of Study: 08/25/2019 Reason for Study:  Pt is a 46 YOM with PMH of DM II, PROSTATE CA, OSA, HTN, A.FIB who presented with AMS. Eval for seizures. Description of recording: This recording  was obtained using a Digital EEG system with 18 channel capacity . Standard bipolar and referential EEG montages were used following the International 10 - 20 System . This is a digitally recorded EEG with duration of approximately ~ 23:28 minutes. There is diffuse background slowing consisting of delta rhythm without any well defined posterior dominant alpha rhythm. There were no epileptiform discharges or electrographic seizures noted. Hyperventilation was not performed. Photic stimulation not was performed. unable to interpret EKG. IMPRESSION: This is an abnormal EEG for age due to diffuse background slowing which can be seen with toxic/metabolic encephalopathy, cerebral ischemia, intracerebral hemorrhage, tumors, traumatic brain injury, cortical malformations, or other cerebral dysfunction. There were no clinical seizures or epileptiform discharges noted during study. Clinical correlation is advised. I have personally reviewed this study.   ECHOCARDIOGRAM COMPLETE  Result Date: 08/06/2019    ECHOCARDIOGRAM REPORT   Patient Name:   APOLO CUTSHAW Date of Exam: 08/06/2019 Medical Rec #:  [id number]      Height:       72.0 in Accession #:    [id number]     Weight:       210.1 lb Date of Birth:  1932/10/19      BSA:          2.176  m Patient Age:    86 years       BP:           131/68 mmHg Patient Gender: M              HR:           99 bpm. Exam Location:  Inpatient Procedure: 2D Echo, Color Doppler and Cardiac Doppler Indications:    Aortic Stenosis i35.0  History:        Patient has prior history of Echocardiogram examinations, most                 recent 09/01/2017. CHF, COPD; Risk Factors:Hypertension, Diabetes,                 Dyslipidemia and Sleep Apnea.  Sonographer:    Raquel Sarna Senior RDCS Referring Phys: [id number] Lequita Halt  Sonographer Comments: Very difficult windows, patient scanned supine on bi-pap. Majority of study performed from subcostal. IMPRESSIONS  1. Left ventricular ejection fraction, by estimation, is 50 to 55%. The left ventricle has low normal function. The left ventricle demonstrates regional wall motion abnormalities (see scoring diagram/findings for description). Septum appears hypokinesis  to dyskinetic in some views. Left ventricular diastolic parameters are indeterminate.  2. Right ventricular systolic function is mildly reduced. The right ventricular size is mildly enlarged. Tricuspid regurgitation signal is inadequate for assessing PA pressure.  3. Left atrial size was mildly dilated.  4. Right atrial size was mildly dilated.  5. The mitral valve is abnormal, mildly thickened and calcified. Trivial mitral valve regurgitation.  6. The aortic valve was not well visualized, mildly to moderately calcified. Aortic valve regurgitation is not visualized. Suspect moderate aortic valve stenosis based on the following parameters, but leaflet structure not well defined. Aortic valve area, by VTI measures 1.17 cm. Aortic valve mean gradient measures 19.0 mmHg. Aortic valve Vmax measures 2.87 m/s. Dimentionless index.  7. The inferior vena cava is dilated in size with <50% respiratory variability, suggesting right atrial pressure of 15 mmHg. FINDINGS  Left Ventricle: Left ventricular ejection fraction, by estimation, is 50  to 55%. The left ventricle has low normal function. The left ventricle demonstrates regional wall motion abnormalities.  The left ventricular internal cavity size was normal in size. There is borderline left ventricular hypertrophy. Left ventricular diastolic parameters are indeterminate.  LV Wall Scoring: The mid and distal anterior septum and mid inferoseptal segment are hypokinetic. Right Ventricle: The right ventricular size is mildly enlarged. No increase in right ventricular wall thickness. Right ventricular systolic function is mildly reduced. Tricuspid regurgitation signal is inadequate for assessing PA pressure. Left Atrium: Left atrial size was mildly dilated. Right Atrium: Right atrial size was mildly dilated. Pericardium: The pericardium was not assessed. Mitral Valve: The mitral valve is abnormal. There is mild thickening of the mitral valve leaflet(s). Mild to moderate mitral annular calcification. Trivial mitral valve regurgitation. Tricuspid Valve: The tricuspid valve is grossly normal. Tricuspid valve regurgitation is trivial. Aortic Valve: The aortic valve was not well visualized. Aortic valve regurgitation is not visualized. Moderate aortic stenosis is present. Aortic valve mean gradient measures 19.0 mmHg. Aortic valve peak gradient measures 32.9 mmHg. Aortic valve area, by  VTI measures 1.17 cm. Pulmonic Valve: The pulmonic valve was not well visualized. Pulmonic valve regurgitation is trivial. Aorta: The aortic root is normal in size and structure. Venous: The inferior vena cava is dilated in size with less than 50% respiratory variability, suggesting right atrial pressure of 15 mmHg. IAS/Shunts: No atrial level shunt detected by color flow Doppler.  LEFT VENTRICLE PLAX 2D LVIDd:         5.30 cm LVIDs:         4.00 cm LV PW:         1.00 cm LV IVS:        0.90 cm LVOT diam:     2.00 cm LV SV:         57 LV SV Index:   26 LVOT Area:     3.14 cm  RIGHT VENTRICLE RV S prime:     5.77 cm/s TAPSE  (M-mode): 0.7 cm LEFT ATRIUM              Index       RIGHT ATRIUM           Index LA diam:        5.20 cm  2.39 cm/m  RA Area:     27.10 cm LA Vol (A2C):   112.0 ml 51.48 ml/m RA Volume:   85.20 ml  39.16 ml/m LA Vol (A4C):   82.1 ml  37.74 ml/m LA Biplane Vol: 96.7 ml  44.45 ml/m  AORTIC VALVE AV Area (Vmax):    1.12 cm AV Area (Vmean):   1.15 cm AV Area (VTI):     1.17 cm AV Vmax:           286.67 cm/s AV Vmean:          200.000 cm/s AV VTI:            0.485 m AV Peak Grad:      32.9 mmHg AV Mean Grad:      19.0 mmHg LVOT Vmax:         102.00 cm/s LVOT Vmean:        73.100 cm/s LVOT VTI:          0.180 m LVOT/AV VTI ratio: 0.37  AORTA Ao Root diam: 3.30 cm  SHUNTS Systemic VTI:  0.18 m Systemic Diam: 2.00 cm Rozann Lesches MD Electronically signed by Rozann Lesches MD Signature Date/Time: 08/06/2019/3:41:03 PM    Final    DG Hip Unilat W or Wo Pelvis 2-3 Views  Left  Result Date: 07/21/2019 CLINICAL DATA:  Pain. Fall off toilet while using the bathroom. EXAM: DG HIP (WITH OR WITHOUT PELVIS) 2-3V LEFT COMPARISON:  None. FINDINGS: Right hip better assessed on concurrent right hip radiograph, performed concurrently. No evidence of pelvis or left hip fracture. Left femoral head is well seated in the acetabulum. Pubic rami are intact. Pubic symphysis and sacroiliac joints are congruent. Surgical clips in the region of the prostate. IMPRESSION: No fracture of the pelvis or left hip. Electronically Signed   By: Keith Rake M.D.   On: 07/21/2019 01:53   DG Hip Unilat W or Wo Pelvis 2-3 Views Right  Result Date: 07/21/2019 CLINICAL DATA:  Pain. Fall off toilet while using the bathroom. Right hip and knee pain. EXAM: DG HIP (WITH OR WITHOUT PELVIS) 2-3V RIGHT COMPARISON:  Pelvis and hip radiograph 01/23/2018 FINDINGS: Mild a screw fixation of remote proximal femur fracture. The screws extend into the soft tissues approximately 3.6 cm laterally, which appears slightly increased from prior exam. Remote  femoral neck fracture in unchanged alignment. Femoral head remains seated. No evidence of acute hip fracture. The pubic rami are intact. IMPRESSION: 1. No acute fracture of the pelvis or right hip. 2. Screw fixation of remote proximal femur fracture. The screws extend into the lateral soft tissues, which appears slightly increased from prior exam. Prior femoral neck fracture is unchanged in alignment. Electronically Signed   By: Keith Rake M.D.   On: 07/21/2019 01:52   VAS [place] LOWER EXTREMITY VENOUS REFLUX  Result Date: 07/17/2019  Lower Venous Reflux Study Indications: Swelling, Edema, and Pain.  Performing Technologist: Delorise Shiner RVT  Examination Guidelines: A complete evaluation includes B-mode imaging, spectral Doppler, color Doppler, and power Doppler as needed of all accessible portions of each vessel. Bilateral testing is considered an integral part of a complete examination. Limited examinations for reoccurring indications may be performed as noted. The reflux portion of the exam is performed with the patient in reverse Trendelenburg. Significant venous reflux is defined as >500 ms in the superficial venous system, and >1 second in the deep venous system.  Venous Reflux Times +---------------+---------+------+-----------+------------+--------+ RIGHT          Reflux NoRefluxReflux TimeDiameter cmsComments                          Yes                                  +---------------+---------+------+-----------+------------+--------+ CFV            no                                             +---------------+---------+------+-----------+------------+--------+ FV prox        no                                             +---------------+---------+------+-----------+------------+--------+ FV mid         no                                             +---------------+---------+------+-----------+------------+--------+  FV dist        no                                              +---------------+---------+------+-----------+------------+--------+ Popliteal      no                                             +---------------+---------+------+-----------+------------+--------+ GSV at [facility]     no                           0.751             +---------------+---------+------+-----------+------------+--------+ GSV prox thigh no                           0.472             +---------------+---------+------+-----------+------------+--------+ GSV mid thigh  no                           0.575             +---------------+---------+------+-----------+------------+--------+ GSV dist thigh no                           0.411             +---------------+---------+------+-----------+------------+--------+ GSV at knee    no                           0.365             +---------------+---------+------+-----------+------------+--------+ GSV prox calf                               0.269             +---------------+---------+------+-----------+------------+--------+ GSV mid calf                                0.217             +---------------+---------+------+-----------+------------+--------+ SSV Pop Fossa                               0.138             +---------------+---------+------+-----------+------------+--------+ SSV prox calf  no                           0.143             +---------------+---------+------+-----------+------------+--------+ SSV mid calf   no                           0.143             +---------------+---------+------+-----------+------------+--------+ AAGSV prx thighno                           0.315             +---------------+---------+------+-----------+------------+--------+  AAGSV mid thighno                           0.297             +---------------+---------+------+-----------+------------+--------+   +--------------+---------+------+-----------+------------+--------+ LEFT          Reflux NoRefluxReflux TimeDiameter cmsComments                         Yes                                  +--------------+---------+------+-----------+------------+--------+ CFV           no                                             +--------------+---------+------+-----------+------------+--------+ FV prox       no                                             +--------------+---------+------+-----------+------------+--------+ FV mid        no                                             +--------------+---------+------+-----------+------------+--------+ FV dist       no                                             +--------------+---------+------+-----------+------------+--------+ Popliteal     no                                             +--------------+---------+------+-----------+------------+--------+ GSV at SFJ    no                           0.713             +--------------+---------+------+-----------+------------+--------+ GSV prox thighno                           0.842             +--------------+---------+------+-----------+------------+--------+ GSV mid thigh no                           0.561             +--------------+---------+------+-----------+------------+--------+ GSV dist thighno                           0.429             +--------------+---------+------+-----------+------------+--------+ GSV at knee   no                           0.416             +--------------+---------+------+-----------+------------+--------+  GSV prox calf                              0.292             +--------------+---------+------+-----------+------------+--------+ GSV mid calf                               0.415             +--------------+---------+------+-----------+------------+--------+ SSV Pop Fossa                               0.283             +--------------+---------+------+-----------+------------+--------+ SSV prox calf no                           0.216             +--------------+---------+------+-----------+------------+--------+ SSV mid calf  no                           0.135             +--------------+---------+------+-----------+------------+--------+   Summary: Bilateral: - No evidence of deep vein thrombosis seen in the lower extremities, bilaterally, from the common femoral through the popliteal veins. - No evidence of deep venous insufficiency seen bilaterally in the lower extremity. - No evidence of superficial venous reflux seen in the greater saphenous veins bilaterally. - No evidence of superficial venous reflux seen in the short saphenous veins bilaterally.  Right: - Venous Doppler signals are pulsatile bilaterally.  Left: - Color duplex evaluation of the left lower extremity shows there is thrombus in the lesser saphenous vein. - The distal left small saphenous vein is non compressible consistent with chronic thrombus. Venous Doppler signals are pulsatile bilaterally.  *See table(s) above for measurements and observations. Electronically signed by Monica Martinez MD on 07/17/2019 at 5:04:08 PM.    Final     Microbiology Recent Results (from the past 240 hour(s))  SARS Coronavirus 2 by RT PCR (hospital order, performed in [facility] hospital lab) Nasopharyngeal Nasopharyngeal Swab     Status: None   Collection Time: 08/15/2019  7:15 AM   Specimen: Nasopharyngeal Swab  Result Value Ref Range Status   SARS Coronavirus 2 NEGATIVE NEGATIVE Final    Comment: (NOTE) SARS-CoV-2 target nucleic acids are NOT DETECTED.  The SARS-CoV-2 RNA is generally detectable in upper and lower respiratory specimens during the acute phase of infection. The lowest concentration of SARS-CoV-2 viral copies this assay can detect is 250 copies / mL. A negative result does not preclude SARS-CoV-2 infection and  should not be used as the sole basis for treatment or other patient management decisions.  A negative result may occur with improper specimen collection / handling, submission of specimen other than nasopharyngeal swab, presence of viral mutation(s) within the areas targeted by this assay, and inadequate number of viral copies (<250 copies / mL). A negative result must be combined with clinical observations, patient history, and epidemiological information.  Fact Sheet for Patients:   StrictlyIdeas.no  Fact Sheet for Healthcare Providers: BankingDealers.co.za  This test is not yet approved or  cleared by the [place] FDA and has been authorized for detection and/or diagnosis of SARS-CoV-2 by FDA under an  Emergency Use Authorization (EUA).  This EUA will remain in effect (meaning this test can be used) for the duration of the COVID-19 declaration under Section 564(b)(1) of the Act, 21 U.S.C. section 360bbb-3(b)(1), unless the authorization is terminated or revoked sooner.  Performed at [facility], [place] [address], [place], [place] [address]   Culture, blood (routine x 2)     Status: None   Collection Time: 08/31/2019  7:20 AM   Specimen: BLOOD RIGHT ARM  Result Value Ref Range Status   Specimen Description BLOOD RIGHT ARM  Final   Special Requests   Final    BOTTLES DRAWN AEROBIC AND ANAEROBIC Blood Culture adequate volume   Culture   Final    NO GROWTH 5 DAYS Performed at [facility], [address], [place], [place] [address]    Report Status August 31, 2019 FINAL  Final  Culture, blood (routine x 2)     Status: None   Collection Time: 08/04/2019  7:30 AM   Specimen: BLOOD RIGHT HAND  Result Value Ref Range Status   Specimen Description BLOOD RIGHT HAND  Final   Special Requests   Final    BOTTLES DRAWN AEROBIC AND ANAEROBIC Blood Culture adequate volume   Culture   Final    NO GROWTH 5 DAYS Performed at [facility], [place] [address], [place], [place] [address]    Report Status 08/31/19 FINAL  Final    Lab Basic Metabolic Panel: Recent Labs  Lab 08/07/2019 0715 08/02/2019 0715 08/20/2019 0804 08/06/19 5427 08/06/19 1558 08/07/19 0239 08/08/19 0345  NA 141   < > 145 146* 145 141 139  K 3.4*   < > 3.6 2.7* 3.0* 3.2* 4.5  CL 100  --   --  100 100 99 101  CO2 30  --   --  30 32 31 30  GLUCOSE 88  --   --  153* 150* 221* 195*  BUN 41*  --   --  33* 49* 26* 27*  CREATININE 1.71*  --   --  1.60* 1.38* 1.33* 1.11  CALCIUM 8.6*  --   --  8.7* 8.6* 8.7* 8.6*  MG  --   --   --  2.4  --  2.5*  --   PHOS  --   --   --  3.2  --  1.9*  --    < > = values in this interval not displayed.   Liver Function Tests: Recent Labs  Lab 08/25/2019 0715  AST 50*  ALT 19  ALKPHOS 76  BILITOT 0.8  PROT 6.7  ALBUMIN 3.0*   No results for input(s): LIPASE, AMYLASE in the last 168 hours. Recent Labs  Lab 08/06/19 1408  AMMONIA 35   CBC: Recent Labs  Lab 08/17/2019 0715 08/09/2019 0804 08/06/19 0608 08/07/19 0239 08/08/19 0345  WBC 10.8*  --  8.1 8.1 9.5  NEUTROABS 9.8*  --   --   --   --   HGB 8.7* 9.2* 9.0* 8.7* 8.8*  HCT 30.0* 27.0* 31.2* 30.3* 31.0*  MCV 99.7  --  98.4 97.4 97.8  PLT 166  --  170 153 172   Cardiac Enzymes: No results for input(s): CKTOTAL, CKMB, CKMBINDEX, TROPONINI in the last 168 hours. Sepsis Labs: Recent Labs  Lab 08/20/2019 0715 08/09/2019 0939 08/06/19 0608 08/07/19 0239 08/08/19 0345  PROCALCITON  --   --   --  1.36 1.07  WBC 10.8*  --  8.1 8.1 9.5  LATICACIDVEN 1.1 1.4  --   --   --     Procedures/Operations     [employer] 08/11/2019, 2:16 PM

## 2019-09-02 NOTE — Significant Event (Signed)
HOSPITAL MEDICINE EVENT NOTE  Notified by nursing patient has expired and has been pronounced by nursing staff.  I arrived on the floor to complete the death certificate.  Discharge summary to be completed by daytime provider.  Sherryll Burger Coda Filler

## 2019-09-02 DEATH — deceased

## 2020-10-08 ENCOUNTER — Telehealth: Payer: Self-pay | Admitting: *Deleted

## 2020-10-08 NOTE — Telephone Encounter (Signed)
error
